# Patient Record
Sex: Female | Born: 1965
Health system: Southern US, Community
[De-identification: ages and names within clinical notes are randomized; demographics above are authoritative.]

## PROBLEM LIST (undated history)

## (undated) DIAGNOSIS — K219 Gastro-esophageal reflux disease without esophagitis: Secondary | ICD-10-CM

## (undated) DIAGNOSIS — I10 Essential (primary) hypertension: Secondary | ICD-10-CM

## (undated) DIAGNOSIS — M2550 Pain in unspecified joint: Secondary | ICD-10-CM

## (undated) DIAGNOSIS — D649 Anemia, unspecified: Secondary | ICD-10-CM

## (undated) DIAGNOSIS — K589 Irritable bowel syndrome without diarrhea: Secondary | ICD-10-CM

## (undated) DIAGNOSIS — M549 Dorsalgia, unspecified: Secondary | ICD-10-CM

## (undated) DIAGNOSIS — L600 Ingrowing nail: Secondary | ICD-10-CM

## (undated) DIAGNOSIS — R7303 Prediabetes: Secondary | ICD-10-CM

## (undated) DIAGNOSIS — F419 Anxiety disorder, unspecified: Secondary | ICD-10-CM

## (undated) DIAGNOSIS — R600 Localized edema: Secondary | ICD-10-CM

## (undated) DIAGNOSIS — F988 Other specified behavioral and emotional disorders with onset usually occurring in childhood and adolescence: Secondary | ICD-10-CM

## (undated) DIAGNOSIS — T7840XA Allergy, unspecified, initial encounter: Secondary | ICD-10-CM

## (undated) DIAGNOSIS — T783XXA Angioneurotic edema, initial encounter: Secondary | ICD-10-CM

## (undated) DIAGNOSIS — E739 Lactose intolerance, unspecified: Secondary | ICD-10-CM

## (undated) DIAGNOSIS — E669 Obesity, unspecified: Secondary | ICD-10-CM

## (undated) DIAGNOSIS — I493 Ventricular premature depolarization: Secondary | ICD-10-CM

## (undated) DIAGNOSIS — M1990 Unspecified osteoarthritis, unspecified site: Secondary | ICD-10-CM

## (undated) DIAGNOSIS — E785 Hyperlipidemia, unspecified: Secondary | ICD-10-CM

## (undated) DIAGNOSIS — M940 Chondrocostal junction syndrome [Tietze]: Secondary | ICD-10-CM

## (undated) DIAGNOSIS — J069 Acute upper respiratory infection, unspecified: Secondary | ICD-10-CM

## (undated) DIAGNOSIS — B49 Unspecified mycosis: Secondary | ICD-10-CM

## (undated) DIAGNOSIS — L509 Urticaria, unspecified: Secondary | ICD-10-CM

## (undated) DIAGNOSIS — Z0000 Encounter for general adult medical examination without abnormal findings: Secondary | ICD-10-CM

## (undated) DIAGNOSIS — G4700 Insomnia, unspecified: Secondary | ICD-10-CM

## (undated) HISTORY — DX: Lactose intolerance, unspecified: E73.9

## (undated) HISTORY — DX: Ventricular premature depolarization: I49.3

## (undated) HISTORY — DX: Hyperlipidemia, unspecified: E78.5

## (undated) HISTORY — DX: Prediabetes: R73.03

## (undated) HISTORY — DX: Angioneurotic edema, initial encounter: T78.3XXA

## (undated) HISTORY — DX: Insomnia, unspecified: G47.00

## (undated) HISTORY — DX: Allergy, unspecified, initial encounter: T78.40XA

## (undated) HISTORY — DX: Localized edema: R60.0

## (undated) HISTORY — DX: Anxiety disorder, unspecified: F41.9

## (undated) HISTORY — DX: Essential (primary) hypertension: I10

## (undated) HISTORY — DX: Obesity, unspecified: E66.9

## (undated) HISTORY — DX: Dorsalgia, unspecified: M54.9

## (undated) HISTORY — DX: Urticaria, unspecified: L50.9

## (undated) HISTORY — DX: Pain in unspecified joint: M25.50

## (undated) HISTORY — DX: Unspecified osteoarthritis, unspecified site: M19.90

## (undated) HISTORY — DX: Chondrocostal junction syndrome (tietze): M94.0

## (undated) HISTORY — DX: Anemia, unspecified: D64.9

## (undated) HISTORY — DX: Irritable bowel syndrome, unspecified: K58.9

## (undated) HISTORY — DX: Other specified behavioral and emotional disorders with onset usually occurring in childhood and adolescence: F98.8

## (undated) HISTORY — DX: Encounter for general adult medical examination without abnormal findings: Z00.00

## (undated) HISTORY — DX: Acute upper respiratory infection, unspecified: J06.9

## (undated) HISTORY — PX: WISDOM TOOTH EXTRACTION: SHX21

## (undated) HISTORY — DX: Unspecified mycosis: B49

## (undated) HISTORY — PX: OTHER SURGICAL HISTORY: SHX169

## (undated) HISTORY — DX: Ingrowing nail: L60.0

---

## 1999-08-15 ENCOUNTER — Other Ambulatory Visit: Admission: RE | Admit: 1999-08-15 | Discharge: 1999-08-15 | Payer: Self-pay | Admitting: Obstetrics and Gynecology

## 2000-07-17 ENCOUNTER — Emergency Department (HOSPITAL_COMMUNITY): Admission: EM | Admit: 2000-07-17 | Discharge: 2000-07-17 | Payer: Self-pay | Admitting: Emergency Medicine

## 2000-09-18 ENCOUNTER — Other Ambulatory Visit: Admission: RE | Admit: 2000-09-18 | Discharge: 2000-09-18 | Payer: Self-pay | Admitting: Obstetrics and Gynecology

## 2001-10-01 ENCOUNTER — Other Ambulatory Visit: Admission: RE | Admit: 2001-10-01 | Discharge: 2001-10-01 | Payer: Self-pay | Admitting: Obstetrics and Gynecology

## 2002-10-15 ENCOUNTER — Other Ambulatory Visit: Admission: RE | Admit: 2002-10-15 | Discharge: 2002-10-15 | Payer: Self-pay | Admitting: Obstetrics and Gynecology

## 2003-12-21 ENCOUNTER — Other Ambulatory Visit: Admission: RE | Admit: 2003-12-21 | Discharge: 2003-12-21 | Payer: Self-pay | Admitting: Obstetrics and Gynecology

## 2004-01-03 ENCOUNTER — Encounter: Payer: Self-pay | Admitting: Internal Medicine

## 2004-01-03 LAB — CONVERTED CEMR LAB

## 2004-09-15 ENCOUNTER — Ambulatory Visit: Payer: Self-pay | Admitting: Internal Medicine

## 2004-10-26 ENCOUNTER — Ambulatory Visit: Payer: Self-pay | Admitting: Internal Medicine

## 2004-11-10 ENCOUNTER — Ambulatory Visit: Payer: Self-pay | Admitting: Internal Medicine

## 2004-11-17 ENCOUNTER — Ambulatory Visit: Payer: Self-pay | Admitting: Internal Medicine

## 2005-01-03 ENCOUNTER — Other Ambulatory Visit: Admission: RE | Admit: 2005-01-03 | Discharge: 2005-01-03 | Payer: Self-pay | Admitting: Obstetrics and Gynecology

## 2005-01-30 ENCOUNTER — Ambulatory Visit: Payer: Self-pay | Admitting: Internal Medicine

## 2005-06-21 ENCOUNTER — Ambulatory Visit: Payer: Self-pay | Admitting: Internal Medicine

## 2005-08-10 ENCOUNTER — Ambulatory Visit: Payer: Self-pay | Admitting: Internal Medicine

## 2005-09-07 ENCOUNTER — Ambulatory Visit: Payer: Self-pay | Admitting: Internal Medicine

## 2005-12-14 ENCOUNTER — Ambulatory Visit: Payer: Self-pay | Admitting: Internal Medicine

## 2006-07-15 ENCOUNTER — Ambulatory Visit: Payer: Self-pay | Admitting: Internal Medicine

## 2006-08-13 ENCOUNTER — Ambulatory Visit: Payer: Self-pay | Admitting: Internal Medicine

## 2006-09-11 ENCOUNTER — Ambulatory Visit: Payer: Self-pay | Admitting: Internal Medicine

## 2006-11-01 ENCOUNTER — Ambulatory Visit: Payer: Self-pay | Admitting: Endocrinology

## 2007-02-27 ENCOUNTER — Ambulatory Visit: Payer: Self-pay | Admitting: Internal Medicine

## 2007-03-26 ENCOUNTER — Encounter: Payer: Self-pay | Admitting: Internal Medicine

## 2007-03-26 DIAGNOSIS — J309 Allergic rhinitis, unspecified: Secondary | ICD-10-CM | POA: Insufficient documentation

## 2007-03-26 DIAGNOSIS — I10 Essential (primary) hypertension: Secondary | ICD-10-CM | POA: Insufficient documentation

## 2007-03-26 DIAGNOSIS — I4949 Other premature depolarization: Secondary | ICD-10-CM | POA: Insufficient documentation

## 2007-06-06 ENCOUNTER — Ambulatory Visit: Payer: Self-pay | Admitting: Internal Medicine

## 2007-09-11 ENCOUNTER — Ambulatory Visit: Payer: Self-pay | Admitting: Internal Medicine

## 2007-09-11 DIAGNOSIS — J069 Acute upper respiratory infection, unspecified: Secondary | ICD-10-CM | POA: Insufficient documentation

## 2007-09-22 LAB — CONVERTED CEMR LAB
BUN: 7 mg/dL (ref 6–23)
Chloride: 102 meq/L (ref 96–112)
Creatinine, Ser: 0.8 mg/dL (ref 0.4–1.2)
GFR calc non Af Amer: 85 mL/min
Potassium: 3.8 meq/L (ref 3.5–5.1)

## 2007-10-17 ENCOUNTER — Ambulatory Visit: Payer: Self-pay | Admitting: Internal Medicine

## 2007-10-17 DIAGNOSIS — J018 Other acute sinusitis: Secondary | ICD-10-CM | POA: Insufficient documentation

## 2008-03-25 ENCOUNTER — Ambulatory Visit: Payer: Self-pay | Admitting: Internal Medicine

## 2008-03-25 DIAGNOSIS — K644 Residual hemorrhoidal skin tags: Secondary | ICD-10-CM | POA: Insufficient documentation

## 2008-04-02 ENCOUNTER — Telehealth: Payer: Self-pay | Admitting: Internal Medicine

## 2008-04-16 ENCOUNTER — Ambulatory Visit: Payer: Self-pay | Admitting: Internal Medicine

## 2008-04-16 LAB — CONVERTED CEMR LAB
Calcium: 9.2 mg/dL (ref 8.4–10.5)
Glucose, Bld: 88 mg/dL (ref 70–99)
Potassium: 3.8 meq/L (ref 3.5–5.3)
Sodium: 143 meq/L (ref 135–145)

## 2008-04-21 ENCOUNTER — Ambulatory Visit: Payer: Self-pay | Admitting: Internal Medicine

## 2008-06-04 ENCOUNTER — Ambulatory Visit: Payer: Self-pay | Admitting: Internal Medicine

## 2008-06-04 DIAGNOSIS — J060 Acute laryngopharyngitis: Secondary | ICD-10-CM | POA: Insufficient documentation

## 2008-10-14 ENCOUNTER — Ambulatory Visit: Payer: Self-pay | Admitting: Internal Medicine

## 2008-10-14 LAB — CONVERTED CEMR LAB
BUN: 7 mg/dL (ref 6–23)
Calcium: 9.1 mg/dL (ref 8.4–10.5)
Cholesterol: 150 mg/dL (ref 0–200)
Creatinine, Ser: 0.5 mg/dL (ref 0.4–1.2)
GFR calc Af Amer: 176 mL/min
GFR calc non Af Amer: 145 mL/min
Glucose, Bld: 93 mg/dL (ref 70–99)
HDL: 46.9 mg/dL (ref >39.0)
LDL Cholesterol: 74 mg/dL (ref 0–99)
Potassium: 3.7 meq/L (ref 3.5–5.1)
VLDL: 29 mg/dL (ref 0–40)

## 2008-10-29 ENCOUNTER — Ambulatory Visit: Payer: Self-pay | Admitting: Internal Medicine

## 2008-10-29 DIAGNOSIS — E669 Obesity, unspecified: Secondary | ICD-10-CM | POA: Insufficient documentation

## 2008-11-26 ENCOUNTER — Ambulatory Visit: Payer: Self-pay | Admitting: Internal Medicine

## 2008-11-26 DIAGNOSIS — E1169 Type 2 diabetes mellitus with other specified complication: Secondary | ICD-10-CM | POA: Insufficient documentation

## 2008-11-26 DIAGNOSIS — E669 Obesity, unspecified: Secondary | ICD-10-CM | POA: Insufficient documentation

## 2009-01-11 LAB — CONVERTED CEMR LAB: Pap Smear: NORMAL

## 2009-01-20 ENCOUNTER — Ambulatory Visit: Payer: Self-pay | Admitting: Internal Medicine

## 2009-02-07 ENCOUNTER — Telehealth (INDEPENDENT_AMBULATORY_CARE_PROVIDER_SITE_OTHER): Payer: Self-pay | Admitting: *Deleted

## 2009-05-25 ENCOUNTER — Encounter: Payer: Self-pay | Admitting: Internal Medicine

## 2009-07-14 ENCOUNTER — Ambulatory Visit: Payer: Self-pay | Admitting: Internal Medicine

## 2009-07-14 LAB — CONVERTED CEMR LAB
BUN: 8 mg/dL (ref 6–23)
Calcium: 8.9 mg/dL (ref 8.4–10.5)
Cholesterol: 138 mg/dL (ref 0–200)
Creatinine, Ser: 0.74 mg/dL (ref 0.40–1.20)
Glucose, Bld: 77 mg/dL (ref 70–99)
Potassium: 4.2 meq/L (ref 3.5–5.3)
Triglycerides: 115 mg/dL (ref <150)
VLDL: 23 mg/dL (ref 0–40)

## 2009-07-21 ENCOUNTER — Ambulatory Visit: Payer: Self-pay | Admitting: Internal Medicine

## 2009-07-22 ENCOUNTER — Telehealth: Payer: Self-pay | Admitting: Internal Medicine

## 2009-08-04 ENCOUNTER — Encounter: Payer: Self-pay | Admitting: Internal Medicine

## 2009-08-05 ENCOUNTER — Encounter: Payer: Self-pay | Admitting: Internal Medicine

## 2009-12-30 ENCOUNTER — Encounter: Payer: Self-pay | Admitting: Internal Medicine

## 2010-01-03 ENCOUNTER — Telehealth: Payer: Self-pay | Admitting: Internal Medicine

## 2010-01-05 ENCOUNTER — Encounter: Payer: Self-pay | Admitting: Internal Medicine

## 2010-01-05 LAB — CONVERTED CEMR LAB
CO2: 23 meq/L (ref 19–32)
Calcium: 9 mg/dL (ref 8.4–10.5)
Chloride: 104 meq/L (ref 96–112)
Glucose, Bld: 97 mg/dL (ref 70–99)
Potassium: 4 meq/L (ref 3.5–5.3)
Sodium: 139 meq/L (ref 135–145)

## 2010-01-12 ENCOUNTER — Ambulatory Visit: Payer: Self-pay | Admitting: Internal Medicine

## 2010-04-17 ENCOUNTER — Telehealth: Payer: Self-pay | Admitting: Internal Medicine

## 2010-04-21 ENCOUNTER — Encounter: Payer: Self-pay | Admitting: Internal Medicine

## 2010-07-26 LAB — CONVERTED CEMR LAB
BUN: 8 mg/dL (ref 6–23)
CO2: 23 meq/L (ref 19–32)
Chloride: 105 meq/L (ref 96–112)
Creatinine, Ser: 0.77 mg/dL (ref 0.40–1.20)
Potassium: 3.9 meq/L (ref 3.5–5.3)

## 2010-08-03 ENCOUNTER — Ambulatory Visit: Payer: Self-pay | Admitting: Internal Medicine

## 2010-10-05 NOTE — Letter (Signed)
Summary: Generic Letter  [place] at [facility]  [address]   [place], [place] [address]   Phone: [phone]  Fax: [phone]    01/12/2010  Brandi Lester [address] [place], [place]  [address]    To whom it may concern,   Please be advised Brandi Lester has been instructed to join Weight Watchers and participate in regular exercise program for medical reasons.  Feel free to contact our office if you have any further questions regarding this matter.          Sincerely,      Dr. Thomos Lemons [facility]

## 2010-10-05 NOTE — Assessment & Plan Note (Signed)
Summary: 6 MONTH FOLLOW UP/DK   Vital Signs:  Patient profile:   45 year old female Height:      70 inches Weight:      288.50 pounds BMI:     41.55 Temp:     98.3 degrees F oral Pulse rate:   88 / minute Resp:     18 per minute BP sitting:   104 / 74  (right arm) Cuff size:   large  Vitals Entered By: Glendell Docker CMA (August 03, 2010 3:35 PM) CC: 6 month follow up Is Patient Diabetic? Yes Pain Assessment Patient in pain? no        Primary Care Provider:  Dondra Spry DO  CC:  6 month follow up.  History of Present Illness:  45 y/o AA female with hx of htn and obesity for f/u pt also has hx of borderline DM and we have been monitoring A1c recent A1c worse at 6.7 she has tried metformin in the past but stopped due to GI side effects  she is trying to exercise regularly - started water aerobics mild left wrist pain - not sure if injury occurred during water aerobics  Preventive Screening-Counseling & Management  Alcohol-Tobacco     Smoking Status: quit  Allergies: 1)  ! 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   Eyes:  pupils equal, pupils round, and pupils reactive to light.   Lungs:  normal respiratory effort and normal breath sounds.   Heart:  normal rate, regular rhythm, and no gallop.   Extremities:  No lower extremity edema    Impression & Recommendations:  Problem # 1:  DIABETES MELLITUS, TYPE II (ICD-250.00) Assessment Deteriorated pt could not tolerate metformin  in the past due to GI side effects A1c worse 6.7 trial of victoza Pt counseled on diet and exercise. carb counting handout provided pt to limit to 25 grams of carb per meal  Her updated medication list for this problem includes:    Diovan Hct 160-12.5 Mg Tabs (Valsartan-hydrochlorothiazide) ..... One by mouth qd    Victoza 18 Mg/81ml Soln (Liraglutide) ..... Inject 1.2 mg once daily  Problem # 2:  HYPERTENSION (ICD-401.9) Assessment: Unchanged  Her updated medication list for this problem includes:    Diovan Hct 160-12.5 Mg Tabs (Valsartan-hydrochlorothiazide) ..... One by mouth qd  BP today: 104/74 Prior BP: 122/70 (01/12/2010)  Labs Reviewed: K+: 3.9 (07/26/2010) Creat: : 0.77 (07/26/2010)   Chol: 138 (07/14/2009)   HDL: 50 (07/14/2009)   LDL: 65 (07/14/2009)   TG: 115 (07/14/2009)  Complete Medication List: 1)  Diovan Hct 160-12.5 Mg Tabs (Valsartan-hydrochlorothiazide) .... One by mouth qd 2)  Yaz 3-0.02 Mg Tabs (Drospirenone-ethinyl estradiol) .... Take 1 tablet by mouth once a day 3)  Zyrtec Allergy 10 Mg Tabs (Cetirizine hcl) .... Take 1 tablet by mouth once a day 4)  Nasonex 50 Mcg/act Susp (Mometasone furoate) .... 2 sprays  each nostril once daily 5)  Azelastine Hcl 137 Mcg/spray Soln (Azelastine hcl) .... 2 sprays each nostril two times a day as needed 6)  Victoza 18 Mg/49ml Soln (Liraglutide) .... Inject 1.2 mg once daily 7)  Pen Needles 5/16" 31g X 8 Mm Misc (Insulin pen needle) .... Use once daily as directed  Other Orders: Flu Vaccine 106yrs + ([address]) Admin 1st Vaccine ([address]41)  Patient Instructions: 1)  Please schedule a follow-up appointment in 3 months. Prescriptions: PEN NEEDLES 5/16" 31G X 8 MM MISC (INSULIN PEN NEEDLE) use once daily as directed  #50 x 3   Entered and Authorized by:   D. Thomos Lemons DO   Signed by:   D. Thomos Lemons DO on 08/03/2010   Method used:   Print then Give to Patient   RxID:   [id number] VICTOZA 18 MG/3ML SOLN (LIRAGLUTIDE)  inject 1.2 mg once daily  #1 month x 2   Entered and Authorized by:   D. Thomos Lemons DO   Signed by:   D. Thomos Lemons DO on 08/03/2010   Method used:   Print then Give to Patient   RxID:   [id number]    Orders Added: 1)  Flu Vaccine 20yrs + [33295] 2)  Admin 1st Vaccine [90471] 3)  Est. Patient Level III [18841]   Immunizations Administered:  Influenza Vaccine # 1:    Vaccine Type: Fluvax 3+    Site: left deltoid    Mfr: GlaxoSmithKline    Dose: 0.5 ml    Route: IM    Given by: Glendell Docker CMA    Exp. Date: 03/03/2011    Lot #: [id number]    VIS given: 03/28/10 version given August 04, 2010.  Flu Vaccine Consent Questions:    Do you have a history of severe allergic reactions to this vaccine? no    Any prior history of allergic reactions to egg and/or gelatin? no    Do you have a sensitivity to the preservative Thimersol? no    Do you have a past history of Guillan-Barre Syndrome? no    Do you currently have an acute febrile illness? no    Have you ever had a severe reaction to latex? no    Vaccine information given and explained to patient? yes    Are you currently pregnant? no   Immunizations Administered:  Influenza Vaccine # 1:    Vaccine Type: Fluvax 3+    Site: left deltoid    Mfr: GlaxoSmithKline    Dose: 0.5 ml    Route: IM    Given by: Glendell Docker CMA    Exp. Date: 03/03/2011    Lot #: [id number]    VIS given: 03/28/10 version given August 04, 2010.   Preventive Care Screening  Mammogram:    Date:  02/20/2010    Results:  normal   Pap Smear:    Date:  02/20/2010    Results:  normal    Current Allergies (reviewed today): ! PENICILLIN

## 2010-10-05 NOTE — Assessment & Plan Note (Signed)
Summary: 6 MONTH FOLLOW UP/MHF   Vital Signs:  Patient profile:   45 year old female Height:      70 inches Weight:      288.50 pounds BMI:     41.55 O2 Sat:      100 % on Room air Temp:     98.4 degrees F oral Pulse rate:   100 / minute Pulse rhythm:   regular Resp:     18 per minute BP sitting:   122 / 70  (right arm) Cuff size:   large  Vitals Entered By: Glendell Docker CMA (Jan 12, 2010 3:31 PM)  O2 Flow:  Room air CC: Rm 2- 6 Month Follow up disease management Comments note for weight watchers for inusrance purposes  fro flex pay, nasal spray for allergy Astepro   Primary Care Provider:  Dondra Spry DO  CC:  Rm 2- 6 Month Follow up disease management.  History of Present Illness:  Hypertension Follow-Up      This is a 45 year old woman who presents for Hypertension follow-up.  The patient denies lightheadedness.  Associated symptoms include exercise intolerance.  The patient denies the following associated symptoms: chest pain.  Compliance with medications (by patient report) has been near 100%.  The patient reports that dietary compliance has been good.  The patient reports exercising 3-4X per week.    She has history of allergic rhinitis and requests refill of nasal steroid and nasal antihistamine. She has tolerated well without any side effects.  Allergies: 1)  ! 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   Head:  normocephalic and atraumatic.   Neck:  supple and no masses.  no carotid  bruits.   Lungs:  normal respiratory effort and normal breath sounds.   Heart:  normal rate, regular rhythm, and no gallop.   Extremities:  No lower extremity edema  Neurologic:  cranial nerves II-XII intact and gait normal.     Impression & Recommendations:  Problem # 1:  HYPERTENSION (ICD-401.9) Assessment Unchanged blood pressure is well controlled.  Monitor electrolytes and kidney function Her updated medication list for this problem includes:    Diovan Hct 160-12.5 Mg Tabs (Valsartan-hydrochlorothiazide) ..... One by mouth qd  Future Orders: T-Basic Metabolic Panel [id number]) ... 07/24/2010 T- Hemoglobin A1C ([id number]) ... 07/24/2010  BP today: 122/70 Prior BP: 110/74 (07/21/2009)  Labs Reviewed: K+: 4.0 (01/05/2010) Creat: : 0.78 (01/05/2010)   Chol: 138 (07/14/2009)   HDL: 50 (07/14/2009)   LDL: 65 (07/14/2009)   TG: 115 (07/14/2009)  Problem # 2:  OBESITY, UNSPECIFIED (ICD-278.00) patient able to  loose 4 pounds since previous office visit.  she has started exercise program and Weight Watchers.  Problem # 3:  DIABETES MELLITUS, TYPE II, BORDERLINE (ICD-790.29) Assessment: Improved A1c has normalized to 5.6. Discontinue metformin. Continue lifestyle changes The following medications were removed from the medication list:    Metformin Hcl 500 Mg Tabs (  Metformin hcl) ..... One by mouth bid  Future Orders: T- Hemoglobin A1C ([id number]) ... 07/24/2010  Complete Medication List: 1)  Diovan Hct 160-12.5 Mg Tabs (Valsartan-hydrochlorothiazide) .... One by mouth qd 2)  Yaz 3-0.02 Mg Tabs (Drospirenone-ethinyl estradiol) .... Take 1 tablet by mouth once a day 3)  Zyrtec Allergy 10 Mg Tabs (Cetirizine hcl) .... Take 1 tablet by mouth once a day 4)  Nasonex 50 Mcg/act Susp (Mometasone furoate) .... 2 sprays each nostril once daily 5)  Azelastine Hcl 137 Mcg/spray Soln (Azelastine hcl) .... 2 sprays each nostril two times a day as needed 6)  Omeprazole 20 Mg Cpdr  (Omeprazole) .... Take 1 tablet by mouth qd  Patient Instructions: 1)  Please schedule a follow-up appointment in 6 months. 2)  BMP prior to visit, ICD-9: 401.9 3)  HbgA1C prior to visit, ICD-9: 790.29 4)  Please return for lab work one (1) week before your next appointment.  Prescriptions: NASONEX 50 MCG/ACT SUSP (MOMETASONE FUROATE) 2 sprays each nostril once daily  #1 x 3   Entered and Authorized by:   D. Thomos Lemons DO   Signed by:   D. Thomos Lemons DO on 01/12/2010   Method used:   Electronically to        [facility] (retail)       [address]       [place], [place]  [address]       Ph: [phone]       Fax: [phone]   RxID:   [id number] AZELASTINE HCL 137 MCG/SPRAY SOLN (AZELASTINE HCL) 2 sprays each nostril two times a day as needed  #1 x 3   Entered and Authorized by:   D. Thomos Lemons DO   Signed by:   D. Thomos Lemons DO on 01/12/2010   Method used:   Electronically to        [facility] (retail)       [address]       [place], [place]  [address]       Ph: [phone]       Fax: [phone]   RxID:   [id number]   Current Allergies (reviewed today): ! PENICILLIN

## 2010-10-05 NOTE — Progress Notes (Signed)
Summary: Labwork question  Phone Note Call from Patient Call back at [phone]   Caller: Patient Summary of Call: Pt needs to know if she can eat before her 01/05/10 labwork Initial call taken by: [name],  Jan 03, 2010 10:31 AM  Follow-up for Phone Call        Left message on machine that pt. needs to be fasting for labwork appt.  [name] CMA  Jan 03, 2010 11:44 AM

## 2010-10-05 NOTE — Miscellaneous (Signed)
Summary: Lab Orders  Clinical Lists Changes  Orders: Added new Test order of T-Basic Metabolic Panel ([id number]) - Signed Added new Test order of T- Hemoglobin A1C ([id number]) - Signed 

## 2010-10-05 NOTE — Medication Information (Signed)
Summary: Interaction Between [facility]  Interaction Between [facility]   Imported By: Lanelle Bal 01/09/2010 13:07:07  _____________________________________________________________________  External Attachment:    Type:   Image     Comment:   External Document

## 2010-10-05 NOTE — Progress Notes (Signed)
Summary: Problems with [facility]  Phone Note Call from Patient Call back at [phone]   Caller: Patient Summary of Call: Pt does not want ANY Rx's sent to [facility], she is having problems with them, pls send all future Rx's to [facility], she said there will probably be a refill request coming in from [facility] today, do not send it back, pls send Rx to [facility] Initial call taken by: [name],  April 17, 2010 4:20 PM  Follow-up for Phone Call        noted Follow-up by: Glendell Docker CMA,  April 18, 2010 8:22 AM

## 2010-10-05 NOTE — Letter (Signed)
[facility]   Imported By: Lanelle Bal 05/05/2010 10:40:18  _____________________________________________________________________  External Attachment:    Type:   Image     Comment:   External Document

## 2010-11-03 ENCOUNTER — Ambulatory Visit (INDEPENDENT_AMBULATORY_CARE_PROVIDER_SITE_OTHER): Payer: BC Managed Care – PPO | Admitting: Internal Medicine

## 2010-11-03 ENCOUNTER — Encounter: Payer: Self-pay | Admitting: Internal Medicine

## 2010-11-03 DIAGNOSIS — I10 Essential (primary) hypertension: Secondary | ICD-10-CM

## 2010-11-03 DIAGNOSIS — E119 Type 2 diabetes mellitus without complications: Secondary | ICD-10-CM

## 2010-11-03 DIAGNOSIS — R110 Nausea: Secondary | ICD-10-CM

## 2010-11-03 LAB — CONVERTED CEMR LAB
BUN: 8 mg/dL (ref 6–23)
Chloride: 104 meq/L (ref 96–112)
Creatinine, Urine: 145.5 mg/dL
Glucose, Bld: 94 mg/dL (ref 70–99)
Hgb A1c MFr Bld: 5.8 % — ABNORMAL HIGH (ref <5.7)
Potassium: 3.5 meq/L (ref 3.5–5.3)
Sodium: 137 meq/L (ref 135–145)

## 2010-11-06 ENCOUNTER — Encounter: Payer: Self-pay | Admitting: Internal Medicine

## 2010-11-09 NOTE — Assessment & Plan Note (Signed)
Summary: 3 Month Follow up    Vital Signs:  Patient profile:   45 year old female Height:      70 inches Weight:      275.25 pounds BMI:     39.64 O2 Sat:      93 % on Room air Temp:     98.5 degrees F oral Pulse rate:   93 / minute Resp:     18 per minute BP sitting:   114 / 70  (left arm) Cuff size:   large  Vitals Entered By: Glendell Docker CMA (November 03, 2010 4:01 PM)  O2 Flow:  Room air CC: 3 Month Follow up  Is Patient Diabetic? Yes Pain Assessment Patient in pain? no        Primary Care Provider:  Dondra Spry DO  CC:  3 Month Follow up .  History of Present Illness: 45 year old female for followup Patient was recently seen at urgent care and diagnosed with strep throat She has mild residual symptoms despite completing antibiotics She denies neck pain or fever or chills. She has penicillin allergy.  Patient treated with azithromycin. Patient complains of mild left ear pain  Diabetes mellitus type 2-patient has lost approximately 15 pounds since previous visit The patient experienced some nausea with eating heavy meals while taking victoza She denies vomiting She denies severe abdominal pain  hypertension-stable  Preventive Screening-Counseling & Management  Alcohol-Tobacco     Smoking Status: quit  Allergies: 1)  ! Penicillin  Past History:  Past Medical History: Allergic rhinitis  Hypertension   Obesity       DM II   Past Surgical History: LASIK eye surgery           Family History: Sister with massive MI at age 68 Multiple siblings with hypertension Mother deceased secondary to MI age 45 Father deceased with history of prostate cancer          Physical Exam  General:  alert, well-developed, and well-nourished.   Ears:  left and right tympanic membranes normal Mouth:  pharyngeal erythema, no exudates.   Neck:  No deformities, masses, or tenderness noted. Lungs:  normal respiratory effort and normal breath sounds.   Heart:  normal  rate, regular rhythm, and no gallop.     Impression & Recommendations:  Problem # 1:  DIABETES MELLITUS, TYPE II (ICD-250.00) Assessment Improved patient having mild nausea with those. This is improved with dietary adjustment.   Rule out pancreatitis  Her updated medication list for this problem includes:    Diovan Hct 160-12.5 Mg Tabs (Valsartan-hydrochlorothiazide) ..... One by mouth qd    Victoza 18 Mg/91ml Soln (Liraglutide) ..... Inject 1.2 mg once daily  Orders: T-Basic Metabolic Panel [id number]) T- Hemoglobin A1C ([id number]) T-Urine Microalbumin w/creat. ratio [id number])  Problem # 2:  HYPERTENSION (ICD-401.9) Assessment: Unchanged  Her updated medication list for this problem includes:    Diovan Hct 160-12.5 Mg Tabs (Valsartan-hydrochlorothiazide) ..... One by mouth qd  BP today: 114/70 Prior BP: 104/74 (08/03/2010)  Labs Reviewed: K+: 3.9 (07/26/2010) Creat: : 0.77 (07/26/2010)   Chol: 138 (07/14/2009)   HDL: 50 (07/14/2009)   LDL: 65 (07/14/2009)   TG: 115 (07/14/2009)  Problem # 3:  NAUSEA (ICD-787.02)  Orders: T-Lipase ([id number])  Problem # 4:  ACUTE LARYNGOPHARYNGITIS (ICD-465.0) Patient recently seen in urgent care for strep throat Rapid strep is negative Patient not having any fever or neck pain Continue symptomatic care-gargle with warm salt water twice a day Patient advised  to call office if symptoms persist or worsen.  Complete Medication List: 1)  Diovan Hct 160-12.5 Mg Tabs (Valsartan-hydrochlorothiazide) .... One by mouth qd 2)  Yaz 3-0.02 Mg Tabs (Drospirenone-ethinyl estradiol) .... Take 1 tablet by mouth once a day 3)  Zyrtec Allergy 10 Mg Tabs (Cetirizine hcl) .... Take 1 tablet by mouth once a day 4)  Nasonex 50 Mcg/act Susp (Mometasone furoate) .... 2 sprays each nostril once daily 5)  Azelastine Hcl 137 Mcg/spray Soln (Azelastine hcl) .... 2 sprays each nostril two times a day as needed 6)  Victoza 18 Mg/54ml Soln  (Liraglutide) .... Inject 1.2 mg once daily 7)  Pen Needles 5/16" 31g X 8 Mm Misc (Insulin pen needle) .... Use once daily as directed  Patient Instructions: 1)  Please schedule a follow-up appointment in 4 months. 2)  BMP prior to visit, ICD-9: 401.9 3)  HbgA1C prior to visit, ICD-9: 250.00 4)  High sensitivity CRP :  250.00 5)  Please return for lab work one (1) week before your next appointment.  Prescriptions: PEN NEEDLES 5/16" 31G X 8 MM MISC (INSULIN PEN NEEDLE) use once daily as directed  #100 x 1   Entered and Authorized by:   D. Thomos Lemons DO   Signed by:   D. Thomos Lemons DO on 11/03/2010   Method used:   Electronically to        [facility] (retail)       [address]       [place], [place]  [address]       Ph: [phone]       Fax: [phone]   RxID:   [id number] VICTOZA 18 MG/3ML SOLN (LIRAGLUTIDE) inject 1.2 mg once daily  #1 month x 5   Entered and Authorized by:   D. Thomos Lemons DO   Signed by:   D. Thomos Lemons DO on 11/03/2010   Method used:   Electronically to        [facility] (retail)       [address]       [place], [place]  [address]       Ph: [phone]       Fax: [phone]   RxID:   [id number] DIOVAN HCT 160-12.5 MG TABS (VALSARTAN-HYDROCHLOROTHIAZIDE) one by mouth qd  #90 Tablet x 1   Entered and Authorized by:   D. Thomos Lemons DO   Signed by:   D. Thomos Lemons DO on 11/03/2010   Method used:   Electronically to        [facility] (retail)       [address]       [place], [place]  [address]       Ph: [phone]       Fax: [phone]   RxID:   [id number]    Orders Added: 1)  T-Basic Metabolic Panel [id number] 2)  T- Hemoglobin A1C [[id number]] 3)  T-Urine Microalbumin w/creat. ratio [[id number]] 4)  T-Lipase [[address]3690-23215] 5)  Est. Patient Level III [82993]    Current Allergies (reviewed today): !  PENICILLIN

## 2010-11-14 NOTE — Letter (Signed)
   [place] at [facility] [address] [place], [place]  [address]  [place] Phone: [phone]      November 06, 2010   [facility] [address] [place], [place] [address]  RE:  LAB RESULTS  Dear  Ms. Decarli,  The following is an interpretation of your most recent lab tests.  Please take note of any instructions provided or changes to medications that have resulted from your lab work.  ELECTROLYTES:  Good - no changes needed  KIDNEY FUNCTION TESTS:  Good - no changes needed   DIABETIC STUDIES:  Improved - continue management Blood Glucose: 94   HgbA1C: 5.8   Microalbumin/Creatinine Ratio: 3.4          Sincerely Yours,    Dr. Thomos Lemons  Appended Document:  mailed

## 2011-01-30 ENCOUNTER — Ambulatory Visit (INDEPENDENT_AMBULATORY_CARE_PROVIDER_SITE_OTHER): Payer: BC Managed Care – PPO | Admitting: Family

## 2011-01-30 ENCOUNTER — Encounter: Payer: Self-pay | Admitting: Family

## 2011-01-30 DIAGNOSIS — H6690 Otitis media, unspecified, unspecified ear: Secondary | ICD-10-CM

## 2011-01-30 DIAGNOSIS — H6692 Otitis media, unspecified, left ear: Secondary | ICD-10-CM

## 2011-01-30 DIAGNOSIS — J329 Chronic sinusitis, unspecified: Secondary | ICD-10-CM | POA: Insufficient documentation

## 2011-01-30 MED ORDER — LEVOFLOXACIN 500 MG PO TABS: 500.0000 mg | ORAL_TABLET | Freq: Every day | ORAL | Status: AC

## 2011-01-30 NOTE — Assessment & Plan Note (Signed)
Failed a z-pack.  Will treat with levaquin.

## 2011-01-30 NOTE — Assessment & Plan Note (Signed)
Will treat with levaquin.  I suspect her "vertigo" will resolve once OM is resolved.

## 2011-01-30 NOTE — Progress Notes (Signed)
  Subjective:    Patient ID: Brandi Brandi Lester, female    DOB: 1966/05/18, 45 y.o.   MRN: [id number]  HPI Ms.  Brandi Lester is a 45 yr old female who presents today with chief complaint of left ear fullness/difficulty hearing out of the left ear.  Symptoms started last Tuesday.  [facility] and treated with z-pak.  She completed abx on Saturday.  Still having pain/pressure- tooth pain and left ear fullness.  She has also been taking zyrtec D without improvement.  Now having "vertigo."     Review of Systems    see HPI  Past Medical History  Diagnosis Date  . Allergy     allergic rhinitis  . Hypertension   . Diabetes mellitus     Type II  . Obesity     History   Social History  . Marital Status: Single    Spouse Name: N/A    Number of Children: N/A  . Years of Education: N/A   Occupational History  . Not on file.   Social History Main Topics  . Smoking status: Former Games developer  . Smokeless tobacco: Not on file  . Alcohol Use: Yes     rare  . Drug Use: Not on file  . Sexually Active: Not on file   Other Topics Concern  . Not on file   Social History Narrative  . No narrative on file    Past Surgical History  Procedure Date  . Lasik     eye surgery    Family History  Problem Relation Age of Onset  . Heart attack Mother 49  . Cancer Father     prostate  . Heart attack Sister 41  . Hypertension Other     Allergies  Allergen Reactions  . Penicillins     REACTION: lip tingling    No current outpatient prescriptions on file prior to visit.    BP 100/60  Pulse 84  Temp(Src) 97.9 F (36.6 C) (Oral)  Resp 16  Wt 266 lb 1.3 oz (120.693 kg)  SpO2 99%    Objective:   Physical Exam Gen: awake, alert, NAD Head:  + frontal and maxillary tenderness to palpation- worst over the left frontal area.  Ears: R TM normal, left TM pink and bulging.   Neck: supple without cervical adenopathy or JVC Cv: s1/s2, rrr Resp: BS CTA  bilaterally, no wheezes/rales or rhonchi       Assessment & Plan:

## 2011-01-30 NOTE — Patient Instructions (Addendum)
Call if your symptoms worsen or if they are not improved in 48-72 hours.  

## 2011-02-05 ENCOUNTER — Telehealth: Payer: Self-pay | Admitting: Internal Medicine

## 2011-02-05 DIAGNOSIS — H6690 Otitis media, unspecified, unspecified ear: Secondary | ICD-10-CM

## 2011-02-05 NOTE — Telephone Encounter (Signed)
Pt states congestion has cleared but dizziness is the same and still has popping sound and fullness in her left ear. Thinks her ear may be only slightly better.  Still has 1 antibiotic left to take. Please advise.

## 2011-02-05 NOTE — Telephone Encounter (Signed)
She is still dizzy and still has a clicking in her ear and cant really hear out of the left ear.  She has one more antibiotic pill left what should she do

## 2011-02-05 NOTE — Telephone Encounter (Signed)
Please let patient know that I am referring her to ENT.

## 2011-02-05 NOTE — Telephone Encounter (Signed)
Left detailed message re: instructions below an to call if any questions.

## 2011-03-05 ENCOUNTER — Ambulatory Visit: Payer: BC Managed Care – PPO | Admitting: Internal Medicine

## 2011-03-08 ENCOUNTER — Other Ambulatory Visit: Payer: Self-pay | Admitting: *Deleted

## 2011-03-08 DIAGNOSIS — I10 Essential (primary) hypertension: Secondary | ICD-10-CM

## 2011-03-08 DIAGNOSIS — E119 Type 2 diabetes mellitus without complications: Secondary | ICD-10-CM

## 2011-03-08 LAB — BASIC METABOLIC PANEL
BUN: 11 mg/dL (ref 6–23)
CO2: 25 mEq/L (ref 19–32)
Calcium: 9.2 mg/dL (ref 8.4–10.5)
Creat: 0.77 mg/dL (ref 0.50–1.10)
Glucose, Bld: 86 mg/dL (ref 70–99)

## 2011-03-09 LAB — HEMOGLOBIN A1C: Hgb A1c MFr Bld: 5.9 % — ABNORMAL HIGH (ref <5.7)

## 2011-03-12 ENCOUNTER — Other Ambulatory Visit: Payer: Self-pay | Admitting: Internal Medicine

## 2011-03-15 ENCOUNTER — Ambulatory Visit (INDEPENDENT_AMBULATORY_CARE_PROVIDER_SITE_OTHER): Payer: BC Managed Care – PPO | Admitting: Internal Medicine

## 2011-03-15 ENCOUNTER — Encounter: Payer: Self-pay | Admitting: Internal Medicine

## 2011-03-15 ENCOUNTER — Ambulatory Visit: Payer: BC Managed Care – PPO | Admitting: Internal Medicine

## 2011-03-15 DIAGNOSIS — E119 Type 2 diabetes mellitus without complications: Secondary | ICD-10-CM

## 2011-03-15 DIAGNOSIS — E669 Obesity, unspecified: Secondary | ICD-10-CM

## 2011-03-15 DIAGNOSIS — R42 Dizziness and giddiness: Secondary | ICD-10-CM

## 2011-03-15 DIAGNOSIS — I10 Essential (primary) hypertension: Secondary | ICD-10-CM

## 2011-03-15 MED ORDER — MECLIZINE HCL 25 MG PO TABS: 25.0000 mg | ORAL_TABLET | Freq: Three times a day (TID) | ORAL | Status: AC | PRN

## 2011-03-15 NOTE — Patient Instructions (Signed)
Please schedule cbc, chem7, a1c, tsh (250.0) and lipid,lft, crp (272.0) prior to next visit

## 2011-03-18 DIAGNOSIS — R42 Dizziness and giddiness: Secondary | ICD-10-CM | POA: Insufficient documentation

## 2011-03-18 NOTE — Assessment & Plan Note (Addendum)
Excellent control. Encouraged weight loss.obtain fasting lipid profile with next labs

## 2011-03-18 NOTE — Assessment & Plan Note (Signed)
Long discussion held. Encouraged efforts for dietary modification, regular aerobic exercise and weight loss.

## 2011-03-18 NOTE — Assessment & Plan Note (Signed)
Normotensive and stable. Continue current regimen. 

## 2011-03-18 NOTE — Assessment & Plan Note (Signed)
Resolved. Antivert in future if needed. Prescription provided

## 2011-03-18 NOTE — Progress Notes (Signed)
  Subjective:    Patient ID: Brandi Lester, female    DOB: 02-Feb-1966, 45 y.o.   MRN: [id number]  HPI Patient presents to clinic for evaluation of multiple medical problems. Her pulse sinus infection and may involve the left ear. Took 2 courses of antibiotics and developed  vertigo. Ultimately  saw ENT who prescribed Nasonex. Vertigo resolved. Wonders what she can take if has future episodes. Blood pressure reviewed under good control. Uses Victoza for diabetes and A1c reviewed under excellent control. CRP elevated however no corresponding lipid profile. Not currently on statin therapy. No other complaints.  Reviewed past medical history, medications and allergies    Review of Systems  Constitutional: Negative for fever, chills and fatigue.  HENT: Negative for hearing loss, ear pain and tinnitus.   Neurological: Negative for dizziness and light-headedness.       Objective:   Physical Exam    Physical Exam  Vitals reviewed. Constitutional:  appears well-developed and well-nourished. No distress.  HENT:  Head: Normocephalic and atraumatic.  Right Ear: Tympanic membrane, external ear and ear canal normal.  Left Ear: Tympanic membrane, external ear and ear canal normal.  Nose: Nose normal.  Mouth/Throat: Oropharynx is clear and moist. No oropharyngeal exudate.  Eyes: Conjunctivae and EOM are normal. Pupils are equal, round, and reactive to light. Right eye exhibits no discharge. Left eye exhibits no discharge. No scleral icterus.  Neck: Neck supple. No thyromegaly present.  Cardiovascular: Normal rate, regular rhythm and normal heart sounds.  Exam reveals no gallop and no friction rub.   No murmur heard. Pulmonary/Chest: Effort normal and breath sounds normal. No respiratory distress.  has no wheezes.  has no rales.  Lymphadenopathy:   no cervical adenopathy.  Neurological:  is alert.  Skin: Skin is warm and dry.  not diaphoretic.  Psychiatric: normal mood and affect.       Assessment & Plan:

## 2011-06-07 ENCOUNTER — Other Ambulatory Visit: Payer: Self-pay | Admitting: Internal Medicine

## 2011-06-07 NOTE — Telephone Encounter (Signed)
Rx refill sent to pharmacy. 

## 2011-06-14 ENCOUNTER — Other Ambulatory Visit: Payer: Self-pay | Admitting: Internal Medicine

## 2011-06-14 DIAGNOSIS — E7800 Pure hypercholesterolemia, unspecified: Secondary | ICD-10-CM

## 2011-06-14 DIAGNOSIS — E119 Type 2 diabetes mellitus without complications: Secondary | ICD-10-CM

## 2011-06-14 LAB — BASIC METABOLIC PANEL
Calcium: 9.1 mg/dL (ref 8.4–10.5)
Glucose, Bld: 90 mg/dL (ref 70–99)
Potassium: 3.6 mEq/L (ref 3.5–5.3)
Sodium: 141 mEq/L (ref 135–145)

## 2011-06-14 LAB — CBC
Hemoglobin: 12.8 g/dL (ref 12.0–15.0)
MCH: 28.6 pg (ref 26.0–34.0)
MCHC: 34.3 g/dL (ref 30.0–36.0)
Platelets: 276 10*3/uL (ref 150–400)

## 2011-06-14 LAB — HEPATIC FUNCTION PANEL
ALT: 12 U/L (ref 0–35)
AST: 13 U/L (ref 0–37)
Alkaline Phosphatase: 69 U/L (ref 39–117)
Bilirubin, Direct: 0.1 mg/dL (ref 0.0–0.3)
Indirect Bilirubin: 0.3 mg/dL (ref 0.0–0.9)

## 2011-06-14 LAB — HEMOGLOBIN A1C
Hgb A1c MFr Bld: 5.9 % — ABNORMAL HIGH (ref <5.7)
Mean Plasma Glucose: 123 mg/dL — ABNORMAL HIGH (ref <117)

## 2011-06-14 LAB — LIPID PANEL
Cholesterol: 151 mg/dL (ref 0–200)
Total CHOL/HDL Ratio: 4.3 Ratio

## 2011-06-14 LAB — TSH: TSH: 0.679 u[IU]/mL (ref 0.350–4.500)

## 2011-06-19 ENCOUNTER — Ambulatory Visit (INDEPENDENT_AMBULATORY_CARE_PROVIDER_SITE_OTHER): Payer: BC Managed Care – PPO | Admitting: Internal Medicine

## 2011-06-19 ENCOUNTER — Encounter: Payer: Self-pay | Admitting: Internal Medicine

## 2011-06-19 DIAGNOSIS — H9209 Otalgia, unspecified ear: Secondary | ICD-10-CM

## 2011-06-19 DIAGNOSIS — E119 Type 2 diabetes mellitus without complications: Secondary | ICD-10-CM

## 2011-06-23 DIAGNOSIS — H9209 Otalgia, unspecified ear: Secondary | ICD-10-CM | POA: Insufficient documentation

## 2011-06-23 NOTE — Progress Notes (Signed)
  Subjective:    Patient ID: Brandi Lester, female    DOB: 1966-05-10, 45 y.o.   MRN: [id number]  HPI Pt presents to clinic for followup of multiple medical problems. Notes 3d h/o left ear pain without trauma or drainage. Had lg temperature 99.4. +clear nasal drainage. H/o mild dm using victoza. Has questions about possible long term side effects from medication. Has not lost wt with medication. Last two a1c's 5.9. Does not have glucometer to check fsbs. Chart review indicates dx of dm ~ 1 year ago with a1c 6.7 and nl fasting glucoses. Otherwise a1c's have ranged 5.6-6.0. No other complaints.  Past Medical History  Diagnosis Date  . Allergy     allergic rhinitis  . Hypertension   . Diabetes mellitus     Type II  . Obesity    Past Surgical History  Procedure Date  . Lasik     eye surgery    reports that she has quit smoking. She has never used smokeless tobacco. She reports that she drinks alcohol. Her drug history not on file. family history includes Cancer in her father; Heart attack (age of onset:52) in her sister; Heart attack (age of onset:65) in her mother; and Hypertension in her other. Allergies  Allergen Reactions  . Penicillins     REACTION: lip tingling     Review of Systems see hpi     Objective:   Physical Exam  Nursing note and vitals reviewed. Constitutional: She appears well-developed and well-nourished. No distress.  HENT:  Head: Normocephalic and atraumatic.  Right Ear: Tympanic membrane, external ear and ear canal normal.  Left Ear: Tympanic membrane, external ear and ear canal normal.  Nose: Nose normal.  Mouth/Throat: Oropharynx is clear and moist. No oropharyngeal exudate.  Eyes: Conjunctivae are normal. No scleral icterus.  Neck: Neck supple.  Cardiovascular: Normal rate, regular rhythm and normal heart sounds.  Exam reveals no gallop and no friction rub.   No murmur heard. Pulmonary/Chest: Effort normal and breath sounds normal. No respiratory  distress. She has no wheezes. She has no rales.  Neurological: She is alert.  Skin: Skin is warm and dry. She is not diaphoretic.       Diabetic foot exam: no diabetic wounds, ulceration or significant callousing. +2 dp pulse bilaterally. Monofilament nl bilaterally.  Psychiatric: She has a normal mood and affect.          Assessment & Plan:

## 2011-06-23 NOTE — Assessment & Plan Note (Signed)
Decrease victoza to 0.6 and if glucose remains under good control then dc. Teach fsbs. Prescription for glucometer/test strips/lancet for fsbs fasting qam. Strongly encourage diabetic diet, regular aerobic exercise and wt loss.

## 2011-06-23 NOTE — Assessment & Plan Note (Signed)
Most c/w eustacian tube dysfxn. Treat underlying drainage with AH. Followup if no improvement or worsening.

## 2011-07-17 ENCOUNTER — Ambulatory Visit: Payer: BC Managed Care – PPO | Admitting: Internal Medicine

## 2011-07-24 ENCOUNTER — Ambulatory Visit (INDEPENDENT_AMBULATORY_CARE_PROVIDER_SITE_OTHER): Payer: BC Managed Care – PPO | Admitting: Internal Medicine

## 2011-07-24 ENCOUNTER — Encounter: Payer: Self-pay | Admitting: Internal Medicine

## 2011-07-24 VITALS — BP 120/80 | HR 72 | Temp 97.9°F | Resp 16 | Wt 289.1 lb

## 2011-07-24 DIAGNOSIS — E119 Type 2 diabetes mellitus without complications: Secondary | ICD-10-CM

## 2011-07-24 NOTE — Patient Instructions (Signed)
Please schedule chem7, a1c 250.0 prior to next appointment 

## 2011-07-28 NOTE — Progress Notes (Signed)
  Subjective:    Patient ID: Brandi Lester, female    DOB: [birth year], 46 y.o.   MRN: [id number]  HPI Pt presents to clinic for followup of multiple medical problems. Has made diet changes and victoza dose reduced to 0.6. Last a1c prior to changes was 5.9. Previously intolerant of metformin. BP reviewed as normotensive. Previous ear pain last visit now resolved. No active complaint.  Past Medical History  Diagnosis Date  . Allergy     allergic rhinitis  . Hypertension   . Diabetes mellitus     Type II  . Obesity    Past Surgical History  Procedure Date  . Lasik     eye surgery    reports that she has quit smoking. She has never used smokeless tobacco. She reports that she drinks alcohol. Her drug history not on file. family history includes Cancer in her father; Heart attack (age of onset:52) in her sister; Heart attack (age of onset:65) in her mother; and Hypertension in her other. Allergies  Allergen Reactions  . Penicillins     REACTION: lip tingling     Review of Systems see hpi     Objective:   Physical Exam  Nursing note and vitals reviewed. Constitutional: She appears well-developed and well-nourished. No distress.  HENT:  Head: Normocephalic and atraumatic.  Neurological: She is alert.  Skin: She is not diaphoretic.  Psychiatric: She has a normal mood and affect.          Assessment & Plan:

## 2011-07-28 NOTE — Assessment & Plan Note (Signed)
DC victoza (no wt loss and pt concerned over potential side effects). Continue dietary modification and exercise. Monitor fsbs and if becomes hyperglycemic consider low dose [place] or onglyza.

## 2011-08-17 ENCOUNTER — Other Ambulatory Visit: Payer: Self-pay | Admitting: Internal Medicine

## 2011-08-17 NOTE — Telephone Encounter (Signed)
Rx refill sent to pharmacy. 

## 2011-09-17 ENCOUNTER — Encounter: Payer: Self-pay | Admitting: Internal Medicine

## 2011-09-17 ENCOUNTER — Ambulatory Visit (INDEPENDENT_AMBULATORY_CARE_PROVIDER_SITE_OTHER): Payer: BC Managed Care – PPO | Admitting: Internal Medicine

## 2011-09-17 ENCOUNTER — Encounter: Payer: Self-pay | Admitting: *Deleted

## 2011-09-17 VITALS — BP 126/84 | HR 99 | Temp 98.9°F | Resp 18 | Wt 287.0 lb

## 2011-09-17 DIAGNOSIS — R509 Fever, unspecified: Secondary | ICD-10-CM

## 2011-09-17 DIAGNOSIS — K5289 Other specified noninfective gastroenteritis and colitis: Secondary | ICD-10-CM

## 2011-09-17 DIAGNOSIS — J069 Acute upper respiratory infection, unspecified: Secondary | ICD-10-CM

## 2011-09-17 DIAGNOSIS — K529 Noninfective gastroenteritis and colitis, unspecified: Secondary | ICD-10-CM

## 2011-09-17 MED ORDER — PROMETHAZINE HCL 25 MG PO TABS: 25.0000 mg | ORAL_TABLET | Freq: Four times a day (QID) | ORAL | Status: AC | PRN

## 2011-09-21 DIAGNOSIS — K529 Noninfective gastroenteritis and colitis, unspecified: Secondary | ICD-10-CM | POA: Insufficient documentation

## 2011-09-21 NOTE — Assessment & Plan Note (Signed)
Flu swab neg. Suspect viral etiology. Maintain adequate po fluid intake.  Attempt phenergan prn-cautioned re possible sedation. Work note provided. Followup if no improvement or worsening.

## 2011-09-21 NOTE — Progress Notes (Signed)
  Subjective:    Patient ID: Brandi Lester, female    DOB: Feb 22, 1966, 46 y.o.   MRN: [id number]  HPI Pt presents to clinic for evaluation of vomitting. Notes 2 day h/o vomitting, abdominal cramping and loose watery diarrhea without blood. Has fever with tmax 101 and head congestion. Taking no medication for the problem. No alleviating or exacerbating factors. No other complaints.  Past Medical History  Diagnosis Date  . Allergy     allergic rhinitis  . Hypertension   . Diabetes mellitus     Type II  . Obesity    Past Surgical History  Procedure Date  . Lasik     eye surgery    reports that she has quit smoking. She has never used smokeless tobacco. She reports that she drinks alcohol. Her drug history not on file. family history includes Cancer in her father; Heart attack (age of onset:52) in her sister; Heart attack (age of onset:65) in her mother; and Hypertension in her other. Allergies  Allergen Reactions  . Penicillins     REACTION: lip tingling     Review of Systems see hpi     Objective:   Physical Exam  Nursing note and vitals reviewed. Constitutional: She appears well-developed and well-nourished. No distress.  HENT:  Head: Normocephalic.  Mouth/Throat: Oropharynx is clear and moist. No oropharyngeal exudate.  Eyes: Conjunctivae are normal. No scleral icterus.  Abdominal: Soft. Bowel sounds are normal. She exhibits no distension and no mass. There is no tenderness. There is no rebound and no guarding.  Neurological: She is alert.  Skin: Skin is warm and dry. She is not diaphoretic.  Psychiatric: She has a normal mood and affect.          Assessment & Plan:

## 2011-09-24 ENCOUNTER — Other Ambulatory Visit: Payer: Self-pay | Admitting: Internal Medicine

## 2011-09-24 NOTE — Telephone Encounter (Signed)
Rx refill sent to pharmacy. 

## 2011-09-25 ENCOUNTER — Ambulatory Visit (INDEPENDENT_AMBULATORY_CARE_PROVIDER_SITE_OTHER): Payer: BC Managed Care – PPO | Admitting: Internal Medicine

## 2011-09-25 ENCOUNTER — Encounter: Payer: Self-pay | Admitting: Internal Medicine

## 2011-09-25 VITALS — BP 124/70 | HR 103 | Temp 98.5°F | Resp 20

## 2011-09-25 DIAGNOSIS — J329 Chronic sinusitis, unspecified: Secondary | ICD-10-CM

## 2011-09-25 MED ORDER — DOXYCYCLINE HYCLATE 100 MG PO TABS: 100.0000 mg | ORAL_TABLET | Freq: Two times a day (BID) | ORAL | Status: AC

## 2011-09-29 DIAGNOSIS — J329 Chronic sinusitis, unspecified: Secondary | ICD-10-CM | POA: Insufficient documentation

## 2011-09-29 NOTE — Progress Notes (Signed)
  Subjective:    Patient ID: Brandi Lester, female    DOB: 1966-04-13, 46 y.o.   MRN: [id number]  HPI Pt presents to clinic for evaluation of nasal congestion. Note ~10d h/o yellow nasal drainage, congestion, headaches and sinus pain. Taking mucinex with some improvement. Uses nasacort and netti pot. No other alleviating or exacerbating factors. No other complaints.  Past Medical History  Diagnosis Date  . Allergy     allergic rhinitis  . Hypertension   . Diabetes mellitus     Type II  . Obesity    Past Surgical History  Procedure Date  . Lasik     eye surgery    reports that she has quit smoking. She has never used smokeless tobacco. She reports that she drinks alcohol. Her drug history not on file. family history includes Cancer in her father; Heart attack (age of onset:52) in her sister; Heart attack (age of onset:65) in her mother; and Hypertension in her other. Allergies  Allergen Reactions  . Penicillins     REACTION: lip tingling     Review of Systems see hpi      Objective:   Physical Exam  Nursing note and vitals reviewed. Constitutional: She appears well-developed and well-nourished. No distress.  HENT:  Head: Normocephalic and atraumatic.  Right Ear: Tympanic membrane, external ear and ear canal normal.  Left Ear: Tympanic membrane and external ear normal.  Nose: Right sinus exhibits maxillary sinus tenderness.  Mouth/Throat: Oropharynx is clear and moist. No oropharyngeal exudate.  Eyes: Conjunctivae are normal. No scleral icterus.  Neck: Neck supple.  Skin: She is not diaphoretic.          Assessment & Plan:

## 2011-09-29 NOTE — Assessment & Plan Note (Signed)
Attempt abx course. Followup if no improvement or worsening.  

## 2011-10-25 ENCOUNTER — Ambulatory Visit: Payer: BC Managed Care – PPO | Admitting: Internal Medicine

## 2011-11-09 ENCOUNTER — Ambulatory Visit: Payer: BC Managed Care – PPO | Admitting: Internal Medicine

## 2011-11-29 ENCOUNTER — Other Ambulatory Visit: Payer: Self-pay | Admitting: Internal Medicine

## 2011-11-29 DIAGNOSIS — E119 Type 2 diabetes mellitus without complications: Secondary | ICD-10-CM

## 2011-11-30 LAB — BASIC METABOLIC PANEL
BUN: 9 mg/dL (ref 6–23)
Calcium: 9.2 mg/dL (ref 8.4–10.5)
Creat: 0.7 mg/dL (ref 0.50–1.10)
Glucose, Bld: 82 mg/dL (ref 70–99)

## 2011-11-30 LAB — HEMOGLOBIN A1C: Hgb A1c MFr Bld: 6.2 % — ABNORMAL HIGH (ref <5.7)

## 2011-12-07 ENCOUNTER — Ambulatory Visit (INDEPENDENT_AMBULATORY_CARE_PROVIDER_SITE_OTHER): Payer: BC Managed Care – PPO | Admitting: Internal Medicine

## 2011-12-07 ENCOUNTER — Telehealth: Payer: Self-pay | Admitting: Internal Medicine

## 2011-12-07 ENCOUNTER — Encounter: Payer: Self-pay | Admitting: Internal Medicine

## 2011-12-07 VITALS — BP 130/82 | HR 87 | Temp 98.9°F | Resp 18 | Ht 70.0 in | Wt 292.0 lb

## 2011-12-07 DIAGNOSIS — Z13220 Encounter for screening for lipoid disorders: Secondary | ICD-10-CM

## 2011-12-07 DIAGNOSIS — E119 Type 2 diabetes mellitus without complications: Secondary | ICD-10-CM

## 2011-12-07 DIAGNOSIS — IMO0001 Reserved for inherently not codable concepts without codable children: Secondary | ICD-10-CM

## 2011-12-07 DIAGNOSIS — M7918 Myalgia, other site: Secondary | ICD-10-CM

## 2011-12-07 DIAGNOSIS — I10 Essential (primary) hypertension: Secondary | ICD-10-CM

## 2011-12-07 MED ORDER — VALSARTAN-HYDROCHLOROTHIAZIDE 160-12.5 MG PO TABS: 1.0000 | ORAL_TABLET | Freq: Every day | ORAL | Status: DC

## 2011-12-07 NOTE — Assessment & Plan Note (Signed)
Good control off medication. Focus on diabetic diet, exercise and wt loss.

## 2011-12-07 NOTE — Assessment & Plan Note (Signed)
Attempt aleve with food and no other nsaids. Followup if no improvement or worsening.

## 2011-12-07 NOTE — Progress Notes (Signed)
  Subjective:    Patient ID: Brandi Lester, female    DOB: 12-May-1966, 46 y.o.   MRN: [id number]  HPI Pt presents to clinic for followup of multiple medical problems. Recently stepped wrong on a treadmill while exercising and pulled right low back and right knee. Did not fall. Occurred ~1 month ago. No radicular pain or knee instability. Continues to exercise regularly. Diabetic control reviewed at target despite dc of victoza last visit.   Past Medical History  Diagnosis Date  . Allergy     allergic rhinitis  . Hypertension   . Diabetes mellitus     Type II  . Obesity    Past Surgical History  Procedure Date  . Lasik     eye surgery    reports that she has quit smoking. She has never used smokeless tobacco. She reports that she drinks alcohol. Her drug history not on file. family history includes Cancer in her father; Heart attack (age of onset:52) in her sister; Heart attack (age of onset:65) in her mother; and Hypertension in her other. Allergies  Allergen Reactions  . Penicillins     REACTION: lip tingling      Review of Systems see hpi     Objective:   Physical Exam  Physical Exam  Nursing note and vitals reviewed. Constitutional: Appears well-developed and well-nourished. No distress.  HENT:  Head: Normocephalic and atraumatic.  Right Ear: External ear normal.  Left Ear: External ear normal.  Eyes: Conjunctivae are normal. No scleral icterus.  Neck: Neck supple. Carotid bruit is not present.  Cardiovascular: Normal rate, regular rhythm and normal heart sounds.  Exam reveals no gallop and no friction rub.   No murmur heard. Pulmonary/Chest: Effort normal and breath sounds normal. No respiratory distress. He has no wheezes. no rales.  Lymphadenopathy:    He has no cervical adenopathy.  Neurological:Alert.  Skin: Skin is warm and dry. Not diaphoretic.  Psychiatric: Has a normal mood and affect.        Assessment & Plan:

## 2011-12-07 NOTE — Telephone Encounter (Signed)
Lab orders entered for August 2013. 

## 2011-12-07 NOTE — Patient Instructions (Signed)
Please schedule fasting labs prior to next visit Cbc, chem7, a1c, urine microalbumin, lipid 250.0

## 2011-12-07 NOTE — Assessment & Plan Note (Signed)
Normotensive and stable. Continue current regimen. Monitor bp as outpt and followup in clinic as scheduled.  

## 2012-04-07 LAB — CBC
MCH: 28.6 pg (ref 26.0–34.0)
MCHC: 35.3 g/dL (ref 30.0–36.0)
MCV: 81 fL (ref 78.0–100.0)
Platelets: 294 10*3/uL (ref 150–400)
RBC: 4.48 MIL/uL (ref 3.87–5.11)
RDW: 14.4 % (ref 11.5–15.5)

## 2012-04-07 LAB — BASIC METABOLIC PANEL
CO2: 27 mEq/L (ref 19–32)
Calcium: 9.1 mg/dL (ref 8.4–10.5)
Creat: 0.66 mg/dL (ref 0.50–1.10)
Sodium: 140 mEq/L (ref 135–145)

## 2012-04-07 NOTE — Addendum Note (Signed)
Addended by: [name] A on: 04/07/2012 11:03 AM   Modules accepted: Orders

## 2012-04-07 NOTE — Telephone Encounter (Signed)
Pt presented to the lab, orders released. 

## 2012-04-08 LAB — LIPID PANEL
LDL Cholesterol: 91 mg/dL (ref 0–99)
Triglycerides: 111 mg/dL (ref <150)
VLDL: 22 mg/dL (ref 0–40)

## 2012-04-08 LAB — HEMOGLOBIN A1C
Hgb A1c MFr Bld: 5.9 % — ABNORMAL HIGH (ref <5.7)
Mean Plasma Glucose: 123 mg/dL — ABNORMAL HIGH (ref <117)

## 2012-04-08 LAB — MICROALBUMIN / CREATININE URINE RATIO
Creatinine, Urine: 331.6 mg/dL
Microalb, Ur: 1.1 mg/dL (ref 0.00–1.89)

## 2012-04-11 ENCOUNTER — Encounter: Payer: Self-pay | Admitting: Internal Medicine

## 2012-04-11 ENCOUNTER — Ambulatory Visit (INDEPENDENT_AMBULATORY_CARE_PROVIDER_SITE_OTHER): Payer: BC Managed Care – PPO | Admitting: Internal Medicine

## 2012-04-11 VITALS — BP 124/88 | HR 104 | Temp 98.7°F | Resp 16 | Wt 300.5 lb

## 2012-04-11 DIAGNOSIS — E669 Obesity, unspecified: Secondary | ICD-10-CM

## 2012-04-11 DIAGNOSIS — I10 Essential (primary) hypertension: Secondary | ICD-10-CM

## 2012-04-11 DIAGNOSIS — E119 Type 2 diabetes mellitus without complications: Secondary | ICD-10-CM

## 2012-04-11 NOTE — Patient Instructions (Signed)
Please schedule labs prior to next visit Chem7, a1c-250.0 

## 2012-04-11 NOTE — Progress Notes (Signed)
  Subjective:    Patient ID: Brandi Lester, female    DOB: 02-15-1966, 46 y.o.   MRN: [id number]  HPI Pt presents to clinic for followup of multiple medical problems. Is attempting diet change and regular exercise. Weight up since last visit. Wishes to begin weight watchers program. Blood sugar control and hdl chol improved. No active complaint.  Past Medical History  Diagnosis Date  . Allergy     allergic rhinitis  . Hypertension   . Diabetes mellitus     Type II  . Obesity    Past Surgical History  Procedure Date  . Lasik     eye surgery    reports that she has quit smoking. She has never used smokeless tobacco. She reports that she drinks alcohol. Her drug history not on file. family history includes Cancer in her father; Heart attack (age of onset:52) in her sister; Heart attack (age of onset:65) in her mother; and Hypertension in her other. Allergies  Allergen Reactions  . Penicillins     REACTION: lip tingling      Review of Systems see hpi     Objective:   Physical Exam  Physical Exam  Nursing note and vitals reviewed. Constitutional: Appears well-developed and well-nourished. No distress.  HENT:  Head: Normocephalic and atraumatic.  Right Ear: External ear normal.  Left Ear: External ear normal.  Eyes: Conjunctivae are normal. No scleral icterus.  Neck: Neck supple. Carotid bruit is not present.  Cardiovascular: Normal rate, regular rhythm and normal heart sounds.  Exam reveals no gallop and no friction rub.   No murmur heard. Pulmonary/Chest: Effort normal and breath sounds normal. No respiratory distress. He has no wheezes. no rales.  Lymphadenopathy:    He has no cervical adenopathy.  Neurological:Alert.  Skin: Skin is warm and dry. Not diaphoretic.  Psychiatric: Has a normal mood and affect.        Assessment & Plan:

## 2012-04-12 NOTE — Assessment & Plan Note (Signed)
Normotensive and stable. Continue current regimen. Monitor bp as outpt and followup in clinic as scheduled.  

## 2012-04-12 NOTE — Assessment & Plan Note (Signed)
Improving and excellent control. Weight loss attempts encouraged.

## 2012-04-12 NOTE — Assessment & Plan Note (Signed)
Letter provided for weight watcher's program to fall under healthcare flex spending account

## 2012-06-03 DIAGNOSIS — B49 Unspecified mycosis: Secondary | ICD-10-CM

## 2012-06-03 DIAGNOSIS — L600 Ingrowing nail: Secondary | ICD-10-CM | POA: Insufficient documentation

## 2012-06-03 HISTORY — DX: Unspecified mycosis: B49

## 2012-06-03 HISTORY — DX: Ingrowing nail: L60.0

## 2012-08-04 ENCOUNTER — Telehealth: Payer: Self-pay | Admitting: *Deleted

## 2012-08-04 DIAGNOSIS — E119 Type 2 diabetes mellitus without complications: Secondary | ICD-10-CM

## 2012-08-04 LAB — BASIC METABOLIC PANEL
Calcium: 9 mg/dL (ref 8.4–10.5)
Glucose, Bld: 91 mg/dL (ref 70–99)
Potassium: 4 mEq/L (ref 3.5–5.3)
Sodium: 139 mEq/L (ref 135–145)

## 2012-08-04 LAB — HEMOGLOBIN A1C
Hgb A1c MFr Bld: 5.9 % — ABNORMAL HIGH (ref <5.7)
Mean Plasma Glucose: 123 mg/dL — ABNORMAL HIGH (ref <117)

## 2012-08-04 NOTE — Telephone Encounter (Signed)
Pt presented to the lab. Orders entered per 04/2012 office note as below.  Please schedule labs prior to next visit  Chem7, a1c-250.0

## 2012-08-11 ENCOUNTER — Encounter: Payer: Self-pay | Admitting: Internal Medicine

## 2012-08-11 ENCOUNTER — Ambulatory Visit (INDEPENDENT_AMBULATORY_CARE_PROVIDER_SITE_OTHER): Payer: BC Managed Care – PPO | Admitting: Internal Medicine

## 2012-08-11 VITALS — BP 132/88 | HR 108 | Temp 98.6°F | Resp 12 | Wt 299.1 lb

## 2012-08-11 DIAGNOSIS — I10 Essential (primary) hypertension: Secondary | ICD-10-CM

## 2012-08-11 DIAGNOSIS — Z23 Encounter for immunization: Secondary | ICD-10-CM

## 2012-08-11 DIAGNOSIS — E119 Type 2 diabetes mellitus without complications: Secondary | ICD-10-CM

## 2012-08-11 MED ORDER — INFLUENZA VIRUS VACC SPLIT PF IM SUSP: 0.5000 mL | INTRAMUSCULAR | Status: DC

## 2012-08-11 NOTE — Assessment & Plan Note (Signed)
Normotensive and stable. Continue current regimen. Monitor bp as outpt and followup in clinic as scheduled.  

## 2012-08-11 NOTE — Progress Notes (Signed)
  Subjective:    Patient ID: Brandi Lester, female    DOB: Sep 20, 1965, 46 y.o.   MRN: [id number]  HPI Pt presents to clinic for followup of multiple medical problems. Reviewed a1c stable at 5.9. Weight down one pound since last visit. Was exercising however had two separate foot problems that temporarily derailed her efforts. Plans on resuming exercise soon.   Past Medical History  Diagnosis Date  . Allergy     allergic rhinitis  . Hypertension   . Diabetes mellitus     Type II  . Obesity   . Fungus infection 10/13    Left great toe  . Ingrown nail 10/13    right foot next to the last toe   Past Surgical History  Procedure Date  . Lasik     eye surgery    reports that she has quit smoking. She has never used smokeless tobacco. She reports that she drinks alcohol. Her drug history not on file. family history includes Cancer in her father; Heart attack (age of onset:52) in her sister; Heart attack (age of onset:65) in her mother; and Hypertension in her other. Allergies  Allergen Reactions  . Penicillins     REACTION: lip tingling      Review of Systems see hpi     Objective:   Physical Exam  Physical Exam  Nursing note and vitals reviewed. Constitutional: Appears well-developed and well-nourished. No distress.  HENT:  Head: Normocephalic and atraumatic.  Right Ear: External ear normal.  Left Ear: External ear normal.  Eyes: Conjunctivae are normal. No scleral icterus.  Neck: Neck supple. Carotid bruit is not present.  Cardiovascular: Normal rate, regular rhythm and normal heart sounds.  Exam reveals no gallop and no friction rub.   No murmur heard. Pulmonary/Chest: Effort normal and breath sounds normal. No respiratory distress. He has no wheezes. no rales.  Lymphadenopathy:    He has no cervical adenopathy.  Neurological:Alert.  Skin: Skin is warm and dry. Not diaphoretic.  Psychiatric: Has a normal mood and affect.  Diabetic foot exam: +2 DP pulses, no  diabetic wounds, ulcerations or significant callousing. Monofilament exam nl.       Assessment & Plan:

## 2012-08-11 NOTE — Assessment & Plan Note (Signed)
Good control. Encouraged exercise and weight loss.

## 2012-08-20 ENCOUNTER — Ambulatory Visit (INDEPENDENT_AMBULATORY_CARE_PROVIDER_SITE_OTHER): Payer: BC Managed Care – PPO | Admitting: Family

## 2012-08-20 ENCOUNTER — Encounter: Payer: Self-pay | Admitting: Family

## 2012-08-20 VITALS — BP 118/78 | HR 107 | Temp 98.6°F | Resp 16 | Wt 299.0 lb

## 2012-08-20 DIAGNOSIS — J329 Chronic sinusitis, unspecified: Secondary | ICD-10-CM

## 2012-08-20 MED ORDER — AZITHROMYCIN 250 MG PO TABS: ORAL_TABLET | ORAL | Status: DC

## 2012-08-20 NOTE — Patient Instructions (Addendum)

## 2012-08-20 NOTE — Assessment & Plan Note (Signed)
Will rx with zithromax.   

## 2012-08-20 NOTE — Progress Notes (Signed)
Subjective:    Patient ID: Brandi Lester, female    DOB: 29-Jan-1966, 46 y.o.   MRN: [id number]  HPI  Sore throat started last Thursday.  She has associated nasal drainage- yellow.  Also having diarrhea. She has associated hoarseness.  Has been using sudafed but drainage remains thick. She denies known fever.  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    Review of Systems See HPI  Past Medical History  Diagnosis Date  . Allergy     allergic rhinitis  . Hypertension   . Diabetes mellitus     Type II  . Obesity   . Fungus infection 10/13    Left great toe  . Ingrown nail 10/13    right foot next to the last toe    History   Social History  . Marital Status: Single    Spouse Name: N/A    Number of Children: N/A  . Years of Education: N/A   Occupational History  . Not on file.   Social History Main Topics  . Smoking status: Former Games developer  . Smokeless tobacco: Never Used  . Alcohol Use: Yes     Comment: rare  . Drug Use: Not on file  . Sexually Active: Not on file   Other Topics Concern  . Not on file   Social History Narrative  . No narrative on file    Past Surgical History  Procedure Date  . Lasik     eye surgery    Family History  Problem Relation Age of Onset  . Heart attack Mother 60  . Cancer Father     prostate  . Heart attack Sister 8  . Hypertension Other     Allergies  Allergen Reactions  . Penicillins     REACTION: lip tingling    Current Outpatient Prescriptions on File Prior to Visit  Medication Sig Dispense Refill  . azelastine (ASTELIN) 137 MCG/SPRAY nasal spray Place 2 sprays into both nostrils 2 (two) times daily. Use in each nostril as directed       . cetirizine (ZYRTEC) 10 MG tablet Take 10 mg by mouth daily.        [name] Epinastine HCl 0.05 % ophthalmic solution Place 1 drop into both eyes as needed.      [name] HYDROcodone-acetaminophen (NORCO) 7.5-325 MG per tablet       . mometasone (NASONEX) 50 MCG/ACT nasal  spray Place 2 sprays into the nose daily as needed.      [name] omeprazole (PRILOSEC) 20 MG capsule Take 1 capsule (20 mg total) by mouth daily.  90 capsule  1  . valsartan-hydrochlorothiazide (DIOVAN-HCT) 160-12.5 MG per tablet Take 1 tablet by mouth daily.  90 tablet  2    BP 118/78  Pulse 107  Temp 98.6 F (37 C) (Oral)  Resp 16  Wt 299 lb 0.6 oz (135.644 kg)  SpO2 97%       Objective:   Physical Exam  Constitutional: She is oriented to person, place, and time. She appears well-developed and well-nourished. No distress.  HENT:  Head: Normocephalic and atraumatic.  Right Ear: Tympanic membrane and ear canal normal.  Left Ear: Tympanic membrane and ear canal normal.  Mouth/Throat: No oropharyngeal exudate, posterior oropharyngeal edema or posterior oropharyngeal erythema.       Hoarse voice  Cardiovascular: Normal rate and regular rhythm.   No murmur heard. Pulmonary/Chest: Effort normal and breath sounds normal. No respiratory distress. She has no  wheezes. She has no rales. She exhibits no tenderness.  Musculoskeletal: She exhibits no edema.  Lymphadenopathy:    She has no cervical adenopathy.  Neurological: She is alert and oriented to person, place, and time.  Skin: Skin is warm and dry.  Psychiatric: She has a normal mood and affect. Her behavior is normal. Judgment and thought content normal.          Assessment & Plan:

## 2012-09-13 ENCOUNTER — Other Ambulatory Visit: Payer: Self-pay | Admitting: Internal Medicine

## 2012-09-15 NOTE — Telephone Encounter (Signed)
Rx to pharmacy/[facility] 

## 2012-09-24 ENCOUNTER — Other Ambulatory Visit: Payer: Self-pay | Admitting: Internal Medicine

## 2012-12-11 ENCOUNTER — Ambulatory Visit: Payer: BC Managed Care – PPO | Admitting: Family Medicine

## 2012-12-15 ENCOUNTER — Ambulatory Visit (INDEPENDENT_AMBULATORY_CARE_PROVIDER_SITE_OTHER): Payer: BC Managed Care – PPO | Admitting: Family

## 2012-12-15 ENCOUNTER — Encounter: Payer: Self-pay | Admitting: Family

## 2012-12-15 VITALS — BP 150/84 | HR 99 | Temp 98.3°F | Resp 16 | Wt 302.0 lb

## 2012-12-15 DIAGNOSIS — I10 Essential (primary) hypertension: Secondary | ICD-10-CM

## 2012-12-15 DIAGNOSIS — E119 Type 2 diabetes mellitus without complications: Secondary | ICD-10-CM

## 2012-12-15 LAB — HEMOGLOBIN A1C
Hgb A1c MFr Bld: 5.8 % — ABNORMAL HIGH (ref <5.7)
Mean Plasma Glucose: 120 mg/dL — ABNORMAL HIGH (ref <117)

## 2012-12-15 LAB — BASIC METABOLIC PANEL
BUN: 7 mg/dL (ref 6–23)
Creat: 0.79 mg/dL (ref 0.50–1.10)
Glucose, Bld: 111 mg/dL — ABNORMAL HIGH (ref 70–99)

## 2012-12-15 MED ORDER — AMLODIPINE BESYLATE 5 MG PO TABS: 5.0000 mg | ORAL_TABLET | Freq: Every day | ORAL | Status: DC

## 2012-12-15 NOTE — Patient Instructions (Addendum)
Please complete your lab work prior to leaving. Follow up as scheduled on 4/28.

## 2012-12-15 NOTE — Progress Notes (Signed)
Subjective:    Patient ID: Brandi Lester, female    DOB: 1966-06-26, 47 y.o.   MRN: [id number]  HPI  Ms.  Shugars is a 47 yr old female who presents today with chief complaint of elevated blood pressure readings. She has hx of HTN and is maintained on diovan-hct 160-12.5. Took today.  She reports 160/102 at allergist and got similar readings at home.  She reports nasal congestion (using plain allegra and flonase). She follows with Dr. Sharyn Lull. Denies CP SOB or LE edma.   Review of Systems    see HPI  Past Medical History  Diagnosis Date  . Allergy     allergic rhinitis  . Hypertension   . Diabetes mellitus     Type II  . Obesity   . Fungus infection 10/13    Left great toe  . Ingrown nail 10/13    right foot next to the last toe    History   Social History  . Marital Status: Single    Spouse Name: N/A    Number of Children: N/A  . Years of Education: N/A   Occupational History  . Not on file.   Social History Main Topics  . Smoking status: Former Games developer  . Smokeless tobacco: Never Used  . Alcohol Use: Yes     Comment: rare  . Drug Use: Not on file  . Sexually Active: Not on file   Other Topics Concern  . Not on file   Social History Narrative  . No narrative on file    Past Surgical History  Procedure Laterality Date  . Lasik      eye surgery    Family History  Problem Relation Age of Onset  . Heart attack Mother 50  . Cancer Father     prostate  . Heart attack Sister 69  . Hypertension Other     Allergies  Allergen Reactions  . Penicillins     REACTION: lip tingling    Current Outpatient Prescriptions on File Prior to Visit  Medication Sig Dispense Refill  . mometasone (NASONEX) 50 MCG/ACT nasal spray Place 2 sprays into the nose daily as needed.      [name] omeprazole (PRILOSEC) 20 MG capsule TAKE 1 CAPSULE DAILY  90 capsule  1  . valsartan-hydrochlorothiazide (DIOVAN-HCT) 160-12.5 MG per tablet TAKE 1 TABLET DAILY  90 tablet  1  . azelastine  (ASTELIN) 137 MCG/SPRAY nasal spray Place 2 sprays into both nostrils 2 (two) times daily. Use in each nostril as directed       . Epinastine HCl 0.05 % ophthalmic solution Place 1 drop into both eyes as needed.       No current facility-administered medications on file prior to visit.    BP 150/84  Pulse 99  Temp(Src) 98.3 F (36.8 C) (Oral)  Resp 16  Wt 302 lb 0.6 oz (137.004 kg)  BMI 43.34 kg/m2  SpO2 99%    Objective:   Physical Exam  Constitutional: She is oriented to person, place, and time. She appears well-developed and well-nourished. No distress.  HENT:  Head: Normocephalic and atraumatic.  Cardiovascular: Normal rate and regular rhythm.   No murmur heard. Pulmonary/Chest: Effort normal and breath sounds normal. No respiratory distress. She has no wheezes.  Musculoskeletal: She exhibits no edema.  Neurological: She is alert and oriented to person, place, and time.  Psychiatric: She has a normal mood and affect. Her behavior is normal. Judgment and thought content normal.  Assessment & Plan:

## 2012-12-16 ENCOUNTER — Encounter: Payer: Self-pay | Admitting: Family

## 2012-12-16 LAB — MICROALBUMIN / CREATININE URINE RATIO: Microalb Creat Ratio: 14.2 mg/g (ref 0.0–30.0)

## 2012-12-17 NOTE — Assessment & Plan Note (Signed)
Deteriorated. Cont diovan hct, add amlodipine.

## 2012-12-29 ENCOUNTER — Encounter: Payer: Self-pay | Admitting: Family Medicine

## 2012-12-29 ENCOUNTER — Ambulatory Visit (INDEPENDENT_AMBULATORY_CARE_PROVIDER_SITE_OTHER): Payer: BC Managed Care – PPO | Admitting: Family Medicine

## 2012-12-29 VITALS — BP 130/68 | HR 100 | Temp 98.5°F | Ht 70.0 in | Wt 302.5 lb

## 2012-12-29 DIAGNOSIS — M6580 Other synovitis and tenosynovitis, unspecified site: Secondary | ICD-10-CM

## 2012-12-29 DIAGNOSIS — I10 Essential (primary) hypertension: Secondary | ICD-10-CM

## 2012-12-29 DIAGNOSIS — E119 Type 2 diabetes mellitus without complications: Secondary | ICD-10-CM

## 2012-12-29 DIAGNOSIS — T7840XD Allergy, unspecified, subsequent encounter: Secondary | ICD-10-CM

## 2012-12-29 DIAGNOSIS — E669 Obesity, unspecified: Secondary | ICD-10-CM

## 2012-12-29 DIAGNOSIS — J309 Allergic rhinitis, unspecified: Secondary | ICD-10-CM

## 2012-12-29 DIAGNOSIS — Z5189 Encounter for other specified aftercare: Secondary | ICD-10-CM

## 2012-12-29 DIAGNOSIS — M778 Other enthesopathies, not elsewhere classified: Secondary | ICD-10-CM

## 2012-12-29 MED ORDER — NAPROXEN 375 MG PO TABS: ORAL_TABLET | ORAL | Status: DC

## 2012-12-29 MED ORDER — FEXOFENADINE HCL 180 MG PO TABS: 180.0000 mg | ORAL_TABLET | Freq: Every day | ORAL | Status: DC | PRN

## 2012-12-29 MED ORDER — AMLODIPINE BESYLATE 5 MG PO TABS: 5.0000 mg | ORAL_TABLET | Freq: Every day | ORAL | Status: DC

## 2012-12-29 MED ORDER — FLUTICASONE PROPIONATE 50 MCG/ACT NA SUSP: 2.0000 | Freq: Every day | NASAL | Status: DC | PRN

## 2012-12-29 NOTE — Assessment & Plan Note (Signed)
Improved control with addition of Amlodipine, given a 90 days supply today

## 2012-12-29 NOTE — Progress Notes (Signed)
Patient ID: Brandi Lester, female   DOB: [birth year], 47 y.o.   MRN: [id number] LAMONA EIMER [id number] 1965/11/21 12/29/2012      Progress Note-Follow Up  Subjective  Chief Complaint  Chief Complaint  Patient presents with  . Follow-up    HPI  Patient is a 47 year old [place] American female who is in today for followup. Her first complaint is right elbow pain. She says it's been going on for a week and a half. She was at work and picked up a chair and felt a popping sensation just caudal to her elbow. She said some mild swelling but no redness or warmth. Pain is worse with rotation of the arm and lifting. She has been using Aleve when necessary with pain relief temporarily. No similar previous injury. No radicular symptoms down the arm. Otherwise she feels well. She has no allergies flared and is requesting a refill on Flonase. No chest pain or palpitations. No shortness of breath GI or GU concerns noted.  Past Medical History  Diagnosis Date  . Allergy     allergic rhinitis  . Hypertension   . Diabetes mellitus     Type II  . Obesity   . Fungus infection 10/13    Left great toe  . Ingrown nail 10/13    right foot next to the last toe    Past Surgical History  Procedure Laterality Date  . Lasik      eye surgery    Family History  Problem Relation Age of Onset  . Heart attack Mother 60  . Cancer Father     prostate  . Heart attack Sister 31  . Hypertension Other     History   Social History  . Marital Status: Single    Spouse Name: N/A    Number of Children: N/A  . Years of Education: N/A   Occupational History  . Not on file.   Social History Main Topics  . Smoking status: Former Games developer  . Smokeless tobacco: Never Used  . Alcohol Use: Yes     Comment: rare  . Drug Use: Not on file  . Sexually Active: Not on file   Other Topics Concern  . Not on file   Social History Narrative  . No narrative on file    Current Outpatient Prescriptions on  File Prior to Visit  Medication Sig Dispense Refill  . azelastine (ASTELIN) 137 MCG/SPRAY nasal spray Place 2 sprays into both nostrils 2 (two) times daily. Use in each nostril as directed       . Epinastine HCl 0.05 % ophthalmic solution Place 1 drop into both eyes as needed.      [name] omeprazole (PRILOSEC) 20 MG capsule TAKE 1 CAPSULE DAILY  90 capsule  1  . valsartan-hydrochlorothiazide (DIOVAN-HCT) 160-12.5 MG per tablet TAKE 1 TABLET DAILY  90 tablet  1   No current facility-administered medications on file prior to visit.    Allergies  Allergen Reactions  . Penicillins     REACTION: lip tingling    Review of Systems  Review of Systems  Constitutional: Negative for fever and malaise/fatigue.  HENT: Positive for congestion.   Eyes: Negative for discharge.  Respiratory: Negative for shortness of breath.   Cardiovascular: Negative for chest pain, palpitations and leg swelling.  Gastrointestinal: Negative for nausea, abdominal pain and diarrhea.  Genitourinary: Negative for dysuria.  Musculoskeletal: Negative for falls.  Skin: Negative for rash.  Neurological: Negative for loss of consciousness and headaches.  Endo/Heme/Allergies: Negative for polydipsia.  Psychiatric/Behavioral: Negative for depression and suicidal ideas. The patient is not nervous/anxious and does not have insomnia.     Objective  BP 130/68  Pulse 100  Temp(Src) 98.5 F (36.9 C) (Oral)  Ht 5\' 10"  (1.778 m)  Wt 302 lb 8 oz (137.213 kg)  BMI 43.4 kg/m2  SpO2 98%  Physical Exam  Physical Exam  Constitutional: She is oriented to person, place, and time and well-developed, well-nourished, and in no distress. No distress.  HENT:  Head: Normocephalic and atraumatic.  Eyes: Conjunctivae are normal.  Neck: Neck supple. No thyromegaly present.  Cardiovascular: Normal rate, regular rhythm and normal heart sounds.   No murmur heard. Pulmonary/Chest: Effort normal and breath sounds normal. She has no wheezes.   Abdominal: She exhibits no distension and no mass.  Musculoskeletal: She exhibits no edema.  Lymphadenopathy:    She has no cervical adenopathy.  Neurological: She is alert and oriented to person, place, and time.  Skin: Skin is warm and dry. No rash noted. She is not diaphoretic.  Psychiatric: Memory, affect and judgment normal.    Lab Results  Component Value Date   TSH 0.679 06/14/2011   Lab Results  Component Value Date   WBC 6.5 04/07/2012   HGB 12.8 04/07/2012   HCT 36.3 04/07/2012   MCV 81.0 04/07/2012   PLT 294 04/07/2012   Lab Results  Component Value Date   CREATININE 0.79 12/15/2012   BUN 7 12/15/2012   NA 139 12/15/2012   K 3.5 12/15/2012   CL 102 12/15/2012   CO2 29 12/15/2012   Lab Results  Component Value Date   ALT 12 06/14/2011   AST 13 06/14/2011   ALKPHOS 69 06/14/2011   BILITOT 0.4 06/14/2011   Lab Results  Component Value Date   CHOL 152 04/07/2012   Lab Results  Component Value Date   HDL 39* 04/07/2012   Lab Results  Component Value Date   LDLCALC 91 04/07/2012   Lab Results  Component Value Date   TRIG 111 04/07/2012   Lab Results  Component Value Date   CHOLHDL 3.9 04/07/2012     Assessment & Plan  HYPERTENSION Improved control with addition of Amlodipine, given a 90 days supply today  DIABETES MELLITUS, TYPE II hgba1c 5.8, minimize simple carbs  OBESITY, UNSPECIFIED Encouraged DASH diet and increased exercise  ALLERGIC RHINITIS Given refill on Flonase and Fexofenadine today

## 2012-12-29 NOTE — Assessment & Plan Note (Signed)
Encouraged DASH diet and increased exercise 

## 2012-12-29 NOTE — Assessment & Plan Note (Signed)
hgba1c 5.8, minimize simple carbs. 

## 2012-12-29 NOTE — Assessment & Plan Note (Signed)
Given refill on Flonase and Fexofenadine today

## 2012-12-29 NOTE — Patient Instructions (Addendum)
Next visit annual, lipid, renal, hepatic, cbc, tsh, hgba1c DASH Diet The DASH diet stands for "Dietary Approaches to Stop Hypertension." It is a healthy eating plan that has been shown to reduce high blood pressure (hypertension) in as little as 14 days, while also possibly providing other significant health benefits. These other health benefits include reducing the risk of breast cancer after menopause and reducing the risk of type 2 diabetes, heart disease, colon cancer, and stroke. Health benefits also include weight loss and slowing kidney failure in patients with chronic kidney disease.  DIET GUIDELINES  Limit salt (sodium). Your diet should contain less than 1500 mg of sodium daily.  Limit refined or processed carbohydrates. Your diet should include mostly whole grains. Desserts and added sugars should be used sparingly.  Include small amounts of heart-healthy fats. These types of fats include nuts, oils, and tub margarine. Limit saturated and trans fats. These fats have been shown to be harmful in the body. CHOOSING FOODS  The following food groups are based on a 2000 calorie diet. See your Registered Dietitian for individual calorie needs. Grains and Grain Products (6 to 8 servings daily)  Eat More Often: Whole-wheat bread, brown rice, whole-grain or wheat pasta, quinoa, popcorn without added fat or salt (air popped).  Eat Less Often: White bread, white pasta, white rice, cornbread. Vegetables (4 to 5 servings daily)  Eat More Often: Fresh, frozen, and canned vegetables. Vegetables may be raw, steamed, roasted, or grilled with a minimal amount of fat.  Eat Less Often/Avoid: Creamed or fried vegetables. Vegetables in a cheese sauce. Fruit (4 to 5 servings daily)  Eat More Often: All fresh, canned (in natural juice), or frozen fruits. Dried fruits without added sugar. One hundred percent fruit juice ( cup [237 mL] daily).  Eat Less Often: Dried fruits with added sugar. Canned fruit  in light or heavy syrup. [employer], Fish, and Poultry (2 servings or less daily. One serving is 3 to 4 oz [85-114 g]).  Eat More Often: Ninety percent or leaner ground beef, tenderloin, sirloin. Round cuts of beef, chicken breast, [place] breast. All fish. Grill, bake, or broil your meat. Nothing should be fried.  Eat Less Often/Avoid: Fatty cuts of meat, [place], or chicken leg, thigh, or wing. Fried cuts of meat or fish. Dairy (2 to 3 servings)  Eat More Often: Low-fat or fat-free milk, low-fat plain or light yogurt, reduced-fat or part-skim cheese.  Eat Less Often/Avoid: Milk (whole, 2%).Whole milk yogurt. Full-fat cheeses. Nuts, Seeds, and Legumes (4 to 5 servings per week)  Eat More Often: All without added salt.  Eat Less Often/Avoid: Salted nuts and seeds, canned beans with added salt. Fats and Sweets (limited)  Eat More Often: Vegetable oils, tub margarines without trans fats, sugar-free gelatin. Mayonnaise and salad dressings.  Eat Less Often/Avoid: Coconut oils, palm oils, butter, stick margarine, cream, half and half, cookies, candy, pie. FOR MORE INFORMATION The Dash Diet Eating Plan: www.dashdiet.org Document Released: 08/09/2011 Document Revised: 11/12/2011 Document Reviewed: 08/09/2011 [facility] Patient Information 2013 [place], [place].  Ice and Aspercreme bo arm twice a day and as needed No heavy lifting for 2 weeks, report if no improvement for further referral     Bicipital Tendonitis Bicipital tendonitis refers to redness, soreness, and swelling (inflammation) or irritation of the bicep tendon. The biceps muscle is located between the elbow and shoulder of the inner arm. The tendon heads, similar to pieces of rope, connect the bicep muscle to the shoulder socket. They are called  short head and long head tendons. When tendonitis occurs, the long head tendon is inflamed and swollen, and may be thickened or partially torn.  Bicipital tendonitis can occur with other  problems as well, such as arthritis in the shoulder or acromioclavicular joints, tears in the tendons, or other rotator cuff problems.  CAUSES  Overuse of of the arms for overhead activities is the major cause of tendonitis. Many athletes, such as swimmers, baseball players, and tennis players are prone to bicipital tendonitis. Jobs that require manual labor or routine chores, especially chores involving overhead activities can result in overuse and tendonitis. SYMPTOMS Symptoms may include:  Pain in and around the front of the shoulder. Pain may be worse with overhead motion.  Pain or aching that radiates down the arm.  Clicking or shifting sensations in the shoulder. DIAGNOSIS Your caregiver may perform the following:  Physical exam and tests of the biceps and shoulder to observe range of motion, strength, and stability.  X-rays or magnetic resonance imaging (MRI) to confirm the diagnosis. In most common cases, these tests are not necessary. Since other problems may exist in the shoulder or rotator cuff, additional tests may be recommended. TREATMENT Treatment may include the following:  Medications  Your caregiver may prescribe over-the-counter pain relievers.  Steroid injections, such as cortisone, may be recommended. These may help to reduce inflammation and pain.  Physical Therapy - Your caregiver may recommend gentle exercises with the arm. These can help restore strength and range of motion. They may be done at home or with a physical therapist's supervision and input.  Surgery - Arthroscopic or open surgery sometimes is necessary. Surgery may include:  Reattachment or repair of the tendon at the shoulder socket.  Removal of the damaged section of the tendon.  Anchoring the tendon to a different area of the shoulder (tenodesis). HOME CARE INSTRUCTIONS   Avoid overhead motion of the affected arm or any other motion that causes pain.  Take medication for pain as  directed. Do not take these for more than 3 weeks, unless directed to do so by your caregiver.  Ice the affected area for 20 minutes at a time, 3-4 times per day. Place a towel on the skin over the painful area and the ice or cold pack over the towel. Do not place ice directly on the skin.  Perform gentle exercises at home as directed. These will increase strength and flexibility. PREVENTION  Modify your activities as much as possible to protect your arm. A physical therapist or sports medicine physician can help you understand options for safe motion.  Avoid repetitive overhead pulling, lifting, reaching, and throwing until your caregiver tells you it is ok to resume these activities. SEEK MEDICAL CARE IF:  Your pain worsens.  You have difficulty moving the affected arm.  You have trouble performing any of the self-care instructions. MAKE SURE YOU:   Understand these instructions.  Will watch your condition.  Will get help right away if you are not doing well or get worse. Document Released: 09/22/2010 Document Revised: 11/12/2011 Document Reviewed: 09/22/2010 [facility] Patient Information 2013 [place], [place].

## 2013-04-14 ENCOUNTER — Telehealth: Payer: Self-pay | Admitting: *Deleted

## 2013-04-14 DIAGNOSIS — Z0000 Encounter for general adult medical examination without abnormal findings: Secondary | ICD-10-CM

## 2013-04-14 LAB — HEPATIC FUNCTION PANEL
ALT: 11 U/L (ref 0–35)
AST: 11 U/L (ref 0–37)
Bilirubin, Direct: 0.1 mg/dL (ref 0.0–0.3)
Indirect Bilirubin: 0.3 mg/dL (ref 0.0–0.9)

## 2013-04-14 LAB — CBC
HCT: 36 % (ref 36.0–46.0)
MCH: 27.8 pg (ref 26.0–34.0)
MCV: 82 fL (ref 78.0–100.0)
Platelets: 296 10*3/uL (ref 150–400)
RBC: 4.39 MIL/uL (ref 3.87–5.11)

## 2013-04-14 LAB — LIPID PANEL: Cholesterol: 149 mg/dL (ref 0–200)

## 2013-04-14 LAB — RENAL FUNCTION PANEL
Albumin: 4.1 g/dL (ref 3.5–5.2)
CO2: 30 mEq/L (ref 19–32)
Creat: 0.73 mg/dL (ref 0.50–1.10)
Glucose, Bld: 94 mg/dL (ref 70–99)
Sodium: 138 mEq/L (ref 135–145)

## 2013-04-14 NOTE — Telephone Encounter (Signed)
Pt presented to the lab. Orders entered per 12/29/12 office visit as below:  Next visit annual, lipid, renal, hepatic, cbc, tsh, hgba1c

## 2013-04-20 ENCOUNTER — Encounter: Payer: Self-pay | Admitting: Family Medicine

## 2013-04-20 ENCOUNTER — Ambulatory Visit (INDEPENDENT_AMBULATORY_CARE_PROVIDER_SITE_OTHER): Payer: BC Managed Care – PPO | Admitting: Family Medicine

## 2013-04-20 VITALS — BP 130/90 | HR 83 | Temp 98.5°F | Ht 70.0 in | Wt 296.1 lb

## 2013-04-20 DIAGNOSIS — I10 Essential (primary) hypertension: Secondary | ICD-10-CM

## 2013-04-20 DIAGNOSIS — G4700 Insomnia, unspecified: Secondary | ICD-10-CM

## 2013-04-20 DIAGNOSIS — E119 Type 2 diabetes mellitus without complications: Secondary | ICD-10-CM

## 2013-04-20 HISTORY — DX: Insomnia, unspecified: G47.00

## 2013-04-20 MED ORDER — VALSARTAN-HYDROCHLOROTHIAZIDE 160-25 MG PO TABS: 1.0000 | ORAL_TABLET | Freq: Every day | ORAL | Status: DC

## 2013-04-20 NOTE — Assessment & Plan Note (Addendum)
Discussed sleep hygiene and add Melatonin 10 mg at bedtime, benadryl can be used

## 2013-04-20 NOTE — Patient Instructions (Addendum)
Try krill oil, MegaRed caps daily Melatonin 5 or 210 mg at bed, can still use Benadryl  Hypertension As your heart beats, it forces blood through your arteries. This force is your blood pressure. If the pressure is too high, it is called hypertension (HTN) or high blood pressure. HTN is dangerous because you may have it and not know it. High blood pressure may mean that your heart has to work harder to pump blood. Your arteries may be narrow or stiff. The extra work puts you at risk for heart disease, stroke, and other problems.  Blood pressure consists of two numbers, a higher number over a lower, 110/72, for example. It is stated as "110 over 72." The ideal is below 120 for the top number (systolic) and under 80 for the bottom (diastolic). Write down your blood pressure today. You should pay close attention to your blood pressure if you have certain conditions such as:  Heart failure.  Prior heart attack.  Diabetes  Chronic kidney disease.  Prior stroke.  Multiple risk factors for heart disease. To see if you have HTN, your blood pressure should be measured while you are seated with your arm held at the level of the heart. It should be measured at least twice. A one-time elevated blood pressure reading (especially in the Emergency Department) does not mean that you need treatment. There may be conditions in which the blood pressure is different between your right and left arms. It is important to see your caregiver soon for a recheck. Most people have essential hypertension which means that there is not a specific cause. This type of high blood pressure may be lowered by changing lifestyle factors such as:  Stress.  Smoking.  Lack of exercise.  Excessive weight.  Drug/tobacco/alcohol use.  Eating less salt. Most people do not have symptoms from high blood pressure until it has caused damage to the body. Effective treatment can often prevent, delay or reduce that  damage. TREATMENT  When a cause has been identified, treatment for high blood pressure is directed at the cause. There are a large number of medications to treat HTN. These fall into several categories, and your caregiver will help you select the medicines that are best for you. Medications may have side effects. You should review side effects with your caregiver. If your blood pressure stays high after you have made lifestyle changes or started on medicines,   Your medication(s) may need to be changed.  Other problems may need to be addressed.  Be certain you understand your prescriptions, and know how and when to take your medicine.  Be sure to follow up with your caregiver within the time frame advised (usually within two weeks) to have your blood pressure rechecked and to review your medications.  If you are taking more than one medicine to lower your blood pressure, make sure you know how and at what times they should be taken. Taking two medicines at the same time can result in blood pressure that is too low. SEEK IMMEDIATE MEDICAL CARE IF:  You develop a severe headache, blurred or changing vision, or confusion.  You have unusual weakness or numbness, or a faint feeling.  You have severe chest or abdominal pain, vomiting, or breathing problems. MAKE SURE YOU:   Understand these instructions.  Will watch your condition.  Will get help right away if you are not doing well or get worse. Document Released: 08/20/2005 Document Revised: 11/12/2011 Document Reviewed: 04/09/2008 [employer] Patient Information 2014  [employer]

## 2013-04-26 ENCOUNTER — Encounter: Payer: Self-pay | Admitting: Family Medicine

## 2013-04-26 NOTE — Assessment & Plan Note (Signed)
Sugar well controlled, avoid simpel carbs. Increase exercise.

## 2013-04-26 NOTE — Assessment & Plan Note (Signed)
Patient reporting numbers around 150/90 at home encouraged dash diet and will increase diovan hct to 160/25 mg daily

## 2013-04-26 NOTE — Progress Notes (Signed)
Patient ID: Brandi Lester, female   DOB: 12-03-1965, 47 y.o.   MRN: [id number] Brandi Lester [id number] 1966/05/20 04/26/2013      Progress Note-Follow Up  Subjective  Chief Complaint  Chief Complaint  Patient presents with  . Follow-up    on labs    HPI  Patient is a 47 year old female who is in today for followup and discussion of blood pressure mass. Generally feels well but has been struggling with some intermittent headaches. She describes a pressure across her face. Him and he exhibits no contributes it frequently to allergies but does not have a lot of rhinorrhea or pruritus associated. Denies any significant snoring or gasping. No chest pain or palpitations. No shortness of breath GI or GU complaints. Does complain of some intermittent edema which is minimal there is also complains of some numbness the case and the heel of her left foot but denies any trauma or injury.  Past Medical History  Diagnosis Date  . Allergy     allergic rhinitis  . Hypertension   . Diabetes mellitus     Type II  . Obesity   . Fungus infection 10/13    Left great toe  . Ingrown nail 10/13    right foot next to the last toe  . Insomnia 04/20/2013    Past Surgical History  Procedure Laterality Date  . Lasik      eye surgery    Family History  Problem Relation Age of Onset  . Heart attack Mother 32  . Cancer Father     prostate  . Heart attack Sister 59  . Hypertension Other     History   Social History  . Marital Status: Single    Spouse Name: N/A    Number of Children: N/A  . Years of Education: N/A   Occupational History  . Not on file.   Social History Main Topics  . Smoking status: Former Games developer  . Smokeless tobacco: Never Used  . Alcohol Use: Yes     Comment: rare  . Drug Use: Not on file  . Sexual Activity: Not on file   Other Topics Concern  . Not on file   Social History Narrative  . No narrative on file    Current Outpatient Prescriptions on File Prior  to Visit  Medication Sig Dispense Refill  . amLODipine (NORVASC) 5 MG tablet Take 1 tablet (5 mg total) by mouth daily.  90 tablet  1  . azelastine (ASTELIN) 137 MCG/SPRAY nasal spray Place 2 sprays into both nostrils 2 (two) times daily. Use in each nostril as directed       . Epinastine HCl 0.05 % ophthalmic solution Place 1 drop into both eyes as needed.      . fexofenadine (ALLEGRA) 180 MG tablet Take 1 tablet (180 mg total) by mouth daily as needed.  30 tablet  11  . fluticasone (FLONASE) 50 MCG/ACT nasal spray Place 2 sprays into the nose daily as needed for rhinitis.  16 g  6  . naproxen (NAPROSYN) 375 MG tablet 1 tab po bid x 7 days then 1 tab po daily with food x 7 days then prn  60 tablet  2  . omeprazole (PRILOSEC) 20 MG capsule TAKE 1 CAPSULE DAILY  90 capsule  1   No current facility-administered medications on file prior to visit.    Allergies  Allergen Reactions  . Penicillins     REACTION: lip tingling    Review  of Systems  Review of Systems  Constitutional: Negative for fever and malaise/fatigue.  HENT: Negative for congestion.   Eyes: Negative for discharge.  Respiratory: Negative for shortness of breath.   Cardiovascular: Negative for chest pain, palpitations and leg swelling.  Gastrointestinal: Negative for nausea, abdominal pain and diarrhea.  Genitourinary: Negative for dysuria.  Musculoskeletal: Negative for falls.  Skin: Negative for rash.  Neurological: Negative for loss of consciousness and headaches.  Endo/Heme/Allergies: Negative for polydipsia.  Psychiatric/Behavioral: Negative for depression and suicidal ideas. The patient has insomnia. The patient is not nervous/anxious.     Objective  BP 130/90  Pulse 83  Temp(Src) 98.5 F (36.9 C) (Oral)  Ht 5\' 10"  (1.778 m)  Wt 296 lb 1.3 oz (134.301 kg)  BMI 42.48 kg/m2  SpO2 98%  Physical Exam  Physical Exam  Constitutional: She is oriented to person, place, and time and well-developed,  well-nourished, and in no distress. No distress.  HENT:  Head: Normocephalic and atraumatic.  Eyes: Conjunctivae are normal.  Neck: Neck supple. No thyromegaly present.  Cardiovascular: Normal rate, regular rhythm and normal heart sounds.   No murmur heard. Pulmonary/Chest: Effort normal and breath sounds normal. She has no wheezes.  Abdominal: She exhibits no distension and no mass.  Musculoskeletal: She exhibits no edema.  Lymphadenopathy:    She has no cervical adenopathy.  Neurological: She is alert and oriented to person, place, and time.  Skin: Skin is warm and dry. No rash noted. She is not diaphoretic.  Psychiatric: Memory, affect and judgment normal.    Lab Results  Component Value Date   TSH 1.979 04/14/2013   Lab Results  Component Value Date   WBC 7.4 04/14/2013   HGB 12.2 04/14/2013   HCT 36.0 04/14/2013   MCV 82.0 04/14/2013   PLT 296 04/14/2013   Lab Results  Component Value Date   CREATININE 0.73 04/14/2013   BUN 8 04/14/2013   NA 138 04/14/2013   K 3.5 04/14/2013   CL 101 04/14/2013   CO2 30 04/14/2013   Lab Results  Component Value Date   ALT 11 04/14/2013   AST 11 04/14/2013   ALKPHOS 71 04/14/2013   BILITOT 0.4 04/14/2013   Lab Results  Component Value Date   CHOL 149 04/14/2013   Lab Results  Component Value Date   HDL 32* 04/14/2013   Lab Results  Component Value Date   LDLCALC 93 04/14/2013   Lab Results  Component Value Date   TRIG 120 04/14/2013   Lab Results  Component Value Date   CHOLHDL 4.7 04/14/2013     Assessment & Plan  Insomnia Discussed sleep hygiene and add Melatonin 10 mg at bedtime, benadryl can be used  HYPERTENSION Patient reporting numbers around 150/90 at home encouraged dash diet and will increase diovan hct to 160/25 mg daily  DIABETES MELLITUS, TYPE II Sugar well controlled, avoid simpel carbs. Increase exercise.

## 2013-07-03 ENCOUNTER — Encounter: Payer: Self-pay | Admitting: Physician Assistant

## 2013-07-03 ENCOUNTER — Ambulatory Visit (INDEPENDENT_AMBULATORY_CARE_PROVIDER_SITE_OTHER): Payer: BC Managed Care – PPO | Admitting: Physician Assistant

## 2013-07-03 VITALS — BP 128/84 | HR 90 | Temp 98.0°F | Resp 16 | Ht 70.0 in | Wt 304.2 lb

## 2013-07-03 DIAGNOSIS — J019 Acute sinusitis, unspecified: Secondary | ICD-10-CM

## 2013-07-03 DIAGNOSIS — J0140 Acute pansinusitis, unspecified: Secondary | ICD-10-CM | POA: Insufficient documentation

## 2013-07-03 MED ORDER — AZITHROMYCIN 250 MG PO TABS: ORAL_TABLET | ORAL | Status: DC

## 2013-07-03 MED ORDER — HYDROCOD POLST-CHLORPHEN POLST 10-8 MG/5ML PO LQCR: 5.0000 mL | Freq: Two times a day (BID) | ORAL | Status: DC | PRN

## 2013-07-03 NOTE — Progress Notes (Signed)
Patient ID: Brandi Lester, female   DOB: 1966-03-22, 47 y.o.   MRN: [id number]  Patient is a 47 year old African American female who presents to clinic today complaining of nasal congestion, sinus pain, sinus pressure, headache, ear congestion, cough x2 weeks. Patient denies fever, chills, sweats, malaise and fatigue. Denies recent travel. Patient denies history of asthma but has history of allergies requiring injections.  Patient has also noted some tooth pain over the past few days.  Has taken her allergy medicines as prescribed as well as ibuprofen for her symptoms.  Patient also endorses hoarseness of her voice for the past few days.   Past Medical History  Diagnosis Date  . Allergy     allergic rhinitis  . Hypertension   . Diabetes mellitus     Type II  . Obesity   . Fungus infection 10/13    Left great toe  . Ingrown nail 10/13    right foot next to the last toe  . Insomnia 04/20/2013    Current Outpatient Prescriptions on File Prior to Visit  Medication Sig Dispense Refill  . amLODipine (NORVASC) 5 MG tablet Take 1 tablet (5 mg total) by mouth daily.  90 tablet  1  . azelastine (ASTELIN) 137 MCG/SPRAY nasal spray Place 2 sprays into both nostrils 2 (two) times daily. Use in each nostril as directed       . Epinastine HCl 0.05 % ophthalmic solution Place 1 drop into both eyes as needed.      . fexofenadine (ALLEGRA) 180 MG tablet Take 1 tablet (180 mg total) by mouth daily as needed.  30 tablet  11  . fluticasone (FLONASE) 50 MCG/ACT nasal spray Place 2 sprays into the nose daily as needed for rhinitis.  16 g  6  . naproxen (NAPROSYN) 375 MG tablet 1 tab po bid x 7 days then 1 tab po daily with food x 7 days then prn  60 tablet  2  . omeprazole (PRILOSEC) 20 MG capsule TAKE 1 CAPSULE DAILY  90 capsule  1  . valsartan-hydrochlorothiazide (DIOVAN-HCT) 160-25 MG per tablet Take 1 tablet by mouth daily.  90 tablet  1   No current facility-administered medications on file prior to  visit.    Allergies  Allergen Reactions  . Penicillins     REACTION: lip tingling    Family History  Problem Relation Age of Onset  . Heart attack Mother 91  . Cancer Father     prostate  . Heart attack Sister 69  . Hypertension Other     History   Social History  . Marital Status: Single    Spouse Name: N/A    Number of Children: N/A  . Years of Education: N/A   Social History Main Topics  . Smoking status: Former Games developer  . Smokeless tobacco: Never Used  . Alcohol Use: Yes     Comment: rare  . Drug Use: None  . Sexual Activity: None   Other Topics Concern  . None   Social History Narrative  . None   ROS See history of present illness. All other review of systems are negative.  Filed Vitals:   07/03/13 1557  BP: 128/84  Pulse: 90  Temp: 98 F (36.7 C)  Resp: 16    Physical Exam  Vitals reviewed. Constitutional: She is oriented to person, place, and time and well-developed, well-nourished, and in no distress.  HENT:  Head: Normocephalic and atraumatic.  Right Ear: External ear normal.  Left  Ear: External ear normal.  Nose: Nose normal.  Mouth/Throat: Oropharynx is clear and moist. No oropharyngeal exudate.  Tympanic membranes within normal limits bilaterally. There is no spinal tenderness to percussion over her right frontal sinus and bilateral maxillary sinuses.  Eyes: Conjunctivae are normal.  Neck: Neck supple.  Cardiovascular: Normal rate, regular rhythm, normal heart sounds and intact distal pulses.   Pulmonary/Chest: Effort normal and breath sounds normal. No respiratory distress. She has no wheezes. She has no rales. She exhibits no tenderness.  Lymphadenopathy:    She has no cervical adenopathy.  Neurological: She is alert and oriented to person, place, and time. No cranial nerve deficit.  Skin: Skin is warm and dry. No rash noted.  Psychiatric: Affect normal.     Recent Results (from the past 2160 hour(s))  LIPID PANEL     Status:  Abnormal   Collection Time    04/14/13  9:43 AM      Result Value Range   Cholesterol 149  0 - 200 mg/dL   Comment: ATP III Classification:           < 200        mg/dL        Desirable          200 - 239     mg/dL        Borderline High          >= 240        mg/dL        High         Triglycerides 120  <150 mg/dL   HDL 32 (*) >29 mg/dL   Total CHOL/HDL Ratio 4.7     VLDL 24  0 - 40 mg/dL   LDL Cholesterol 93  0 - 99 mg/dL   Comment:       Total Cholesterol/HDL Ratio:CHD Risk                            Coronary Heart Disease Risk Table                                            Men       Women              1/2 Average Risk              3.4        3.3                  Average Risk              5.0        4.4               2X Average Risk              9.6        7.1               3X Average Risk             23.4       11.0     Use the calculated Patient Ratio above and the CHD Risk table      to determine the patient's CHD Risk.     ATP III Classification (LDL):           <  100        mg/dL         Optimal          100 - 129     mg/dL         Near or Above Optimal          130 - 159     mg/dL         Borderline High          160 - 189     mg/dL         High           > 190        mg/dL         Very High        HEPATIC FUNCTION PANEL     Status: None   Collection Time    04/14/13  9:43 AM      Result Value Range   Total Bilirubin 0.4  0.3 - 1.2 mg/dL   Bilirubin, Direct 0.1  0.0 - 0.3 mg/dL   Indirect Bilirubin 0.3  0.0 - 0.9 mg/dL   Alkaline Phosphatase 71  39 - 117 U/L   AST 11  0 - 37 U/L   ALT 11  0 - 35 U/L   Total Protein 6.8  6.0 - 8.3 g/dL   Albumin 4.1  3.5 - 5.2 g/dL  TSH     Status: None   Collection Time    04/14/13  9:43 AM      Result Value Range   TSH 1.979  0.350 - 4.500 uIU/mL  RENAL FUNCTION PANEL     Status: None   Collection Time    04/14/13  9:43 AM      Result Value Range   Sodium 138  135 - 145 mEq/L   Potassium 3.5  3.5 - 5.3 mEq/L    Chloride 101  96 - 112 mEq/L   CO2 30  19 - 32 mEq/L   Glucose, Bld 94  70 - 99 mg/dL   BUN 8  6 - 23 mg/dL   Creat 1.61  0.96 - 0.45 mg/dL   Albumin 4.1  3.5 - 5.2 g/dL   Calcium 8.9  8.4 - 40.9 mg/dL   Phosphorus 3.4  2.3 - 4.6 mg/dL  CBC     Status: None   Collection Time    04/14/13  9:43 AM      Result Value Range   WBC 7.4  4.0 - 10.5 K/uL   RBC 4.39  3.87 - 5.11 MIL/uL   Hemoglobin 12.2  12.0 - 15.0 g/dL   HCT 81.1  91.4 - 78.2 %   MCV 82.0  78.0 - 100.0 fL   MCH 27.8  26.0 - 34.0 pg   MCHC 33.9  30.0 - 36.0 g/dL   RDW 95.6  21.3 - 08.6 %   Platelets 296  150 - 400 K/uL  HEMOGLOBIN A1C     Status: Abnormal   Collection Time    04/14/13  9:43 AM      Result Value Range   Hemoglobin A1C 6.0 (*) <5.7 %   Comment:  According to the ADA Clinical Practice Recommendations for 2011, when     HbA1c is used as a screening test:             >=6.5%   Diagnostic of Diabetes Mellitus                (if abnormal result is confirmed)           5.7-6.4%   Increased risk of developing Diabetes Mellitus           References:Diagnosis and Classification of Diabetes Mellitus,Diabetes     Care,2011,34(Suppl 1):S62-S69 and Standards of Medical Care in             Diabetes - 2011,Diabetes Care,2011,34 (Suppl 1):S11-S61.         Mean Plasma Glucose 126 (*) <117 mg/dL    Assessment/Plan: Acute sinusitis with symptoms > 10 days Rx azithromycin. Rx Tussionex Pennkinetic. Increase fluid intake. Neti Pot with distilled water, or saline nasal spray. Continue current allergy medications. Rest voice. Daily probiotic. Humidifier in bedroom. Call or return to clinic if symptoms persist or acutely worsen.

## 2013-07-03 NOTE — Patient Instructions (Signed)
Please take antibiotic as prescribed.  Increase fluid intake.  Rest your voice.  Continue allergy medications. Daily probiotic. Use neti pot (with distilled water only).  Humidifier in bedroom.  Tussionex cough syrup every 12 hours as needed for cough.  Please call or return to clinic if symptoms are not improving.

## 2013-07-03 NOTE — Assessment & Plan Note (Signed)
Rx azithromycin. Rx Tussionex Pennkinetic. Increase fluid intake. Neti Pot with distilled water, or saline nasal spray. Continue current allergy medications. Rest voice. Daily probiotic. Humidifier in bedroom. Call or return to clinic if symptoms persist or acutely worsen.

## 2013-07-16 ENCOUNTER — Telehealth: Payer: Self-pay | Admitting: *Deleted

## 2013-07-16 DIAGNOSIS — E119 Type 2 diabetes mellitus without complications: Secondary | ICD-10-CM

## 2013-07-16 DIAGNOSIS — Z0000 Encounter for general adult medical examination without abnormal findings: Secondary | ICD-10-CM

## 2013-07-16 DIAGNOSIS — Z79899 Other long term (current) drug therapy: Secondary | ICD-10-CM

## 2013-07-16 DIAGNOSIS — I10 Essential (primary) hypertension: Secondary | ICD-10-CM

## 2013-07-16 LAB — HEPATIC FUNCTION PANEL
AST: 11 U/L (ref 0–37)
Albumin: 4.1 g/dL (ref 3.5–5.2)
Alkaline Phosphatase: 67 U/L (ref 39–117)
Bilirubin, Direct: 0.1 mg/dL (ref 0.0–0.3)
Total Bilirubin: 0.3 mg/dL (ref 0.3–1.2)

## 2013-07-16 LAB — BASIC METABOLIC PANEL
CO2: 26 mEq/L (ref 19–32)
Calcium: 9.3 mg/dL (ref 8.4–10.5)
Creat: 0.69 mg/dL (ref 0.50–1.10)
Potassium: 3.4 mEq/L — ABNORMAL LOW (ref 3.5–5.3)
Sodium: 139 mEq/L (ref 135–145)

## 2013-07-16 LAB — CBC
HCT: 34.2 % — ABNORMAL LOW (ref 36.0–46.0)
MCH: 28.9 pg (ref 26.0–34.0)
MCV: 83 fL (ref 78.0–100.0)
Platelets: 266 10*3/uL (ref 150–400)
RBC: 4.12 MIL/uL (ref 3.87–5.11)

## 2013-07-16 LAB — LIPID PANEL
HDL: 34 mg/dL — ABNORMAL LOW (ref >39)
LDL Cholesterol: 95 mg/dL (ref 0–99)
Triglycerides: 99 mg/dL (ref <150)
VLDL: 20 mg/dL (ref 0–40)

## 2013-07-16 LAB — HEMOGLOBIN A1C
Hgb A1c MFr Bld: 6.1 % — ABNORMAL HIGH (ref <5.7)
Mean Plasma Glucose: 128 mg/dL — ABNORMAL HIGH (ref <117)

## 2013-07-16 NOTE — Telephone Encounter (Signed)
Pt presented to lab, orders placed/SLS

## 2013-07-20 ENCOUNTER — Encounter: Payer: Self-pay | Admitting: Family Medicine

## 2013-07-20 ENCOUNTER — Ambulatory Visit (INDEPENDENT_AMBULATORY_CARE_PROVIDER_SITE_OTHER): Payer: BC Managed Care – PPO | Admitting: Family Medicine

## 2013-07-20 VITALS — BP 138/94 | HR 91 | Temp 98.7°F | Ht 70.0 in | Wt 302.1 lb

## 2013-07-20 DIAGNOSIS — E119 Type 2 diabetes mellitus without complications: Secondary | ICD-10-CM

## 2013-07-20 DIAGNOSIS — D649 Anemia, unspecified: Secondary | ICD-10-CM

## 2013-07-20 DIAGNOSIS — E785 Hyperlipidemia, unspecified: Secondary | ICD-10-CM

## 2013-07-20 DIAGNOSIS — K219 Gastro-esophageal reflux disease without esophagitis: Secondary | ICD-10-CM

## 2013-07-20 DIAGNOSIS — I10 Essential (primary) hypertension: Secondary | ICD-10-CM

## 2013-07-20 DIAGNOSIS — Z23 Encounter for immunization: Secondary | ICD-10-CM

## 2013-07-20 MED ORDER — AMLODIPINE BESYLATE 5 MG PO TABS: 5.0000 mg | ORAL_TABLET | Freq: Every day | ORAL | Status: DC

## 2013-07-20 MED ORDER — OMEPRAZOLE 20 MG PO CPDR: 20.0000 mg | DELAYED_RELEASE_CAPSULE | Freq: Every day | ORAL | Status: DC

## 2013-07-20 NOTE — Progress Notes (Signed)
Pre visit review using our clinic review tool, if applicable. No additional management support is needed unless otherwise documented below in the visit note. 

## 2013-07-20 NOTE — Patient Instructions (Signed)

## 2013-07-25 ENCOUNTER — Encounter: Payer: Self-pay | Admitting: Family Medicine

## 2013-07-25 DIAGNOSIS — D649 Anemia, unspecified: Secondary | ICD-10-CM | POA: Insufficient documentation

## 2013-07-25 DIAGNOSIS — E1169 Type 2 diabetes mellitus with other specified complication: Secondary | ICD-10-CM | POA: Insufficient documentation

## 2013-07-25 DIAGNOSIS — E785 Hyperlipidemia, unspecified: Secondary | ICD-10-CM

## 2013-07-25 HISTORY — DX: Anemia, unspecified: D64.9

## 2013-07-25 HISTORY — DX: Hyperlipidemia, unspecified: E78.5

## 2013-07-25 NOTE — Assessment & Plan Note (Signed)
Mild elevation, encouraged dash diet, did not take all meds today

## 2013-07-25 NOTE — Assessment & Plan Note (Signed)
Mild, increase leafy greens and lean red meat.

## 2013-07-25 NOTE — Assessment & Plan Note (Signed)
hgba1c 6.1 avoid simple carbs

## 2013-07-25 NOTE — Progress Notes (Signed)
Patient ID: Brandi Lester, female   DOB: 1966/01/24, 47 y.o.   MRN: [id number] Brandi Lester [id number] May 20, 1966 07/25/2013      Progress Note-Follow Up  Subjective  Chief Complaint  Chief Complaint  Patient presents with  . Follow-up    3 month  . Injections    flu    HPI  This is a 47 year old female who is in today for followup early feeling well. Does acknowledge a recent sinus infection. Responded to a Z-Pak and Mucinex. No fevers or chills. Now just minimal congestion. No chest pain, palpitations, shortness of breath, GI or GU complaints. He is taking medications as prescribed.  Past Medical History  Diagnosis Date  . Allergy     allergic rhinitis  . Hypertension   . Diabetes mellitus     Type II  . Obesity   . Fungus infection 10/13    Left great toe  . Ingrown nail 10/13    right foot next to the last toe  . Insomnia 04/20/2013    Past Surgical History  Procedure Laterality Date  . Lasik      eye surgery    Family History  Problem Relation Age of Onset  . Heart attack Mother 84  . Cancer Father     prostate  . Heart attack Sister 67  . Hypertension Other     History   Social History  . Marital Status: Single    Spouse Name: N/A    Number of Children: N/A  . Years of Education: N/A   Occupational History  . Not on file.   Social History Main Topics  . Smoking status: Former Games developer  . Smokeless tobacco: Never Used  . Alcohol Use: Yes     Comment: rare  . Drug Use: Not on file  . Sexual Activity: Not on file   Other Topics Concern  . Not on file   Social History Narrative  . No narrative on file    Current Outpatient Prescriptions on File Prior to Visit  Medication Sig Dispense Refill  . azelastine (ASTELIN) 137 MCG/SPRAY nasal spray Place 2 sprays into both nostrils 2 (two) times daily. Use in each nostril as directed       . chlorpheniramine-HYDROcodone (TUSSIONEX PENNKINETIC ER) 10-8 MG/5ML LQCR Take 5 mLs by mouth every 12  (twelve) hours as needed.  115 mL  0  . Epinastine HCl 0.05 % ophthalmic solution Place 1 drop into both eyes as needed.      . fexofenadine (ALLEGRA) 180 MG tablet Take 1 tablet (180 mg total) by mouth daily as needed.  30 tablet  11  . fluticasone (FLONASE) 50 MCG/ACT nasal spray Place 2 sprays into the nose daily as needed for rhinitis.  16 g  6  . naproxen (NAPROSYN) 375 MG tablet 1 tab po bid x 7 days then 1 tab po daily with food x 7 days then prn  60 tablet  2  . valsartan-hydrochlorothiazide (DIOVAN-HCT) 160-25 MG per tablet Take 1 tablet by mouth daily.  90 tablet  1   No current facility-administered medications on file prior to visit.    Allergies  Allergen Reactions  . Penicillins     REACTION: lip tingling    Review of Systems  Review of Systems  Constitutional: Negative for fever and malaise/fatigue.  HENT: Positive for congestion.   Eyes: Negative for discharge.  Respiratory: Positive for cough. Negative for shortness of breath.   Cardiovascular: Negative for chest pain,  palpitations and leg swelling.  Gastrointestinal: Negative for nausea, abdominal pain and diarrhea.  Genitourinary: Negative for dysuria.  Musculoskeletal: Negative for falls.  Skin: Negative for rash.  Neurological: Positive for headaches. Negative for loss of consciousness.  Endo/Heme/Allergies: Negative for polydipsia.  Psychiatric/Behavioral: Negative for depression and suicidal ideas. The patient is not nervous/anxious and does not have insomnia.     Objective  BP 138/94  Pulse 91  Temp(Src) 98.7 F (37.1 C) (Oral)  Ht 5\' 10"  (1.778 m)  Wt 302 lb 1.3 oz (137.023 kg)  BMI 43.34 kg/m2  SpO2 99%  Physical Exam  Physical Exam  Constitutional: She is oriented to person, place, and time and well-developed, well-nourished, and in no distress. No distress.  HENT:  Head: Normocephalic and atraumatic.  Eyes: Conjunctivae are normal.  Neck: Neck supple. No thyromegaly present.   Cardiovascular: Normal rate, regular rhythm and normal heart sounds.   No murmur heard. Pulmonary/Chest: Effort normal and breath sounds normal. She has no wheezes.  Abdominal: She exhibits no distension and no mass.  Musculoskeletal: She exhibits no edema.  Lymphadenopathy:    She has no cervical adenopathy.  Neurological: She is alert and oriented to person, place, and time.  Skin: Skin is warm and dry. No rash noted. She is not diaphoretic.  Psychiatric: Memory, affect and judgment normal.    Lab Results  Component Value Date   TSH 1.979 04/14/2013   Lab Results  Component Value Date   WBC 8.2 07/16/2013   HGB 11.9* 07/16/2013   HCT 34.2* 07/16/2013   MCV 83.0 07/16/2013   PLT 266 07/16/2013   Lab Results  Component Value Date   CREATININE 0.69 07/16/2013   BUN 10 07/16/2013   NA 139 07/16/2013   K 3.4* 07/16/2013   CL 99 07/16/2013   CO2 26 07/16/2013   Lab Results  Component Value Date   ALT 11 07/16/2013   AST 11 07/16/2013   ALKPHOS 67 07/16/2013   BILITOT 0.3 07/16/2013   Lab Results  Component Value Date   CHOL 149 07/16/2013   Lab Results  Component Value Date   HDL 34* 07/16/2013   Lab Results  Component Value Date   LDLCALC 95 07/16/2013   Lab Results  Component Value Date   TRIG 99 07/16/2013   Lab Results  Component Value Date   CHOLHDL 4.4 07/16/2013     Assessment & Plan  HYPERTENSION Mild elevation, encouraged dash diet, did not take all meds today  DIABETES MELLITUS, TYPE II hgba1c 6.1 avoid simple carbs  Dyslipidemia Low hdl, encouraged increased exercise, add krill oil caps,   Anemia Mild, increase leafy greens and lean red meat.

## 2013-07-25 NOTE — Assessment & Plan Note (Signed)
Low hdl, encouraged increased exercise, add krill oil caps,

## 2013-08-17 ENCOUNTER — Encounter: Payer: Self-pay | Admitting: Physician Assistant

## 2013-08-17 ENCOUNTER — Ambulatory Visit (INDEPENDENT_AMBULATORY_CARE_PROVIDER_SITE_OTHER): Payer: BC Managed Care – PPO | Admitting: Physician Assistant

## 2013-08-17 VITALS — BP 142/88 | HR 72 | Temp 98.6°F | Resp 20 | Wt 308.0 lb

## 2013-08-17 DIAGNOSIS — J329 Chronic sinusitis, unspecified: Secondary | ICD-10-CM

## 2013-08-17 DIAGNOSIS — A088 Other specified intestinal infections: Secondary | ICD-10-CM

## 2013-08-17 DIAGNOSIS — A084 Viral intestinal infection, unspecified: Secondary | ICD-10-CM

## 2013-08-17 MED ORDER — AZITHROMYCIN 250 MG PO TABS: ORAL_TABLET | ORAL | Status: DC

## 2013-08-17 NOTE — Patient Instructions (Signed)
Take antibiotic as prescribed with food.  Take a daily probiotic.  Increase fluid intake.  Rest.  Saline nasal spray.  Continue allergy medications.  Read information below on BRAT diet.  Ginger ale for nausea.   Diet for Diarrhea, Adult Frequent, runny stools (diarrhea) may be caused or worsened by food or drink. Diarrhea may be relieved by changing your diet. Since diarrhea can last up to 7 days, it is easy for you to lose too much fluid from the body and become dehydrated. Fluids that are lost need to be replaced. Along with a modified diet, make sure you drink enough fluids to keep your urine clear or pale yellow. DIET INSTRUCTIONS  Ensure adequate fluid intake (hydration): have 1 cup (8 oz) of fluid for each diarrhea episode. Avoid fluids that contain simple sugars or sports drinks, fruit juices, whole milk products, and sodas. Your urine should be clear or pale yellow if you are drinking enough fluids. Hydrate with an oral rehydration solution that you can purchase at pharmacies, retail stores, and online. You can prepare an oral rehydration solution at home by mixing the following ingredients together:    tsp table salt.   tsp baking soda.   tsp salt substitute containing potassium chloride.  1  tablespoons sugar.  1 L (34 oz) of water.  Certain foods and beverages may increase the speed at which food moves through the gastrointestinal (GI) tract. These foods and beverages should be avoided and include:  Caffeinated and alcoholic beverages.  High-fiber foods, such as raw fruits and vegetables, nuts, seeds, and whole grain breads and cereals.  Foods and beverages sweetened with sugar alcohols, such as xylitol, sorbitol, and mannitol.  Some foods may be well tolerated and may help thicken stool including:  Starchy foods, such as rice, toast, pasta, low-sugar cereal, oatmeal, grits, baked potatoes, crackers, and bagels.   Bananas.   Applesauce.  Add probiotic-rich foods to  help increase healthy bacteria in the GI tract, such as yogurt and fermented milk products. RECOMMENDED FOODS AND BEVERAGES Starches Choose foods with less than 2 g of fiber per serving.  Recommended:  White, [place], and pita breads, plain rolls, buns, bagels. Plain muffins, matzo. Soda, saltine, or graham crackers. Pretzels, melba toast, zwieback. Cooked cereals made with water: cornmeal, farina, cream cereals. Dry cereals: refined corn, wheat, rice. Potatoes prepared any way without skins, refined macaroni, spaghetti, noodles, refined rice.  Avoid:  Bread, rolls, or crackers made with whole wheat, multi-grains, rye, bran seeds, nuts, or coconut. Corn tortillas or taco shells. Cereals containing whole grains, multi-grains, bran, coconut, nuts, raisins. Cooked or dry oatmeal. Coarse wheat cereals, granola. Cereals advertised as "high-fiber." Potato skins. Whole grain pasta, wild or brown rice. Popcorn. Sweet potatoes, yams. Sweet rolls, doughnuts, waffles, pancakes, sweet breads. Vegetables  Recommended: Strained tomato and vegetable juices. Most well-cooked and canned vegetables without seeds. Fresh: Tender lettuce, cucumber without the skin, cabbage, spinach, bean sprouts.  Avoid: Fresh, cooked, or canned: Artichokes, baked beans, beet greens, broccoli, Brussels sprouts, corn, kale, legumes, peas, sweet potatoes. Cooked: Green or red cabbage, spinach. Avoid large servings of any vegetables because vegetables shrink when cooked, and they contain more fiber per serving than fresh vegetables. Fruit  Recommended: Cooked or canned: Apricots, applesauce, cantaloupe, cherries, fruit cocktail, grapefruit, grapes, kiwi, mandarin oranges, peaches, pears, plums, watermelon. Fresh: Apples without skin, ripe banana, grapes, cantaloupe, cherries, grapefruit, peaches, oranges, plums. Keep servings limited to  cup or 1 piece.  Avoid: Fresh: Apples with skin, apricots,  mangoes, pears, raspberries, strawberries.  Prune juice, stewed or dried prunes. Dried fruits, raisins, dates. Large servings of all fresh fruits. Protein  Recommended: Ground or well-cooked tender beef, ham, veal, lamb, pork, or poultry. Eggs. Fish, oysters, shrimp, lobster, other seafoods. Liver, organ meats.  Avoid: Tough, fibrous meats with gristle. Peanut butter, smooth or chunky. Cheese, nuts, seeds, legumes, dried peas, beans, lentils. Dairy  Recommended: Yogurt, lactose-free milk, kefir, drinkable yogurt, buttermilk, soy milk, or plain hard cheese.  Avoid: Milk, chocolate milk, beverages made with milk, such as milkshakes. Soups  Recommended: Bouillon, broth, or soups made from allowed foods. Any strained soup.  Avoid: Soups made from vegetables that are not allowed, cream or milk-based soups. Desserts and Sweets  Recommended: Sugar-free gelatin, sugar-free frozen ice pops made without sugar alcohol.  Avoid: Plain cakes and cookies, pie made with fruit, pudding, custard, cream pie. Gelatin, fruit, ice, sherbet, frozen ice pops. Ice cream, ice milk without nuts. Plain hard candy, honey, jelly, molasses, syrup, sugar, chocolate syrup, gumdrops, marshmallows. Fats and Oils  Recommended: Limit fats to less than 8 tsp per day.  Avoid: Seeds, nuts, olives, avocados. Margarine, butter, cream, mayonnaise, salad oils, plain salad dressings. Plain gravy, crisp bacon without rind. Beverages  Recommended: Water, decaffeinated teas, oral rehydration solutions, sugar-free beverages not sweetened with sugar alcohols.  Avoid: Fruit juices, caffeinated beverages (coffee, tea, soda), alcohol, sports drinks, or lemon-lime soda. Condiments  Recommended: Ketchup, mustard, horseradish, vinegar, cocoa powder. Spices in moderation: allspice, basil, bay leaves, celery powder or leaves, cinnamon, cumin powder, curry powder, ginger, mace, marjoram, onion or garlic powder, oregano, paprika, parsley flakes, ground pepper, rosemary, sage, savory,  tarragon, thyme, turmeric.  Avoid: Coconut, honey. Document Released: 11/10/2003 Document Revised: 05/14/2012 Document Reviewed: 01/04/2012 [facility] Patient Information 2014 [place], [place].

## 2013-08-17 NOTE — Progress Notes (Signed)
Patient ID: Brandi Lester, female   DOB: May 09, 1966, 47 y.o.   MRN: [id number]  Patient presents to clinic today c/o 1 week of sinus pressure, sinus pain, nasal congestion and tooth pain.  Denies fever, chills, myalgias.  Endorses some malaise.  Denies ear pain.  Denies recent travel.  Does have hx of allergic rhinitis.  Patient also complains of 2 days of diarrhea and nausea.  Denies travel, abdominal pain, tenesmus, hematochezia or melena.  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   Past Medical History  Diagnosis Date  . Allergy     allergic rhinitis  . Hypertension   . Diabetes mellitus     Type II  . Obesity   . Fungus infection 10/13    Left great toe  . Ingrown nail 10/13    right foot next to the last toe  . Insomnia 04/20/2013  . Anemia 07/25/2013  . Dyslipidemia 07/25/2013    Current Outpatient Prescriptions on File Prior to Visit  Medication Sig Dispense Refill  . amLODipine (NORVASC) 5 MG tablet Take 1 tablet (5 mg total) by mouth daily.  90 tablet  1  . azelastine (ASTELIN) 137 MCG/SPRAY nasal spray Place 2 sprays into both nostrils 2 (two) times daily. Use in each nostril as directed       . chlorpheniramine-HYDROcodone (TUSSIONEX PENNKINETIC ER) 10-8 MG/5ML LQCR Take 5 mLs by mouth every 12 (twelve) hours as needed.  115 mL  0  . Epinastine HCl 0.05 % ophthalmic solution Place 1 drop into both eyes as needed.      [name] EPIPEN 2-PAK 0.3 MG/0.3ML SOAJ injection       . fexofenadine (ALLEGRA) 180 MG tablet Take 1 tablet (180 mg total) by mouth daily as needed.  30 tablet  11  . fluticasone (FLONASE) 50 MCG/ACT nasal spray Place 2 sprays into the nose daily as needed for rhinitis.  16 g  6  . naproxen (NAPROSYN) 375 MG tablet 1 tab po bid x 7 days then 1 tab po daily with food x 7 days then prn  60 tablet  2  . omeprazole (PRILOSEC) 20 MG capsule Take 1 capsule (20 mg total) by mouth daily.  90 capsule  1  . valsartan-hydrochlorothiazide (DIOVAN-HCT) 160-25 MG  per tablet Take 1 tablet by mouth daily.  90 tablet  1   No current facility-administered medications on file prior to visit.    Allergies  Allergen Reactions  . Penicillins     REACTION: lip tingling    Family History  Problem Relation Age of Onset  . Heart attack Mother 57  . Cancer Father     prostate  . Heart attack Sister 41  . Hypertension Other     History   Social History  . Marital Status: Single    Spouse Name: N/A    Number of Children: N/A  . Years of Education: N/A   Social History Main Topics  . Smoking status: Former Games developer  . Smokeless tobacco: Never Used  . Alcohol Use: Yes     Comment: rare  . Drug Use: None  . Sexual Activity: None   Other Topics Concern  . None   Social History Narrative  . None    Review of Systems - See HPI.  All other ROS are negative.   Filed Vitals:   08/17/13 1535  BP: 142/88  Pulse: 72  Temp: 98.6 F (37 C)  Resp: 20   Physical Exam  Vitals reviewed. Constitutional: She is oriented to person, place, and time.  Obese AA female in no acute distress  HENT:  Head: Normocephalic and atraumatic.  Right Ear: External ear normal.  Left Ear: External ear normal.  Nose: Nose normal.  Mouth/Throat: Oropharynx is clear and moist. No oropharyngeal exudate.  TM within normal limits bilaterally.  Tenderness to percussion over R frontal and maxillary sinuses.  Eyes: Conjunctivae are normal.  Neck: Neck supple.  Cardiovascular: Normal rate, regular rhythm, normal heart sounds and intact distal pulses.   Pulmonary/Chest: Effort normal and breath sounds normal. No respiratory distress. She has no wheezes. She has no rales. She exhibits no tenderness.  Abdominal: Soft. Bowel sounds are normal. She exhibits no distension and no mass. There is no tenderness. There is no rebound and no guarding.  Lymphadenopathy:    She has no cervical adenopathy.  Neurological: She is alert and oriented to person, place, and time.  Skin:  Skin is warm and dry. No rash noted.  Psychiatric: Affect normal.     Recent Results (from the past 2160 hour(s))  CBC     Status: Abnormal   Collection Time    07/16/13  4:48 PM      Result Value Range   WBC 8.2  4.0 - 10.5 K/uL   RBC 4.12  3.87 - 5.11 MIL/uL   Hemoglobin 11.9 (*) 12.0 - 15.0 g/dL   HCT 16.1 (*) 09.6 - 04.5 %   MCV 83.0  78.0 - 100.0 fL   MCH 28.9  26.0 - 34.0 pg   MCHC 34.8  30.0 - 36.0 g/dL   RDW 40.9  81.1 - 91.4 %   Platelets 266  150 - 400 K/uL  LIPID PANEL     Status: Abnormal   Collection Time    07/16/13  4:48 PM      Result Value Range   Cholesterol 149  0 - 200 mg/dL   Comment: ATP III Classification:           < 200        mg/dL        Desirable          200 - 239     mg/dL        Borderline High          >= 240        mg/dL        High         Triglycerides 99  <150 mg/dL   HDL 34 (*) >78 mg/dL   Total CHOL/HDL Ratio 4.4     VLDL 20  0 - 40 mg/dL   LDL Cholesterol 95  0 - 99 mg/dL   Comment:       Total Cholesterol/HDL Ratio:CHD Risk                            Coronary Heart Disease Risk Table                                            Men       Women              1/2 Average Risk              3.4        3.3  Average Risk              5.0        4.4               2X Average Risk              9.6        7.1               3X Average Risk             23.4       11.0     Use the calculated Patient Ratio above and the CHD Risk table      to determine the patient's CHD Risk.     ATP III Classification (LDL):           < 100        mg/dL         Optimal          100 - 129     mg/dL         Near or Above Optimal          130 - 159     mg/dL         Borderline High          160 - 189     mg/dL         High           > 190        mg/dL         Very High        BASIC METABOLIC PANEL     Status: Abnormal   Collection Time    07/16/13  4:48 PM      Result Value Range   Sodium 139  135 - 145 mEq/L   Potassium 3.4 (*) 3.5 - 5.3  mEq/L   Chloride 99  96 - 112 mEq/L   CO2 26  19 - 32 mEq/L   Glucose, Bld 83  70 - 99 mg/dL   BUN 10  6 - 23 mg/dL   Creat 9.60  4.54 - 0.98 mg/dL   Calcium 9.3  8.4 - 11.9 mg/dL  HEMOGLOBIN J4N     Status: Abnormal   Collection Time    07/16/13  4:48 PM      Result Value Range   Hemoglobin A1C 6.1 (*) <5.7 %   Comment:                                                                            According to the ADA Clinical Practice Recommendations for 2011, when     HbA1c is used as a screening test:             >=6.5%   Diagnostic of Diabetes Mellitus                (if abnormal result is confirmed)           5.7-6.4%   Increased risk of developing Diabetes Mellitus           References:Diagnosis and Classification of Diabetes Mellitus,Diabetes     Care,2011,34(Suppl 1):S62-S69 and  Standards of Medical Care in             Diabetes - 2011,Diabetes Care,2011,34 (Suppl 1):S11-S61.         Mean Plasma Glucose 128 (*) <117 mg/dL  HEPATIC FUNCTION PANEL     Status: None   Collection Time    07/16/13  4:48 PM      Result Value Range   Total Bilirubin 0.3  0.3 - 1.2 mg/dL   Bilirubin, Direct 0.1  0.0 - 0.3 mg/dL   Indirect Bilirubin 0.2  0.0 - 0.9 mg/dL   Alkaline Phosphatase 67  39 - 117 U/L   AST 11  0 - 37 U/L   ALT 11  0 - 35 U/L   Total Protein 7.0  6.0 - 8.3 g/dL   Albumin 4.1  3.5 - 5.2 g/dL    Assessment/Plan: Sinusitis Rx Azithromycin.  Rest. Increase fluid intake.  Continue probiotic.  Humidifier in bedroom.  Mucinex.  Continue flonase and saline nasal spray.  Claritin daily.  Viral gastroenteritis History and exam consistent with viral GE.  Nausea likely worsened from sinus drainage.  Gingerale.  BRAT diet.  Increase fluid intake.  Immodium if needed. Return to full diet as tolerated.

## 2013-08-18 DIAGNOSIS — A084 Viral intestinal infection, unspecified: Secondary | ICD-10-CM | POA: Insufficient documentation

## 2013-08-18 NOTE — Assessment & Plan Note (Signed)
History and exam consistent with viral GE.  Nausea likely worsened from sinus drainage.  Gingerale.  BRAT diet.  Increase fluid intake.  Immodium if needed. Return to full diet as tolerated.

## 2013-08-18 NOTE — Assessment & Plan Note (Signed)
Rx Azithromycin.  Rest. Increase fluid intake.  Continue probiotic.  Humidifier in bedroom.  Mucinex.  Continue flonase and saline nasal spray.  Claritin daily.

## 2013-08-29 ENCOUNTER — Other Ambulatory Visit: Payer: Self-pay | Admitting: Internal Medicine

## 2013-09-12 ENCOUNTER — Other Ambulatory Visit: Payer: Self-pay | Admitting: Internal Medicine

## 2013-09-14 NOTE — Telephone Encounter (Signed)
Rx request to pharmacy/[facility]  

## 2013-10-14 ENCOUNTER — Telehealth: Payer: Self-pay

## 2013-10-14 DIAGNOSIS — D649 Anemia, unspecified: Secondary | ICD-10-CM

## 2013-10-14 DIAGNOSIS — E785 Hyperlipidemia, unspecified: Secondary | ICD-10-CM

## 2013-10-14 DIAGNOSIS — I10 Essential (primary) hypertension: Secondary | ICD-10-CM

## 2013-10-14 DIAGNOSIS — E119 Type 2 diabetes mellitus without complications: Secondary | ICD-10-CM

## 2013-10-14 NOTE — Telephone Encounter (Signed)
Patient came in and had labs drawn. Please verify which labs need to be ran

## 2013-10-14 NOTE — Telephone Encounter (Signed)
Hgba1c, lipid, renal, hepatic, cbc for anemia, diabetes and hyperlipid

## 2013-10-15 LAB — HEPATIC FUNCTION PANEL
ALBUMIN: 4 g/dL (ref 3.5–5.2)
ALK PHOS: 68 U/L (ref 39–117)
ALT: 14 U/L (ref 0–35)
AST: 12 U/L (ref 0–37)
BILIRUBIN TOTAL: 0.3 mg/dL (ref 0.2–1.2)
Bilirubin, Direct: <0.1 mg/dL (ref 0.0–0.3)
Total Protein: 7.1 g/dL (ref 6.0–8.3)

## 2013-10-15 LAB — RENAL FUNCTION PANEL
Albumin: 4 g/dL (ref 3.5–5.2)
BUN: 9 mg/dL (ref 6–23)
CHLORIDE: 98 meq/L (ref 96–112)
CO2: 30 meq/L (ref 19–32)
Calcium: 9.7 mg/dL (ref 8.4–10.5)
Creat: 0.65 mg/dL (ref 0.50–1.10)
Glucose, Bld: 86 mg/dL (ref 70–99)
Phosphorus: 4.1 mg/dL (ref 2.3–4.6)
Potassium: 3.7 mEq/L (ref 3.5–5.3)
Sodium: 137 mEq/L (ref 135–145)

## 2013-10-15 LAB — LIPID PANEL
CHOLESTEROL: 143 mg/dL (ref 0–200)
HDL: 37 mg/dL — ABNORMAL LOW (ref >39)
LDL CALC: 76 mg/dL (ref 0–99)
Total CHOL/HDL Ratio: 3.9 Ratio
Triglycerides: 149 mg/dL (ref <150)
VLDL: 30 mg/dL (ref 0–40)

## 2013-10-15 LAB — CBC
HEMATOCRIT: 37.7 % (ref 36.0–46.0)
Hemoglobin: 12.8 g/dL (ref 12.0–15.0)
MCH: 28.3 pg (ref 26.0–34.0)
MCHC: 34 g/dL (ref 30.0–36.0)
MCV: 83.4 fL (ref 78.0–100.0)
Platelets: 313 10*3/uL (ref 150–400)
RBC: 4.52 MIL/uL (ref 3.87–5.11)
RDW: 14.5 % (ref 11.5–15.5)
WBC: 2.6 10*3/uL — ABNORMAL LOW (ref 4.0–10.5)

## 2013-10-15 LAB — HEMOGLOBIN A1C
HEMOGLOBIN A1C: 6 % — AB (ref <5.7)
Mean Plasma Glucose: 126 mg/dL — ABNORMAL HIGH (ref <117)

## 2013-10-15 NOTE — Telephone Encounter (Signed)
Lab order placed.

## 2013-10-20 ENCOUNTER — Ambulatory Visit: Payer: BC Managed Care – PPO | Admitting: Family Medicine

## 2013-11-12 ENCOUNTER — Encounter: Payer: Self-pay | Admitting: Family Medicine

## 2013-11-12 ENCOUNTER — Telehealth: Payer: Self-pay | Admitting: Family Medicine

## 2013-11-12 ENCOUNTER — Ambulatory Visit (INDEPENDENT_AMBULATORY_CARE_PROVIDER_SITE_OTHER): Payer: BC Managed Care – PPO | Admitting: Family Medicine

## 2013-11-12 VITALS — BP 134/82 | HR 94 | Temp 98.9°F | Ht 70.0 in | Wt 310.0 lb

## 2013-11-12 DIAGNOSIS — I10 Essential (primary) hypertension: Secondary | ICD-10-CM

## 2013-11-12 DIAGNOSIS — IMO0001 Reserved for inherently not codable concepts without codable children: Secondary | ICD-10-CM

## 2013-11-12 DIAGNOSIS — E669 Obesity, unspecified: Secondary | ICD-10-CM

## 2013-11-12 DIAGNOSIS — E785 Hyperlipidemia, unspecified: Secondary | ICD-10-CM

## 2013-11-12 DIAGNOSIS — E119 Type 2 diabetes mellitus without complications: Secondary | ICD-10-CM

## 2013-11-12 DIAGNOSIS — M7918 Myalgia, other site: Secondary | ICD-10-CM

## 2013-11-12 DIAGNOSIS — K219 Gastro-esophageal reflux disease without esophagitis: Secondary | ICD-10-CM

## 2013-11-12 DIAGNOSIS — T7840XA Allergy, unspecified, initial encounter: Secondary | ICD-10-CM

## 2013-11-12 DIAGNOSIS — D649 Anemia, unspecified: Secondary | ICD-10-CM

## 2013-11-12 LAB — CBC
HEMATOCRIT: 34.7 % — AB (ref 36.0–46.0)
Hemoglobin: 11.8 g/dL — ABNORMAL LOW (ref 12.0–15.0)
MCH: 27.9 pg (ref 26.0–34.0)
MCHC: 34 g/dL (ref 30.0–36.0)
MCV: 82 fL (ref 78.0–100.0)
PLATELETS: 281 10*3/uL (ref 150–400)
RBC: 4.23 MIL/uL (ref 3.87–5.11)
RDW: 14.8 % (ref 11.5–15.5)
WBC: 7.7 10*3/uL (ref 4.0–10.5)

## 2013-11-12 MED ORDER — PANTOPRAZOLE SODIUM 40 MG PO TBEC: 40.0000 mg | DELAYED_RELEASE_TABLET | Freq: Every day | ORAL | Status: DC

## 2013-11-12 MED ORDER — FEXOFENADINE HCL 180 MG PO TABS: 180.0000 mg | ORAL_TABLET | Freq: Every day | ORAL | Status: DC | PRN

## 2013-11-12 MED ORDER — FLUTICASONE PROPIONATE 50 MCG/ACT NA SUSP: 2.0000 | Freq: Every day | NASAL | Status: DC

## 2013-11-12 MED ORDER — OMEPRAZOLE 20 MG PO CPDR: 20.0000 mg | DELAYED_RELEASE_CAPSULE | Freq: Every day | ORAL | Status: DC

## 2013-11-12 NOTE — Progress Notes (Signed)
Pre visit review using our clinic review tool, if applicable. No additional management support is needed unless otherwise documented below in the visit note. 

## 2013-11-12 NOTE — Patient Instructions (Signed)
Gastroesophageal Reflux Disease, Adult  Gastroesophageal reflux disease (GERD) happens when acid from your stomach flows up into the esophagus. When acid comes in contact with the esophagus, the acid causes soreness (inflammation) in the esophagus. Over time, GERD may create small holes (ulcers) in the lining of the esophagus.  CAUSES   · Increased body weight. This puts pressure on the stomach, making acid rise from the stomach into the esophagus.  · Smoking. This increases acid production in the stomach.  · Drinking alcohol. This causes decreased pressure in the lower esophageal sphincter (valve or ring of muscle between the esophagus and stomach), allowing acid from the stomach into the esophagus.  · Late evening meals and a full stomach. This increases pressure and acid production in the stomach.  · A malformed lower esophageal sphincter.  Sometimes, no cause is found.  SYMPTOMS   · Burning pain in the lower part of the mid-chest behind the breastbone and in the mid-stomach area. This may occur twice a week or more often.  · Trouble swallowing.  · Sore throat.  · Dry cough.  · Asthma-like symptoms including chest tightness, shortness of breath, or wheezing.  DIAGNOSIS   Your caregiver may be able to diagnose GERD based on your symptoms. In some cases, X-rays and other tests may be done to check for complications or to check the condition of your stomach and esophagus.  TREATMENT   Your caregiver may recommend over-the-counter or prescription medicines to help decrease acid production. Ask your caregiver before starting or adding any new medicines.   HOME CARE INSTRUCTIONS   · Change the factors that you can control. Ask your caregiver for guidance concerning weight loss, quitting smoking, and alcohol consumption.  · Avoid foods and drinks that make your symptoms worse, such as:  · Caffeine or alcoholic drinks.  · Chocolate.  · Peppermint or mint flavorings.  · Garlic and onions.  · Spicy foods.  · Citrus fruits,  such as oranges, lemons, or limes.  · Tomato-based foods such as sauce, chili, salsa, and pizza.  · Fried and fatty foods.  · Avoid lying down for the 3 hours prior to your bedtime or prior to taking a nap.  · Eat small, frequent meals instead of large meals.  · Wear loose-fitting clothing. Do not wear anything tight around your waist that causes pressure on your stomach.  · Raise the head of your bed 6 to 8 inches with wood blocks to help you sleep. Extra pillows will not help.  · Only take over-the-counter or prescription medicines for pain, discomfort, or fever as directed by your caregiver.  · Do not take aspirin, ibuprofen, or other nonsteroidal anti-inflammatory drugs (NSAIDs).  SEEK IMMEDIATE MEDICAL CARE IF:   · You have pain in your arms, neck, jaw, teeth, or back.  · Your pain increases or changes in intensity or duration.  · You develop nausea, vomiting, or sweating (diaphoresis).  · You develop shortness of breath, or you faint.  · Your vomit is green, yellow, black, or looks like coffee grounds or blood.  · Your stool is red, bloody, or black.  These symptoms could be signs of other problems, such as heart disease, gastric bleeding, or esophageal bleeding.  MAKE SURE YOU:   · Understand these instructions.  · Will watch your condition.  · Will get help right away if you are not doing well or get worse.  Document Released: 05/30/2005 Document Revised: 11/12/2011 Document Reviewed: 03/09/2011  [employer]® Patient   Information ©2014 [employer].

## 2013-11-12 NOTE — Telephone Encounter (Signed)
Lab order week of 6 2 2015 Return in about 3 months (around 02/12/2014) for lipid, renal, cbc, tsh, hepatic, hgba1c prior.

## 2013-11-15 ENCOUNTER — Encounter: Payer: Self-pay | Admitting: Family Medicine

## 2013-11-15 DIAGNOSIS — K219 Gastro-esophageal reflux disease without esophagitis: Secondary | ICD-10-CM | POA: Insufficient documentation

## 2013-11-15 NOTE — Assessment & Plan Note (Signed)
Right arm pain, following with [facility] and has undergone some PT with some improvement. Will call if worsens

## 2013-11-15 NOTE — Progress Notes (Signed)
Patient ID: Brandi Lester, female   DOB: 10-06-1965, 48 y.o.   MRN: [id number] LAKRISHA ISEMAN [id number] 11-05-1965 11/15/2013      Progress Note-Follow Up  Subjective  Chief Complaint  Chief Complaint  Patient presents with  . Follow-up    3 month    HPI  Patient is a 48 year old female in today for routine medical care. Is having some trouble getting her PPI so heartburn has worsened recently. No other recent illness. Denies CP/palp/SOB/HA/congestion/fevers/GI or GU c/o. Taking meds as prescribed  Past Medical History  Diagnosis Date  . Allergy     allergic rhinitis  . Hypertension   . Diabetes mellitus     Type II  . Obesity   . Fungus infection 10/13    Left great toe  . Ingrown nail 10/13    right foot next to the last toe  . Insomnia 04/20/2013  . Anemia 07/25/2013  . Dyslipidemia 07/25/2013    Past Surgical History  Procedure Laterality Date  . Lasik      eye surgery    Family History  Problem Relation Age of Onset  . Heart attack Mother 79  . Cancer Father     prostate  . Heart attack Sister 19  . Hypertension Other     History   Social History  . Marital Status: Single    Spouse Name: N/A    Number of Children: N/A  . Years of Education: N/A   Occupational History  . Not on file.   Social History Main Topics  . Smoking status: Former Research scientist (life sciences)  . Smokeless tobacco: Never Used  . Alcohol Use: Yes     Comment: rare  . Drug Use: Not on file  . Sexual Activity: Not on file   Other Topics Concern  . Not on file   Social History Narrative  . No narrative on file    Current Outpatient Prescriptions on File Prior to Visit  Medication Sig Dispense Refill  . amLODipine (NORVASC) 5 MG tablet Take 1 tablet (5 mg total) by mouth daily.  90 tablet  1  . azelastine (ASTELIN) 137 MCG/SPRAY nasal spray Place 2 sprays into both nostrils 2 (two) times daily. Use in each nostril as directed       . Epinastine HCl 0.05 % ophthalmic solution Place 1  drop into both eyes as needed.      [name] EPIPEN 2-PAK 0.3 MG/0.3ML SOAJ injection        No current facility-administered medications on file prior to visit.    Allergies  Allergen Reactions  . Penicillins     REACTION: lip tingling    Review of Systems  Review of Systems  Constitutional: Negative for fever and malaise/fatigue.  HENT: Negative for congestion.   Eyes: Negative for discharge.  Respiratory: Negative for shortness of breath.   Cardiovascular: Negative for chest pain, palpitations and leg swelling.  Gastrointestinal: Positive for heartburn. Negative for nausea, abdominal pain and diarrhea.  Genitourinary: Negative for dysuria.  Musculoskeletal: Negative for falls.  Skin: Negative for rash.  Neurological: Negative for loss of consciousness and headaches.  Endo/Heme/Allergies: Negative for polydipsia.  Psychiatric/Behavioral: Negative for depression and suicidal ideas. The patient is not nervous/anxious and does not have insomnia.     Objective  BP 134/82  Pulse 94  Temp(Src) 98.9 F (37.2 C) (Oral)  Ht 5\' 10"  (1.778 m)  Wt 310 lb (140.615 kg)  BMI 44.48 kg/m2  SpO2 97%  Physical Exam  Physical Exam  Constitutional: She is oriented to person, place, and time and well-developed, well-nourished, and in no distress. No distress.  HENT:  Head: Normocephalic and atraumatic.  Eyes: Conjunctivae are normal.  Neck: Neck supple. No thyromegaly present.  Cardiovascular: Normal rate, regular rhythm and normal heart sounds.   No murmur heard. Pulmonary/Chest: Effort normal and breath sounds normal. She has no wheezes.  Abdominal: She exhibits no distension and no mass.  Musculoskeletal: She exhibits no edema.  Lymphadenopathy:    She has no cervical adenopathy.  Neurological: She is alert and oriented to person, place, and time.  Skin: Skin is warm and dry. No rash noted. She is not diaphoretic.  Psychiatric: Memory, affect and judgment normal.    Lab Results   Component Value Date   TSH 1.979 04/14/2013   Lab Results  Component Value Date   WBC 7.7 11/12/2013   HGB 11.8* 11/12/2013   HCT 34.7* 11/12/2013   MCV 82.0 11/12/2013   PLT 281 11/12/2013   Lab Results  Component Value Date   CREATININE 0.65 10/15/2013   BUN 9 10/15/2013   NA 137 10/15/2013   K 3.7 10/15/2013   CL 98 10/15/2013   CO2 30 10/15/2013   Lab Results  Component Value Date   ALT 14 10/15/2013   AST 12 10/15/2013   ALKPHOS 68 10/15/2013   BILITOT 0.3 10/15/2013   Lab Results  Component Value Date   CHOL 143 10/15/2013   Lab Results  Component Value Date   HDL 37* 10/15/2013   Lab Results  Component Value Date   LDLCALC 76 10/15/2013   Lab Results  Component Value Date   TRIG 149 10/15/2013   Lab Results  Component Value Date   CHOLHDL 3.9 10/15/2013     Assessment & Plan  Dyslipidemia Avoid offending foods, start probiotics. Do not eat large meals in late evening and consider raising head of bed. Given rx for Protonix and is going to check and see if this is cheaper than Omeprazole.   OBESITY, UNSPECIFIED Encouraged DASH diet, decrease po intake and increase exercise as tolerated. Needs 7-8 hours of sleep nightly. Avoid trans fats, eat small, frequent meals every 4-5 hours with lean proteins, complex carbs and healthy fats. Minimize simple carbs  DIABETES MELLITUS, TYPE II hgba1c acceptable, minimize simple carbs. Increase exercise as tolerated. Continue current meds  Musculoskeletal pain Right arm pain, following with [facility] and has undergone some PT with some improvement. Will call if worsens

## 2013-11-15 NOTE — Assessment & Plan Note (Signed)
Encouraged DASH diet, decrease po intake and increase exercise as tolerated. Needs 7-8 hours of sleep nightly. Avoid trans fats, eat small, frequent meals every 4-5 hours with lean proteins, complex carbs and healthy fats. Minimize simple carbs 

## 2013-11-15 NOTE — Assessment & Plan Note (Signed)
Avoid offending foods, start probiotics. Do not eat large meals in late evening and consider raising head of bed. Given rx for Protonix and is going to check and see if this is cheaper than Omeprazole.

## 2013-11-15 NOTE — Assessment & Plan Note (Signed)
hgba1c acceptable, minimize simple carbs. Increase exercise as tolerated. Continue current meds 

## 2013-11-21 ENCOUNTER — Other Ambulatory Visit: Payer: Self-pay | Admitting: Family Medicine

## 2013-11-23 NOTE — Telephone Encounter (Signed)
Pt left a message stating she doesn't need any refills at this time. Pt states she will discuss more at visit.

## 2013-11-23 NOTE — Telephone Encounter (Signed)
Left detailed message on pt's cell# to call and verify valsartan - hct refill request. Per review of record it looks like medication was discontinued? At 11/12/13 office visit but no notations were made in pt's office note re: the need to stop medication. Not sure if it was discontinued in error or if dose was changed?

## 2013-11-23 NOTE — Telephone Encounter (Signed)
Patient returned phone call. Best # [phone]

## 2014-01-07 LAB — HM DIABETES EYE EXAM

## 2014-01-19 ENCOUNTER — Encounter: Payer: Self-pay | Admitting: Family Medicine

## 2014-01-21 ENCOUNTER — Ambulatory Visit: Payer: BC Managed Care – PPO | Admitting: Family Medicine

## 2014-01-23 ENCOUNTER — Other Ambulatory Visit: Payer: Self-pay | Admitting: Family Medicine

## 2014-02-03 LAB — CBC
HCT: 35.7 % — ABNORMAL LOW (ref 36.0–46.0)
HEMOGLOBIN: 12.2 g/dL (ref 12.0–15.0)
MCH: 28.2 pg (ref 26.0–34.0)
MCHC: 34.2 g/dL (ref 30.0–36.0)
MCV: 82.6 fL (ref 78.0–100.0)
Platelets: 275 10*3/uL (ref 150–400)
RBC: 4.32 MIL/uL (ref 3.87–5.11)
RDW: 14.2 % (ref 11.5–15.5)
WBC: 8.3 10*3/uL (ref 4.0–10.5)

## 2014-02-04 LAB — HEPATIC FUNCTION PANEL
ALK PHOS: 72 U/L (ref 39–117)
ALT: 14 U/L (ref 0–35)
AST: 11 U/L (ref 0–37)
Albumin: 4.3 g/dL (ref 3.5–5.2)
BILIRUBIN INDIRECT: 0.1 mg/dL — AB (ref 0.2–1.2)
Bilirubin, Direct: 0.1 mg/dL (ref 0.0–0.3)
TOTAL PROTEIN: 7.3 g/dL (ref 6.0–8.3)
Total Bilirubin: 0.2 mg/dL (ref 0.2–1.2)

## 2014-02-04 LAB — RENAL FUNCTION PANEL
Albumin: 4.3 g/dL (ref 3.5–5.2)
BUN: 8 mg/dL (ref 6–23)
CO2: 29 meq/L (ref 19–32)
Calcium: 9.4 mg/dL (ref 8.4–10.5)
Chloride: 99 mEq/L (ref 96–112)
Creat: 0.72 mg/dL (ref 0.50–1.10)
GLUCOSE: 77 mg/dL (ref 70–99)
POTASSIUM: 3.6 meq/L (ref 3.5–5.3)
Phosphorus: 3.6 mg/dL (ref 2.3–4.6)
Sodium: 137 mEq/L (ref 135–145)

## 2014-02-04 LAB — LIPID PANEL
Cholesterol: 147 mg/dL (ref 0–200)
HDL: 37 mg/dL — ABNORMAL LOW (ref >39)
LDL CALC: 89 mg/dL (ref 0–99)
Total CHOL/HDL Ratio: 4 Ratio
Triglycerides: 106 mg/dL (ref <150)
VLDL: 21 mg/dL (ref 0–40)

## 2014-02-04 LAB — HEMOGLOBIN A1C
Hgb A1c MFr Bld: 6.2 % — ABNORMAL HIGH (ref <5.7)
MEAN PLASMA GLUCOSE: 131 mg/dL — AB (ref <117)

## 2014-02-04 LAB — TSH: TSH: 1.718 u[IU]/mL (ref 0.350–4.500)

## 2014-02-09 ENCOUNTER — Ambulatory Visit: Payer: BC Managed Care – PPO | Admitting: Family Medicine

## 2014-03-08 ENCOUNTER — Telehealth: Payer: Self-pay | Admitting: Family Medicine

## 2014-03-08 NOTE — Telephone Encounter (Signed)
Please advise? It looks like a lot of tests were done in June?

## 2014-03-08 NOTE — Telephone Encounter (Signed)
Informed patient of this.  °

## 2014-03-08 NOTE — Telephone Encounter (Signed)
I agree had all routine labs in June and has to wait at least 3 months to repeat due to insurance. So just have her wait for labs

## 2014-03-08 NOTE — Telephone Encounter (Signed)
Does she need to have lab work prior to appt in August? Please advise

## 2014-03-08 NOTE — Telephone Encounter (Signed)
Please inform pt of md's note

## 2014-03-11 ENCOUNTER — Ambulatory Visit: Payer: BC Managed Care – PPO | Admitting: Family Medicine

## 2014-04-03 ENCOUNTER — Other Ambulatory Visit: Payer: Self-pay | Admitting: Family Medicine

## 2014-04-20 ENCOUNTER — Other Ambulatory Visit: Payer: Self-pay | Admitting: Family Medicine

## 2014-04-21 ENCOUNTER — Telehealth: Payer: Self-pay

## 2014-04-22 ENCOUNTER — Other Ambulatory Visit: Payer: Self-pay | Admitting: Family Medicine

## 2014-04-22 ENCOUNTER — Telehealth: Payer: Self-pay

## 2014-04-22 DIAGNOSIS — I10 Essential (primary) hypertension: Secondary | ICD-10-CM

## 2014-04-22 MED ORDER — VALSARTAN-HYDROCHLOROTHIAZIDE 160-12.5 MG PO TABS: 1.0000 | ORAL_TABLET | Freq: Every day | ORAL | Status: DC

## 2014-04-22 NOTE — Telephone Encounter (Signed)
Left message on voice mail that Rx was sent to [facility]. LDM

## 2014-04-22 NOTE — Telephone Encounter (Signed)
Sent in Valsartan hct to [facility] please notify patient

## 2014-04-29 ENCOUNTER — Ambulatory Visit: Payer: BC Managed Care – PPO | Admitting: Family Medicine

## 2014-05-04 ENCOUNTER — Ambulatory Visit: Payer: BC Managed Care – PPO | Admitting: Family Medicine

## 2014-05-11 ENCOUNTER — Ambulatory Visit (INDEPENDENT_AMBULATORY_CARE_PROVIDER_SITE_OTHER): Payer: BC Managed Care – PPO | Admitting: Family Medicine

## 2014-05-11 ENCOUNTER — Encounter: Payer: Self-pay | Admitting: Family Medicine

## 2014-05-11 VITALS — BP 124/80 | HR 97 | Temp 98.6°F | Ht 70.0 in | Wt 320.0 lb

## 2014-05-11 DIAGNOSIS — Z23 Encounter for immunization: Secondary | ICD-10-CM

## 2014-05-11 DIAGNOSIS — J309 Allergic rhinitis, unspecified: Secondary | ICD-10-CM

## 2014-05-11 DIAGNOSIS — E119 Type 2 diabetes mellitus without complications: Secondary | ICD-10-CM

## 2014-05-11 DIAGNOSIS — K219 Gastro-esophageal reflux disease without esophagitis: Secondary | ICD-10-CM

## 2014-05-11 DIAGNOSIS — E669 Obesity, unspecified: Secondary | ICD-10-CM

## 2014-05-11 DIAGNOSIS — D649 Anemia, unspecified: Secondary | ICD-10-CM

## 2014-05-11 DIAGNOSIS — I10 Essential (primary) hypertension: Secondary | ICD-10-CM

## 2014-05-11 MED ORDER — PANTOPRAZOLE SODIUM 40 MG PO TBEC: 40.0000 mg | DELAYED_RELEASE_TABLET | Freq: Every day | ORAL | Status: DC

## 2014-05-11 NOTE — Patient Instructions (Signed)
DASH Eating Plan °DASH stands for "Dietary Approaches to Stop Hypertension." The DASH eating plan is a healthy eating plan that has been shown to reduce high blood pressure (hypertension). Additional health benefits may include reducing the risk of type 2 diabetes mellitus, heart disease, and stroke. The DASH eating plan may also help with weight loss. °WHAT DO I NEED TO KNOW ABOUT THE DASH EATING PLAN? °For the DASH eating plan, you will follow these general guidelines: °· Choose foods with a percent daily value for sodium of less than 5% (as listed on the food label). °· Use salt-free seasonings or herbs instead of table salt or sea salt. °· Check with your health care provider or pharmacist before using salt substitutes. °· Eat lower-sodium products, often labeled as "lower sodium" or "no salt added." °· Eat fresh foods. °· Eat more vegetables, fruits, and low-fat dairy products. °· Choose whole grains. Look for the word "whole" as the first word in the ingredient list. °· Choose fish and skinless chicken or turkey more often than red meat. Limit fish, poultry, and meat to 6 oz (170 g) each day. °· Limit sweets, desserts, sugars, and sugary drinks. °· Choose heart-healthy fats. °· Limit cheese to 1 oz (28 g) per day. °· Eat more home-cooked food and less restaurant, buffet, and fast food. °· Limit fried foods. °· Cook foods using methods other than frying. °· Limit canned vegetables. If you do use them, rinse them well to decrease the sodium. °· When eating at a restaurant, ask that your food be prepared with less salt, or no salt if possible. °WHAT FOODS CAN I EAT? °Seek help from a dietitian for individual calorie needs. °Grains °Whole grain or whole wheat bread. Brown rice. Whole grain or whole wheat pasta. Quinoa, bulgur, and whole grain cereals. Low-sodium cereals. Corn or whole wheat flour tortillas. Whole grain cornbread. Whole grain crackers. Low-sodium crackers. °Vegetables °Fresh or frozen vegetables  (raw, steamed, roasted, or grilled). Low-sodium or reduced-sodium tomato and vegetable juices. Low-sodium or reduced-sodium tomato sauce and paste. Low-sodium or reduced-sodium canned vegetables.  °Fruits °All fresh, canned (in natural juice), or frozen fruits. °Meat and Other Protein Products °Ground beef (85% or leaner), grass-fed beef, or beef trimmed of fat. Skinless chicken or turkey. Ground chicken or turkey. Pork trimmed of fat. All fish and seafood. Eggs. Dried beans, peas, or lentils. Unsalted nuts and seeds. Unsalted canned beans. °Dairy °Low-fat dairy products, such as skim or 1% milk, 2% or reduced-fat cheeses, low-fat ricotta or cottage cheese, or plain low-fat yogurt. Low-sodium or reduced-sodium cheeses. °Fats and Oils °Tub margarines without trans fats. Light or reduced-fat mayonnaise and salad dressings (reduced sodium). Avocado. Safflower, olive, or canola oils. Natural peanut or almond butter. °Other °Unsalted popcorn and pretzels. °The items listed above may not be a complete list of recommended foods or beverages. Contact your dietitian for more options. °WHAT FOODS ARE NOT RECOMMENDED? °Grains °White bread. White pasta. White rice. Refined cornbread. Bagels and croissants. Crackers that contain trans fat. °Vegetables °Creamed or fried vegetables. Vegetables in a cheese sauce. Regular canned vegetables. Regular canned tomato sauce and paste. Regular tomato and vegetable juices. °Fruits °Dried fruits. Canned fruit in light or heavy syrup. Fruit juice. °Meat and Other Protein Products °Fatty cuts of meat. Ribs, chicken wings, bacon, sausage, bologna, salami, chitterlings, fatback, hot dogs, bratwurst, and packaged luncheon meats. Salted nuts and seeds. Canned beans with salt. °Dairy °Whole or 2% milk, cream, half-and-half, and cream cheese. Whole-fat or sweetened yogurt. Full-fat   cheeses or blue cheese. Nondairy creamers and whipped toppings. Processed cheese, cheese spreads, or cheese  curds. °Condiments °Onion and garlic salt, seasoned salt, table salt, and sea salt. Canned and packaged gravies. Worcestershire sauce. Tartar sauce. Barbecue sauce. Teriyaki sauce. Soy sauce, including reduced sodium. Steak sauce. Fish sauce. Oyster sauce. Cocktail sauce. Horseradish. Ketchup and mustard. Meat flavorings and tenderizers. Bouillon cubes. Hot sauce. Tabasco sauce. Marinades. Taco seasonings. Relishes. °Fats and Oils °Butter, stick margarine, lard, shortening, ghee, and bacon fat. Coconut, palm kernel, or palm oils. Regular salad dressings. °Other °Pickles and olives. Salted popcorn and pretzels. °The items listed above may not be a complete list of foods and beverages to avoid. Contact your dietitian for more information. °WHERE CAN I FIND MORE INFORMATION? °National Heart, Lung, and [employer] Institute: www.nhlbi.nih.gov/health/health-topics/topics/dash/ °Document Released: 08/09/2011 Document Revised: 01/04/2014 Document Reviewed: 06/24/2013 °ExitCare® Patient Information ©2015 [employer]. This information is not intended to replace advice given to you by your health care provider. Make sure you discuss any questions you have with your health care provider. ° °

## 2014-05-11 NOTE — Progress Notes (Signed)
Pre visit review using our clinic review tool, if applicable. No additional management support is needed unless otherwise documented below in the visit note. 

## 2014-05-14 ENCOUNTER — Telehealth: Payer: Self-pay

## 2014-05-14 NOTE — Telephone Encounter (Signed)
PA for Pantoprazole faxed

## 2014-05-16 NOTE — Assessment & Plan Note (Signed)
Following with allergist carries epipen

## 2014-05-16 NOTE — Assessment & Plan Note (Signed)
Mild, Increase leafy greens, consider increased lean red meat and using cast iron cookware. Continue to monitor, report any concerns 

## 2014-05-16 NOTE — Assessment & Plan Note (Signed)
hgba1c acceptable, minimize simple carbs. Increase exercise as tolerated. Continue current meds 

## 2014-05-16 NOTE — Assessment & Plan Note (Signed)
Poorly controlled will alter medications, encouraged DASH diet, minimize caffeine and obtain adequate sleep. Report concerning symptoms and follow up as directed and as needed. Did not take Amlodipine today, will start

## 2014-05-16 NOTE — Assessment & Plan Note (Signed)
Encouraged DASH diet, decrease po intake and increase exercise as tolerated. Needs 7-8 hours of sleep nightly. Avoid trans fats, eat small, frequent meals every 4-5 hours with lean proteins, complex carbs and healthy fats. Minimize simple carbs, GMO foods. 

## 2014-05-16 NOTE — Progress Notes (Signed)
Patient ID: Brandi Lester, female   DOB: Aug 13, 1966, 48 y.o.   MRN: [id number] Brandi Lester [id number] [birth year] 05/16/2014      Progress Note-Follow Up  Subjective  Chief Complaint  Chief Complaint  Patient presents with  . Follow-up    3 month    HPI  Patient is a 48 year old female in today for routine medical care. Patient is in today for followup. Has been struggling with allergies and is following with allergist. She is also struggling with worsening heartburn. Has been having trouble with her insurance getting pantoprazole. Has failed numerous other agents in the past. Has been using ginger, turmeric and vinegar with some improvement. Has had some recent migraine headaches but Excedrin migraine has been helpful. Has been trying to exercise regularly eat right and stay hydrated. Denies CP/palp/SOB/HA/congestion/fevers/GU c/o. Taking meds as prescribed  Past Medical History  Diagnosis Date  . Allergy     allergic rhinitis  . Hypertension   . Diabetes mellitus     Type II  . Obesity   . Fungus infection 10/13    Left great toe  . Ingrown nail 10/13    right foot next to the last toe  . Insomnia 04/20/2013  . Anemia 07/25/2013  . Dyslipidemia 07/25/2013    Past Surgical History  Procedure Laterality Date  . Lasik      eye surgery    Family History  Problem Relation Age of Onset  . Heart attack Mother 63  . Cancer Father     prostate  . Heart attack Sister 7  . Hypertension Other     History   Social History  . Marital Status: Single    Spouse Name: N/A    Number of Children: N/A  . Years of Education: N/A   Occupational History  . Not on file.   Social History Main Topics  . Smoking status: Former Research scientist (life sciences)  . Smokeless tobacco: Never Used  . Alcohol Use: Yes     Comment: rare  . Drug Use: Not on file  . Sexual Activity: Not on file   Other Topics Concern  . Not on file   Social History Narrative  . No narrative on file    Current  Outpatient Prescriptions on File Prior to Visit  Medication Sig Dispense Refill  . amLODipine (NORVASC) 5 MG tablet TAKE 1 TABLET DAILY  90 tablet  0  . azelastine (ASTELIN) 137 MCG/SPRAY nasal spray Place 2 sprays into both nostrils 2 (two) times daily. Use in each nostril as directed       . Epinastine HCl 0.05 % ophthalmic solution Place 1 drop into both eyes as needed.      [name] EPIPEN 2-PAK 0.3 MG/0.3ML SOAJ injection       . fexofenadine (ALLEGRA) 180 MG tablet Take 1 tablet (180 mg total) by mouth daily as needed for allergies or rhinitis.  30 tablet  5  . fluticasone (FLONASE) 50 MCG/ACT nasal spray Place 2 sprays into both nostrils daily.  16 g  5  . valsartan-hydrochlorothiazide (DIOVAN HCT) 160-12.5 MG per tablet Take 1 tablet by mouth daily.  30 tablet  3   No current facility-administered medications on file prior to visit.    Allergies  Allergen Reactions  . Penicillins     REACTION: lip tingling    Review of Systems  Review of Systems  Constitutional: Negative for fever and malaise/fatigue.  HENT: Negative for congestion.   Eyes: Negative for discharge.  Respiratory: Negative for shortness of breath.   Cardiovascular: Negative for chest pain, palpitations and leg swelling.  Gastrointestinal: Positive for heartburn. Negative for nausea, abdominal pain and diarrhea.  Genitourinary: Negative for dysuria.  Musculoskeletal: Negative for falls.  Skin: Negative for rash.  Neurological: Negative for loss of consciousness and headaches.  Endo/Heme/Allergies: Negative for polydipsia.  Psychiatric/Behavioral: Negative for depression and suicidal ideas. The patient is not nervous/anxious and does not have insomnia.     Objective  BP 124/80  Pulse 97  Temp(Src) 98.6 F (37 C) (Oral)  Ht 5\' 10"  (1.778 m)  Wt 320 lb (145.151 kg)  BMI 45.92 kg/m2  SpO2 97%  Physical Exam  Physical Exam  Constitutional: She is oriented to person, place, and time and well-developed,  well-nourished, and in no distress. No distress.  HENT:  Head: Normocephalic and atraumatic.  Eyes: Conjunctivae are normal.  Neck: Neck supple. No thyromegaly present.  Cardiovascular: Normal rate, regular rhythm and normal heart sounds.   No murmur heard. Pulmonary/Chest: Effort normal and breath sounds normal. She has no wheezes.  Abdominal: She exhibits no distension and no mass.  Musculoskeletal: She exhibits no edema.  Lymphadenopathy:    She has no cervical adenopathy.  Neurological: She is alert and oriented to person, place, and time.  Skin: Skin is warm and dry. No rash noted. She is not diaphoretic.  Psychiatric: Memory, affect and judgment normal.    Lab Results  Component Value Date   TSH 1.718 02/03/2014   Lab Results  Component Value Date   WBC 8.3 02/03/2014   HGB 12.2 02/03/2014   HCT 35.7* 02/03/2014   MCV 82.6 02/03/2014   PLT 275 02/03/2014   Lab Results  Component Value Date   CREATININE 0.72 02/03/2014   BUN 8 02/03/2014   NA 137 02/03/2014   K 3.6 02/03/2014   CL 99 02/03/2014   CO2 29 02/03/2014   Lab Results  Component Value Date   ALT 14 02/03/2014   AST 11 02/03/2014   ALKPHOS 72 02/03/2014   BILITOT 0.2 02/03/2014   Lab Results  Component Value Date   CHOL 147 02/03/2014   Lab Results  Component Value Date   HDL 37* 02/03/2014   Lab Results  Component Value Date   LDLCALC 89 02/03/2014   Lab Results  Component Value Date   TRIG 106 02/03/2014   Lab Results  Component Value Date   CHOLHDL 4.0 02/03/2014     Assessment & Plan  HYPERTENSION Poorly controlled will alter medications, encouraged DASH diet, minimize caffeine and obtain adequate sleep. Report concerning symptoms and follow up as directed and as needed. Did not take Amlodipine today, will start  OBESITY, UNSPECIFIED Encouraged DASH diet, decrease po intake and increase exercise as tolerated. Needs 7-8 hours of sleep nightly. Avoid trans fats, eat small, frequent meals every 4-5 hours with lean  proteins, complex carbs and healthy fats. Minimize simple carbs, GMO foods.  ALLERGIC RHINITIS Following with allergist carries epipen  DIABETES MELLITUS, TYPE II hgba1c acceptable, minimize simple carbs. Increase exercise as tolerated. Continue current meds  Anemia Mild, Increase leafy greens, consider increased lean red meat and using cast iron cookware. Continue to monitor, report any concerns  Gastro-esophageal reflux Failed Pepcid, Zantac, Mylanta, Prevacid, Prilosec 20 and 40 mg daily. Given rx for Pantoprazole

## 2014-05-16 NOTE — Assessment & Plan Note (Signed)
Failed Pepcid, Zantac, Mylanta, Prevacid, Prilosec 20 and 40 mg daily. Given rx for Pantoprazole

## 2014-05-17 ENCOUNTER — Other Ambulatory Visit: Payer: Self-pay | Admitting: Obstetrics and Gynecology

## 2014-05-17 DIAGNOSIS — R928 Other abnormal and inconclusive findings on diagnostic imaging of breast: Secondary | ICD-10-CM

## 2014-05-26 ENCOUNTER — Ambulatory Visit
Admission: RE | Admit: 2014-05-26 | Discharge: 2014-05-26 | Disposition: A | Discharge status: Home / Self Care | Payer: BC Managed Care – PPO | Source: Ambulatory Visit | Attending: Obstetrics and Gynecology | Admitting: Obstetrics and Gynecology

## 2014-05-26 DIAGNOSIS — R928 Other abnormal and inconclusive findings on diagnostic imaging of breast: Secondary | ICD-10-CM

## 2014-05-28 NOTE — Telephone Encounter (Signed)
PA for Pantoprazole was approved from 04-25-14 through 05-25-15

## 2014-06-08 ENCOUNTER — Telehealth: Payer: Self-pay | Admitting: Lab

## 2014-06-08 ENCOUNTER — Other Ambulatory Visit (INDEPENDENT_AMBULATORY_CARE_PROVIDER_SITE_OTHER): Payer: BC Managed Care – PPO

## 2014-06-08 DIAGNOSIS — E119 Type 2 diabetes mellitus without complications: Secondary | ICD-10-CM

## 2014-06-08 DIAGNOSIS — E109 Type 1 diabetes mellitus without complications: Secondary | ICD-10-CM

## 2014-06-08 DIAGNOSIS — E785 Hyperlipidemia, unspecified: Secondary | ICD-10-CM

## 2014-06-08 DIAGNOSIS — I10 Essential (primary) hypertension: Secondary | ICD-10-CM

## 2014-06-08 DIAGNOSIS — E876 Hypokalemia: Secondary | ICD-10-CM

## 2014-06-08 NOTE — Telephone Encounter (Signed)
Dr Charlett Blake    This patient came into today for labs, could you please put orders in with a diagnosis code.   Brandi Lipkin T.

## 2014-06-09 ENCOUNTER — Encounter: Payer: Self-pay | Admitting: Family Medicine

## 2014-06-09 ENCOUNTER — Other Ambulatory Visit: Payer: Self-pay | Admitting: Family Medicine

## 2014-06-09 DIAGNOSIS — E876 Hypokalemia: Secondary | ICD-10-CM

## 2014-06-09 LAB — LIPID PANEL
Cholesterol: 153 mg/dL (ref 0–200)
HDL: 30.5 mg/dL — ABNORMAL LOW (ref >39.00)
LDL Cholesterol: 87 mg/dL (ref 0–99)
NONHDL: 122.5
Total CHOL/HDL Ratio: 5
Triglycerides: 178 mg/dL — ABNORMAL HIGH (ref 0.0–149.0)
VLDL: 35.6 mg/dL (ref 0.0–40.0)

## 2014-06-09 LAB — RENAL FUNCTION PANEL
Albumin: 3.4 g/dL — ABNORMAL LOW (ref 3.5–5.2)
BUN: 12 mg/dL (ref 6–23)
CO2: 29 mEq/L (ref 19–32)
Calcium: 9 mg/dL (ref 8.4–10.5)
Chloride: 100 mEq/L (ref 96–112)
Creatinine, Ser: 0.7 mg/dL (ref 0.4–1.2)
GFR: 117.76 mL/min (ref >60.00)
Glucose, Bld: 120 mg/dL — ABNORMAL HIGH (ref 70–99)
POTASSIUM: 2.9 meq/L — AB (ref 3.5–5.1)
Phosphorus: 2.8 mg/dL (ref 2.3–4.6)
Sodium: 136 mEq/L (ref 135–145)

## 2014-06-09 LAB — CBC WITH DIFFERENTIAL/PLATELET
BASOS ABS: 0 10*3/uL (ref 0.0–0.1)
Basophils Relative: 0.7 % (ref 0.0–3.0)
EOS ABS: 0.1 10*3/uL (ref 0.0–0.7)
Eosinophils Relative: 1.2 % (ref 0.0–5.0)
HCT: 36 % (ref 36.0–46.0)
Hemoglobin: 11.8 g/dL — ABNORMAL LOW (ref 12.0–15.0)
LYMPHS ABS: 2.1 10*3/uL (ref 0.7–4.0)
Lymphocytes Relative: 27.8 % (ref 12.0–46.0)
MCHC: 32.8 g/dL (ref 30.0–36.0)
MCV: 85.2 fl (ref 78.0–100.0)
MONO ABS: 0.4 10*3/uL (ref 0.1–1.0)
Monocytes Relative: 5 % (ref 3.0–12.0)
Neutro Abs: 4.9 10*3/uL (ref 1.4–7.7)
Neutrophils Relative %: 65.3 % (ref 43.0–77.0)
PLATELETS: 289 10*3/uL (ref 150.0–400.0)
RBC: 4.22 Mil/uL (ref 3.87–5.11)
RDW: 14 % (ref 11.5–15.5)
WBC: 7.5 10*3/uL (ref 4.0–10.5)

## 2014-06-09 LAB — HEPATIC FUNCTION PANEL
ALBUMIN: 3.4 g/dL — AB (ref 3.5–5.2)
ALK PHOS: 76 U/L (ref 39–117)
ALT: 15 U/L (ref 0–35)
AST: 14 U/L (ref 0–37)
BILIRUBIN DIRECT: 0 mg/dL (ref 0.0–0.3)
Total Bilirubin: 0.4 mg/dL (ref 0.2–1.2)
Total Protein: 7.4 g/dL (ref 6.0–8.3)

## 2014-06-09 LAB — HEMOGLOBIN A1C: HEMOGLOBIN A1C: 6.2 % (ref 4.6–6.5)

## 2014-06-09 LAB — TSH: TSH: 1.21 u[IU]/mL (ref 0.35–4.50)

## 2014-06-09 MED ORDER — POTASSIUM CHLORIDE CRYS ER 20 MEQ PO TBCR: EXTENDED_RELEASE_TABLET | ORAL | Status: DC

## 2014-06-10 NOTE — Addendum Note (Signed)
Addended by: Varney Daily on: 06/10/2014 05:07 PM   Modules accepted: Orders

## 2014-06-17 ENCOUNTER — Other Ambulatory Visit (INDEPENDENT_AMBULATORY_CARE_PROVIDER_SITE_OTHER): Payer: BC Managed Care – PPO

## 2014-06-17 DIAGNOSIS — E876 Hypokalemia: Secondary | ICD-10-CM

## 2014-06-18 ENCOUNTER — Telehealth: Payer: Self-pay | Admitting: Family Medicine

## 2014-06-18 ENCOUNTER — Encounter: Payer: Self-pay | Admitting: Family Medicine

## 2014-06-18 ENCOUNTER — Other Ambulatory Visit: Payer: Self-pay | Admitting: Family Medicine

## 2014-06-18 DIAGNOSIS — E876 Hypokalemia: Secondary | ICD-10-CM

## 2014-06-18 DIAGNOSIS — D649 Anemia, unspecified: Secondary | ICD-10-CM

## 2014-06-18 LAB — RENAL FUNCTION PANEL
Albumin: 3.4 g/dL — ABNORMAL LOW (ref 3.5–5.2)
BUN: 10 mg/dL (ref 6–23)
CO2: 26 mEq/L (ref 19–32)
Calcium: 9.2 mg/dL (ref 8.4–10.5)
Chloride: 102 mEq/L (ref 96–112)
Creatinine, Ser: 0.8 mg/dL (ref 0.4–1.2)
GFR: 102.21 mL/min (ref >60.00)
Glucose, Bld: 107 mg/dL — ABNORMAL HIGH (ref 70–99)
Phosphorus: 3.2 mg/dL (ref 2.3–4.6)
Potassium: 3.4 mEq/L — ABNORMAL LOW (ref 3.5–5.1)
Sodium: 136 mEq/L (ref 135–145)

## 2014-06-18 MED ORDER — POTASSIUM CHLORIDE CRYS ER 20 MEQ PO TBCR: 40.0000 meq | EXTENDED_RELEASE_TABLET | Freq: Every day | ORAL | Status: DC

## 2014-06-18 NOTE — Telephone Encounter (Signed)
Ordered cbc and renal prior to visit

## 2014-06-18 NOTE — Telephone Encounter (Signed)
Caller name: [name]  Relation to pt: self  Call back number: [phone]   Reason for call:   scheduled lab appointment 07/16/14 requesting orders

## 2014-07-02 ENCOUNTER — Other Ambulatory Visit: Payer: Self-pay | Admitting: Family Medicine

## 2014-07-16 ENCOUNTER — Other Ambulatory Visit (INDEPENDENT_AMBULATORY_CARE_PROVIDER_SITE_OTHER): Payer: BC Managed Care – PPO

## 2014-07-16 DIAGNOSIS — D649 Anemia, unspecified: Secondary | ICD-10-CM

## 2014-07-16 DIAGNOSIS — E876 Hypokalemia: Secondary | ICD-10-CM

## 2014-07-16 LAB — RENAL FUNCTION PANEL
Albumin: 3.4 g/dL — ABNORMAL LOW (ref 3.5–5.2)
BUN: 8 mg/dL (ref 6–23)
CALCIUM: 8.7 mg/dL (ref 8.4–10.5)
CO2: 21 meq/L (ref 19–32)
Chloride: 106 mEq/L (ref 96–112)
Creatinine, Ser: 0.7 mg/dL (ref 0.4–1.2)
GFR: 108.58 mL/min (ref >60.00)
Glucose, Bld: 130 mg/dL — ABNORMAL HIGH (ref 70–99)
PHOSPHORUS: 2.5 mg/dL (ref 2.3–4.6)
Potassium: 3.6 mEq/L (ref 3.5–5.1)
Sodium: 137 mEq/L (ref 135–145)

## 2014-07-16 LAB — CBC
HCT: 37.2 % (ref 36.0–46.0)
Hemoglobin: 12.2 g/dL (ref 12.0–15.0)
MCHC: 32.7 g/dL (ref 30.0–36.0)
MCV: 84.3 fl (ref 78.0–100.0)
Platelets: 315 10*3/uL (ref 150.0–400.0)
RBC: 4.41 Mil/uL (ref 3.87–5.11)
RDW: 14.5 % (ref 11.5–15.5)
WBC: 6.7 10*3/uL (ref 4.0–10.5)

## 2014-08-23 ENCOUNTER — Other Ambulatory Visit: Payer: Self-pay | Admitting: Family Medicine

## 2014-08-23 NOTE — Telephone Encounter (Signed)
Last filled: 04/22/14 Amt:  30, 3 refills Last seen 05/11/14  Med filled.

## 2014-08-30 ENCOUNTER — Other Ambulatory Visit: Payer: BC Managed Care – PPO

## 2014-09-09 ENCOUNTER — Encounter: Payer: Self-pay | Admitting: Family Medicine

## 2014-09-09 ENCOUNTER — Ambulatory Visit (INDEPENDENT_AMBULATORY_CARE_PROVIDER_SITE_OTHER): Payer: BC Managed Care – PPO | Admitting: Family Medicine

## 2014-09-09 VITALS — BP 115/77 | HR 90 | Temp 98.5°F | Ht 70.0 in | Wt 316.2 lb

## 2014-09-09 DIAGNOSIS — E1169 Type 2 diabetes mellitus with other specified complication: Secondary | ICD-10-CM

## 2014-09-09 DIAGNOSIS — R059 Cough, unspecified: Secondary | ICD-10-CM

## 2014-09-09 DIAGNOSIS — R05 Cough: Secondary | ICD-10-CM

## 2014-09-09 DIAGNOSIS — E782 Mixed hyperlipidemia: Secondary | ICD-10-CM

## 2014-09-09 DIAGNOSIS — K219 Gastro-esophageal reflux disease without esophagitis: Secondary | ICD-10-CM

## 2014-09-09 DIAGNOSIS — E119 Type 2 diabetes mellitus without complications: Secondary | ICD-10-CM

## 2014-09-09 DIAGNOSIS — I10 Essential (primary) hypertension: Secondary | ICD-10-CM

## 2014-09-09 DIAGNOSIS — E785 Hyperlipidemia, unspecified: Secondary | ICD-10-CM

## 2014-09-09 DIAGNOSIS — E669 Obesity, unspecified: Secondary | ICD-10-CM

## 2014-09-09 MED ORDER — BENZONATATE 200 MG PO CAPS: 200.0000 mg | ORAL_CAPSULE | Freq: Two times a day (BID) | ORAL | Status: DC | PRN

## 2014-09-09 MED ORDER — VALSARTAN-HYDROCHLOROTHIAZIDE 160-12.5 MG PO TABS: 1.0000 | ORAL_TABLET | Freq: Every day | ORAL | Status: DC

## 2014-09-09 MED ORDER — AMLODIPINE BESYLATE 5 MG PO TABS: 5.0000 mg | ORAL_TABLET | Freq: Every day | ORAL | Status: DC

## 2014-09-09 NOTE — Patient Instructions (Addendum)
Call insurance and confirm that they will pay for Prevnar/pneumonia shot since you are a diabetic  Labs at visit to include, lipid, cmp, cbc, tsh, hgba1c

## 2014-09-09 NOTE — Progress Notes (Signed)
Pre visit review using our clinic review tool, if applicable. No additional management support is needed unless otherwise documented below in the visit note. 

## 2014-09-10 LAB — COMPREHENSIVE METABOLIC PANEL
ALT: 13 U/L (ref 0–35)
AST: 15 U/L (ref 0–37)
Albumin: 4.2 g/dL (ref 3.5–5.2)
Alkaline Phosphatase: 69 U/L (ref 39–117)
BUN: 11 mg/dL (ref 6–23)
CHLORIDE: 107 meq/L (ref 96–112)
CO2: 23 meq/L (ref 19–32)
Calcium: 9.2 mg/dL (ref 8.4–10.5)
Creatinine, Ser: 0.8 mg/dL (ref 0.4–1.2)
GFR: 105.22 mL/min (ref >60.00)
GLUCOSE: 95 mg/dL (ref 70–99)
POTASSIUM: 3.5 meq/L (ref 3.5–5.1)
Sodium: 141 mEq/L (ref 135–145)
Total Bilirubin: 0.1 mg/dL — ABNORMAL LOW (ref 0.2–1.2)
Total Protein: 7.6 g/dL (ref 6.0–8.3)

## 2014-09-10 LAB — CBC
HCT: 36 % (ref 36.0–46.0)
Hemoglobin: 11.8 g/dL — ABNORMAL LOW (ref 12.0–15.0)
MCHC: 32.8 g/dL (ref 30.0–36.0)
MCV: 83.8 fl (ref 78.0–100.0)
PLATELETS: 289 10*3/uL (ref 150.0–400.0)
RBC: 4.29 Mil/uL (ref 3.87–5.11)
RDW: 14.2 % (ref 11.5–15.5)
WBC: 7.1 10*3/uL (ref 4.0–10.5)

## 2014-09-10 LAB — LIPID PANEL
CHOLESTEROL: 145 mg/dL (ref 0–200)
HDL: 26.7 mg/dL — AB (ref >39.00)
LDL Cholesterol: 87 mg/dL (ref 0–99)
NonHDL: 118.3
Total CHOL/HDL Ratio: 5
Triglycerides: 155 mg/dL — ABNORMAL HIGH (ref 0.0–149.0)
VLDL: 31 mg/dL (ref 0.0–40.0)

## 2014-09-10 LAB — TSH: TSH: 1 u[IU]/mL (ref 0.35–4.50)

## 2014-09-10 LAB — HEMOGLOBIN A1C: Hgb A1c MFr Bld: 6.5 % (ref 4.6–6.5)

## 2014-09-12 NOTE — Progress Notes (Signed)
Brandi Lester  [id number] July 25, 1966 09/12/2014      Progress Note-Follow Up  Subjective  Chief Complaint  Chief Complaint  Patient presents with  . Follow-up    HPI  Patient is a 49 y.o. female in today for routine medical care. She is in today in follow-up. Feels fairly well. She is remembering her potassium dose in the morning and often forgets the afternoon dose. Acknowledges less myalgias since starting the potassium. No recent illness. Denies CP/palp/SOB/HA/congestion/fevers/GI or GU c/o. Taking meds as prescribed  Past Medical History  Diagnosis Date  . Allergy     allergic rhinitis  . Hypertension   . Diabetes mellitus     Type II  . Obesity   . Fungus infection 10/13    Left great toe  . Ingrown nail 10/13    right foot next to the last toe  . Insomnia 04/20/2013  . Anemia 07/25/2013  . Dyslipidemia 07/25/2013    Past Surgical History  Procedure Laterality Date  . Lasik      eye surgery    Family History  Problem Relation Age of Onset  . Heart attack Mother 15  . Cancer Father     prostate  . Heart attack Sister 74  . Hypertension Other     History   Social History  . Marital Status: Single    Spouse Name: N/A    Number of Children: N/A  . Years of Education: N/A   Occupational History  . Not on file.   Social History Main Topics  . Smoking status: Former Research scientist (life sciences)  . Smokeless tobacco: Never Used  . Alcohol Use: Yes     Comment: rare  . Drug Use: Not on file  . Sexual Activity: Not on file   Other Topics Concern  . Not on file   Social History Narrative    Current Outpatient Prescriptions on File Prior to Visit  Medication Sig Dispense Refill  . azelastine (ASTELIN) 137 MCG/SPRAY nasal spray Place 2 sprays into both nostrils 2 (two) times daily. Use in each nostril as directed     . Epinastine HCl 0.05 % ophthalmic solution Place 1 drop into both eyes as needed.    [name] EPIPEN 2-PAK 0.3 MG/0.3ML SOAJ injection     . fexofenadine  (ALLEGRA) 180 MG tablet Take 1 tablet (180 mg total) by mouth daily as needed for allergies or rhinitis. 30 tablet 5  . fluticasone (FLONASE) 50 MCG/ACT nasal spray Place 2 sprays into both nostrils daily. 16 g 5  . pantoprazole (PROTONIX) 40 MG tablet Take 1 tablet (40 mg total) by mouth daily. Failed in past, Prevacid, Prilosec, Pepcid, Zantac and Mylanta 30 tablet 5  . potassium chloride SA (K-DUR,KLOR-CON) 20 MEQ tablet Take 2 tablets (40 mEq total) by mouth daily. 60 tablet 2   No current facility-administered medications on file prior to visit.    Allergies  Allergen Reactions  . Penicillins     REACTION: lip tingling    Review of Systems  Review of Systems  Constitutional: Negative for fever and malaise/fatigue.  HENT: Negative for congestion.   Eyes: Negative for discharge.  Respiratory: Negative for shortness of breath.   Cardiovascular: Negative for chest pain, palpitations and leg swelling.  Gastrointestinal: Negative for nausea, abdominal pain and diarrhea.  Genitourinary: Negative for dysuria.  Musculoskeletal: Negative for falls.  Skin: Negative for rash.  Neurological: Negative for loss of consciousness and headaches.  Endo/Heme/Allergies: Negative for polydipsia.  Psychiatric/Behavioral: Negative for  depression and suicidal ideas. The patient is not nervous/anxious and does not have insomnia.     Objective  BP 115/77 mmHg  Pulse 105  Temp(Src) 98.5 F (36.9 C) (Oral)  Ht 5\' 10"  (1.778 m)  Wt 316 lb 3.2 oz (143.427 kg)  BMI 45.37 kg/m2  SpO2 99%  LMP 08/19/2014  Physical Exam  Physical Exam  Constitutional: She is oriented to person, place, and time and well-developed, well-nourished, and in no distress. No distress.  HENT:  Head: Normocephalic and atraumatic.  Eyes: Conjunctivae are normal.  Neck: Neck supple. No thyromegaly present.  Cardiovascular: Normal rate, regular rhythm and normal heart sounds.   No murmur heard. Pulmonary/Chest: Effort  normal and breath sounds normal. She has no wheezes.  Abdominal: She exhibits no distension and no mass.  Musculoskeletal: She exhibits no edema.  Lymphadenopathy:    She has no cervical adenopathy.  Neurological: She is alert and oriented to person, place, and time.  Skin: Skin is warm and dry. No rash noted. She is not diaphoretic.  Psychiatric: Memory, affect and judgment normal.    Lab Results  Component Value Date   TSH 1.00 09/09/2014   Lab Results  Component Value Date   WBC 7.1 09/09/2014   HGB 11.8* 09/09/2014   HCT 36.0 09/09/2014   MCV 83.8 09/09/2014   PLT 289.0 09/09/2014   Lab Results  Component Value Date   CREATININE 0.8 09/09/2014   BUN 11 09/09/2014   NA 141 09/09/2014   K 3.5 09/09/2014   CL 107 09/09/2014   CO2 23 09/09/2014   Lab Results  Component Value Date   ALT 13 09/09/2014   AST 15 09/09/2014   ALKPHOS 69 09/09/2014   BILITOT 0.1* 09/09/2014   Lab Results  Component Value Date   CHOL 145 09/09/2014   Lab Results  Component Value Date   HDL 26.70* 09/09/2014   Lab Results  Component Value Date   LDLCALC 87 09/09/2014   Lab Results  Component Value Date   TRIG 155.0* 09/09/2014   Lab Results  Component Value Date   CHOLHDL 5 09/09/2014     Assessment & Plan  Essential hypertension Well controlled, no changes to meds. Encouraged heart healthy diet such as the DASH diet and exercise as tolerated.   Gastro-esophageal reflux Avoid offending foods, start probiotics. Do not eat large meals in late evening and consider raising head of bed.   Diabetes mellitus type 2 in obese hgba1c acceptable, minimize simple carbs. Increase exercise as tolerated. Continue current meds  Obesity Encouraged DASH diet, decrease po intake and increase exercise as tolerated. Needs 7-8 hours of sleep nightly. Avoid trans fats, eat small, frequent meals every 4-5 hours with lean proteins, complex carbs and healthy fats. Minimize simple carbs, GMO  foods.  Dyslipidemia Encouraged heart healthy diet, increase exercise, avoid trans fats, consider a krill oil cap daily

## 2014-09-12 NOTE — Assessment & Plan Note (Signed)
Encouraged DASH diet, decrease po intake and increase exercise as tolerated. Needs 7-8 hours of sleep nightly. Avoid trans fats, eat small, frequent meals every 4-5 hours with lean proteins, complex carbs and healthy fats. Minimize simple carbs, GMO foods. 

## 2014-09-12 NOTE — Assessment & Plan Note (Signed)
hgba1c acceptable, minimize simple carbs. Increase exercise as tolerated. Continue current meds 

## 2014-09-12 NOTE — Assessment & Plan Note (Signed)
Avoid offending foods, start probiotics. Do not eat large meals in late evening and consider raising head of bed.  

## 2014-09-12 NOTE — Assessment & Plan Note (Signed)
Encouraged heart healthy diet, increase exercise, avoid trans fats, consider a krill oil cap daily 

## 2014-09-12 NOTE — Assessment & Plan Note (Signed)
Well controlled, no changes to meds. Encouraged heart healthy diet such as the DASH diet and exercise as tolerated.  °

## 2014-09-23 ENCOUNTER — Other Ambulatory Visit: Payer: Self-pay | Admitting: Family Medicine

## 2014-09-23 NOTE — Telephone Encounter (Signed)
Last filled:  06/18/14 Amt: 60, 2 Last OV:  09/09/14  Med filled.

## 2014-11-01 ENCOUNTER — Other Ambulatory Visit: Payer: Self-pay | Admitting: Family Medicine

## 2014-11-01 ENCOUNTER — Encounter: Payer: Self-pay | Admitting: Family Medicine

## 2014-11-01 DIAGNOSIS — R059 Cough, unspecified: Secondary | ICD-10-CM

## 2014-11-01 DIAGNOSIS — R05 Cough: Secondary | ICD-10-CM

## 2014-11-01 MED ORDER — BENZONATATE 200 MG PO CAPS: 200.0000 mg | ORAL_CAPSULE | Freq: Two times a day (BID) | ORAL | Status: DC | PRN

## 2014-12-02 ENCOUNTER — Other Ambulatory Visit: Payer: Self-pay | Admitting: Obstetrics and Gynecology

## 2014-12-02 DIAGNOSIS — N63 Unspecified lump in unspecified breast: Secondary | ICD-10-CM

## 2014-12-08 ENCOUNTER — Ambulatory Visit
Admission: RE | Admit: 2014-12-08 | Discharge: 2014-12-08 | Disposition: A | Discharge status: Home / Self Care | Payer: BC Managed Care – PPO | Source: Ambulatory Visit | Attending: Obstetrics and Gynecology | Admitting: Obstetrics and Gynecology

## 2014-12-08 DIAGNOSIS — N63 Unspecified lump in unspecified breast: Secondary | ICD-10-CM

## 2014-12-09 ENCOUNTER — Ambulatory Visit (INDEPENDENT_AMBULATORY_CARE_PROVIDER_SITE_OTHER): Payer: BC Managed Care – PPO | Admitting: Family Medicine

## 2014-12-09 VITALS — BP 140/86 | HR 89 | Temp 98.4°F | Resp 16 | Ht 70.0 in | Wt 314.4 lb

## 2014-12-09 DIAGNOSIS — I10 Essential (primary) hypertension: Secondary | ICD-10-CM | POA: Diagnosis not present

## 2014-12-09 DIAGNOSIS — J309 Allergic rhinitis, unspecified: Secondary | ICD-10-CM

## 2014-12-09 DIAGNOSIS — K219 Gastro-esophageal reflux disease without esophagitis: Secondary | ICD-10-CM

## 2014-12-09 DIAGNOSIS — E1169 Type 2 diabetes mellitus with other specified complication: Secondary | ICD-10-CM

## 2014-12-09 DIAGNOSIS — E669 Obesity, unspecified: Secondary | ICD-10-CM | POA: Diagnosis not present

## 2014-12-09 DIAGNOSIS — E119 Type 2 diabetes mellitus without complications: Secondary | ICD-10-CM

## 2014-12-09 DIAGNOSIS — E785 Hyperlipidemia, unspecified: Secondary | ICD-10-CM

## 2014-12-09 NOTE — Patient Instructions (Addendum)
Curcumen/Turmeric caps 1 daily  Moist heat and gentle stretching  Salon Pas patches and gel  Sacroiliac Joint Dysfunction The sacroiliac joint connects the lower part of the spine (the sacrum) with the bones of the pelvis. CAUSES  Sometimes, there is no obvious reason for sacroiliac joint dysfunction. Other times, it may occur   During pregnancy.  After injury, such as:  Car accidents.  Sport-related injuries.  Work-related injuries.  Due to one leg being shorter than the other.  Due to other conditions that affect the joints, such as:  Rheumatoid arthritis.  Gout.  Psoriasis.  Joint infection (septic arthritis). SYMPTOMS  Symptoms may include:  Pain in the:  Lower back.  Buttocks.  Groin.  Thighs and legs.  Difficult sitting, standing, walking, lying, bending or lifting. DIAGNOSIS  A number of tests may be used to help diagnose the cause of sacroiliac joint dysfunction, including:  Imaging tests to look for other causes of pain, including:  MRI.  CT scan.  Bone scan.  Diagnostic injection: During a special x-ray (called fluoroscopy), a needle is put into the sacroiliac joint. A numbing medicine is injected into the joint. If the pain is improved or stopped, the diagnosis of sacroiliac joint dysfunction is more likely. TREATMENT  There are a number of types of treatment used for sacroiliac joint dysfunction, including:  Only take over-the-counter or prescription medicines for pain, discomfort, or fever as directed by your caregiver.  Medications to relax muscles.  Rest. Decreasing activity can help cut down on painful muscle spasms and allow the back to heal.  Application of heat or ice to the lower back may improve muscle spasms and soothe pain.  Brace. A special back brace, called a sacroiliac belt, can help support the joint while your back is healing.  Physical therapy can help teach comfortable positions and exercises to strengthen muscles  that support the sacroiliac joint.  Cortisone injections. Injections of steroid medicine into the joint can help decrease swelling and improve pain.  Hyaluronic acid injections. This chemical improves lubrication within the sacroiliac joint, thereby decreasing pain.  Radiofrequency ablation. A special needle is placed into the joint, where it burns away nerves that are carrying pain messages from the joint.  Surgery. Because pain occurs during movement of the joint, screws and plates may be installed in order to limit or prevent joint motion. HOME CARE INSTRUCTIONS   Take all medications exactly as directed.  Follow instructions regarding both rest and physical activity, to avoid worsening the pain.  Do physical therapy exercises exactly as prescribed. SEEK IMMEDIATE MEDICAL CARE IF:  You experience increasingly severe pain.  You develop new symptoms, such as numbness or tingling in your legs or feet.  You lose bladder or bowel control. Document Released: 11/16/2008 Document Revised: 11/12/2011 Document Reviewed: 11/16/2008 [facility] Patient Information 2015 [place], [place]. This information is not intended to replace advice given to you by your health care provider. Make sure you discuss any questions you have with your health care provider.

## 2014-12-09 NOTE — Progress Notes (Signed)
Pre visit review using our clinic review tool, if applicable. No additional management support is needed unless otherwise documented below in the visit note. 

## 2014-12-13 ENCOUNTER — Encounter: Payer: Self-pay | Admitting: Family Medicine

## 2014-12-13 NOTE — Assessment & Plan Note (Signed)
Well controlled, no changes to meds. Encouraged heart healthy diet such as the DASH diet and exercise as tolerated. Improved on recheck 

## 2014-12-13 NOTE — Assessment & Plan Note (Signed)
hgba1c acceptable, minimize simple carbs. Increase exercise as tolerated. Continue current meds 

## 2014-12-13 NOTE — Progress Notes (Signed)
Brandi Lester  [id number] 1966/01/19 12/13/2014      Progress Note-Follow Up  Subjective  Chief Complaint  Chief Complaint  Patient presents with  . Follow-up    DM, HTN    HPI  Patient is a 49 y.o. female in today for routine medical care. She is reporting some increased headache secondary to allergies recently. Notes some nasal congestion but denies any ear pain or sore throat. Does have some postnasal drip. Notes blood sugars have been ranging in the 120 to 1:30 range. She has been doing increased exercise with [employer] etc. Notes some increased stiffness and pain in her knees and low back as a result. Symptoms are manageable. No recent illness. Denies CP/palp/SOB/HA/congestion/fevers/GI or GU c/o. Taking meds as prescribed  Past Medical History  Diagnosis Date  . Allergy     allergic rhinitis  . Hypertension   . Diabetes mellitus     Type II  . Obesity   . Fungus infection 10/13    Left great toe  . Ingrown nail 10/13    right foot next to the last toe  . Insomnia 04/20/2013  . Anemia 07/25/2013  . Dyslipidemia 07/25/2013    Past Surgical History  Procedure Laterality Date  . Lasik      eye surgery    Family History  Problem Relation Age of Onset  . Heart attack Mother 39  . Cancer Father     prostate  . Heart attack Sister 68  . Hypertension Other     History   Social History  . Marital Status: Single    Spouse Name: N/A  . Number of Children: N/A  . Years of Education: N/A   Occupational History  . Not on file.   Social History Main Topics  . Smoking status: Former Research scientist (life sciences)  . Smokeless tobacco: Never Used  . Alcohol Use: Yes     Comment: rare  . Drug Use: Yes    Special: Methylphenidate  . Sexual Activity: Not on file   Other Topics Concern  . Not on file   Social History Narrative    Current Outpatient Prescriptions on File Prior to Visit  Medication Sig Dispense Refill  . amLODipine (NORVASC) 5 MG tablet Take 1 tablet (5 mg  total) by mouth daily. 90 tablet 1  . fexofenadine (ALLEGRA) 180 MG tablet Take 1 tablet (180 mg total) by mouth daily as needed for allergies or rhinitis. 30 tablet 5  . fluticasone (FLONASE) 50 MCG/ACT nasal spray Place 2 sprays into both nostrils daily. 16 g 5  . pantoprazole (PROTONIX) 40 MG tablet Take 1 tablet (40 mg total) by mouth daily. Failed in past, Prevacid, Prilosec, Pepcid, Zantac and Mylanta 30 tablet 5  . potassium chloride SA (K-DUR,KLOR-CON) 20 MEQ tablet TAKE 2 TABLETS (40 MEQ) BY MOUTH DAILY. 60 tablet 2  . valsartan-hydrochlorothiazide (DIOVAN-HCT) 160-12.5 MG per tablet Take 1 tablet by mouth daily. 90 tablet 1  . azelastine (ASTELIN) 137 MCG/SPRAY nasal spray Place 2 sprays into both nostrils 2 (two) times daily. Use in each nostril as directed     . benzonatate (TESSALON) 200 MG capsule Take 1 capsule (200 mg total) by mouth 2 (two) times daily as needed for cough. (Patient not taking: Reported on 12/09/2014) 40 capsule 1  . Epinastine HCl 0.05 % ophthalmic solution Place 1 drop into both eyes as needed.    [name] EPIPEN 2-PAK 0.3 MG/0.3ML SOAJ injection      No current facility-administered medications on  file prior to visit.    Allergies  Allergen Reactions  . Penicillins     REACTION: lip tingling    Review of Systems  Review of Systems  Constitutional: Negative for fever and malaise/fatigue.  HENT: Negative for congestion.   Eyes: Negative for discharge.  Respiratory: Negative for shortness of breath.   Cardiovascular: Negative for chest pain, palpitations and leg swelling.  Gastrointestinal: Negative for nausea, abdominal pain and diarrhea.  Genitourinary: Negative for dysuria.  Musculoskeletal: Positive for back pain and joint pain. Negative for falls.  Skin: Negative for rash.  Neurological: Negative for loss of consciousness and headaches.  Endo/Heme/Allergies: Negative for polydipsia.  Psychiatric/Behavioral: Negative for depression and suicidal ideas. The  patient is not nervous/anxious and does not have insomnia.     Objective  BP 140/86 mmHg  Pulse 89  Temp(Src) 98.4 F (36.9 C) (Oral)  Resp 16  Ht 5\' 10"  (1.778 m)  Wt 314 lb 6.4 oz (142.611 kg)  BMI 45.11 kg/m2  SpO2 100%  Physical Exam  Physical Exam  Constitutional: She is oriented to person, place, and time and well-developed, well-nourished, and in no distress. No distress.  HENT:  Head: Normocephalic and atraumatic.  Eyes: Conjunctivae are normal.  Neck: Neck supple. No thyromegaly present.  Cardiovascular: Normal rate, regular rhythm and normal heart sounds.   No murmur heard. Pulmonary/Chest: Effort normal and breath sounds normal. She has no wheezes.  Abdominal: She exhibits no distension and no mass.  Musculoskeletal: She exhibits no edema.  Lymphadenopathy:    She has no cervical adenopathy.  Neurological: She is alert and oriented to person, place, and time.  Skin: Skin is warm and dry. No rash noted. She is not diaphoretic.  Psychiatric: Memory, affect and judgment normal.    Lab Results  Component Value Date   TSH 1.00 09/09/2014   Lab Results  Component Value Date   WBC 7.1 09/09/2014   HGB 11.8* 09/09/2014   HCT 36.0 09/09/2014   MCV 83.8 09/09/2014   PLT 289.0 09/09/2014   Lab Results  Component Value Date   CREATININE 0.8 09/09/2014   BUN 11 09/09/2014   NA 141 09/09/2014   K 3.5 09/09/2014   CL 107 09/09/2014   CO2 23 09/09/2014   Lab Results  Component Value Date   ALT 13 09/09/2014   AST 15 09/09/2014   ALKPHOS 69 09/09/2014   BILITOT 0.1* 09/09/2014   Lab Results  Component Value Date   CHOL 145 09/09/2014   Lab Results  Component Value Date   HDL 26.70* 09/09/2014   Lab Results  Component Value Date   LDLCALC 87 09/09/2014   Lab Results  Component Value Date   TRIG 155.0* 09/09/2014   Lab Results  Component Value Date   CHOLHDL 5 09/09/2014     Assessment & Plan  Essential hypertension Well controlled, no  changes to meds. Encouraged heart healthy diet such as the DASH diet and exercise as tolerated. Improved on recheck   Allergic rhinitis Recent increased congestion and headache. Encouraged antihistamines bid, nasal saline and Flonase.   Gastro-esophageal reflux Avoid offending foods, start probiotics. Do not eat large meals in late evening and consider raising head of bed.    Obesity Encouraged DASH diet, decrease po intake and increase exercise as tolerated. Needs 7-8 hours of sleep nightly. Avoid trans fats, eat small, frequent meals every 4-5 hours with lean proteins, complex carbs and healthy fats. Minimize simple carbs, GMO foods.   Dyslipidemia Encouraged  heart healthy diet, increase exercise, avoid trans fats, consider a krill oil cap daily   Diabetes mellitus type 2 in obese [id number] acceptable, minimize simple carbs. Increase exercise as tolerated. Continue current meds

## 2014-12-13 NOTE — Assessment & Plan Note (Signed)
Encouraged DASH diet, decrease po intake and increase exercise as tolerated. Needs 7-8 hours of sleep nightly. Avoid trans fats, eat small, frequent meals every 4-5 hours with lean proteins, complex carbs and healthy fats. Minimize simple carbs, GMO foods. 

## 2014-12-13 NOTE — Assessment & Plan Note (Signed)
Encouraged heart healthy diet, increase exercise, avoid trans fats, consider a krill oil cap daily 

## 2014-12-13 NOTE — Assessment & Plan Note (Signed)
Recent increased congestion and headache. Encouraged antihistamines bid, nasal saline and Flonase.

## 2014-12-13 NOTE — Assessment & Plan Note (Signed)
Avoid offending foods, start probiotics. Do not eat large meals in late evening and consider raising head of bed.  

## 2014-12-31 ENCOUNTER — Encounter: Payer: Self-pay | Admitting: Family Medicine

## 2015-01-06 ENCOUNTER — Other Ambulatory Visit: Payer: Self-pay | Admitting: Family Medicine

## 2015-01-10 ENCOUNTER — Telehealth: Payer: Self-pay | Admitting: *Deleted

## 2015-01-10 NOTE — Telephone Encounter (Signed)
Prior authorization for pantoprazole completed and [facility] states PA is not required. JG//CMA

## 2015-01-13 ENCOUNTER — Encounter: Payer: Self-pay | Admitting: Family Medicine

## 2015-01-21 ENCOUNTER — Other Ambulatory Visit: Payer: Self-pay | Admitting: Family Medicine

## 2015-01-21 ENCOUNTER — Telehealth: Payer: Self-pay | Admitting: Family Medicine

## 2015-01-21 MED ORDER — AMLODIPINE BESYLATE 5 MG PO TABS: 5.0000 mg | ORAL_TABLET | Freq: Two times a day (BID) | ORAL | Status: DC

## 2015-01-21 NOTE — Telephone Encounter (Signed)
Patient has been informed of PCP instructions.  She did agree to all and sent in new bp prescripiton to [facility].  The patient has an appt. Already with Dr. Charlett Blake in July but will call back later to try and move it to June.

## 2015-01-21 NOTE — Telephone Encounter (Signed)
Sent in new prescription for amlodipine 5 mg bid per pcp instructions.

## 2015-01-21 NOTE — Telephone Encounter (Signed)
Please respond to this patients mychart message to you on 01/13/15.

## 2015-01-21 NOTE — Telephone Encounter (Signed)
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 Please schedule. THX Dr B    ----- Message -----     From: [name]     Sent: 01/21/2015 12:20 PM      To: Mosie Lukes, MD    Subject: Non-Urgent Medical Question                 ----- Message from Jamesetta Orleans, [place] sent at 01/21/2015 12:20 PM EDT -----            ----- Message from [name] to Mosie Lukes, MD sent at 01/13/2015 11:43 PM -----     Hello,  I have been monitoring my blood pressure for the past 3 weeks. My blood pressure is in the range of 140/90 to 160/98. Should this be cause for concern? Do I need to try to increase the dose on my pills? What do you suggest?  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 Thank you

## 2015-02-10 ENCOUNTER — Encounter: Payer: Self-pay | Admitting: Family Medicine

## 2015-02-10 ENCOUNTER — Ambulatory Visit (INDEPENDENT_AMBULATORY_CARE_PROVIDER_SITE_OTHER): Payer: BC Managed Care – PPO | Admitting: Family Medicine

## 2015-02-10 VITALS — BP 132/84 | HR 84 | Temp 99.3°F | Ht 70.0 in | Wt 317.4 lb

## 2015-02-10 DIAGNOSIS — E119 Type 2 diabetes mellitus without complications: Secondary | ICD-10-CM | POA: Diagnosis not present

## 2015-02-10 DIAGNOSIS — E1169 Type 2 diabetes mellitus with other specified complication: Secondary | ICD-10-CM

## 2015-02-10 DIAGNOSIS — I10 Essential (primary) hypertension: Secondary | ICD-10-CM

## 2015-02-10 DIAGNOSIS — E785 Hyperlipidemia, unspecified: Secondary | ICD-10-CM | POA: Diagnosis not present

## 2015-02-10 DIAGNOSIS — M940 Chondrocostal junction syndrome [Tietze]: Secondary | ICD-10-CM

## 2015-02-10 DIAGNOSIS — D649 Anemia, unspecified: Secondary | ICD-10-CM

## 2015-02-10 DIAGNOSIS — K219 Gastro-esophageal reflux disease without esophagitis: Secondary | ICD-10-CM

## 2015-02-10 DIAGNOSIS — E669 Obesity, unspecified: Secondary | ICD-10-CM

## 2015-02-10 DIAGNOSIS — R600 Localized edema: Secondary | ICD-10-CM

## 2015-02-10 MED ORDER — AMLODIPINE BESYLATE 5 MG PO TABS: 5.0000 mg | ORAL_TABLET | Freq: Every day | ORAL | Status: DC

## 2015-02-10 MED ORDER — METOPROLOL SUCCINATE ER 25 MG PO TB24: 25.0000 mg | ORAL_TABLET | Freq: Every day | ORAL | Status: DC

## 2015-02-10 MED ORDER — PANTOPRAZOLE SODIUM 40 MG PO TBEC
40.0000 mg | DELAYED_RELEASE_TABLET | Freq: Every day | ORAL | Status: DC
Start: 2015-02-10 — End: 2015-09-15

## 2015-02-10 NOTE — Progress Notes (Signed)
Brandi Lester  [id number] April 17, 1966 02/10/2015      Progress Note-Follow Up  Subjective  Chief Complaint  Chief Complaint  Patient presents with  . Follow-up    blood pressure and ankle swelling    HPI  Patient is a 49 y.o. female in today for routine medical care.  Patient is in today for follow-up. Has several complaints. One is intermittent pedal edema especially when she's been on her feet for long periods of time. Mild tenderness is noted. Results when she lies down. Is bilateral. Is also noting some chest wall pain but it occurs with movement post notably of her chest wall and her left arm. It is tender to palpation. She denies any injury or fall. There are no other associated symptoms. She denies any recent illness. Denies CP/palp/SOB/HA/congestion/fevers/GI or GU c/o. Taking meds as prescribed  Past Medical History  Diagnosis Date  . Allergy     allergic rhinitis  . Hypertension   . Diabetes mellitus     Type II  . Obesity   . Fungus infection 10/13    Left great toe  . Ingrown nail 10/13    right foot next to the last toe  . Insomnia 04/20/2013  . Anemia 07/25/2013  . Dyslipidemia 07/25/2013    Past Surgical History  Procedure Laterality Date  . Lasik      eye surgery    Family History  Problem Relation Age of Onset  . Heart attack Mother 45  . Cancer Father     prostate  . Heart attack Sister 1  . Hypertension Other     History   Social History  . Marital Status: Single    Spouse Name: N/A  . Number of Children: N/A  . Years of Education: N/A   Occupational History  . Not on file.   Social History Main Topics  . Smoking status: Former Research scientist (life sciences)  . Smokeless tobacco: Never Used  . Alcohol Use: Yes     Comment: rare  . Drug Use: Yes    Special: Methylphenidate  . Sexual Activity: Not on file   Other Topics Concern  . Not on file   Social History Narrative    Current Outpatient Prescriptions on File Prior to Visit  Medication Sig  Dispense Refill  . fexofenadine (ALLEGRA) 180 MG tablet Take 1 tablet (180 mg total) by mouth daily as needed for allergies or rhinitis. 30 tablet 5  . potassium chloride SA (K-DUR,KLOR-CON) 20 MEQ tablet TAKE 2 TABLETS (40 MEQ) BY MOUTH DAILY. 60 tablet 4  . valsartan-hydrochlorothiazide (DIOVAN-HCT) 160-12.5 MG per tablet Take 1 tablet by mouth daily. 90 tablet 1  . EPIPEN 2-PAK 0.3 MG/0.3ML SOAJ injection     . fluticasone (FLONASE) 50 MCG/ACT nasal spray Place 2 sprays into both nostrils daily. (Patient not taking: Reported on 02/10/2015) 16 g 5   No current facility-administered medications on file prior to visit.    Allergies  Allergen Reactions  . Penicillins     REACTION: lip tingling    Review of Systems  Review of Systems  Constitutional: Negative for fever and malaise/fatigue.  HENT: Negative for congestion.   Eyes: Negative for discharge.  Respiratory: Negative for shortness of breath.   Cardiovascular: Positive for chest pain and leg swelling. Negative for palpitations.  Gastrointestinal: Negative for nausea, abdominal pain and diarrhea.  Genitourinary: Negative for dysuria.  Musculoskeletal: Negative for falls.  Skin: Negative for rash.  Neurological: Negative for loss of consciousness and headaches.  Endo/Heme/Allergies: Negative for polydipsia.  Psychiatric/Behavioral: Negative for depression and suicidal ideas. The patient is not nervous/anxious and does not have insomnia.     Objective  BP 132/84 mmHg  Pulse 105  Temp(Src) 99.3 F (37.4 C) (Oral)  Ht 5\' 10"  (1.778 m)  Wt 317 lb 6 oz (143.96 kg)  BMI 45.54 kg/m2  SpO2 98%  LMP 01/28/2015  Physical Exam  Physical Exam  Constitutional: She is oriented to person, place, and time and well-developed, well-nourished, and in no distress. No distress.  HENT:  Head: Normocephalic and atraumatic.  Eyes: Conjunctivae are normal.  Neck: Neck supple. No thyromegaly present.  Cardiovascular: Normal rate, regular  rhythm and normal heart sounds.   No murmur heard. Pulmonary/Chest: Effort normal and breath sounds normal. She has no wheezes.  Abdominal: Soft. Bowel sounds are normal. She exhibits no distension and no mass.  Musculoskeletal: She exhibits edema and tenderness.  Left upper outer chest wall with muscle spasm tender with palp 1+ pedal edema b/l  Lymphadenopathy:    She has no cervical adenopathy.  Neurological: She is alert and oriented to person, place, and time.  Skin: Skin is warm and dry. No rash noted. She is not diaphoretic.  Psychiatric: Memory, affect and judgment normal.    Lab Results  Component Value Date   TSH 1.00 09/09/2014   Lab Results  Component Value Date   WBC 7.1 09/09/2014   HGB 11.8* 09/09/2014   HCT 36.0 09/09/2014   MCV 83.8 09/09/2014   PLT 289.0 09/09/2014   Lab Results  Component Value Date   CREATININE 0.8 09/09/2014   BUN 11 09/09/2014   NA 141 09/09/2014   K 3.5 09/09/2014   CL 107 09/09/2014   CO2 23 09/09/2014   Lab Results  Component Value Date   ALT 13 09/09/2014   AST 15 09/09/2014   ALKPHOS 69 09/09/2014   BILITOT 0.1* 09/09/2014   Lab Results  Component Value Date   CHOL 145 09/09/2014   Lab Results  Component Value Date   HDL 26.70* 09/09/2014   Lab Results  Component Value Date   LDLCALC 87 09/09/2014   Lab Results  Component Value Date   TRIG 155.0* 09/09/2014   Lab Results  Component Value Date   CHOLHDL 5 09/09/2014     Assessment & Plan  Essential hypertension Well controlled, no changes to meds. Encouraged heart healthy diet such as the DASH diet and exercise as tolerated.   Gastro-esophageal reflux Avoid offending foods, start probiotics. Do not eat large meals in late evening and consider raising head of bed.   Diabetes mellitus type 2 in obese minimize simple carbs. Increase exercise as tolerated. Continue current meds  Dyslipidemia Encouraged heart healthy diet, increase exercise, avoid trans  fats, consider a krill oil cap daily  Anemia Increase leafy greens, consider increased lean red meat and using cast iron cookware. Continue to monitor, report any concerns  Costochondritis Has some upper chest wall pain with movement of her chest and arm. Encouraged moist heat and topical treatments such as Salon Pas, seek care if worsens  Pedal edema Encouraged minimize sodium, elevate feet above heart. Use compression hose. Consider diuretics if worsens

## 2015-02-10 NOTE — Progress Notes (Signed)
Pre visit review using our clinic review tool, if applicable. No additional management support is needed unless otherwise documented below in the visit note. 

## 2015-02-10 NOTE — Patient Instructions (Signed)
Costochondritis Costochondritis, sometimes called Tietze syndrome, is a swelling and irritation (inflammation) of the tissue (cartilage) that connects your ribs with your breastbone (sternum). It causes pain in the chest and rib area. Costochondritis usually goes away on its own over time. It can take up to 6 weeks or longer to get better, especially if you are unable to limit your activities. CAUSES  Some cases of costochondritis have no known cause. Possible causes include:  Injury (trauma).  Exercise or activity such as lifting.  Severe coughing. SIGNS AND SYMPTOMS  Pain and tenderness in the chest and rib area.  Pain that gets worse when coughing or taking deep breaths.  Pain that gets worse with specific movements. DIAGNOSIS  Your health care provider will do a physical exam and ask about your symptoms. Chest X-rays or other tests may be done to rule out other problems. TREATMENT  Costochondritis usually goes away on its own over time. Your health care provider may prescribe medicine to help relieve pain. HOME CARE INSTRUCTIONS   Avoid exhausting physical activity. Try not to strain your ribs during normal activity. This would include any activities using chest, abdominal, and side muscles, especially if heavy weights are used.  Apply ice to the affected area for the first 2 days after the pain begins.  Put ice in a plastic bag.  Place a towel between your skin and the bag.  Leave the ice on for 20 minutes, 2-3 times a day.  Only take over-the-counter or prescription medicines as directed by your health care provider. SEEK MEDICAL CARE IF:  You have redness or swelling at the rib joints. These are signs of infection.  Your pain does not go away despite rest or medicine. SEEK IMMEDIATE MEDICAL CARE IF:   Your pain increases or you are very uncomfortable.  You have shortness of breath or difficulty breathing.  You cough up blood.  You have worse chest pains,  sweating, or vomiting.  You have a fever or persistent symptoms for more than 2-3 days.  You have a fever and your symptoms suddenly get worse. MAKE SURE YOU:   Understand these instructions.  Will watch your condition.  Will get help right away if you are not doing well or get worse. Document Released: 05/30/2005 Document Revised: 06/10/2013 Document Reviewed: 03/24/2013 [employer] Patient Information 2015 [employer]. This information is not intended to replace advice given to you by your health care provider. Make sure you discuss any questions you have with your health care provider.  

## 2015-02-20 ENCOUNTER — Encounter: Payer: Self-pay | Admitting: Family Medicine

## 2015-02-20 DIAGNOSIS — M940 Chondrocostal junction syndrome [Tietze]: Secondary | ICD-10-CM | POA: Insufficient documentation

## 2015-02-20 DIAGNOSIS — R600 Localized edema: Secondary | ICD-10-CM

## 2015-02-20 HISTORY — DX: Localized edema: R60.0

## 2015-02-20 HISTORY — DX: Chondrocostal junction syndrome (tietze): M94.0

## 2015-02-20 NOTE — Assessment & Plan Note (Signed)
Has some upper chest wall pain with movement of her chest and arm. Encouraged moist heat and topical treatments such as Jenel Lucks, seek care if worsens

## 2015-02-20 NOTE — Assessment & Plan Note (Signed)
minimize simple carbs. Increase exercise as tolerated. Continue current meds  

## 2015-02-20 NOTE — Assessment & Plan Note (Signed)
Well controlled, no changes to meds. Encouraged heart healthy diet such as the DASH diet and exercise as tolerated.  °

## 2015-02-20 NOTE — Assessment & Plan Note (Signed)
Encouraged minimize sodium, elevate feet above heart. Use compression hose. Consider diuretics if worsens

## 2015-02-20 NOTE — Assessment & Plan Note (Signed)
Increase leafy greens, consider increased lean red meat and using cast iron cookware. Continue to monitor, report any concerns 

## 2015-02-20 NOTE — Assessment & Plan Note (Signed)
Avoid offending foods, start probiotics. Do not eat large meals in late evening and consider raising head of bed.  

## 2015-02-20 NOTE — Assessment & Plan Note (Signed)
Encouraged heart healthy diet, increase exercise, avoid trans fats, consider a krill oil cap daily 

## 2015-02-28 ENCOUNTER — Other Ambulatory Visit: Payer: Self-pay

## 2015-03-03 ENCOUNTER — Other Ambulatory Visit (INDEPENDENT_AMBULATORY_CARE_PROVIDER_SITE_OTHER): Payer: BC Managed Care – PPO

## 2015-03-03 DIAGNOSIS — E669 Obesity, unspecified: Secondary | ICD-10-CM | POA: Diagnosis not present

## 2015-03-03 DIAGNOSIS — E119 Type 2 diabetes mellitus without complications: Secondary | ICD-10-CM

## 2015-03-03 DIAGNOSIS — E785 Hyperlipidemia, unspecified: Secondary | ICD-10-CM | POA: Diagnosis not present

## 2015-03-03 DIAGNOSIS — E1169 Type 2 diabetes mellitus with other specified complication: Secondary | ICD-10-CM

## 2015-03-03 DIAGNOSIS — I10 Essential (primary) hypertension: Secondary | ICD-10-CM | POA: Diagnosis not present

## 2015-03-03 LAB — COMPREHENSIVE METABOLIC PANEL
ALT: 10 U/L (ref 0–35)
AST: 11 U/L (ref 0–37)
Albumin: 3.8 g/dL (ref 3.5–5.2)
Alkaline Phosphatase: 68 U/L (ref 39–117)
BUN: 8 mg/dL (ref 6–23)
CO2: 30 mEq/L (ref 19–32)
CREATININE: 0.71 mg/dL (ref 0.40–1.20)
Calcium: 8.8 mg/dL (ref 8.4–10.5)
Chloride: 103 mEq/L (ref 96–112)
GFR: 113.58 mL/min (ref >60.00)
Glucose, Bld: 89 mg/dL (ref 70–99)
Potassium: 3.3 mEq/L — ABNORMAL LOW (ref 3.5–5.1)
Sodium: 139 mEq/L (ref 135–145)
Total Bilirubin: 0.4 mg/dL (ref 0.2–1.2)
Total Protein: 7.1 g/dL (ref 6.0–8.3)

## 2015-03-03 LAB — LIPID PANEL
Cholesterol: 145 mg/dL (ref 0–200)
HDL: 34.9 mg/dL — ABNORMAL LOW (ref >39.00)
LDL Cholesterol: 90 mg/dL (ref 0–99)
NONHDL: 110.1
Total CHOL/HDL Ratio: 4
Triglycerides: 101 mg/dL (ref 0.0–149.0)
VLDL: 20.2 mg/dL (ref 0.0–40.0)

## 2015-03-03 LAB — CBC
HCT: 37.2 % (ref 36.0–46.0)
Hemoglobin: 12.4 g/dL (ref 12.0–15.0)
MCHC: 33.4 g/dL (ref 30.0–36.0)
MCV: 83.7 fl (ref 78.0–100.0)
Platelets: 301 10*3/uL (ref 150.0–400.0)
RBC: 4.45 Mil/uL (ref 3.87–5.11)
RDW: 14.9 % (ref 11.5–15.5)
WBC: 7.1 10*3/uL (ref 4.0–10.5)

## 2015-03-03 LAB — TSH: TSH: 2.93 u[IU]/mL (ref 0.35–4.50)

## 2015-03-03 LAB — HEMOGLOBIN A1C: HEMOGLOBIN A1C: 5.9 % (ref 4.6–6.5)

## 2015-03-10 ENCOUNTER — Ambulatory Visit (INDEPENDENT_AMBULATORY_CARE_PROVIDER_SITE_OTHER): Payer: BC Managed Care – PPO | Admitting: Family Medicine

## 2015-03-10 ENCOUNTER — Encounter: Payer: Self-pay | Admitting: Family Medicine

## 2015-03-10 VITALS — BP 132/82 | HR 93 | Temp 99.0°F | Ht 70.0 in | Wt 313.5 lb

## 2015-03-10 DIAGNOSIS — E876 Hypokalemia: Secondary | ICD-10-CM | POA: Diagnosis not present

## 2015-03-10 DIAGNOSIS — I10 Essential (primary) hypertension: Secondary | ICD-10-CM

## 2015-03-10 DIAGNOSIS — E669 Obesity, unspecified: Secondary | ICD-10-CM

## 2015-03-10 DIAGNOSIS — T7840XA Allergy, unspecified, initial encounter: Secondary | ICD-10-CM | POA: Diagnosis not present

## 2015-03-10 MED ORDER — FLUTICASONE PROPIONATE 50 MCG/ACT NA SUSP: 2.0000 | Freq: Every day | NASAL | Status: DC

## 2015-03-10 MED ORDER — POTASSIUM CHLORIDE CRYS ER 20 MEQ PO TBCR: EXTENDED_RELEASE_TABLET | ORAL | Status: DC

## 2015-03-10 MED ORDER — VALSARTAN-HYDROCHLOROTHIAZIDE 160-12.5 MG PO TABS: 1.0000 | ORAL_TABLET | Freq: Every day | ORAL | Status: DC

## 2015-03-10 NOTE — Patient Instructions (Signed)
Probiotic daily such as [employer] or [facility] or online at [employer]. [place] makes a 10 strain, 1 cap daily   Hypertension Hypertension, commonly called high blood pressure, is when the force of blood pumping through your arteries is too strong. Your arteries are the blood vessels that carry blood from your heart throughout your body. A blood pressure reading consists of a higher number over a lower number, such as 110/72. The higher number (systolic) is the pressure inside your arteries when your heart pumps. The lower number (diastolic) is the pressure inside your arteries when your heart relaxes. Ideally you want your blood pressure below 120/80. Hypertension forces your heart to work harder to pump blood. Your arteries may become narrow or stiff. Having hypertension puts you at risk for heart disease, stroke, and other problems.  RISK FACTORS Some risk factors for high blood pressure are controllable. Others are not.  Risk factors you cannot control include:   Race. You may be at higher risk if you are African American.  Age. Risk increases with age.  Gender. Men are at higher risk than women before age 36 years. After age 49, women are at higher risk than men. Risk factors you can control include:  Not getting enough exercise or physical activity.  Being overweight.  Getting too much fat, sugar, calories, or salt in your diet.  Drinking too much alcohol. SIGNS AND SYMPTOMS Hypertension does not usually cause signs or symptoms. Extremely high blood pressure (hypertensive crisis) may cause headache, anxiety, shortness of breath, and nosebleed. DIAGNOSIS  To check if you have hypertension, your health care provider will measure your blood pressure while you are seated, with your arm held at the level of your heart. It should be measured at least twice using the same arm. Certain conditions can cause a difference in blood pressure between your right and  left arms. A blood pressure reading that is higher than normal on one occasion does not mean that you need treatment. If one blood pressure reading is high, ask your health care provider about having it checked again. TREATMENT  Treating high blood pressure includes making lifestyle changes and possibly taking medicine. Living a healthy lifestyle can help lower high blood pressure. You may need to change some of your habits. Lifestyle changes may include:  Following the DASH diet. This diet is high in fruits, vegetables, and whole grains. It is low in salt, red meat, and added sugars.  Getting at least 2 hours of brisk physical activity every week.  Losing weight if necessary.  Not smoking.  Limiting alcoholic beverages.  Learning ways to reduce stress. If lifestyle changes are not enough to get your blood pressure under control, your health care provider may prescribe medicine. You may need to take more than one. Work closely with your health care provider to understand the risks and benefits. HOME CARE INSTRUCTIONS  Have your blood pressure rechecked as directed by your health care provider.   Take medicines only as directed by your health care provider. Follow the directions carefully. Blood pressure medicines must be taken as prescribed. The medicine does not work as well when you skip doses. Skipping doses also puts you at risk for problems.   Do not smoke.   Monitor your blood pressure at home as directed by your health care provider. SEEK MEDICAL CARE IF:   You think you are having a reaction to medicines taken.  You have recurrent headaches or feel dizzy.  You have swelling in your ankles.  You have trouble with your vision. SEEK IMMEDIATE MEDICAL CARE IF:  You develop a severe headache or confusion.  You have unusual weakness, numbness, or feel faint.  You have severe chest or abdominal pain.  You vomit repeatedly.  You have trouble breathing. MAKE SURE  YOU:   Understand these instructions.  Will watch your condition.  Will get help right away if you are not doing well or get worse. Document Released: 08/20/2005 Document Revised: 01/04/2014 Document Reviewed: 06/12/2013 [facility] Patient Information 2015 [place], [place]. This information is not intended to replace advice given to you by your health care provider. Make sure you discuss any questions you have with your health care provider.

## 2015-03-10 NOTE — Progress Notes (Signed)
Pre visit review using our clinic review tool, if applicable. No additional management support is needed unless otherwise documented below in the visit note. 

## 2015-03-11 LAB — COMPREHENSIVE METABOLIC PANEL
ALBUMIN: 3.9 g/dL (ref 3.5–5.2)
ALT: 18 U/L (ref 0–35)
AST: 14 U/L (ref 0–37)
Alkaline Phosphatase: 73 U/L (ref 39–117)
BUN: 9 mg/dL (ref 6–23)
CALCIUM: 9.1 mg/dL (ref 8.4–10.5)
CHLORIDE: 103 meq/L (ref 96–112)
CO2: 29 mEq/L (ref 19–32)
CREATININE: 0.68 mg/dL (ref 0.40–1.20)
GFR: 119.37 mL/min (ref >60.00)
Glucose, Bld: 77 mg/dL (ref 70–99)
Potassium: 3.5 mEq/L (ref 3.5–5.1)
SODIUM: 139 meq/L (ref 135–145)
TOTAL PROTEIN: 7 g/dL (ref 6.0–8.3)
Total Bilirubin: 0.3 mg/dL (ref 0.2–1.2)

## 2015-03-20 NOTE — Assessment & Plan Note (Signed)
Encouraged DASH diet, decrease po intake and increase exercise as tolerated. Needs 7-8 hours of sleep nightly. Avoid trans fats, eat small, frequent meals every 4-5 hours with lean proteins, complex carbs and healthy fats. Minimize simple carbs. Mild weight loss noted.

## 2015-03-20 NOTE — Progress Notes (Signed)
Brandi Lester  [id number] [birth year] 03/20/2015      Progress Note-Follow Up  Subjective  Chief Complaint  Chief Complaint  Patient presents with  . Follow-up    HPI  Patient is a 49 y.o. female in today for routine medical care. Patient in for follow up doing somewhat better. She is not taking the Metoprolol but notes a significant decrease in palpitations since dropping her coffee intake down from 6 cups to 2 cups. Denies CP/SOB/HA/congestion/fevers/GI or GU c/o. Taking meds as prescribed  Past Medical History  Diagnosis Date  . Allergy     allergic rhinitis  . Hypertension   . Diabetes mellitus     Type II  . Obesity   . Fungus infection 10/13    Left great toe  . Ingrown nail 10/13    right foot next to the last toe  . Insomnia 04/20/2013  . Anemia 07/25/2013  . Dyslipidemia 07/25/2013  . Costochondritis 02/20/2015  . Pedal edema 02/20/2015    Past Surgical History  Procedure Laterality Date  . Lasik      eye surgery    Family History  Problem Relation Age of Onset  . Heart attack Mother 42  . Cancer Father     prostate  . Heart attack Sister 63  . Hypertension Other     History   Social History  . Marital Status: Single    Spouse Name: N/A  . Number of Children: N/A  . Years of Education: N/A   Occupational History  . Not on file.   Social History Main Topics  . Smoking status: Former Research scientist (life sciences)  . Smokeless tobacco: Never Used  . Alcohol Use: Yes     Comment: rare  . Drug Use: Yes    Special: Methylphenidate  . Sexual Activity: Not on file   Other Topics Concern  . Not on file   Social History Narrative    Current Outpatient Prescriptions on File Prior to Visit  Medication Sig Dispense Refill  . amLODipine (NORVASC) 5 MG tablet Take 1 tablet (5 mg total) by mouth daily. 30 tablet 6  . EPIPEN 2-PAK 0.3 MG/0.3ML SOAJ injection     . fexofenadine (ALLEGRA) 180 MG tablet Take 1 tablet (180 mg total) by mouth daily as needed for  allergies or rhinitis. 30 tablet 5  . pantoprazole (PROTONIX) 40 MG tablet Take 1 tablet (40 mg total) by mouth daily. Failed in past, Prevacid, Prilosec, Pepcid, Zantac and Mylanta 30 tablet 11  . metoprolol succinate (TOPROL-XL) 25 MG 24 hr tablet Take 1 tablet (25 mg total) by mouth daily. (Patient not taking: Reported on 03/10/2015) 30 tablet 3   No current facility-administered medications on file prior to visit.    Allergies  Allergen Reactions  . Penicillins     REACTION: lip tingling    Review of Systems  Review of Systems  Constitutional: Negative for fever and malaise/fatigue.  HENT: Negative for congestion.   Eyes: Negative for discharge.  Respiratory: Negative for shortness of breath.   Cardiovascular: Positive for palpitations. Negative for chest pain and leg swelling.  Gastrointestinal: Negative for nausea, abdominal pain and diarrhea.  Genitourinary: Negative for dysuria.  Musculoskeletal: Negative for falls.  Skin: Negative for rash.  Neurological: Negative for loss of consciousness and headaches.  Endo/Heme/Allergies: Negative for polydipsia.  Psychiatric/Behavioral: Negative for depression and suicidal ideas. The patient is not nervous/anxious and does not have insomnia.     Objective  BP 132/82 mmHg  Pulse  93  Temp(Src) 99 F (37.2 C) (Oral)  Ht 5\' 10"  (1.778 m)  Wt 313 lb 8 oz (142.203 kg)  BMI 44.98 kg/m2  SpO2 97%  LMP 02/24/2015  Physical Exam  Physical Exam  Constitutional: She is oriented to person, place, and time and well-developed, well-nourished, and in no distress. No distress.  HENT:  Head: Normocephalic and atraumatic.  Eyes: Conjunctivae are normal.  Neck: Neck supple. No thyromegaly present.  Cardiovascular: Normal rate, regular rhythm and normal heart sounds.   No murmur heard. Pulmonary/Chest: Effort normal and breath sounds normal. She has no wheezes.  Abdominal: She exhibits no distension and no mass.  Musculoskeletal: She  exhibits no edema.  Lymphadenopathy:    She has no cervical adenopathy.  Neurological: She is alert and oriented to person, place, and time.  Skin: Skin is warm and dry. No rash noted. She is not diaphoretic.  Psychiatric: Memory, affect and judgment normal.    Lab Results  Component Value Date   TSH 2.93 03/03/2015   Lab Results  Component Value Date   WBC 7.1 03/03/2015   HGB 12.4 03/03/2015   HCT 37.2 03/03/2015   MCV 83.7 03/03/2015   PLT 301.0 03/03/2015   Lab Results  Component Value Date   CREATININE 0.68 03/10/2015   BUN 9 03/10/2015   NA 139 03/10/2015   K 3.5 03/10/2015   CL 103 03/10/2015   CO2 29 03/10/2015   Lab Results  Component Value Date   ALT 18 03/10/2015   AST 14 03/10/2015   ALKPHOS 73 03/10/2015   BILITOT 0.3 03/10/2015   Lab Results  Component Value Date   CHOL 145 03/03/2015   Lab Results  Component Value Date   HDL 34.90* 03/03/2015   Lab Results  Component Value Date   LDLCALC 90 03/03/2015   Lab Results  Component Value Date   TRIG 101.0 03/03/2015   Lab Results  Component Value Date   CHOLHDL 4 03/03/2015     Assessment & Plan  Essential hypertension Well controlled, no changes to meds. Encouraged heart healthy diet such as the DASH diet and exercise as tolerated. Mild tachycardia noted. Is not taking Metoprolol encouraged to take  Obesity Encouraged DASH diet, decrease po intake and increase exercise as tolerated. Needs 7-8 hours of sleep nightly. Avoid trans fats, eat small, frequent meals every 4-5 hours with lean proteins, complex carbs and healthy fats. Minimize simple carbs. Mild weight loss noted.  PREMATURE VENTRICULAR CONTRACTIONS Improved some with decreased caffeine intake

## 2015-03-20 NOTE — Assessment & Plan Note (Addendum)
Well controlled, no changes to meds. Encouraged heart healthy diet such as the DASH diet and exercise as tolerated. Mild tachycardia noted. Is not taking Metoprolol encouraged to take

## 2015-03-20 NOTE — Assessment & Plan Note (Signed)
Improved some with decreased caffeine intake

## 2015-03-21 ENCOUNTER — Other Ambulatory Visit: Payer: Self-pay

## 2015-03-21 DIAGNOSIS — Z1231 Encounter for screening mammogram for malignant neoplasm of breast: Secondary | ICD-10-CM

## 2015-04-04 LAB — HM PAP SMEAR: HM Pap smear: NORMAL

## 2015-04-14 LAB — HM DIABETES EYE EXAM

## 2015-04-21 ENCOUNTER — Encounter: Payer: Self-pay | Admitting: Family Medicine

## 2015-06-01 ENCOUNTER — Ambulatory Visit
Admission: RE | Admit: 2015-06-01 | Discharge: 2015-06-01 | Disposition: A | Discharge status: Home / Self Care | Payer: BC Managed Care – PPO | Source: Ambulatory Visit

## 2015-06-01 ENCOUNTER — Ambulatory Visit: Payer: BC Managed Care – PPO

## 2015-06-01 DIAGNOSIS — Z1231 Encounter for screening mammogram for malignant neoplasm of breast: Secondary | ICD-10-CM

## 2015-06-21 ENCOUNTER — Encounter: Payer: Self-pay | Admitting: Allergy and Immunology

## 2015-06-21 ENCOUNTER — Ambulatory Visit (INDEPENDENT_AMBULATORY_CARE_PROVIDER_SITE_OTHER): Payer: BC Managed Care – PPO | Admitting: Allergy and Immunology

## 2015-06-21 VITALS — BP 124/90 | HR 84 | Resp 16

## 2015-06-21 DIAGNOSIS — K219 Gastro-esophageal reflux disease without esophagitis: Secondary | ICD-10-CM

## 2015-06-21 DIAGNOSIS — J309 Allergic rhinitis, unspecified: Secondary | ICD-10-CM

## 2015-06-21 NOTE — Patient Instructions (Signed)
  1. Continue immunotherapy and Epi-Pen  2, Continue Flonase 1-2 sprays each nostril one time per day  3. Continue Allerga if needed  4 Continue Pantoprazole 40 one time per day  5. Get flu vaccine  7. Return in 12 months

## 2015-06-22 NOTE — Progress Notes (Signed)
Brandi Lester  Follow-up Note    Subjective:   Brandi Lester is a 49 y.o. female who returns to the [facility] in re-evaluation of the following:  HPI Comments: Brandi Lester returns to this clinic after last being seen in this clinic in August 2015. She is done very well with her immunotherapy. She does continue to use Flonase daily as well as some Allegra and has had very good control with this plan regarding her upper airway and eye issues. She did have an episode of coughing in September with some nasal congestion that lasted less than 2 weeks. This appears to occur about twice a year. Reflexes been okay while using pantoprazole 40 mg daily. She has not had any adverse effect with her immunotherapy. She does have an EpiPen.    Current outpatient prescriptions:  .  amLODipine (NORVASC) 5 MG tablet, Take 1 tablet (5 mg total) by mouth daily., Disp: 30 tablet, Rfl: 6 .  EPIPEN 2-PAK 0.3 MG/0.3ML SOAJ injection, , Disp: , Rfl:  .  fexofenadine (ALLEGRA) 180 MG tablet, Take 1 tablet (180 mg total) by mouth daily as needed for allergies or rhinitis., Disp: 30 tablet, Rfl: 5 .  fluticasone (FLONASE) 50 MCG/ACT nasal spray, Place 2 sprays into both nostrils daily., Disp: 16 g, Rfl: 11 .  pantoprazole (PROTONIX) 40 MG tablet, Take 1 tablet (40 mg total) by mouth daily. Failed in past, Prevacid, Prilosec, Pepcid, Zantac and Mylanta, Disp: 30 tablet, Rfl: 11 .  potassium chloride SA (K-DUR,KLOR-CON) 20 MEQ tablet, TAKE 2 TABLETS (40 MEQ) BY MOUTH DAILY., Disp: 60 tablet, Rfl: 6 .  valsartan-hydrochlorothiazide (DIOVAN-HCT) 160-12.5 MG per tablet, Take 1 tablet by mouth daily., Disp: 90 tablet, Rfl: 1 .  metoprolol succinate (TOPROL-XL) 25 MG 24 hr tablet, Take 1 tablet (25 mg total) by mouth daily. (Patient not taking: Reported on 03/10/2015), Disp: 30 tablet, Rfl: 3  No orders of the defined types were placed in this encounter.     Past Medical History  Diagnosis Date  . Allergy     allergic rhinitis  . Hypertension   . Diabetes mellitus     Type II  . Obesity   . Fungus infection 10/13    Left great toe  . Ingrown nail 10/13    right foot next to the last toe  . Insomnia 04/20/2013  . Anemia 07/25/2013  . Dyslipidemia 07/25/2013  . Costochondritis 02/20/2015  . Pedal edema 02/20/2015    Past Surgical History  Procedure Laterality Date  . Lasik      eye surgery    Allergies  Allergen Reactions  . Penicillins     REACTION: lip tingling    Review of Systems  Constitutional: Negative for fever, chills and weight loss.  HENT: Negative for congestion, ear pain, nosebleeds and sore throat.   Eyes: Negative for pain, discharge and redness.  Respiratory: Negative for cough, shortness of breath and wheezing.   Cardiovascular: Negative for chest pain.  Gastrointestinal: Negative for heartburn, nausea and vomiting.  Skin: Negative for itching and rash.  Neurological: Negative for headaches.     Objective:   Filed Vitals:   06/21/15 1801  BP: 124/90  Pulse: 84  Resp: 16    Physical Exam  Constitutional: She is well-developed, well-nourished, and in no distress. No distress.  HENT:  Head: Normocephalic and atraumatic. Head is without right periorbital erythema and without left periorbital erythema.  Right Ear:  Tympanic membrane, external ear and ear canal normal. No drainage. No foreign bodies. Tympanic membrane is not injected, not scarred, not perforated, not erythematous, not retracted and not bulging. No middle ear effusion.  Left Ear: Tympanic membrane, external ear and ear canal normal. No drainage. No foreign bodies. Tympanic membrane is not injected, not scarred, not perforated, not erythematous, not retracted and not bulging.  No middle ear effusion.  Nose: Nose normal. No mucosal edema, rhinorrhea, nose lacerations, sinus tenderness, nasal deformity, septal deviation or nasal septal  hematoma. No epistaxis.  Mouth/Throat: Oropharynx is clear and moist and mucous membranes are normal. No oropharyngeal exudate, posterior oropharyngeal edema, posterior oropharyngeal erythema or tonsillar abscesses.  Eyes: Conjunctivae and lids are normal. Pupils are equal, round, and reactive to light. Right eye exhibits no discharge and no exudate. No foreign body present in the right eye. Left eye exhibits no discharge and no exudate. No foreign body present in the left eye. Right conjunctiva is not injected. Right conjunctiva has no hemorrhage. Left conjunctiva is not injected. Left conjunctiva has no hemorrhage. No scleral icterus.  Neck: No tracheal tenderness present. No tracheal deviation present. No thyromegaly present.  Cardiovascular: Normal rate, regular rhythm, S1 normal, S2 normal and normal heart sounds.  Exam reveals no gallop and no friction rub.   No murmur heard. Pulmonary/Chest: Effort normal. No respiratory distress. She has no wheezes. She has no rhonchi. She has no rales. She exhibits no tenderness.  Musculoskeletal: She exhibits no edema or tenderness.  Lymphadenopathy:    She has no cervical adenopathy.  Skin: No purpura and no rash noted. Rash is not macular, not maculopapular, not nodular, not pustular, not vesicular and not urticarial. She is not diaphoretic. No cyanosis or erythema. No pallor. Nails show no clubbing.  Psychiatric: Mood, affect and judgment normal.    Diagnostics: none  Assessment and Plan:   1. Allergic rhinitis, unspecified allergic rhinitis type   2. Gastroesophageal reflux disease, esophagitis presence not specified       1. Continue immunotherapy and Epi-Pen  2, Continue Flonase 1-2 sprays each nostril one time per day  3. Continue Allerga if needed  4 Continue Pantoprazole 40 one time per day  5. Get flu vaccine  7. Return in 12 months  Overall [name] has done very well regarding her allergic rhinoconjunctivitis while using a  course of immunotherapy and we will continue to have her use this form of therapy at this point in time. She will also continue on Flonase and Allegra. Her reflux is under good control. I will see her back in this clinic in 1 year or earlier if there is a problem.   Brandi Katz, MD Brandi Lester

## 2015-06-29 ENCOUNTER — Ambulatory Visit (INDEPENDENT_AMBULATORY_CARE_PROVIDER_SITE_OTHER): Payer: BC Managed Care – PPO | Admitting: Behavioral Health

## 2015-06-29 DIAGNOSIS — Z23 Encounter for immunization: Secondary | ICD-10-CM

## 2015-06-29 NOTE — Progress Notes (Signed)
Pre visit review using our clinic review tool, if applicable. No additional management support is needed unless otherwise documented below in the visit note. 

## 2015-07-27 ENCOUNTER — Ambulatory Visit (INDEPENDENT_AMBULATORY_CARE_PROVIDER_SITE_OTHER): Payer: BC Managed Care – PPO | Admitting: *Deleted

## 2015-07-27 DIAGNOSIS — J309 Allergic rhinitis, unspecified: Secondary | ICD-10-CM

## 2015-09-01 ENCOUNTER — Ambulatory Visit (INDEPENDENT_AMBULATORY_CARE_PROVIDER_SITE_OTHER): Payer: BC Managed Care – PPO

## 2015-09-01 DIAGNOSIS — J309 Allergic rhinitis, unspecified: Secondary | ICD-10-CM | POA: Diagnosis not present

## 2015-09-14 ENCOUNTER — Telehealth: Payer: Self-pay | Admitting: Behavioral Health

## 2015-09-14 ENCOUNTER — Encounter: Payer: Self-pay | Admitting: Behavioral Health

## 2015-09-14 NOTE — Telephone Encounter (Signed)
Pre-Visit Call completed with patient and chart updated.   Pre-Visit Info documented in Specialty Comments under [employer].    

## 2015-09-15 ENCOUNTER — Encounter: Payer: Self-pay | Admitting: Family Medicine

## 2015-09-15 ENCOUNTER — Ambulatory Visit (INDEPENDENT_AMBULATORY_CARE_PROVIDER_SITE_OTHER): Payer: BC Managed Care – PPO | Admitting: Family Medicine

## 2015-09-15 VITALS — BP 134/78 | HR 107 | Temp 98.6°F | Ht 70.0 in | Wt 317.0 lb

## 2015-09-15 DIAGNOSIS — G4700 Insomnia, unspecified: Secondary | ICD-10-CM

## 2015-09-15 DIAGNOSIS — E1169 Type 2 diabetes mellitus with other specified complication: Secondary | ICD-10-CM

## 2015-09-15 DIAGNOSIS — Z0000 Encounter for general adult medical examination without abnormal findings: Secondary | ICD-10-CM | POA: Diagnosis not present

## 2015-09-15 DIAGNOSIS — Z23 Encounter for immunization: Secondary | ICD-10-CM

## 2015-09-15 DIAGNOSIS — T7840XA Allergy, unspecified, initial encounter: Secondary | ICD-10-CM | POA: Diagnosis not present

## 2015-09-15 DIAGNOSIS — E119 Type 2 diabetes mellitus without complications: Secondary | ICD-10-CM | POA: Diagnosis not present

## 2015-09-15 DIAGNOSIS — K219 Gastro-esophageal reflux disease without esophagitis: Secondary | ICD-10-CM | POA: Diagnosis not present

## 2015-09-15 DIAGNOSIS — E876 Hypokalemia: Secondary | ICD-10-CM

## 2015-09-15 DIAGNOSIS — J019 Acute sinusitis, unspecified: Secondary | ICD-10-CM

## 2015-09-15 DIAGNOSIS — E669 Obesity, unspecified: Secondary | ICD-10-CM | POA: Diagnosis not present

## 2015-09-15 DIAGNOSIS — I10 Essential (primary) hypertension: Secondary | ICD-10-CM | POA: Diagnosis not present

## 2015-09-15 MED ORDER — AMLODIPINE BESYLATE 5 MG PO TABS: 5.0000 mg | ORAL_TABLET | Freq: Every day | ORAL | Status: DC

## 2015-09-15 MED ORDER — FLUTICASONE PROPIONATE 50 MCG/ACT NA SUSP: 2.0000 | Freq: Every day | NASAL | Status: DC

## 2015-09-15 MED ORDER — VALSARTAN-HYDROCHLOROTHIAZIDE 160-12.5 MG PO TABS: 1.0000 | ORAL_TABLET | Freq: Every day | ORAL | Status: DC

## 2015-09-15 MED ORDER — FEXOFENADINE HCL 180 MG PO TABS: 180.0000 mg | ORAL_TABLET | Freq: Every day | ORAL | Status: DC

## 2015-09-15 MED ORDER — DOXYCYCLINE HYCLATE 100 MG PO TABS: 100.0000 mg | ORAL_TABLET | Freq: Two times a day (BID) | ORAL | Status: AC

## 2015-09-15 MED ORDER — POTASSIUM CHLORIDE CRYS ER 20 MEQ PO TBCR: EXTENDED_RELEASE_TABLET | ORAL | Status: DC

## 2015-09-15 MED ORDER — MONTELUKAST SODIUM 10 MG PO TABS: 10.0000 mg | ORAL_TABLET | Freq: Every day | ORAL | Status: DC

## 2015-09-15 MED ORDER — PANTOPRAZOLE SODIUM 40 MG PO TBEC: 40.0000 mg | DELAYED_RELEASE_TABLET | Freq: Every day | ORAL | Status: DC

## 2015-09-15 MED FILL — FLUTICASONE PROP 50 MCG SPR: 50 | 30 days supply | Qty: 16 | Fill #0

## 2015-09-15 MED FILL — DOXYCYCLINE 100 MG TABLET: 100 | 10 days supply | Qty: 20 | Fill #0

## 2015-09-15 MED FILL — SM FEXOFENADINE HCL 180 MG: 180 MG | 30 days supply | Qty: 30 | Fill #0

## 2015-09-15 MED FILL — MONTELUKAST SOD 10 MG TAB: 10 | 30 days supply | Qty: 30 | Fill #0

## 2015-09-15 MED FILL — POTASSIUM CL ER 20 MEQ TABL: 20 | 30 days supply | Qty: 60 | Fill #6

## 2015-09-15 MED FILL — AMLODIPINE BESYLATE 5 MG TA: 5 | 30 days supply | Qty: 30 | Fill #6

## 2015-09-15 NOTE — Assessment & Plan Note (Signed)
Encouraged heart healthy diet, increase exercise, avoid trans fats, consider a krill oil cap daily 

## 2015-09-15 NOTE — Assessment & Plan Note (Signed)
Encouraged good sleep hygiene such as dark, quiet room. No blue/green glowing lights such as computer screens in bedroom. No alcohol or stimulants in evening. Cut down on caffeine as able. Regular exercise is helpful but not just prior to bed time. Can try Melatonin and L Typrtophan prn

## 2015-09-15 NOTE — Patient Instructions (Addendum)
Try Elderberry, Aged garlic, Probiotic, Melatonin, and Tryptophan [email or web address] Preventive Care for Adults, Female A healthy lifestyle and preventive care can promote health and wellness. Preventive health guidelines for women include the following key practices.  A routine yearly physical is a good way to check with your health care provider about your health and preventive screening. It is a chance to share any concerns and updates on your health and to receive a thorough exam.  Visit your dentist for a routine exam and preventive care every 6 months. Brush your teeth twice a day and floss once a day. Good oral hygiene prevents tooth decay and gum disease.  The frequency of eye exams is based on your age, health, family medical history, use of contact lenses, and other factors. Follow your health care provider's recommendations for frequency of eye exams.  Eat a healthy diet. Foods like vegetables, fruits, whole grains, low-fat dairy products, and lean protein foods contain the nutrients you need without too many calories. Decrease your intake of foods high in solid fats, added sugars, and salt. Eat the right amount of calories for you.Get information about a proper diet from your health care provider, if necessary.  Regular physical exercise is one of the most important things you can do for your health. Most adults should get at least 150 minutes of moderate-intensity exercise (any activity that increases your heart rate and causes you to sweat) each week. In addition, most adults need muscle-strengthening exercises on 2 or more days a week.  Maintain a healthy weight. The body mass index (BMI) is a screening tool to identify possible weight problems. It provides an estimate of body fat based on height and weight. Your health care provider can find your BMI and can help you achieve or maintain a healthy weight.For adults 20 years and older:  A BMI below 18.5 is considered underweight.  A  BMI of 18.5 to 24.9 is normal.  A BMI of 25 to 29.9 is considered overweight.  A BMI of 30 and above is considered obese.  Maintain normal blood lipids and cholesterol levels by exercising and minimizing your intake of saturated fat. Eat a balanced diet with plenty of fruit and vegetables. Blood tests for lipids and cholesterol should begin at age 8 and be repeated every 5 years. If your lipid or cholesterol levels are high, you are over 50, or you are at high risk for heart disease, you may need your cholesterol levels checked more frequently.Ongoing high lipid and cholesterol levels should be treated with medicines if diet and exercise are not working.  If you smoke, find out from your health care provider how to quit. If you do not use tobacco, do not start.  Lung cancer screening is recommended for adults aged 55-80 years who are at high risk for developing lung cancer because of a history of smoking. A yearly low-dose CT scan of the lungs is recommended for people who have at least a 30-pack-year history of smoking and are a current smoker or have quit within the past 15 years. A pack year of smoking is smoking an average of 1 pack of cigarettes a day for 1 year (for example: 1 pack a day for 30 years or 2 packs a day for 15 years). Yearly screening should continue until the smoker has stopped smoking for at least 15 years. Yearly screening should be stopped for people who develop a health problem that would prevent them from having lung cancer treatment.  If you  are pregnant, do not drink alcohol. If you are breastfeeding, be very cautious about drinking alcohol. If you are not pregnant and choose to drink alcohol, do not have more than 1 drink per day. One drink is considered to be 12 ounces (355 mL) of beer, 5 ounces (148 mL) of wine, or 1.5 ounces (44 mL) of liquor.  Avoid use of street drugs. Do not share needles with anyone. Ask for help if you need support or instructions about stopping  the use of drugs.  High blood pressure causes heart disease and increases the risk of stroke. Your blood pressure should be checked at least every 1 to 2 years. Ongoing high blood pressure should be treated with medicines if weight loss and exercise do not work.  If you are 53-29 years old, ask your health care provider if you should take aspirin to prevent strokes.  Diabetes screening is done by taking a blood sample to check your blood glucose level after you have not eaten for a certain period of time (fasting). If you are not overweight and you do not have risk factors for diabetes, you should be screened once every 3 years starting at age 43. If you are overweight or obese and you are 49-33 years of age, you should be screened for diabetes every year as part of your cardiovascular risk assessment.  Breast cancer screening is essential preventive care for women. You should practice "breast self-awareness." This means understanding the normal appearance and feel of your breasts and may include breast self-examination. Any changes detected, no matter how small, should be reported to a health care provider. Women in their 60s and 30s should have a clinical breast exam (CBE) by a health care provider as part of a regular health exam every 1 to 3 years. After age 53, women should have a CBE every year. Starting at age 55, women should consider having a mammogram (breast X-ray test) every year. Women who have a family history of breast cancer should talk to their health care provider about genetic screening. Women at a high risk of breast cancer should talk to their health care providers about having an MRI and a mammogram every year.  Breast cancer gene (BRCA)-related cancer risk assessment is recommended for women who have family members with BRCA-related cancers. BRCA-related cancers include breast, ovarian, tubal, and peritoneal cancers. Having family members with these cancers may be associated with an  increased risk for harmful changes (mutations) in the breast cancer genes BRCA1 and BRCA2. Results of the assessment will determine the need for genetic counseling and BRCA1 and BRCA2 testing.  Your health care provider may recommend that you be screened regularly for cancer of the pelvic organs (ovaries, uterus, and vagina). This screening involves a pelvic examination, including checking for microscopic changes to the surface of your cervix (Pap test). You may be encouraged to have this screening done every 3 years, beginning at age 73.  For women ages 49-65, health care providers may recommend pelvic exams and Pap testing every 3 years, or they may recommend the Pap and pelvic exam, combined with testing for human papilloma virus (HPV), every 5 years. Some types of HPV increase your risk of cervical cancer. Testing for HPV may also be done on women of any age with unclear Pap test results.  Other health care providers may not recommend any screening for nonpregnant women who are considered low risk for pelvic cancer and who do not have symptoms. Ask your health care  provider if a screening pelvic exam is right for you.  If you have had past treatment for cervical cancer or a condition that could lead to cancer, you need Pap tests and screening for cancer for at least 20 years after your treatment. If Pap tests have been discontinued, your risk factors (such as having a new sexual partner) need to be reassessed to determine if screening should resume. Some women have medical problems that increase the chance of getting cervical cancer. In these cases, your health care provider may recommend more frequent screening and Pap tests.  Colorectal cancer can be detected and often prevented. Most routine colorectal cancer screening begins at the age of 50 years and continues through age 48 years. However, your health care provider may recommend screening at an earlier age if you have risk factors for colon  cancer. On a yearly basis, your health care provider may provide home test kits to check for hidden blood in the stool. Use of a small camera at the end of a tube, to directly examine the colon (sigmoidoscopy or colonoscopy), can detect the earliest forms of colorectal cancer. Talk to your health care provider about this at age 3, when routine screening begins. Direct exam of the colon should be repeated every 5-10 years through age 59 years, unless early forms of precancerous polyps or small growths are found.  People who are at an increased risk for hepatitis B should be screened for this virus. You are considered at high risk for hepatitis B if:  You were born in a country where hepatitis B occurs often. Talk with your health care provider about which countries are considered high risk.  Your parents were born in a high-risk country and you have not received a shot to protect against hepatitis B (hepatitis B vaccine).  You have HIV or AIDS.  You use needles to inject street drugs.  You live with, or have sex with, someone who has hepatitis B.  You get hemodialysis treatment.  You take certain medicines for conditions like cancer, organ transplantation, and autoimmune conditions.  Hepatitis C blood testing is recommended for all people born from [birth year] through 1965 and any individual with known risks for hepatitis C.  Practice safe sex. Use condoms and avoid high-risk sexual practices to reduce the spread of sexually transmitted infections (STIs). STIs include gonorrhea, chlamydia, syphilis, trichomonas, herpes, HPV, and human immunodeficiency virus (HIV). Herpes, HIV, and HPV are viral illnesses that have no cure. They can result in disability, cancer, and death.  You should be screened for sexually transmitted illnesses (STIs) including gonorrhea and chlamydia if:  You are sexually active and are younger than 24 years.  You are older than 24 years and your health care provider tells you  that you are at risk for this type of infection.  Your sexual activity has changed since you were last screened and you are at an increased risk for chlamydia or gonorrhea. Ask your health care provider if you are at risk.  If you are at risk of being infected with HIV, it is recommended that you take a prescription medicine daily to prevent HIV infection. This is called preexposure prophylaxis (PrEP). You are considered at risk if:  You are sexually active and do not regularly use condoms or know the HIV status of your partner(s).  You take drugs by injection.  You are sexually active with a partner who has HIV.  Talk with your health care provider about whether you are  at high risk of being infected with HIV. If you choose to begin PrEP, you should first be tested for HIV. You should then be tested every 3 months for as long as you are taking PrEP.  Osteoporosis is a disease in which the bones lose minerals and strength with aging. This can result in serious bone fractures or breaks. The risk of osteoporosis can be identified using a bone density scan. Women ages 28 years and over and women at risk for fractures or osteoporosis should discuss screening with their health care providers. Ask your health care provider whether you should take a calcium supplement or vitamin D to reduce the rate of osteoporosis.  Menopause can be associated with physical symptoms and risks. Hormone replacement therapy is available to decrease symptoms and risks. You should talk to your health care provider about whether hormone replacement therapy is right for you.  Use sunscreen. Apply sunscreen liberally and repeatedly throughout the day. You should seek shade when your shadow is shorter than you. Protect yourself by wearing long sleeves, pants, a wide-brimmed hat, and sunglasses year round, whenever you are outdoors.  Once a month, do a whole body skin exam, using a mirror to look at the skin on your back. Tell  your health care provider of new moles, moles that have irregular borders, moles that are larger than a pencil eraser, or moles that have changed in shape or color.  Stay current with required vaccines (immunizations).  Influenza vaccine. All adults should be immunized every year.  Tetanus, diphtheria, and acellular pertussis (Td, Tdap) vaccine. Pregnant women should receive 1 dose of Tdap vaccine during each pregnancy. The dose should be obtained regardless of the length of time since the last dose. Immunization is preferred during the 27th-36th week of gestation. An adult who has not previously received Tdap or who does not know her vaccine status should receive 1 dose of Tdap. This initial dose should be followed by tetanus and diphtheria toxoids (Td) booster doses every 10 years. Adults with an unknown or incomplete history of completing a 3-dose immunization series with Td-containing vaccines should begin or complete a primary immunization series including a Tdap dose. Adults should receive a Td booster every 10 years.  Varicella vaccine. An adult without evidence of immunity to varicella should receive 2 doses or a second dose if she has previously received 1 dose. Pregnant females who do not have evidence of immunity should receive the first dose after pregnancy. This first dose should be obtained before leaving the health care facility. The second dose should be obtained 4-8 weeks after the first dose.  Human papillomavirus (HPV) vaccine. Females aged 13-26 years who have not received the vaccine previously should obtain the 3-dose series. The vaccine is not recommended for use in pregnant females. However, pregnancy testing is not needed before receiving a dose. If a female is found to be pregnant after receiving a dose, no treatment is needed. In that case, the remaining doses should be delayed until after the pregnancy. Immunization is recommended for any person with an immunocompromised  condition through the age of 22 years if she did not get any or all doses earlier. During the 3-dose series, the second dose should be obtained 4-8 weeks after the first dose. The third dose should be obtained 24 weeks after the first dose and 16 weeks after the second dose.  Zoster vaccine. One dose is recommended for adults aged 21 years or older unless certain conditions are present.  Measles, mumps, and rubella (MMR) vaccine. Adults born before [birth year] generally are considered immune to measles and mumps. Adults born in [birth year] or later should have 1 or more doses of MMR vaccine unless there is a contraindication to the vaccine or there is laboratory evidence of immunity to each of the three diseases. A routine second dose of MMR vaccine should be obtained at least 28 days after the first dose for students attending postsecondary schools, health care workers, or international travelers. People who received inactivated measles vaccine or an unknown type of measles vaccine during 1963-1967 should receive 2 doses of MMR vaccine. People who received inactivated mumps vaccine or an unknown type of mumps vaccine before 1979 and are at high risk for mumps infection should consider immunization with 2 doses of MMR vaccine. For females of childbearing age, rubella immunity should be determined. If there is no evidence of immunity, females who are not pregnant should be vaccinated. If there is no evidence of immunity, females who are pregnant should delay immunization until after pregnancy. Unvaccinated health care workers born before [birth year] who lack laboratory evidence of measles, mumps, or rubella immunity or laboratory confirmation of disease should consider measles and mumps immunization with 2 doses of MMR vaccine or rubella immunization with 1 dose of MMR vaccine.  Pneumococcal 13-valent conjugate (PCV13) vaccine. When indicated, a person who is uncertain of his immunization history and has no record of immunization  should receive the PCV13 vaccine. All adults 33 years of age and older should receive this vaccine. An adult aged 34 years or older who has certain medical conditions and has not been previously immunized should receive 1 dose of PCV13 vaccine. This PCV13 should be followed with a dose of pneumococcal polysaccharide (PPSV23) vaccine. Adults who are at high risk for pneumococcal disease should obtain the PPSV23 vaccine at least 8 weeks after the dose of PCV13 vaccine. Adults older than 48 years of age who have normal immune system function should obtain the PPSV23 vaccine dose at least 1 year after the dose of PCV13 vaccine.  Pneumococcal polysaccharide (PPSV23) vaccine. When PCV13 is also indicated, PCV13 should be obtained first. All adults aged 43 years and older should be immunized. An adult younger than age 43 years who has certain medical conditions should be immunized. Any person who resides in a nursing home or long-term care facility should be immunized. An adult smoker should be immunized. People with an immunocompromised condition and certain other conditions should receive both PCV13 and PPSV23 vaccines. People with human immunodeficiency virus (HIV) infection should be immunized as soon as possible after diagnosis. Immunization during chemotherapy or radiation therapy should be avoided. Routine use of PPSV23 vaccine is not recommended for American Indians, [address], or people younger than 65 years unless there are medical conditions that require PPSV23 vaccine. When indicated, people who have unknown immunization and have no record of immunization should receive PPSV23 vaccine. One-time revaccination 5 years after the first dose of PPSV23 is recommended for people aged 19-64 years who have chronic kidney failure, nephrotic syndrome, asplenia, or immunocompromised conditions. People who received 1-2 doses of PPSV23 before age 37 years should receive another dose of PPSV23 vaccine at age 68 years  or later if at least 5 years have passed since the previous dose. Doses of PPSV23 are not needed for people immunized with PPSV23 at or after age 64 years.  Meningococcal vaccine. Adults with asplenia or persistent complement component deficiencies should receive 2 doses of quadrivalent meningococcal  conjugate (MenACWY-D) vaccine. The doses should be obtained at least 2 months apart. Microbiologists working with certain meningococcal bacteria, [employer] recruits, people at risk during an outbreak, and people who travel to or live in countries with a high rate of meningitis should be immunized. A first-year college student up through age 10 years who is living in a residence hall should receive a dose if she did not receive a dose on or after her 16th birthday. Adults who have certain high-risk conditions should receive one or more doses of vaccine.  Hepatitis A vaccine. Adults who wish to be protected from this disease, have certain high-risk conditions, work with hepatitis A-infected animals, work in hepatitis A research labs, or travel to or work in countries with a high rate of hepatitis A should be immunized. Adults who were previously unvaccinated and who anticipate close contact with an international adoptee during the first 60 days after arrival in the [place] from a country with a high rate of hepatitis A should be immunized.  Hepatitis B vaccine. Adults who wish to be protected from this disease, have certain high-risk conditions, may be exposed to blood or other infectious body fluids, are household contacts or sex partners of hepatitis B positive people, are clients or workers in certain care facilities, or travel to or work in countries with a high rate of hepatitis B should be immunized.  Haemophilus influenzae type b (Hib) vaccine. A previously unvaccinated person with asplenia or sickle cell disease or having a scheduled splenectomy should receive 1 dose of Hib vaccine. Regardless of  previous immunization, a recipient of a hematopoietic stem cell transplant should receive a 3-dose series 6-12 months after her successful transplant. Hib vaccine is not recommended for adults with HIV infection. Preventive Services / Frequency Ages 63 to 60 years  Blood pressure check.** / Every 3-5 years.  Lipid and cholesterol check.** / Every 5 years beginning at age 27.  Clinical breast exam.** / Every 3 years for women in their 78s and 64s.  BRCA-related cancer risk assessment.** / For women who have family members with a BRCA-related cancer (breast, ovarian, tubal, or peritoneal cancers).  Pap test.** / Every 2 years from ages 43 through 67. Every 3 years starting at age 93 through age 15 or 27 with a history of 3 consecutive normal Pap tests.  HPV screening.** / Every 3 years from ages 96 through ages 94 to 4 with a history of 3 consecutive normal Pap tests.  Hepatitis C blood test.** / For any individual with known risks for hepatitis C.  Skin self-exam. / Monthly.  Influenza vaccine. / Every year.  Tetanus, diphtheria, and acellular pertussis (Tdap, Td) vaccine.** / Consult your health care provider. Pregnant women should receive 1 dose of Tdap vaccine during each pregnancy. 1 dose of Td every 10 years.  Varicella vaccine.** / Consult your health care provider. Pregnant females who do not have evidence of immunity should receive the first dose after pregnancy.  HPV vaccine. / 3 doses over 6 months, if 70 and younger. The vaccine is not recommended for use in pregnant females. However, pregnancy testing is not needed before receiving a dose.  Measles, mumps, rubella (MMR) vaccine.** / You need at least 1 dose of MMR if you were born in 1957 or later. You may also need a 2nd dose. For females of childbearing age, rubella immunity should be determined. If there is no evidence of immunity, females who are not pregnant should be vaccinated. If there  is no evidence of immunity,  females who are pregnant should delay immunization until after pregnancy.  Pneumococcal 13-valent conjugate (PCV13) vaccine.** / Consult your health care provider.  Pneumococcal polysaccharide (PPSV23) vaccine.** / 1 to 2 doses if you smoke cigarettes or if you have certain conditions.  Meningococcal vaccine.** / 1 dose if you are age 57 to 37 years and a Market researcher living in a residence hall, or have one of several medical conditions, you need to get vaccinated against meningococcal disease. You may also need additional booster doses.  Hepatitis A vaccine.** / Consult your health care provider.  Hepatitis B vaccine.** / Consult your health care provider.  Haemophilus influenzae type b (Hib) vaccine.** / Consult your health care provider. Ages 10 to 13 years  Blood pressure check.** / Every year.  Lipid and cholesterol check.** / Every 5 years beginning at age 60 years.  Lung cancer screening. / Every year if you are aged 31-80 years and have a 30-pack-year history of smoking and currently smoke or have quit within the past 15 years. Yearly screening is stopped once you have quit smoking for at least 15 years or develop a health problem that would prevent you from having lung cancer treatment.  Clinical breast exam.** / Every year after age 72 years.  BRCA-related cancer risk assessment.** / For women who have family members with a BRCA-related cancer (breast, ovarian, tubal, or peritoneal cancers).  Mammogram.** / Every year beginning at age 74 years and continuing for as long as you are in good health. Consult with your health care provider.  Pap test.** / Every 3 years starting at age 14 years through age 69 or 99 years with a history of 3 consecutive normal Pap tests.  HPV screening.** / Every 3 years from ages 35 years through ages 63 to 66 years with a history of 3 consecutive normal Pap tests.  Fecal occult blood test (FOBT) of stool. / Every year beginning at  age 75 years and continuing until age 50 years. You may not need to do this test if you get a colonoscopy every 10 years.  Flexible sigmoidoscopy or colonoscopy.** / Every 5 years for a flexible sigmoidoscopy or every 10 years for a colonoscopy beginning at age 38 years and continuing until age 90 years.  Hepatitis C blood test.** / For all people born from [birth year] through 1963 and any individual with known risks for hepatitis C.  Skin self-exam. / Monthly.  Influenza vaccine. / Every year.  Tetanus, diphtheria, and acellular pertussis (Tdap/Td) vaccine.** / Consult your health care provider. Pregnant women should receive 1 dose of Tdap vaccine during each pregnancy. 1 dose of Td every 10 years.  Varicella vaccine.** / Consult your health care provider. Pregnant females who do not have evidence of immunity should receive the first dose after pregnancy.  Zoster vaccine.** / 1 dose for adults aged 63 years or older.  Measles, mumps, rubella (MMR) vaccine.** / You need at least 1 dose of MMR if you were born in 1957 or later. You may also need a second dose. For females of childbearing age, rubella immunity should be determined. If there is no evidence of immunity, females who are not pregnant should be vaccinated. If there is no evidence of immunity, females who are pregnant should delay immunization until after pregnancy.  Pneumococcal 13-valent conjugate (PCV13) vaccine.** / Consult your health care provider.  Pneumococcal polysaccharide (PPSV23) vaccine.** / 1 to 2 doses if you smoke cigarettes or if  you have certain conditions.  Meningococcal vaccine.** / Consult your health care provider.  Hepatitis A vaccine.** / Consult your health care provider.  Hepatitis B vaccine.** / Consult your health care provider.  Haemophilus influenzae type b (Hib) vaccine.** / Consult your health care provider. Ages 74 years and over  Blood pressure check.** / Every year.  Lipid and cholesterol check.**  / Every 5 years beginning at age 68 years.  Lung cancer screening. / Every year if you are aged 16-80 years and have a 30-pack-year history of smoking and currently smoke or have quit within the past 15 years. Yearly screening is stopped once you have quit smoking for at least 15 years or develop a health problem that would prevent you from having lung cancer treatment.  Clinical breast exam.** / Every year after age 68 years.  BRCA-related cancer risk assessment.** / For women who have family members with a BRCA-related cancer (breast, ovarian, tubal, or peritoneal cancers).  Mammogram.** / Every year beginning at age 69 years and continuing for as long as you are in good health. Consult with your health care provider.  Pap test.** / Every 3 years starting at age 73 years through age 14 or 39 years with 3 consecutive normal Pap tests. Testing can be stopped between 65 and 70 years with 3 consecutive normal Pap tests and no abnormal Pap or HPV tests in the past 10 years.  HPV screening.** / Every 3 years from ages 8 years through ages 66 or 14 years with a history of 3 consecutive normal Pap tests. Testing can be stopped between 65 and 70 years with 3 consecutive normal Pap tests and no abnormal Pap or HPV tests in the past 10 years.  Fecal occult blood test (FOBT) of stool. / Every year beginning at age 15 years and continuing until age 45 years. You may not need to do this test if you get a colonoscopy every 10 years.  Flexible sigmoidoscopy or colonoscopy.** / Every 5 years for a flexible sigmoidoscopy or every 10 years for a colonoscopy beginning at age 74 years and continuing until age 74 years.  Hepatitis C blood test.** / For all people born from [birth year] through 1963 and any individual with known risks for hepatitis C.  Osteoporosis screening.** / A one-time screening for women ages 46 years and over and women at risk for fractures or osteoporosis.  Skin self-exam. / Monthly.  Influenza  vaccine. / Every year.  Tetanus, diphtheria, and acellular pertussis (Tdap/Td) vaccine.** / 1 dose of Td every 10 years.  Varicella vaccine.** / Consult your health care provider.  Zoster vaccine.** / 1 dose for adults aged 24 years or older.  Pneumococcal 13-valent conjugate (PCV13) vaccine.** / Consult your health care provider.  Pneumococcal polysaccharide (PPSV23) vaccine.** / 1 dose for all adults aged 75 years and older.  Meningococcal vaccine.** / Consult your health care provider.  Hepatitis A vaccine.** / Consult your health care provider.  Hepatitis B vaccine.** / Consult your health care provider.  Haemophilus influenzae type b (Hib) vaccine.** / Consult your health care provider. ** Family history and personal history of risk and conditions may change your health care provider's recommendations.   This information is not intended to replace advice given to you by your health care provider. Make sure you discuss any questions you have with your health care provider.   Document Released: 10/16/2001 Document Revised: 09/10/2014 Document Reviewed: 01/15/2011 Elsevier Interactive Patient Education [employer].

## 2015-09-15 NOTE — Progress Notes (Signed)
Subjective:    Patient ID: Brandi Lester, female    DOB: [birth year], 50 y.o.   MRN: [id number]  Chief Complaint  Patient presents with  . Annual Exam    HPI Patient is in today for annual exam and follow-up on numerous concerns. She has been struggling with allergies and upper respiratory symptoms for about the last month. She has been taking Allegra with only minimal improvement. She is struggling with intermittent headaches and facial pressure. She has some left ear pain and some persistent congestion some clear rhinorrhea. She also notes a cough significant enough to keep her up at times and some chills. She denies fevers or myalgias. She has had occasional loose stool but this is normal for her. No bloody or tarry stool. She has an appointment later this month with her gynecologist Dr. Harrington Challenger for further care secondary to her fibroids. No other recent acute complaint. Denies CP/palp/SOB or GU c/o. Taking meds as prescribed  Past Medical History  Diagnosis Date  . Allergy     allergic rhinitis  . Hypertension   . Obesity   . Fungus infection 10/13    Left great toe  . Ingrown nail 10/13    right foot next to the last toe  . Insomnia 04/20/2013  . Anemia 07/25/2013  . Dyslipidemia 07/25/2013  . Costochondritis 02/20/2015  . Pedal edema 02/20/2015  . Diabetes mellitus     Type II   previously 3 years ago - no longer on meds   . GERD (gastroesophageal reflux disease)   . Preventative health care 09/25/2015    Past Surgical History  Procedure Laterality Date  . Lasik      eye surgery  . Wisdom tooth extraction      Family History  Problem Relation Age of Onset  . Heart attack Mother 90  . Cancer Father     prostate  . Heart attack Sister 62  . Hypertension Other     Social History   Social History  . Marital Status: Single    Spouse Name: N/A  . Number of Children: N/A  . Years of Education: N/A   Occupational History  . Not on file.   Social History Main  Topics  . Smoking status: Former Research scientist (life sciences)  . Smokeless tobacco: Never Used  . Alcohol Use: Yes     Comment: rare  . Drug Use: No  . Sexual Activity: Not on file   Other Topics Concern  . Not on file   Social History Narrative    Outpatient Prescriptions Prior to Visit  Medication Sig Dispense Refill  . EPIPEN 2-PAK 0.3 MG/0.3ML SOAJ injection 0.3 mg once.     [name] amLODipine (NORVASC) 5 MG tablet Take 1 tablet (5 mg total) by mouth daily. 30 tablet 6  . fexofenadine (ALLEGRA) 180 MG tablet Take 1 tablet (180 mg total) by mouth daily as needed for allergies or rhinitis. 30 tablet 5  . fluticasone (FLONASE) 50 MCG/ACT nasal spray Place 2 sprays into both nostrils daily. 16 g 11  . pantoprazole (PROTONIX) 40 MG tablet Take 1 tablet (40 mg total) by mouth daily. Failed in past, Prevacid, Prilosec, Pepcid, Zantac and Mylanta 30 tablet 11  . potassium chloride SA (K-DUR,KLOR-CON) 20 MEQ tablet TAKE 2 TABLETS (40 MEQ) BY MOUTH DAILY. 60 tablet 6  . valsartan-hydrochlorothiazide (DIOVAN-HCT) 160-12.5 MG per tablet Take 1 tablet by mouth daily. 90 tablet 1   No facility-administered medications prior to visit.    Allergies  Allergen Reactions  . Penicillins Itching and Swelling    Has patient had a PCN reaction causing immediate rash, facial/tongue/throat swelling, SOB or lightheadedness with hypotension: yes Has patient had a PCN reaction causing severe rash involving mucus membranes or skin necrosis: no Has patient had a PCN reaction that required hospitalization no Has patient had a PCN reaction occurring within the last 10 years: yes If all of the above answers are "NO", then may proceed with Cephalosporin use.     Review of Systems  Constitutional: Positive for chills. Negative for fever and malaise/fatigue.  HENT: Positive for congestion. Negative for hearing loss.   Eyes: Negative for discharge.  Respiratory: Positive for cough and sputum production. Negative for shortness of  breath.   Cardiovascular: Positive for chest pain. Negative for palpitations and leg swelling.  Gastrointestinal: Negative for heartburn, nausea, vomiting, abdominal pain, diarrhea, constipation and blood in stool.  Genitourinary: Negative for dysuria, urgency, frequency and hematuria.  Musculoskeletal: Negative for myalgias, back pain and falls.  Skin: Negative for rash.  Neurological: Positive for focal weakness. Negative for dizziness, sensory change, loss of consciousness, weakness and headaches.  Endo/Heme/Allergies: Negative for environmental allergies. Does not bruise/bleed easily.  Psychiatric/Behavioral: Negative for depression and suicidal ideas. The patient is not nervous/anxious and does not have insomnia.        Objective:    Physical Exam  Constitutional: She is oriented to person, place, and time. She appears well-developed and well-nourished. No distress.  HENT:  Head: Normocephalic and atraumatic.  TMs dull, mild erythema, nasal mucosa boggy and erythematous  Eyes: Conjunctivae are normal.  Neck: Neck supple. No thyromegaly present.  Cardiovascular: Normal rate, regular rhythm and normal heart sounds.   No murmur heard. Pulmonary/Chest: Effort normal and breath sounds normal. No respiratory distress.  Abdominal: Soft. Bowel sounds are normal. She exhibits no distension and no mass. There is no tenderness.  Musculoskeletal: She exhibits no edema.  Lymphadenopathy:    She has no cervical adenopathy.  Neurological: She is alert and oriented to person, place, and time.  Skin: Skin is warm and dry.  Psychiatric: She has a normal mood and affect. Her behavior is normal.    BP 134/78 mmHg  Pulse 107  Temp(Src) 98.6 F (37 C) (Oral)  Ht 5\' 10"  (1.778 m)  Wt 317 lb (143.79 kg)  BMI 45.48 kg/m2  SpO2 98% Wt Readings from Last 3 Encounters:  09/21/15 316 lb (143.337 kg)  09/15/15 317 lb (143.79 kg)  03/10/15 313 lb 8 oz (142.203 kg)     Lab Results  Component  Value Date   WBC 7.3 09/15/2015   HGB 11.6* 09/15/2015   HCT 32.8* 09/15/2015   PLT 302.0 09/15/2015   GLUCOSE 88 09/15/2015   CHOL 152 09/15/2015   TRIG 87.0 09/15/2015   HDL 35.10* 09/15/2015   LDLCALC 99 09/15/2015   ALT 10 09/15/2015   AST 10 09/15/2015   NA 139 09/15/2015   K 3.5 09/15/2015   CL 103 09/15/2015   CREATININE 0.67 09/15/2015   BUN 10 09/15/2015   CO2 28 09/15/2015   TSH 1.67 09/15/2015   HGBA1C 6.0 09/15/2015   MICROALBUR 0.50 12/15/2012    Lab Results  Component Value Date   TSH 1.67 09/15/2015   Lab Results  Component Value Date   WBC 7.3 09/15/2015   HGB 11.6* 09/15/2015   HCT 32.8* 09/15/2015   MCV 90.9 09/15/2015   PLT 302.0 09/15/2015   Lab Results  Component Value Date  NA 139 09/15/2015   K 3.5 09/15/2015   CO2 28 09/15/2015   GLUCOSE 88 09/15/2015   BUN 10 09/15/2015   CREATININE 0.67 09/15/2015   BILITOT 0.2 09/15/2015   ALKPHOS 74 09/15/2015   AST 10 09/15/2015   ALT 10 09/15/2015   PROT 7.4 09/15/2015   ALBUMIN 4.0 09/15/2015   CALCIUM 9.3 09/15/2015   GFR 121.15 09/15/2015   Lab Results  Component Value Date   CHOL 152 09/15/2015   Lab Results  Component Value Date   HDL 35.10* 09/15/2015   Lab Results  Component Value Date   LDLCALC 99 09/15/2015   Lab Results  Component Value Date   TRIG 87.0 09/15/2015   Lab Results  Component Value Date   CHOLHDL 4 09/15/2015   Lab Results  Component Value Date   HGBA1C 6.0 09/15/2015       Assessment & Plan:   Problem List Items Addressed This Visit    Diabetes mellitus type 2 in obese ([place])    hgba1c acceptable, minimize simple carbs. Increase exercise as tolerated. Continue current meds      Relevant Medications   valsartan-hydrochlorothiazide (DIOVAN-HCT) 160-12.5 MG tablet   Other Relevant Orders   Hemoglobin A1c (Completed)   Essential hypertension    Well controlled, no changes to meds. Encouraged heart healthy diet such as the DASH diet and exercise  as tolerated.       Relevant Medications   valsartan-hydrochlorothiazide (DIOVAN-HCT) 160-12.5 MG tablet   amLODipine (NORVASC) 5 MG tablet   Other Relevant Orders   CBC (Completed)   TSH (Completed)   Comprehensive metabolic panel (Completed)   Lipid panel (Completed)   Gastro-esophageal reflux    Avoid offending foods, start probiotics. Do not eat large meals in late evening and consider raising head of bed.       Relevant Medications   pantoprazole (PROTONIX) 40 MG tablet   Hypokalemia   Relevant Medications   valsartan-hydrochlorothiazide (DIOVAN-HCT) 160-12.5 MG tablet   potassium chloride SA (K-DUR,KLOR-CON) 20 MEQ tablet   Other Relevant Orders   Lipid panel (Completed)   Insomnia    Encouraged good sleep hygiene such as dark, quiet room. No blue/green glowing lights such as computer screens in bedroom. No alcohol or stimulants in evening. Cut down on caffeine as able. Regular exercise is helpful but not just prior to bed time. Can try Melatonin and L Typrtophan prn      Obesity    Encouraged DASH diet, decrease po intake and increase exercise as tolerated. Needs 7-8 hours of sleep nightly. Avoid trans fats, eat small, frequent meals every 4-5 hours with lean proteins, complex carbs and healthy fats. Minimize simple carbs, improved on recheck      Preventative health care    Patient encouraged to maintain heart healthy diet, regular exercise, adequate sleep. Consider daily probiotics. Take medications as prescribed. Labs reviewed      Sinusitis    Started on Doxycycline and Mucinex bid, Encouraged increased rest and hydration, add probiotics, zinc such as Coldeze or Xicam. Treat fevers as needed      Relevant Medications   fluticasone (FLONASE) 50 MCG/ACT nasal spray   fexofenadine (ALLEGRA ALLERGY) 180 MG tablet   doxycycline (VIBRA-TABS) 100 MG tablet    Other Visit Diagnoses    Allergic state, initial encounter    -  Primary    Relevant Medications     valsartan-hydrochlorothiazide (DIOVAN-HCT) 160-12.5 MG tablet    potassium chloride SA (K-DUR,KLOR-CON) 20 MEQ tablet  fluticasone (FLONASE) 50 MCG/ACT nasal spray    Benign essential HTN        Relevant Medications    valsartan-hydrochlorothiazide (DIOVAN-HCT) 160-12.5 MG tablet    potassium chloride SA (K-DUR,KLOR-CON) 20 MEQ tablet    amLODipine (NORVASC) 5 MG tablet    Need for prophylactic vaccination against Streptococcus pneumoniae (pneumococcus)           I have changed Ms. Moosman's valsartan-hydrochlorothiazide. I am also having her start on fexofenadine, montelukast, and doxycycline. Additionally, I am having her maintain her EPIPEN 2-PAK, potassium chloride SA, fluticasone, amLODipine, and pantoprazole.  Meds ordered this encounter  Medications  . valsartan-hydrochlorothiazide (DIOVAN-HCT) 160-12.5 MG tablet    Sig: Take 1 tablet by mouth daily.    Dispense:  90 tablet    Refill:  2  . potassium chloride SA (K-DUR,KLOR-CON) 20 MEQ tablet    Sig: TAKE 2 TABLETS (40 MEQ) BY MOUTH DAILY.    Dispense:  180 tablet    Refill:  2  . fluticasone (FLONASE) 50 MCG/ACT nasal spray    Sig: Place 2 sprays into both nostrils daily.    Dispense:  16 g    Refill:  11  . amLODipine (NORVASC) 5 MG tablet    Sig: Take 1 tablet (5 mg total) by mouth daily.    Dispense:  90 tablet    Refill:  2    D/C PREVIOUS SCRIPTS FOR THIS MEDICATION  . pantoprazole (PROTONIX) 40 MG tablet    Sig: Take 1 tablet (40 mg total) by mouth daily. Failed in past, Prevacid, Prilosec, Pepcid, Zantac and Mylanta    Dispense:  90 tablet    Refill:  2  . fexofenadine (ALLEGRA ALLERGY) 180 MG tablet    Sig: Take 1 tablet (180 mg total) by mouth daily.    Dispense:  301 tablet    Refill:  1    Dispense as written brand name only.  . montelukast (SINGULAIR) 10 MG tablet    Sig: Take 1 tablet (10 mg total) by mouth at bedtime.    Dispense:  30 tablet    Refill:  1  . doxycycline (VIBRA-TABS) 100 MG tablet     Sig: Take 1 tablet (100 mg total) by mouth 2 (two) times daily.    Dispense:  20 tablet    Refill:  0     Willette Alma, MD

## 2015-09-15 NOTE — Assessment & Plan Note (Addendum)
Encouraged DASH diet, decrease po intake and increase exercise as tolerated. Needs 7-8 hours of sleep nightly. Avoid trans fats, eat small, frequent meals every 4-5 hours with lean proteins, complex carbs and healthy fats. Minimize simple carbs, improved on recheck

## 2015-09-15 NOTE — Assessment & Plan Note (Signed)
Well controlled, no changes to meds. Encouraged heart healthy diet such as the DASH diet and exercise as tolerated.  °

## 2015-09-15 NOTE — Progress Notes (Signed)
Pre visit review using our clinic review tool, if applicable. No additional management support is needed unless otherwise documented below in the visit note. 

## 2015-09-16 LAB — COMPREHENSIVE METABOLIC PANEL
ALT: 10 U/L (ref 0–35)
AST: 10 U/L (ref 0–37)
Albumin: 4 g/dL (ref 3.5–5.2)
Alkaline Phosphatase: 74 U/L (ref 39–117)
BUN: 10 mg/dL (ref 6–23)
CHLORIDE: 103 meq/L (ref 96–112)
CO2: 28 meq/L (ref 19–32)
CREATININE: 0.67 mg/dL (ref 0.40–1.20)
Calcium: 9.3 mg/dL (ref 8.4–10.5)
GFR: 121.15 mL/min (ref >60.00)
Glucose, Bld: 88 mg/dL (ref 70–99)
Potassium: 3.5 mEq/L (ref 3.5–5.1)
SODIUM: 139 meq/L (ref 135–145)
Total Bilirubin: 0.2 mg/dL (ref 0.2–1.2)
Total Protein: 7.4 g/dL (ref 6.0–8.3)

## 2015-09-16 LAB — LIPID PANEL
CHOL/HDL RATIO: 4
Cholesterol: 152 mg/dL (ref 0–200)
HDL: 35.1 mg/dL — ABNORMAL LOW (ref >39.00)
LDL CALC: 99 mg/dL (ref 0–99)
NonHDL: 116.69
TRIGLYCERIDES: 87 mg/dL (ref 0.0–149.0)
VLDL: 17.4 mg/dL (ref 0.0–40.0)

## 2015-09-16 LAB — CBC
HCT: 32.8 % — ABNORMAL LOW (ref 36.0–46.0)
Hemoglobin: 11.6 g/dL — ABNORMAL LOW (ref 12.0–15.0)
MCHC: 35.4 g/dL (ref 30.0–36.0)
MCV: 90.9 fl (ref 78.0–100.0)
Platelets: 302 10*3/uL (ref 150.0–400.0)
RBC: 3.61 Mil/uL — ABNORMAL LOW (ref 3.87–5.11)
RDW: 14.3 % (ref 11.5–15.5)
WBC: 7.3 10*3/uL (ref 4.0–10.5)

## 2015-09-16 LAB — HEMOGLOBIN A1C: HEMOGLOBIN A1C: 6 % (ref 4.6–6.5)

## 2015-09-16 LAB — TSH: TSH: 1.67 u[IU]/mL (ref 0.35–4.50)

## 2015-09-20 ENCOUNTER — Other Ambulatory Visit: Payer: Self-pay | Admitting: Obstetrics and Gynecology

## 2015-09-21 ENCOUNTER — Encounter (HOSPITAL_COMMUNITY)
Admission: RE | Admit: 2015-09-21 | Discharge: 2015-09-21 | Disposition: A | Discharge status: Home / Self Care | Payer: BC Managed Care – PPO | Source: Ambulatory Visit | Attending: Obstetrics and Gynecology | Admitting: Obstetrics and Gynecology

## 2015-09-21 ENCOUNTER — Encounter (HOSPITAL_COMMUNITY): Payer: Self-pay

## 2015-09-21 DIAGNOSIS — D259 Leiomyoma of uterus, unspecified: Secondary | ICD-10-CM | POA: Diagnosis not present

## 2015-09-21 DIAGNOSIS — Z01812 Encounter for preprocedural laboratory examination: Secondary | ICD-10-CM | POA: Insufficient documentation

## 2015-09-21 DIAGNOSIS — Z01810 Encounter for preprocedural cardiovascular examination: Secondary | ICD-10-CM | POA: Insufficient documentation

## 2015-09-21 DIAGNOSIS — N920 Excessive and frequent menstruation with regular cycle: Secondary | ICD-10-CM | POA: Insufficient documentation

## 2015-09-21 DIAGNOSIS — Z880 Allergy status to penicillin: Secondary | ICD-10-CM | POA: Insufficient documentation

## 2015-09-21 DIAGNOSIS — Y829 Unspecified medical devices associated with adverse incidents: Secondary | ICD-10-CM | POA: Insufficient documentation

## 2015-09-21 DIAGNOSIS — Z79899 Other long term (current) drug therapy: Secondary | ICD-10-CM | POA: Insufficient documentation

## 2015-09-21 DIAGNOSIS — T8389XA Other specified complication of genitourinary prosthetic devices, implants and grafts, initial encounter: Secondary | ICD-10-CM | POA: Diagnosis not present

## 2015-09-21 HISTORY — DX: Gastro-esophageal reflux disease without esophagitis: K21.9

## 2015-09-21 LAB — TYPE AND SCREEN
ABO/RH(D): B POS
ANTIBODY SCREEN: NEGATIVE

## 2015-09-21 LAB — ABO/RH: ABO/RH(D): B POS

## 2015-09-21 NOTE — Patient Instructions (Addendum)
Your procedure is scheduled on:  Tuesday, Jan. 31, 2017  Enter through the Main Entrance of [facility] at:  10:30 AM  Pick up the phone at the desk and dial [phone].  Call this number if you have problems the morning of surgery: [phone].  Remember: Do NOT eat food after:  Midnight Monday, Jan. 30, 2017 Do NOT drink clear Liquids after:  8:00 am day of surgery   Take these medicines the morning of surgery with a SIP OF WATER: Amlodipine, Valsartan, Protonix, Potassium *Stop taking fish oil at this time   Do NOT wear jewelry (body piercing), metal hair clips/bobby pins, make-up, or nail polish. Do NOT wear lotions, powders, or perfumes.  You may wear deoderant. Do NOT shave for 48 hours prior to surgery. Do NOT bring valuables to the hospital. Contacts, dentures, or bridgework may not be worn into surgery.  Have a responsible adult drive you home and stay with you for 24 hours after your procedure.

## 2015-09-25 ENCOUNTER — Encounter: Payer: Self-pay | Admitting: Family Medicine

## 2015-09-25 DIAGNOSIS — Z0000 Encounter for general adult medical examination without abnormal findings: Secondary | ICD-10-CM

## 2015-09-25 DIAGNOSIS — J329 Chronic sinusitis, unspecified: Secondary | ICD-10-CM | POA: Insufficient documentation

## 2015-09-25 DIAGNOSIS — E876 Hypokalemia: Secondary | ICD-10-CM | POA: Insufficient documentation

## 2015-09-25 HISTORY — DX: Encounter for general adult medical examination without abnormal findings: Z00.00

## 2015-09-25 NOTE — Assessment & Plan Note (Signed)
Started on Doxycycline and Mucinex bid, Encouraged increased rest and hydration, add probiotics, zinc such as Coldeze or Xicam. Treat fevers as needed

## 2015-09-25 NOTE — Assessment & Plan Note (Signed)
Avoid offending foods, start probiotics. Do not eat large meals in late evening and consider raising head of bed.  

## 2015-09-25 NOTE — Assessment & Plan Note (Signed)
Patient encouraged to maintain heart healthy diet, regular exercise, adequate sleep. Consider daily probiotics. Take medications as prescribed. Labs reviewed 

## 2015-09-25 NOTE — Assessment & Plan Note (Signed)
hgba1c acceptable, minimize simple carbs. Increase exercise as tolerated. Continue current meds 

## 2015-09-28 MED FILL — VALSARTAN-HCTZ 160-12.5 MG: 160-12.5 | 90 days supply | Qty: 90 | Fill #0

## 2015-09-29 ENCOUNTER — Ambulatory Visit (INDEPENDENT_AMBULATORY_CARE_PROVIDER_SITE_OTHER): Payer: BC Managed Care – PPO

## 2015-09-29 DIAGNOSIS — J309 Allergic rhinitis, unspecified: Secondary | ICD-10-CM

## 2015-10-04 ENCOUNTER — Ambulatory Visit (HOSPITAL_COMMUNITY): Payer: BC Managed Care – PPO | Admitting: Anesthesiology

## 2015-10-04 ENCOUNTER — Encounter (HOSPITAL_COMMUNITY)
Admission: RE | Disposition: A | Discharge status: Home / Self Care | Payer: Self-pay | Source: Ambulatory Visit | Attending: Obstetrics and Gynecology

## 2015-10-04 ENCOUNTER — Ambulatory Visit (HOSPITAL_COMMUNITY)
Admission: RE | Admit: 2015-10-04 | Discharge: 2015-10-04 | Disposition: A | Discharge status: Home / Self Care | Payer: BC Managed Care – PPO | Source: Ambulatory Visit | Attending: Obstetrics and Gynecology | Admitting: Obstetrics and Gynecology

## 2015-10-04 DIAGNOSIS — T8332XA Displacement of intrauterine contraceptive device, initial encounter: Secondary | ICD-10-CM | POA: Insufficient documentation

## 2015-10-04 HISTORY — PX: HYSTEROSCOPY: SHX211

## 2015-10-04 LAB — GLUCOSE, CAPILLARY
GLUCOSE-CAPILLARY: 117 mg/dL — AB (ref 65–99)
Glucose-Capillary: 103 mg/dL — ABNORMAL HIGH (ref 65–99)

## 2015-10-04 LAB — PREGNANCY, URINE: Preg Test, Ur: NEGATIVE

## 2015-10-04 SURGERY — HYSTEROSCOPY
Anesthesia: General | Site: Vagina

## 2015-10-04 MED ORDER — PROPOFOL 10 MG/ML IV BOLUS
INTRAVENOUS | Status: AC
  Filled 2015-10-04: qty 40

## 2015-10-04 MED ORDER — OXYCODONE HCL 5 MG PO TABS: 5.0000 mg | ORAL_TABLET | Freq: Once | ORAL | Status: DC | PRN

## 2015-10-04 MED ORDER — FENTANYL CITRATE (PF) 100 MCG/2ML IJ SOLN
INTRAMUSCULAR | Status: DC | PRN
  Administered 2015-10-04 (×2): 100 ug via INTRAVENOUS

## 2015-10-04 MED ORDER — MIDAZOLAM HCL 2 MG/2ML IJ SOLN
INTRAMUSCULAR | Status: DC | PRN
Start: 2015-10-04 — End: 2015-10-04
  Administered 2015-10-04: 2 mg via INTRAVENOUS

## 2015-10-04 MED ORDER — ONDANSETRON HCL 4 MG/2ML IJ SOLN
INTRAMUSCULAR | Status: DC | PRN
  Administered 2015-10-04: 4 mg via INTRAVENOUS

## 2015-10-04 MED ORDER — MEPERIDINE HCL 25 MG/ML IJ SOLN: 6.2500 mg | INTRAMUSCULAR | Status: DC | PRN

## 2015-10-04 MED ORDER — HYDROCODONE-ACETAMINOPHEN 5-325 MG PO TABS: ORAL_TABLET | ORAL | Status: DC

## 2015-10-04 MED ORDER — DEXAMETHASONE SODIUM PHOSPHATE 4 MG/ML IJ SOLN
INTRAMUSCULAR | Status: AC
  Filled 2015-10-04: qty 1

## 2015-10-04 MED ORDER — SUCCINYLCHOLINE CHLORIDE 20 MG/ML IJ SOLN
INTRAMUSCULAR | Status: AC
Start: 2015-10-04 — End: 2015-10-04
  Filled 2015-10-04: qty 1

## 2015-10-04 MED ORDER — PHENYLEPHRINE 40 MCG/ML (10ML) SYRINGE FOR IV PUSH (FOR BLOOD PRESSURE SUPPORT)
PREFILLED_SYRINGE | INTRAVENOUS | Status: AC
  Filled 2015-10-04: qty 10

## 2015-10-04 MED ORDER — MIDAZOLAM HCL 2 MG/2ML IJ SOLN
INTRAMUSCULAR | Status: AC
  Filled 2015-10-04: qty 2

## 2015-10-04 MED ORDER — LIDOCAINE HCL 1 % IJ SOLN
INTRAMUSCULAR | Status: AC
  Filled 2015-10-04: qty 20

## 2015-10-04 MED ORDER — LACTATED RINGERS IV SOLN
INTRAVENOUS | Status: DC
  Administered 2015-10-04: 11:00:00 via INTRAVENOUS

## 2015-10-04 MED ORDER — ONDANSETRON HCL 4 MG/2ML IJ SOLN
INTRAMUSCULAR | Status: AC
  Filled 2015-10-04: qty 2

## 2015-10-04 MED ORDER — GLYCINE 1.5 % IR SOLN
Status: DC | PRN
  Administered 2015-10-04: 3000 mL

## 2015-10-04 MED ORDER — PHENYLEPHRINE HCL 10 MG/ML IJ SOLN
INTRAMUSCULAR | Status: DC | PRN
  Administered 2015-10-04: 40 ug via INTRAVENOUS
  Administered 2015-10-04 (×2): 80 ug via INTRAVENOUS

## 2015-10-04 MED ORDER — IBUPROFEN 600 MG PO TABS: 600.0000 mg | ORAL_TABLET | Freq: Four times a day (QID) | ORAL | Status: AC | PRN

## 2015-10-04 MED ORDER — LIDOCAINE HCL 1 % IJ SOLN
INTRAMUSCULAR | Status: DC | PRN
  Administered 2015-10-04: 10 mL

## 2015-10-04 MED ORDER — DEXAMETHASONE SODIUM PHOSPHATE 10 MG/ML IJ SOLN
INTRAMUSCULAR | Status: DC | PRN
  Administered 2015-10-04: 4 mg via INTRAVENOUS

## 2015-10-04 MED ORDER — BUPIVACAINE HCL (PF) 0.25 % IJ SOLN
INTRAMUSCULAR | Status: AC
  Filled 2015-10-04: qty 30

## 2015-10-04 MED ORDER — SCOPOLAMINE 1 MG/3DAYS TD PT72
1.0000 | MEDICATED_PATCH | Freq: Once | TRANSDERMAL | Status: DC
  Administered 2015-10-04: 1.5 mg via TRANSDERMAL

## 2015-10-04 MED ORDER — PROPOFOL 10 MG/ML IV BOLUS
INTRAVENOUS | Status: DC | PRN
  Administered 2015-10-04: 350 mg via INTRAVENOUS
  Administered 2015-10-04: 50 mg via INTRAVENOUS

## 2015-10-04 MED ORDER — OXYCODONE HCL 5 MG/5ML PO SOLN: 5.0000 mg | Freq: Once | ORAL | Status: DC | PRN

## 2015-10-04 MED ORDER — SUCCINYLCHOLINE CHLORIDE 20 MG/ML IJ SOLN
INTRAMUSCULAR | Status: DC | PRN
  Administered 2015-10-04: 180 mg via INTRAVENOUS

## 2015-10-04 MED ORDER — SCOPOLAMINE 1 MG/3DAYS TD PT72
MEDICATED_PATCH | TRANSDERMAL | Status: AC
  Filled 2015-10-04: qty 1

## 2015-10-04 MED ORDER — LACTATED RINGERS IV SOLN
INTRAVENOUS | Status: DC
  Administered 2015-10-04: 12:00:00 via INTRAVENOUS

## 2015-10-04 MED ORDER — HYDROMORPHONE HCL 1 MG/ML IJ SOLN: 0.2500 mg | INTRAMUSCULAR | Status: DC | PRN

## 2015-10-04 MED ORDER — LIDOCAINE HCL (CARDIAC) 20 MG/ML IV SOLN
INTRAVENOUS | Status: AC
  Filled 2015-10-04: qty 5

## 2015-10-04 MED ORDER — LIDOCAINE HCL (CARDIAC) 20 MG/ML IV SOLN
INTRAVENOUS | Status: DC | PRN
  Administered 2015-10-04: 60 mg via INTRAVENOUS

## 2015-10-04 MED ORDER — FENTANYL CITRATE (PF) 250 MCG/5ML IJ SOLN
INTRAMUSCULAR | Status: AC
  Filled 2015-10-04: qty 5

## 2015-10-04 MED ORDER — ROCURONIUM BROMIDE 100 MG/10ML IV SOLN
INTRAVENOUS | Status: AC
  Filled 2015-10-04: qty 1

## 2015-10-04 SURGICAL SUPPLY — 39 items
ABLATOR ENDOMETRIAL BIPOLAR (ABLATOR) IMPLANT
BAG SPEC RTRVL LRG 6X4 10 (ENDOMECHANICALS)
CANISTER SUCT 3000ML (MISCELLANEOUS) ×1 IMPLANT
CATH ROBINSON RED A/P 16FR (CATHETERS) ×3 IMPLANT
CLOTH BEACON ORANGE TIMEOUT ST (SAFETY) ×3 IMPLANT
CONT PATH 16OZ SNAP LID 3702 (MISCELLANEOUS) IMPLANT
CONTAINER PREFILL 10% NBF 60ML (FORM) ×6 IMPLANT
DECANTER SPIKE VIAL GLASS SM (MISCELLANEOUS) IMPLANT
DILATOR CANAL MILEX (MISCELLANEOUS) IMPLANT
DRSG COVADERM PLUS 2X2 (GAUZE/BANDAGES/DRESSINGS) ×2 IMPLANT
DRSG OPSITE POSTOP 3X4 (GAUZE/BANDAGES/DRESSINGS) IMPLANT
ELECT REM PT RETURN 9FT ADLT (ELECTROSURGICAL)
ELECTRODE REM PT RTRN 9FT ADLT (ELECTROSURGICAL) IMPLANT
GLOVE BIO SURGEON STRL SZ7 (GLOVE) ×3 IMPLANT
GLOVE BIOGEL PI IND STRL 7.0 (GLOVE) ×2 IMPLANT
GLOVE BIOGEL PI INDICATOR 7.0 (GLOVE) ×1
GOWN STRL REUS W/TWL LRG LVL3 (GOWN DISPOSABLE) ×4 IMPLANT
LIGASURE 5MM LAPAROSCOPIC (INSTRUMENTS) IMPLANT
LIQUID BAND (GAUZE/BANDAGES/DRESSINGS) IMPLANT
NDL SPNL 22GX3.5 QUINCKE BK (NEEDLE) IMPLANT
NEEDLE HYPO 22GX1.5 SAFETY (NEEDLE) IMPLANT
NEEDLE SPNL 22GX3.5 QUINCKE BK (NEEDLE) IMPLANT
NS IRRIG 1000ML POUR BTL (IV SOLUTION) ×3 IMPLANT
PACK LAPAROSCOPY BASIN (CUSTOM PROCEDURE TRAY) ×3 IMPLANT
PACK VAGINAL MINOR WOMEN LF (CUSTOM PROCEDURE TRAY) ×3 IMPLANT
PAD OB MATERNITY 4.3X12.25 (PERSONAL CARE ITEMS) ×3 IMPLANT
PAD TRENDELENBURG POSITION (MISCELLANEOUS) ×3 IMPLANT
POUCH SPECIMEN RETRIEVAL 10MM (ENDOMECHANICALS) IMPLANT
SLEEVE XCEL OPT CAN 5 100 (ENDOMECHANICALS) ×1 IMPLANT
SUT VIC AB 3-0 PS2 18 (SUTURE)
SUT VIC AB 3-0 PS2 18XBRD (SUTURE) ×1 IMPLANT
SUT VICRYL 0 UR6 27IN ABS (SUTURE) ×2 IMPLANT
TOWEL OR 17X24 6PK STRL BLUE (TOWEL DISPOSABLE) ×6 IMPLANT
TROCAR BALLN 12MMX100 BLUNT (TROCAR) ×1 IMPLANT
TROCAR OPTI TIP 5M 100M (ENDOMECHANICALS) ×2 IMPLANT
TUBING AQUILEX INFLOW (TUBING) ×3 IMPLANT
TUBING AQUILEX OUTFLOW (TUBING) ×3 IMPLANT
WARMER LAPAROSCOPE (MISCELLANEOUS) ×1 IMPLANT
WATER STERILE IRR 1000ML POUR (IV SOLUTION) ×3 IMPLANT

## 2015-10-04 NOTE — Anesthesia Preprocedure Evaluation (Addendum)
Anesthesia Evaluation  Patient identified by MRN, date of birth, ID band Patient awake    Reviewed: Allergy & Precautions, NPO status , Patient's Chart, lab work & pertinent test results  Airway Mallampati: II  TM Distance: >3 FB Neck ROM: Full    Dental  (+) Teeth Intact, Dental Advisory Given   Pulmonary former smoker,    breath sounds clear to auscultation       Cardiovascular hypertension, Pt. on medications  Rhythm:Regular Rate:Normal     Neuro/Psych    GI/Hepatic GERD  Medicated and Controlled,  Endo/Other  diabetes, Well ControlledMorbid obesity  Renal/GU      Musculoskeletal   Abdominal   Peds  Hematology   Anesthesia Other Findings   Reproductive/Obstetrics                             Anesthesia Physical Anesthesia Plan  ASA: II  Anesthesia Plan: General   Post-op Pain Management:    Induction: Intravenous  Airway Management Planned: Oral ETT  Additional Equipment:   Intra-op Plan:   Post-operative Plan: Extubation in OR  Informed Consent: I have reviewed the patients History and Physical, chart, labs and discussed the procedure including the risks, benefits and alternatives for the proposed anesthesia with the patient or authorized representative who has indicated his/her understanding and acceptance.   Dental advisory given  Plan Discussed with: CRNA, Anesthesiologist and Surgeon  Anesthesia Plan Comments:         Anesthesia Quick Evaluation

## 2015-10-04 NOTE — Discharge Instructions (Signed)
Hysteroscopy, Care After  Refer to this sheet in the next few weeks. These instructions provide you with information on caring for yourself after your procedure. Your health care provider may also give you more specific instructions. Your treatment has been planned according to current medical practices, but problems sometimes occur. Call your health care provider if you have any problems or questions after your procedure.   WHAT TO EXPECT AFTER THE PROCEDURE  After your procedure, it is typical to have the following:  · You may have some cramping. This normally lasts for a couple days.  · You may have bleeding. This can vary from light spotting for a few days to menstrual-like bleeding for 3-7 days.  HOME CARE INSTRUCTIONS  · Rest for the first 1-2 days after the procedure.  · Only take over-the-counter or prescription medicines as directed by your health care provider. Do not take aspirin. It can increase the chances of bleeding.  · Take showers instead of baths for 2 weeks or as directed by your health care provider.  · Do not drive for 24 hours or as directed.  · Do not drink alcohol while taking pain medicine.  · Do not use tampons, douche, or have sexual intercourse for 2 weeks or until your health care provider says it is okay.  · Take your temperature twice a day for 4-5 days. Write it down each time.  · Follow your health care provider's advice about diet, exercise, and lifting.  · If you develop constipation, you may:    Take a mild laxative if your health care provider approves.    Add bran foods to your diet.    Drink enough fluids to keep your urine clear or pale yellow.  · Try to have someone with you or available to you for the first 24-48 hours, especially if you were given a general anesthetic.  · Follow up with your health care provider as directed.  SEEK MEDICAL CARE IF:  · You feel dizzy or lightheaded.  · You feel sick to your stomach (nauseous).  · You have abnormal vaginal discharge.  · You  have a rash.  · You have pain that is not controlled with medicine.  SEEK IMMEDIATE MEDICAL CARE IF:  · You have bleeding that is heavier than a normal menstrual period.  · You have a fever.  · You have increasing cramps or pain, not controlled with medicine.  · You have new belly (abdominal) pain.  · You pass out.  · You have pain in the tops of your shoulders (shoulder strap areas).  · You have shortness of breath.     This information is not intended to replace advice given to you by your health care provider. Make sure you discuss any questions you have with your health care provider.     Document Released: 06/10/2013 Document Reviewed: 06/10/2013  Elsevier Interactive Patient Education ©2016 [employer]

## 2015-10-04 NOTE — Transfer of Care (Signed)
Immediate Anesthesia Transfer of Care Note  Patient: Brandi Lester  Procedure(s) Performed: Procedure(s): LAPAROSCOPY OPERATIVE (N/A) HYSTEROSCOPY (N/A)  Patient Location: PACU  Anesthesia Type:General  Level of Consciousness: awake, alert  and oriented  Airway & Oxygen Therapy: Patient Spontanous Breathing and Patient connected to face mask oxygen  Post-op Assessment: Report given to RN and Post -op Vital signs reviewed and stable  Post vital signs: Reviewed and stable  Last Vitals:  Filed Vitals:   10/04/15 1019 10/04/15 1042  BP:  135/60  Pulse: 104   Temp: 37.2 C   Resp: 20     Complications: No apparent anesthesia complications

## 2015-10-04 NOTE — Anesthesia Postprocedure Evaluation (Signed)
Anesthesia Post Note  Patient: Brandi Lester  Procedure(s) Performed: Procedure(s) (LRB): HYSTEROSCOPY with Removal IUD (N/A)  Patient location during evaluation: PACU Anesthesia Type: General Level of consciousness: awake and alert Pain management: pain level controlled Vital Signs Assessment: post-procedure vital signs reviewed and stable Respiratory status: spontaneous breathing, nonlabored ventilation, respiratory function stable and patient connected to nasal cannula oxygen Cardiovascular status: blood pressure returned to baseline and stable Postop Assessment: no signs of nausea or vomiting Anesthetic complications: no    Last Vitals:  Filed Vitals:   10/04/15 1302 10/04/15 1315  BP: 158/87 157/79  Pulse: 118 113  Temp: 37 C   Resp: 14 20    Last Pain:  Filed Vitals:   10/04/15 1320  PainSc: 2                  [name] A

## 2015-10-04 NOTE — Op Note (Signed)
Pre-Operative Diagnosis: 1) Malpositioned IUD Postoperative Diagnosis: Same Procedure: Hysteroscopic removal of malpositioned IUD Surgeon: Dr. Vanessa Kick Assistant: None Operative Findings: 16 week sized uterus. IUD strings noted within the cervix at the level of the internal cervical os. After the IUD was removed an the hysteroscope was replaced a small defect was noted in the posterior lower uterine segment just inside the internal os. Presumably the body of the iud was embedded in this area. The defect was hemostatic. The left tubal ostia was visualized. The right tubal ostia could not be visualized. The Endometrial cavity was normal appearing. The endometrial cavity was not formally sounded but the cavity was deep. Specimen: None EBL: Minimal  Brandi Lester Is a 50 year old female who presents for definitive surgical management for malpositioned uterus. Please see the patient's history and physical for complete details of the history. Management options were discussed with the patient. R/B/A reviewed. Following appropriate informed consent was taken to the operating room. The patient was appropriately identified during a time out procedure. General anesthesia was administered and the patient was placed in the dorsal lithotomy position. The patient was prepped and draped in the normal sterile fashion. A speculum was placed into the vagina, a single-tooth tenaculum was placed on the anterior lip of the cervix, and 10 cc of 1% lidocaine was administered in a paracervical fashion. The cervix was serially dilated with Hank dilators. The hysteroscope was then introduced for the above findings. The hysteroscopic graspers were used to grasp the IUD strings and the hysteroscope and IUD were removed without difficulty. The hysteroscope was reintroduced and the endometrial cavity was surveyed. This completed the procedure. The equipment was removed form the vagina. The patient was extubated in the operating room and  brought to PACU in stable condition

## 2015-10-04 NOTE — H&P (Addendum)
Brandi Lester is an 50 y.o. female.   50 yo presents for surgical management of a malpositioned IUD. She has a known fibroid uterus and had increased menstrual bleeding. She was managing her menorrhagia with a mirena IUD. On one ultrasound it was thought that the IUD was seen. On sonohystogram the IUD was not seen in the endometrial cavity. A flat plate xray showed the IUD was present in the abdominal cavity. Management options were reviewed with the patient and the decision was made to proceed with hysteroscopy and possible laparoscopy for malpositioned IUD. R/B/A reviewed and she wishes to proceed.   No LMP recorded.    Past Medical History  Diagnosis Date  . Allergy     allergic rhinitis  . Hypertension   . Obesity   . Fungus infection 10/13    Left great toe  . Ingrown nail 10/13    right foot next to the last toe  . Insomnia 04/20/2013  . Anemia 07/25/2013  . Dyslipidemia 07/25/2013  . Costochondritis 02/20/2015  . Pedal edema 02/20/2015  . Diabetes mellitus     Type II   previously 3 years ago - no longer on meds   . GERD (gastroesophageal reflux disease)   . Preventative health care 09/25/2015    Past Surgical History  Procedure Laterality Date  . Lasik      eye surgery  . Wisdom tooth extraction      Family History  Problem Relation Age of Onset  . Heart attack Mother 80  . Cancer Father     prostate  . Heart attack Sister 41  . Hypertension Other     Social History:  reports that she has quit smoking. She has never used smokeless tobacco. She reports that she drinks alcohol. She reports that she does not use illicit drugs.  Allergies:  Allergies  Allergen Reactions  . Penicillins Itching and Swelling    Has patient had a PCN reaction causing immediate rash, facial/tongue/throat swelling, SOB or lightheadedness with hypotension: yes Has patient had a PCN reaction causing severe rash involving mucus membranes or skin necrosis: no Has patient had a PCN  reaction that required hospitalization no Has patient had a PCN reaction occurring within the last 10 years: yes If all of the above answers are "NO", then may proceed with Cephalosporin use.     Prescriptions prior to admission  Medication Sig Dispense Refill Last Dose  . amLODipine (NORVASC) 5 MG tablet Take 1 tablet (5 mg total) by mouth daily. 90 tablet 2 10/04/2015 at 0700  . EPIPEN 2-PAK 0.3 MG/0.3ML SOAJ injection 0.3 mg once.    Taking  . fexofenadine (ALLEGRA ALLERGY) 180 MG tablet Take 1 tablet (180 mg total) by mouth daily. 301 tablet 1   . fluticasone (FLONASE) 50 MCG/ACT nasal spray Place 2 sprays into both nostrils daily. (Patient taking differently: Place 2 sprays into both nostrils daily as needed for allergies. ) 16 g 11   . montelukast (SINGULAIR) 10 MG tablet Take 1 tablet (10 mg total) by mouth at bedtime. 30 tablet 1   . Omega-3 Fatty Acids (FISH OIL PO) Take 1 capsule by mouth daily.     . pantoprazole (PROTONIX) 40 MG tablet Take 1 tablet (40 mg total) by mouth daily. Failed in past, Prevacid, Prilosec, Pepcid, Zantac and Mylanta (Patient taking differently: Take 40 mg by mouth daily as needed (heartburn). ) 90 tablet 2   . potassium chloride SA (K-DUR,KLOR-CON) 20 MEQ tablet TAKE 2 TABLETS (  40 MEQ) BY MOUTH DAILY. (Patient taking differently: Take 20 mEq by mouth 2 (two) times daily. ) 180 tablet 2 10/04/2015 at 0700  . TURMERIC PO Take 1 capsule by mouth daily.     . valsartan-hydrochlorothiazide (DIOVAN-HCT) 160-12.5 MG tablet Take 1 tablet by mouth daily. 90 tablet 2 10/04/2015 at 0700    ROS  Blood pressure 135/60, pulse 104, temperature 98.9 F (37.2 C), temperature source Oral, resp. rate 20, SpO2 100 %. Physical Exam   AOX3, NAD Abd soft   Results for orders placed or performed during the hospital encounter of 10/04/15 (from the past 24 hour(s))  Pregnancy, urine     Status: None   Collection Time: 10/04/15 10:30 AM  Result Value Ref Range   Preg Test, Ur  NEGATIVE NEGATIVE  Glucose, capillary     Status: Abnormal   Collection Time: 10/04/15 10:32 AM  Result Value Ref Range   Glucose-Capillary 103 (H) 65 - 99 mg/dL    No results found.  Assessment/Plan: 1) hysteroscopy, dilation and curettage with possible laparoscopy 2) SCDs  Lucky Trotta H. 10/04/2015, 11:48 AM

## 2015-10-04 NOTE — Anesthesia Procedure Notes (Signed)
Procedure Name: Intubation Date/Time: 10/04/2015 12:08 PM Performed by: Jonna Munro Pre-anesthesia Checklist: Patient identified, Emergency Drugs available, Suction available, Timeout performed and Patient being monitored Patient Re-evaluated:Patient Re-evaluated prior to inductionOxygen Delivery Method: Circle system utilized Preoxygenation: Pre-oxygenation with 100% oxygen Intubation Type: Combination inhalational/ intravenous induction and Rapid sequence Ventilation: Mask ventilation without difficulty Laryngoscope Size: Glidescope Grade View: Grade III Tube type: Oral Tube size: 7.0 mm Number of attempts: 2 Airway Equipment and Method: Patient positioned with wedge pillow and Stylet Placement Confirmation: ETT inserted through vocal cords under direct vision,  positive ETCO2 and breath sounds checked- equal and bilateral Secured at: 24 cm Tube secured with: Tape Dental Injury: Teeth and Oropharynx as per pre-operative assessment  Difficulty Due To: Difficulty was anticipated Comments: DL with MAC 3 x 1, unable to visualize cords, able to mask ventilate adequately VSS. DL x 1 with LoPro 4, cords visualized and intubated successfully, +ETCO2, +=BBS. O2Sat stable throughout intubation procedure.

## 2015-10-05 ENCOUNTER — Encounter (HOSPITAL_COMMUNITY): Payer: Self-pay | Admitting: Obstetrics and Gynecology

## 2015-10-20 ENCOUNTER — Ambulatory Visit (INDEPENDENT_AMBULATORY_CARE_PROVIDER_SITE_OTHER): Payer: BC Managed Care – PPO

## 2015-10-20 DIAGNOSIS — J309 Allergic rhinitis, unspecified: Secondary | ICD-10-CM

## 2015-10-20 MED FILL — AMLODIPINE BESYLATE 5 MG TA: 5 | 90 days supply | Qty: 90 | Fill #0

## 2015-10-20 MED FILL — PANTOPRAZOLE SOD DR 40 MG T: 40 | 90 days supply | Qty: 90 | Fill #0

## 2015-10-20 MED FILL — POTASSIUM CL ER 20 MEQ TABL: 20 | 90 days supply | Qty: 180 | Fill #0

## 2015-10-27 ENCOUNTER — Ambulatory Visit (INDEPENDENT_AMBULATORY_CARE_PROVIDER_SITE_OTHER): Payer: BC Managed Care – PPO

## 2015-10-27 DIAGNOSIS — J309 Allergic rhinitis, unspecified: Secondary | ICD-10-CM | POA: Diagnosis not present

## 2015-11-03 ENCOUNTER — Ambulatory Visit (INDEPENDENT_AMBULATORY_CARE_PROVIDER_SITE_OTHER): Payer: BC Managed Care – PPO

## 2015-11-03 DIAGNOSIS — J309 Allergic rhinitis, unspecified: Secondary | ICD-10-CM

## 2015-11-10 ENCOUNTER — Ambulatory Visit (INDEPENDENT_AMBULATORY_CARE_PROVIDER_SITE_OTHER): Payer: BC Managed Care – PPO

## 2015-11-10 DIAGNOSIS — J309 Allergic rhinitis, unspecified: Secondary | ICD-10-CM | POA: Diagnosis not present

## 2015-11-17 ENCOUNTER — Ambulatory Visit (INDEPENDENT_AMBULATORY_CARE_PROVIDER_SITE_OTHER): Payer: BC Managed Care – PPO

## 2015-11-17 DIAGNOSIS — J309 Allergic rhinitis, unspecified: Secondary | ICD-10-CM | POA: Diagnosis not present

## 2015-12-15 ENCOUNTER — Ambulatory Visit (INDEPENDENT_AMBULATORY_CARE_PROVIDER_SITE_OTHER): Payer: BC Managed Care – PPO

## 2015-12-15 DIAGNOSIS — J309 Allergic rhinitis, unspecified: Secondary | ICD-10-CM | POA: Diagnosis not present

## 2015-12-19 ENCOUNTER — Other Ambulatory Visit: Payer: Self-pay | Admitting: *Deleted

## 2015-12-19 MED ORDER — EPINEPHRINE 0.3 MG/0.3ML IJ SOAJ: INTRAMUSCULAR | Status: DC

## 2015-12-26 MED FILL — VALSARTAN-HCTZ 160-12.5 MG: 160-12.5 | 90 days supply | Qty: 90 | Fill #1

## 2016-01-05 DIAGNOSIS — J301 Allergic rhinitis due to pollen: Secondary | ICD-10-CM | POA: Diagnosis not present

## 2016-01-06 DIAGNOSIS — J3089 Other allergic rhinitis: Secondary | ICD-10-CM | POA: Diagnosis not present

## 2016-01-12 ENCOUNTER — Ambulatory Visit (INDEPENDENT_AMBULATORY_CARE_PROVIDER_SITE_OTHER): Payer: BC Managed Care – PPO

## 2016-01-12 DIAGNOSIS — J309 Allergic rhinitis, unspecified: Secondary | ICD-10-CM | POA: Diagnosis not present

## 2016-01-18 MED FILL — POTASSIUM CL ER 20 MEQ TABL: 20 | 90 days supply | Qty: 180 | Fill #1

## 2016-01-18 MED FILL — MONTELUKAST SOD 10 MG TAB: 10 | 30 days supply | Qty: 30 | Fill #1

## 2016-01-18 MED FILL — AMLODIPINE BESYLATE 5 MG TA: 5 | 90 days supply | Qty: 90 | Fill #1

## 2016-02-14 ENCOUNTER — Ambulatory Visit (INDEPENDENT_AMBULATORY_CARE_PROVIDER_SITE_OTHER): Payer: BC Managed Care – PPO

## 2016-02-14 DIAGNOSIS — J309 Allergic rhinitis, unspecified: Secondary | ICD-10-CM | POA: Diagnosis not present

## 2016-03-15 ENCOUNTER — Encounter: Payer: Self-pay | Admitting: Family Medicine

## 2016-03-15 ENCOUNTER — Ambulatory Visit (INDEPENDENT_AMBULATORY_CARE_PROVIDER_SITE_OTHER): Payer: BC Managed Care – PPO | Admitting: Family Medicine

## 2016-03-15 VITALS — BP 140/98 | HR 93 | Temp 99.3°F | Ht 70.0 in | Wt 316.4 lb

## 2016-03-15 DIAGNOSIS — D649 Anemia, unspecified: Secondary | ICD-10-CM | POA: Diagnosis not present

## 2016-03-15 DIAGNOSIS — E785 Hyperlipidemia, unspecified: Secondary | ICD-10-CM

## 2016-03-15 DIAGNOSIS — E119 Type 2 diabetes mellitus without complications: Secondary | ICD-10-CM | POA: Diagnosis not present

## 2016-03-15 DIAGNOSIS — E669 Obesity, unspecified: Secondary | ICD-10-CM

## 2016-03-15 DIAGNOSIS — E1169 Type 2 diabetes mellitus with other specified complication: Secondary | ICD-10-CM

## 2016-03-15 DIAGNOSIS — I10 Essential (primary) hypertension: Secondary | ICD-10-CM | POA: Diagnosis not present

## 2016-03-15 DIAGNOSIS — K219 Gastro-esophageal reflux disease without esophagitis: Secondary | ICD-10-CM

## 2016-03-15 LAB — LIPID PANEL
CHOL/HDL RATIO: 4
Cholesterol: 130 mg/dL (ref 0–200)
HDL: 31.2 mg/dL — AB (ref >39.00)
LDL CALC: 82 mg/dL (ref 0–99)
NONHDL: 98.95
Triglycerides: 83 mg/dL (ref 0.0–149.0)
VLDL: 16.6 mg/dL (ref 0.0–40.0)

## 2016-03-15 LAB — CBC
HEMATOCRIT: 36.8 % (ref 36.0–46.0)
HEMOGLOBIN: 12.2 g/dL (ref 12.0–15.0)
MCHC: 33.1 g/dL (ref 30.0–36.0)
MCV: 82.5 fl (ref 78.0–100.0)
Platelets: 304 10*3/uL (ref 150.0–400.0)
RBC: 4.47 Mil/uL (ref 3.87–5.11)
RDW: 14.5 % (ref 11.5–15.5)
WBC: 7.2 10*3/uL (ref 4.0–10.5)

## 2016-03-15 LAB — TSH: TSH: 2.38 u[IU]/mL (ref 0.35–4.50)

## 2016-03-15 LAB — COMPREHENSIVE METABOLIC PANEL
ALT: 11 U/L (ref 0–35)
AST: 12 U/L (ref 0–37)
Albumin: 4 g/dL (ref 3.5–5.2)
Alkaline Phosphatase: 71 U/L (ref 39–117)
BUN: 8 mg/dL (ref 6–23)
CHLORIDE: 103 meq/L (ref 96–112)
CO2: 30 meq/L (ref 19–32)
Calcium: 9.2 mg/dL (ref 8.4–10.5)
Creatinine, Ser: 0.7 mg/dL (ref 0.40–1.20)
GFR: 114.94 mL/min (ref >60.00)
GLUCOSE: 103 mg/dL — AB (ref 70–99)
POTASSIUM: 3.1 meq/L — AB (ref 3.5–5.1)
SODIUM: 140 meq/L (ref 135–145)
TOTAL PROTEIN: 7.4 g/dL (ref 6.0–8.3)
Total Bilirubin: 0.3 mg/dL (ref 0.2–1.2)

## 2016-03-15 LAB — HEMOGLOBIN A1C: HEMOGLOBIN A1C: 6 % (ref 4.6–6.5)

## 2016-03-15 MED ORDER — VALSARTAN-HYDROCHLOROTHIAZIDE 320-25 MG PO TABS: 1.0000 | ORAL_TABLET | Freq: Every day | ORAL | Status: DC

## 2016-03-15 MED FILL — VALSARTAN-HCTZ 320-25 MG TA: 320-25 | 90 days supply | Qty: 90 | Fill #0

## 2016-03-15 MED FILL — ONE TOUCH ULTRA TEST STRIPS: 50 days supply | Qty: 100 | Fill #0

## 2016-03-15 MED FILL — ONE TOUCH DELICA 33G LANCET: 50 days supply | Qty: 100 | Fill #0

## 2016-03-15 MED FILL — PANTOPRAZOLE SOD DR 40 MG T: 40 | 90 days supply | Qty: 90 | Fill #1

## 2016-03-15 MED FILL — ONE TOUCH ULTRA 2 GLUCOSE S: W/DEVICE | 30 days supply | Qty: 1 | Fill #0

## 2016-03-15 NOTE — Patient Instructions (Signed)

## 2016-03-15 NOTE — Progress Notes (Signed)
Pre visit review using our clinic review tool, if applicable. No additional management support is needed unless otherwise documented below in the visit note. 

## 2016-03-15 NOTE — Assessment & Plan Note (Signed)
hgba1c acceptable, minimize simple carbs. Increase exercise as tolerated. Continue current meds 

## 2016-03-16 ENCOUNTER — Other Ambulatory Visit: Payer: Self-pay | Admitting: Family Medicine

## 2016-03-16 ENCOUNTER — Encounter: Payer: Self-pay | Admitting: Family Medicine

## 2016-03-16 ENCOUNTER — Ambulatory Visit (INDEPENDENT_AMBULATORY_CARE_PROVIDER_SITE_OTHER): Payer: BC Managed Care – PPO | Admitting: *Deleted

## 2016-03-16 DIAGNOSIS — J309 Allergic rhinitis, unspecified: Secondary | ICD-10-CM | POA: Diagnosis not present

## 2016-03-16 DIAGNOSIS — E876 Hypokalemia: Secondary | ICD-10-CM

## 2016-03-23 ENCOUNTER — Encounter: Payer: Self-pay | Admitting: Family Medicine

## 2016-03-23 ENCOUNTER — Other Ambulatory Visit: Payer: Self-pay | Admitting: Family Medicine

## 2016-03-23 ENCOUNTER — Other Ambulatory Visit (INDEPENDENT_AMBULATORY_CARE_PROVIDER_SITE_OTHER): Payer: BC Managed Care – PPO

## 2016-03-23 DIAGNOSIS — E876 Hypokalemia: Secondary | ICD-10-CM

## 2016-03-23 DIAGNOSIS — I10 Essential (primary) hypertension: Secondary | ICD-10-CM

## 2016-03-23 LAB — COMPREHENSIVE METABOLIC PANEL
ALT: 11 U/L (ref 0–35)
AST: 10 U/L (ref 0–37)
Albumin: 4.1 g/dL (ref 3.5–5.2)
Alkaline Phosphatase: 75 U/L (ref 39–117)
BILIRUBIN TOTAL: 0.3 mg/dL (ref 0.2–1.2)
BUN: 12 mg/dL (ref 6–23)
CALCIUM: 9.4 mg/dL (ref 8.4–10.5)
CHLORIDE: 103 meq/L (ref 96–112)
CO2: 29 meq/L (ref 19–32)
CREATININE: 0.85 mg/dL (ref 0.40–1.20)
GFR: 91.86 mL/min (ref >60.00)
GLUCOSE: 113 mg/dL — AB (ref 70–99)
Potassium: 3.3 mEq/L — ABNORMAL LOW (ref 3.5–5.1)
SODIUM: 138 meq/L (ref 135–145)
Total Protein: 7.6 g/dL (ref 6.0–8.3)

## 2016-04-01 NOTE — Progress Notes (Signed)
Patient ID: Brandi Lester, female   DOB: 09-08-1965, 50 y.o.   MRN: [id number]   Subjective:    Patient ID: Brandi Lester, female    DOB: 04-22-1966, 50 y.o.   MRN: [id number]  Chief Complaint  Patient presents with  . Follow-up    HPI Patient is in today for follow up. Is noting some recent sinus congestion, facial pressure and notes it is improving. No fevers or other sign of acute illness. No recent hospitalizations. Denies CP/palp/SOB/HA/fevers/GI or GU c/o. Taking meds as prescribed  Past Medical History:  Diagnosis Date  . Allergy    allergic rhinitis  . Anemia 07/25/2013  . Costochondritis 02/20/2015  . Diabetes mellitus    Type II   previously 3 years ago - no longer on meds   . Dyslipidemia 07/25/2013  . Fungus infection 10/13   Left great toe  . GERD (gastroesophageal reflux disease)   . Hypertension   . Ingrown nail 10/13   right foot next to the last toe  . Insomnia 04/20/2013  . Obesity   . Pedal edema 02/20/2015  . Preventative health care 09/25/2015    Past Surgical History:  Procedure Laterality Date  . HYSTEROSCOPY N/A 10/04/2015   Procedure: HYSTEROSCOPY with Removal IUD;  Surgeon: Vanessa Kick, MD;  Location: [facility];  Service: Gynecology;  Laterality: N/A;  . lasik     eye surgery  . WISDOM TOOTH EXTRACTION      Family History  Problem Relation Age of Onset  . Heart attack Mother 39  . Cancer Father     prostate  . Heart attack Sister 45  . Hypertension Other     Social History   Social History  . Marital status: Single    Spouse name: N/A  . Number of children: N/A  . Years of education: N/A   Occupational History  . Not on file.   Social History Main Topics  . Smoking status: Former Research scientist (life sciences)  . Smokeless tobacco: Never Used  . Alcohol use Yes     Comment: rare  . Drug use: No  . Sexual activity: Not on file   Other Topics Concern  . Not on file   Social History Narrative  . No narrative on file    Outpatient Medications  Prior to Visit  Medication Sig Dispense Refill  . amLODipine (NORVASC) 5 MG tablet Take 1 tablet (5 mg total) by mouth daily. 90 tablet 2  . EPINEPHrine (EPIPEN 2-PAK) 0.3 mg/0.3 mL IJ SOAJ injection Use as directed for severe allergic reaction 2 Device 1  . fexofenadine (ALLEGRA ALLERGY) 180 MG tablet Take 1 tablet (180 mg total) by mouth daily. 301 tablet 1  . fluticasone (FLONASE) 50 MCG/ACT nasal spray Place 2 sprays into both nostrils daily. (Patient taking differently: Place 2 sprays into both nostrils daily as needed for allergies. ) 16 g 11  . ibuprofen (ADVIL,MOTRIN) 600 MG tablet Take 1 tablet (600 mg total) by mouth every 6 (six) hours as needed. 90 tablet 0  . montelukast (SINGULAIR) 10 MG tablet Take 1 tablet (10 mg total) by mouth at bedtime. 30 tablet 1  . Omega-3 Fatty Acids (FISH OIL PO) Take 1 capsule by mouth daily.    . pantoprazole (PROTONIX) 40 MG tablet Take 1 tablet (40 mg total) by mouth daily. Failed in past, Prevacid, Prilosec, Pepcid, Zantac and Mylanta (Patient taking differently: Take 40 mg by mouth daily as needed (heartburn). ) 90 tablet 2  . potassium chloride  SA (K-DUR,KLOR-CON) 20 MEQ tablet TAKE 2 TABLETS (40 MEQ) BY MOUTH DAILY. (Patient taking differently: Take 20 mEq by mouth 2 (two) times daily. ) 180 tablet 2  . TURMERIC PO Take 1 capsule by mouth daily.    [name] HYDROcodone-acetaminophen (NORCO/VICODIN) 5-325 MG tablet 1-2 tablets every 4-6 hours as needed for pain 30 tablet 0  . valsartan-hydrochlorothiazide (DIOVAN-HCT) 160-12.5 MG tablet Take 1 tablet by mouth daily. 90 tablet 2   No facility-administered medications prior to visit.     Allergies  Allergen Reactions  . Penicillins Itching and Swelling    Has patient had a PCN reaction causing immediate rash, facial/tongue/throat swelling, SOB or lightheadedness with hypotension: yes Has patient had a PCN reaction causing severe rash involving mucus membranes or skin necrosis: no Has patient had a PCN  reaction that required hospitalization no Has patient had a PCN reaction occurring within the last 10 years: yes If all of the above answers are "NO", then may proceed with Cephalosporin use.     Review of Systems  Constitutional: Negative for fever and malaise/fatigue.  HENT: Positive for congestion.   Eyes: Negative for blurred vision.  Respiratory: Negative for shortness of breath.   Cardiovascular: Negative for chest pain, palpitations and leg swelling.  Gastrointestinal: Negative for abdominal pain, blood in stool and nausea.  Genitourinary: Negative for dysuria and frequency.  Musculoskeletal: Negative for falls.  Skin: Negative for rash.  Neurological: Negative for dizziness, loss of consciousness and headaches.  Endo/Heme/Allergies: Negative for environmental allergies.  Psychiatric/Behavioral: Negative for depression. The patient is not nervous/anxious.        Objective:    Physical Exam  Constitutional: She is oriented to person, place, and time. She appears well-developed and well-nourished. No distress.  HENT:  Head: Normocephalic and atraumatic.  Nose: Nose normal.  Eyes: Right eye exhibits no discharge. Left eye exhibits no discharge.  Neck: Normal range of motion. Neck supple.  Cardiovascular: Normal rate and regular rhythm.   No murmur heard. Pulmonary/Chest: Effort normal and breath sounds normal.  Abdominal: Soft. Bowel sounds are normal. There is no tenderness.  Musculoskeletal: She exhibits no edema.  Neurological: She is alert and oriented to person, place, and time.  Skin: Skin is warm and dry.  Psychiatric: She has a normal mood and affect.  Nursing note and vitals reviewed.   BP (!) 140/98   Pulse 93   Temp 99.3 F (37.4 C) (Oral)   Ht 5\' 10"  (1.778 m)   Wt (!) 316 lb 6 oz (143.5 kg)   SpO2 92%   BMI 45.40 kg/m  Wt Readings from Last 3 Encounters:  03/15/16 (!) 316 lb 6 oz (143.5 kg)  09/21/15 (!) 316 lb (143.3 kg)  09/15/15 (!) 317 lb  (143.8 kg)     Lab Results  Component Value Date   WBC 7.2 03/15/2016   HGB 12.2 03/15/2016   HCT 36.8 03/15/2016   PLT 304.0 03/15/2016   GLUCOSE 113 (H) 03/23/2016   CHOL 130 03/15/2016   TRIG 83.0 03/15/2016   HDL 31.20 (L) 03/15/2016   LDLCALC 82 03/15/2016   ALT 11 03/23/2016   AST 10 03/23/2016   NA 138 03/23/2016   K 3.3 (L) 03/23/2016   CL 103 03/23/2016   CREATININE 0.85 03/23/2016   BUN 12 03/23/2016   CO2 29 03/23/2016   TSH 2.38 03/15/2016   HGBA1C 6.0 03/15/2016   MICROALBUR 0.50 12/15/2012    Lab Results  Component Value Date   TSH  2.38 03/15/2016   Lab Results  Component Value Date   WBC 7.2 03/15/2016   HGB 12.2 03/15/2016   HCT 36.8 03/15/2016   MCV 82.5 03/15/2016   PLT 304.0 03/15/2016   Lab Results  Component Value Date   NA 138 03/23/2016   K 3.3 (L) 03/23/2016   CO2 29 03/23/2016   GLUCOSE 113 (H) 03/23/2016   BUN 12 03/23/2016   CREATININE 0.85 03/23/2016   BILITOT 0.3 03/23/2016   ALKPHOS 75 03/23/2016   AST 10 03/23/2016   ALT 11 03/23/2016   PROT 7.6 03/23/2016   ALBUMIN 4.1 03/23/2016   CALCIUM 9.4 03/23/2016   GFR 91.86 03/23/2016   Lab Results  Component Value Date   CHOL 130 03/15/2016   Lab Results  Component Value Date   HDL 31.20 (L) 03/15/2016   Lab Results  Component Value Date   LDLCALC 82 03/15/2016   Lab Results  Component Value Date   TRIG 83.0 03/15/2016   Lab Results  Component Value Date   CHOLHDL 4 03/15/2016   Lab Results  Component Value Date   HGBA1C 6.0 03/15/2016       Assessment & Plan:   Problem List Items Addressed This Visit    Obesity    Encouraged DASH diet, decrease po intake and increase exercise as tolerated. Needs 7-8 hours of sleep nightly. Avoid trans fats, eat small, frequent meals every 4-5 hours with lean proteins, complex carbs and healthy fats. Minimize simple carbs. Referred to nutritionist      Relevant Orders   CBC (Completed)   TSH (Completed)    Comprehensive metabolic panel (Completed)   Lipid panel (Completed)   Hemoglobin A1c (Completed)   Amb ref to Medical Nutrition Therapy-MNT   Essential hypertension    Increase Diovanhct to 320/25. Poorly controlled will alter medications, encouraged DASH diet, minimize caffeine and obtain adequate sleep. Report concerning symptoms and follow up as directed and as needed      Relevant Medications   valsartan-hydrochlorothiazide (DIOVAN-HCT) 320-25 MG tablet   Other Relevant Orders   CBC (Completed)   TSH (Completed)   Comprehensive metabolic panel (Completed)   Lipid panel (Completed)   Hemoglobin A1c (Completed)   Diabetes mellitus type 2 in obese ([place]) - Primary    hgba1c acceptable, minimize simple carbs. Increase exercise as tolerated. Continue current meds      Relevant Medications   valsartan-hydrochlorothiazide (DIOVAN-HCT) 320-25 MG tablet   Other Relevant Orders   CBC (Completed)   TSH (Completed)   Comprehensive metabolic panel (Completed)   Lipid panel (Completed)   Hemoglobin A1c (Completed)   Amb ref to Medical Nutrition Therapy-MNT   Anemia   Relevant Orders   CBC (Completed)   TSH (Completed)   Comprehensive metabolic panel (Completed)   Lipid panel (Completed)   Hemoglobin A1c (Completed)   Dyslipidemia   Relevant Orders   CBC (Completed)   TSH (Completed)   Comprehensive metabolic panel (Completed)   Lipid panel (Completed)   Hemoglobin A1c (Completed)   Gastro-esophageal reflux   Relevant Orders   CBC (Completed)   TSH (Completed)   Comprehensive metabolic panel (Completed)   Lipid panel (Completed)   Hemoglobin A1c (Completed)    Other Visit Diagnoses   None.     I have discontinued Ms. Lupton valsartan-hydrochlorothiazide and HYDROcodone-acetaminophen. I am also having her start on valsartan-hydrochlorothiazide. Additionally, I am having her maintain her potassium chloride SA, fluticasone, amLODipine, pantoprazole, fexofenadine,  montelukast, TURMERIC PO, Omega-3 Fatty Acids (FISH  OIL PO), ibuprofen, and EPINEPHrine.  Meds ordered this encounter  Medications  . valsartan-hydrochlorothiazide (DIOVAN-HCT) 320-25 MG tablet    Sig: Take 1 tablet by mouth daily.    Dispense:  90 tablet    Refill:  1     Penni Homans, MD

## 2016-04-01 NOTE — Assessment & Plan Note (Signed)
Encouraged DASH diet, decrease po intake and increase exercise as tolerated. Needs 7-8 hours of sleep nightly. Avoid trans fats, eat small, frequent meals every 4-5 hours with lean proteins, complex carbs and healthy fats. Minimize simple carbs. Referred to nutritionist

## 2016-04-01 NOTE — Assessment & Plan Note (Signed)
Encouraged heart healthy diet, increase exercise, avoid trans fats, consider a krill oil cap daily 

## 2016-04-01 NOTE — Assessment & Plan Note (Signed)
Increase Diovanhct to 320/25. Poorly controlled will alter medications, encouraged DASH diet, minimize caffeine and obtain adequate sleep. Report concerning symptoms and follow up as directed and as needed

## 2016-04-13 ENCOUNTER — Encounter: Payer: BC Managed Care – PPO | Attending: Family Medicine | Admitting: *Deleted

## 2016-04-13 DIAGNOSIS — E1169 Type 2 diabetes mellitus with other specified complication: Secondary | ICD-10-CM

## 2016-04-13 DIAGNOSIS — E669 Obesity, unspecified: Secondary | ICD-10-CM | POA: Diagnosis not present

## 2016-04-13 DIAGNOSIS — E119 Type 2 diabetes mellitus without complications: Secondary | ICD-10-CM | POA: Insufficient documentation

## 2016-04-13 DIAGNOSIS — Z713 Dietary counseling and surveillance: Secondary | ICD-10-CM | POA: Diagnosis present

## 2016-04-13 NOTE — Patient Instructions (Signed)
Plan:  Aim for 4 Carb Choices per meal (60 grams) +/- 1 either way  Aim for 0-2 Carbs per snack if hungry  Include protein in moderation with your meals and snacks Consider reading food labels for Total Carbohydrate and Fat Grams of foods Continue with your activity level daily as tolerated Continue checking BG at alternate times per day as directed by MD  Consider checking BG after exercise to see the benefit

## 2016-04-16 MED FILL — AMLODIPINE BESYLATE 5 MG TA: 5 | 90 days supply | Qty: 90 | Fill #2

## 2016-04-16 MED FILL — POTASSIUM CL ER 20 MEQ TABL: 20 | 90 days supply | Qty: 180 | Fill #2

## 2016-04-17 ENCOUNTER — Other Ambulatory Visit (INDEPENDENT_AMBULATORY_CARE_PROVIDER_SITE_OTHER): Payer: BC Managed Care – PPO

## 2016-04-17 ENCOUNTER — Ambulatory Visit: Payer: BC Managed Care – PPO | Admitting: Family Medicine

## 2016-04-17 VITALS — BP 136/92 | HR 86

## 2016-04-17 DIAGNOSIS — I10 Essential (primary) hypertension: Secondary | ICD-10-CM | POA: Diagnosis not present

## 2016-04-17 LAB — COMPREHENSIVE METABOLIC PANEL
ALK PHOS: 78 U/L (ref 39–117)
ALT: 9 U/L (ref 0–35)
AST: 9 U/L (ref 0–37)
Albumin: 3.9 g/dL (ref 3.5–5.2)
BILIRUBIN TOTAL: 0.3 mg/dL (ref 0.2–1.2)
BUN: 9 mg/dL (ref 6–23)
CALCIUM: 9.2 mg/dL (ref 8.4–10.5)
CO2: 28 meq/L (ref 19–32)
CREATININE: 0.74 mg/dL (ref 0.40–1.20)
Chloride: 102 mEq/L (ref 96–112)
GFR: 107.76 mL/min (ref >60.00)
GLUCOSE: 114 mg/dL — AB (ref 70–99)
Potassium: 3.4 mEq/L — ABNORMAL LOW (ref 3.5–5.1)
Sodium: 138 mEq/L (ref 135–145)
TOTAL PROTEIN: 7.1 g/dL (ref 6.0–8.3)

## 2016-04-17 MED ORDER — AMLODIPINE BESYLATE 5 MG PO TABS: 5.0000 mg | ORAL_TABLET | Freq: Two times a day (BID) | ORAL | 2 refills | Status: DC

## 2016-04-17 NOTE — Progress Notes (Signed)
Pre visit review using our clinic review tool, if applicable. No additional management support is needed unless otherwise documented below in the visit note.  Per 03/15/16 AVS: 1 mn RN visit for BP check and cmp   Patient reports compliance with BP medication and has already taken medications today.  Per Dr. Charlett Blake: Increase amlodipine to 5 mg BID. Follow-up with PCP in 3 months.   Patient notified of provider recommendation via phone as she preferred not to wait in office and verbalized understanding. Follow-up appt scheduled. Medication filled to pharmacy as requested.   Dorrene German, RN

## 2016-04-19 ENCOUNTER — Ambulatory Visit (INDEPENDENT_AMBULATORY_CARE_PROVIDER_SITE_OTHER): Payer: BC Managed Care – PPO

## 2016-04-19 DIAGNOSIS — J309 Allergic rhinitis, unspecified: Secondary | ICD-10-CM

## 2016-04-24 ENCOUNTER — Ambulatory Visit (INDEPENDENT_AMBULATORY_CARE_PROVIDER_SITE_OTHER): Payer: BC Managed Care – PPO | Admitting: *Deleted

## 2016-04-24 DIAGNOSIS — J309 Allergic rhinitis, unspecified: Secondary | ICD-10-CM

## 2016-04-27 ENCOUNTER — Encounter: Payer: Self-pay | Admitting: *Deleted

## 2016-04-27 NOTE — Progress Notes (Signed)
Diabetes Self-Management Education  Visit Type: First/Initial  Appt. Start Time: 1000 Appt. End Time: 1130  04/27/2016  [name], identified by name and date of birth, is a 49 y.o. female with a diagnosis of Diabetes: Type 2. 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 She states she has enjoyed water aerobics in the past and may get back into that.   ASSESSMENT  Height 5\' 10"  (1.778 m), weight (!) 319 lb (144.7 kg). Body mass index is 45.77 kg/m.    Individualized Plan for Diabetes Self-Management Training:   Learning Objective:  Patient will have a greater understanding of diabetes self-management. Patient education plan is to attend individual and/or group sessions per assessed needs and concerns.   Plan:   Patient Instructions  Plan:  Aim for 4 Carb Choices per meal (60 grams) +/- 1 either way  Aim for 0-2 Carbs per snack if hungry  Include protein in moderation with your meals and snacks Consider reading food labels for Total Carbohydrate and Fat Grams of foods Continue with your activity level daily as tolerated Continue checking BG at alternate times per day as directed by MD  Consider checking BG after exercise to see the benefit      Expected Outcomes:  Demonstrated interest in learning. Expect positive outcomes  Education material provided: Food label handouts, Meal plan card, Carbohydrate counting sheet and No sodium seasonings  If problems or questions, patient to contact team via:  Phone and Email  Future DSME appointment: 4-6 wks

## 2016-05-08 ENCOUNTER — Ambulatory Visit (INDEPENDENT_AMBULATORY_CARE_PROVIDER_SITE_OTHER): Payer: BC Managed Care – PPO

## 2016-05-08 DIAGNOSIS — J309 Allergic rhinitis, unspecified: Secondary | ICD-10-CM

## 2016-05-17 ENCOUNTER — Encounter: Payer: BC Managed Care – PPO | Attending: Family Medicine | Admitting: *Deleted

## 2016-05-17 ENCOUNTER — Ambulatory Visit (INDEPENDENT_AMBULATORY_CARE_PROVIDER_SITE_OTHER): Payer: BC Managed Care – PPO | Admitting: *Deleted

## 2016-05-17 DIAGNOSIS — E1169 Type 2 diabetes mellitus with other specified complication: Secondary | ICD-10-CM

## 2016-05-17 DIAGNOSIS — Z713 Dietary counseling and surveillance: Secondary | ICD-10-CM | POA: Diagnosis present

## 2016-05-17 DIAGNOSIS — J309 Allergic rhinitis, unspecified: Secondary | ICD-10-CM | POA: Diagnosis not present

## 2016-05-17 DIAGNOSIS — E669 Obesity, unspecified: Secondary | ICD-10-CM | POA: Insufficient documentation

## 2016-05-17 DIAGNOSIS — E119 Type 2 diabetes mellitus without complications: Secondary | ICD-10-CM | POA: Insufficient documentation

## 2016-05-17 NOTE — Patient Instructions (Signed)
Plan:  Aim for 4 Carb Choices per meal (60 grams) +/- 1 either way  Aim for 0-2 Carbs per snack if hungry  Include protein in moderation with your meals and snacks Consider reading food labels for Total Carbohydrate and Fat Grams of foods Continue with your activity level daily and increase intensity as tolerated every 2 weeks Water your flowers in AM instead of getting on the scale Continue checking BG at alternate times per day [employer] job! Consider checking BG after exercise to see the benefit

## 2016-05-17 NOTE — Progress Notes (Signed)
Diabetes Self-Management Education  Visit Type:  Follow-up  Appt. Start Time: 1615 Appt. End Time: [id number]  05/17/2016  [name], identified by name and date of birth, is a 50 y.o. female with a diagnosis of Diabetes: Type 2.  She at first declined weighing due to anticipated weight gain. She provided current diet history which shows some improvement in her eating habits with less fat at breakfast and eating out less often. She also reported better food choices when eating out. She states she is walking around the track during recess 4 days a week for 25 minutes and her BG's have all been within the Target Ranges.  ASSESSMENT  Height 5\' 10"  (1.778 m), weight (!) 324 lb (147 kg). Body mass index is 46.49 kg/m.   Weighed on Tanita Scale today: 05/17/16 TANITA  BODY COMP RESULTS   Weight  (lbs) 325.2   BMI (kg/m^2) 46.7   Fat Mass (lbs) 151.8   Fat Free Mass (lbs) 173.4   Total Body Water (lbs) 128.8              Diabetes Self-Management Education - 05/17/16 1709      Psychosocial Assessment   Patient Belief/Attitude about Diabetes Motivated to manage diabetes   Self-care barriers None   Patient Concerns Nutrition/Meal planning;Weight Control     Complications   How often do you check your blood sugar? 1-2 times/day     Individualized Goals (developed by patient)   Nutrition Follow meal plan discussed;General guidelines for healthy choices and portions discussed   Physical Activity Exercise 3-5 times per week  increase intensity by walking faster and getting more laps in during same time period   Medications Not Applicable   Monitoring  test blood glucose pre and post meals as discussed     Patient Self-Evaluation of Goals - Patient rates self as meeting previously set goals (% of time)   Nutrition >75%   Physical Activity 50 - 75 %   Medications Not Applicable   Monitoring >75%   Reducing Risk >75%   Health Coping >75%     Outcomes   Program Status Not  Completed     Subsequent Visit   Since your last visit, are you checking your blood glucose at least once a day? Yes      Learning Objective:  Patient will have a greater understanding of diabetes self-management. Patient education plan is to attend individual and/or group sessions per assessed needs and concerns.    Plan:   Patient Instructions  Plan:  Aim for 4 Carb Choices per meal (60 grams) +/- 1 either way  Aim for 0-2 Carbs per snack if hungry  Include protein in moderation with your meals and snacks Consider reading food labels for Total Carbohydrate and Fat Grams of foods Continue with your activity level daily and increase intensity as tolerated every 2 weeks Water your flowers in AM instead of getting on the scale Continue checking BG at alternate times per day [place] job! Consider checking BG after exercise to see the benefit       Expected Outcomes:  Demonstrated interest in learning. Expect positive outcomes  Education material provided: Tanita Scale info  If problems or questions, patient to contact team via:  Phone and Email  Future DSME appointment: - 4-6 wks

## 2016-05-18 ENCOUNTER — Other Ambulatory Visit: Payer: Self-pay | Admitting: Obstetrics and Gynecology

## 2016-05-18 DIAGNOSIS — Z1231 Encounter for screening mammogram for malignant neoplasm of breast: Secondary | ICD-10-CM

## 2016-05-22 ENCOUNTER — Ambulatory Visit (INDEPENDENT_AMBULATORY_CARE_PROVIDER_SITE_OTHER): Payer: BC Managed Care – PPO

## 2016-05-22 DIAGNOSIS — J309 Allergic rhinitis, unspecified: Secondary | ICD-10-CM

## 2016-05-25 ENCOUNTER — Encounter: Payer: Self-pay | Admitting: Family Medicine

## 2016-05-30 MED FILL — VALSARTAN-HCTZ 320-25 MG TA: 320-25 | 90 days supply | Qty: 90 | Fill #1

## 2016-05-31 MED FILL — AMLODIPINE BESYLATE 10 MG T: 10 | 30 days supply | Qty: 30 | Fill #0

## 2016-06-01 ENCOUNTER — Ambulatory Visit (INDEPENDENT_AMBULATORY_CARE_PROVIDER_SITE_OTHER): Payer: BC Managed Care – PPO

## 2016-06-01 DIAGNOSIS — J309 Allergic rhinitis, unspecified: Secondary | ICD-10-CM

## 2016-06-19 ENCOUNTER — Ambulatory Visit: Payer: BC Managed Care – PPO | Admitting: Allergy and Immunology

## 2016-06-20 ENCOUNTER — Ambulatory Visit: Payer: BC Managed Care – PPO | Admitting: *Deleted

## 2016-06-21 ENCOUNTER — Ambulatory Visit: Payer: BC Managed Care – PPO | Admitting: Family Medicine

## 2016-06-26 ENCOUNTER — Encounter: Payer: Self-pay | Admitting: Family Medicine

## 2016-06-26 ENCOUNTER — Ambulatory Visit (INDEPENDENT_AMBULATORY_CARE_PROVIDER_SITE_OTHER): Payer: BC Managed Care – PPO | Admitting: Family Medicine

## 2016-06-26 VITALS — BP 132/72 | HR 98 | Temp 98.6°F | Ht 70.0 in | Wt 325.0 lb

## 2016-06-26 DIAGNOSIS — E876 Hypokalemia: Secondary | ICD-10-CM | POA: Diagnosis not present

## 2016-06-26 DIAGNOSIS — K219 Gastro-esophageal reflux disease without esophagitis: Secondary | ICD-10-CM

## 2016-06-26 DIAGNOSIS — Z23 Encounter for immunization: Secondary | ICD-10-CM | POA: Diagnosis not present

## 2016-06-26 DIAGNOSIS — M791 Myalgia: Secondary | ICD-10-CM

## 2016-06-26 DIAGNOSIS — T7840XA Allergy, unspecified, initial encounter: Secondary | ICD-10-CM

## 2016-06-26 DIAGNOSIS — I10 Essential (primary) hypertension: Secondary | ICD-10-CM

## 2016-06-26 DIAGNOSIS — E669 Obesity, unspecified: Secondary | ICD-10-CM

## 2016-06-26 DIAGNOSIS — M7918 Myalgia, other site: Secondary | ICD-10-CM

## 2016-06-26 DIAGNOSIS — E1169 Type 2 diabetes mellitus with other specified complication: Secondary | ICD-10-CM | POA: Diagnosis not present

## 2016-06-26 DIAGNOSIS — E6609 Other obesity due to excess calories: Secondary | ICD-10-CM

## 2016-06-26 DIAGNOSIS — E785 Hyperlipidemia, unspecified: Secondary | ICD-10-CM

## 2016-06-26 MED ORDER — VALSARTAN 320 MG PO TABS: 320.0000 mg | ORAL_TABLET | Freq: Every day | ORAL | 1 refills | Status: DC

## 2016-06-26 MED ORDER — AMLODIPINE BESYLATE 5 MG PO TABS: 5.0000 mg | ORAL_TABLET | Freq: Two times a day (BID) | ORAL | 2 refills | Status: DC

## 2016-06-26 MED ORDER — POTASSIUM CHLORIDE CRYS ER 20 MEQ PO TBCR: EXTENDED_RELEASE_TABLET | ORAL | 1 refills | Status: DC

## 2016-06-26 MED ORDER — MONTELUKAST SODIUM 10 MG PO TABS: 10.0000 mg | ORAL_TABLET | Freq: Every day | ORAL | 1 refills | Status: DC

## 2016-06-26 MED ORDER — TRIAMTERENE-HCTZ 37.5-25 MG PO TABS
1.0000 | ORAL_TABLET | Freq: Every day | ORAL | 1 refills | Status: DC
Start: 2016-06-26 — End: 2016-11-06

## 2016-06-26 MED FILL — POTASSIUM CL ER 20 MEQ TABL: 20 | 90 days supply | Qty: 270 | Fill #0

## 2016-06-26 MED FILL — MONTELUKAST SOD 10 MG TAB: 10 | 90 days supply | Qty: 90 | Fill #0

## 2016-06-26 MED FILL — AMLODIPINE BESYLATE 10 MG T: 10 | 90 days supply | Qty: 90 | Fill #0

## 2016-06-26 NOTE — Progress Notes (Signed)
Pre visit review using our clinic review tool, if applicable. No additional management support is needed unless otherwise documented below in the visit note. 

## 2016-06-26 NOTE — Patient Instructions (Signed)
Hypertension Hypertension, commonly called high blood pressure, is when the force of blood pumping through your arteries is too strong. Your arteries are the blood vessels that carry blood from your heart throughout your body. A blood pressure reading consists of a higher number over a lower number, such as 110/72. The higher number (systolic) is the pressure inside your arteries when your heart pumps. The lower number (diastolic) is the pressure inside your arteries when your heart relaxes. Ideally you want your blood pressure below 120/80. Hypertension forces your heart to work harder to pump blood. Your arteries may become narrow or stiff. Having untreated or uncontrolled hypertension can cause heart attack, stroke, kidney disease, and other problems. RISK FACTORS Some risk factors for high blood pressure are controllable. Others are not.  Risk factors you cannot control include:   Race. You may be at higher risk if you are African American.  Age. Risk increases with age.  Gender. Men are at higher risk than women before age 45 years. After age 65, women are at higher risk than men. Risk factors you can control include:  Not getting enough exercise or physical activity.  Being overweight.  Getting too much fat, sugar, calories, or salt in your diet.  Drinking too much alcohol. SIGNS AND SYMPTOMS Hypertension does not usually cause signs or symptoms. Extremely high blood pressure (hypertensive crisis) may cause headache, anxiety, shortness of breath, and nosebleed. DIAGNOSIS To check if you have hypertension, your health care provider will measure your blood pressure while you are seated, with your arm held at the level of your heart. It should be measured at least twice using the same arm. Certain conditions can cause a difference in blood pressure between your right and left arms. A blood pressure reading that is higher than normal on one occasion does not mean that you need treatment. If  it is not clear whether you have high blood pressure, you may be asked to return on a different day to have your blood pressure checked again. Or, you may be asked to monitor your blood pressure at home for 1 or more weeks. TREATMENT Treating high blood pressure includes making lifestyle changes and possibly taking medicine. Living a healthy lifestyle can help lower high blood pressure. You may need to change some of your habits. Lifestyle changes may include:  Following the DASH diet. This diet is high in fruits, vegetables, and whole grains. It is low in salt, red meat, and added sugars.  Keep your sodium intake below 2,300 mg per day.  Getting at least 30-45 minutes of aerobic exercise at least 4 times per week.  Losing weight if necessary.  Not smoking.  Limiting alcoholic beverages.  Learning ways to reduce stress. Your health care provider may prescribe medicine if lifestyle changes are not enough to get your blood pressure under control, and if one of the following is true:  You are 18-59 years of age and your systolic blood pressure is above 140.  You are 60 years of age or older, and your systolic blood pressure is above 150.  Your diastolic blood pressure is above 90.  You have diabetes, and your systolic blood pressure is over 140 or your diastolic blood pressure is over 90.  You have kidney disease and your blood pressure is above 140/90.  You have heart disease and your blood pressure is above 140/90. Your personal target blood pressure may vary depending on your medical conditions, your age, and other factors. HOME CARE INSTRUCTIONS    Have your blood pressure rechecked as directed by your health care provider.   Take medicines only as directed by your health care provider. Follow the directions carefully. Blood pressure medicines must be taken as prescribed. The medicine does not work as well when you skip doses. Skipping doses also puts you at risk for  problems.  Do not smoke.   Monitor your blood pressure at home as directed by your health care provider. SEEK MEDICAL CARE IF:   You think you are having a reaction to medicines taken.  You have recurrent headaches or feel dizzy.  You have swelling in your ankles.  You have trouble with your vision. SEEK IMMEDIATE MEDICAL CARE IF:  You develop a severe headache or confusion.  You have unusual weakness, numbness, or feel faint.  You have severe chest or abdominal pain.  You vomit repeatedly.  You have trouble breathing. MAKE SURE YOU:   Understand these instructions.  Will watch your condition.  Will get help right away if you are not doing well or get worse.   This information is not intended to replace advice given to you by your health care provider. Make sure you discuss any questions you have with your health care provider.   Document Released: 08/20/2005 Document Revised: 01/04/2015 Document Reviewed: 06/12/2013 Elsevier Interactive Patient Education 2016 Elsevier Inc.  

## 2016-06-27 MED FILL — VALSARTAN 320 MG TABLET: 320 | 30 days supply | Qty: 30 | Fill #0

## 2016-06-27 MED FILL — TRIAMTERENE-HCTZ 37.5-25 MG: 37.5-25 | 30 days supply | Qty: 30 | Fill #0

## 2016-07-01 NOTE — Assessment & Plan Note (Signed)
Encouraged DASH diet, decrease po intake and increase exercise as tolerated. Needs 7-8 hours of sleep nightly. Avoid trans fats, eat small, frequent meals every 4-5 hours with lean proteins, complex carbs and healthy fats. Minimize simple carbs 

## 2016-07-01 NOTE — Assessment & Plan Note (Signed)
Encouraged heart healthy diet, increase exercise, avoid trans fats, consider a krill oil cap daily 

## 2016-07-01 NOTE — Assessment & Plan Note (Signed)
minimize simple carbs. Increase exercise as tolerated.  

## 2016-07-01 NOTE — Progress Notes (Signed)
Patient ID: Brandi Lester, female   DOB: Feb 07, 1966, 50 y.o.   MRN: [id number]   Subjective:    Patient ID: Brandi Lester, female    DOB: [birth year], 50 y.o.   MRN: [id number]  Chief Complaint  Patient presents with  . Follow-up    HPI Patient is in today for follow up. She is feeling well today but she does note she has been noting some palpitations with incresaed stress. Not lasting long and no associated symptoms. Denies CP/palp/SOB/HA/congestion/fevers/GI or GU c/o. Taking meds as prescribed  Past Medical History:  Diagnosis Date  . Allergy    allergic rhinitis  . Anemia 07/25/2013  . Costochondritis 02/20/2015  . Diabetes mellitus    Type II   previously 3 years ago - no longer on meds   . Dyslipidemia 07/25/2013  . Fungus infection 10/13   Left great toe  . GERD (gastroesophageal reflux disease)   . Hypertension   . Ingrown nail 10/13   right foot next to the last toe  . Insomnia 04/20/2013  . Obesity   . Pedal edema 02/20/2015  . Preventative health care 09/25/2015    Past Surgical History:  Procedure Laterality Date  . HYSTEROSCOPY N/A 10/04/2015   Procedure: HYSTEROSCOPY with Removal IUD;  Surgeon: Vanessa Kick, MD;  Location: [facility];  Service: Gynecology;  Laterality: N/A;  . lasik     eye surgery  . WISDOM TOOTH EXTRACTION      Family History  Problem Relation Age of Onset  . Heart attack Mother 61  . Cancer Father     prostate  . Heart attack Sister 34  . Hypertension Other     Social History   Social History  . Marital status: Single    Spouse name: N/A  . Number of children: N/A  . Years of education: N/A   Occupational History  . Not on file.   Social History Main Topics  . Smoking status: Former Research scientist (life sciences)  . Smokeless tobacco: Never Used  . Alcohol use Yes     Comment: rare  . Drug use: No  . Sexual activity: Not on file   Other Topics Concern  . Not on file   Social History Narrative  . No narrative on file    Outpatient  Medications Prior to Visit  Medication Sig Dispense Refill  . EPINEPHrine (EPIPEN 2-PAK) 0.3 mg/0.3 mL IJ SOAJ injection Use as directed for severe allergic reaction 2 Device 1  . fexofenadine (ALLEGRA ALLERGY) 180 MG tablet Take 1 tablet (180 mg total) by mouth daily. 301 tablet 1  . fluticasone (FLONASE) 50 MCG/ACT nasal spray Place 2 sprays into both nostrils daily. (Patient taking differently: Place 2 sprays into both nostrils daily as needed for allergies. ) 16 g 11  . ibuprofen (ADVIL,MOTRIN) 600 MG tablet Take 1 tablet (600 mg total) by mouth every 6 (six) hours as needed. 90 tablet 0  . Omega-3 Fatty Acids (FISH OIL PO) Take 1 capsule by mouth daily.    . pantoprazole (PROTONIX) 40 MG tablet Take 1 tablet (40 mg total) by mouth daily. Failed in past, Prevacid, Prilosec, Pepcid, Zantac and Mylanta (Patient taking differently: Take 40 mg by mouth daily as needed (heartburn). ) 90 tablet 2  . TURMERIC PO Take 1 capsule by mouth daily.    [name] amLODipine (NORVASC) 5 MG tablet Take 1 tablet (5 mg total) by mouth 2 (two) times daily. 60 tablet 2  . montelukast (SINGULAIR) 10 MG tablet Take  1 tablet (10 mg total) by mouth at bedtime. 30 tablet 1  . potassium chloride SA (K-DUR,KLOR-CON) 20 MEQ tablet TAKE 2 TABLETS (40 MEQ) BY MOUTH DAILY. (Patient taking differently: Take 20 mEq by mouth 2 (two) times daily. ) 180 tablet 2  . valsartan-hydrochlorothiazide (DIOVAN-HCT) 320-25 MG tablet Take 1 tablet by mouth daily. 90 tablet 1   No facility-administered medications prior to visit.     Allergies  Allergen Reactions  . Penicillins Itching and Swelling    Has patient had a PCN reaction causing immediate rash, facial/tongue/throat swelling, SOB or lightheadedness with hypotension: yes Has patient had a PCN reaction causing severe rash involving mucus membranes or skin necrosis: no Has patient had a PCN reaction that required hospitalization no Has patient had a PCN reaction occurring within the last  10 years: yes If all of the above answers are "NO", then may proceed with Cephalosporin use.     Review of Systems  Constitutional: Positive for malaise/fatigue. Negative for fever.  HENT: Negative for congestion.   Eyes: Negative for blurred vision.  Respiratory: Negative for shortness of breath.   Cardiovascular: Positive for palpitations. Negative for chest pain and leg swelling.  Gastrointestinal: Negative for abdominal pain, blood in stool and nausea.  Genitourinary: Negative for dysuria and frequency.  Musculoskeletal: Positive for back pain, joint pain, myalgias and neck pain. Negative for falls.  Skin: Negative for rash.  Neurological: Negative for dizziness, loss of consciousness and headaches.  Endo/Heme/Allergies: Negative for environmental allergies.  Psychiatric/Behavioral: Negative for depression. The patient is not nervous/anxious.        Objective:    Physical Exam  Constitutional: She is oriented to person, place, and time. She appears well-developed and well-nourished. No distress.  HENT:  Head: Normocephalic and atraumatic.  Nose: Nose normal.  Eyes: Right eye exhibits no discharge. Left eye exhibits no discharge.  Neck: Normal range of motion. Neck supple.  Cardiovascular: Normal rate and regular rhythm.   No murmur heard. Pulmonary/Chest: Effort normal and breath sounds normal.  Abdominal: Soft. Bowel sounds are normal. There is no tenderness.  Musculoskeletal: She exhibits no edema.  Neurological: She is alert and oriented to person, place, and time.  Skin: Skin is warm and dry.  Psychiatric: She has a normal mood and affect.  Nursing note and vitals reviewed.   BP 132/72 (BP Location: Left Arm, Patient Position: Sitting, Cuff Size: Large)   Pulse 98   Temp 98.6 F (37 C) (Oral)   Ht 5\' 10"  (1.778 m)   Wt (!) 325 lb (147.4 kg)   BMI 46.63 kg/m  Wt Readings from Last 3 Encounters:  06/26/16 (!) 325 lb (147.4 kg)  05/17/16 (!) 324 lb (147 kg)    04/13/16 (!) 319 lb (144.7 kg)     Lab Results  Component Value Date   WBC 7.2 03/15/2016   HGB 12.2 03/15/2016   HCT 36.8 03/15/2016   PLT 304.0 03/15/2016   GLUCOSE 114 (H) 04/17/2016   CHOL 130 03/15/2016   TRIG 83.0 03/15/2016   HDL 31.20 (L) 03/15/2016   LDLCALC 82 03/15/2016   ALT 9 04/17/2016   AST 9 04/17/2016   NA 138 04/17/2016   K 3.4 (L) 04/17/2016   CL 102 04/17/2016   CREATININE 0.74 04/17/2016   BUN 9 04/17/2016   CO2 28 04/17/2016   TSH 2.38 03/15/2016   HGBA1C 6.0 03/15/2016   MICROALBUR 0.50 12/15/2012    Lab Results  Component Value Date   TSH  2.38 03/15/2016   Lab Results  Component Value Date   WBC 7.2 03/15/2016   HGB 12.2 03/15/2016   HCT 36.8 03/15/2016   MCV 82.5 03/15/2016   PLT 304.0 03/15/2016   Lab Results  Component Value Date   NA 138 04/17/2016   K 3.4 (L) 04/17/2016   CO2 28 04/17/2016   GLUCOSE 114 (H) 04/17/2016   BUN 9 04/17/2016   CREATININE 0.74 04/17/2016   BILITOT 0.3 04/17/2016   ALKPHOS 78 04/17/2016   AST 9 04/17/2016   ALT 9 04/17/2016   PROT 7.1 04/17/2016   ALBUMIN 3.9 04/17/2016   CALCIUM 9.2 04/17/2016   GFR 107.76 04/17/2016   Lab Results  Component Value Date   CHOL 130 03/15/2016   Lab Results  Component Value Date   HDL 31.20 (L) 03/15/2016   Lab Results  Component Value Date   LDLCALC 82 03/15/2016   Lab Results  Component Value Date   TRIG 83.0 03/15/2016   Lab Results  Component Value Date   CHOLHDL 4 03/15/2016   Lab Results  Component Value Date   HGBA1C 6.0 03/15/2016       Assessment & Plan:   Problem List Items Addressed This Visit    Obesity    Encouraged DASH diet, decrease po intake and increase exercise as tolerated. Needs 7-8 hours of sleep nightly. Avoid trans fats, eat small, frequent meals every 4-5 hours with lean proteins, complex carbs and healthy fats. Minimize simple carbs      Essential hypertension    Well controlled, no changes to meds. Encouraged  heart healthy diet such as the DASH diet and exercise as tolerated.       Relevant Medications   amLODipine (NORVASC) 5 MG tablet   valsartan (DIOVAN) 320 MG tablet   triamterene-hydrochlorothiazide (MAXZIDE-25) 37.5-25 MG tablet   Diabetes mellitus type 2 in obese (HCC) - Primary     minimize simple carbs. Increase exercise as tolerated.       Relevant Medications   valsartan (DIOVAN) 320 MG tablet   Other Relevant Orders   Hemoglobin A1c   Musculoskeletal pain   Dyslipidemia    Encouraged heart healthy diet, increase exercise, avoid trans fats, consider a krill oil cap daily      Relevant Orders   Lipid panel   Gastro-esophageal reflux   Hypokalemia   Relevant Medications   potassium chloride SA (K-DUR,KLOR-CON) 20 MEQ tablet    Other Visit Diagnoses    Allergic state, initial encounter       Relevant Medications   potassium chloride SA (K-DUR,KLOR-CON) 20 MEQ tablet   Benign essential HTN       Relevant Medications   potassium chloride SA (K-DUR,KLOR-CON) 20 MEQ tablet   amLODipine (NORVASC) 5 MG tablet   valsartan (DIOVAN) 320 MG tablet   triamterene-hydrochlorothiazide (MAXZIDE-25) 37.5-25 MG tablet   Other Relevant Orders   TSH   CBC   Comprehensive metabolic panel   Encounter for immunization       Relevant Medications   potassium chloride SA (K-DUR,KLOR-CON) 20 MEQ tablet   montelukast (SINGULAIR) 10 MG tablet   amLODipine (NORVASC) 5 MG tablet   valsartan (DIOVAN) 320 MG tablet   triamterene-hydrochlorothiazide (MAXZIDE-25) 37.5-25 MG tablet   Other Relevant Orders   Flu Vaccine QUAD 36+ mos IM (Completed)      I have discontinued Ms. Heckaman valsartan-hydrochlorothiazide. I have also changed her potassium chloride SA. Additionally, I am having her start on valsartan and triamterene-hydrochlorothiazide. Lastly,  I am having her maintain her fluticasone, pantoprazole, fexofenadine, TURMERIC PO, Omega-3 Fatty Acids (FISH OIL PO), ibuprofen, EPINEPHrine,  montelukast, and amLODipine.  Meds ordered this encounter  Medications  . potassium chloride SA (K-DUR,KLOR-CON) 20 MEQ tablet    Sig: Take  1 tablet three times per day.    Dispense:  270 tablet    Refill:  1  . montelukast (SINGULAIR) 10 MG tablet    Sig: Take 1 tablet (10 mg total) by mouth at bedtime.    Dispense:  90 tablet    Refill:  1  . amLODipine (NORVASC) 5 MG tablet    Sig: Take 1 tablet (5 mg total) by mouth 2 (two) times daily.    Dispense:  180 tablet    Refill:  2    D/C PREVIOUS SCRIPTS FOR THIS MEDICATION  . valsartan (DIOVAN) 320 MG tablet    Sig: Take 1 tablet (320 mg total) by mouth daily.    Dispense:  30 tablet    Refill:  1  . triamterene-hydrochlorothiazide (MAXZIDE-25) 37.5-25 MG tablet    Sig: Take 1 tablet by mouth daily.    Dispense:  30 tablet    Refill:  1     Penni Homans, MD

## 2016-07-01 NOTE — Assessment & Plan Note (Signed)
Well controlled, no changes to meds. Encouraged heart healthy diet such as the DASH diet and exercise as tolerated.  °

## 2016-07-02 ENCOUNTER — Ambulatory Visit (INDEPENDENT_AMBULATORY_CARE_PROVIDER_SITE_OTHER): Payer: BC Managed Care – PPO

## 2016-07-02 DIAGNOSIS — J309 Allergic rhinitis, unspecified: Secondary | ICD-10-CM

## 2016-07-03 ENCOUNTER — Encounter: Payer: BC Managed Care – PPO | Attending: Family Medicine | Admitting: *Deleted

## 2016-07-03 DIAGNOSIS — E669 Obesity, unspecified: Secondary | ICD-10-CM | POA: Insufficient documentation

## 2016-07-03 DIAGNOSIS — E1169 Type 2 diabetes mellitus with other specified complication: Secondary | ICD-10-CM

## 2016-07-03 DIAGNOSIS — E119 Type 2 diabetes mellitus without complications: Secondary | ICD-10-CM | POA: Diagnosis not present

## 2016-07-03 DIAGNOSIS — Z713 Dietary counseling and surveillance: Secondary | ICD-10-CM | POA: Diagnosis present

## 2016-07-03 NOTE — Patient Instructions (Addendum)
Plan:  Aim for 3 Carb Choices per meal (60 grams) +/- 1 either way  Aim for 0-2 Carbs per snack if hungry  Include protein in moderation with your meals and snacks Continue reading food labels for Total Carbohydrate and Fat Grams of foods Continue with your activity level daily and increase intensity as tolerated every 2 weeks Water your flowers in AM instead of getting on the scale Continue checking BG at alternate times per day [place] job! Consider checking BG after exercise to see the benefit

## 2016-07-03 NOTE — Progress Notes (Signed)
Diabetes Self-Management Education  Visit Type:  (P) Follow-up  Appt. Start Time: 1600 Appt. End Time: 1630  07/03/2016  [name], identified by name and date of birth, is a 50 y.o. female with a diagnosis of Diabetes: (P) Type 2.  She is pleased with weight loss of 4 pounds since last visit. States she is proud of her 7-8 thousand steps each day, eating a healthier breakfast each AM, and is no longer eating chips out of the bag, but in specific portions.  ASSESSMENT  Height 5\' 10"  (1.778 m), weight (!) 321 lb (145.6 kg). Body mass index is 46.06 kg/m.       Diabetes Self-Management Education - 07/03/16 2000      Psychosocial Assessment   Patient Belief/Attitude about Diabetes (P)  Motivated to manage diabetes     Complications   How often do you check your blood sugar? (P)  3-4 times / week   Fasting Blood glucose range (mg/dL) (P)  70-129   Postprandial Blood glucose range (mg/dL) (P)  70-129   Number of hypoglycemic episodes per month (P)  --  0     Exercise   Exercise Type (P)  Light (walking / raking leaves)   How many days per week to you exercise? (P)  --  3   How many minutes per day do you exercise? (P)  --  30     Patient Self-Evaluation of Goals - Patient rates self as meeting previously set goals (% of time)   Nutrition (P)  >75%   Physical Activity (P)  50 - 75 %   Monitoring (P)  >75%     Outcomes   Program Status (P)  Completed     Subsequent Visit   Since your last visit, are you checking your blood glucose at least once a day? (P)  Yes     TANITA  BODY COMP RESULTS 07/03/2016  Weight (lbs) 320.8   BMI (kg/m^2) 46.0   Fat Mass (lbs) 160.4   Fat Free Mass (lbs) 160.4   Total Body Water (lbs) 119.4    Learning Objective:  Patient will have a greater understanding of diabetes self-management. Patient education plan is to attend individual and/or group sessions per assessed needs and concerns.   Plan:   Patient Instructions  Plan:   Aim for 3 Carb Choices per meal (60 grams) +/- 1 either way  Aim for 0-2 Carbs per snack if hungry  Include protein in moderation with your meals and snacks Continue reading food labels for Total Carbohydrate and Fat Grams of foods Continue with your activity level daily and increase intensity as tolerated every 2 weeks Water your flowers in AM instead of getting on the scale Continue checking BG at alternate times per day [place] job! Consider checking BG after exercise to see the benefit    Expected Outcomes:  (P) Demonstrated interest in learning. Expect positive outcomes  Education material provided: Tanita Scale report  If problems or questions, patient to contact team via:  Phone and Email  Future DSME appointment: - (P) PRN

## 2016-07-13 ENCOUNTER — Ambulatory Visit
Admission: RE | Admit: 2016-07-13 | Discharge: 2016-07-13 | Disposition: A | Discharge status: Home / Self Care | Payer: BC Managed Care – PPO | Source: Ambulatory Visit | Attending: Obstetrics and Gynecology | Admitting: Obstetrics and Gynecology

## 2016-07-13 DIAGNOSIS — Z1231 Encounter for screening mammogram for malignant neoplasm of breast: Secondary | ICD-10-CM

## 2016-07-18 ENCOUNTER — Ambulatory Visit (INDEPENDENT_AMBULATORY_CARE_PROVIDER_SITE_OTHER): Payer: BC Managed Care – PPO | Admitting: Internal Medicine

## 2016-07-18 ENCOUNTER — Other Ambulatory Visit: Payer: BC Managed Care – PPO

## 2016-07-18 DIAGNOSIS — E1169 Type 2 diabetes mellitus with other specified complication: Secondary | ICD-10-CM

## 2016-07-18 DIAGNOSIS — I10 Essential (primary) hypertension: Secondary | ICD-10-CM

## 2016-07-18 DIAGNOSIS — E785 Hyperlipidemia, unspecified: Secondary | ICD-10-CM

## 2016-07-18 DIAGNOSIS — E669 Obesity, unspecified: Secondary | ICD-10-CM | POA: Diagnosis not present

## 2016-07-18 NOTE — Progress Notes (Addendum)
Pre visit review using our clinic tool,if applicable. No additional management support is needed unless otherwise documented below in the visit note.   Patient in for BP check. BP =128/78 P=95. Per order from Dr. Larose Kells patient to continue current medications and return on scheduled appointment date and sooner if needed.  Kathlene November, MD

## 2016-07-19 LAB — COMPREHENSIVE METABOLIC PANEL
ALBUMIN: 4.2 g/dL (ref 3.5–5.2)
ALT: 12 U/L (ref 0–35)
AST: 11 U/L (ref 0–37)
Alkaline Phosphatase: 72 U/L (ref 39–117)
BUN: 10 mg/dL (ref 6–23)
CALCIUM: 9.2 mg/dL (ref 8.4–10.5)
CHLORIDE: 101 meq/L (ref 96–112)
CO2: 27 meq/L (ref 19–32)
CREATININE: 0.87 mg/dL (ref 0.40–1.20)
GFR: 89.3 mL/min (ref >60.00)
Glucose, Bld: 98 mg/dL (ref 70–99)
POTASSIUM: 3.5 meq/L (ref 3.5–5.1)
Sodium: 137 mEq/L (ref 135–145)
Total Bilirubin: 0.2 mg/dL (ref 0.2–1.2)
Total Protein: 7.5 g/dL (ref 6.0–8.3)

## 2016-07-19 LAB — CBC
HEMATOCRIT: 36.2 % (ref 36.0–46.0)
HEMOGLOBIN: 12.1 g/dL (ref 12.0–15.0)
MCHC: 33.4 g/dL (ref 30.0–36.0)
MCV: 82.7 fl (ref 78.0–100.0)
PLATELETS: 322 10*3/uL (ref 150.0–400.0)
RBC: 4.38 Mil/uL (ref 3.87–5.11)
RDW: 14.6 % (ref 11.5–15.5)
WBC: 7.9 10*3/uL (ref 4.0–10.5)

## 2016-07-19 LAB — HEMOGLOBIN A1C: Hgb A1c MFr Bld: 6.2 % (ref 4.6–6.5)

## 2016-07-19 LAB — LIPID PANEL
CHOL/HDL RATIO: 4
CHOLESTEROL: 141 mg/dL (ref 0–200)
HDL: 36.6 mg/dL — AB (ref >39.00)
LDL CALC: 85 mg/dL (ref 0–99)
NonHDL: 104.03
TRIGLYCERIDES: 97 mg/dL (ref 0.0–149.0)
VLDL: 19.4 mg/dL (ref 0.0–40.0)

## 2016-07-19 LAB — TSH: TSH: 1.48 u[IU]/mL (ref 0.35–4.50)

## 2016-07-20 MED FILL — TRIAMTERENE-HCTZ 37.5-25 MG: 37.5-25 | 30 days supply | Qty: 30 | Fill #1

## 2016-07-20 MED FILL — VALSARTAN 320 MG TABLET: 320 | 30 days supply | Qty: 30 | Fill #1

## 2016-07-23 MED FILL — PANTOPRAZOLE SOD DR 40 MG T: 40 | 90 days supply | Qty: 90 | Fill #2

## 2016-07-31 ENCOUNTER — Ambulatory Visit (INDEPENDENT_AMBULATORY_CARE_PROVIDER_SITE_OTHER): Payer: BC Managed Care – PPO | Admitting: *Deleted

## 2016-07-31 DIAGNOSIS — J309 Allergic rhinitis, unspecified: Secondary | ICD-10-CM | POA: Diagnosis not present

## 2016-08-13 DIAGNOSIS — J301 Allergic rhinitis due to pollen: Secondary | ICD-10-CM | POA: Diagnosis not present

## 2016-08-14 DIAGNOSIS — J302 Other seasonal allergic rhinitis: Secondary | ICD-10-CM | POA: Diagnosis not present

## 2016-08-15 DIAGNOSIS — J3089 Other allergic rhinitis: Secondary | ICD-10-CM | POA: Diagnosis not present

## 2016-08-31 ENCOUNTER — Ambulatory Visit (INDEPENDENT_AMBULATORY_CARE_PROVIDER_SITE_OTHER): Payer: BC Managed Care – PPO

## 2016-08-31 DIAGNOSIS — J309 Allergic rhinitis, unspecified: Secondary | ICD-10-CM | POA: Diagnosis not present

## 2016-09-17 ENCOUNTER — Encounter: Payer: BC Managed Care – PPO | Admitting: Family Medicine

## 2016-09-21 ENCOUNTER — Encounter: Payer: BC Managed Care – PPO | Admitting: Family Medicine

## 2016-10-01 MED FILL — AMLODIPINE BESYLATE 10 MG T: 10 | 90 days supply | Qty: 90 | Fill #1

## 2016-10-01 MED FILL — POTASSIUM CL ER 20 MEQ TABL: 20 | 90 days supply | Qty: 270 | Fill #1

## 2016-10-03 ENCOUNTER — Ambulatory Visit (INDEPENDENT_AMBULATORY_CARE_PROVIDER_SITE_OTHER): Payer: BC Managed Care – PPO

## 2016-10-03 DIAGNOSIS — J309 Allergic rhinitis, unspecified: Secondary | ICD-10-CM | POA: Diagnosis not present

## 2016-10-10 NOTE — Addendum Note (Signed)
Addended by: Orpah Greek D on: 10/10/2016 02:24 PM   Modules accepted: Orders

## 2016-11-01 ENCOUNTER — Ambulatory Visit (INDEPENDENT_AMBULATORY_CARE_PROVIDER_SITE_OTHER): Payer: BC Managed Care – PPO | Admitting: *Deleted

## 2016-11-01 DIAGNOSIS — J309 Allergic rhinitis, unspecified: Secondary | ICD-10-CM

## 2016-11-06 ENCOUNTER — Encounter: Payer: Self-pay | Admitting: Family Medicine

## 2016-11-06 ENCOUNTER — Other Ambulatory Visit: Payer: Self-pay | Admitting: Family Medicine

## 2016-11-06 ENCOUNTER — Ambulatory Visit (INDEPENDENT_AMBULATORY_CARE_PROVIDER_SITE_OTHER): Payer: BC Managed Care – PPO | Admitting: Family Medicine

## 2016-11-06 VITALS — BP 126/70 | HR 94 | Temp 98.4°F | Ht 70.0 in | Wt 328.0 lb

## 2016-11-06 DIAGNOSIS — I10 Essential (primary) hypertension: Secondary | ICD-10-CM

## 2016-11-06 DIAGNOSIS — J302 Other seasonal allergic rhinitis: Secondary | ICD-10-CM

## 2016-11-06 DIAGNOSIS — Z0000 Encounter for general adult medical examination without abnormal findings: Secondary | ICD-10-CM | POA: Diagnosis not present

## 2016-11-06 DIAGNOSIS — E6609 Other obesity due to excess calories: Secondary | ICD-10-CM | POA: Diagnosis not present

## 2016-11-06 DIAGNOSIS — E1169 Type 2 diabetes mellitus with other specified complication: Secondary | ICD-10-CM

## 2016-11-06 DIAGNOSIS — E669 Obesity, unspecified: Secondary | ICD-10-CM | POA: Diagnosis not present

## 2016-11-06 MED ORDER — AZELASTINE HCL 0.1 % NA SOLN: 2.0000 | Freq: Every day | NASAL | Status: DC

## 2016-11-06 MED ORDER — VALSARTAN-HYDROCHLOROTHIAZIDE 320-25 MG PO TABS: 1.0000 | ORAL_TABLET | Freq: Every day | ORAL | 1 refills | Status: DC

## 2016-11-06 MED ORDER — AZELASTINE HCL 0.1 % NA SOLN: 2.0000 | Freq: Two times a day (BID) | NASAL | 1 refills | Status: DC

## 2016-11-06 MED FILL — VALSARTAN-HCTZ 320-25 MG TA: 320-25 | 90 days supply | Qty: 90 | Fill #0

## 2016-11-06 MED FILL — AZELASTINE 0.1% (137 MCG) S: 0.1 | 30 days supply | Qty: 30 | Fill #0

## 2016-11-06 NOTE — Progress Notes (Signed)
Pre visit review using our clinic review tool, if applicable. No additional management support is needed unless otherwise documented below in the visit note. 

## 2016-11-06 NOTE — Assessment & Plan Note (Signed)
Encouraged DASH diet, decrease po intake and increase exercise as tolerated. Needs 7-8 hours of sleep nightly. Avoid trans fats, eat small, frequent meals every 4-5 hours with lean proteins, complex carbs and healthy fats. Minimize simple carbs. Given paperwork for bariatric program

## 2016-11-06 NOTE — Progress Notes (Signed)
Patient ID: Brandi Lester, female   DOB: [birth year], 51 y.o.   MRN: [id number]   Subjective:  I acted as a Education administrator for Penni Homans, [address], [place]   Patient ID: Brandi Lester, female    DOB: [birth year], 51 y.o.   MRN: [id number]  Chief Complaint  Patient presents with  . Annual Exam  . Diabetes  . Hypertension    Diabetes  She presents for her follow-up diabetic visit. She has type 2 diabetes mellitus. Pertinent negatives for hypoglycemia include no headaches. Pertinent negatives for diabetes include no blurred vision and no chest pain. Risk factors for coronary artery disease include hypertension, obesity and diabetes mellitus.  Hypertension  This is a chronic problem. The problem is controlled. Associated symptoms include malaise/fatigue. Pertinent negatives include no blurred vision, chest pain, headaches, palpitations or shortness of breath. Risk factors for coronary artery disease include obesity and diabetes mellitus. The current treatment provides mild improvement.    Patient is in today for an annual examination. Patient has a Hx of HTN, Type II Dm, insomnia. Patient has no acute concerns at noted at this time. She continues to work as a Pharmacist, hospital. Does endorse fatigue. She also endorses some allergic symptoms this week with watery eyes, nasal congestion, sinus pressure. She does some infrequent abdominal discomfort as well. Denies CP/palp/SOB/HA/congestion/fever or GU c/o. Taking meds as prescribed  Patient Care Team: Mosie Lukes, MD as PCP - General (Family Medicine) Jiles Prows, MD as Consulting Physician (Allergy and Immunology) Gus Height, MD as Consulting Physician (Obstetrics and Gynecology) Rutherford Guys, MD as Consulting Physician (Ophthalmology) [facility] as Consulting Physician (Hand Surgery) Leanora Cover, MD as Consulting Physician (Orthopedic Surgery)   Past Medical History:  Diagnosis Date  . Allergy    allergic rhinitis  . Anemia  07/25/2013  . Costochondritis 02/20/2015  . Diabetes mellitus    Type II   previously 3 years ago - no longer on meds   . Dyslipidemia 07/25/2013  . Fungus infection 10/13   Left great toe  . GERD (gastroesophageal reflux disease)   . Hypertension   . Ingrown nail 10/13   right foot next to the last toe  . Insomnia 04/20/2013  . Obesity   . Pedal edema 02/20/2015  . Preventative health care 09/25/2015    Past Surgical History:  Procedure Laterality Date  . HYSTEROSCOPY N/A 10/04/2015   Procedure: HYSTEROSCOPY with Removal IUD;  Surgeon: Vanessa Kick, MD;  Location: [facility];  Service: Gynecology;  Laterality: N/A;  . lasik     eye surgery  . WISDOM TOOTH EXTRACTION      Family History  Problem Relation Age of Onset  . Heart attack Mother 57  . Cancer Father     prostate  . Heart attack Sister 51  . Hypertension Other     Social History   Social History  . Marital status: Single    Spouse name: N/A  . Number of children: N/A  . Years of education: N/A   Occupational History  . Not on file.   Social History Main Topics  . Smoking status: Former Research scientist (life sciences)  . Smokeless tobacco: Never Used  . Alcohol use Yes     Comment: rare  . Drug use: No  . Sexual activity: Not on file   Other Topics Concern  . Not on file   Social History Narrative  . No narrative on file    Outpatient Medications Prior to Visit  Medication  Sig Dispense Refill  . amLODipine (NORVASC) 5 MG tablet Take 1 tablet (5 mg total) by mouth 2 (two) times daily. 180 tablet 2  . EPINEPHrine (EPIPEN 2-PAK) 0.3 mg/0.3 mL IJ SOAJ injection Use as directed for severe allergic reaction 2 Device 1  . fexofenadine (ALLEGRA ALLERGY) 180 MG tablet Take 1 tablet (180 mg total) by mouth daily. 301 tablet 1  . fluticasone (FLONASE) 50 MCG/ACT nasal spray Place 2 sprays into both nostrils daily. (Patient taking differently: Place 2 sprays into both nostrils daily as needed for allergies. ) 16 g 11  . ibuprofen  (ADVIL,MOTRIN) 600 MG tablet Take 1 tablet (600 mg total) by mouth every 6 (six) hours as needed. 90 tablet 0  . montelukast (SINGULAIR) 10 MG tablet Take 1 tablet (10 mg total) by mouth at bedtime. 90 tablet 1  . Omega-3 Fatty Acids (FISH OIL PO) Take 1 capsule by mouth daily.    . pantoprazole (PROTONIX) 40 MG tablet Take 1 tablet (40 mg total) by mouth daily. Failed in past, Prevacid, Prilosec, Pepcid, Zantac and Mylanta (Patient taking differently: Take 40 mg by mouth daily as needed (heartburn). ) 90 tablet 2  . potassium chloride SA (K-DUR,KLOR-CON) 20 MEQ tablet Take  1 tablet three times per day. 270 tablet 1  . TURMERIC PO Take 1 capsule by mouth daily.    . valsartan (DIOVAN) 320 MG tablet Take 1 tablet (320 mg total) by mouth daily. 30 tablet 1  . triamterene-hydrochlorothiazide (MAXZIDE-25) 37.5-25 MG tablet Take 1 tablet by mouth daily. (Patient not taking: Reported on 11/06/2016) 30 tablet 1   No facility-administered medications prior to visit.     Allergies  Allergen Reactions  . Penicillins Itching and Swelling    Has patient had a PCN reaction causing immediate rash, facial/tongue/throat swelling, SOB or lightheadedness with hypotension: yes Has patient had a PCN reaction causing severe rash involving mucus membranes or skin necrosis: no Has patient had a PCN reaction that required hospitalization no Has patient had a PCN reaction occurring within the last 10 years: yes If all of the above answers are "NO", then may proceed with Cephalosporin use.     Review of Systems  Constitutional: Positive for malaise/fatigue. Negative for fever.  HENT: Positive for congestion and sinus pain.   Eyes: Positive for discharge. Negative for blurred vision.  Respiratory: Negative for cough and shortness of breath.   Cardiovascular: Negative for chest pain, palpitations and leg swelling.  Gastrointestinal: Positive for abdominal pain. Negative for vomiting.  Musculoskeletal: Negative for  back pain.  Skin: Negative for rash.  Neurological: Negative for loss of consciousness and headaches.       Objective:    Physical Exam  Constitutional: She is oriented to person, place, and time. She appears well-developed and well-nourished. No distress.  HENT:  Head: Normocephalic and atraumatic.  Eyes: Conjunctivae are normal.  Neck: Normal range of motion. No thyromegaly present.  Cardiovascular: Normal rate and regular rhythm.   Pulmonary/Chest: Effort normal and breath sounds normal. She has no wheezes.  Abdominal: Soft. Bowel sounds are normal. There is no tenderness.  Musculoskeletal: She exhibits no edema or deformity.  Lymphadenopathy:    She has no cervical adenopathy.  Neurological: She is alert and oriented to person, place, and time. She displays normal reflexes.  Skin: Skin is warm and dry. She is not diaphoretic.  Psychiatric: She has a normal mood and affect.    BP 126/70 (BP Location: Right Wrist, Patient Position: Sitting, Cuff  Size: Normal)   Pulse 94   Temp 98.4 F (36.9 C) (Oral)   Ht 5\' 10"  (1.778 m)   Wt (!) 328 lb (148.8 kg)   SpO2 97% Comment: RA  BMI 47.06 kg/m  Wt Readings from Last 3 Encounters:  11/06/16 (!) 328 lb (148.8 kg)  07/03/16 (!) 321 lb (145.6 kg)  06/26/16 (!) 325 lb (147.4 kg)     Lab Results  Component Value Date   WBC 7.6 11/06/2016   HGB 12.2 11/06/2016   HCT 36.5 11/06/2016   PLT 318.0 11/06/2016   GLUCOSE 82 11/06/2016   CHOL 141 11/06/2016   TRIG 99.0 11/06/2016   HDL 31.80 (L) 11/06/2016   LDLCALC 90 11/06/2016   ALT 9 11/06/2016   AST 11 11/06/2016   NA 138 11/06/2016   K 3.6 11/06/2016   K 3.6 11/06/2016   CL 101 11/06/2016   CREATININE 0.77 11/06/2016   BUN 9 11/06/2016   CO2 30 11/06/2016   TSH 1.68 11/06/2016   HGBA1C 6.3 11/06/2016   MICROALBUR 0.50 12/15/2012    Lab Results  Component Value Date   TSH 1.68 11/06/2016   Lab Results  Component Value Date   WBC 7.6 11/06/2016   HGB 12.2  11/06/2016   HCT 36.5 11/06/2016   MCV 83.4 11/06/2016   PLT 318.0 11/06/2016   Lab Results  Component Value Date   NA 138 11/06/2016   K 3.6 11/06/2016   K 3.6 11/06/2016   CO2 30 11/06/2016   GLUCOSE 82 11/06/2016   BUN 9 11/06/2016   CREATININE 0.77 11/06/2016   BILITOT 0.3 11/06/2016   ALKPHOS 69 11/06/2016   AST 11 11/06/2016   ALT 9 11/06/2016   PROT 7.3 11/06/2016   ALBUMIN 3.9 11/06/2016   CALCIUM 9.1 11/06/2016   GFR 102.69 11/06/2016   Lab Results  Component Value Date   CHOL 141 11/06/2016   Lab Results  Component Value Date   HDL 31.80 (L) 11/06/2016   Lab Results  Component Value Date   LDLCALC 90 11/06/2016   Lab Results  Component Value Date   TRIG 99.0 11/06/2016   Lab Results  Component Value Date   CHOLHDL 4 11/06/2016   Lab Results  Component Value Date   HGBA1C 6.3 11/06/2016       Assessment & Plan:   Problem List Items Addressed This Visit    Obesity    Encouraged DASH diet, decrease po intake and increase exercise as tolerated. Needs 7-8 hours of sleep nightly. Avoid trans fats, eat small, frequent meals every 4-5 hours with lean proteins, complex carbs and healthy fats. Minimize simple carbs. Given paperwork for bariatric program       Allergic rhinitis    Continue Flonase, Singulair, nasal saline prn, add Astelin and can increase Zyrtec to bid      Relevant Medications   azelastine (ASTELIN) 0.1 % nasal spray 2 spray   Diabetes mellitus type 2 in obese (HCC)    hgba1c acceptable, minimize simple carbs. Increase exercise as tolerated. Check hgba1c today      Relevant Medications   valsartan-hydrochlorothiazide (DIOVAN-HCT) 320-25 MG tablet   Other Relevant Orders   Hemoglobin A1c (Completed)   Preventative health care    Patient encouraged to maintain heart healthy diet, regular exercise, adequate sleep. Consider daily probiotics. Take medications as prescribed      Relevant Orders   Hemoglobin A1c (Completed)   CBC  (Completed)   Lipid panel (Completed)  TSH (Completed)   Magnesium (Completed)   Potassium (Completed)    Other Visit Diagnoses    Hypertension, unspecified type    -  Primary   Relevant Medications   valsartan-hydrochlorothiazide (DIOVAN-HCT) 320-25 MG tablet   Other Relevant Orders   CBC (Completed)   TSH (Completed)   Comprehensive metabolic panel (Completed)      I have discontinued Ms. Steinmeyer triamterene-hydrochlorothiazide. I am also having her maintain her fluticasone, pantoprazole, fexofenadine, TURMERIC PO, Omega-3 Fatty Acids (FISH OIL PO), ibuprofen, EPINEPHrine, potassium chloride SA, montelukast, amLODipine, valsartan, and valsartan-hydrochlorothiazide. We will continue to administer azelastine.  Meds ordered this encounter  Medications  . DISCONTD: valsartan-hydrochlorothiazide (DIOVAN-HCT) 320-25 MG tablet    Sig: Take 1 tablet by mouth daily.  . valsartan-hydrochlorothiazide (DIOVAN-HCT) 320-25 MG tablet    Sig: Take 1 tablet by mouth daily.    Dispense:  90 tablet    Refill:  1  . azelastine (ASTELIN) 0.1 % nasal spray 2 spray    CMA served as scribe during this visit. History, Physical and Plan performed by medical provider. Documentation and orders reviewed and attested to.  Penni Homans, MD

## 2016-11-06 NOTE — Assessment & Plan Note (Signed)
hgba1c acceptable, minimize simple carbs. Increase exercise as tolerated. Check hgba1c today 

## 2016-11-06 NOTE — Assessment & Plan Note (Signed)
Patient encouraged to maintain heart healthy diet, regular exercise, adequate sleep. Consider daily probiotics. Take medications as prescribed 

## 2016-11-06 NOTE — Assessment & Plan Note (Signed)
Continue Flonase, Singulair, nasal saline prn, add Astelin and can increase Zyrtec to bid

## 2016-11-06 NOTE — Patient Instructions (Addendum)
Spoke with today in regards to [place]. Encouraged you to bring in a notarized copy of your Healthcare Power of Attorney and Living Will into Dr. Frederik Pear Office to be scanned into your Chart. Encouraged to incorporate the NOW Probiotic into your daily regimen. Encouraged to soak feet nightly in hot water and distilled vinegar for about 15 minutes. And can apply Lamisil or vick's vapor rubto feet after showers every night. Preventive Care 40-64 Years, Female Preventive care refers to lifestyle choices and visits with your health care provider that can promote health and wellness. What does preventive care include?  A yearly physical exam. This is also called an annual well check.  Dental exams once or twice a year.  Routine eye exams. Ask your health care provider how often you should have your eyes checked.  Personal lifestyle choices, including:  Daily care of your teeth and gums.  Regular physical activity.  Eating a healthy diet.  Avoiding tobacco and drug use.  Limiting alcohol use.  Practicing safe sex.  Taking low-dose aspirin daily starting at age 31.  Taking vitamin and mineral supplements as recommended by your health care provider. What happens during an annual well check? The services and screenings done by your health care provider during your annual well check will depend on your age, overall health, lifestyle risk factors, and family history of disease. Counseling  Your health care provider may ask you questions about your:  Alcohol use.  Tobacco use.  Drug use.  Emotional well-being.  Home and relationship well-being.  Sexual activity.  Eating habits.  Work and work Statistician.  Method of birth control.  Menstrual cycle.  Pregnancy history. Screening  You may have the following tests or measurements:  Height, weight, and BMI.  Blood pressure.  Lipid and cholesterol levels. These may be checked every 5 years, or more frequently  if you are over 51 years old.  Skin check.  Lung cancer screening. You may have this screening every year starting at age 66 if you have a 30-pack-year history of smoking and currently smoke or have quit within the past 15 years.  Fecal occult blood test (FOBT) of the stool. You may have this test every year starting at age 97.  Flexible sigmoidoscopy or colonoscopy. You may have a sigmoidoscopy every 5 years or a colonoscopy every 10 years starting at age 55.  Hepatitis C blood test.  Hepatitis B blood test.  Sexually transmitted disease (STD) testing.  Diabetes screening. This is done by checking your blood sugar (glucose) after you have not eaten for a while (fasting). You may have this done every 1-3 years.  Mammogram. This may be done every 1-2 years. Talk to your health care provider about when you should start having regular mammograms. This may depend on whether you have a family history of breast cancer.  BRCA-related cancer screening. This may be done if you have a family history of breast, ovarian, tubal, or peritoneal cancers.  Pelvic exam and Pap test. This may be done every 3 years starting at age 67. Starting at age 66, this may be done every 5 years if you have a Pap test in combination with an HPV test.  Bone density scan. This is done to screen for osteoporosis. You may have this scan if you are at high risk for osteoporosis. Discuss your test results, treatment options, and if necessary, the need for more tests with your health care provider. Vaccines  Your health care provider may recommend  certain vaccines, such as:  Influenza vaccine. This is recommended every year.  Tetanus, diphtheria, and acellular pertussis (Tdap, Td) vaccine. You may need a Td booster every 10 years.  Varicella vaccine. You may need this if you have not been vaccinated.  Zoster vaccine. You may need this after age 70.  Measles, mumps, and rubella (MMR) vaccine. You may need at least one  dose of MMR if you were born in 1957 or later. You may also need a second dose.  Pneumococcal 13-valent conjugate (PCV13) vaccine. You may need this if you have certain conditions and were not previously vaccinated.  Pneumococcal polysaccharide (PPSV23) vaccine. You may need one or two doses if you smoke cigarettes or if you have certain conditions.  Meningococcal vaccine. You may need this if you have certain conditions.  Hepatitis A vaccine. You may need this if you have certain conditions or if you travel or work in places where you may be exposed to hepatitis A.  Hepatitis B vaccine. You may need this if you have certain conditions or if you travel or work in places where you may be exposed to hepatitis B.  Haemophilus influenzae type b (Hib) vaccine. You may need this if you have certain conditions. Talk to your health care provider about which screenings and vaccines you need and how often you need them. This information is not intended to replace advice given to you by your health care provider. Make sure you discuss any questions you have with your health care provider. Document Released: 09/16/2015 Document Revised: 05/09/2016 Document Reviewed: 06/21/2015 Elsevier Interactive Patient Education  2017 [employer].

## 2016-11-07 ENCOUNTER — Encounter: Payer: Self-pay | Admitting: Family Medicine

## 2016-11-07 LAB — LIPID PANEL
CHOLESTEROL: 141 mg/dL (ref 0–200)
HDL: 31.8 mg/dL — ABNORMAL LOW (ref >39.00)
LDL Cholesterol: 90 mg/dL (ref 0–99)
NonHDL: 109.69
Total CHOL/HDL Ratio: 4
Triglycerides: 99 mg/dL (ref 0.0–149.0)
VLDL: 19.8 mg/dL (ref 0.0–40.0)

## 2016-11-07 LAB — COMPREHENSIVE METABOLIC PANEL
ALK PHOS: 69 U/L (ref 39–117)
ALT: 9 U/L (ref 0–35)
AST: 11 U/L (ref 0–37)
Albumin: 3.9 g/dL (ref 3.5–5.2)
BILIRUBIN TOTAL: 0.3 mg/dL (ref 0.2–1.2)
BUN: 9 mg/dL (ref 6–23)
CO2: 30 mEq/L (ref 19–32)
CREATININE: 0.77 mg/dL (ref 0.40–1.20)
Calcium: 9.1 mg/dL (ref 8.4–10.5)
Chloride: 101 mEq/L (ref 96–112)
GFR: 102.69 mL/min (ref >60.00)
GLUCOSE: 82 mg/dL (ref 70–99)
POTASSIUM: 3.6 meq/L (ref 3.5–5.1)
SODIUM: 138 meq/L (ref 135–145)
TOTAL PROTEIN: 7.3 g/dL (ref 6.0–8.3)

## 2016-11-07 LAB — CBC
HEMATOCRIT: 36.5 % (ref 36.0–46.0)
Hemoglobin: 12.2 g/dL (ref 12.0–15.0)
MCHC: 33.4 g/dL (ref 30.0–36.0)
MCV: 83.4 fl (ref 78.0–100.0)
Platelets: 318 10*3/uL (ref 150.0–400.0)
RBC: 4.38 Mil/uL (ref 3.87–5.11)
RDW: 14.2 % (ref 11.5–15.5)
WBC: 7.6 10*3/uL (ref 4.0–10.5)

## 2016-11-07 LAB — HEMOGLOBIN A1C: Hgb A1c MFr Bld: 6.3 % (ref 4.6–6.5)

## 2016-11-07 LAB — POTASSIUM: Potassium: 3.6 mEq/L (ref 3.5–5.1)

## 2016-11-07 LAB — TSH: TSH: 1.68 u[IU]/mL (ref 0.35–4.50)

## 2016-11-07 LAB — MAGNESIUM: Magnesium: 2 mg/dL (ref 1.5–2.5)

## 2016-12-04 ENCOUNTER — Ambulatory Visit (INDEPENDENT_AMBULATORY_CARE_PROVIDER_SITE_OTHER): Payer: BC Managed Care – PPO | Admitting: *Deleted

## 2016-12-04 DIAGNOSIS — J309 Allergic rhinitis, unspecified: Secondary | ICD-10-CM | POA: Diagnosis not present

## 2016-12-11 ENCOUNTER — Ambulatory Visit (INDEPENDENT_AMBULATORY_CARE_PROVIDER_SITE_OTHER): Payer: BC Managed Care – PPO | Admitting: *Deleted

## 2016-12-11 DIAGNOSIS — J309 Allergic rhinitis, unspecified: Secondary | ICD-10-CM

## 2016-12-19 ENCOUNTER — Ambulatory Visit (INDEPENDENT_AMBULATORY_CARE_PROVIDER_SITE_OTHER): Payer: BC Managed Care – PPO

## 2016-12-19 ENCOUNTER — Encounter: Payer: Self-pay | Admitting: Family Medicine

## 2016-12-19 DIAGNOSIS — J309 Allergic rhinitis, unspecified: Secondary | ICD-10-CM | POA: Diagnosis not present

## 2017-01-01 ENCOUNTER — Ambulatory Visit (INDEPENDENT_AMBULATORY_CARE_PROVIDER_SITE_OTHER): Payer: BC Managed Care – PPO | Admitting: *Deleted

## 2017-01-01 ENCOUNTER — Other Ambulatory Visit: Payer: Self-pay | Admitting: Family Medicine

## 2017-01-01 DIAGNOSIS — T7840XA Allergy, unspecified, initial encounter: Secondary | ICD-10-CM

## 2017-01-01 DIAGNOSIS — J309 Allergic rhinitis, unspecified: Secondary | ICD-10-CM

## 2017-01-01 DIAGNOSIS — K219 Gastro-esophageal reflux disease without esophagitis: Secondary | ICD-10-CM

## 2017-01-01 DIAGNOSIS — E876 Hypokalemia: Secondary | ICD-10-CM

## 2017-01-01 DIAGNOSIS — I10 Essential (primary) hypertension: Secondary | ICD-10-CM

## 2017-01-01 MED FILL — POTASSIUM CL ER 20 MEQ TABL: 20 | 90 days supply | Qty: 270 | Fill #0

## 2017-01-01 MED FILL — PANTOPRAZOLE SOD DR 40 MG T: 40 | 90 days supply | Qty: 90 | Fill #0

## 2017-01-01 MED FILL — AMLODIPINE BESYLATE 10 MG T: 10 | 90 days supply | Qty: 90 | Fill #2

## 2017-01-10 ENCOUNTER — Ambulatory Visit (INDEPENDENT_AMBULATORY_CARE_PROVIDER_SITE_OTHER): Payer: BC Managed Care – PPO | Admitting: *Deleted

## 2017-01-10 DIAGNOSIS — J309 Allergic rhinitis, unspecified: Secondary | ICD-10-CM

## 2017-01-30 MED FILL — VALSARTAN-HCTZ 320-25 MG TA: 320-25 | 90 days supply | Qty: 90 | Fill #1

## 2017-02-12 ENCOUNTER — Ambulatory Visit (INDEPENDENT_AMBULATORY_CARE_PROVIDER_SITE_OTHER): Payer: BC Managed Care – PPO | Admitting: *Deleted

## 2017-02-12 DIAGNOSIS — J309 Allergic rhinitis, unspecified: Secondary | ICD-10-CM

## 2017-03-14 ENCOUNTER — Ambulatory Visit (INDEPENDENT_AMBULATORY_CARE_PROVIDER_SITE_OTHER): Payer: BC Managed Care – PPO | Admitting: *Deleted

## 2017-03-14 DIAGNOSIS — J309 Allergic rhinitis, unspecified: Secondary | ICD-10-CM | POA: Diagnosis not present

## 2017-03-15 ENCOUNTER — Ambulatory Visit (INDEPENDENT_AMBULATORY_CARE_PROVIDER_SITE_OTHER): Payer: BC Managed Care – PPO | Admitting: Family Medicine

## 2017-03-15 ENCOUNTER — Encounter: Payer: Self-pay | Admitting: Family Medicine

## 2017-03-15 ENCOUNTER — Other Ambulatory Visit: Payer: Self-pay | Admitting: Family Medicine

## 2017-03-15 DIAGNOSIS — E785 Hyperlipidemia, unspecified: Secondary | ICD-10-CM

## 2017-03-15 DIAGNOSIS — T7840XA Allergy, unspecified, initial encounter: Secondary | ICD-10-CM

## 2017-03-15 DIAGNOSIS — R600 Localized edema: Secondary | ICD-10-CM

## 2017-03-15 DIAGNOSIS — E876 Hypokalemia: Secondary | ICD-10-CM

## 2017-03-15 DIAGNOSIS — D649 Anemia, unspecified: Secondary | ICD-10-CM | POA: Diagnosis not present

## 2017-03-15 DIAGNOSIS — E1169 Type 2 diabetes mellitus with other specified complication: Secondary | ICD-10-CM | POA: Diagnosis not present

## 2017-03-15 DIAGNOSIS — I10 Essential (primary) hypertension: Secondary | ICD-10-CM

## 2017-03-15 DIAGNOSIS — E6609 Other obesity due to excess calories: Secondary | ICD-10-CM

## 2017-03-15 DIAGNOSIS — E669 Obesity, unspecified: Secondary | ICD-10-CM | POA: Diagnosis not present

## 2017-03-15 LAB — COMPREHENSIVE METABOLIC PANEL
ALT: 9 U/L (ref 0–35)
AST: 11 U/L (ref 0–37)
Albumin: 4 g/dL (ref 3.5–5.2)
Alkaline Phosphatase: 71 U/L (ref 39–117)
BUN: 8 mg/dL (ref 6–23)
CALCIUM: 9.4 mg/dL (ref 8.4–10.5)
CO2: 27 mEq/L (ref 19–32)
CREATININE: 0.76 mg/dL (ref 0.40–1.20)
Chloride: 101 mEq/L (ref 96–112)
GFR: 104.09 mL/min (ref >60.00)
Glucose, Bld: 107 mg/dL — ABNORMAL HIGH (ref 70–99)
Potassium: 3.1 mEq/L — ABNORMAL LOW (ref 3.5–5.1)
Sodium: 137 mEq/L (ref 135–145)
Total Bilirubin: 0.3 mg/dL (ref 0.2–1.2)
Total Protein: 7.4 g/dL (ref 6.0–8.3)

## 2017-03-15 LAB — CBC
HCT: 36.8 % (ref 36.0–46.0)
Hemoglobin: 12.5 g/dL (ref 12.0–15.0)
MCHC: 34 g/dL (ref 30.0–36.0)
MCV: 82 fl (ref 78.0–100.0)
PLATELETS: 273 10*3/uL (ref 150.0–400.0)
RBC: 4.49 Mil/uL (ref 3.87–5.11)
RDW: 14.4 % (ref 11.5–15.5)
WBC: 6.7 10*3/uL (ref 4.0–10.5)

## 2017-03-15 LAB — LIPID PANEL
CHOL/HDL RATIO: 4
Cholesterol: 149 mg/dL (ref 0–200)
HDL: 33.7 mg/dL — AB (ref >39.00)
LDL CALC: 89 mg/dL (ref 0–99)
NonHDL: 115.78
TRIGLYCERIDES: 133 mg/dL (ref 0.0–149.0)
VLDL: 26.6 mg/dL (ref 0.0–40.0)

## 2017-03-15 LAB — TSH: TSH: 1.26 u[IU]/mL (ref 0.35–4.50)

## 2017-03-15 LAB — HEMOGLOBIN A1C: Hgb A1c MFr Bld: 6.6 % — ABNORMAL HIGH (ref 4.6–6.5)

## 2017-03-15 MED ORDER — PANTOPRAZOLE SODIUM 40 MG PO TBEC: 40.0000 mg | DELAYED_RELEASE_TABLET | Freq: Every day | ORAL | 1 refills | Status: DC

## 2017-03-15 MED ORDER — ASPIRIN EC 81 MG PO TBEC: 81.0000 mg | DELAYED_RELEASE_TABLET | Freq: Every day | ORAL | Status: DC

## 2017-03-15 MED ORDER — AMLODIPINE BESYLATE 5 MG PO TABS: 5.0000 mg | ORAL_TABLET | Freq: Two times a day (BID) | ORAL | 1 refills | Status: DC

## 2017-03-15 MED ORDER — AZELASTINE HCL 0.1 % NA SOLN: 2.0000 | Freq: Two times a day (BID) | NASAL | 1 refills | Status: DC

## 2017-03-15 MED ORDER — FLUTICASONE PROPIONATE 50 MCG/ACT NA SUSP: 2.0000 | Freq: Every day | NASAL | 1 refills | Status: DC

## 2017-03-15 MED ORDER — POTASSIUM CHLORIDE CRYS ER 20 MEQ PO TBCR: 20.0000 meq | EXTENDED_RELEASE_TABLET | Freq: Four times a day (QID) | ORAL | 3 refills | Status: DC

## 2017-03-15 MED ORDER — POTASSIUM CHLORIDE CRYS ER 20 MEQ PO TBCR: 20.0000 meq | EXTENDED_RELEASE_TABLET | Freq: Three times a day (TID) | ORAL | 1 refills | Status: DC

## 2017-03-15 MED ORDER — MONTELUKAST SODIUM 10 MG PO TABS: 10.0000 mg | ORAL_TABLET | Freq: Every day | ORAL | 1 refills | Status: DC

## 2017-03-15 MED ORDER — LOSARTAN POTASSIUM-HCTZ 100-25 MG PO TABS: 1.0000 | ORAL_TABLET | Freq: Every day | ORAL | 1 refills | Status: DC

## 2017-03-15 MED FILL — AZELASTINE 0.1% (137 MCG) S: 0.1 | 90 days supply | Qty: 90 | Fill #0

## 2017-03-15 MED FILL — AMLODIPINE BESYLATE 10 MG T: 10 | 90 days supply | Qty: 90 | Fill #0

## 2017-03-15 MED FILL — LOSARTAN-HCTZ 100-25 MG TAB: 100-25 | 90 days supply | Qty: 90 | Fill #0

## 2017-03-15 MED FILL — POTASSIUM CL ER 20 MEQ TABL: 20 | 90 days supply | Qty: 270 | Fill #0

## 2017-03-15 MED FILL — PANTOPRAZOLE SOD DR 40 MG T: 40 | 90 days supply | Qty: 90 | Fill #0

## 2017-03-15 MED FILL — MONTELUKAST SOD 10 MG TAB: 10 | 90 days supply | Qty: 90 | Fill #0

## 2017-03-15 MED FILL — FLUTICASONE PROP 50 MCG SPR: 50 | 90 days supply | Qty: 48 | Fill #0

## 2017-03-15 NOTE — Patient Instructions (Signed)

## 2017-03-15 NOTE — Assessment & Plan Note (Signed)
Check cbc 

## 2017-03-15 NOTE — Assessment & Plan Note (Signed)
Encouraged heart healthy diet, increase exercise, avoid trans fats, consider a krill oil cap daily 

## 2017-03-15 NOTE — Assessment & Plan Note (Addendum)
Well controlled, no changes to meds. Encouraged heart healthy diet such as the DASH diet and exercise as tolerated. She is worried about the recent literature regarding Valsartan so we will switch her to Losartanhct and check bp and pulse in one month

## 2017-03-15 NOTE — Assessment & Plan Note (Addendum)
hgba1c acceptable, minimize simple carbs. Increase exercise as tolerated. Continue current meds but with hgba1c increasing to 6.6 will start Metformin 500 mg daily.

## 2017-03-15 NOTE — Assessment & Plan Note (Signed)
Encouraged DASH diet, decrease po intake and increase exercise as tolerated. Needs 7-8 hours of sleep nightly. Avoid trans fats, eat small, frequent meals every 4-5 hours with lean proteins, complex carbs and healthy fats. Minimize simple carbs, bariatric referral 

## 2017-03-15 NOTE — Progress Notes (Signed)
.  Subjective:  I acted as a Education administrator for Dr. Charlett Blake. [place], [place]   Patient ID: Brandi Lester, female    DOB: June 11, 1966, 51 y.o.   MRN: [id number]  No chief complaint on file.   HPI  Patient is in today for a follow up. She feels well today but is worried about the recent bad press that valsartan has gotten. She would like to change meds. Her only other concern is swelling in her ankles L>R. No fall or trauma. No recent febrile illness or hospitlaizaitons. She is enjoying her summer holiday.  Denies CP/palp/SOB/HA/congestion/fevers/GI or GU c/o. Taking meds as prescribed  Patient Care Team: Mosie Lukes, MD as PCP - General (Family Medicine) Neldon Mc, Donnamarie Poag, MD as Consulting Physician (Allergy and Immunology) Gus Height, MD as Consulting Physician (Obstetrics and Gynecology) Rutherford Guys, MD as Consulting Physician (Ophthalmology) [place], [place] as Consulting Physician (Hand Surgery) Leanora Cover, MD as Consulting Physician (Orthopedic Surgery)   Past Medical History:  Diagnosis Date  . Allergy    allergic rhinitis  . Anemia 07/25/2013  . Costochondritis 02/20/2015  . Diabetes mellitus    Type II   previously 3 years ago - no longer on meds   . Dyslipidemia 07/25/2013  . Fungus infection 10/13   Left great toe  . GERD (gastroesophageal reflux disease)   . Hypertension   . Ingrown nail 10/13   right foot next to the last toe  . Insomnia 04/20/2013  . Obesity   . Pedal edema 02/20/2015  . Preventative health care 09/25/2015    Past Surgical History:  Procedure Laterality Date  . HYSTEROSCOPY N/A 10/04/2015   Procedure: HYSTEROSCOPY with Removal IUD;  Surgeon: Vanessa Kick, MD;  Location: [facility];  Service: Gynecology;  Laterality: N/A;  . lasik     eye surgery  . WISDOM TOOTH EXTRACTION      Family History  Problem Relation Age of Onset  . Heart attack Mother 49  . Cancer Father        prostate  . Heart attack Sister 4  . Hypertension Other      Social History   Social History  . Marital status: Single    Spouse name: N/A  . Number of children: N/A  . Years of education: N/A   Occupational History  . Not on file.   Social History Main Topics  . Smoking status: Former Research scientist (life sciences)  . Smokeless tobacco: Never Used  . Alcohol use Yes     Comment: rare  . Drug use: No  . Sexual activity: Not on file   Other Topics Concern  . Not on file   Social History Narrative  . No narrative on file    Outpatient Medications Prior to Visit  Medication Sig Dispense Refill  . EPINEPHrine (EPIPEN 2-PAK) 0.3 mg/0.3 mL IJ SOAJ injection Use as directed for severe allergic reaction 2 Device 1  . fexofenadine (ALLEGRA ALLERGY) 180 MG tablet Take 1 tablet (180 mg total) by mouth daily. 301 tablet 1  . fluticasone (FLONASE) 50 MCG/ACT nasal spray Place 2 sprays into both nostrils daily. (Patient taking differently: Place 2 sprays into both nostrils daily as needed for allergies. ) 16 g 11  . ibuprofen (ADVIL,MOTRIN) 600 MG tablet Take 1 tablet (600 mg total) by mouth every 6 (six) hours as needed. 90 tablet 0  . Omega-3 Fatty Acids (FISH OIL PO) Take 1 capsule by mouth daily.    . TURMERIC PO Take 1 capsule  by mouth daily.    [name] amLODipine (NORVASC) 5 MG tablet Take 1 tablet (5 mg total) by mouth 2 (two) times daily. 180 tablet 2  . azelastine (ASTELIN) 0.1 % nasal spray Place 2 sprays into both nostrils 2 (two) times daily. Use in each nostril as directed 30 mL 1  . montelukast (SINGULAIR) 10 MG tablet Take 1 tablet (10 mg total) by mouth at bedtime. 90 tablet 1  . pantoprazole (PROTONIX) 40 MG tablet TAKE 1 TABLET (40 MG TOTAL) BY MOUTH DAILY 90 tablet 2  . potassium chloride SA (K-DUR,KLOR-CON) 20 MEQ tablet TAKE 1 TABLET BY MOUTH THREE TIMES A DAY 270 tablet 1  . valsartan (DIOVAN) 320 MG tablet Take 1 tablet (320 mg total) by mouth daily. 30 tablet 1  . valsartan-hydrochlorothiazide (DIOVAN-HCT) 320-25 MG tablet Take 1 tablet by mouth  daily. 90 tablet 1  . azelastine (ASTELIN) 0.1 % nasal spray 2 spray      No facility-administered medications prior to visit.     Allergies  Allergen Reactions  . Penicillins Itching and Swelling    Has patient had a PCN reaction causing immediate rash, facial/tongue/throat swelling, SOB or lightheadedness with hypotension: yes Has patient had a PCN reaction causing severe rash involving mucus membranes or skin necrosis: no Has patient had a PCN reaction that required hospitalization no Has patient had a PCN reaction occurring within the last 10 years: yes If all of the above answers are "NO", then may proceed with Cephalosporin use.   [name] [Liraglutide]     Vomiting    Review of Systems  Constitutional: Negative for fever and malaise/fatigue.  HENT: Negative for congestion.   Eyes: Negative for blurred vision.  Respiratory: Negative for cough and shortness of breath.   Cardiovascular: Positive for leg swelling. Negative for chest pain and palpitations.  Gastrointestinal: Negative for vomiting.  Musculoskeletal: Negative for back pain.  Skin: Negative for rash.  Neurological: Negative for loss of consciousness and headaches.       Objective:    Physical Exam  Constitutional: She is oriented to person, place, and time. She appears well-developed and well-nourished. No distress.  HENT:  Head: Normocephalic and atraumatic.  Eyes: Conjunctivae are normal.  Neck: Normal range of motion. No thyromegaly present.  Cardiovascular: Normal rate and regular rhythm.   Pulmonary/Chest: Effort normal and breath sounds normal. She has no wheezes.  Abdominal: Soft. Bowel sounds are normal. There is no tenderness.  Musculoskeletal: Normal range of motion. She exhibits edema. She exhibits no deformity.  Trace pedal edema right ankle and left ankle with 1 + pedal edema  Neurological: She is alert and oriented to person, place, and time.  Skin: Skin is warm and dry. She is not  diaphoretic.  Psychiatric: She has a normal mood and affect.    BP 140/78 (BP Location: Left Arm, Patient Position: Sitting, Cuff Size: Normal)   Pulse 96   Temp 98.5 F (36.9 C) (Oral)   Resp 18   Wt (!) 331 lb (150.1 kg)   SpO2 98%   BMI 47.49 kg/m  Wt Readings from Last 3 Encounters:  03/15/17 (!) 331 lb (150.1 kg)  11/06/16 (!) 328 lb (148.8 kg)  07/03/16 (!) 321 lb (145.6 kg)   BP Readings from Last 3 Encounters:  03/15/17 140/78  11/06/16 126/70  07/18/16 128/78     Immunization History  Administered Date(s) Administered  . Influenza Whole 06/22/2009, 08/03/2010  . Influenza,inj,Quad PF,36+ Mos 07/20/2013, 05/11/2014, 06/29/2015, 06/26/2016  .  Pneumococcal Conjugate-13 09/15/2015  . Td 07/21/2009    Health Maintenance  Topic Date Due  . OPHTHALMOLOGY EXAM  04/13/2016  . INFLUENZA VACCINE  04/03/2017  . HEMOGLOBIN A1C  09/15/2017  . FOOT EXAM  11/06/2017  . PAP SMEAR  04/03/2018  . TETANUS/TDAP  07/22/2019  . PNEUMOCOCCAL POLYSACCHARIDE VACCINE (2) 09/14/2020  . HIV Screening  Addressed    Lab Results  Component Value Date   WBC 6.7 03/15/2017   HGB 12.5 03/15/2017   HCT 36.8 03/15/2017   PLT 273.0 03/15/2017   GLUCOSE 107 (H) 03/15/2017   CHOL 149 03/15/2017   TRIG 133.0 03/15/2017   HDL 33.70 (L) 03/15/2017   LDLCALC 89 03/15/2017   ALT 9 03/15/2017   AST 11 03/15/2017   NA 137 03/15/2017   K 3.1 (L) 03/15/2017   CL 101 03/15/2017   CREATININE 0.76 03/15/2017   BUN 8 03/15/2017   CO2 27 03/15/2017   TSH 1.26 03/15/2017   HGBA1C 6.6 (H) 03/15/2017   MICROALBUR 0.50 12/15/2012    Lab Results  Component Value Date   TSH 1.26 03/15/2017   Lab Results  Component Value Date   WBC 6.7 03/15/2017   HGB 12.5 03/15/2017   HCT 36.8 03/15/2017   MCV 82.0 03/15/2017   PLT 273.0 03/15/2017   Lab Results  Component Value Date   NA 137 03/15/2017   K 3.1 (L) 03/15/2017   CO2 27 03/15/2017   GLUCOSE 107 (H) 03/15/2017   BUN 8 03/15/2017    CREATININE 0.76 03/15/2017   BILITOT 0.3 03/15/2017   ALKPHOS 71 03/15/2017   AST 11 03/15/2017   ALT 9 03/15/2017   PROT 7.4 03/15/2017   ALBUMIN 4.0 03/15/2017   CALCIUM 9.4 03/15/2017   GFR 104.09 03/15/2017   Lab Results  Component Value Date   CHOL 149 03/15/2017   Lab Results  Component Value Date   HDL 33.70 (L) 03/15/2017   Lab Results  Component Value Date   LDLCALC 89 03/15/2017   Lab Results  Component Value Date   TRIG 133.0 03/15/2017   Lab Results  Component Value Date   CHOLHDL 4 03/15/2017   Lab Results  Component Value Date   HGBA1C 6.6 (H) 03/15/2017         Assessment & Plan:   Problem List Items Addressed This Visit    Obesity    Encouraged DASH diet, decrease po intake and increase exercise as tolerated. Needs 7-8 hours of sleep nightly. Avoid trans fats, eat small, frequent meals every 4-5 hours with lean proteins, complex carbs and healthy fats. Minimize simple carbs, bariatric referral      Relevant Medications   metFORMIN (GLUCOPHAGE) 500 MG tablet   Essential hypertension    Well controlled, no changes to meds. Encouraged heart healthy diet such as the DASH diet and exercise as tolerated. She is worried about the recent literature regarding Valsartan so we will switch her to Losartanhct and check bp and pulse in one month      Relevant Medications   losartan-hydrochlorothiazide (HYZAAR) 100-25 MG tablet   amLODipine (NORVASC) 5 MG tablet   aspirin EC 81 MG tablet   Diabetes mellitus type 2 in obese (HCC)    hgba1c acceptable, minimize simple carbs. Increase exercise as tolerated. Continue current meds but with hgba1c increasing to 6.6 will start Metformin 500 mg daily.       Relevant Medications   losartan-hydrochlorothiazide (HYZAAR) 100-25 MG tablet   aspirin EC 81 MG  tablet   metFORMIN (GLUCOPHAGE) 500 MG tablet   Other Relevant Orders   Hemoglobin A1c (Completed)   Anemia    Check cbc      Dyslipidemia    Encouraged  heart healthy diet, increase exercise, avoid trans fats, consider a krill oil cap daily      Relevant Orders   Lipid panel (Completed)   Pedal edema    L>R, encouraged to elevate ankles above heart for 15 minutes twice daily. Minimize sodium, compression hose daily. Consider meds if worsens.       Hypokalemia    Other Visit Diagnoses    Allergic state, initial encounter       Benign essential HTN       Relevant Medications   losartan-hydrochlorothiazide (HYZAAR) 100-25 MG tablet   amLODipine (NORVASC) 5 MG tablet   aspirin EC 81 MG tablet   Other Relevant Orders   CBC (Completed)   Comprehensive metabolic panel (Completed)   TSH (Completed)      I have discontinued Ms. Donathan montelukast, amLODipine, valsartan, valsartan-hydrochlorothiazide, potassium chloride SA, and pantoprazole. I am also having her start on losartan-hydrochlorothiazide, amLODipine, fluticasone, montelukast, pantoprazole, aspirin EC, and metFORMIN. Additionally, I am having her maintain her fluticasone, fexofenadine, TURMERIC PO, Omega-3 Fatty Acids (FISH OIL PO), ibuprofen, EPINEPHrine, and azelastine. We will stop administering azelastine.  Meds ordered this encounter  Medications  . DISCONTD: potassium chloride SA (K-DUR,KLOR-CON) 20 MEQ tablet    Sig: Take 1 tablet (20 mEq total) by mouth 3 (three) times daily.    Dispense:  270 tablet    Refill:  1  . losartan-hydrochlorothiazide (HYZAAR) 100-25 MG tablet    Sig: Take 1 tablet by mouth daily.    Dispense:  90 tablet    Refill:  1  . amLODipine (NORVASC) 5 MG tablet    Sig: Take 1 tablet (5 mg total) by mouth 2 (two) times daily.    Dispense:  180 tablet    Refill:  1  . azelastine (ASTELIN) 0.1 % nasal spray    Sig: Place 2 sprays into both nostrils 2 (two) times daily. Use in each nostril as directed    Dispense:  90 mL    Refill:  1  . fluticasone (FLONASE) 50 MCG/ACT nasal spray    Sig: Place 2 sprays into both nostrils daily.    Dispense:   48 g    Refill:  1  . montelukast (SINGULAIR) 10 MG tablet    Sig: Take 1 tablet (10 mg total) by mouth at bedtime.    Dispense:  90 tablet    Refill:  1  . pantoprazole (PROTONIX) 40 MG tablet    Sig: Take 1 tablet (40 mg total) by mouth daily.    Dispense:  90 tablet    Refill:  1  . aspirin EC 81 MG tablet    Sig: Take 1 tablet (81 mg total) by mouth daily.  . metFORMIN (GLUCOPHAGE) 500 MG tablet    Sig: Take 1 tablet (500 mg total) by mouth daily with breakfast.    Dispense:  90 tablet    Refill:  1    CMA served as scribe during this visit. History, Physical and Plan performed by medical provider. Documentation and orders reviewed and attested to.  Penni Homans, MD

## 2017-03-17 MED ORDER — METFORMIN HCL 500 MG PO TABS: 500.0000 mg | ORAL_TABLET | Freq: Every day | ORAL | 1 refills | Status: DC

## 2017-03-17 NOTE — Assessment & Plan Note (Signed)
L>R, encouraged to elevate ankles above heart for 15 minutes twice daily. Minimize sodium, compression hose daily. Consider meds if worsens.

## 2017-03-18 MED FILL — metFORMIN HCL 500 MG TABS: 500 | 90 days supply | Qty: 90 | Fill #0

## 2017-03-29 LAB — HM DIABETES EYE EXAM

## 2017-04-04 DIAGNOSIS — J301 Allergic rhinitis due to pollen: Secondary | ICD-10-CM | POA: Diagnosis not present

## 2017-04-08 ENCOUNTER — Encounter: Payer: Self-pay | Admitting: Family Medicine

## 2017-04-12 ENCOUNTER — Ambulatory Visit (INDEPENDENT_AMBULATORY_CARE_PROVIDER_SITE_OTHER): Payer: BC Managed Care – PPO

## 2017-04-12 DIAGNOSIS — J309 Allergic rhinitis, unspecified: Secondary | ICD-10-CM

## 2017-04-16 ENCOUNTER — Ambulatory Visit (INDEPENDENT_AMBULATORY_CARE_PROVIDER_SITE_OTHER): Payer: BC Managed Care – PPO | Admitting: Family Medicine

## 2017-04-16 VITALS — BP 127/83 | HR 95

## 2017-04-16 DIAGNOSIS — I10 Essential (primary) hypertension: Secondary | ICD-10-CM | POA: Diagnosis not present

## 2017-04-22 NOTE — Progress Notes (Signed)
DOCUMENTATION FOR *-/20/18-    Patient I for BP check per order from Dr. Charlett Blake dated 03/15/17.  BP =127/83 P =95   No complaints voiced this visit. Per Dr. Carollee Herter covering for Dr. Charlett Blake Patient to continue medications prescribed and return 07/2017 for OV with Dr. Charlett Blake.    Ann Held, DO

## 2017-04-23 ENCOUNTER — Encounter: Payer: Self-pay | Admitting: Family Medicine

## 2017-04-23 ENCOUNTER — Ambulatory Visit: Payer: BC Managed Care – PPO | Admitting: Allergy and Immunology

## 2017-05-14 ENCOUNTER — Ambulatory Visit: Payer: BC Managed Care – PPO | Admitting: Allergy and Immunology

## 2017-05-14 ENCOUNTER — Encounter (INDEPENDENT_AMBULATORY_CARE_PROVIDER_SITE_OTHER): Payer: BC Managed Care – PPO

## 2017-05-16 ENCOUNTER — Other Ambulatory Visit: Payer: Self-pay | Admitting: Obstetrics and Gynecology

## 2017-05-16 ENCOUNTER — Ambulatory Visit (INDEPENDENT_AMBULATORY_CARE_PROVIDER_SITE_OTHER): Payer: BC Managed Care – PPO | Admitting: *Deleted

## 2017-05-16 DIAGNOSIS — Z1231 Encounter for screening mammogram for malignant neoplasm of breast: Secondary | ICD-10-CM

## 2017-05-16 DIAGNOSIS — J309 Allergic rhinitis, unspecified: Secondary | ICD-10-CM

## 2017-05-21 ENCOUNTER — Ambulatory Visit (INDEPENDENT_AMBULATORY_CARE_PROVIDER_SITE_OTHER): Payer: BC Managed Care – PPO | Admitting: Allergy and Immunology

## 2017-05-21 VITALS — BP 132/80 | HR 103 | Resp 19

## 2017-05-21 DIAGNOSIS — J3089 Other allergic rhinitis: Secondary | ICD-10-CM

## 2017-05-21 DIAGNOSIS — K219 Gastro-esophageal reflux disease without esophagitis: Secondary | ICD-10-CM

## 2017-05-21 MED ORDER — EPINEPHRINE 0.3 MG/0.3ML IJ SOAJ: INTRAMUSCULAR | 1 refills | Status: DC

## 2017-05-21 NOTE — Progress Notes (Signed)
Follow-up Note  Referring Provider: Mosie Lukes, MD Primary Provider: Mosie Lukes, MD Date of Office Visit: 05/21/2017  Subjective:   Brandi Lester (DOB: Oct 29, 1965) is a 51 y.o. female who returns to the [facility] on 05/21/2017 in re-evaluation of the following:  HPI: Brandi Lester returns to this clinic in reevaluation of her allergic rhinoconjunctivitis treated with immunotherapy and reflux. Her last visit to this clinic was October 2016.  Currently she is using her immunotherapy every 3 weeks. This has resulted in excellent control of her atopic upper airway and eye disease. She no longer needs to use any nasal steroids and occasionally will use a oral or a nasal antihistamine. She has not had any adverse effects secondary to her immunotherapy.  Her reflux remains under excellent control while using pantoprazole.  Allergies as of 05/21/2017      Reactions   Penicillins Itching, Swelling   Has patient had a PCN reaction causing immediate rash, facial/tongue/throat swelling, SOB or lightheadedness with hypotension: yes Has patient had a PCN reaction causing severe rash involving mucus membranes or skin necrosis: no Has patient had a PCN reaction that required hospitalization no Has patient had a PCN reaction occurring within the last 10 years: yes If all of the above answers are "NO", then may proceed with Cephalosporin use.   Victoza [liraglutide]    Vomiting      Medication List      amLODipine 10 MG tablet Commonly known as:  NORVASC amlodipine 10 mg tablet   aspirin EC 81 MG tablet Take 1 tablet (81 mg total) by mouth daily.   azelastine 0.1 % nasal spray Commonly known as:  ASTELIN Place 2 sprays into both nostrils 2 (two) times daily. Use in each nostril as directed   EPINEPHrine 0.3 mg/0.3 mL Soaj injection Commonly known as:  EPIPEN 2-PAK Use as directed for severe allergic reaction   fexofenadine 180 MG tablet Commonly known as:   ALLEGRA ALLERGY Take 1 tablet (180 mg total) by mouth daily.   FISH OIL PO Take 1 capsule by mouth daily.   fluticasone 50 MCG/ACT nasal spray Commonly known as:  FLONASE Place 2 sprays into both nostrils daily.   glucose blood test strip OneTouch Ultra Test strips   ibuprofen 600 MG tablet Commonly known as:  ADVIL,MOTRIN Take 1 tablet (600 mg total) by mouth every 6 (six) hours as needed.   losartan-hydrochlorothiazide 100-25 MG tablet Commonly known as:  HYZAAR Take 1 tablet by mouth daily.   metFORMIN 500 MG tablet Commonly known as:  GLUCOPHAGE Take 1 tablet (500 mg total) by mouth daily with breakfast.   montelukast 10 MG tablet Commonly known as:  SINGULAIR Take 1 tablet (10 mg total) by mouth at bedtime.   ONETOUCH DELICA LANCETS 66Y Misc OneTouch Delica Lancets 33 gauge   pantoprazole 40 MG tablet Commonly known as:  PROTONIX Take 1 tablet (40 mg total) by mouth daily.   potassium chloride SA 20 MEQ tablet Commonly known as:  K-DUR,KLOR-CON Take 1 tablet (20 mEq total) by mouth 4 (four) times daily.   TURMERIC PO Take 1 capsule by mouth daily.       Past Medical History:  Diagnosis Date  . Allergy    allergic rhinitis  . Anemia 07/25/2013  . Costochondritis 02/20/2015  . Diabetes mellitus    Type II   previously 3 years ago - no longer on meds   . Dyslipidemia 07/25/2013  . Fungus infection  10/13   Left great toe  . GERD (gastroesophageal reflux disease)   . Hypertension   . Ingrown nail 10/13   right foot next to the last toe  . Insomnia 04/20/2013  . Obesity   . Pedal edema 02/20/2015  . Preventative health care 09/25/2015    Past Surgical History:  Procedure Laterality Date  . HYSTEROSCOPY N/A 10/04/2015   Procedure: HYSTEROSCOPY with Removal IUD;  Surgeon: Vanessa Kick, MD;  Location: [facility];  Service: Gynecology;  Laterality: N/A;  . lasik     eye surgery  . WISDOM TOOTH EXTRACTION      Review of systems negative except as noted in  HPI / PMHx or noted below:  Review of Systems  Constitutional: Negative.   HENT: Negative.   Eyes: Negative.   Respiratory: Negative.   Cardiovascular: Negative.   Gastrointestinal: Negative.   Genitourinary: Negative.   Musculoskeletal: Negative.   Skin: Negative.   Neurological: Negative.   Endo/Heme/Allergies: Negative.   Psychiatric/Behavioral: Negative.      Objective:   Vitals:   05/21/17 1709  BP: 132/80  Pulse: (!) 103  Resp: 19  SpO2: 99%          Physical Exam  Constitutional: She is well-developed, well-nourished, and in no distress.  HENT:  Head: Normocephalic.  Right Ear: Tympanic membrane, external ear and ear canal normal.  Left Ear: Tympanic membrane, external ear and ear canal normal.  Nose: Nose normal. No mucosal edema or rhinorrhea.  Mouth/Throat: Uvula is midline, oropharynx is clear and moist and mucous membranes are normal. No oropharyngeal exudate.  Eyes: Conjunctivae are normal.  Neck: Trachea normal. No tracheal tenderness present. No tracheal deviation present. No thyromegaly present.  Cardiovascular: Normal rate, regular rhythm, S1 normal, S2 normal and normal heart sounds.   No murmur heard. Pulmonary/Chest: Breath sounds normal. No stridor. No respiratory distress. She has no wheezes. She has no rales.  Musculoskeletal: She exhibits no edema.  Lymphadenopathy:       Head (right side): No tonsillar adenopathy present.       Head (left side): No tonsillar adenopathy present.    She has no cervical adenopathy.  Neurological: She is alert. Gait normal.  Skin: No rash noted. She is not diaphoretic. No erythema. Nails show no clubbing.  Psychiatric: Mood and affect normal.    Diagnostics: none  Assessment and Plan:   1. Other allergic rhinitis   2. Gastroesophageal reflux disease, esophagitis presence not specified     1. Continue immunotherapy for one additional year and Epi-Pen  2. Can restart Flonase one-2 sprays each nostril  one time per day during periods of upper airway symptoms.  3. Continue Allerga and Astelin if needed  4. Continue Pantoprazole 40 one time per day  5. Get flu vaccine  6.  Return in 12 months  Brandi Lester appears to have had a good response with a course of immunotherapy. We will complete one additional year of immunotherapy which will give her a total of 5 years and then assume she will be a long-term responder and discontinue this form of treatment. She will continue on other medications as noted above to address her atopic disease and reflux. I will regroup with her in 12 months or earlier if there is a problem.  Allena Katz, MD Allergy / Immunology [place]

## 2017-05-21 NOTE — Patient Instructions (Addendum)
  1. Continue immunotherapy for one additional year and Epi-Pen  2. Can restart Flonase one-2 sprays each nostril one time per day during periods of upper airway symptoms.  3. Continue Allerga and Astelin if needed  4. Continue Pantoprazole 40 one time per day  5. Get flu vaccine  6.  Return in 12 months

## 2017-05-22 ENCOUNTER — Encounter: Payer: Self-pay | Admitting: Allergy and Immunology

## 2017-05-22 MED FILL — EPINEPHRINE 0.3 MG AUTO-INJ: 0.3 | 2 days supply | Qty: 2 | Fill #0

## 2017-05-23 ENCOUNTER — Encounter (INDEPENDENT_AMBULATORY_CARE_PROVIDER_SITE_OTHER): Payer: Self-pay | Admitting: Family Medicine

## 2017-05-23 ENCOUNTER — Ambulatory Visit (INDEPENDENT_AMBULATORY_CARE_PROVIDER_SITE_OTHER): Payer: BC Managed Care – PPO | Admitting: Family Medicine

## 2017-05-23 VITALS — BP 124/81 | HR 85 | Temp 98.5°F | Ht 69.0 in | Wt 325.0 lb

## 2017-05-23 DIAGNOSIS — R0602 Shortness of breath: Secondary | ICD-10-CM | POA: Diagnosis not present

## 2017-05-23 DIAGNOSIS — E669 Obesity, unspecified: Secondary | ICD-10-CM | POA: Diagnosis not present

## 2017-05-23 DIAGNOSIS — IMO0001 Reserved for inherently not codable concepts without codable children: Secondary | ICD-10-CM

## 2017-05-23 DIAGNOSIS — E119 Type 2 diabetes mellitus without complications: Secondary | ICD-10-CM

## 2017-05-23 DIAGNOSIS — Z1389 Encounter for screening for other disorder: Secondary | ICD-10-CM

## 2017-05-23 DIAGNOSIS — R5383 Other fatigue: Secondary | ICD-10-CM

## 2017-05-23 DIAGNOSIS — Z6841 Body Mass Index (BMI) 40.0 and over, adult: Secondary | ICD-10-CM

## 2017-05-23 DIAGNOSIS — Z1331 Encounter for screening for depression: Secondary | ICD-10-CM

## 2017-05-23 DIAGNOSIS — Z0289 Encounter for other administrative examinations: Secondary | ICD-10-CM

## 2017-05-23 NOTE — Progress Notes (Signed)
Office: [phone]  /  Fax: [phone]   Dear Dr. Charlett Blake,   Thank you for referring Brandi Lester to our clinic. The following note includes my evaluation and treatment recommendations.  HPI:   Chief Complaint: OBESITY    Brandi Lester has been referred by Erline Levine A. Charlett Blake, MD for consultation regarding *HIS* obesity and obesity related comorbidities.    Brandi Lester (MR# [id number]) is a 51 y.o. female who presents on 05/23/2017 for obesity evaluation and treatment. Current BMI is Body mass index is 47.99 kg/m.[name] Brandi Lester has been struggling with her weight for many years and has been unsuccessful in either losing weight, maintaining weight loss, or reaching her healthy weight goal.     Brandi Lester attended our information session and states she is currently in the action stage of change and ready to dedicate time achieving and maintaining a healthier weight. Brandi Lester is interested in becoming our patient and working on intensive lifestyle modifications including (but not limited to) diet, exercise and weight loss.    Brandi Lester states she struggles with family and or coworkers weight loss sabotage her desired weight loss is 130 lbs she started gaining weight in the last 10 yrs her heaviest weight ever was 330 lbs. she has significant food cravings issues  she snacks frequently in the evenings she is frequently drinking liquids with calories she frequently makes poor food choices she frequently eats larger portions than normal  she has binge eating behaviors she struggles with emotional eating    Fatigue Brandi Lester feels her energy is lower than it should be. This has worsened with weight gain and has not worsened recently. Brandi Lester admits to daytime somnolence and  admits to waking up still tired. Patient is at risk for obstructive sleep apnea. Patent has a history of symptoms of daytime fatigue. Patient generally gets 5 hours of sleep per night, and states they generally have restful  sleep. Snoring is not present. Apneic episodes are not present. Epworth Sleepiness Score is 6  Dyspnea on exertion Brandi Lester notes increasing shortness of breath with exercising and seems to be worsening over time with weight gain. She notes getting out of breath sooner with activity than she used to. This has not gotten worse recently. Brandi Lester denies orthopnea.  Diabetes II [name] has a diagnosis of diabetes type II. She thinks she has pre-diabetes and was educated on A1c and fasting goals. Dalicia denies any hypoglycemic episodes. She is on metformin once daily now (last A1c was at 6.6). She is attempting to work on intensive lifestyle modifications including diet, exercise, and weight loss to help control her blood glucose levels.   Depression Screen Brandi Lester Food and Mood (modified PHQ-9) score was  Depression screen PHQ 2/9 05/23/2017  Decreased Interest 2  Down, Depressed, Hopeless 1  PHQ - 2 Score 3  Altered sleeping 1  Tired, decreased energy 1  Change in appetite 1  Feeling bad or failure about yourself  0  Trouble concentrating 1  Moving slowly or fidgety/restless 0  Suicidal thoughts 0  PHQ-9 Score 7  Difficult doing work/chores Not difficult at all    ALLERGIES: Allergies  Allergen Reactions   Penicillins Itching and Swelling    Has patient had a PCN reaction causing immediate rash, facial/tongue/throat swelling, SOB or lightheadedness with hypotension: yes Has patient had a PCN reaction causing severe rash involving mucus membranes or skin necrosis: no Has patient had a PCN reaction that required hospitalization no Has patient had a PCN reaction occurring within  the last 10 years: yes If all of the above answers are "NO", then may proceed with Cephalosporin use.    Victoza [Liraglutide]     Vomiting    MEDICATIONS: Current Outpatient Prescriptions on File Prior to Visit  Medication Sig Dispense Refill   amLODipine (NORVASC) 10 MG tablet amlodipine 10 mg  tablet     aspirin EC 81 MG tablet Take 1 tablet (81 mg total) by mouth daily.     azelastine (ASTELIN) 0.1 % nasal spray Place 2 sprays into both nostrils 2 (two) times daily. Use in each nostril as directed 90 mL 1   EPINEPHrine (EPIPEN 2-PAK) 0.3 mg/0.3 mL IJ SOAJ injection Use as directed for severe allergic reaction 2 Device 1   fexofenadine (ALLEGRA ALLERGY) 180 MG tablet Take 1 tablet (180 mg total) by mouth daily. 301 tablet 1   fluticasone (FLONASE) 50 MCG/ACT nasal spray Place 2 sprays into both nostrils daily. (Patient taking differently: Place 2 sprays into both nostrils daily as needed for allergies. ) 16 g 11   glucose blood test strip OneTouch Ultra Test strips     losartan-hydrochlorothiazide (HYZAAR) 100-25 MG tablet Take 1 tablet by mouth daily. 90 tablet 1   metFORMIN (GLUCOPHAGE) 500 MG tablet Take 1 tablet (500 mg total) by mouth daily with breakfast. 90 tablet 1   montelukast (SINGULAIR) 10 MG tablet Take 1 tablet (10 mg total) by mouth at bedtime. 90 tablet 1   Omega-3 Fatty Acids (FISH OIL PO) Take 1 capsule by mouth daily.     ONETOUCH DELICA LANCETS 05L MISC OneTouch Delica Lancets 33 gauge     pantoprazole (PROTONIX) 40 MG tablet Take 1 tablet (40 mg total) by mouth daily. 90 tablet 1   potassium chloride SA (K-DUR,KLOR-CON) 20 MEQ tablet Take 1 tablet (20 mEq total) by mouth 4 (four) times daily. 120 tablet 3   TURMERIC PO Take 1 capsule by mouth daily.     ibuprofen (ADVIL,MOTRIN) 600 MG tablet Take 1 tablet (600 mg total) by mouth every 6 (six) hours as needed. (Patient not taking: Reported on 05/23/2017) 90 tablet 0   No current facility-administered medications on file prior to visit.     PAST MEDICAL HISTORY: Past Medical History:  Diagnosis Date   ADD (attention deficit disorder)    Allergy    allergic rhinitis   Anemia 07/25/2013   Back pain    Costochondritis 02/20/2015   Diabetes mellitus    Type II   previously 3 years ago - no  longer on meds    Dyslipidemia 07/25/2013   Fungus infection 10/13   Left great toe   GERD (gastroesophageal reflux disease)    Hypertension    IBS (irritable bowel syndrome)    Ingrown nail 10/13   right foot next to the last toe   Insomnia 04/20/2013   Joint pain    Lactose intolerance    Leg edema    Obesity    Pedal edema 02/20/2015   Prediabetes    Preventative health care 09/25/2015   PVC (premature ventricular contraction)     PAST SURGICAL HISTORY: Past Surgical History:  Procedure Laterality Date   HYSTEROSCOPY N/A 10/04/2015   Procedure: HYSTEROSCOPY with Removal IUD;  Surgeon: Vanessa Kick, MD;  Location: [facility];  Service: Gynecology;  Laterality: N/A;   lasik     eye surgery   WISDOM TOOTH EXTRACTION      SOCIAL HISTORY: Social History  Substance Use Topics   Smoking status: Former  Smoker    Packs/day: 1.00    Years: 15.00    Quit date: 09/03/2001   Smokeless tobacco: Never Used   Alcohol use Yes     Comment: rare    FAMILY HISTORY: Family History  Problem Relation Age of Onset   Heart attack Mother 65   Hypertension Mother    Heart disease Mother    Obesity Mother    Cancer Father        prostate   Heart attack Sister 22   Hypertension Other     ROS: Review of Systems  Constitutional: Positive for malaise/fatigue.  HENT: Positive for congestion (nasal stuffiness) and sinus pain.   Eyes:       Wear Glasses or Contacts  Respiratory: Positive for shortness of breath (on exertion).   Cardiovascular: Negative for orthopnea.  Gastrointestinal: Positive for constipation and diarrhea.  Musculoskeletal: Positive for back pain.       Muscle or Joint Pain  Skin:       Dryness   Endo/Heme/Allergies:       Hay Fever Negative hypoglycemia    PHYSICAL EXAM: Blood pressure 124/81, pulse 85, temperature 98.5 F (36.9 C), temperature source Oral, height 5\' 9"  (1.753 m), weight (!) 325 lb (147.4 kg), last menstrual period  03/03/2017, SpO2 97 %. Body mass index is 47.99 kg/m. Physical Exam  Constitutional: She is oriented to person, place, and time. She appears well-developed and well-nourished.  Cardiovascular: Normal rate.   Pulmonary/Chest: Effort normal.  Musculoskeletal: Normal range of motion.  Neurological: She is oriented to person, place, and time.  Skin: Skin is warm and dry.  Psychiatric: She has a normal mood and affect. Her behavior is normal.  Vitals reviewed.   RECENT LABS AND TESTS: BMET    Component Value Date/Time   NA 137 03/15/2017 1140   K 3.1 (L) 03/15/2017 1140   CL 101 03/15/2017 1140   CO2 27 03/15/2017 1140   GLUCOSE 107 (H) 03/15/2017 1140   BUN 8 03/15/2017 1140   CREATININE 0.76 03/15/2017 1140   CREATININE 0.72 02/03/2014 1648   CALCIUM 9.4 03/15/2017 1140   GFRNONAA 145 10/14/2008 0858   GFRAA 176 10/14/2008 0858   Lab Results  Component Value Date   HGBA1C 6.6 (H) 03/15/2017   No results found for: INSULIN CBC    Component Value Date/Time   WBC 6.7 03/15/2017 1140   RBC 4.49 03/15/2017 1140   HGB 12.5 03/15/2017 1140   HCT 36.8 03/15/2017 1140   PLT 273.0 03/15/2017 1140   MCV 82.0 03/15/2017 1140   MCH 28.2 02/03/2014 1648   MCHC 34.0 03/15/2017 1140   RDW 14.4 03/15/2017 1140   LYMPHSABS 2.1 06/09/2014 0843   MONOABS 0.4 06/09/2014 0843   EOSABS 0.1 06/09/2014 0843   BASOSABS 0.0 06/09/2014 0843   Iron/TIBC/Ferritin/ %Sat No results found for: IRON, TIBC, FERRITIN, IRONPCTSAT Lipid Panel     Component Value Date/Time   CHOL 149 03/15/2017 1140   TRIG 133.0 03/15/2017 1140   HDL 33.70 (L) 03/15/2017 1140   CHOLHDL 4 03/15/2017 1140   VLDL 26.6 03/15/2017 1140   LDLCALC 89 03/15/2017 1140   Hepatic Function Panel     Component Value Date/Time   PROT 7.4 03/15/2017 1140   ALBUMIN 4.0 03/15/2017 1140   AST 11 03/15/2017 1140   ALT 9 03/15/2017 1140   ALKPHOS 71 03/15/2017 1140   BILITOT 0.3 03/15/2017 1140   BILIDIR 0.0 06/09/2014  0843   IBILI 0.1 (L)  02/03/2014 1648      Component Value Date/Time   TSH 1.26 03/15/2017 1140   TSH 1.68 11/06/2016 1438   TSH 1.48 07/18/2016 1613    ECG  shows NSR with a rate of 90 BPM INDIRECT CALORIMETER done today shows a VO2 of 286 and a Brandi of 1993.  Her calculated basal metabolic rate is 1308 thus her basal metabolic rate is worse than expected.    ASSESSMENT AND PLAN: Other fatigue - Plan: EKG 12-Lead, Vitamin B12, CBC With Differential, Folate, VITAMIN D 25 Hydroxy (Vit-D Deficiency, Fractures), TSH, T4, free, T3, Lipid Panel With LDL/HDL Ratio  Shortness of breath on exertion  Type 2 diabetes mellitus without complication, without long-term current use of insulin (HCC) - Plan: Comprehensive metabolic panel, Hemoglobin A1c, Insulin, random, Microalbumin / creatinine urine ratio  Depression screening  Class 3 obesity with serious comorbidity and body mass index (BMI) of 45.0 to 49.9 in adult, unspecified obesity type (HCC)  PLAN: Fatigue Brandi Lester was informed that her fatigue may be related to obesity, depression or many other causes. Labs will be ordered, and in the meanwhile Brandi Lester has agreed to work on diet, exercise and weight loss to help with fatigue. Proper sleep hygiene was discussed including the need for 7-8 hours of quality sleep each night. A sleep study was not ordered based on symptoms and Epworth score.  Dyspnea on exertion Brandi Lester's shortness of breath appears to be obesity related and exercise induced. She has agreed to work on weight loss and gradually increase exercise to treat her exercise induced shortness of breath. If Makayela follows our instructions and loses weight without improvement of her shortness of breath, we will plan to refer to pulmonology. We will monitor this condition regularly. Brandi Lester agrees to this plan.  Diabetes II Brandi Lester has been given extensive diabetes education by myself today including ideal fasting and post-prandial blood  glucose readings, individual ideal Hgb A1c goals  and hypoglycemia prevention. We discussed the importance of good blood sugar control to decrease the likelihood of diabetic complications such as nephropathy, neuropathy, limb loss, blindness, coronary artery disease, and death. We discussed the importance of intensive lifestyle modification including diet, exercise and weight loss as the first line treatment for diabetes. We will check labs and Brandi Lester agrees to continue with diet, exercise and weight loss to help control diabetes. Brandi Lester agrees to continue her diabetes medications and will follow up at the agreed upon time.  Depression Screen Brandi Lester had a mildly positive depression screening. Depression is commonly associated with obesity and often results in emotional eating behaviors. We will monitor this closely and work on CBT to help improve the non-hunger eating patterns. Referral to Psychology may be required if no improvement is seen as she continues in our clinic.  Obesity Brandi Lester is currently in the action stage of change and her goal is to continue with weight loss efforts. I recommend Brandi Lester begin the structured treatment plan as follows:  She has agreed to follow the Category 3 plan Atia has been instructed to eventually work up to a goal of 150 minutes of combined cardio and strengthening exercise per week for weight loss and overall health benefits. We discussed the following Behavioral Modification Strategies today: increasing lean protein intake, decreasing simple carbohydrates  and work on meal planning and easy cooking plans   She was informed of the importance of frequent follow up visits to maximize her success with intensive lifestyle modifications for her multiple health conditions. She was informed we  would discuss her lab results at her next visit unless there is a critical issue that needs to be addressed sooner. Torianne agreed to keep her next visit at the agreed upon  time to discuss these results.  I, Doreene Nest, am acting as transcriptionist for  Dennard Nip, MD  I have reviewed the above documentation for accuracy and completeness, and I agree with the above. -Dennard Nip, MD    OBESITY BEHAVIORAL INTERVENTION VISIT  Today's visit was # 1 out of 75.  Starting weight: 325 lbs Starting date: 05/23/17 Today's weight : 325 lbs Today's date: 05/23/2017 Total lbs lost to date: 0 (Patients must lose 7 lbs in the first 6 months to continue with counseling)   ASK: We discussed the diagnosis of obesity with Brandi Lester today and Tammatha agreed to give [name] permission to discuss obesity behavioral modification therapy today.  ASSESS: Stori has the diagnosis of obesity and her BMI today is 47.97 Briselda is in the action stage of change   ADVISE: Marilouise was educated on the multiple health risks of obesity as well as the benefit of weight loss to improve her health. She was advised of the need for long term treatment and the importance of lifestyle modifications.  AGREE: Multiple dietary modification options and treatment options were discussed and  Verita agreed to follow the Category 3 plan We discussed the following Behavioral Modification Strategies today: increasing lean protein intake, decreasing simple carbohydrates  and work on meal planning and easy cooking plans

## 2017-05-24 LAB — CBC WITH DIFFERENTIAL
BASOS ABS: 0 10*3/uL (ref 0.0–0.2)
Basos: 0 %
EOS (ABSOLUTE): 0.1 10*3/uL (ref 0.0–0.4)
EOS: 1 %
HEMATOCRIT: 36.6 % (ref 34.0–46.6)
Hemoglobin: 12.1 g/dL (ref 11.1–15.9)
IMMATURE GRANULOCYTES: 0 %
Immature Grans (Abs): 0 10*3/uL (ref 0.0–0.1)
LYMPHS ABS: 1.7 10*3/uL (ref 0.7–3.1)
LYMPHS: 24 %
MCH: 27.1 pg (ref 26.6–33.0)
MCHC: 33.1 g/dL (ref 31.5–35.7)
MCV: 82 fL (ref 79–97)
MONOCYTES: 5 %
Monocytes Absolute: 0.4 10*3/uL (ref 0.1–0.9)
NEUTROS PCT: 70 %
Neutrophils Absolute: 4.8 10*3/uL (ref 1.4–7.0)
RBC: 4.46 x10E6/uL (ref 3.77–5.28)
RDW: 14.9 % (ref 12.3–15.4)
WBC: 7 10*3/uL (ref 3.4–10.8)

## 2017-05-24 LAB — LIPID PANEL WITH LDL/HDL RATIO
Cholesterol, Total: 139 mg/dL (ref 100–199)
HDL: 34 mg/dL — AB (ref >39)
LDL CALC: 82 mg/dL (ref 0–99)
LDl/HDL Ratio: 2.4 ratio (ref 0.0–3.2)
Triglycerides: 113 mg/dL (ref 0–149)
VLDL CHOLESTEROL CAL: 23 mg/dL (ref 5–40)

## 2017-05-24 LAB — VITAMIN B12

## 2017-05-24 LAB — COMPREHENSIVE METABOLIC PANEL
A/G RATIO: 1.4 (ref 1.2–2.2)
ALT: 12 IU/L (ref 0–32)
AST: 8 IU/L (ref 0–40)
Albumin: 4.3 g/dL (ref 3.5–5.5)
Alkaline Phosphatase: 85 IU/L (ref 39–117)
BILIRUBIN TOTAL: 0.2 mg/dL (ref 0.0–1.2)
BUN/Creatinine Ratio: 13 (ref 9–23)
BUN: 9 mg/dL (ref 6–24)
CHLORIDE: 100 mmol/L (ref 96–106)
CO2: 25 mmol/L (ref 20–29)
Calcium: 9.2 mg/dL (ref 8.7–10.2)
Creatinine, Ser: 0.71 mg/dL (ref 0.57–1.00)
GFR calc non Af Amer: 100 mL/min/{1.73_m2} (ref >59)
GFR, EST AFRICAN AMERICAN: 116 mL/min/{1.73_m2} (ref >59)
GLOBULIN, TOTAL: 3 g/dL (ref 1.5–4.5)
Glucose: 100 mg/dL — ABNORMAL HIGH (ref 65–99)
POTASSIUM: 3.6 mmol/L (ref 3.5–5.2)
SODIUM: 139 mmol/L (ref 134–144)
TOTAL PROTEIN: 7.3 g/dL (ref 6.0–8.5)

## 2017-05-24 LAB — T3: T3 TOTAL: 123 ng/dL (ref 71–180)

## 2017-05-24 LAB — HEMOGLOBIN A1C
ESTIMATED AVERAGE GLUCOSE: 131 mg/dL
Hgb A1c MFr Bld: 6.2 % — ABNORMAL HIGH (ref 4.8–5.6)

## 2017-05-24 LAB — FOLATE: Folate: 7.1 ng/mL (ref >3.0)

## 2017-05-24 LAB — T4, FREE: Free T4: 1.14 ng/dL (ref 0.82–1.77)

## 2017-05-24 LAB — VITAMIN D 25 HYDROXY (VIT D DEFICIENCY, FRACTURES): VIT D 25 HYDROXY: 14.9 ng/mL — AB (ref 30.0–100.0)

## 2017-05-24 LAB — TSH: TSH: 1.71 u[IU]/mL (ref 0.450–4.500)

## 2017-05-24 LAB — INSULIN, RANDOM: INSULIN: 31.1 u[IU]/mL — ABNORMAL HIGH (ref 2.6–24.9)

## 2017-05-25 ENCOUNTER — Encounter (INDEPENDENT_AMBULATORY_CARE_PROVIDER_SITE_OTHER): Payer: Self-pay | Admitting: Family Medicine

## 2017-05-25 LAB — MICROALBUMIN / CREATININE URINE RATIO
CREATININE, UR: 354 mg/dL
MICROALB/CREAT RATIO: 5.9 mg/g{creat} (ref 0.0–30.0)
MICROALBUM., U, RANDOM: 21 ug/mL

## 2017-06-06 ENCOUNTER — Ambulatory Visit (INDEPENDENT_AMBULATORY_CARE_PROVIDER_SITE_OTHER): Payer: BC Managed Care – PPO | Admitting: Family Medicine

## 2017-06-06 VITALS — BP 126/79 | HR 95 | Temp 98.7°F | Ht 69.0 in | Wt 316.0 lb

## 2017-06-06 DIAGNOSIS — E119 Type 2 diabetes mellitus without complications: Secondary | ICD-10-CM | POA: Diagnosis not present

## 2017-06-06 DIAGNOSIS — Z6841 Body Mass Index (BMI) 40.0 and over, adult: Secondary | ICD-10-CM

## 2017-06-06 DIAGNOSIS — E559 Vitamin D deficiency, unspecified: Secondary | ICD-10-CM

## 2017-06-06 MED ORDER — VITAMIN D (ERGOCALCIFEROL) 1.25 MG (50000 UNIT) PO CAPS: 50000.0000 [IU] | ORAL_CAPSULE | ORAL | 0 refills | Status: DC

## 2017-06-06 MED FILL — VIT D2 1.25 MG (50,000 UNIT: 1.25 MG | 28 days supply | Qty: 4 | Fill #0

## 2017-06-07 MED FILL — POTASSIUM CL ER 20 MEQ TABL: 20 | 30 days supply | Qty: 120 | Fill #0

## 2017-06-10 NOTE — Progress Notes (Signed)
Office: [phone]  /  Fax: [phone]   HPI:   Chief Complaint: OBESITY [name] is here to discuss her progress with her obesity treatment plan. She is on the  follow the Category 3 plan and is following her eating plan approximately 95 % of the time. She states she is exercising 0 minutes 0 times per week. Brandi Lester did well with weight loss on the Cat 3 plan, her hunger was controlled, she had some challenges with planning some of her meals but tried to portion control and make smart choices.   Her weight is (!) 316 lb (143.3 kg) today and has had a weight loss of 9 pounds over a period of 2 weeks since her last visit. She has lost 9 lbs since starting treatment with [name].  Vitamin D deficiency Brandi Lester has a diagnosis of vitamin D deficiency. She is not currently taking vit D and denies nausea, vomiting or muscle weakness.  Diabetes II Brandi Lester has a diagnosis of diabetes type II. Patient is on Metformin, had a GI upset with Victoza in the past. Her A1C has improved from 6.6 to 6.2.   Brandi Lester states patient does not check sugars and denies any hypoglycemic episodes. Last A1c was Hemoglobin A1C Latest Ref Rng & Units 05/23/2017 03/15/2017 11/06/2016 07/18/2016  HGBA1C 4.8 - 5.6 % 6.2(H) 6.6(H) 6.3 6.2  Some recent data might be hidden    She has been working on intensive lifestyle modifications including diet, exercise, and weight loss to help control her blood glucose levels.  ALLERGIES: Allergies  Allergen Reactions  . Penicillins Itching and Swelling    Has patient had a PCN reaction causing immediate rash, facial/tongue/throat swelling, SOB or lightheadedness with hypotension: yes Has patient had a PCN reaction causing severe rash involving mucus membranes or skin necrosis: no Has patient had a PCN reaction that required hospitalization no Has patient had a PCN reaction occurring within the last 10 years: yes If all of the above answers are "NO", then may proceed with Cephalosporin  use.   Brandi Lester [Liraglutide]     Vomiting    MEDICATIONS: Current Outpatient Prescriptions on File Prior to Visit  Medication Sig Dispense Refill  . amLODipine (NORVASC) 10 MG tablet amlodipine 10 mg tablet    . aspirin EC 81 MG tablet Take 1 tablet (81 mg total) by mouth daily.    [name] azelastine (ASTELIN) 0.1 % nasal spray Place 2 sprays into both nostrils 2 (two) times daily. Use in each nostril as directed 90 mL 1  . Cyanocobalamin (B-12) 1000 MCG CAPS Take by mouth daily.    [name] EPINEPHrine (EPIPEN 2-PAK) 0.3 mg/0.3 mL IJ SOAJ injection Use as directed for severe allergic reaction 2 Device 1  . fexofenadine (ALLEGRA ALLERGY) 180 MG tablet Take 1 tablet (180 mg total) by mouth daily. 301 tablet 1  . fluticasone (FLONASE) 50 MCG/ACT nasal spray Place 2 sprays into both nostrils daily. (Patient taking differently: Place 2 sprays into both nostrils daily as needed for allergies. ) 16 g 11  . glucose blood test strip OneTouch Ultra Test strips    . ibuprofen (ADVIL,MOTRIN) 600 MG tablet Take 1 tablet (600 mg total) by mouth every 6 (six) hours as needed. 90 tablet 0  . losartan-hydrochlorothiazide (HYZAAR) 100-25 MG tablet Take 1 tablet by mouth daily. 90 tablet 1  . metFORMIN (GLUCOPHAGE) 500 MG tablet Take 1 tablet (500 mg total) by mouth daily with breakfast. 90 tablet 1  . montelukast (SINGULAIR) 10 MG tablet  Take 1 tablet (10 mg total) by mouth at bedtime. 90 tablet 1  . Omega-3 Fatty Acids (FISH OIL PO) Take 1 capsule by mouth daily.    Brandi Lester 62V MISC OneTouch Delica Lester 33 gauge    . pantoprazole (PROTONIX) 40 MG tablet Take 1 tablet (40 mg total) by mouth daily. 90 tablet 1  . potassium chloride SA (K-DUR,KLOR-CON) 20 MEQ tablet Take 1 tablet (20 mEq total) by mouth 4 (four) times daily. 120 tablet 3  . Probiotic Product (PROBIOTIC-10 PO) Take by mouth daily.    . TURMERIC PO Take 1 capsule by mouth daily.     No current facility-administered medications on  file prior to visit.     PAST MEDICAL HISTORY: Past Medical History:  Diagnosis Date  . ADD (attention deficit disorder)   . Allergy    allergic rhinitis  . Anemia 07/25/2013  . Back pain   . Costochondritis 02/20/2015  . Diabetes mellitus    Type II   previously 3 years ago - no longer on meds   . Dyslipidemia 07/25/2013  . Fungus infection 10/13   Left great toe  . GERD (gastroesophageal reflux disease)   . Hypertension   . IBS (irritable bowel syndrome)   . Ingrown nail 10/13   right foot next to the last toe  . Insomnia 04/20/2013  . Joint pain   . Lactose intolerance   . Leg edema   . Obesity   . Pedal edema 02/20/2015  . Prediabetes   . Preventative health care 09/25/2015  . PVC (premature ventricular contraction)     PAST SURGICAL HISTORY: Past Surgical History:  Procedure Laterality Date  . HYSTEROSCOPY N/A 10/04/2015   Procedure: HYSTEROSCOPY with Removal IUD;  Surgeon: Vanessa Kick, MD;  Location: [facility];  Service: Gynecology;  Laterality: N/A;  . lasik     eye surgery  . WISDOM TOOTH EXTRACTION      SOCIAL HISTORY: Social History  Substance Use Topics  . Smoking status: Former Smoker    Packs/day: 1.00    Years: 15.00    Quit date: 09/03/2001  . Smokeless tobacco: Never Used  . Alcohol use Yes     Comment: rare    FAMILY HISTORY: Family History  Problem Relation Age of Onset  . Heart attack Mother 30  . Hypertension Mother   . Heart disease Mother   . Obesity Mother   . Cancer Father        prostate  . Heart attack Sister 41  . Hypertension Other     ROS: Review of Systems  Constitutional: Positive for weight loss.  All other systems reviewed and are negative.   PHYSICAL EXAM: Blood pressure 126/79, pulse 95, temperature 98.7 F (37.1 C), temperature source Oral, height 5\' 9"  (1.753 m), weight (!) 316 lb (143.3 kg), SpO2 98 %. Body mass index is 46.67 kg/m. Physical Exam  Constitutional: She is oriented to person, place, and time. She  appears well-developed and well-nourished.  HENT:  Head: Normocephalic.  Eyes: EOM are normal.  Neck: Normal range of motion.  Pulmonary/Chest: Effort normal.  Musculoskeletal: Normal range of motion.  Neurological: She is alert and oriented to person, place, and time.  Skin: Skin is warm and dry.  Psychiatric: She has a normal mood and affect. Her behavior is normal.  Vitals reviewed.   RECENT LABS AND TESTS: BMET    Component Value Date/Time   NA 139 05/23/2017 1025   K 3.6 05/23/2017  1025   CL 100 05/23/2017 1025   CO2 25 05/23/2017 1025   GLUCOSE 100 (H) 05/23/2017 1025   GLUCOSE 107 (H) 03/15/2017 1140   BUN 9 05/23/2017 1025   CREATININE 0.71 05/23/2017 1025   CREATININE 0.72 02/03/2014 1648   CALCIUM 9.2 05/23/2017 1025   GFRNONAA 100 05/23/2017 1025   GFRAA 116 05/23/2017 1025   Lab Results  Component Value Date   HGBA1C 6.2 (H) 05/23/2017   HGBA1C 6.6 (H) 03/15/2017   HGBA1C 6.3 11/06/2016   HGBA1C 6.2 07/18/2016   HGBA1C 6.0 03/15/2016   Lab Results  Component Value Date   INSULIN 31.1 (H) 05/23/2017   CBC    Component Value Date/Time   WBC 7.0 05/23/2017 1025   WBC 6.7 03/15/2017 1140   RBC 4.46 05/23/2017 1025   RBC 4.49 03/15/2017 1140   HGB 12.1 05/23/2017 1025   HCT 36.6 05/23/2017 1025   PLT 273.0 03/15/2017 1140   MCV 82 05/23/2017 1025   MCH 27.1 05/23/2017 1025   MCH 28.2 02/03/2014 1648   MCHC 33.1 05/23/2017 1025   MCHC 34.0 03/15/2017 1140   RDW 14.9 05/23/2017 1025   LYMPHSABS 1.7 05/23/2017 1025   MONOABS 0.4 06/09/2014 0843   EOSABS 0.1 05/23/2017 1025   BASOSABS 0.0 05/23/2017 1025   Iron/TIBC/Ferritin/ %Sat No results found for: IRON, TIBC, FERRITIN, IRONPCTSAT Lipid Panel     Component Value Date/Time   CHOL 139 05/23/2017 1025   TRIG 113 05/23/2017 1025   HDL 34 (L) 05/23/2017 1025   CHOLHDL 4 03/15/2017 1140   VLDL 26.6 03/15/2017 1140   LDLCALC 82 05/23/2017 1025   Hepatic Function Panel     Component Value  Date/Time   PROT 7.3 05/23/2017 1025   ALBUMIN 4.3 05/23/2017 1025   AST 8 05/23/2017 1025   ALT 12 05/23/2017 1025   ALKPHOS 85 05/23/2017 1025   BILITOT 0.2 05/23/2017 1025   BILIDIR 0.0 06/09/2014 0843   IBILI 0.1 (L) 02/03/2014 1648      Component Value Date/Time   TSH 1.710 05/23/2017 1025   TSH 1.26 03/15/2017 1140   TSH 1.68 11/06/2016 1438    ASSESSMENT AND PLAN: Diabetes mellitus type II, non insulin dependent (HCC)  Vitamin D deficiency - Plan: Vitamin D, Ergocalciferol, (DRISDOL) 50000 units CAPS capsule  Class 3 severe obesity with serious comorbidity and body mass index (BMI) of 45.0 to 49.9 in adult, unspecified obesity type (HCC)  PLAN: Vitamin D Deficiency Brandi Lester was informed that low vitamin D levels contributes to fatigue and are associated with obesity, breast, and colon cancer. She agrees to continue to take prescription Vit D @50 ,000 IU every week and will follow up for routine testing of vitamin D, at least 2-3 times per year. She was informed of the risk of over-replacement of vitamin D and agrees to not increase her dose unless he discusses this with [name] first.  Diabetes II Brandi Lester has been given extensive diabetes education by myself today including ideal fasting and post-prandial blood glucose readings, individual ideal HgA1c goals  and hypoglycemia prevention. We discussed the importance of good blood sugar control to decrease the likelihood of diabetic complications such as nephropathy, neuropathy, limb loss, blindness, coronary artery disease, and death. We discussed the importance of intensive lifestyle modification including diet, exercise and weight loss as the first line treatment for diabetes. Brandi Lester agrees to continue her diabetes medications and will follow up at the agreed upon time. Will recheck labs in 3 months.  Obesity Brandi Lester is currently in the action stage of change. As such, her goal is to continue with weight loss efforts She has  agreed to follow the Category 3 plan Brandi Lester has been instructed to work up to a goal of 150 minutes of combined cardio and strengthening exercise per week for weight loss and overall health benefits. We discussed the following Behavioral Modification Stratagies today: increasing lean protein intake and decreasing simple carbohydrates   Brandi Lester has agreed to follow up with our clinic in 2 weeks. She was informed of the importance of frequent follow up visits to maximize her success with intensive lifestyle modifications for her multiple health conditions.  I, April Moore, am acting as Location manager for Dennard Nip, MD  I have reviewed the above documentation for accuracy and completeness, and I agree with the above. -Dennard Nip, MD   Office: [phone]  /  Fax: [phone]  OBESITY BEHAVIORAL INTERVENTION VISIT  Today's visit was # 2 out of 47.  Starting weight: 325 Starting date: 05/23/17 Today's weight : Weight: (!) 316 lb (143.3 kg)  Today's date: 06/11/2017 Total lbs lost to date: 9 (Patients must lose 7 lbs in the first 6 months to continue with counseling)   ASK: We discussed the diagnosis of obesity with Brandi Lester today and Brandi Lester agreed to give [name] permission to discuss obesity behavioral modification therapy today.  ASSESS: Brandi Lester has the diagnosis of obesity and her BMI today is @TBMI @ Brandi Lester is in the action stage of change   ADVISE: Brandi Lester was educated on the multiple health risks of obesity as well as the benefit of weight loss to improve her health. She was advised of the need for long term treatment and the importance of lifestyle modifications.  AGREE: Multiple dietary modification options and treatment options were discussed and  Brandi Lester agreed to follow the Category 3 plan + breakfast options We discussed the following Behavioral Modification Stratagies today: increasing lean protein intake and decreasing simple carbohydrates

## 2017-06-14 MED FILL — LOSARTAN-HCTZ 100-25 MG TAB: 100-25 | 90 days supply | Qty: 90 | Fill #1

## 2017-06-14 MED FILL — metFORMIN HCL 500 MG TABS: 500 | 90 days supply | Qty: 90 | Fill #1

## 2017-06-14 MED FILL — PANTOPRAZOLE SOD DR 40 MG T: 40 | 90 days supply | Qty: 90 | Fill #1

## 2017-06-18 ENCOUNTER — Ambulatory Visit (INDEPENDENT_AMBULATORY_CARE_PROVIDER_SITE_OTHER): Payer: BC Managed Care – PPO | Admitting: Physician Assistant

## 2017-06-18 ENCOUNTER — Ambulatory Visit (INDEPENDENT_AMBULATORY_CARE_PROVIDER_SITE_OTHER): Payer: BC Managed Care – PPO | Admitting: *Deleted

## 2017-06-18 VITALS — BP 132/75 | HR 89 | Temp 98.6°F | Ht 69.0 in | Wt 314.0 lb

## 2017-06-18 DIAGNOSIS — J309 Allergic rhinitis, unspecified: Secondary | ICD-10-CM

## 2017-06-18 DIAGNOSIS — E119 Type 2 diabetes mellitus without complications: Secondary | ICD-10-CM | POA: Diagnosis not present

## 2017-06-18 DIAGNOSIS — Z6841 Body Mass Index (BMI) 40.0 and over, adult: Secondary | ICD-10-CM
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
## 2017-07-02 ENCOUNTER — Ambulatory Visit (INDEPENDENT_AMBULATORY_CARE_PROVIDER_SITE_OTHER): Payer: BC Managed Care – PPO | Admitting: Physician Assistant

## 2017-07-02 VITALS — BP 118/79 | HR 86 | Temp 98.8°F | Ht 69.0 in | Wt 307.0 lb

## 2017-07-02 DIAGNOSIS — E559 Vitamin D deficiency, unspecified: Secondary | ICD-10-CM | POA: Diagnosis not present

## 2017-07-02 DIAGNOSIS — Z6841 Body Mass Index (BMI) 40.0 and over, adult: Secondary | ICD-10-CM

## 2017-07-02 DIAGNOSIS — E119 Type 2 diabetes mellitus without complications: Secondary | ICD-10-CM | POA: Insufficient documentation

## 2017-07-02 MED ORDER — VITAMIN D (ERGOCALCIFEROL) 1.25 MG (50000 UNIT) PO CAPS: 50000.0000 [IU] | ORAL_CAPSULE | ORAL | 0 refills | Status: DC

## 2017-07-02 MED FILL — VIT D2 1.25 MG (50,000 UNIT: 1.25 MG | 28 days supply | Qty: 4 | Fill #0

## 2017-07-02 NOTE — Progress Notes (Signed)
Office: [phone]  /  Fax: [phone]   HPI:   Chief Complaint: OBESITY Brandi Lester is here to discuss her progress with her obesity treatment plan. She is on the Category 3 plan and is following her eating plan approximately 95 % of the time. She states she is walking for 30 minutes 3 times per week. Brandi Lester continues to do well with weight loss. She has been eating the required daily protein and her hunger is well controlled. She states she would like more variety at dinner. Her weight is (!) 307 lb (139.3 kg) today and has had a weight loss of 7 pounds over a period of 2 weeks since her last visit. She has lost 18 lbs since starting treatment with [name].  Vitamin D deficiency Brandi Lester has a diagnosis of vitamin D deficiency. She is currently taking vit D and denies nausea, vomiting or muscle weakness.  Diabetes II Brandi Lester has a diagnosis of diabetes type II. Brandi Lester states she is not checking daily BGs at home and denies any hypoglycemic episodes. She has been working on intensive lifestyle modifications including diet, exercise, and weight loss to help control her blood glucose levels.   ALLERGIES: Allergies  Allergen Reactions  . Penicillins Itching and Swelling    Has patient had a PCN reaction causing immediate rash, facial/tongue/throat swelling, SOB or lightheadedness with hypotension: yes Has patient had a PCN reaction causing severe rash involving mucus membranes or skin necrosis: no Has patient had a PCN reaction that required hospitalization no Has patient had a PCN reaction occurring within the last 10 years: yes If all of the above answers are "NO", then may proceed with Cephalosporin use.   Brandi Lester [Liraglutide]     Vomiting    MEDICATIONS: Current Outpatient Prescriptions on File Prior to Visit  Medication Sig Dispense Refill  . amLODipine (NORVASC) 10 MG tablet amlodipine 10 mg tablet    . aspirin EC 81 MG tablet Take 1 tablet (81 mg total) by mouth daily.      [name] azelastine (ASTELIN) 0.1 % nasal spray Place 2 sprays into both nostrils 2 (two) times daily. Use in each nostril as directed 90 mL 1  . Cyanocobalamin (B-12) 1000 MCG CAPS Take by mouth daily.    [name] EPINEPHrine (EPIPEN 2-PAK) 0.3 mg/0.3 mL IJ SOAJ injection Use as directed for severe allergic reaction 2 Device 1  . fexofenadine (ALLEGRA ALLERGY) 180 MG tablet Take 1 tablet (180 mg total) by mouth daily. 301 tablet 1  . fluticasone (FLONASE) 50 MCG/ACT nasal spray Place 2 sprays into both nostrils daily. (Patient taking differently: Place 2 sprays into both nostrils daily as needed for allergies. ) 16 g 11  . glucose blood test strip OneTouch Ultra Test strips    . ibuprofen (ADVIL,MOTRIN) 600 MG tablet Take 1 tablet (600 mg total) by mouth every 6 (six) hours as needed. 90 tablet 0  . losartan-hydrochlorothiazide (HYZAAR) 100-25 MG tablet Take 1 tablet by mouth daily. 90 tablet 1  . metFORMIN (GLUCOPHAGE) 500 MG tablet Take 1 tablet (500 mg total) by mouth daily with breakfast. 90 tablet 1  . montelukast (SINGULAIR) 10 MG tablet Take 1 tablet (10 mg total) by mouth at bedtime. 90 tablet 1  . Omega-3 Fatty Acids (FISH OIL PO) Take 1 capsule by mouth daily.    [name] DELICA LANCETS 52D MISC OneTouch Delica Lancets 33 gauge    . pantoprazole (PROTONIX) 40 MG tablet Take 1 tablet (40 mg total) by mouth daily. Rote  tablet 1  . potassium chloride SA (K-DUR,KLOR-CON) 20 MEQ tablet Take 1 tablet (20 mEq total) by mouth 4 (four) times daily. 120 tablet 3  . Probiotic Product (PROBIOTIC-10 PO) Take by mouth daily.    . TURMERIC PO Take 1 capsule by mouth daily.    . Vitamin D, Ergocalciferol, (DRISDOL) 50000 units CAPS capsule Take 1 capsule (50,000 Units total) by mouth every 7 (seven) days. 4 capsule 0   No current facility-administered medications on file prior to visit.     PAST MEDICAL HISTORY: Past Medical History:  Diagnosis Date  . ADD (attention deficit disorder)   . Allergy     allergic rhinitis  . Anemia 07/25/2013  . Back pain   . Costochondritis 02/20/2015  . Diabetes mellitus    Type II   previously 3 years ago - no longer on meds   . Dyslipidemia 07/25/2013  . Fungus infection 10/13   Left great toe  . GERD (gastroesophageal reflux disease)   . Hypertension   . IBS (irritable bowel syndrome)   . Ingrown nail 10/13   right foot next to the last toe  . Insomnia 04/20/2013  . Joint pain   . Lactose intolerance   . Leg edema   . Obesity   . Pedal edema 02/20/2015  . Prediabetes   . Preventative health care 09/25/2015  . PVC (premature ventricular contraction)     PAST SURGICAL HISTORY: Past Surgical History:  Procedure Laterality Date  . HYSTEROSCOPY N/A 10/04/2015   Procedure: HYSTEROSCOPY with Removal IUD;  Surgeon: Vanessa Kick, MD;  Location: [facility];  Service: Gynecology;  Laterality: N/A;  . lasik     eye surgery  . WISDOM TOOTH EXTRACTION      SOCIAL HISTORY: Social History  Substance Use Topics  . Smoking status: Former Smoker    Packs/day: 1.00    Years: 15.00    Quit date: 09/03/2001  . Smokeless tobacco: Never Used  . Alcohol use Yes     Comment: rare    FAMILY HISTORY: Family History  Problem Relation Age of Onset  . Heart attack Mother 21  . Hypertension Mother   . Heart disease Mother   . Obesity Mother   . Cancer Father        prostate  . Heart attack Sister 69  . Hypertension Other     ROS: Review of Systems  Constitutional: Positive for weight loss.  Gastrointestinal: Negative for nausea and vomiting.  Musculoskeletal:       Negative muscle weakness  Endo/Heme/Allergies:       Negative hypoglycemia    PHYSICAL EXAM: Blood pressure 118/79, pulse 86, temperature 98.8 F (37.1 C), temperature source Oral, height 5\' 9"  (1.753 m), weight (!) 307 lb (139.3 kg), SpO2 99 %. Body mass index is 45.34 kg/m. Physical Exam  Constitutional: She appears well-developed and well-nourished.  Cardiovascular: Normal rate.    Pulmonary/Chest: Effort normal.  Musculoskeletal: Normal range of motion.  Neurological: She is alert.  Skin: Skin is warm and dry.  Psychiatric: She has a normal mood and affect.    RECENT LABS AND TESTS: BMET    Component Value Date/Time   NA 139 05/23/2017 1025   K 3.6 05/23/2017 1025   CL 100 05/23/2017 1025   CO2 25 05/23/2017 1025   GLUCOSE 100 (H) 05/23/2017 1025   GLUCOSE 107 (H) 03/15/2017 1140   BUN 9 05/23/2017 1025   CREATININE 0.71 05/23/2017 1025   CREATININE 0.72 02/03/2014 1648  CALCIUM 9.2 05/23/2017 1025   GFRNONAA 100 05/23/2017 1025   GFRAA 116 05/23/2017 1025   Lab Results  Component Value Date   HGBA1C 6.2 (H) 05/23/2017   HGBA1C 6.6 (H) 03/15/2017   HGBA1C 6.3 11/06/2016   HGBA1C 6.2 07/18/2016   HGBA1C 6.0 03/15/2016   Lab Results  Component Value Date   INSULIN 31.1 (H) 05/23/2017   CBC    Component Value Date/Time   WBC 7.0 05/23/2017 1025   WBC 6.7 03/15/2017 1140   RBC 4.46 05/23/2017 1025   RBC 4.49 03/15/2017 1140   HGB 12.1 05/23/2017 1025   HCT 36.6 05/23/2017 1025   PLT 273.0 03/15/2017 1140   MCV 82 05/23/2017 1025   MCH 27.1 05/23/2017 1025   MCH 28.2 02/03/2014 1648   MCHC 33.1 05/23/2017 1025   MCHC 34.0 03/15/2017 1140   RDW 14.9 05/23/2017 1025   LYMPHSABS 1.7 05/23/2017 1025   MONOABS 0.4 06/09/2014 0843   EOSABS 0.1 05/23/2017 1025   BASOSABS 0.0 05/23/2017 1025   Iron/TIBC/Ferritin/ %Sat No results found for: IRON, TIBC, FERRITIN, IRONPCTSAT Lipid Panel     Component Value Date/Time   CHOL 139 05/23/2017 1025   TRIG 113 05/23/2017 1025   HDL 34 (L) 05/23/2017 1025   CHOLHDL 4 03/15/2017 1140   VLDL 26.6 03/15/2017 1140   LDLCALC 82 05/23/2017 1025   Hepatic Function Panel     Component Value Date/Time   PROT 7.3 05/23/2017 1025   ALBUMIN 4.3 05/23/2017 1025   AST 8 05/23/2017 1025   ALT 12 05/23/2017 1025   ALKPHOS 85 05/23/2017 1025   BILITOT 0.2 05/23/2017 1025   BILIDIR 0.0 06/09/2014 0843    IBILI 0.1 (L) 02/03/2014 1648      Component Value Date/Time   TSH 1.710 05/23/2017 1025   TSH 1.26 03/15/2017 1140   TSH 1.68 11/06/2016 1438    ASSESSMENT AND PLAN: Vitamin D deficiency - Plan: Vitamin D, Ergocalciferol, (DRISDOL) 50000 units CAPS capsule  Type 2 diabetes mellitus without complication, without long-term current use of insulin (HCC)  Class 3 severe obesity with serious comorbidity and body mass index (BMI) of 45.0 to 49.9 in adult, unspecified obesity type ([place])  PLAN:  Vitamin D Deficiency Buelah was informed that low vitamin D levels contributes to fatigue and are associated with obesity, breast, and colon cancer. She agrees to continue to take prescription Vit D @50 ,000 IU every week #4 with no refills and will follow up for routine testing of vitamin D, at least 2-3 times per year. She was informed of the risk of over-replacement of vitamin D and agrees to not increase her dose unless he discusses this with [name] first. Rosann agrees to follow up with our clinic in 2 weeks.  Diabetes II Myrta has been given extensive diabetes education by myself today including ideal fasting and post-prandial blood glucose readings, individual ideal Hgb A1c goals  and hypoglycemia prevention. We discussed the importance of good blood sugar control to decrease the likelihood of diabetic complications such as nephropathy, neuropathy, limb loss, blindness, coronary artery disease, and death. We discussed the importance of intensive lifestyle modification including diet, exercise and weight loss as the first line treatment for diabetes. Lashunda agrees to continue her diabetes medications and will follow up at the agreed upon time.  Obesity Jennesis is currently in the action stage of change. As such, her goal is to continue with weight loss efforts She has agreed to keep a food journal with 500  to 600 calories and 40 grams of protein at supper daily and follow the Category 3 plan Tatumn  has been instructed to work up to a goal of 150 minutes of combined cardio and strengthening exercise per week for weight loss and overall health benefits. We discussed the following Behavioral Modification Strategies today: keep a strict food journal and increasing lean protein intake  Persia has agreed to follow up with our clinic in 2 weeks. She was informed of the importance of frequent follow up visits to maximize her success with intensive lifestyle modifications for her multiple health conditions.  I, Doreene Nest, am acting as transcriptionist for Lacy Duverney, PA-C  I have reviewed the above documentation for accuracy and completeness, and I agree with the above. -Lacy Duverney, PA-C  I have reviewed the above note and agree with the plan. -Dennard Nip, MD   OBESITY BEHAVIORAL INTERVENTION VISIT  Today's visit was # 4 out of 22.  Starting weight: 325 lbs Starting date: 05/23/17 Today's weight : 307 lbs Today's date: 07/02/2017 Total lbs lost to date: 27 (Patients must lose 7 lbs in the first 6 months to continue with counseling)   ASK: We discussed the diagnosis of obesity with [name] today and Chamari agreed to give [name] permission to discuss obesity behavioral modification therapy today.  ASSESS: Davisha has the diagnosis of obesity and her BMI today is 45.32 Donatella is in the action stage of change   ADVISE: Allyson was educated on the multiple health risks of obesity as well as the benefit of weight loss to improve her health. She was advised of the need for long term treatment and the importance of lifestyle modifications.  AGREE: Multiple dietary modification options and treatment options were discussed and  Milda agreed to keep a food journal with 500 to 600 calories and 40 grams of protein at supper daily and follow the Category 3 plan We discussed the following Behavioral Modification Strategies today: increasing lean protein intake and keep a strict  food journal

## 2017-07-03 MED FILL — AMLODIPINE BESYLATE 10 MG T: 10 | 90 days supply | Qty: 90 | Fill #1

## 2017-07-15 ENCOUNTER — Ambulatory Visit: Payer: BC Managed Care – PPO | Admitting: Family Medicine

## 2017-07-15 ENCOUNTER — Telehealth: Payer: Self-pay | Admitting: Family Medicine

## 2017-07-15 ENCOUNTER — Ambulatory Visit
Admission: RE | Admit: 2017-07-15 | Discharge: 2017-07-15 | Disposition: A | Discharge status: Home / Self Care | Payer: BC Managed Care – PPO | Source: Ambulatory Visit | Attending: Obstetrics and Gynecology | Admitting: Obstetrics and Gynecology

## 2017-07-15 DIAGNOSIS — E876 Hypokalemia: Secondary | ICD-10-CM

## 2017-07-15 DIAGNOSIS — T7840XA Allergy, unspecified, initial encounter: Secondary | ICD-10-CM

## 2017-07-15 DIAGNOSIS — I10 Essential (primary) hypertension: Secondary | ICD-10-CM

## 2017-07-15 DIAGNOSIS — Z1231 Encounter for screening mammogram for malignant neoplasm of breast: Secondary | ICD-10-CM

## 2017-07-15 MED FILL — POTASSIUM CL ER 20 MEQ TABL: 20 | 30 days supply | Qty: 120 | Fill #1

## 2017-07-15 NOTE — Telephone Encounter (Signed)
Caller name: Relation to XB:OERQ Call back number:[phone] [facility]  Reason for call: pt is needing refill on rx potassium chloride SA (K-DUR,KLOR-CON) 20 mg, pt states she only has 1 pill for today, please send to pharmacy asap.

## 2017-07-16 ENCOUNTER — Encounter: Payer: Self-pay | Admitting: Family Medicine

## 2017-07-16 MED ORDER — POTASSIUM CHLORIDE CRYS ER 20 MEQ PO TBCR: 20.0000 meq | EXTENDED_RELEASE_TABLET | Freq: Four times a day (QID) | ORAL | 3 refills | Status: DC

## 2017-07-16 NOTE — Telephone Encounter (Signed)
rx sent

## 2017-07-18 ENCOUNTER — Ambulatory Visit (INDEPENDENT_AMBULATORY_CARE_PROVIDER_SITE_OTHER): Payer: BC Managed Care – PPO | Admitting: Physician Assistant

## 2017-07-18 ENCOUNTER — Ambulatory Visit (INDEPENDENT_AMBULATORY_CARE_PROVIDER_SITE_OTHER): Payer: BC Managed Care – PPO | Admitting: *Deleted

## 2017-07-18 VITALS — BP 132/81 | HR 89 | Temp 98.3°F | Ht 69.0 in | Wt 305.0 lb

## 2017-07-18 DIAGNOSIS — J309 Allergic rhinitis, unspecified: Secondary | ICD-10-CM

## 2017-07-18 DIAGNOSIS — Z6841 Body Mass Index (BMI) 40.0 and over, adult: Secondary | ICD-10-CM | POA: Diagnosis not present

## 2017-07-18 DIAGNOSIS — M5441 Lumbago with sciatica, right side: Secondary | ICD-10-CM

## 2017-07-18 DIAGNOSIS — E559 Vitamin D deficiency, unspecified: Secondary | ICD-10-CM

## 2017-07-18 DIAGNOSIS — G8929 Other chronic pain: Secondary | ICD-10-CM | POA: Diagnosis not present

## 2017-07-18 MED ORDER — ACETAMINOPHEN ER 650 MG PO TBCR: 650.0000 mg | EXTENDED_RELEASE_TABLET | Freq: Three times a day (TID) | ORAL | Status: AC | PRN

## 2017-07-18 MED ORDER — VITAMIN D (ERGOCALCIFEROL) 1.25 MG (50000 UNIT) PO CAPS: 50000.0000 [IU] | ORAL_CAPSULE | ORAL | 0 refills | Status: DC

## 2017-07-18 NOTE — Progress Notes (Signed)
Office: [phone]  /  Fax: [phone]   HPI:   Chief Complaint: OBESITY Brandi Lester is here to discuss her progress with her obesity treatment plan. She is on the Category 3 plan and is following her eating plan approximately 85 % of the time. She states she is exercising 0 minutes 0 times per week. Brandi Lester continues to do well with weight loss. She would like more variety with her meals. Her weight is (!) 305 lb (138.3 kg) today and has had a weight loss of 2 pounds over a period of 2 weeks since her last visit. She has lost 20 lbs since starting treatment with [name].  Vitamin D deficiency Brandi Lester has a diagnosis of vitamin D deficiency. She is currently taking vit D and denies nausea, vomiting or muscle weakness.  Chronic right-sided low back pain with right-sided Sciatica Brandi Lester complains of chronic low back pain on right side that radiates down her right leg. She has associated occasional numbness of her R toe.  She denies any injury or fall. She is scheduled to see her PCP next week for further evaluation and possible imaging.  ALLERGIES: Allergies  Allergen Reactions   Penicillins Itching and Swelling    Has patient had a PCN reaction causing immediate rash, facial/tongue/throat swelling, SOB or lightheadedness with hypotension: yes Has patient had a PCN reaction causing severe rash involving mucus membranes or skin necrosis: no Has patient had a PCN reaction that required hospitalization no Has patient had a PCN reaction occurring within the last 10 years: yes If all of the above answers are "NO", then may proceed with Cephalosporin use.    Victoza [Liraglutide]     Vomiting    MEDICATIONS: Current Outpatient Medications on File Prior to Visit  Medication Sig Dispense Refill   amLODipine (NORVASC) 10 MG tablet amlodipine 10 mg tablet     aspirin EC 81 MG tablet Take 1 tablet (81 mg total) by mouth daily.     azelastine (ASTELIN) 0.1 % nasal spray Place 2 sprays into  both nostrils 2 (two) times daily. Use in each nostril as directed 90 mL 1   Cyanocobalamin (B-12) 1000 MCG CAPS Take by mouth daily.     EPINEPHrine (EPIPEN 2-PAK) 0.3 mg/0.3 mL IJ SOAJ injection Use as directed for severe allergic reaction 2 Device 1   fexofenadine (ALLEGRA ALLERGY) 180 MG tablet Take 1 tablet (180 mg total) by mouth daily. 301 tablet 1   fluticasone (FLONASE) 50 MCG/ACT nasal spray Place 2 sprays into both nostrils daily. (Patient taking differently: Place 2 sprays into both nostrils daily as needed for allergies. ) 16 g 11   glucose blood test strip OneTouch Ultra Test strips     ibuprofen (ADVIL,MOTRIN) 600 MG tablet Take 1 tablet (600 mg total) by mouth every 6 (six) hours as needed. 90 tablet 0   losartan-hydrochlorothiazide (HYZAAR) 100-25 MG tablet Take 1 tablet by mouth daily. 90 tablet 1   metFORMIN (GLUCOPHAGE) 500 MG tablet Take 1 tablet (500 mg total) by mouth daily with breakfast. 90 tablet 1   montelukast (SINGULAIR) 10 MG tablet Take 1 tablet (10 mg total) by mouth at bedtime. 90 tablet 1   Omega-3 Fatty Acids (FISH OIL PO) Take 1 capsule by mouth daily.     ONETOUCH DELICA LANCETS 10C MISC OneTouch Delica Lancets 33 gauge     pantoprazole (PROTONIX) 40 MG tablet Take 1 tablet (40 mg total) by mouth daily. 90 tablet 1   potassium chloride SA (K-DUR,KLOR-CON) 20  MEQ tablet Take 1 tablet (20 mEq total) 4 (four) times daily by mouth. 120 tablet 3   Probiotic Product (PROBIOTIC-10 PO) Take by mouth daily.     TURMERIC PO Take 1 capsule by mouth daily.     Vitamin D, Ergocalciferol, (DRISDOL) 50000 units CAPS capsule Take 1 capsule (50,000 Units total) by mouth every 7 (seven) days. 4 capsule 0   No current facility-administered medications on file prior to visit.     PAST MEDICAL HISTORY: Past Medical History:  Diagnosis Date   ADD (attention deficit disorder)    Allergy    allergic rhinitis   Anemia 07/25/2013   Back pain     Costochondritis 02/20/2015   Diabetes mellitus    Type II   previously 3 years ago - no longer on meds    Dyslipidemia 07/25/2013   Fungus infection 10/13   Left great toe   GERD (gastroesophageal reflux disease)    Hypertension    IBS (irritable bowel syndrome)    Ingrown nail 10/13   right foot next to the last toe   Insomnia 04/20/2013   Joint pain    Lactose intolerance    Leg edema    Obesity    Pedal edema 02/20/2015   Prediabetes    Preventative health care 09/25/2015   PVC (premature ventricular contraction)     PAST SURGICAL HISTORY: Past Surgical History:  Procedure Laterality Date   HYSTEROSCOPY N/A 10/04/2015   Procedure: HYSTEROSCOPY with Removal IUD;  Surgeon: Vanessa Kick, MD;  Location: [facility];  Service: Gynecology;  Laterality: N/A;   lasik     eye surgery   WISDOM TOOTH EXTRACTION      SOCIAL HISTORY: Social History   Tobacco Use   Smoking status: Former Smoker    Packs/day: 1.00    Years: 15.00    Pack years: 15.00    Last attempt to quit: 09/03/2001    Years since quitting: 15.8   Smokeless tobacco: Never Used  Substance Use Topics   Alcohol use: Yes    Comment: rare   Drug use: No    FAMILY HISTORY: Family History  Problem Relation Age of Onset   Heart attack Mother 15   Hypertension Mother    Heart disease Mother    Obesity Mother    Cancer Father        prostate   Heart attack Sister 3   Hypertension Other     ROS: Review of Systems  Constitutional: Positive for weight loss.  Gastrointestinal: Negative for nausea and vomiting.  Musculoskeletal: Positive for back pain.       Negative muscle weakness  Neurological:       Numbness of the R toe  Psychiatric/Behavioral: Depression: Chronic.    PHYSICAL EXAM: Blood pressure 132/81, pulse 89, temperature 98.3 F (36.8 C), height 5\' 9"  (1.753 m), weight (!) 305 lb (138.3 kg), SpO2 100 %. Body mass index is 45.04 kg/m. Physical Exam  Constitutional:  She is oriented to person, place, and time. She appears well-developed and well-nourished.  Cardiovascular: Normal rate.  Pulmonary/Chest: Effort normal.  Musculoskeletal: Normal range of motion.  Neurological: She is oriented to person, place, and time.  Skin: Skin is warm and dry.  Psychiatric: She has a normal mood and affect. Her behavior is normal.  Vitals reviewed.   RECENT LABS AND TESTS: BMET    Component Value Date/Time   NA 139 05/23/2017 1025   K 3.6 05/23/2017 1025   CL 100 05/23/2017  1025   CO2 25 05/23/2017 1025   GLUCOSE 100 (H) 05/23/2017 1025   GLUCOSE 107 (H) 03/15/2017 1140   BUN 9 05/23/2017 1025   CREATININE 0.71 05/23/2017 1025   CREATININE 0.72 02/03/2014 1648   CALCIUM 9.2 05/23/2017 1025   GFRNONAA 100 05/23/2017 1025   GFRAA 116 05/23/2017 1025   Lab Results  Component Value Date   HGBA1C 6.2 (H) 05/23/2017   HGBA1C 6.6 (H) 03/15/2017   HGBA1C 6.3 11/06/2016   HGBA1C 6.2 07/18/2016   HGBA1C 6.0 03/15/2016   Lab Results  Component Value Date   INSULIN 31.1 (H) 05/23/2017   CBC    Component Value Date/Time   WBC 7.0 05/23/2017 1025   WBC 6.7 03/15/2017 1140   RBC 4.46 05/23/2017 1025   RBC 4.49 03/15/2017 1140   HGB 12.1 05/23/2017 1025   HCT 36.6 05/23/2017 1025   PLT 273.0 03/15/2017 1140   MCV 82 05/23/2017 1025   MCH 27.1 05/23/2017 1025   MCH 28.2 02/03/2014 1648   MCHC 33.1 05/23/2017 1025   MCHC 34.0 03/15/2017 1140   RDW 14.9 05/23/2017 1025   LYMPHSABS 1.7 05/23/2017 1025   MONOABS 0.4 06/09/2014 0843   EOSABS 0.1 05/23/2017 1025   BASOSABS 0.0 05/23/2017 1025   Iron/TIBC/Ferritin/ %Sat No results found for: IRON, TIBC, FERRITIN, IRONPCTSAT Lipid Panel     Component Value Date/Time   CHOL 139 05/23/2017 1025   TRIG 113 05/23/2017 1025   HDL 34 (L) 05/23/2017 1025   CHOLHDL 4 03/15/2017 1140   VLDL 26.6 03/15/2017 1140   LDLCALC 82 05/23/2017 1025   Hepatic Function Panel     Component Value Date/Time   PROT  7.3 05/23/2017 1025   ALBUMIN 4.3 05/23/2017 1025   AST 8 05/23/2017 1025   ALT 12 05/23/2017 1025   ALKPHOS 85 05/23/2017 1025   BILITOT 0.2 05/23/2017 1025   BILIDIR 0.0 06/09/2014 0843   IBILI 0.1 (L) 02/03/2014 1648      Component Value Date/Time   TSH 1.710 05/23/2017 1025   TSH 1.26 03/15/2017 1140   TSH 1.68 11/06/2016 1438    ASSESSMENT AND PLAN: Vitamin D deficiency - Plan: Vitamin D, Ergocalciferol, (DRISDOL) 50000 units CAPS capsule  Acute right-sided low back pain with right-sided sciatica - Plan: acetaminophen (TYLENOL 8 HOUR) 650 MG CR tablet  Class 3 severe obesity with serious comorbidity and body mass index (BMI) of 45.0 to 49.9 in adult, unspecified obesity type ([place])  PLAN:  Vitamin D Deficiency Brandi Lester was informed that low vitamin D levels contributes to fatigue and are associated with obesity, breast, and colon cancer. She agrees to continue to take prescription Vit D @50 ,000 IU every week #4 with no refills and will follow up for routine testing of vitamin D, at least 2-3 times per year. She was informed of the risk of over-replacement of vitamin D and agrees to not increase her dose unless he discusses this with [name] first. Brandi Lester agrees to follow up with our clinic in 3 weeks.  Chronic right-sided low back pain with right-sided Sciatica Brandi Lester agrees to take Tylenol 650 mg tid an states she takes Aleve BID,  And agrees to follow up with our clinic in 3 weeks.  Obesity Brandi Lester is currently in the action stage of change. As such, her goal is to continue with weight loss efforts She has agreed to keep a food journal with 1500 calories and 95 grams of protein daily Brandi Lester has been instructed to work up  to a goal of 150 minutes of combined cardio and strengthening exercise per week for weight loss and overall health benefits. We discussed the following Behavioral Modification Strategies today: increasing lean protein intake and keep a strict food  journal  Brandi Lester has agreed to follow up with our clinic in 3 weeks. She was informed of the importance of frequent follow up visits to maximize her success with intensive lifestyle modifications for her multiple health conditions.  I, Doreene Nest, am acting as transcriptionist for Lacy Duverney, PA-C  I have reviewed the above documentation for accuracy and completeness, and I agree with the above. -Lacy Duverney, PA-C  I have reviewed the above note and agree with the plan. -Brandi Nip, MD   OBESITY BEHAVIORAL INTERVENTION VISIT  Today's visit was # 5 out of 22.  Starting weight: 325 lbs Starting date: 05/23/17 Today's weight : 305 lbs  Today's date: 07/18/2017 Total lbs lost to date: 20 (Patients must lose 7 lbs in the first 6 months to continue with counseling)   ASK: We discussed the diagnosis of obesity with [name] today and Traniyah agreed to give [name] permission to discuss obesity behavioral modification therapy today.  ASSESS: Soffia has the diagnosis of obesity and her BMI today is 45.02 Monisha is in the action stage of change   ADVISE: Chiquitta was educated on the multiple health risks of obesity as well as the benefit of weight loss to improve her health. She was advised of the need for long term treatment and the importance of lifestyle modifications.  AGREE: Multiple dietary modification options and treatment options were discussed and  Kymora agreed to keep a food journal with 1500 calories and 95 grams of protein daily We discussed the following Behavioral Modification Strategies today: increasing lean protein intake and keep a strict food journal

## 2017-07-23 ENCOUNTER — Ambulatory Visit (INDEPENDENT_AMBULATORY_CARE_PROVIDER_SITE_OTHER): Payer: BC Managed Care – PPO

## 2017-07-23 DIAGNOSIS — Z23 Encounter for immunization: Secondary | ICD-10-CM

## 2017-07-23 MED FILL — VIT D2 1.25 MG (50,000 UNIT: 1.25 MG | 28 days supply | Qty: 4 | Fill #0

## 2017-08-08 ENCOUNTER — Ambulatory Visit (INDEPENDENT_AMBULATORY_CARE_PROVIDER_SITE_OTHER): Payer: BC Managed Care – PPO | Admitting: Physician Assistant

## 2017-08-08 VITALS — BP 117/73 | HR 87 | Temp 98.9°F | Ht 69.0 in | Wt 302.0 lb

## 2017-08-08 DIAGNOSIS — M5441 Lumbago with sciatica, right side: Secondary | ICD-10-CM

## 2017-08-08 DIAGNOSIS — Z6841 Body Mass Index (BMI) 40.0 and over, adult: Secondary | ICD-10-CM

## 2017-08-08 DIAGNOSIS — G8929 Other chronic pain: Secondary | ICD-10-CM

## 2017-08-08 NOTE — Progress Notes (Signed)
Office: [phone]  /  Fax: [phone]   HPI:   Chief Complaint: OBESITY Brandi Lester is here to discuss her progress with her obesity treatment plan. She is on the keep a food journal with 1500 calories and 95 grams of protein daily and is following her eating plan approximately 85 % of the time. She states she is exercising at the gym for 45 minutes 2 times per week. Brandi Lester continues to do well with weight loss. She plans her meals well and incorporates variety into her meals. Her weight is (!) 302 lb (137 kg) today and has had a weight loss of 3 pounds over a period of 3 weeks since her last visit. She has lost 23 lbs since starting treatment with [name].  Chronic right-sided low back pain with right-sided sciatica Brandi Lester notes improvement in back pain with Tylenol. She states she will schedule a follow up with her PCP as advised for further workup and evaluation of her low back pain.  ALLERGIES: Allergies  Allergen Reactions  . Penicillins Itching and Swelling    Has patient had a PCN reaction causing immediate rash, facial/tongue/throat swelling, SOB or lightheadedness with hypotension: yes Has patient had a PCN reaction causing severe rash involving mucus membranes or skin necrosis: no Has patient had a PCN reaction that required hospitalization no Has patient had a PCN reaction occurring within the last 10 years: yes If all of the above answers are "NO", then may proceed with Cephalosporin use.   Brandi Lester [Liraglutide]     Vomiting    MEDICATIONS: Current Outpatient Medications on File Prior to Visit  Medication Sig Dispense Refill  . acetaminophen (TYLENOL 8 HOUR) 650 MG CR tablet Take 1 tablet (650 mg total) every 8 (eight) hours as needed by mouth for pain.    [name] amLODipine (NORVASC) 10 MG tablet amlodipine 10 mg tablet    . aspirin EC 81 MG tablet Take 1 tablet (81 mg total) by mouth daily.    [name] azelastine (ASTELIN) 0.1 % nasal spray Place 2 sprays into both nostrils 2  (two) times daily. Use in each nostril as directed 90 mL 1  . Cyanocobalamin (B-12) 1000 MCG CAPS Take by mouth daily.    [name] EPINEPHrine (EPIPEN 2-PAK) 0.3 mg/0.3 mL IJ SOAJ injection Use as directed for severe allergic reaction 2 Device 1  . fexofenadine (ALLEGRA ALLERGY) 180 MG tablet Take 1 tablet (180 mg total) by mouth daily. 301 tablet 1  . fluticasone (FLONASE) 50 MCG/ACT nasal spray Place 2 sprays into both nostrils daily. (Patient taking differently: Place 2 sprays into both nostrils daily as needed for allergies. ) 16 g 11  . glucose blood test strip OneTouch Ultra Test strips    . ibuprofen (ADVIL,MOTRIN) 600 MG tablet Take 1 tablet (600 mg total) by mouth every 6 (six) hours as needed. 90 tablet 0  . losartan-hydrochlorothiazide (HYZAAR) 100-25 MG tablet Take 1 tablet by mouth daily. 90 tablet 1  . metFORMIN (GLUCOPHAGE) 500 MG tablet Take 1 tablet (500 mg total) by mouth daily with breakfast. 90 tablet 1  . montelukast (SINGULAIR) 10 MG tablet Take 1 tablet (10 mg total) by mouth at bedtime. 90 tablet 1  . Omega-3 Fatty Acids (FISH OIL PO) Take 1 capsule by mouth daily.    Brandi Lester DELICA LANCETS 29F MISC OneTouch Delica Lancets 33 gauge    . pantoprazole (PROTONIX) 40 MG tablet Take 1 tablet (40 mg total) by mouth daily. 90 tablet 1  . potassium  chloride SA (K-DUR,KLOR-CON) 20 MEQ tablet Take 1 tablet (20 mEq total) 4 (four) times daily by mouth. 120 tablet 3  . Probiotic Product (PROBIOTIC-10 PO) Take by mouth daily.    . TURMERIC PO Take 1 capsule by mouth daily.    . Vitamin D, Ergocalciferol, (DRISDOL) 50000 units CAPS capsule Take 1 capsule (50,000 Units total) every 7 (seven) days by mouth. 4 capsule 0   No current facility-administered medications on file prior to visit.     PAST MEDICAL HISTORY: Past Medical History:  Diagnosis Date  . ADD (attention deficit disorder)   . Allergy    allergic rhinitis  . Anemia 07/25/2013  . Back pain   . Costochondritis 02/20/2015    . Diabetes mellitus    Type II   previously 3 years ago - no longer on meds   . Dyslipidemia 07/25/2013  . Fungus infection 10/13   Left great toe  . GERD (gastroesophageal reflux disease)   . Hypertension   . IBS (irritable bowel syndrome)   . Ingrown nail 10/13   right foot next to the last toe  . Insomnia 04/20/2013  . Joint pain   . Lactose intolerance   . Leg edema   . Obesity   . Pedal edema 02/20/2015  . Prediabetes   . Preventative health care 09/25/2015  . PVC (premature ventricular contraction)     PAST SURGICAL HISTORY: Past Surgical History:  Procedure Laterality Date  . HYSTEROSCOPY N/A 10/04/2015   Procedure: HYSTEROSCOPY with Removal IUD;  Surgeon: Vanessa Kick, MD;  Location: [facility];  Service: Gynecology;  Laterality: N/A;  . lasik     eye surgery  . WISDOM TOOTH EXTRACTION      SOCIAL HISTORY: Social History   Tobacco Use  . Smoking status: Former Smoker    Packs/day: 1.00    Years: 15.00    Pack years: 15.00    Last attempt to quit: 09/03/2001    Years since quitting: 15.9  . Smokeless tobacco: Never Used  Substance Use Topics  . Alcohol use: Yes    Comment: rare  . Drug use: No    FAMILY HISTORY: Family History  Problem Relation Age of Onset  . Heart attack Mother 32  . Hypertension Mother   . Heart disease Mother   . Obesity Mother   . Cancer Father        prostate  . Heart attack Sister 41  . Hypertension Other     ROS: Review of Systems  Constitutional: Positive for weight loss.  Musculoskeletal: Positive for back pain.    PHYSICAL EXAM: Blood pressure 117/73, pulse 87, temperature 98.9 F (37.2 C), temperature source Oral, height 5\' 9"  (1.753 m), weight (!) 302 lb (137 kg), SpO2 99 %. Body mass index is 44.6 kg/m. Physical Exam  Constitutional: She is oriented to person, place, and time. She appears well-developed and well-nourished.  Cardiovascular: Normal rate.  Pulmonary/Chest: Effort normal.  Musculoskeletal: Normal  range of motion.  Neurological: She is oriented to person, place, and time.  Skin: Skin is warm and dry.  Psychiatric: She has a normal mood and affect. Her behavior is normal.  Vitals reviewed.   RECENT LABS AND TESTS: BMET    Component Value Date/Time   NA 139 05/23/2017 1025   K 3.6 05/23/2017 1025   CL 100 05/23/2017 1025   CO2 25 05/23/2017 1025   GLUCOSE 100 (H) 05/23/2017 1025   GLUCOSE 107 (H) 03/15/2017 1140   BUN 9  05/23/2017 1025   CREATININE 0.71 05/23/2017 1025   CREATININE 0.72 02/03/2014 1648   CALCIUM 9.2 05/23/2017 1025   GFRNONAA 100 05/23/2017 1025   GFRAA 116 05/23/2017 1025   Lab Results  Component Value Date   HGBA1C 6.2 (H) 05/23/2017   HGBA1C 6.6 (H) 03/15/2017   HGBA1C 6.3 11/06/2016   HGBA1C 6.2 07/18/2016   HGBA1C 6.0 03/15/2016   Lab Results  Component Value Date   INSULIN 31.1 (H) 05/23/2017   CBC    Component Value Date/Time   WBC 7.0 05/23/2017 1025   WBC 6.7 03/15/2017 1140   RBC 4.46 05/23/2017 1025   RBC 4.49 03/15/2017 1140   HGB 12.1 05/23/2017 1025   HCT 36.6 05/23/2017 1025   PLT 273.0 03/15/2017 1140   MCV 82 05/23/2017 1025   MCH 27.1 05/23/2017 1025   MCH 28.2 02/03/2014 1648   MCHC 33.1 05/23/2017 1025   MCHC 34.0 03/15/2017 1140   RDW 14.9 05/23/2017 1025   LYMPHSABS 1.7 05/23/2017 1025   MONOABS 0.4 06/09/2014 0843   EOSABS 0.1 05/23/2017 1025   BASOSABS 0.0 05/23/2017 1025   Iron/TIBC/Ferritin/ %Sat No results found for: IRON, TIBC, FERRITIN, IRONPCTSAT Lipid Panel     Component Value Date/Time   CHOL 139 05/23/2017 1025   TRIG 113 05/23/2017 1025   HDL 34 (L) 05/23/2017 1025   CHOLHDL 4 03/15/2017 1140   VLDL 26.6 03/15/2017 1140   LDLCALC 82 05/23/2017 1025   Hepatic Function Panel     Component Value Date/Time   PROT 7.3 05/23/2017 1025   ALBUMIN 4.3 05/23/2017 1025   AST 8 05/23/2017 1025   ALT 12 05/23/2017 1025   ALKPHOS 85 05/23/2017 1025   BILITOT 0.2 05/23/2017 1025   BILIDIR 0.0  06/09/2014 0843   IBILI 0.1 (L) 02/03/2014 1648      Component Value Date/Time   TSH 1.710 05/23/2017 1025   TSH 1.26 03/15/2017 1140   TSH 1.68 11/06/2016 1438    ASSESSMENT AND PLAN: Chronic right-sided low back pain with right-sided sciatica  Class 3 severe obesity with serious comorbidity and body mass index (BMI) of 40.0 to 44.9 in adult, unspecified obesity type ([place])  PLAN:  Chronic right-sided low back pain with right-sided sciatica Brandi Lester will continue medications (Tylenol) as prescribed and will follow up with her PCP for further management and evaluation. She agrees to follow up with our clinic in 3 weeks.  We spent > than 50% of the 15 minute visit on the counseling as documented in the note.  Obesity Brandi Lester is currently in the action stage of change. As such, her goal is to continue with weight loss efforts She has agreed to keep a food journal with 1500 calories and 95 grams of protein daily Brandi Lester has been instructed to work up to a goal of 150 minutes of combined cardio and strengthening exercise per week for weight loss and overall health benefits. We discussed the following Behavioral Modification Strategies today: increasing lean protein intake and work on meal planning and easy cooking plans  Brandi Lester has agreed to follow up with our clinic in 3 weeks. She was informed of the importance of frequent follow up visits to maximize her success with intensive lifestyle modifications for her multiple health conditions.  I, Doreene Nest, am acting as transcriptionist for Brandi Duverney, PA-C  I have reviewed the above documentation for accuracy and completeness, and I agree with the above. -Brandi Duverney, PA-C  I have reviewed the above note and  agree with the plan. -Brandi Nip, MD   OBESITY BEHAVIORAL INTERVENTION VISIT  Today's visit was # 6 out of 22.  Starting weight: 325 lbs Starting date: 05/23/17 Today's weight : 302 lbs  Today's date: 08/08/2017 Total  lbs lost to date: 23 (Patients must lose 7 lbs in the first 6 months to continue with counseling)   ASK: We discussed the diagnosis of obesity with Brandi Lester today and Brandi Lester agreed to give [name] permission to discuss obesity behavioral modification therapy today.  ASSESS: Brandi Lester has the diagnosis of obesity and her BMI today is 44.58 Brandi Lester is in the action stage of change   ADVISE: Brandi Lester was educated on the multiple health risks of obesity as well as the benefit of weight loss to improve her health. She was advised of the need for long term treatment and the importance of lifestyle modifications.  AGREE: Multiple dietary modification options and treatment options were discussed and  Brandi Lester agreed to keep a food journal with 1500 calories and 95 grams of protein daily We discussed the following Behavioral Modification Strategies today: increasing lean protein intake and work on meal planning and easy cooking plans

## 2017-08-14 MED FILL — POTASSIUM CL ER 20 MEQ TABL: 20 | 30 days supply | Qty: 120 | Fill #2

## 2017-08-22 ENCOUNTER — Ambulatory Visit (INDEPENDENT_AMBULATORY_CARE_PROVIDER_SITE_OTHER): Payer: BC Managed Care – PPO | Admitting: Family Medicine

## 2017-08-22 ENCOUNTER — Ambulatory Visit (INDEPENDENT_AMBULATORY_CARE_PROVIDER_SITE_OTHER): Payer: BC Managed Care – PPO | Admitting: *Deleted

## 2017-08-22 VITALS — BP 120/75 | HR 87 | Temp 100.4°F | Ht 69.0 in | Wt 302.0 lb

## 2017-08-22 DIAGNOSIS — E559 Vitamin D deficiency, unspecified: Secondary | ICD-10-CM

## 2017-08-22 DIAGNOSIS — Z6841 Body Mass Index (BMI) 40.0 and over, adult: Secondary | ICD-10-CM

## 2017-08-22 DIAGNOSIS — J309 Allergic rhinitis, unspecified: Secondary | ICD-10-CM

## 2017-08-22 MED ORDER — VITAMIN D (ERGOCALCIFEROL) 1.25 MG (50000 UNIT) PO CAPS: 50000.0000 [IU] | ORAL_CAPSULE | ORAL | 0 refills | Status: DC

## 2017-08-22 MED FILL — VIT D2 1.25 MG (50,000 UNIT: 1.25 MG | 28 days supply | Qty: 4 | Fill #0

## 2017-08-29 ENCOUNTER — Ambulatory Visit (INDEPENDENT_AMBULATORY_CARE_PROVIDER_SITE_OTHER): Payer: BC Managed Care – PPO | Admitting: *Deleted

## 2017-08-29 DIAGNOSIS — J309 Allergic rhinitis, unspecified: Secondary | ICD-10-CM | POA: Diagnosis not present

## 2017-09-04 NOTE — Progress Notes (Signed)
Office: [phone]  /  Fax: [phone]   HPI:   Chief Complaint: OBESITY Brandi Lester is here to discuss her progress with her obesity treatment plan. She is on the keep a food journal with 1500 calories and 95 grams of protein daily and is following her eating plan approximately 80 % of the time. She states she is walking for 20-30 minutes 3-4 times per week. Brandi Lester has maintained her weight over the last few weeks journaling on and off and has increase walking. She notes increase celebrating eating and eating out but has done well with portion control and making smarter food choices.  Her weight is (!) 302 lb (137 kg) today and has not lost weight since her last visit. She has lost 23 lbs since starting treatment with [name].  Vitamin D deficiency Brandi Lester has a diagnosis of vitamin D deficiency. She is stable on prescription Vit D and she denies nausea, vomiting or muscle weakness.  ALLERGIES: Allergies  Allergen Reactions  . Penicillins Itching and Swelling    Has patient had a PCN reaction causing immediate rash, facial/tongue/throat swelling, SOB or lightheadedness with hypotension: yes Has patient had a PCN reaction causing severe rash involving mucus membranes or skin necrosis: no Has patient had a PCN reaction that required hospitalization no Has patient had a PCN reaction occurring within the last 10 years: yes If all of the above answers are "NO", then may proceed with Cephalosporin use.   Brandi Lester [Liraglutide]     Vomiting    MEDICATIONS: Current Outpatient Medications on File Prior to Visit  Medication Sig Dispense Refill  . acetaminophen (TYLENOL 8 HOUR) 650 MG CR tablet Take 1 tablet (650 mg total) every 8 (eight) hours as needed by mouth for pain.    [name] amLODipine (NORVASC) 10 MG tablet amlodipine 10 mg tablet    . aspirin EC 81 MG tablet Take 1 tablet (81 mg total) by mouth daily.    [name] azelastine (ASTELIN) 0.1 % nasal spray Place 2 sprays into both nostrils 2 (two)  times daily. Use in each nostril as directed 90 mL 1  . Cyanocobalamin (B-12) 1000 MCG CAPS Take by mouth daily.    [name] EPINEPHrine (EPIPEN 2-PAK) 0.3 mg/0.3 mL IJ SOAJ injection Use as directed for severe allergic reaction 2 Device 1  . fexofenadine (ALLEGRA ALLERGY) 180 MG tablet Take 1 tablet (180 mg total) by mouth daily. 301 tablet 1  . fluticasone (FLONASE) 50 MCG/ACT nasal spray Place 2 sprays into both nostrils daily. (Patient taking differently: Place 2 sprays into both nostrils daily as needed for allergies. ) 16 g 11  . glucose blood test strip OneTouch Ultra Test strips    . ibuprofen (ADVIL,MOTRIN) 600 MG tablet Take 1 tablet (600 mg total) by mouth every 6 (six) hours as needed. 90 tablet 0  . losartan-hydrochlorothiazide (HYZAAR) 100-25 MG tablet Take 1 tablet by mouth daily. 90 tablet 1  . metFORMIN (GLUCOPHAGE) 500 MG tablet Take 1 tablet (500 mg total) by mouth daily with breakfast. 90 tablet 1  . montelukast (SINGULAIR) 10 MG tablet Take 1 tablet (10 mg total) by mouth at bedtime. 90 tablet 1  . Omega-3 Fatty Acids (FISH OIL PO) Take 1 capsule by mouth daily.    Brandi Lester DELICA LANCETS 38G MISC OneTouch Delica Lancets 33 gauge    . pantoprazole (PROTONIX) 40 MG tablet Take 1 tablet (40 mg total) by mouth daily. 90 tablet 1  . potassium chloride SA (K-DUR,KLOR-CON) 20 MEQ tablet  Take 1 tablet (20 mEq total) 4 (four) times daily by mouth. 120 tablet 3  . Probiotic Product (PROBIOTIC-10 PO) Take by mouth daily.    . TURMERIC PO Take 1 capsule by mouth daily.     No current facility-administered medications on file prior to visit.     PAST MEDICAL HISTORY: Past Medical History:  Diagnosis Date  . ADD (attention deficit disorder)   . Allergy    allergic rhinitis  . Anemia 07/25/2013  . Back pain   . Costochondritis 02/20/2015  . Diabetes mellitus    Type II   previously 3 years ago - no longer on meds   . Dyslipidemia 07/25/2013  . Fungus infection 10/13   Left great toe   . GERD (gastroesophageal reflux disease)   . Hypertension   . IBS (irritable bowel syndrome)   . Ingrown nail 10/13   right foot next to the last toe  . Insomnia 04/20/2013  . Joint pain   . Lactose intolerance   . Leg edema   . Obesity   . Pedal edema 02/20/2015  . Prediabetes   . Preventative health care 09/25/2015  . PVC (premature ventricular contraction)     PAST SURGICAL HISTORY: Past Surgical History:  Procedure Laterality Date  . HYSTEROSCOPY N/A 10/04/2015   Procedure: HYSTEROSCOPY with Removal IUD;  Surgeon: Vanessa Kick, MD;  Location: [facility];  Service: Gynecology;  Laterality: N/A;  . lasik     eye surgery  . WISDOM TOOTH EXTRACTION      SOCIAL HISTORY: Social History   Tobacco Use  . Smoking status: Former Smoker    Packs/day: 1.00    Years: 15.00    Pack years: 15.00    Last attempt to quit: 09/03/2001    Years since quitting: 16.0  . Smokeless tobacco: Never Used  Substance Use Topics  . Alcohol use: Yes    Comment: rare  . Drug use: No    FAMILY HISTORY: Family History  Problem Relation Age of Onset  . Heart attack Mother 84  . Hypertension Mother   . Heart disease Mother   . Obesity Mother   . Cancer Father        prostate  . Heart attack Sister 36  . Hypertension Other     ROS: Review of Systems  Constitutional: Negative for weight loss.  Gastrointestinal: Negative for nausea and vomiting.  Musculoskeletal:       Negative muscle weakness    PHYSICAL EXAM: Blood pressure 120/75, pulse 87, temperature (!) 100.4 F (38 C), temperature source Oral, height 5\' 9"  (1.753 m), weight (!) 302 lb (137 kg), SpO2 99 %. Body mass index is 44.6 kg/m. Physical Exam  Constitutional: She is oriented to person, place, and time. She appears well-developed and well-nourished.  Cardiovascular: Normal rate.  Pulmonary/Chest: Effort normal.  Musculoskeletal: Normal range of motion.  Neurological: She is oriented to person, place, and time.  Skin: Skin  is warm and dry.  Psychiatric: She has a normal mood and affect. Her behavior is normal.  Vitals reviewed.   RECENT LABS AND TESTS: BMET    Component Value Date/Time   NA 139 05/23/2017 1025   K 3.6 05/23/2017 1025   CL 100 05/23/2017 1025   CO2 25 05/23/2017 1025   GLUCOSE 100 (H) 05/23/2017 1025   GLUCOSE 107 (H) 03/15/2017 1140   BUN 9 05/23/2017 1025   CREATININE 0.71 05/23/2017 1025   CREATININE 0.72 02/03/2014 1648   CALCIUM 9.2 05/23/2017  [place] 05/23/2017 1025   GFRAA 116 05/23/2017 1025   Lab Results  Component Value Date   HGBA1C 6.2 (H) 05/23/2017   HGBA1C 6.6 (H) 03/15/2017   HGBA1C 6.3 11/06/2016   HGBA1C 6.2 07/18/2016   HGBA1C 6.0 03/15/2016   Lab Results  Component Value Date   INSULIN 31.1 (H) 05/23/2017   CBC    Component Value Date/Time   WBC 7.0 05/23/2017 1025   WBC 6.7 03/15/2017 1140   RBC 4.46 05/23/2017 1025   RBC 4.49 03/15/2017 1140   HGB 12.1 05/23/2017 1025   HCT 36.6 05/23/2017 1025   PLT 273.0 03/15/2017 1140   MCV 82 05/23/2017 1025   MCH 27.1 05/23/2017 1025   MCH 28.2 02/03/2014 1648   MCHC 33.1 05/23/2017 1025   MCHC 34.0 03/15/2017 1140   RDW 14.9 05/23/2017 1025   LYMPHSABS 1.7 05/23/2017 1025   MONOABS 0.4 06/09/2014 0843   EOSABS 0.1 05/23/2017 1025   BASOSABS 0.0 05/23/2017 1025   Iron/TIBC/Ferritin/ %Sat No results found for: IRON, TIBC, FERRITIN, IRONPCTSAT Lipid Panel     Component Value Date/Time   CHOL 139 05/23/2017 1025   TRIG 113 05/23/2017 1025   HDL 34 (L) 05/23/2017 1025   CHOLHDL 4 03/15/2017 1140   VLDL 26.6 03/15/2017 1140   LDLCALC 82 05/23/2017 1025   Hepatic Function Panel     Component Value Date/Time   PROT 7.3 05/23/2017 1025   ALBUMIN 4.3 05/23/2017 1025   AST 8 05/23/2017 1025   ALT 12 05/23/2017 1025   ALKPHOS 85 05/23/2017 1025   BILITOT 0.2 05/23/2017 1025   BILIDIR 0.0 06/09/2014 0843   IBILI 0.1 (L) 02/03/2014 1648      Component Value Date/Time   TSH 1.710  05/23/2017 1025   TSH 1.26 03/15/2017 1140   TSH 1.68 11/06/2016 1438    ASSESSMENT AND PLAN: Vitamin D deficiency - Plan: Vitamin D, Ergocalciferol, (DRISDOL) 50000 units CAPS capsule  Class 3 severe obesity with serious comorbidity and body mass index (BMI) of 40.0 to 44.9 in adult, unspecified obesity type ([facility])  PLAN:  Vitamin D Deficiency Brandi Lester was informed that low vitamin D levels contributes to fatigue and are associated with obesity, breast, and colon cancer. Brandi Lester agrees to continue taking prescription Vit D @50 ,000 IU every week #4 and we will refill for 1 month. She will follow up for routine testing of vitamin D, at least 2-3 times per year. She was informed of the risk of over-replacement of vitamin D and agrees to not increase her dose unless he discusses this with [name] first. Brandi Lester agrees to follow up with our clinic in 3 weeks and we will check labs at that time.  Obesity Brandi Lester is currently in the action stage of change. As such, her goal is to maintain weight for now She has agreed to portion control better and make smarter food choices, such as increase vegetables and decrease simple carbohydrates  Brandi Lester has been instructed to work up to a goal of 150 minutes of combined cardio and strengthening exercise per week for weight loss and overall health benefits. We discussed the following Behavioral Modification Strategies today: increasing lean protein intake, work on meal planning and easy cooking plans, holiday eating strategies, and celebration eating strategies  The goal is to maintain over Christmas and New Years and work on additional weight loss after the 1st of the year.  Brandi Lester has agreed to follow up with our clinic in 3 weeks.  She was informed of the importance of frequent follow up visits to maximize her success with intensive lifestyle modifications for her multiple health conditions.  I, Trixie Dredge, am acting as transcriptionist for Dennard Nip,  MD  I have reviewed the above documentation for accuracy and completeness, and I agree with the above. -Dennard Nip, MD     Today's visit was # 7 out of 22.  Starting weight: 325 lbs Starting date: 05/23/17 Today's weight : 302 lbs  Today's date: 08/22/17 Total lbs lost to date: 23 (Patients must lose 7 lbs in the first 6 months to continue with counseling)   ASK: We discussed the diagnosis of obesity with Brandi Lester today and Brandi Lester agreed to give [name] permission to discuss obesity behavioral modification therapy today.  ASSESS: Brandi Lester has the diagnosis of obesity and her BMI today is 44.58 [place] is in the action stage of change   ADVISE: Lachanda was educated on the multiple health risks of obesity as well as the benefit of weight loss to improve her health. She was advised of the need for long term treatment and the importance of lifestyle modifications.  AGREE: Multiple dietary modification options and treatment options were discussed and  Lilyrose agreed to portion control better and make smarter food choices, such as increase vegetables and decrease simple carbohydrates  We discussed the following Behavioral Modification Strategies today: increasing lean protein intake, work on meal planning and easy cooking plans, holiday eating strategies, and celebration eating strategies

## 2017-09-05 ENCOUNTER — Ambulatory Visit: Payer: BC Managed Care – PPO | Admitting: Family Medicine

## 2017-09-05 ENCOUNTER — Encounter: Payer: Self-pay | Admitting: Family Medicine

## 2017-09-05 VITALS — BP 126/59 | HR 88 | Temp 98.6°F | Resp 18 | Ht 68.9 in

## 2017-09-05 DIAGNOSIS — E669 Obesity, unspecified: Secondary | ICD-10-CM | POA: Diagnosis not present

## 2017-09-05 DIAGNOSIS — N921 Excessive and frequent menstruation with irregular cycle: Secondary | ICD-10-CM

## 2017-09-05 DIAGNOSIS — I10 Essential (primary) hypertension: Secondary | ICD-10-CM | POA: Diagnosis not present

## 2017-09-05 DIAGNOSIS — R600 Localized edema: Secondary | ICD-10-CM | POA: Diagnosis not present

## 2017-09-05 DIAGNOSIS — E6609 Other obesity due to excess calories: Secondary | ICD-10-CM | POA: Diagnosis not present

## 2017-09-05 DIAGNOSIS — T7840XA Allergy, unspecified, initial encounter: Secondary | ICD-10-CM | POA: Diagnosis not present

## 2017-09-05 DIAGNOSIS — E876 Hypokalemia: Secondary | ICD-10-CM

## 2017-09-05 DIAGNOSIS — D219 Benign neoplasm of connective and other soft tissue, unspecified: Secondary | ICD-10-CM

## 2017-09-05 DIAGNOSIS — E1169 Type 2 diabetes mellitus with other specified complication: Secondary | ICD-10-CM | POA: Diagnosis not present

## 2017-09-05 DIAGNOSIS — M7918 Myalgia, other site: Secondary | ICD-10-CM | POA: Diagnosis not present

## 2017-09-05 DIAGNOSIS — E785 Hyperlipidemia, unspecified: Secondary | ICD-10-CM | POA: Diagnosis not present

## 2017-09-05 MED ORDER — POTASSIUM CHLORIDE CRYS ER 20 MEQ PO TBCR: 20.0000 meq | EXTENDED_RELEASE_TABLET | Freq: Four times a day (QID) | ORAL | 3 refills | Status: DC

## 2017-09-05 MED ORDER — AMLODIPINE BESYLATE 5 MG PO TABS: 5.0000 mg | ORAL_TABLET | Freq: Every day | ORAL | 1 refills | Status: DC

## 2017-09-05 MED FILL — AMLODIPINE BESYLATE 5 MG TA: 5 | 90 days supply | Qty: 90 | Fill #0

## 2017-09-05 NOTE — Progress Notes (Signed)
Subjective:  I acted as a Education administrator for [employer]. Yancey Flemings, [place]   Patient ID: Brandi Lester, female    DOB: March 12, 1966, 52 y.o.   MRN: [id number]  Chief Complaint  Patient presents with  . Follow-up    HPI   Patient in today for follow-up and overall is feeling well.  No recent febrile illness or hospitalizations.  No polyuria or polydipsia.  She has been trying to maintain a heart healthy diet and has increased her activity.  She is happy with her recent weight loss.  Notes she continues to have pedal edema but it is improving with weight loss.  Compression hose help as well but she does note these seem to make her great toes painful.  She had work done on her toenails years ago and they have stayed sensitive since then. Denies CP/palp/SOB/HA/congestion/fevers/GI or GU c/o. Taking meds as prescribed Patient Care Team: Mosie Lukes, MD as PCP - General (Family Medicine) Neldon Mc, Donnamarie Poag, MD as Consulting Physician (Allergy and Immunology) Gus Height, MD as Consulting Physician (Obstetrics and Gynecology) Rutherford Guys, MD as Consulting Physician (Ophthalmology) Como, Rozel as Consulting Physician (Hand Surgery) Leanora Cover, MD as Consulting Physician (Orthopedic Surgery)   Past Medical History:  Diagnosis Date  . ADD (attention deficit disorder)   . Allergy    allergic rhinitis  . Anemia 07/25/2013  . Back pain   . Costochondritis 02/20/2015  . Diabetes mellitus    Type II   previously 3 years ago - no longer on meds   . Dyslipidemia 07/25/2013  . Fungus infection 10/13   Left great toe  . GERD (gastroesophageal reflux disease)   . Hypertension   . IBS (irritable bowel syndrome)   . Ingrown nail 10/13   right foot next to the last toe  . Insomnia 04/20/2013  . Joint pain   . Lactose intolerance   . Leg edema   . Obesity   . Pedal edema 02/20/2015  . Prediabetes   . Preventative health care 09/25/2015  . PVC (premature ventricular contraction)     Past  Surgical History:  Procedure Laterality Date  . HYSTEROSCOPY N/A 10/04/2015   Procedure: HYSTEROSCOPY with Removal IUD;  Surgeon: Vanessa Kick, MD;  Location: [facility];  Service: Gynecology;  Laterality: N/A;  . lasik     eye surgery  . WISDOM TOOTH EXTRACTION      Family History  Problem Relation Age of Onset  . Heart attack Mother 34  . Hypertension Mother   . Heart disease Mother   . Obesity Mother   . Cancer Father        prostate  . Heart attack Sister 72  . Hypertension Other     Social History   Socioeconomic History  . Marital status: Single    Spouse name: Not on file  . Number of children: Not on file  . Years of education: Not on file  . Highest education level: Not on file  Social Needs  . Financial resource strain: Not on file  . Food insecurity - worry: Not on file  . Food insecurity - inability: Not on file  . Transportation needs - medical: Not on file  . Transportation needs - non-medical: Not on file  Occupational History  . Occupation: Pharmacist, hospital, [employer]  Tobacco Use  . Smoking status: Former Smoker    Packs/day: 1.00    Years: 15.00    Pack years: 15.00    Last attempt to  quit: 09/03/2001    Years since quitting: 16.0  . Smokeless tobacco: Never Used  Substance and Sexual Activity  . Alcohol use: Yes    Comment: rare  . Drug use: No  . Sexual activity: Not on file  Other Topics Concern  . Not on file  Social History Narrative  . Not on file    Outpatient Medications Prior to Visit  Medication Sig Dispense Refill  . acetaminophen (TYLENOL 8 HOUR) 650 MG CR tablet Take 1 tablet (650 mg total) every 8 (eight) hours as needed by mouth for pain.    [name] aspirin EC 81 MG tablet Take 1 tablet (81 mg total) by mouth daily.    [name] azelastine (ASTELIN) 0.1 % nasal spray Place 2 sprays into both nostrils 2 (two) times daily. Use in each nostril as directed 90 mL 1  . Cyanocobalamin (B-12) 1000 MCG CAPS Take by mouth daily.    [name] EPINEPHrine (EPIPEN 2-PAK)  0.3 mg/0.3 mL IJ SOAJ injection Use as directed for severe allergic reaction 2 Device 1  . fexofenadine (ALLEGRA ALLERGY) 180 MG tablet Take 1 tablet (180 mg total) by mouth daily. 301 tablet 1  . fluticasone (FLONASE) 50 MCG/ACT nasal spray Place 2 sprays into both nostrils daily. (Patient taking differently: Place 2 sprays into both nostrils daily as needed for allergies. ) 16 g 11  . glucose blood test strip OneTouch Ultra Test strips    . ibuprofen (ADVIL,MOTRIN) 600 MG tablet Take 1 tablet (600 mg total) by mouth every 6 (six) hours as needed. 90 tablet 0  . losartan-hydrochlorothiazide (HYZAAR) 100-25 MG tablet Take 1 tablet by mouth daily. 90 tablet 1  . metFORMIN (GLUCOPHAGE) 500 MG tablet Take 1 tablet (500 mg total) by mouth daily with breakfast. 90 tablet 1  . montelukast (SINGULAIR) 10 MG tablet Take 1 tablet (10 mg total) by mouth at bedtime. 90 tablet 1  . Omega-3 Fatty Acids (FISH OIL PO) Take 1 capsule by mouth daily.    [name] DELICA LANCETS 76E MISC OneTouch Delica Lancets 33 gauge    . pantoprazole (PROTONIX) 40 MG tablet Take 1 tablet (40 mg total) by mouth daily. 90 tablet 1  . potassium chloride SA (K-DUR,KLOR-CON) 20 MEQ tablet Take 1 tablet (20 mEq total) by mouth 3 (three) times daily. 120 tablet 3  . Probiotic Product (PROBIOTIC-10 PO) Take by mouth daily.    . TURMERIC PO Take 1 capsule by mouth daily.    . Vitamin D, Ergocalciferol, (DRISDOL) 50000 units CAPS capsule Take 1 capsule (50,000 Units total) by mouth every 7 (seven) days. 4 capsule 0  . amLODipine (NORVASC) 10 MG tablet amlodipine 10 mg tablet    . potassium chloride SA (K-DUR,KLOR-CON) 20 MEQ tablet Take 1 tablet (20 mEq total) 4 (four) times daily by mouth. 120 tablet 3   No facility-administered medications prior to visit.     Allergies  Allergen Reactions  . Penicillins Itching and Swelling    Has patient had a PCN reaction causing immediate rash, facial/tongue/throat swelling, SOB or  lightheadedness with hypotension: yes Has patient had a PCN reaction causing severe rash involving mucus membranes or skin necrosis: no Has patient had a PCN reaction that required hospitalization no Has patient had a PCN reaction occurring within the last 10 years: yes If all of the above answers are "NO", then may proceed with Cephalosporin use.   [name] [Liraglutide]     Vomiting    Review of Systems  Constitutional:  Positive for malaise/fatigue. Negative for fever.  HENT: Negative for congestion.   Eyes: Negative for blurred vision.  Respiratory: Negative for shortness of breath.   Cardiovascular: Positive for leg swelling. Negative for chest pain and palpitations.  Gastrointestinal: Negative for abdominal pain, blood in stool and nausea.  Genitourinary: Negative for dysuria and frequency.  Musculoskeletal: Positive for back pain and joint pain. Negative for falls.  Skin: Negative for rash.  Neurological: Negative for dizziness, loss of consciousness and headaches.  Endo/Heme/Allergies: Negative for environmental allergies.  Psychiatric/Behavioral: Negative for depression. The patient is not nervous/anxious.        Objective:    Physical Exam  Constitutional: She is oriented to person, place, and time. She appears well-developed and well-nourished. No distress.  HENT:  Head: Normocephalic and atraumatic.  Nose: Nose normal.  Eyes: Right eye exhibits no discharge. Left eye exhibits no discharge.  Neck: Normal range of motion. Neck supple.  Cardiovascular: Normal rate and regular rhythm.  No murmur heard. Pulmonary/Chest: Effort normal and breath sounds normal.  Abdominal: Soft. Bowel sounds are normal. There is no tenderness.  Musculoskeletal: She exhibits no edema.  Neurological: She is alert and oriented to person, place, and time.  Skin: Skin is warm and dry.  Psychiatric: She has a normal mood and affect.  Nursing note and vitals reviewed.   BP (!) 126/59 (BP  Location: Left Arm, Patient Position: Sitting, Cuff Size: Large)   Pulse 88   Temp 98.6 F (37 C) (Oral)   Resp 18   Ht 5' 8.9" (1.75 m)   SpO2 100%   BMI 44.73 kg/m  Wt Readings from Last 3 Encounters:  08/22/17 (!) 302 lb (137 kg)  08/08/17 (!) 302 lb (137 kg)  07/18/17 (!) 305 lb (138.3 kg)   BP Readings from Last 3 Encounters:  09/05/17 (!) 126/59  08/22/17 120/75  08/08/17 117/73     Immunization History  Administered Date(s) Administered  . Influenza Whole 06/22/2009, 08/03/2010  . Influenza,inj,Quad PF,6+ Mos 07/20/2013, 05/11/2014, 06/29/2015, 06/26/2016, 07/23/2017  . Pneumococcal Conjugate-13 09/15/2015  . Td 07/21/2009    Health Maintenance  Topic Date Due  . FOOT EXAM  11/06/2017  . HEMOGLOBIN A1C  11/20/2017  . OPHTHALMOLOGY EXAM  03/29/2018  . PAP SMEAR  04/03/2018  . TETANUS/TDAP  07/22/2019  . PNEUMOCOCCAL POLYSACCHARIDE VACCINE (2) 09/14/2020  . INFLUENZA VACCINE  Completed  . HIV Screening  Addressed    Lab Results  Component Value Date   WBC 7.0 05/23/2017   HGB 12.1 05/23/2017   HCT 36.6 05/23/2017   PLT 273.0 03/15/2017   GLUCOSE 100 (H) 05/23/2017   CHOL 139 05/23/2017   TRIG 113 05/23/2017   HDL 34 (L) 05/23/2017   LDLCALC 82 05/23/2017   ALT 12 05/23/2017   AST 8 05/23/2017   NA 139 05/23/2017   K 3.6 05/23/2017   CL 100 05/23/2017   CREATININE 0.71 05/23/2017   BUN 9 05/23/2017   CO2 25 05/23/2017   TSH 1.710 05/23/2017   HGBA1C 6.2 (H) 05/23/2017   MICROALBUR 0.50 12/15/2012    Lab Results  Component Value Date   TSH 1.710 05/23/2017   Lab Results  Component Value Date   WBC 7.0 05/23/2017   HGB 12.1 05/23/2017   HCT 36.6 05/23/2017   MCV 82 05/23/2017   PLT 273.0 03/15/2017   Lab Results  Component Value Date   NA 139 05/23/2017   K 3.6 05/23/2017   CO2 25 05/23/2017   GLUCOSE  100 (H) 05/23/2017   BUN 9 05/23/2017   CREATININE 0.71 05/23/2017   BILITOT 0.2 05/23/2017   ALKPHOS 85 05/23/2017   AST 8  05/23/2017   ALT 12 05/23/2017   PROT 7.3 05/23/2017   ALBUMIN 4.3 05/23/2017   CALCIUM 9.2 05/23/2017   GFR 104.09 03/15/2017   Lab Results  Component Value Date   CHOL 139 05/23/2017   Lab Results  Component Value Date   HDL 34 (L) 05/23/2017   Lab Results  Component Value Date   LDLCALC 82 05/23/2017   Lab Results  Component Value Date   TRIG 113 05/23/2017   Lab Results  Component Value Date   CHOLHDL 4 03/15/2017   Lab Results  Component Value Date   HGBA1C 6.2 (H) 05/23/2017         Assessment & Plan:   Problem List Items Addressed This Visit    Obesity    Encouraged DASH diet, decrease po intake and increase exercise as tolerated. Needs 7-8 hours of sleep nightly. Avoid trans fats, eat small, frequent meals every 4-5 hours with lean proteins, complex carbs and healthy fats. Minimize simple carbs,  Consider bariatric referral      Essential hypertension    Well controlled, no changes to meds. Encouraged heart healthy diet such as the DASH diet and exercise as tolerated.       Relevant Medications   amLODipine (NORVASC) 5 MG tablet   Diabetes mellitus type 2 in obese (HCC)    hgba1c acceptable, minimize simple carbs. Increase exercise as tolerated. Continue current meds      Musculoskeletal pain    Encouraged moist heat and gentle stretching as tolerated. May try NSAIDs and prescription meds as directed and report if symptoms worsen or seek immediate care. Topical treatments encouraged.       Dyslipidemia    Encouraged heart healthy diet, increase exercise, avoid trans fats, consider a krill oil cap daily      Pedal edema    Improving as she has lost weight and gotten more active. She notes the compression hose are helpful but they do help her large toes which are sensitive secondary to some procedures to her great toenails years ago. Encouraged to use some that have the toes cut out which has helped some. Consider referral to podiatry if worsens.        Hypokalemia   Relevant Medications   potassium chloride SA (K-DUR,KLOR-CON) 20 MEQ tablet    Other Visit Diagnoses    Menorrhagia with irregular cycle    -  Primary   Relevant Orders   Ambulatory referral to Obstetrics / Gynecology   Allergic state, initial encounter       Relevant Medications   potassium chloride SA (K-DUR,KLOR-CON) 20 MEQ tablet   Benign essential HTN       Relevant Medications   amLODipine (NORVASC) 5 MG tablet   potassium chloride SA (K-DUR,KLOR-CON) 20 MEQ tablet   Fibroids       Relevant Orders   Ambulatory referral to Obstetrics / Gynecology      I have discontinued Clydine T. Copen's amLODipine and potassium chloride SA. I have also changed her potassium chloride SA. Additionally, I am having her start on amLODipine. Lastly, I am having her maintain her fluticasone, fexofenadine, TURMERIC PO, Omega-3 Fatty Acids (FISH OIL PO), ibuprofen, losartan-hydrochlorothiazide, azelastine, montelukast, pantoprazole, aspirin EC, metFORMIN, ONETOUCH DELICA LANCETS 30Z, glucose blood, EPINEPHrine, B-12, Probiotic Product (PROBIOTIC-10 PO), acetaminophen, and Vitamin D (Ergocalciferol).  Meds ordered  this encounter  Medications  . amLODipine (NORVASC) 5 MG tablet    Sig: Take 1 tablet (5 mg total) by mouth daily.    Dispense:  90 tablet    Refill:  1  . DISCONTD: potassium chloride SA (K-DUR,KLOR-CON) 20 MEQ tablet    Sig: Take 1 tablet (20 mEq total) by mouth 4 (four) times daily.    Dispense:  120 tablet    Refill:  3    CMA served as scribe during this visit. History, Physical and Plan performed by medical provider. Documentation and orders reviewed and attested to.  Penni Homans, MD

## 2017-09-05 NOTE — Assessment & Plan Note (Signed)
Encouraged moist heat and gentle stretching as tolerated. May try NSAIDs and prescription meds as directed and report if symptoms worsen or seek immediate care. Topical treatments encouraged.

## 2017-09-05 NOTE — Patient Instructions (Addendum)
Chiropractic either [facility] or Dr Salomon Fick [facility] Gyn: Dr Ihor Dow or Dr Edwinna Areola  Back Pain, Adult Many adults have back pain from time to time. Common causes of back pain include:  A strained muscle or ligament.  Wear and tear (degeneration) of the spinal disks.  Arthritis.  A hit to the back.  Back pain can be short-lived (acute) or last a long time (chronic). A physical exam, lab tests, and imaging studies may be done to find the cause of your pain. Follow these instructions at home: Managing pain and stiffness  Take over-the-counter and prescription medicines only as told by your health care provider.  If directed, apply heat to the affected area as often as told by your health care provider. Use the heat source that your health care provider recommends, such as a moist heat pack or a heating pad. ? Place a towel between your skin and the heat source. ? Leave the heat on for 20-30 minutes. ? Remove the heat if your skin turns bright red. This is especially important if you are unable to feel pain, heat, or cold. You have a greater risk of getting burned.  If directed, apply ice to the injured area: ? Put ice in a plastic bag. ? Place a towel between your skin and the bag. ? Leave the ice on for 20 minutes, 2-3 times a day for the first 2-3 days. Activity  Do not stay in bed. Resting more than 1-2 days can delay your recovery.  Take short walks on even surfaces as soon as you are able. Try to increase the length of time you walk each day.  Do not sit, drive, or stand in one place for more than 30 minutes at a time. Sitting or standing for long periods of time can put stress on your back.  Use proper lifting techniques. When you bend and lift, use positions that put less stress on your back: ? [place] your knees. ? Keep the load close to your body. ? Avoid twisting.  Exercise regularly as told by your health care provider. Exercising will help your back  heal faster. This also helps prevent back injuries by keeping muscles strong and flexible.  Your health care provider may recommend that you see a physical therapist. This person can help you come up with a safe exercise program. Do any exercises as told by your physical therapist. Lifestyle  Maintain a healthy weight. Extra weight puts stress on your back and makes it difficult to have good posture.  Avoid activities or situations that make you feel anxious or stressed. Learn ways to manage anxiety and stress. One way to manage stress is through exercise. Stress and anxiety increase muscle tension and can make back pain worse. General instructions  Sleep on a firm mattress in a comfortable position. Try lying on your side with your knees slightly bent. If you lie on your back, put a pillow under your knees.  Follow your treatment plan as told by your health care provider. This may include: ? Cognitive or behavioral therapy. ? Acupuncture or massage therapy. ? Meditation or yoga. Contact a health care provider if:  You have pain that is not relieved with rest or medicine.  You have increasing pain going down into your legs or buttocks.  Your pain does not improve in 2 weeks.  You have pain at night.  You lose weight.  You have a fever or chills. Get help right away if:  You  develop new bowel or bladder control problems.  You have unusual weakness or numbness in your arms or legs.  You develop nausea or vomiting.  You develop abdominal pain.  You feel faint. Summary  Many adults have back pain from time to time. A physical exam, lab tests, and imaging studies may be done to find the cause of your pain.  Use proper lifting techniques. When you bend and lift, use positions that put less stress on your back.  Take over-the-counter and prescription medicines and apply heat or ice as directed by your health care provider. This information is not intended to replace advice  given to you by your health care provider. Make sure you discuss any questions you have with your health care provider. Document Released: 08/20/2005 Document Revised: 09/24/2016 Document Reviewed: 09/24/2016 Elsevier Interactive Patient Education  [employer].

## 2017-09-05 NOTE — Assessment & Plan Note (Signed)
Improving as she has lost weight and gotten more active. She notes the compression hose are helpful but they do help her large toes which are sensitive secondary to some procedures to her great toenails years ago. Encouraged to use some that have the toes cut out which has helped some. Consider referral to podiatry if worsens.

## 2017-09-05 NOTE — Assessment & Plan Note (Signed)
Encouraged DASH diet, decrease po intake and increase exercise as tolerated. Needs 7-8 hours of sleep nightly. Avoid trans fats, eat small, frequent meals every 4-5 hours with lean proteins, complex carbs and healthy fats. Minimize simple carbs,  Consider bariatric referral

## 2017-09-05 NOTE — Assessment & Plan Note (Signed)
hgba1c acceptable, minimize simple carbs. Increase exercise as tolerated. Continue current meds 

## 2017-09-05 NOTE — Assessment & Plan Note (Signed)
Well controlled, no changes to meds. Encouraged heart healthy diet such as the DASH diet and exercise as tolerated.  °

## 2017-09-05 NOTE — Assessment & Plan Note (Signed)
Encouraged heart healthy diet, increase exercise, avoid trans fats, consider a krill oil cap daily 

## 2017-09-06 MED FILL — POTASSIUM CL ER 20 MEQ TABL: 20 | 30 days supply | Qty: 120 | Fill #0

## 2017-09-12 ENCOUNTER — Ambulatory Visit (INDEPENDENT_AMBULATORY_CARE_PROVIDER_SITE_OTHER): Payer: BC Managed Care – PPO | Admitting: Physician Assistant

## 2017-09-12 ENCOUNTER — Other Ambulatory Visit: Payer: Self-pay | Admitting: Family Medicine

## 2017-09-12 ENCOUNTER — Ambulatory Visit (INDEPENDENT_AMBULATORY_CARE_PROVIDER_SITE_OTHER): Payer: BC Managed Care – PPO

## 2017-09-12 VITALS — BP 144/82 | HR 74 | Temp 98.4°F | Ht 69.0 in | Wt 302.0 lb

## 2017-09-12 DIAGNOSIS — I10 Essential (primary) hypertension: Secondary | ICD-10-CM | POA: Diagnosis not present

## 2017-09-12 DIAGNOSIS — E781 Pure hyperglyceridemia: Secondary | ICD-10-CM | POA: Diagnosis not present

## 2017-09-12 DIAGNOSIS — Z6841 Body Mass Index (BMI) 40.0 and over, adult: Secondary | ICD-10-CM

## 2017-09-12 DIAGNOSIS — Z8639 Personal history of other endocrine, nutritional and metabolic disease: Secondary | ICD-10-CM | POA: Diagnosis not present

## 2017-09-12 DIAGNOSIS — E559 Vitamin D deficiency, unspecified: Secondary | ICD-10-CM

## 2017-09-12 DIAGNOSIS — E119 Type 2 diabetes mellitus without complications: Secondary | ICD-10-CM

## 2017-09-12 DIAGNOSIS — J309 Allergic rhinitis, unspecified: Secondary | ICD-10-CM

## 2017-09-12 MED ORDER — VITAMIN D (ERGOCALCIFEROL) 1.25 MG (50000 UNIT) PO CAPS: 50000.0000 [IU] | ORAL_CAPSULE | ORAL | 0 refills | Status: DC

## 2017-09-12 MED FILL — LOSARTAN-HCTZ 100-25 MG TAB: 100-25 | 90 days supply | Qty: 90 | Fill #0

## 2017-09-12 MED FILL — metFORMIN HCL 500 MG TABS: 500 | 90 days supply | Qty: 90 | Fill #0

## 2017-09-12 MED FILL — VIT D2 1.25 MG (50,000 UNIT: 1.25 MG | 28 days supply | Qty: 4 | Fill #0

## 2017-09-12 NOTE — Progress Notes (Addendum)
Office: [phone]  /  Fax: [phone]   HPI:   Chief Complaint: OBESITY Brandi Lester is here to discuss her progress with her obesity treatment plan. She is on the portion control better and make smarter food choices plan  and is following her eating plan approximately 80 % of the time. She states she is walking for 30 minutes 3 times per week. [name] maintained her weight. She was mindful of her eating over the holidays. She would like more meal planning ideas for dinner. Her weight is (!) 302 lb (137 kg) today and has maintained weight over a period of 3 weeks since her last visit. She has lost 23 lbs since starting treatment with [name].  Hypertension [name] is a 52 y.o. female with hypertension. Her blood pressure is elevated today at 144/82 and she states she has not taken her blood pressure medications this morning. [name] states her PCP decreased her amlodipine to once daily due to low diastolic reading. [name] denies chest pain or shortness of breath on exertion. She is working weight loss to help control her blood pressure with the goal of decreasing her risk of heart attack and stroke. [name] blood pressure is not currently controlled.  Vitamin D deficiency [name] has a diagnosis of vitamin D deficiency. She is currently taking vit D and denies nausea, vomiting or muscle weakness.   Ref. Range 05/23/2017 10:25  Vitamin D, 25-Hydroxy Latest Ref Range: 30.0 - 100.0 ng/mL 14.9 (L)   Diabetes II non insulin without complications [name] has a diagnosis of diabetes type II. [name] states fasting BGs range is about 100 and she denies any hypoglycemic episodes. She has been working on intensive lifestyle modifications including diet, exercise, and weight loss to help control her blood glucose levels.  Hypertriglyceridemia (history of) [name] has a history of hypertriglyceridemia. Her triglycerides are within normal limits and her LDL is < 100. She has been trying to  improve her cholesterol levels with intensive lifestyle modification including a low saturated fat diet, exercise and weight loss.   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  ALLERGIES: Allergies  Allergen Reactions  . Penicillins Itching and Swelling    Has patient had a PCN reaction causing immediate rash, facial/tongue/throat swelling, SOB or lightheadedness with hypotension: yes Has patient had a PCN reaction causing severe rash involving mucus membranes or skin necrosis: no Has patient had a PCN reaction that required hospitalization no Has patient had a PCN reaction occurring within the last 10 years: yes If all of the above answers are "NO", then may proceed with Cephalosporin use.   [name] [Liraglutide]     Vomiting    MEDICATIONS: Current Outpatient Medications on File Prior to Visit  Medication Sig Dispense Refill  . acetaminophen (TYLENOL 8 HOUR) 650 MG CR tablet Take 1 tablet (650 mg total) every 8 (eight) hours as needed by mouth for pain.    [name] amLODipine (NORVASC) 5 MG tablet Take 1 tablet (5 mg total) by mouth daily. 90 tablet 1  . aspirin EC 81 MG tablet Take 1 tablet (81 mg total) by mouth daily.    [name] azelastine (ASTELIN) 0.1 % nasal spray Place 2 sprays into both nostrils 2 (two) times daily. Use in each nostril as directed 90 mL 1  . Cyanocobalamin (B-12) 1000 MCG CAPS Take by mouth daily.    [name] EPINEPHrine (EPIPEN 2-PAK) 0.3 mg/0.3 mL IJ SOAJ injection Use as directed for severe allergic reaction  2 Device 1  . fexofenadine (ALLEGRA ALLERGY) 180 MG tablet Take 1 tablet (180 mg total) by mouth daily. 301 tablet 1  . fluticasone (FLONASE) 50 MCG/ACT nasal spray Place 2 sprays into both nostrils daily. (Patient taking differently: Place 2 sprays into both nostrils daily as needed for allergies. ) 16 g 11  . glucose blood test strip OneTouch Ultra Test strips    . ibuprofen (ADVIL,MOTRIN) 600 MG tablet Take 1 tablet (600 mg  total) by mouth every 6 (six) hours as needed. 90 tablet 0  . losartan-hydrochlorothiazide (HYZAAR) 100-25 MG tablet Take 1 tablet by mouth daily. 90 tablet 1  . metFORMIN (GLUCOPHAGE) 500 MG tablet Take 1 tablet (500 mg total) by mouth daily with breakfast. 90 tablet 1  . montelukast (SINGULAIR) 10 MG tablet Take 1 tablet (10 mg total) by mouth at bedtime. 90 tablet 1  . Omega-3 Fatty Acids (FISH OIL PO) Take 1 capsule by mouth daily.    [name] DELICA LANCETS 25Z MISC OneTouch Delica Lancets 33 gauge    . pantoprazole (PROTONIX) 40 MG tablet Take 1 tablet (40 mg total) by mouth daily. 90 tablet 1  . potassium chloride SA (K-DUR,KLOR-CON) 20 MEQ tablet Take 1 tablet (20 mEq total) by mouth 3 (three) times daily. 120 tablet 3  . Probiotic Product (PROBIOTIC-10 PO) Take by mouth daily.    . TURMERIC PO Take 1 capsule by mouth daily.     No current facility-administered medications on file prior to visit.     PAST MEDICAL HISTORY: Past Medical History:  Diagnosis Date  . ADD (attention deficit disorder)   . Allergy    allergic rhinitis  . Anemia 07/25/2013  . Back pain   . Costochondritis 02/20/2015  . Diabetes mellitus    Type II   previously 3 years ago - no longer on meds   . Dyslipidemia 07/25/2013  . Fungus infection 10/13   Left great toe  . GERD (gastroesophageal reflux disease)   . Hypertension   . IBS (irritable bowel syndrome)   . Ingrown nail 10/13   right foot next to the last toe  . Insomnia 04/20/2013  . Joint pain   . Lactose intolerance   . Leg edema   . Obesity   . Pedal edema 02/20/2015  . Prediabetes   . Preventative health care 09/25/2015  . PVC (premature ventricular contraction)     PAST SURGICAL HISTORY: Past Surgical History:  Procedure Laterality Date  . HYSTEROSCOPY N/A 10/04/2015   Procedure: HYSTEROSCOPY with Removal IUD;  Surgeon: Vanessa Kick, MD;  Location: [facility];  Service: Gynecology;  Laterality: N/A;  . lasik     eye surgery  . WISDOM  TOOTH EXTRACTION      SOCIAL HISTORY: Social History   Tobacco Use  . Smoking status: Former Smoker    Packs/day: 1.00    Years: 15.00    Pack years: 15.00    Last attempt to quit: 09/03/2001    Years since quitting: 16.0  . Smokeless tobacco: Never Used  Substance Use Topics  . Alcohol use: Yes    Comment: rare  . Drug use: No    FAMILY HISTORY: Family History  Problem Relation Age of Onset  . Heart attack Mother 61  . Hypertension Mother   . Heart disease Mother   . Obesity Mother   . Cancer Father        prostate  . Heart attack Sister 65  . Hypertension Other  ROS: Review of Systems  Constitutional: Negative for weight loss.  Respiratory: Negative for shortness of breath (on exertion).   Cardiovascular: Negative for chest pain.  Gastrointestinal: Negative for nausea and vomiting.  Musculoskeletal:       Negative muscle weakness  Endo/Heme/Allergies:       Negative hypoglycemia    PHYSICAL EXAM: Blood pressure (!) 144/82, pulse 74, temperature 98.4 F (36.9 C), temperature source Oral, height 5\' 9"  (1.753 m), weight (!) 302 lb (137 kg), SpO2 96 %. Body mass index is 44.6 kg/m. Physical Exam  Constitutional: She is oriented to person, place, and time. She appears well-developed and well-nourished.  Cardiovascular: Normal rate.  Pulmonary/Chest: Effort normal.  Musculoskeletal: Normal range of motion.  Neurological: She is oriented to person, place, and time.  Skin: Skin is warm and dry.  Psychiatric: She has a normal mood and affect. Her behavior is normal.  Vitals reviewed.   RECENT LABS AND TESTS: BMET    Component Value Date/Time   NA 139 05/23/2017 1025   K 3.6 05/23/2017 1025   CL 100 05/23/2017 1025   CO2 25 05/23/2017 1025   GLUCOSE 100 (H) 05/23/2017 1025   GLUCOSE 107 (H) 03/15/2017 1140   BUN 9 05/23/2017 1025   CREATININE 0.71 05/23/2017 1025   CREATININE 0.72 02/03/2014 1648   CALCIUM 9.2 05/23/2017 1025   GFRNONAA 100  05/23/2017 1025   GFRAA 116 05/23/2017 1025   Lab Results  Component Value Date   HGBA1C 6.2 (H) 05/23/2017   HGBA1C 6.6 (H) 03/15/2017   HGBA1C 6.3 11/06/2016   HGBA1C 6.2 07/18/2016   HGBA1C 6.0 03/15/2016   Lab Results  Component Value Date   INSULIN 31.1 (H) 05/23/2017   CBC    Component Value Date/Time   WBC 7.0 05/23/2017 1025   WBC 6.7 03/15/2017 1140   RBC 4.46 05/23/2017 1025   RBC 4.49 03/15/2017 1140   HGB 12.1 05/23/2017 1025   HCT 36.6 05/23/2017 1025   PLT 273.0 03/15/2017 1140   MCV 82 05/23/2017 1025   MCH 27.1 05/23/2017 1025   MCH 28.2 02/03/2014 1648   MCHC 33.1 05/23/2017 1025   MCHC 34.0 03/15/2017 1140   RDW 14.9 05/23/2017 1025   LYMPHSABS 1.7 05/23/2017 1025   MONOABS 0.4 06/09/2014 0843   EOSABS 0.1 05/23/2017 1025   BASOSABS 0.0 05/23/2017 1025   Iron/TIBC/Ferritin/ %Sat No results found for: IRON, TIBC, FERRITIN, IRONPCTSAT Lipid Panel     Component Value Date/Time   CHOL 139 05/23/2017 1025   TRIG 113 05/23/2017 1025   HDL 34 (L) 05/23/2017 1025   CHOLHDL 4 03/15/2017 1140   VLDL 26.6 03/15/2017 1140   LDLCALC 82 05/23/2017 1025   Hepatic Function Panel     Component Value Date/Time   PROT 7.3 05/23/2017 1025   ALBUMIN 4.3 05/23/2017 1025   AST 8 05/23/2017 1025   ALT 12 05/23/2017 1025   ALKPHOS 85 05/23/2017 1025   BILITOT 0.2 05/23/2017 1025   BILIDIR 0.0 06/09/2014 0843   IBILI 0.1 (L) 02/03/2014 1648      Component Value Date/Time   TSH 1.710 05/23/2017 1025   TSH 1.26 03/15/2017 1140   TSH 1.68 11/06/2016 1438     Ref. Range 05/23/2017 10:25  Vitamin D, 25-Hydroxy Latest Ref Range: 30.0 - 100.0 ng/mL 14.9 (L)    ASSESSMENT AND PLAN: Essential hypertension  Vitamin D deficiency - Plan: VITAMIN D 25 Hydroxy (Vit-D Deficiency, Fractures), Vitamin D, Ergocalciferol, (DRISDOL) 50000 units CAPS capsule  Type 2 diabetes mellitus without complication, without long-term current use of insulin (HCC) - Plan:  Comprehensive metabolic panel, Hemoglobin A1c, Insulin, random  Hypertriglyceridemia - Plan: Lipid Panel With LDL/HDL Ratio  History of hypokalemia  Class 3 severe obesity with serious comorbidity and body mass index (BMI) of 40.0 to 44.9 in adult, unspecified obesity type ([place])  PLAN:  Hypertension We discussed sodium restriction, working on healthy weight loss, and a regular exercise program as the means to achieve improved blood pressure control. Carlisia agreed with this plan and agreed to follow up as directed. We will continue to monitor her blood pressure as well as her progress with the above lifestyle modifications. She will continue her medications as prescribed and will watch for signs of hypotension as she continues her lifestyle modifications.  Vitamin D Deficiency Saige was informed that low vitamin D levels contributes to fatigue and are associated with obesity, breast, and colon cancer. She agrees to continue to take prescription Vit D @50 ,000 IU every week #4 with no refills and will follow up for routine testing of vitamin D, at least 2-3 times per year. She was informed of the risk of over-replacement of vitamin D and agrees to not increase her dose unless he discusses this with [name] first. Versie agrees to follow up with our clinic in 2 weeks.  Diabetes II non insulin without complications Dakia has been given extensive diabetes education by myself today including ideal fasting and post-prandial blood glucose readings, individual ideal Hgb A1c goals  and hypoglycemia prevention. We discussed the importance of good blood sugar control to decrease the likelihood of diabetic complications such as nephropathy, neuropathy, limb loss, blindness, coronary artery disease, and death. We discussed the importance of intensive lifestyle modification including diet, exercise and weight loss as the first line treatment for diabetes. Rupinder agrees to continue her diabetes medications and  will follow up at the agreed upon time.  Hypertriglyceridemia (history of) Iverna was informed of the American Heart Association Guidelines emphasizing intensive lifestyle modifications as the first line treatment for hypertriglyceridemia. We discussed many lifestyle modifications today in depth, and Kamie will continue to work on decreasing saturated fats such as fatty red meat, butter and many fried foods. She will also increase vegetables and lean protein in her diet and continue to work on exercise and weight loss efforts. We will check lipid panel and Pete will follow up at the agreed upon time.  Hypokalemia We will check labs and Tacey agrees to follow up with our clinic in 2 weeks.  Obesity Chayce is currently in the action stage of change. As such, her goal is to continue with weight loss efforts She has agreed to keep a food journal with 1500 calories and 95 grams of protein daily Jazlynn has been instructed to work up to a goal of 150 minutes of combined cardio and strengthening exercise per week for weight loss and overall health benefits. We discussed the following Behavioral Modification Strategies today: increasing lean protein intake and planning for success  Halena has agreed to follow up with our clinic in 2 weeks. She was informed of the importance of frequent follow up visits to maximize her success with intensive lifestyle modifications for her multiple health conditions.  Corey Skains, am acting as transcriptionist for Lacy Duverney, [facility] have reviewed this note and agree with its contents  OBESITY BEHAVIORAL INTERVENTION VISIT  Today's visit was # 8 out of 22.  Starting weight: 325 lbs Starting  date: 05/23/17 Today's weight : 302 lbs  Today's date: 09/12/2017 Total lbs lost to date: 23 (Patients must lose 7 lbs in the first 6 months to continue with counseling)   ASK: We discussed the diagnosis of obesity with [name] today  and Calista agreed to give [name] permission to discuss obesity behavioral modification therapy today.  ASSESS: Neyda has the diagnosis of obesity and her BMI today is 44.58 Talyah is in the action stage of change   ADVISE: Teryl was educated on the multiple health risks of obesity as well as the benefit of weight loss to improve her health. She was advised of the need for long term treatment and the importance of lifestyle modifications.  AGREE: Multiple dietary modification options and treatment options were discussed and  Brittinee agreed to the above obesity treatment plan.  I have reviewed the above documentation for accuracy and completeness, and I agree with the above. -Dennard Nip, MD

## 2017-09-13 LAB — LIPID PANEL WITH LDL/HDL RATIO
Cholesterol, Total: 132 mg/dL (ref 100–199)
HDL: 32 mg/dL — AB (ref >39)
LDL Calculated: 81 mg/dL (ref 0–99)
LDl/HDL Ratio: 2.5 ratio (ref 0.0–3.2)
TRIGLYCERIDES: 93 mg/dL (ref 0–149)
VLDL Cholesterol Cal: 19 mg/dL (ref 5–40)

## 2017-09-13 LAB — COMPREHENSIVE METABOLIC PANEL
ALK PHOS: 78 IU/L (ref 39–117)
ALT: 9 IU/L (ref 0–32)
AST: 8 IU/L (ref 0–40)
Albumin/Globulin Ratio: 1.5 (ref 1.2–2.2)
Albumin: 4.1 g/dL (ref 3.5–5.5)
BUN/Creatinine Ratio: 15 (ref 9–23)
BUN: 10 mg/dL (ref 6–24)
Bilirubin Total: 0.2 mg/dL (ref 0.0–1.2)
CO2: 25 mmol/L (ref 20–29)
CREATININE: 0.65 mg/dL (ref 0.57–1.00)
Calcium: 9 mg/dL (ref 8.7–10.2)
Chloride: 101 mmol/L (ref 96–106)
GFR calc Af Amer: 121 mL/min/{1.73_m2} (ref >59)
GFR calc non Af Amer: 105 mL/min/{1.73_m2} (ref >59)
GLOBULIN, TOTAL: 2.8 g/dL (ref 1.5–4.5)
GLUCOSE: 85 mg/dL (ref 65–99)
Potassium: 3.6 mmol/L (ref 3.5–5.2)
SODIUM: 137 mmol/L (ref 134–144)
Total Protein: 6.9 g/dL (ref 6.0–8.5)

## 2017-09-13 LAB — HEMOGLOBIN A1C
Est. average glucose Bld gHb Est-mCnc: 120 mg/dL
Hgb A1c MFr Bld: 5.8 % — ABNORMAL HIGH (ref 4.8–5.6)

## 2017-09-13 LAB — VITAMIN D 25 HYDROXY (VIT D DEFICIENCY, FRACTURES): VIT D 25 HYDROXY: 27.9 ng/mL — AB (ref 30.0–100.0)

## 2017-09-13 LAB — INSULIN, RANDOM: INSULIN: 12.9 u[IU]/mL (ref 2.6–24.9)

## 2017-09-17 ENCOUNTER — Ambulatory Visit (INDEPENDENT_AMBULATORY_CARE_PROVIDER_SITE_OTHER): Payer: BC Managed Care – PPO | Admitting: *Deleted

## 2017-09-17 DIAGNOSIS — J309 Allergic rhinitis, unspecified: Secondary | ICD-10-CM

## 2017-09-19 ENCOUNTER — Encounter: Payer: Self-pay | Admitting: Family Medicine

## 2017-09-26 ENCOUNTER — Ambulatory Visit (INDEPENDENT_AMBULATORY_CARE_PROVIDER_SITE_OTHER): Payer: BC Managed Care – PPO | Admitting: Physician Assistant

## 2017-09-26 ENCOUNTER — Ambulatory Visit (INDEPENDENT_AMBULATORY_CARE_PROVIDER_SITE_OTHER): Payer: BC Managed Care – PPO | Admitting: *Deleted

## 2017-09-26 VITALS — BP 129/79 | HR 95 | Temp 98.8°F | Ht 69.0 in | Wt 297.0 lb

## 2017-09-26 DIAGNOSIS — Z6841 Body Mass Index (BMI) 40.0 and over, adult: Secondary | ICD-10-CM

## 2017-09-26 DIAGNOSIS — E559 Vitamin D deficiency, unspecified: Secondary | ICD-10-CM | POA: Diagnosis not present

## 2017-09-26 DIAGNOSIS — J309 Allergic rhinitis, unspecified: Secondary | ICD-10-CM | POA: Diagnosis not present

## 2017-09-26 DIAGNOSIS — I10 Essential (primary) hypertension: Secondary | ICD-10-CM | POA: Diagnosis not present

## 2017-09-26 MED ORDER — VITAMIN D (ERGOCALCIFEROL) 1.25 MG (50000 UNIT) PO CAPS
50000.0000 [IU] | ORAL_CAPSULE | ORAL | 0 refills | Status: DC
Start: 2017-09-26 — End: 2017-11-11

## 2017-09-26 NOTE — Progress Notes (Signed)
Office: [phone]  /  Fax: [phone]   HPI:   Chief Complaint: OBESITY Brandi Lester is here to discuss her progress with her obesity treatment plan. She is on the keep a food journal with 1500 calories and 95 grams of protein daily and is following her eating plan approximately 89 % of the time. She states she is walking for 30 minutes 3 to 4 times per week. Brandi Lester continues to do well with weight loss. She is mindful of her eating and continues to stay within her calorie and protein goals. Her weight is 297 lb (134.7 kg) today and has had a weight loss of 5 pounds over a period of 2 weeks since her last visit. She has lost 28 lbs since starting treatment with [name].  Vitamin D deficiency Brandi Lester has a diagnosis of vitamin D deficiency. She is currently taking vit D and denies nausea, vomiting or muscle weakness.   Ref. Range 09/12/2017 09:27  Vitamin D, 25-Hydroxy Latest Ref Range: 30.0 - 100.0 ng/mL 27.9 (L)   Hypertension Brandi Lester is a 52 y.o. female with hypertension. [name] denies chest pain or shortness of breath on exertion. She is working weight loss to help control her blood pressure with the goal of decreasing her risk of heart attack and stroke. [name] blood pressure is currently stable.    ALLERGIES: Allergies  Allergen Reactions  . Penicillins Itching and Swelling    Has patient had a PCN reaction causing immediate rash, facial/tongue/throat swelling, SOB or lightheadedness with hypotension: yes Has patient had a PCN reaction causing severe rash involving mucus membranes or skin necrosis: no Has patient had a PCN reaction that required hospitalization no Has patient had a PCN reaction occurring within the last 10 years: yes If all of the above answers are "NO", then may proceed with Cephalosporin use.   Brandi Lester [Liraglutide]     Vomiting    MEDICATIONS: Current Outpatient Medications on File Prior to Visit  Medication Sig Dispense Refill  .  acetaminophen (TYLENOL 8 HOUR) 650 MG CR tablet Take 1 tablet (650 mg total) every 8 (eight) hours as needed by mouth for pain.    [name] amLODipine (NORVASC) 5 MG tablet Take 1 tablet (5 mg total) by mouth daily. 90 tablet 1  . aspirin EC 81 MG tablet Take 1 tablet (81 mg total) by mouth daily.    [name] azelastine (ASTELIN) 0.1 % nasal spray Place 2 sprays into both nostrils 2 (two) times daily. Use in each nostril as directed 90 mL 1  . Cyanocobalamin (B-12) 1000 MCG CAPS Take by mouth daily.    [name] EPINEPHrine (EPIPEN 2-PAK) 0.3 mg/0.3 mL IJ SOAJ injection Use as directed for severe allergic reaction 2 Device 1  . fexofenadine (ALLEGRA ALLERGY) 180 MG tablet Take 1 tablet (180 mg total) by mouth daily. 301 tablet 1  . fluticasone (FLONASE) 50 MCG/ACT nasal spray Place 2 sprays into both nostrils daily. (Patient taking differently: Place 2 sprays into both nostrils daily as needed for allergies. ) 16 g 11  . glucose blood test strip OneTouch Ultra Test strips    . ibuprofen (ADVIL,MOTRIN) 600 MG tablet Take 1 tablet (600 mg total) by mouth every 6 (six) hours as needed. 90 tablet 0  . losartan-hydrochlorothiazide (HYZAAR) 100-25 MG tablet TAKE 1 TABLET BY MOUTH DAILY. 90 tablet 1  . metFORMIN (GLUCOPHAGE) 500 MG tablet TAKE 1 TABLET (500 MG TOTAL) BY MOUTH DAILY WITH BREAKFAST. 90 tablet 1  . montelukast (  SINGULAIR) 10 MG tablet Take 1 tablet (10 mg total) by mouth at bedtime. 90 tablet 1  . Omega-3 Fatty Acids (FISH OIL PO) Take 1 capsule by mouth daily.    [name] DELICA LANCETS 18E MISC OneTouch Delica Lancets 33 gauge    . pantoprazole (PROTONIX) 40 MG tablet Take 1 tablet (40 mg total) by mouth daily. 90 tablet 1  . potassium chloride SA (K-DUR,KLOR-CON) 20 MEQ tablet Take 1 tablet (20 mEq total) by mouth 3 (three) times daily. 120 tablet 3  . Probiotic Product (PROBIOTIC-10 PO) Take by mouth daily.    . TURMERIC PO Take 1 capsule by mouth daily.    . Vitamin D, Ergocalciferol, (DRISDOL) 50000  units CAPS capsule Take 1 capsule (50,000 Units total) by mouth every 7 (seven) days. 4 capsule 0   No current facility-administered medications on file prior to visit.     PAST MEDICAL HISTORY: Past Medical History:  Diagnosis Date  . ADD (attention deficit disorder)   . Allergy    allergic rhinitis  . Anemia 07/25/2013  . Back pain   . Costochondritis 02/20/2015  . Diabetes mellitus    Type II   previously 3 years ago - no longer on meds   . Dyslipidemia 07/25/2013  . Fungus infection 10/13   Left great toe  . GERD (gastroesophageal reflux disease)   . Hypertension   . IBS (irritable bowel syndrome)   . Ingrown nail 10/13   right foot next to the last toe  . Insomnia 04/20/2013  . Joint pain   . Lactose intolerance   . Leg edema   . Obesity   . Pedal edema 02/20/2015  . Prediabetes   . Preventative health care 09/25/2015  . PVC (premature ventricular contraction)     PAST SURGICAL HISTORY: Past Surgical History:  Procedure Laterality Date  . HYSTEROSCOPY N/A 10/04/2015   Procedure: HYSTEROSCOPY with Removal IUD;  Surgeon: Vanessa Kick, MD;  Location: [facility];  Service: Gynecology;  Laterality: N/A;  . lasik     eye surgery  . WISDOM TOOTH EXTRACTION      SOCIAL HISTORY: Social History   Tobacco Use  . Smoking status: Former Smoker    Packs/day: 1.00    Years: 15.00    Pack years: 15.00    Last attempt to quit: 09/03/2001    Years since quitting: 16.0  . Smokeless tobacco: Never Used  Substance Use Topics  . Alcohol use: Yes    Comment: rare  . Drug use: No    FAMILY HISTORY: Family History  Problem Relation Age of Onset  . Heart attack Mother 59  . Hypertension Mother   . Heart disease Mother   . Obesity Mother   . Cancer Father        prostate  . Heart attack Sister 56  . Hypertension Other     ROS: Review of Systems  Constitutional: Positive for weight loss.  Respiratory: Negative for shortness of breath (on exertion).   Cardiovascular:  Negative for chest pain.  Gastrointestinal: Negative for nausea and vomiting.  Musculoskeletal:       Negative muscle weakness     PHYSICAL EXAM: Blood pressure 129/79, pulse 95, temperature 98.8 F (37.1 C), temperature source Oral, height 5\' 9"  (1.753 m), weight 297 lb (134.7 kg), SpO2 96 %. Body mass index is 43.86 kg/m. Physical Exam  Constitutional: She is oriented to person, place, and time. She appears well-developed and well-nourished.  Cardiovascular: Normal rate.  Pulmonary/Chest:  Effort normal.  Musculoskeletal: Normal range of motion.  Neurological: She is oriented to person, place, and time.  Skin: Skin is warm and dry.  Psychiatric: She has a normal mood and affect. Her behavior is normal.  Vitals reviewed.   RECENT LABS AND TESTS: BMET    Component Value Date/Time   NA 137 09/12/2017 0927   K 3.6 09/12/2017 0927   CL 101 09/12/2017 0927   CO2 25 09/12/2017 0927   GLUCOSE 85 09/12/2017 0927   GLUCOSE 107 (H) 03/15/2017 1140   BUN 10 09/12/2017 0927   CREATININE 0.65 09/12/2017 0927   CREATININE 0.72 02/03/2014 1648   CALCIUM 9.0 09/12/2017 0927   GFRNONAA 105 09/12/2017 0927   GFRAA 121 09/12/2017 0927   Lab Results  Component Value Date   HGBA1C 5.8 (H) 09/12/2017   HGBA1C 6.2 (H) 05/23/2017   HGBA1C 6.6 (H) 03/15/2017   HGBA1C 6.3 11/06/2016   HGBA1C 6.2 07/18/2016   Lab Results  Component Value Date   INSULIN 12.9 09/12/2017   INSULIN 31.1 (H) 05/23/2017   CBC    Component Value Date/Time   WBC 7.0 05/23/2017 1025   WBC 6.7 03/15/2017 1140   RBC 4.46 05/23/2017 1025   RBC 4.49 03/15/2017 1140   HGB 12.1 05/23/2017 1025   HCT 36.6 05/23/2017 1025   PLT 273.0 03/15/2017 1140   MCV 82 05/23/2017 1025   MCH 27.1 05/23/2017 1025   MCH 28.2 02/03/2014 1648   MCHC 33.1 05/23/2017 1025   MCHC 34.0 03/15/2017 1140   RDW 14.9 05/23/2017 1025   LYMPHSABS 1.7 05/23/2017 1025   MONOABS 0.4 06/09/2014 0843   EOSABS 0.1 05/23/2017 1025    BASOSABS 0.0 05/23/2017 1025   Iron/TIBC/Ferritin/ %Sat No results found for: IRON, TIBC, FERRITIN, IRONPCTSAT Lipid Panel     Component Value Date/Time   CHOL 132 09/12/2017 0927   TRIG 93 09/12/2017 0927   HDL 32 (L) 09/12/2017 0927   CHOLHDL 4 03/15/2017 1140   VLDL 26.6 03/15/2017 1140   LDLCALC 81 09/12/2017 0927   Hepatic Function Panel     Component Value Date/Time   PROT 6.9 09/12/2017 0927   ALBUMIN 4.1 09/12/2017 0927   AST 8 09/12/2017 0927   ALT 9 09/12/2017 0927   ALKPHOS 78 09/12/2017 0927   BILITOT 0.2 09/12/2017 0927   BILIDIR 0.0 06/09/2014 0843   IBILI 0.1 (L) 02/03/2014 1648      Component Value Date/Time   TSH 1.710 05/23/2017 1025   TSH 1.26 03/15/2017 1140   TSH 1.68 11/06/2016 1438     Ref. Range 09/12/2017 09:27  Vitamin D, 25-Hydroxy Latest Ref Range: 30.0 - 100.0 ng/mL 27.9 (L)   ASSESSMENT AND PLAN: Vitamin D deficiency - Plan: Vitamin D, Ergocalciferol, (DRISDOL) 50000 units CAPS capsule  Essential hypertension  Class 3 severe obesity with serious comorbidity and body mass index (BMI) of 40.0 to 44.9 in adult, unspecified obesity type (HCC)  PLAN:  Vitamin D Deficiency Brandi Lester was informed that low vitamin D levels contributes to fatigue and are associated with obesity, breast, and colon cancer. She agrees to continue to take prescription Vit D @50 ,000 IU every week #4 with no refills and will follow up for routine testing of vitamin D, at least 2-3 times per year. She was informed of the risk of over-replacement of vitamin D and agrees to not increase her dose unless she discusses this with [name] first. Brandi Lester agrees to follow up with our clinic in 2 weeks.  Hypertension We discussed sodium restriction, working on healthy weight loss, and a regular exercise program as the means to achieve improved blood pressure control. Brandi Lester agreed with this plan and agreed to follow up as directed. We will continue to monitor her blood pressure as well  as her progress with the above lifestyle modifications. She will continue her medications as prescribed and will watch for signs of hypotension as she continues her lifestyle modifications.  Obesity Brandi Lester is currently in the action stage of change. As such, her goal is to continue with weight loss efforts She has agreed to keep a food journal with 1500 calories and 95 grams of protein daily Brandi Lester has been instructed to work up to a goal of 150 minutes of combined cardio and strengthening exercise per week for weight loss and overall health benefits. We discussed the following Behavioral Modification Strategies today: increasing lean protein intake and work on meal planning and easy cooking plans  Brandi Lester has agreed to follow up with our clinic in 2 weeks. She was informed of the importance of frequent follow up visits to maximize her success with intensive lifestyle modifications for her multiple health conditions.    OBESITY BEHAVIORAL INTERVENTION VISIT  Today's visit was # 9 out of 22.  Starting weight: 325 lbs Starting date: 05/23/17 Today's weight : 297 lbs Today's date: 09/26/2017 Total lbs lost to date: 6 (Patients must lose 7 lbs in the first 6 months to continue with counseling)   ASK: We discussed the diagnosis of obesity with [name] today and Gracianna agreed to give [name] permission to discuss obesity behavioral modification therapy today.  ASSESS: Brandi Lester has the diagnosis of obesity and her BMI today is 43.84 Brandi Lester is in the action stage of change   ADVISE: Brandi Lester was educated on the multiple health risks of obesity as well as the benefit of weight loss to improve her health. She was advised of the need for long term treatment and the importance of lifestyle modifications.  AGREE: Multiple dietary modification options and treatment options were discussed and  Brandi Lester agreed to the above obesity treatment plan.   Corey Skains, am acting as  transcriptionist for [employer], PA-C I, Lacy Duverney [facility], have reviewed this note and agree with its contents.

## 2017-10-01 ENCOUNTER — Encounter: Payer: Self-pay | Admitting: Internal Medicine

## 2017-10-01 ENCOUNTER — Ambulatory Visit: Payer: BC Managed Care – PPO | Admitting: Internal Medicine

## 2017-10-01 VITALS — BP 126/70 | HR 76 | Temp 98.5°F | Resp 14 | Ht 69.0 in | Wt 308.4 lb

## 2017-10-01 DIAGNOSIS — J019 Acute sinusitis, unspecified: Secondary | ICD-10-CM

## 2017-10-01 MED ORDER — AZITHROMYCIN 250 MG PO TABS: ORAL_TABLET | ORAL | 0 refills | Status: DC

## 2017-10-01 MED FILL — AZITHROMYCIN 250 MG TABLET: 250 | 5 days supply | Qty: 6 | Fill #0

## 2017-10-01 NOTE — Patient Instructions (Signed)
Rest, fluids , tylenol  If  cough:  Take Mucinex DM twice a day as needed until better  For nasal congestion: Use   Flonase : 2 nasal sprays on each side of the nose in the morning until you feel better Use ASTELIN : 2 nasal sprays on each side of the nose twice a day until you feel better  Avoid decongestants such as  Pseudoephedrine or phenylephrine   Take the antibiotic as prescribed  (zithromax)  Call if not gradually better over the next  10 days  Call anytime if the symptoms are severe

## 2017-10-01 NOTE — Progress Notes (Signed)
Pre visit review using our clinic review tool, if applicable. No additional management support is needed unless otherwise documented below in the visit note. 

## 2017-10-01 NOTE — Progress Notes (Signed)
Subjective:    Patient ID: Brandi Lester, female    DOB: April 14, 1966, 52 y.o.   MRN: [id number]  DOS:  10/01/2017 Type of visit - description : Acute visit Interval history: Having upper respiratory symptoms for the last 2-3 weeks, symptoms are on and off today she came in because now has , bilateral sinus and frontal pain/congestion. Took ibuprofen today and  did help a little. Also complaining of lack of energy. 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  Review of Systems Denies fever chills Minimal sore throat Minimal cough + Postnasal dripping, no chest congestion.  Past Medical History:  Diagnosis Date  . ADD (attention deficit disorder)   . Allergy    allergic rhinitis  . Anemia 07/25/2013  . Back pain   . Costochondritis 02/20/2015  . Diabetes mellitus    Type II   previously 3 years ago - no longer on meds   . Dyslipidemia 07/25/2013  . Fungus infection 10/13   Left great toe  . GERD (gastroesophageal reflux disease)   . Hypertension   . IBS (irritable bowel syndrome)   . Ingrown nail 10/13   right foot next to the last toe  . Insomnia 04/20/2013  . Joint pain   . Lactose intolerance   . Leg edema   . Obesity   . Pedal edema 02/20/2015  . Prediabetes   . Preventative health care 09/25/2015  . PVC (premature ventricular contraction)     Past Surgical History:  Procedure Laterality Date  . HYSTEROSCOPY N/A 10/04/2015   Procedure: HYSTEROSCOPY with Removal IUD;  Surgeon: Vanessa Kick, MD;  Location: [facility];  Service: Gynecology;  Laterality: N/A;  . lasik     eye surgery  . WISDOM TOOTH EXTRACTION      Social History   Socioeconomic History  . Marital status: Single    Spouse name: Not on file  . Number of children: Not on file  . Years of education: Not on file  . Highest education level: Not on file  Social Needs  . Financial resource strain: Not on file  . Food insecurity - worry: Not on file  . Food insecurity - inability: Not on file   . Transportation needs - medical: Not on file  . Transportation needs - non-medical: Not on file  Occupational History  . Occupation: Pharmacist, hospital, [employer]  Tobacco Use  . Smoking status: Former Smoker    Packs/day: 1.00    Years: 15.00    Pack years: 15.00    Last attempt to quit: 09/03/2001    Years since quitting: 16.0  . Smokeless tobacco: Never Used  Substance and Sexual Activity  . Alcohol use: Yes    Comment: rare  . Drug use: No  . Sexual activity: Not on file  Other Topics Concern  . Not on file  Social History Narrative  . Not on file      Allergies as of 10/01/2017      Reactions   Penicillins Itching, Swelling   Has patient had a PCN reaction causing immediate rash, facial/tongue/throat swelling, SOB or lightheadedness with hypotension: yes Has patient had a PCN reaction causing severe rash involving mucus membranes or skin necrosis: no Has patient had a PCN reaction that required hospitalization no Has patient had a PCN reaction occurring within the last 10 years: yes If all of the above answers are "NO", then may proceed with Cephalosporin use.   Victoza [liraglutide]    Vomiting  Medication List        Accurate as of 10/01/17 11:59 PM. Always use your most recent med list.          acetaminophen 650 MG CR tablet Commonly known as:  TYLENOL 8 HOUR Take 1 tablet (650 mg total) every 8 (eight) hours as needed by mouth for pain.   amLODipine 5 MG tablet Commonly known as:  NORVASC Take 1 tablet (5 mg total) by mouth daily.   aspirin EC 81 MG tablet Take 1 tablet (81 mg total) by mouth daily.   azelastine 0.1 % nasal spray Commonly known as:  ASTELIN Place 2 sprays into both nostrils 2 (two) times daily. Use in each nostril as directed   azithromycin 250 MG tablet Commonly known as:  ZITHROMAX Z-PAK 2 tabs a day the first day, then 1 tab a day x 4 days   B-12 1000 MCG Caps Take by mouth daily.   EPINEPHrine 0.3 mg/0.3 mL Soaj  injection Commonly known as:  EPIPEN 2-PAK Use as directed for severe allergic reaction   fexofenadine 180 MG tablet Commonly known as:  ALLEGRA ALLERGY Take 1 tablet (180 mg total) by mouth daily.   FISH OIL PO Take 1 capsule by mouth daily.   fluticasone 50 MCG/ACT nasal spray Commonly known as:  FLONASE Place 2 sprays into both nostrils daily.   glucose blood test strip OneTouch Ultra Test strips   ibuprofen 600 MG tablet Commonly known as:  ADVIL,MOTRIN Take 1 tablet (600 mg total) by mouth every 6 (six) hours as needed.   losartan-hydrochlorothiazide 100-25 MG tablet Commonly known as:  HYZAAR TAKE 1 TABLET BY MOUTH DAILY.   metFORMIN 500 MG tablet Commonly known as:  GLUCOPHAGE TAKE 1 TABLET (500 MG TOTAL) BY MOUTH DAILY WITH BREAKFAST.   montelukast 10 MG tablet Commonly known as:  SINGULAIR Take 1 tablet (10 mg total) by mouth at bedtime.   ONETOUCH DELICA LANCETS 62Z Misc OneTouch Delica Lancets 33 gauge   pantoprazole 40 MG tablet Commonly known as:  PROTONIX Take 1 tablet (40 mg total) by mouth daily.   potassium chloride SA 20 MEQ tablet Commonly known as:  K-DUR,KLOR-CON Take 1 tablet (20 mEq total) by mouth 3 (three) times daily.   PROBIOTIC-10 PO Take by mouth daily.   TURMERIC PO Take 1 capsule by mouth daily.   Vitamin D (Ergocalciferol) 50000 units Caps capsule Commonly known as:  DRISDOL Take 1 capsule (50,000 Units total) by mouth every 7 (seven) days.          Objective:   Physical Exam BP 126/70 (BP Location: Left Arm, Patient Position: Sitting, Cuff Size: Normal)   Pulse 76   Temp 98.5 F (36.9 C) (Oral)   Resp 14   Ht 5\' 9"  (1.753 m)   Wt (!) 308 lb 6 oz (139.9 kg)   BMI 45.54 kg/m   General:   Well developed,   NAD.  HEENT:  Normocephalic . Face symmetric, atraumatic. Sinuses TTP bilaterally.   Lungs:  CTA B Normal respiratory effort, no intercostal retractions, no accessory muscle use. Heart: RRR,  no murmur.   Trace  pretibial edema bilaterally  Skin: Not pale. Not jaundice Neurologic:  alert & oriented X3.  Speech normal, gait appropriate for age and unassisted Psych--  Cognition and judgment appear intact.  Cooperative with normal attention span and concentration.  Behavior appropriate. No anxious or depressed appearing.      Assessment & Plan:   52 year old female with multiple  medical problems including hypertension, morbid obesity, DM, high cholesterol, presents with: Sinusitis:  Sx c/w sinusitis, allergic to penicillin, will try Z-Pak, continue Flonase, Astelin.  Avoid decongestants.  See AVS.

## 2017-10-03 ENCOUNTER — Ambulatory Visit (INDEPENDENT_AMBULATORY_CARE_PROVIDER_SITE_OTHER): Payer: BC Managed Care – PPO | Admitting: *Deleted

## 2017-10-03 DIAGNOSIS — J309 Allergic rhinitis, unspecified: Secondary | ICD-10-CM | POA: Diagnosis not present

## 2017-10-10 ENCOUNTER — Ambulatory Visit (INDEPENDENT_AMBULATORY_CARE_PROVIDER_SITE_OTHER): Payer: BC Managed Care – PPO | Admitting: Physician Assistant

## 2017-10-10 VITALS — BP 129/79 | HR 91 | Temp 98.8°F | Ht 69.0 in | Wt 301.0 lb

## 2017-10-10 DIAGNOSIS — E559 Vitamin D deficiency, unspecified: Secondary | ICD-10-CM

## 2017-10-10 DIAGNOSIS — Z6841 Body Mass Index (BMI) 40.0 and over, adult: Secondary | ICD-10-CM

## 2017-10-10 NOTE — Progress Notes (Signed)
Office: [phone]  /  Fax: [phone]   HPI:   Chief Complaint: OBESITY Brandi Lester is here to discuss her progress with her obesity treatment plan. She is on the keep a food journal with 1500 calories and 95 grams of protein daily and is following her eating plan approximately 85 % of the time. She states she is walking 30 minutes 3 to 4 times per week. Brandi Lester states she has been staying within her calories and protein goal, and has been keeping a journal with all her eating. Brandi Lester states she has not been drinking as much water. Her weight is (!) 301 lb (136.5 kg) today and has had a weight gain of 4 pounds over a period of 2 weeks since her last visit. She has lost 24 lbs since starting treatment with [name].  Vitamin D deficiency [name] has a diagnosis of vitamin D deficiency. She is currently taking vit D and denies nausea, vomiting or muscle weakness.   Ref. Range 09/12/2017 09:27  Vitamin D, 25-Hydroxy Latest Ref Range: 30.0 - 100.0 ng/mL 27.9 (L)   ALLERGIES: Allergies  Allergen Reactions  . Penicillins Itching and Swelling    Has patient had a PCN reaction causing immediate rash, facial/tongue/throat swelling, SOB or lightheadedness with hypotension: yes Has patient had a PCN reaction causing severe rash involving mucus membranes or skin necrosis: no Has patient had a PCN reaction that required hospitalization no Has patient had a PCN reaction occurring within the last 10 years: yes If all of the above answers are "NO", then may proceed with Cephalosporin use.   Brandi Lester [Liraglutide]     Vomiting    MEDICATIONS: Current Outpatient Medications on File Prior to Visit  Medication Sig Dispense Refill  . acetaminophen (TYLENOL 8 HOUR) 650 MG CR tablet Take 1 tablet (650 mg total) every 8 (eight) hours as needed by mouth for pain.    [name] amLODipine (NORVASC) 5 MG tablet Take 1 tablet (5 mg total) by mouth daily. 90 tablet 1  . aspirin EC 81 MG tablet Take 1 tablet (81 mg  total) by mouth daily.    [name] azelastine (ASTELIN) 0.1 % nasal spray Place 2 sprays into both nostrils 2 (two) times daily. Use in each nostril as directed 90 mL 1  . azithromycin (ZITHROMAX Z-PAK) 250 MG tablet 2 tabs a day the first day, then 1 tab a day x 4 days 6 tablet 0  . Cyanocobalamin (B-12) 1000 MCG CAPS Take by mouth daily.    [name] EPINEPHrine (EPIPEN 2-PAK) 0.3 mg/0.3 mL IJ SOAJ injection Use as directed for severe allergic reaction 2 Device 1  . fexofenadine (ALLEGRA ALLERGY) 180 MG tablet Take 1 tablet (180 mg total) by mouth daily. 301 tablet 1  . fluticasone (FLONASE) 50 MCG/ACT nasal spray Place 2 sprays into both nostrils daily. (Patient taking differently: Place 2 sprays into both nostrils daily as needed for allergies. ) 16 g 11  . glucose blood test strip OneTouch Ultra Test strips    . ibuprofen (ADVIL,MOTRIN) 600 MG tablet Take 1 tablet (600 mg total) by mouth every 6 (six) hours as needed. 90 tablet 0  . losartan-hydrochlorothiazide (HYZAAR) 100-25 MG tablet TAKE 1 TABLET BY MOUTH DAILY. 90 tablet 1  . metFORMIN (GLUCOPHAGE) 500 MG tablet TAKE 1 TABLET (500 MG TOTAL) BY MOUTH DAILY WITH BREAKFAST. 90 tablet 1  . montelukast (SINGULAIR) 10 MG tablet Take 1 tablet (10 mg total) by mouth at bedtime. 90 tablet 1  . Omega-3 Fatty  Acids (FISH OIL PO) Take 1 capsule by mouth daily.    Brandi Lester DELICA LANCETS 40J MISC OneTouch Delica Lancets 33 gauge    . pantoprazole (PROTONIX) 40 MG tablet Take 1 tablet (40 mg total) by mouth daily. 90 tablet 1  . potassium chloride SA (K-DUR,KLOR-CON) 20 MEQ tablet Take 1 tablet (20 mEq total) by mouth 3 (three) times daily. 120 tablet 3  . Probiotic Product (PROBIOTIC-10 PO) Take by mouth daily.    . TURMERIC PO Take 1 capsule by mouth daily.    . Vitamin D, Ergocalciferol, (DRISDOL) 50000 units CAPS capsule Take 1 capsule (50,000 Units total) by mouth every 7 (seven) days. 4 capsule 0   No current facility-administered medications on file prior  to visit.     PAST MEDICAL HISTORY: Past Medical History:  Diagnosis Date  . ADD (attention deficit disorder)   . Allergy    allergic rhinitis  . Anemia 07/25/2013  . Back pain   . Costochondritis 02/20/2015  . Diabetes mellitus    Type II   previously 3 years ago - no longer on meds   . Dyslipidemia 07/25/2013  . Fungus infection 10/13   Left great toe  . GERD (gastroesophageal reflux disease)   . Hypertension   . IBS (irritable bowel syndrome)   . Ingrown nail 10/13   right foot next to the last toe  . Insomnia 04/20/2013  . Joint pain   . Lactose intolerance   . Leg edema   . Obesity   . Pedal edema 02/20/2015  . Prediabetes   . Preventative health care 09/25/2015  . PVC (premature ventricular contraction)     PAST SURGICAL HISTORY: Past Surgical History:  Procedure Laterality Date  . HYSTEROSCOPY N/A 10/04/2015   Procedure: HYSTEROSCOPY with Removal IUD;  Surgeon: Vanessa Kick, MD;  Location: [facility];  Service: Gynecology;  Laterality: N/A;  . lasik     eye surgery  . WISDOM TOOTH EXTRACTION      SOCIAL HISTORY: Social History   Tobacco Use  . Smoking status: Former Smoker    Packs/day: 1.00    Years: 15.00    Pack years: 15.00    Last attempt to quit: 09/03/2001    Years since quitting: 16.1  . Smokeless tobacco: Never Used  Substance Use Topics  . Alcohol use: Yes    Comment: rare  . Drug use: No    FAMILY HISTORY: Family History  Problem Relation Age of Onset  . Heart attack Mother 54  . Hypertension Mother   . Heart disease Mother   . Obesity Mother   . Cancer Father        prostate  . Heart attack Sister 69  . Hypertension Other     ROS: Review of Systems  Constitutional: Negative for weight loss.  Gastrointestinal: Negative for nausea and vomiting.  Musculoskeletal:       Negative for muscle weakness    PHYSICAL EXAM: Blood pressure 129/79, pulse 91, temperature 98.8 F (37.1 C), temperature source Oral, height 5\' 9"  (1.753 m),  weight (!) 301 lb (136.5 kg), SpO2 100 %. Body mass index is 44.45 kg/m. Physical Exam  Constitutional: She is oriented to person, place, and time. She appears well-developed and well-nourished.  Cardiovascular: Normal rate.  Pulmonary/Chest: Effort normal.  Musculoskeletal: Normal range of motion.  Neurological: She is oriented to person, place, and time.  Skin: Skin is warm and dry.  Psychiatric: She has a normal mood and affect. Her behavior  is normal.  Vitals reviewed.   RECENT LABS AND TESTS: BMET    Component Value Date/Time   NA 137 09/12/2017 0927   K 3.6 09/12/2017 0927   CL 101 09/12/2017 0927   CO2 25 09/12/2017 0927   GLUCOSE 85 09/12/2017 0927   GLUCOSE 107 (H) 03/15/2017 1140   BUN 10 09/12/2017 0927   CREATININE 0.65 09/12/2017 0927   CREATININE 0.72 02/03/2014 1648   CALCIUM 9.0 09/12/2017 0927   GFRNONAA 105 09/12/2017 0927   GFRAA 121 09/12/2017 0927   Lab Results  Component Value Date   HGBA1C 5.8 (H) 09/12/2017   HGBA1C 6.2 (H) 05/23/2017   HGBA1C 6.6 (H) 03/15/2017   HGBA1C 6.3 11/06/2016   HGBA1C 6.2 07/18/2016   Lab Results  Component Value Date   INSULIN 12.9 09/12/2017   INSULIN 31.1 (H) 05/23/2017   CBC    Component Value Date/Time   WBC 7.0 05/23/2017 1025   WBC 6.7 03/15/2017 1140   RBC 4.46 05/23/2017 1025   RBC 4.49 03/15/2017 1140   HGB 12.1 05/23/2017 1025   HCT 36.6 05/23/2017 1025   PLT 273.0 03/15/2017 1140   MCV 82 05/23/2017 1025   MCH 27.1 05/23/2017 1025   MCH 28.2 02/03/2014 1648   MCHC 33.1 05/23/2017 1025   MCHC 34.0 03/15/2017 1140   RDW 14.9 05/23/2017 1025   LYMPHSABS 1.7 05/23/2017 1025   MONOABS 0.4 06/09/2014 0843   EOSABS 0.1 05/23/2017 1025   BASOSABS 0.0 05/23/2017 1025   Iron/TIBC/Ferritin/ %Sat No results found for: IRON, TIBC, FERRITIN, IRONPCTSAT Lipid Panel     Component Value Date/Time   CHOL 132 09/12/2017 0927   TRIG 93 09/12/2017 0927   HDL 32 (L) 09/12/2017 0927   CHOLHDL 4 03/15/2017  1140   VLDL 26.6 03/15/2017 1140   LDLCALC 81 09/12/2017 0927   Hepatic Function Panel     Component Value Date/Time   PROT 6.9 09/12/2017 0927   ALBUMIN 4.1 09/12/2017 0927   AST 8 09/12/2017 0927   ALT 9 09/12/2017 0927   ALKPHOS 78 09/12/2017 0927   BILITOT 0.2 09/12/2017 0927   BILIDIR 0.0 06/09/2014 0843   IBILI 0.1 (L) 02/03/2014 1648      Component Value Date/Time   TSH 1.710 05/23/2017 1025   TSH 1.26 03/15/2017 1140   TSH 1.68 11/06/2016 1438    ASSESSMENT AND PLAN: Vitamin D deficiency  Class 3 severe obesity with serious comorbidity and body mass index (BMI) of 40.0 to 44.9 in adult, unspecified obesity type (HCC)  PLAN:  Vitamin D Deficiency Aliviah was informed that low vitamin D levels contributes to fatigue and are associated with obesity, breast, and colon cancer. She agrees to continue to take prescription Vit D @50 ,000 IU every week and will follow up for routine testing of vitamin D, at least 2-3 times per year. She was informed of the risk of over-replacement of vitamin D and agrees to not increase her dose unless she discusses this with [name] first.  We spent > than 50% of the 15 minute visit on the counseling as documented in the note.  Obesity Netty is currently in the action stage of change. As such, her goal is to continue with weight loss efforts She has agreed to keep a food journal with 1500 calories and 90 grams of protein  daily Esthela has been instructed to work up to a goal of 150 minutes of combined cardio and strengthening exercise per week for weight loss and overall  health benefits. We discussed the following Behavioral Modification Strategies today: increasing lean protein intake, increase H2O intake and keep a strict food journal.  Gracelynn has agreed to follow up with our clinic in 2 weeks. She was informed of the importance of frequent follow up visits to maximize her success with intensive lifestyle modifications for her multiple  health conditions.    OBESITY BEHAVIORAL INTERVENTION VISIT  Today's visit was # 10 out of 22.  Starting weight: 325 lbs Starting date: 05/23/17 Today's weight : 301 lbs Today's date: 10/10/2017 Total lbs lost to date: 24 (Patients must lose 7 lbs in the first 6 months to continue with counseling)   ASK: We discussed the diagnosis of obesity with [name] today and Mario agreed to give [name] permission to discuss obesity behavioral modification therapy today.  ASSESS: Zahava has the diagnosis of obesity and her BMI today is 44.43 Chealsea is in the action stage of change   ADVISE: Kevin was educated on the multiple health risks of obesity as well as the benefit of weight loss to improve her health. She was advised of the need for long term treatment and the importance of lifestyle modifications.  AGREE: Multiple dietary modification options and treatment options were discussed and  Baya agreed to the above obesity treatment plan.   Corey Skains, am acting as transcriptionist for [employer], PA-C I, [facility], have reviewed this note and agree with its content

## 2017-10-24 ENCOUNTER — Ambulatory Visit (INDEPENDENT_AMBULATORY_CARE_PROVIDER_SITE_OTHER): Payer: BC Managed Care – PPO | Admitting: Physician Assistant

## 2017-10-24 VITALS — BP 127/71 | HR 82 | Temp 99.0°F | Ht 69.0 in | Wt 300.0 lb

## 2017-10-24 DIAGNOSIS — E559 Vitamin D deficiency, unspecified: Secondary | ICD-10-CM | POA: Diagnosis not present

## 2017-10-24 DIAGNOSIS — Z6841 Body Mass Index (BMI) 40.0 and over, adult: Secondary | ICD-10-CM

## 2017-10-24 MED FILL — POTASSIUM CL ER 20 MEQ TABL: 20 | 30 days supply | Qty: 120 | Fill #1

## 2017-10-24 MED FILL — PANTOPRAZOLE SOD DR 40 MG T: 40 | 90 days supply | Qty: 90 | Fill #2

## 2017-10-24 MED FILL — VIT D2 1.25 MG (50,000 UNIT: 1.25 MG | 28 days supply | Qty: 4 | Fill #0

## 2017-10-24 NOTE — Progress Notes (Signed)
Office: [phone]  /  Fax: [phone]   HPI:   Chief Complaint: OBESITY Brandi Lester is here to discuss her progress with her obesity treatment plan. She is on the  keep a food journal with 1500 calories and 90 grams of protein  and is following her eating plan approximately 85 % of the time. She states she is walking for 30 minutes 5 times per week. Brandi Lester continues to do well with weight loss. She has had an increase in emotional eating, but manages to make smarter food choices.  Her weight is 300 lb (136.1 kg) today and has had a weight loss of 1 pound over a period of 2 weeks since her last visit. She has lost 25 lbs since starting treatment with [name].  Vitamin D deficiency Brandi Lester has a diagnosis of vitamin D deficiency. She is currently taking vit D and denies nausea, vomiting or muscle weakness.   Ref. Range 09/12/2017 09:27  Vitamin D, 25-Hydroxy Latest Ref Range: 30.0 - 100.0 ng/mL 27.9 (L)   ALLERGIES: Allergies  Allergen Reactions  . Penicillins Itching and Swelling    Has patient had a PCN reaction causing immediate rash, facial/tongue/throat swelling, SOB or lightheadedness with hypotension: yes Has patient had a PCN reaction causing severe rash involving mucus membranes or skin necrosis: no Has patient had a PCN reaction that required hospitalization no Has patient had a PCN reaction occurring within the last 10 years: yes If all of the above answers are "NO", then may proceed with Cephalosporin use.   Brandi Lester [Liraglutide]     Vomiting    MEDICATIONS: Current Outpatient Medications on File Prior to Visit  Medication Sig Dispense Refill  . acetaminophen (TYLENOL 8 HOUR) 650 MG CR tablet Take 1 tablet (650 mg total) every 8 (eight) hours as needed by mouth for pain.    [name] amLODipine (NORVASC) 5 MG tablet Take 1 tablet (5 mg total) by mouth daily. 90 tablet 1  . aspirin EC 81 MG tablet Take 1 tablet (81 mg total) by mouth daily.    [name] azelastine (ASTELIN) 0.1 % nasal  spray Place 2 sprays into both nostrils 2 (two) times daily. Use in each nostril as directed 90 mL 1  . azithromycin (ZITHROMAX Z-PAK) 250 MG tablet 2 tabs a day the first day, then 1 tab a day x 4 days 6 tablet 0  . Cyanocobalamin (B-12) 1000 MCG CAPS Take by mouth daily.    [name] EPINEPHrine (EPIPEN 2-PAK) 0.3 mg/0.3 mL IJ SOAJ injection Use as directed for severe allergic reaction 2 Device 1  . fexofenadine (ALLEGRA ALLERGY) 180 MG tablet Take 1 tablet (180 mg total) by mouth daily. 301 tablet 1  . fluticasone (FLONASE) 50 MCG/ACT nasal spray Place 2 sprays into both nostrils daily. (Patient taking differently: Place 2 sprays into both nostrils daily as needed for allergies. ) 16 g 11  . glucose blood test strip OneTouch Ultra Test strips    . ibuprofen (ADVIL,MOTRIN) 600 MG tablet Take 1 tablet (600 mg total) by mouth every 6 (six) hours as needed. 90 tablet 0  . losartan-hydrochlorothiazide (HYZAAR) 100-25 MG tablet TAKE 1 TABLET BY MOUTH DAILY. 90 tablet 1  . metFORMIN (GLUCOPHAGE) 500 MG tablet TAKE 1 TABLET (500 MG TOTAL) BY MOUTH DAILY WITH BREAKFAST. 90 tablet 1  . montelukast (SINGULAIR) 10 MG tablet Take 1 tablet (10 mg total) by mouth at bedtime. 90 tablet 1  . Omega-3 Fatty Acids (FISH OIL PO) Take 1 capsule by mouth  daily.    Brandi Lester DELICA LANCETS 15V MISC OneTouch Delica Lancets 33 gauge    . pantoprazole (PROTONIX) 40 MG tablet Take 1 tablet (40 mg total) by mouth daily. 90 tablet 1  . potassium chloride SA (K-DUR,KLOR-CON) 20 MEQ tablet Take 1 tablet (20 mEq total) by mouth 3 (three) times daily. 120 tablet 3  . Probiotic Product (PROBIOTIC-10 PO) Take by mouth daily.    . TURMERIC PO Take 1 capsule by mouth daily.    . Vitamin D, Ergocalciferol, (DRISDOL) 50000 units CAPS capsule Take 1 capsule (50,000 Units total) by mouth every 7 (seven) days. 4 capsule 0   No current facility-administered medications on file prior to visit.     PAST MEDICAL HISTORY: Past Medical History:    Diagnosis Date  . ADD (attention deficit disorder)   . Allergy    allergic rhinitis  . Anemia 07/25/2013  . Back pain   . Costochondritis 02/20/2015  . Diabetes mellitus    Type II   previously 3 years ago - no longer on meds   . Dyslipidemia 07/25/2013  . Fungus infection 10/13   Left great toe  . GERD (gastroesophageal reflux disease)   . Hypertension   . IBS (irritable bowel syndrome)   . Ingrown nail 10/13   right foot next to the last toe  . Insomnia 04/20/2013  . Joint pain   . Lactose intolerance   . Leg edema   . Obesity   . Pedal edema 02/20/2015  . Prediabetes   . Preventative health care 09/25/2015  . PVC (premature ventricular contraction)     PAST SURGICAL HISTORY: Past Surgical History:  Procedure Laterality Date  . HYSTEROSCOPY N/A 10/04/2015   Procedure: HYSTEROSCOPY with Removal IUD;  Surgeon: Vanessa Kick, MD;  Location: [facility];  Service: Gynecology;  Laterality: N/A;  . lasik     eye surgery  . WISDOM TOOTH EXTRACTION      SOCIAL HISTORY: Social History   Tobacco Use  . Smoking status: Former Smoker    Packs/day: 1.00    Years: 15.00    Pack years: 15.00    Last attempt to quit: 09/03/2001    Years since quitting: 16.1  . Smokeless tobacco: Never Used  Substance Use Topics  . Alcohol use: Yes    Comment: rare  . Drug use: No    FAMILY HISTORY: Family History  Problem Relation Age of Onset  . Heart attack Mother 61  . Hypertension Mother   . Heart disease Mother   . Obesity Mother   . Cancer Father        prostate  . Heart attack Sister 32  . Hypertension Other     ROS: Review of Systems  Constitutional: Positive for weight loss.  Gastrointestinal: Negative for nausea and vomiting.  Musculoskeletal:       Negative for muscle weakness    PHYSICAL EXAM: Blood pressure 127/71, pulse 82, temperature 99 F (37.2 C), temperature source Oral, height 5\' 9"  (1.753 m), weight 300 lb (136.1 kg), SpO2 90 %. Body mass index is 44.3  kg/m. Physical Exam  Constitutional: She is oriented to person, place, and time. She appears well-developed and well-nourished.  HENT:  Head: Normocephalic.  Eyes: EOM are normal.  Neck: Normal range of motion.  Cardiovascular: Normal rate.  Pulmonary/Chest: Effort normal.  Musculoskeletal: Normal range of motion.  Neurological: She is alert and oriented to person, place, and time.  Skin: Skin is warm and dry.  Psychiatric:  She has a normal mood and affect. Her behavior is normal.  Vitals reviewed.   RECENT LABS AND TESTS: BMET    Component Value Date/Time   NA 137 09/12/2017 0927   K 3.6 09/12/2017 0927   CL 101 09/12/2017 0927   CO2 25 09/12/2017 0927   GLUCOSE 85 09/12/2017 0927   GLUCOSE 107 (H) 03/15/2017 1140   BUN 10 09/12/2017 0927   CREATININE 0.65 09/12/2017 0927   CREATININE 0.72 02/03/2014 1648   CALCIUM 9.0 09/12/2017 0927   GFRNONAA 105 09/12/2017 0927   GFRAA 121 09/12/2017 0927   Lab Results  Component Value Date   HGBA1C 5.8 (H) 09/12/2017   HGBA1C 6.2 (H) 05/23/2017   HGBA1C 6.6 (H) 03/15/2017   HGBA1C 6.3 11/06/2016   HGBA1C 6.2 07/18/2016   Lab Results  Component Value Date   INSULIN 12.9 09/12/2017   INSULIN 31.1 (H) 05/23/2017   CBC    Component Value Date/Time   WBC 7.0 05/23/2017 1025   WBC 6.7 03/15/2017 1140   RBC 4.46 05/23/2017 1025   RBC 4.49 03/15/2017 1140   HGB 12.1 05/23/2017 1025   HCT 36.6 05/23/2017 1025   PLT 273.0 03/15/2017 1140   MCV 82 05/23/2017 1025   MCH 27.1 05/23/2017 1025   MCH 28.2 02/03/2014 1648   MCHC 33.1 05/23/2017 1025   MCHC 34.0 03/15/2017 1140   RDW 14.9 05/23/2017 1025   LYMPHSABS 1.7 05/23/2017 1025   MONOABS 0.4 06/09/2014 0843   EOSABS 0.1 05/23/2017 1025   BASOSABS 0.0 05/23/2017 1025   Iron/TIBC/Ferritin/ %Sat No results found for: IRON, TIBC, FERRITIN, IRONPCTSAT Lipid Panel     Component Value Date/Time   CHOL 132 09/12/2017 0927   TRIG 93 09/12/2017 0927   HDL 32 (L) 09/12/2017  0927   CHOLHDL 4 03/15/2017 1140   VLDL 26.6 03/15/2017 1140   LDLCALC 81 09/12/2017 0927   Hepatic Function Panel     Component Value Date/Time   PROT 6.9 09/12/2017 0927   ALBUMIN 4.1 09/12/2017 0927   AST 8 09/12/2017 0927   ALT 9 09/12/2017 0927   ALKPHOS 78 09/12/2017 0927   BILITOT 0.2 09/12/2017 0927   BILIDIR 0.0 06/09/2014 0843   IBILI 0.1 (L) 02/03/2014 1648      Component Value Date/Time   TSH 1.710 05/23/2017 1025   TSH 1.26 03/15/2017 1140   TSH 1.68 11/06/2016 1438     Ref. Range 09/12/2017 09:27  Vitamin D, 25-Hydroxy Latest Ref Range: 30.0 - 100.0 ng/mL 27.9 (L)   ASSESSMENT AND PLAN: Vitamin D deficiency  Class 3 severe obesity with serious comorbidity and body mass index (BMI) of 40.0 to 44.9 in adult, unspecified obesity type (HCC)  PLAN: Vitamin D Deficiency Brandi Lester was informed that low vitamin D levels contributes to fatigue and are associated with obesity, breast, and colon cancer. She agrees to continue to take prescription Vit D @50 ,000 IU every week and will follow up for routine testing of vitamin D, at least 2-3 times per year. She was informed of the risk of over-replacement of vitamin D and agrees to not increase her dose unless she discusses this with [name] first.  We spent > than 50% of the 15 minute visit on the counseling as documented in the note.  Obesity Brandi Lester is currently in the action stage of change. As such, her goal is to continue with weight loss efforts She has agreed to keep a food journal with 1500 calories and 90 grams of protein  Brandi Lester has been instructed to work up to a goal of 150 minutes of combined cardio and strengthening exercise per week for weight loss and overall health benefits. We discussed the following Behavioral Modification Strategies today: increasing lean protein intake and emotional eating strategies   Brandi Lester has agreed to follow up with our clinic in 2 weeks. She was informed of the importance of  frequent follow up visits to maximize her success with intensive lifestyle modifications for her multiple health conditions.   Today's visit was # 11 out of 22.  Starting weight: 325 lbs Starting date: 05/23/17 Today's weight : 300 lbs Today's date: 10/24/2017 Total lbs lost to date: 25 (Patients must lose 7 lbs in the first 6 months to continue with counseling)   ASK: We discussed the diagnosis of obesity with Brandi Lester today and Brandi Lester agreed to give [name] permission to discuss obesity behavioral modification therapy today.  ASSESS: Brandi Lester has the diagnosis of obesity and her BMI today is 44.3 Brandi Lester is in the action stage of change   ADVISE: Brandi Lester was educated on the multiple health risks of obesity as well as the benefit of weight loss to improve her health. She was advised of the need for long term treatment and the importance of lifestyle modifications.  AGREE: Multiple dietary modification options and treatment options were discussed and  Brandi Lester agreed to the above obesity treatment plan.   Leary Roca, am acting as transcriptionist for [employer], PA-C I, [facility], have reviewed this note and agree with its content.

## 2017-11-04 ENCOUNTER — Encounter: Payer: Self-pay | Admitting: Family Medicine

## 2017-11-04 ENCOUNTER — Ambulatory Visit: Payer: BC Managed Care – PPO | Admitting: Family Medicine

## 2017-11-04 VITALS — BP 118/80 | HR 113 | Temp 99.0°F | Ht 70.0 in | Wt 300.4 lb

## 2017-11-04 DIAGNOSIS — J0100 Acute maxillary sinusitis, unspecified: Secondary | ICD-10-CM

## 2017-11-04 MED ORDER — DOXYCYCLINE HYCLATE 100 MG PO TABS
100.0000 mg | ORAL_TABLET | Freq: Two times a day (BID) | ORAL | 0 refills | Status: AC
Start: 2017-11-04 — End: 2017-11-11

## 2017-11-04 MED ORDER — METHYLPREDNISOLONE ACETATE 80 MG/ML IJ SUSP
80.0000 mg | Freq: Once | INTRAMUSCULAR | Status: AC
  Administered 2017-11-04: 80 mg via INTRAMUSCULAR

## 2017-11-04 MED FILL — DOXYCYCLINE HYCLATE 100 MG: 100 | 7 days supply | Qty: 14 | Fill #0

## 2017-11-04 NOTE — Patient Instructions (Addendum)
Since your sinusitis symptoms have been worsening for 4 days, we are calling in an antibiotic. Wait until tonight or tomorrow morning before taking. Take it with food.  For symptoms, consider using Vick's VapoRub on chest or under nose, air humidifier, Benadryl at night, and elevating the head of the bed. Tylenol and ibuprofen for aches and pains you may be experiencing.   Continue to push fluids, practice good hand hygiene, and cover your mouth if you cough.  If you start having fevers, shaking or shortness of breath, seek immediate care.  Let us know if you need anything.

## 2017-11-04 NOTE — Addendum Note (Signed)
Addended by: Jiles Prows on: 11/04/2017 11:22 AM   Modules accepted: Orders

## 2017-11-04 NOTE — Progress Notes (Signed)
Chief Complaint  Patient presents with  . Sinusitis  . Cough  . Ear Fullness    [name] here for URI complaints.  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 ADD (attention deficit disorder)   . Allergy    allergic rhinitis  . Anemia 07/25/2013  . Back pain   . Costochondritis 02/20/2015  . Diabetes mellitus    Type II   previously 3 years ago - no longer on meds   . Dyslipidemia 07/25/2013  . Fungus infection 10/13   Left great toe  . GERD (gastroesophageal reflux disease)   . Hypertension   . IBS (irritable bowel syndrome)   . Ingrown nail 10/13   right foot next to the last toe  . Insomnia 04/20/2013  . Joint pain   . Lactose intolerance   . Leg edema   . Obesity   . Pedal edema 02/20/2015  . Prediabetes   . Preventative health care 09/25/2015  . PVC (premature ventricular contraction)      BP 118/80 (BP Location: Left Arm, Patient Position: Sitting, Cuff Size: Large)   Pulse (!) 113   Temp 99 F (37.2 C) (Oral)   Ht 5\' 10"  (1.778 m)   Wt (!) 300 lb 6 oz (136.2 kg)   SpO2 96%   BMI 43.10 kg/m  General: Awake, alert, appears stated age HEENT: AT, [place], ears patent b/l and TM's neg, +TTP over max sinuses b/l, nares patent w/o discharge, pharynx pink and without exudates, MMM Neck: No masses or asymmetry Heart: RRR Lungs: CTAB, no accessory muscle use Psych: Age appropriate judgment and insight, normal mood and affect  Acute maxillary sinusitis, recurrence not specified - Plan: doxycycline (VIBRA-TABS) 100 MG tablet  Orders as above. Tx since she has been worsening  for past 4 days. I think this could be related to allergies so will give steroid shot today, will help with inflammation at very least.  Letter for work given. Continue to push fluids, practice good hand hygiene, cover mouth when coughing. F/u prn. If starting to experience fevers, shaking, or shortness of breath, seek immediate care. Pt voiced understanding and agreement to the plan.  [place], DO 11/04/17 11:06 AM

## 2017-11-04 NOTE — Progress Notes (Signed)
Pre visit review using our clinic review tool, if applicable. No additional management support is needed unless otherwise documented below in the visit note. 

## 2017-11-11 ENCOUNTER — Ambulatory Visit (INDEPENDENT_AMBULATORY_CARE_PROVIDER_SITE_OTHER): Payer: BC Managed Care – PPO | Admitting: Physician Assistant

## 2017-11-11 ENCOUNTER — Ambulatory Visit (INDEPENDENT_AMBULATORY_CARE_PROVIDER_SITE_OTHER): Payer: BC Managed Care – PPO | Admitting: *Deleted

## 2017-11-11 VITALS — BP 131/78 | HR 90 | Temp 98.5°F | Ht 70.0 in | Wt 293.0 lb

## 2017-11-11 DIAGNOSIS — E119 Type 2 diabetes mellitus without complications: Secondary | ICD-10-CM

## 2017-11-11 DIAGNOSIS — Z9189 Other specified personal risk factors, not elsewhere classified: Secondary | ICD-10-CM

## 2017-11-11 DIAGNOSIS — E559 Vitamin D deficiency, unspecified: Secondary | ICD-10-CM | POA: Diagnosis not present

## 2017-11-11 DIAGNOSIS — Z6841 Body Mass Index (BMI) 40.0 and over, adult: Secondary | ICD-10-CM | POA: Diagnosis not present

## 2017-11-11 DIAGNOSIS — J309 Allergic rhinitis, unspecified: Secondary | ICD-10-CM

## 2017-11-11 MED ORDER — VITAMIN D (ERGOCALCIFEROL) 1.25 MG (50000 UNIT) PO CAPS: 50000.0000 [IU] | ORAL_CAPSULE | ORAL | 0 refills | Status: DC

## 2017-11-11 NOTE — Progress Notes (Signed)
Office: [phone]  /  Fax: [phone]   HPI:   Chief Complaint: OBESITY Brandi Lester is here to discuss her progress with her obesity treatment plan. She is on the keep a food journal with 1500 calories and 90 grams of protein daily and is following her eating plan approximately 50 % of the time. She states she is walking 30 to 45 minutes 3 times per week. Brandi Lester has been sick with an upper respiratory infection and has been skipping some of her meals. She is now better and is motivated to get back on track. Her weight is 293 lb (132.9 kg) today and has had a weight loss of 7 pounds over a period of 2 weeks since her last visit. She has lost 32 lbs since starting treatment with [name].  Vitamin D deficiency Brandi Lester has a diagnosis of vitamin D deficiency. She is currently taking vit D and denies nausea, vomiting or muscle weakness.   Ref. Range 09/12/2017 09:27  Vitamin D, 25-Hydroxy Latest Ref Range: 30.0 - 100.0 ng/mL 27.9 (L)   At risk for osteopenia and osteoporosis Brandi Lester is at higher risk of osteopenia and osteoporosis due to vitamin D deficiency.   Diabetes II non insulin without complications Brandi Lester has a diagnosis of diabetes type II. Brandi Lester states she is not checking blood sugar at home and denies any hypoglycemic episodes. She is currently on metformin. She has been working on intensive lifestyle modifications including diet, exercise, and weight loss to help control her blood glucose levels.  ALLERGIES: Allergies  Allergen Reactions  . Penicillins Itching and Swelling    Has patient had a PCN reaction causing immediate rash, facial/tongue/throat swelling, SOB or lightheadedness with hypotension: yes Has patient had a PCN reaction causing severe rash involving mucus membranes or skin necrosis: no Has patient had a PCN reaction that required hospitalization no Has patient had a PCN reaction occurring within the last 10 years: yes If all of the above answers are "NO", then  may proceed with Cephalosporin use.   Brandi Lester [Liraglutide]     Vomiting    MEDICATIONS: Current Outpatient Medications on File Prior to Visit  Medication Sig Dispense Refill  . acetaminophen (TYLENOL 8 HOUR) 650 MG CR tablet Take 1 tablet (650 mg total) every 8 (eight) hours as needed by mouth for pain.    [name] amLODipine (NORVASC) 5 MG tablet Take 1 tablet (5 mg total) by mouth daily. 90 tablet 1  . aspirin EC 81 MG tablet Take 1 tablet (81 mg total) by mouth daily.    [name] azelastine (ASTELIN) 0.1 % nasal spray Place 2 sprays into both nostrils 2 (two) times daily. Use in each nostril as directed 90 mL 1  . Cyanocobalamin (B-12) 1000 MCG CAPS Take by mouth daily.    [name] doxycycline (VIBRA-TABS) 100 MG tablet Take 1 tablet (100 mg total) by mouth 2 (two) times daily for 7 days. 14 tablet 0  . EPINEPHrine (EPIPEN 2-PAK) 0.3 mg/0.3 mL IJ SOAJ injection Use as directed for severe allergic reaction 2 Device 1  . fexofenadine (ALLEGRA ALLERGY) 180 MG tablet Take 1 tablet (180 mg total) by mouth daily. 301 tablet 1  . fluticasone (FLONASE) 50 MCG/ACT nasal spray Place 2 sprays into both nostrils daily. (Patient taking differently: Place 2 sprays into both nostrils daily as needed for allergies. ) 16 g 11  . glucose blood test strip OneTouch Ultra Test strips    . ibuprofen (ADVIL,MOTRIN) 600 MG tablet Take 1 tablet (600 mg  total) by mouth every 6 (six) hours as needed. 90 tablet 0  . losartan-hydrochlorothiazide (HYZAAR) 100-25 MG tablet TAKE 1 TABLET BY MOUTH DAILY. 90 tablet 1  . metFORMIN (GLUCOPHAGE) 500 MG tablet TAKE 1 TABLET (500 MG TOTAL) BY MOUTH DAILY WITH BREAKFAST. 90 tablet 1  . montelukast (SINGULAIR) 10 MG tablet Take 1 tablet (10 mg total) by mouth at bedtime. 90 tablet 1  . Omega-3 Fatty Acids (FISH OIL PO) Take 1 capsule by mouth daily.    Brandi Lester DELICA LANCETS 41D MISC OneTouch Delica Lancets 33 gauge    . pantoprazole (PROTONIX) 40 MG tablet Take 1 tablet (40 mg total) by mouth  daily. 90 tablet 1  . potassium chloride SA (K-DUR,KLOR-CON) 20 MEQ tablet Take 1 tablet (20 mEq total) by mouth 3 (three) times daily. 120 tablet 3  . Probiotic Product (PROBIOTIC-10 PO) Take by mouth daily.    . TURMERIC PO Take 1 capsule by mouth daily.    . Vitamin D, Ergocalciferol, (DRISDOL) 50000 units CAPS capsule Take 1 capsule (50,000 Units total) by mouth every 7 (seven) days. 4 capsule 0   No current facility-administered medications on file prior to visit.     PAST MEDICAL HISTORY: Past Medical History:  Diagnosis Date  . ADD (attention deficit disorder)   . Allergy    allergic rhinitis  . Anemia 07/25/2013  . Back pain   . Costochondritis 02/20/2015  . Diabetes mellitus    Type II   previously 3 years ago - no longer on meds   . Dyslipidemia 07/25/2013  . Fungus infection 10/13   Left great toe  . GERD (gastroesophageal reflux disease)   . Hypertension   . IBS (irritable bowel syndrome)   . Ingrown nail 10/13   right foot next to the last toe  . Insomnia 04/20/2013  . Joint pain   . Lactose intolerance   . Leg edema   . Obesity   . Pedal edema 02/20/2015  . Prediabetes   . Preventative health care 09/25/2015  . PVC (premature ventricular contraction)     PAST SURGICAL HISTORY: Past Surgical History:  Procedure Laterality Date  . HYSTEROSCOPY N/A 10/04/2015   Procedure: HYSTEROSCOPY with Removal IUD;  Surgeon: Vanessa Kick, MD;  Location: [facility];  Service: Gynecology;  Laterality: N/A;  . lasik     eye surgery  . WISDOM TOOTH EXTRACTION      SOCIAL HISTORY: Social History   Tobacco Use  . Smoking status: Former Smoker    Packs/day: 1.00    Years: 15.00    Pack years: 15.00    Last attempt to quit: 09/03/2001    Years since quitting: 16.2  . Smokeless tobacco: Never Used  Substance Use Topics  . Alcohol use: Yes    Comment: rare  . Drug use: No    FAMILY HISTORY: Family History  Problem Relation Age of Onset  . Heart attack Mother 58  .  Hypertension Mother   . Heart disease Mother   . Obesity Mother   . Cancer Father        prostate  . Heart attack Sister 93  . Hypertension Other     ROS: Review of Systems  Constitutional: Positive for weight loss.  Gastrointestinal: Negative for nausea and vomiting.  Musculoskeletal:       Negative for muscle weakness  Endo/Heme/Allergies:       Negative for hypoglycemia    PHYSICAL EXAM: Blood pressure 131/78, pulse 90, temperature 98.5 F (  36.9 C), temperature source Oral, height 5\' 10"  (1.778 m), weight 293 lb (132.9 kg), SpO2 100 %. Body mass index is 42.04 kg/m. Physical Exam  Constitutional: She is oriented to person, place, and time. She appears well-developed and well-nourished.  Cardiovascular: Normal rate.  Pulmonary/Chest: Effort normal.  Musculoskeletal: Normal range of motion.  Neurological: She is oriented to person, place, and time.  Skin: Skin is warm and dry.  Psychiatric: She has a normal mood and affect. Her behavior is normal.  Vitals reviewed.   RECENT LABS AND TESTS: BMET    Component Value Date/Time   NA 137 09/12/2017 0927   K 3.6 09/12/2017 0927   CL 101 09/12/2017 0927   CO2 25 09/12/2017 0927   GLUCOSE 85 09/12/2017 0927   GLUCOSE 107 (H) 03/15/2017 1140   BUN 10 09/12/2017 0927   CREATININE 0.65 09/12/2017 0927   CREATININE 0.72 02/03/2014 1648   CALCIUM 9.0 09/12/2017 0927   GFRNONAA 105 09/12/2017 0927   GFRAA 121 09/12/2017 0927   Lab Results  Component Value Date   HGBA1C 5.8 (H) 09/12/2017   HGBA1C 6.2 (H) 05/23/2017   HGBA1C 6.6 (H) 03/15/2017   HGBA1C 6.3 11/06/2016   HGBA1C 6.2 07/18/2016   Lab Results  Component Value Date   INSULIN 12.9 09/12/2017   INSULIN 31.1 (H) 05/23/2017   CBC    Component Value Date/Time   WBC 7.0 05/23/2017 1025   WBC 6.7 03/15/2017 1140   RBC 4.46 05/23/2017 1025   RBC 4.49 03/15/2017 1140   HGB 12.1 05/23/2017 1025   HCT 36.6 05/23/2017 1025   PLT 273.0 03/15/2017 1140   MCV  82 05/23/2017 1025   MCH 27.1 05/23/2017 1025   MCH 28.2 02/03/2014 1648   MCHC 33.1 05/23/2017 1025   MCHC 34.0 03/15/2017 1140   RDW 14.9 05/23/2017 1025   LYMPHSABS 1.7 05/23/2017 1025   MONOABS 0.4 06/09/2014 0843   EOSABS 0.1 05/23/2017 1025   BASOSABS 0.0 05/23/2017 1025   Iron/TIBC/Ferritin/ %Sat No results found for: IRON, TIBC, FERRITIN, IRONPCTSAT Lipid Panel     Component Value Date/Time   CHOL 132 09/12/2017 0927   TRIG 93 09/12/2017 0927   HDL 32 (L) 09/12/2017 0927   CHOLHDL 4 03/15/2017 1140   VLDL 26.6 03/15/2017 1140   LDLCALC 81 09/12/2017 0927   Hepatic Function Panel     Component Value Date/Time   PROT 6.9 09/12/2017 0927   ALBUMIN 4.1 09/12/2017 0927   AST 8 09/12/2017 0927   ALT 9 09/12/2017 0927   ALKPHOS 78 09/12/2017 0927   BILITOT 0.2 09/12/2017 0927   BILIDIR 0.0 06/09/2014 0843   IBILI 0.1 (L) 02/03/2014 1648      Component Value Date/Time   TSH 1.710 05/23/2017 1025   TSH 1.26 03/15/2017 1140   TSH 1.68 11/06/2016 1438    Ref. Range 09/12/2017 09:27  Vitamin D, 25-Hydroxy Latest Ref Range: 30.0 - 100.0 ng/mL 27.9 (L)   ASSESSMENT AND PLAN: Vitamin D deficiency - Plan: Vitamin D, Ergocalciferol, (DRISDOL) 50000 units CAPS capsule  Type 2 diabetes mellitus without complication, without long-term current use of insulin (HCC)  At risk for osteoporosis  Class 3 severe obesity with serious comorbidity and body mass index (BMI) of 40.0 to 44.9 in adult, unspecified obesity type ([place])  PLAN:  Vitamin D Deficiency Tanisha was informed that low vitamin D levels contributes to fatigue and are associated with obesity, breast, and colon cancer. She agrees to continue to take prescription Vit  D @50 ,000 IU every week #4 with no refills and will follow up for routine testing of vitamin D, at least 2-3 times per year. She was informed of the risk of over-replacement of vitamin D and agrees to not increase her dose unless she discusses this with [name]  first. Korayma agrees to follow up with our clinic in 2 weeks.  At risk for osteopenia and osteoporosis Jora is at risk for osteopenia and osteoporosis due to her vitamin D deficiency. She was encouraged to take her vitamin D and follow her higher calcium diet and increase strengthening exercise to help strengthen her bones and decrease her risk of osteopenia and osteoporosis.  Diabetes II non insulin without complications Ladana has been given extensive diabetes education by myself today including ideal fasting and post-prandial blood glucose readings, individual ideal Hgb A1c goals and hypoglycemia prevention. We discussed the importance of good blood sugar control to decrease the likelihood of diabetic complications such as nephropathy, neuropathy, limb loss, blindness, coronary artery disease, and death. We discussed the importance of intensive lifestyle modification including diet, exercise and weight loss as the first line treatment for diabetes. Corinda agrees to continue her diabetes medications and will follow up at the agreed upon time.  Obesity Shekela is currently in the action stage of change. As such, her goal is to continue with weight loss efforts She has agreed to keep a food journal with 1500 calories and 90 grams of protein daily Camarie has been instructed to work up to a goal of 150 minutes of combined cardio and strengthening exercise per week for weight loss and overall health benefits. We discussed the following Behavioral Modification Strategies today: increasing lean protein intake and keep a strict food journal  Bless has agreed to follow up with our clinic in 2 weeks. She was informed of the importance of frequent follow up visits to maximize her success with intensive lifestyle modifications for her multiple health conditions.    OBESITY BEHAVIORAL INTERVENTION VISIT  Today's visit was # 12 out of 22.  Starting weight: 325 lbs Starting date: 05/23/17 Today's  weight : 293 lbs  Today's date: 11/11/2017 Total lbs lost to date: 87 (Patients must lose 7 lbs in the first 6 months to continue with counseling)   ASK: We discussed the diagnosis of obesity with [name] today and Kiele agreed to give [name] permission to discuss obesity behavioral modification therapy today.  ASSESS: Nyari has the diagnosis of obesity and her BMI today is 42.04 Shaena is in the action stage of change   ADVISE: Anthonella was educated on the multiple health risks of obesity as well as the benefit of weight loss to improve her health. She was advised of the need for long term treatment and the importance of lifestyle modifications.  AGREE: Multiple dietary modification options and treatment options were discussed and  Tove agreed to the above obesity treatment plan.   Corey Skains, am acting as transcriptionist for [employer], PA-C I, Lacy Duverney [facility], have reviewed this note and agree with its content

## 2017-11-20 MED FILL — VIT D2 1.25 MG (50,000 UNIT: 1.25 MG | 28 days supply | Qty: 4 | Fill #0

## 2017-11-25 ENCOUNTER — Ambulatory Visit (INDEPENDENT_AMBULATORY_CARE_PROVIDER_SITE_OTHER): Payer: BC Managed Care – PPO | Admitting: Physician Assistant

## 2017-11-25 VITALS — BP 130/79 | HR 92 | Temp 98.5°F | Ht 70.0 in | Wt 297.0 lb

## 2017-11-25 DIAGNOSIS — E559 Vitamin D deficiency, unspecified: Secondary | ICD-10-CM | POA: Diagnosis not present

## 2017-11-25 DIAGNOSIS — Z6841 Body Mass Index (BMI) 40.0 and over, adult: Secondary | ICD-10-CM

## 2017-11-25 NOTE — Progress Notes (Signed)
Office: [phone]  /  Fax: [phone]   HPI:   Chief Complaint: OBESITY Xylina is here to discuss her progress with her obesity treatment plan. She is on the keep a food journal with 1500 calories and 90 grams of protein daily and is following her eating plan approximately 80 % of the time. She states she is walking for 30 minutes 4 times per week. Rubye has not been keeping up with her protein especially at dinner. She would like more snack oprions.  Her weight is 297 lb (134.7 kg) today and has gained 4 pounds since her last visit. She has lost 28 lbs since starting treatment with [name].  Vitamin D Deficiency Kynzee has a diagnosis of vitamin D deficiency. She is currently taking prescription Vit D and denies nausea, vomiting or muscle weakness.  ALLERGIES: Allergies  Allergen Reactions  . Penicillins Itching and Swelling    Has patient had a PCN reaction causing immediate rash, facial/tongue/throat swelling, SOB or lightheadedness with hypotension: yes Has patient had a PCN reaction causing severe rash involving mucus membranes or skin necrosis: no Has patient had a PCN reaction that required hospitalization no Has patient had a PCN reaction occurring within the last 10 years: yes If all of the above answers are "NO", then may proceed with Cephalosporin use.   [name] [Liraglutide]     Vomiting    MEDICATIONS: Current Outpatient Medications on File Prior to Visit  Medication Sig Dispense Refill  . acetaminophen (TYLENOL 8 HOUR) 650 MG CR tablet Take 1 tablet (650 mg total) every 8 (eight) hours as needed by mouth for pain.    [name] amLODipine (NORVASC) 5 MG tablet Take 1 tablet (5 mg total) by mouth daily. 90 tablet 1  . aspirin EC 81 MG tablet Take 1 tablet (81 mg total) by mouth daily.    [name] azelastine (ASTELIN) 0.1 % nasal spray Place 2 sprays into both nostrils 2 (two) times daily. Use in each nostril as directed 90 mL 1  . Cyanocobalamin (B-12) 1000 MCG CAPS Take by  mouth daily.    [name] EPINEPHrine (EPIPEN 2-PAK) 0.3 mg/0.3 mL IJ SOAJ injection Use as directed for severe allergic reaction 2 Device 1  . fexofenadine (ALLEGRA ALLERGY) 180 MG tablet Take 1 tablet (180 mg total) by mouth daily. 301 tablet 1  . fluticasone (FLONASE) 50 MCG/ACT nasal spray Place 2 sprays into both nostrils daily. (Patient taking differently: Place 2 sprays into both nostrils daily as needed for allergies. ) 16 g 11  . glucose blood test strip OneTouch Ultra Test strips    . ibuprofen (ADVIL,MOTRIN) 600 MG tablet Take 1 tablet (600 mg total) by mouth every 6 (six) hours as needed. 90 tablet 0  . losartan-hydrochlorothiazide (HYZAAR) 100-25 MG tablet TAKE 1 TABLET BY MOUTH DAILY. 90 tablet 1  . metFORMIN (GLUCOPHAGE) 500 MG tablet TAKE 1 TABLET (500 MG TOTAL) BY MOUTH DAILY WITH BREAKFAST. 90 tablet 1  . montelukast (SINGULAIR) 10 MG tablet Take 1 tablet (10 mg total) by mouth at bedtime. 90 tablet 1  . Omega-3 Fatty Acids (FISH OIL PO) Take 1 capsule by mouth daily.    [name] DELICA LANCETS 29F MISC OneTouch Delica Lancets 33 gauge    . pantoprazole (PROTONIX) 40 MG tablet Take 1 tablet (40 mg total) by mouth daily. 90 tablet 1  . potassium chloride SA (K-DUR,KLOR-CON) 20 MEQ tablet Take 1 tablet (20 mEq total) by mouth 3 (three) times daily. 120 tablet 3  .  Probiotic Product (PROBIOTIC-10 PO) Take by mouth daily.    . TURMERIC PO Take 1 capsule by mouth daily.    . Vitamin D, Ergocalciferol, (DRISDOL) 50000 units CAPS capsule Take 1 capsule (50,000 Units total) by mouth every 7 (seven) days. 4 capsule 0   No current facility-administered medications on file prior to visit.     PAST MEDICAL HISTORY: Past Medical History:  Diagnosis Date  . ADD (attention deficit disorder)   . Allergy    allergic rhinitis  . Anemia 07/25/2013  . Back pain   . Costochondritis 02/20/2015  . Diabetes mellitus    Type II   previously 3 years ago - no longer on meds   . Dyslipidemia 07/25/2013   . Fungus infection 10/13   Left great toe  . GERD (gastroesophageal reflux disease)   . Hypertension   . IBS (irritable bowel syndrome)   . Ingrown nail 10/13   right foot next to the last toe  . Insomnia 04/20/2013  . Joint pain   . Lactose intolerance   . Leg edema   . Obesity   . Pedal edema 02/20/2015  . Prediabetes   . Preventative health care 09/25/2015  . PVC (premature ventricular contraction)     PAST SURGICAL HISTORY: Past Surgical History:  Procedure Laterality Date  . HYSTEROSCOPY N/A 10/04/2015   Procedure: HYSTEROSCOPY with Removal IUD;  Surgeon: Vanessa Kick, MD;  Location: [facility];  Service: Gynecology;  Laterality: N/A;  . lasik     eye surgery  . WISDOM TOOTH EXTRACTION      SOCIAL HISTORY: Social History   Tobacco Use  . Smoking status: Former Smoker    Packs/day: 1.00    Years: 15.00    Pack years: 15.00    Last attempt to quit: 09/03/2001    Years since quitting: 16.2  . Smokeless tobacco: Never Used  Substance Use Topics  . Alcohol use: Yes    Comment: rare  . Drug use: No    FAMILY HISTORY: Family History  Problem Relation Age of Onset  . Heart attack Mother 64  . Hypertension Mother   . Heart disease Mother   . Obesity Mother   . Cancer Father        prostate  . Heart attack Sister 37  . Hypertension Other     ROS: Review of Systems  Constitutional: Negative for weight loss.  Gastrointestinal: Negative for nausea and vomiting.  Musculoskeletal:       Negative muscle weakness    PHYSICAL EXAM: Blood pressure 130/79, pulse 92, temperature 98.5 F (36.9 C), temperature source Oral, height 5\' 10"  (1.778 m), weight 297 lb (134.7 kg), SpO2 97 %. Body mass index is 42.62 kg/m. Physical Exam  Constitutional: She is oriented to person, place, and time. She appears well-developed and well-nourished.  Cardiovascular: Normal rate.  Pulmonary/Chest: Effort normal.  Musculoskeletal: Normal range of motion.  Neurological: She is oriented  to person, place, and time.  Skin: Skin is warm and dry.  Psychiatric: She has a normal mood and affect. Her behavior is normal.  Vitals reviewed.   RECENT LABS AND TESTS: BMET    Component Value Date/Time   NA 137 09/12/2017 0927   K 3.6 09/12/2017 0927   CL 101 09/12/2017 0927   CO2 25 09/12/2017 0927   GLUCOSE 85 09/12/2017 0927   GLUCOSE 107 (H) 03/15/2017 1140   BUN 10 09/12/2017 0927   CREATININE 0.65 09/12/2017 0927   CREATININE 0.72 02/03/2014 1648  CALCIUM 9.0 09/12/2017 0927   GFRNONAA 105 09/12/2017 0927   GFRAA 121 09/12/2017 0927   Lab Results  Component Value Date   HGBA1C 5.8 (H) 09/12/2017   HGBA1C 6.2 (H) 05/23/2017   HGBA1C 6.6 (H) 03/15/2017   HGBA1C 6.3 11/06/2016   HGBA1C 6.2 07/18/2016   Lab Results  Component Value Date   INSULIN 12.9 09/12/2017   INSULIN 31.1 (H) 05/23/2017   CBC    Component Value Date/Time   WBC 7.0 05/23/2017 1025   WBC 6.7 03/15/2017 1140   RBC 4.46 05/23/2017 1025   RBC 4.49 03/15/2017 1140   HGB 12.1 05/23/2017 1025   HCT 36.6 05/23/2017 1025   PLT 273.0 03/15/2017 1140   MCV 82 05/23/2017 1025   MCH 27.1 05/23/2017 1025   MCH 28.2 02/03/2014 1648   MCHC 33.1 05/23/2017 1025   MCHC 34.0 03/15/2017 1140   RDW 14.9 05/23/2017 1025   LYMPHSABS 1.7 05/23/2017 1025   MONOABS 0.4 06/09/2014 0843   EOSABS 0.1 05/23/2017 1025   BASOSABS 0.0 05/23/2017 1025   Iron/TIBC/Ferritin/ %Sat No results found for: IRON, TIBC, FERRITIN, IRONPCTSAT Lipid Panel     Component Value Date/Time   CHOL 132 09/12/2017 0927   TRIG 93 09/12/2017 0927   HDL 32 (L) 09/12/2017 0927   CHOLHDL 4 03/15/2017 1140   VLDL 26.6 03/15/2017 1140   LDLCALC 81 09/12/2017 0927   Hepatic Function Panel     Component Value Date/Time   PROT 6.9 09/12/2017 0927   ALBUMIN 4.1 09/12/2017 0927   AST 8 09/12/2017 0927   ALT 9 09/12/2017 0927   ALKPHOS 78 09/12/2017 0927   BILITOT 0.2 09/12/2017 0927   BILIDIR 0.0 06/09/2014 0843   IBILI 0.1  (L) 02/03/2014 1648      Component Value Date/Time   TSH 1.710 05/23/2017 1025   TSH 1.26 03/15/2017 1140   TSH 1.68 11/06/2016 1438  Results for STPEHANIE, MONTROY (MRN [id number]) as of 11/25/2017 16:50  Ref. Range 09/12/2017 09:27  Vitamin D, 25-Hydroxy Latest Ref Range: 30.0 - 100.0 ng/mL 27.9 (L)    ASSESSMENT AND PLAN: Vitamin D deficiency  Class 3 severe obesity with serious comorbidity and body mass index (BMI) of 40.0 to 44.9 in adult, unspecified obesity type (HCC)  PLAN:  Vitamin D Deficiency Tajai was informed that low vitamin D levels contributes to fatigue and are associated with obesity, breast, and colon cancer. Kai agrees to continue taking prescription Vit D @50 ,000 IU every week #4 and will follow up for routine testing of vitamin D, at least 2-3 times per year. She was informed of the risk of over-replacement of vitamin D and agrees to not increase her dose unless she discusses this with [name] first. Rhoda agrees to follow up with our clinic in 2 weeks.  We spent > than 50% of the 15 minute visit on the counseling as documented in the note.  Obesity Janele is currently in the action stage of change. As such, her goal is to continue with weight loss efforts She has agreed to follow the Category 3 plan Margerite has been instructed to work up to a goal of 150 minutes of combined cardio and strengthening exercise per week for weight loss and overall health benefits. We discussed the following Behavioral Modification Strategies today: increasing lean protein intake and work on meal planning and easy cooking plans   Odetta has agreed to follow up with our clinic in 2 weeks. She was informed of the importance  of frequent follow up visits to maximize her success with intensive lifestyle modifications for her multiple health conditions.   OBESITY BEHAVIORAL INTERVENTION VISIT  Today's visit was # 13 out of 22.  Starting weight: 325 lbs Starting date:  05/23/17 Today's weight : 297 lbs Today's date: 11/25/2017 Total lbs lost to date: 63 (Patients must lose 7 lbs in the first 6 months to continue with counseling)   ASK: We discussed the diagnosis of obesity with [name] today and Kauri agreed to give [name] permission to discuss obesity behavioral modification therapy today.  ASSESS: Sera has the diagnosis of obesity and her BMI today is 42.61 Hillary is in the action stage of change   ADVISE: Chan was educated on the multiple health risks of obesity as well as the benefit of weight loss to improve her health. She was advised of the need for long term treatment and the importance of lifestyle modifications.  AGREE: Multiple dietary modification options and treatment options were discussed and  Jobeth agreed to the above obesity treatment plan.   Wilhemena Durie, am acting as transcriptionist for Lacy Duverney, PA-C I, Lacy Duverney [facility], have reviewed this note and agree with its content

## 2017-12-05 ENCOUNTER — Other Ambulatory Visit (INDEPENDENT_AMBULATORY_CARE_PROVIDER_SITE_OTHER): Payer: Self-pay | Admitting: Physician Assistant

## 2017-12-05 DIAGNOSIS — E559 Vitamin D deficiency, unspecified: Secondary | ICD-10-CM

## 2017-12-05 MED FILL — LOSARTAN-HCTZ 100-25 MG TAB: 100-25 | 90 days supply | Qty: 90 | Fill #1

## 2017-12-05 MED FILL — metFORMIN HCL 500 MG TABS: 500 | 90 days supply | Qty: 90 | Fill #1

## 2017-12-05 MED FILL — POTASSIUM CL ER 20 MEQ TAB: 20 | 30 days supply | Qty: 120 | Fill #2

## 2017-12-11 ENCOUNTER — Ambulatory Visit (INDEPENDENT_AMBULATORY_CARE_PROVIDER_SITE_OTHER): Payer: BC Managed Care – PPO | Admitting: Physician Assistant

## 2017-12-11 VITALS — BP 135/70 | HR 97 | Temp 98.6°F | Ht 70.0 in | Wt 300.0 lb

## 2017-12-11 DIAGNOSIS — Z6841 Body Mass Index (BMI) 40.0 and over, adult: Secondary | ICD-10-CM

## 2017-12-11 DIAGNOSIS — Z9189 Other specified personal risk factors, not elsewhere classified: Secondary | ICD-10-CM | POA: Diagnosis not present

## 2017-12-11 DIAGNOSIS — I10 Essential (primary) hypertension: Secondary | ICD-10-CM | POA: Diagnosis not present

## 2017-12-11 DIAGNOSIS — E559 Vitamin D deficiency, unspecified: Secondary | ICD-10-CM | POA: Diagnosis not present

## 2017-12-11 MED ORDER — VITAMIN D (ERGOCALCIFEROL) 1.25 MG (50000 UNIT) PO CAPS: 50000.0000 [IU] | ORAL_CAPSULE | ORAL | 0 refills | Status: DC

## 2017-12-11 MED FILL — VIT D2 1.25 MG (50,000 UNIT: 1.25 MG | 28 days supply | Qty: 4 | Fill #0

## 2017-12-11 NOTE — Progress Notes (Signed)
Office: [phone]  /  Fax: [phone]   HPI:   Chief Complaint: OBESITY Brandi Lester is here to discuss her progress with her obesity treatment plan. She is on the Category 3 plan and is following her eating plan approximately 55 % of the time. She states she is walking for 30 minutes 4 times per week. Brandi Lester had an increase in emotional eating and has not been eating the recommended protein on the plan. Her weight is 300 lb (136.1 kg) today and has had a weight gain of 3 pounds over a period of 2 weeks since her last visit. She has lost 25 lbs since starting treatment with [name].  Vitamin D deficiency Brandi Lester has a diagnosis of vitamin D deficiency. She is currently taking vit D and denies nausea, vomiting or muscle weakness.  At risk for osteopenia and osteoporosis Brandi Lester is at higher risk of osteopenia and osteoporosis due to vitamin D deficiency.   Hypertension Brandi Lester is a 52 y.o. female with hypertension and is currently on amlodipine and losartan-HCTZ. Brandi Lester denies chest pain or shortness of breath on exertion. She is working weight loss to help control her blood pressure with the goal of decreasing her risk of heart attack and stroke. Brandi Lester blood pressure is stable.  ALLERGIES: Allergies  Allergen Reactions  . Penicillins Itching and Swelling    Has patient had a PCN reaction causing immediate rash, facial/tongue/throat swelling, SOB or lightheadedness with hypotension: yes Has patient had a PCN reaction causing severe rash involving mucus membranes or skin necrosis: no Has patient had a PCN reaction that required hospitalization no Has patient had a PCN reaction occurring within the last 10 years: yes If all of the above answers are "NO", then may proceed with Cephalosporin use.   Brandi Lester [Liraglutide]     Vomiting    MEDICATIONS: Current Outpatient Medications on File Prior to Visit  Medication Sig Dispense Refill  . acetaminophen (TYLENOL 8  HOUR) 650 MG CR tablet Take 1 tablet (650 mg total) every 8 (eight) hours as needed by mouth for pain.    [name] amLODipine (NORVASC) 5 MG tablet Take 1 tablet (5 mg total) by mouth daily. 90 tablet 1  . aspirin EC 81 MG tablet Take 1 tablet (81 mg total) by mouth daily.    [name] azelastine (ASTELIN) 0.1 % nasal spray Place 2 sprays into both nostrils 2 (two) times daily. Use in each nostril as directed 90 mL 1  . Cyanocobalamin (B-12) 1000 MCG CAPS Take by mouth daily.    [name] EPINEPHrine (EPIPEN 2-PAK) 0.3 mg/0.3 mL IJ SOAJ injection Use as directed for severe allergic reaction 2 Device 1  . fexofenadine (ALLEGRA ALLERGY) 180 MG tablet Take 1 tablet (180 mg total) by mouth daily. 301 tablet 1  . fluticasone (FLONASE) 50 MCG/ACT nasal spray Place 2 sprays into both nostrils daily. (Patient taking differently: Place 2 sprays into both nostrils daily as needed for allergies. ) 16 g 11  . glucose blood test strip OneTouch Ultra Test strips    . ibuprofen (ADVIL,MOTRIN) 600 MG tablet Take 1 tablet (600 mg total) by mouth every 6 (six) hours as needed. 90 tablet 0  . losartan-hydrochlorothiazide (HYZAAR) 100-25 MG tablet TAKE 1 TABLET BY MOUTH DAILY. 90 tablet 1  . metFORMIN (GLUCOPHAGE) 500 MG tablet TAKE 1 TABLET (500 MG TOTAL) BY MOUTH DAILY WITH BREAKFAST. 90 tablet 1  . montelukast (SINGULAIR) 10 MG tablet Take 1 tablet (10 mg total) by mouth  at bedtime. 90 tablet 1  . Omega-3 Fatty Acids (FISH OIL PO) Take 1 capsule by mouth daily.    Brandi Lester 77O MISC OneTouch Delica Lester 33 gauge    . pantoprazole (PROTONIX) 40 MG tablet Take 1 tablet (40 mg total) by mouth daily. 90 tablet 1  . potassium chloride SA (K-DUR,KLOR-CON) 20 MEQ tablet Take 1 tablet (20 mEq total) by mouth 3 (three) times daily. 120 tablet 3  . Probiotic Product (PROBIOTIC-10 PO) Take by mouth daily.    . TURMERIC PO Take 1 capsule by mouth daily.     No current facility-administered medications on file prior to visit.      PAST MEDICAL HISTORY: Past Medical History:  Diagnosis Date  . ADD (attention deficit disorder)   . Allergy    allergic rhinitis  . Anemia 07/25/2013  . Back pain   . Costochondritis 02/20/2015  . Diabetes mellitus    Type II   previously 3 years ago - no longer on meds   . Dyslipidemia 07/25/2013  . Fungus infection 10/13   Left great toe  . GERD (gastroesophageal reflux disease)   . Hypertension   . IBS (irritable bowel syndrome)   . Ingrown nail 10/13   right foot next to the last toe  . Insomnia 04/20/2013  . Joint pain   . Lactose intolerance   . Leg edema   . Obesity   . Pedal edema 02/20/2015  . Prediabetes   . Preventative health care 09/25/2015  . PVC (premature ventricular contraction)     PAST SURGICAL HISTORY: Past Surgical History:  Procedure Laterality Date  . HYSTEROSCOPY N/A 10/04/2015   Procedure: HYSTEROSCOPY with Removal IUD;  Surgeon: Vanessa Kick, MD;  Location: [facility];  Service: Gynecology;  Laterality: N/A;  . lasik     eye surgery  . WISDOM TOOTH EXTRACTION      SOCIAL HISTORY: Social History   Tobacco Use  . Smoking status: Former Smoker    Packs/day: 1.00    Years: 15.00    Pack years: 15.00    Last attempt to quit: 09/03/2001    Years since quitting: 16.2  . Smokeless tobacco: Never Used  Substance Use Topics  . Alcohol use: Yes    Comment: rare  . Drug use: No    FAMILY HISTORY: Family History  Problem Relation Age of Onset  . Heart attack Mother 89  . Hypertension Mother   . Heart disease Mother   . Obesity Mother   . Cancer Father        prostate  . Heart attack Sister 55  . Hypertension Other     ROS: Review of Systems  Constitutional: Negative for weight loss.  Respiratory: Negative for shortness of breath (on exertion).   Cardiovascular: Negative for chest pain.  Gastrointestinal: Negative for nausea and vomiting.  Musculoskeletal:       Negative for muscle weakness    PHYSICAL EXAM: Blood pressure 135/70,  pulse 97, temperature 98.6 F (37 C), temperature source Oral, height 5\' 10"  (1.778 m), weight 300 lb (136.1 kg), SpO2 93 %. Body mass index is 43.05 kg/m. Physical Exam  Constitutional: She is oriented to person, place, and time. She appears well-developed and well-nourished.  Cardiovascular: Normal rate.  Pulmonary/Chest: Effort normal.  Musculoskeletal: Normal range of motion.  Neurological: She is oriented to person, place, and time.  Skin: Skin is warm and dry.  Psychiatric: She has a normal mood and affect. Her behavior is  normal.  Vitals reviewed.   RECENT LABS AND TESTS: BMET    Component Value Date/Time   NA 137 09/12/2017 0927   K 3.6 09/12/2017 0927   CL 101 09/12/2017 0927   CO2 25 09/12/2017 0927   GLUCOSE 85 09/12/2017 0927   GLUCOSE 107 (H) 03/15/2017 1140   BUN 10 09/12/2017 0927   CREATININE 0.65 09/12/2017 0927   CREATININE 0.72 02/03/2014 1648   CALCIUM 9.0 09/12/2017 0927   GFRNONAA 105 09/12/2017 0927   GFRAA 121 09/12/2017 0927   Lab Results  Component Value Date   HGBA1C 5.8 (H) 09/12/2017   HGBA1C 6.2 (H) 05/23/2017   HGBA1C 6.6 (H) 03/15/2017   HGBA1C 6.3 11/06/2016   HGBA1C 6.2 07/18/2016   Lab Results  Component Value Date   INSULIN 12.9 09/12/2017   INSULIN 31.1 (H) 05/23/2017   CBC    Component Value Date/Time   WBC 7.0 05/23/2017 1025   WBC 6.7 03/15/2017 1140   RBC 4.46 05/23/2017 1025   RBC 4.49 03/15/2017 1140   HGB 12.1 05/23/2017 1025   HCT 36.6 05/23/2017 1025   PLT 273.0 03/15/2017 1140   MCV 82 05/23/2017 1025   MCH 27.1 05/23/2017 1025   MCH 28.2 02/03/2014 1648   MCHC 33.1 05/23/2017 1025   MCHC 34.0 03/15/2017 1140   RDW 14.9 05/23/2017 1025   LYMPHSABS 1.7 05/23/2017 1025   MONOABS 0.4 06/09/2014 0843   EOSABS 0.1 05/23/2017 1025   BASOSABS 0.0 05/23/2017 1025   Iron/TIBC/Ferritin/ %Sat No results found for: IRON, TIBC, FERRITIN, IRONPCTSAT Lipid Panel     Component Value Date/Time   CHOL 132 09/12/2017  0927   TRIG 93 09/12/2017 0927   HDL 32 (L) 09/12/2017 0927   CHOLHDL 4 03/15/2017 1140   VLDL 26.6 03/15/2017 1140   LDLCALC 81 09/12/2017 0927   Hepatic Function Panel     Component Value Date/Time   PROT 6.9 09/12/2017 0927   ALBUMIN 4.1 09/12/2017 0927   AST 8 09/12/2017 0927   ALT 9 09/12/2017 0927   ALKPHOS 78 09/12/2017 0927   BILITOT 0.2 09/12/2017 0927   BILIDIR 0.0 06/09/2014 0843   IBILI 0.1 (L) 02/03/2014 1648      Component Value Date/Time   TSH 1.710 05/23/2017 1025   TSH 1.26 03/15/2017 1140   TSH 1.68 11/06/2016 1438   Results for Brandi, Lester (MRN [id number]) as of 12/11/2017 17:07  Ref. Range 09/12/2017 09:27  Vitamin D, 25-Hydroxy Latest Ref Range: 30.0 - 100.0 ng/mL 27.9 (L)   ASSESSMENT AND PLAN: Vitamin D deficiency - Plan: Vitamin D, Ergocalciferol, (DRISDOL) 50000 units CAPS capsule  Essential hypertension  At risk for osteoporosis  Class 3 severe obesity with serious comorbidity and body mass index (BMI) of 40.0 to 44.9 in adult, unspecified obesity type (HCC)  PLAN:  Vitamin D Deficiency Brandi Lester was informed that low vitamin D levels contributes to fatigue and are associated with obesity, breast, and colon cancer. She agrees to continue to take prescription Vit D @50 ,000 IU every week #4 with no refills and will follow up for routine testing of vitamin D, at least 2-3 times per year. She was informed of the risk of over-replacement of vitamin D and agrees to not increase her dose unless she discusses this with [name] first. Brandi Lester agrees to follow up with our clinic in 2 weeks.  At risk for osteopenia and osteoporosis Brandi Lester is at risk for osteopenia and osteoporosis due to her vitamin D deficiency. She was  encouraged to take her vitamin D and follow her higher calcium diet and increase strengthening exercise to help strengthen her bones and decrease her risk of osteopenia and osteoporosis.  Hypertension We discussed sodium restriction, working  on healthy weight loss, and a regular exercise program as the means to achieve improved blood pressure control. Brandi Lester agreed with this plan and agreed to follow up as directed. We will continue to monitor her blood pressure as well as her progress with the above lifestyle modifications. She will continue her medications as prescribed and will watch for signs of hypotension as she continues her lifestyle modifications.  Obesity Brandi Lester is currently in the action stage of change. As such, her goal is to continue with weight loss efforts She has agreed to keep a food journal with 500 to 600 calories and 40 grams of protein at supper daily and follow the Category 2 plan Brandi Lester has been instructed to work up to a goal of 150 minutes of combined cardio and strengthening exercise per week for weight loss and overall health benefits. We discussed the following Behavioral Modification Strategies today: increasing lean protein intake and work on meal planning and easy cooking plans  Brandi Lester has agreed to follow up with our clinic in 2 weeks. She was informed of the importance of frequent follow up visits to maximize her success with intensive lifestyle modifications for her multiple health conditions.    OBESITY BEHAVIORAL INTERVENTION VISIT  Today's visit was # 14 out of 22.  Starting weight: 325 lbs Starting date: 05/23/17 Today's weight : 300 lbs Today's date: 12/11/2017 Total lbs lost to date: 25 (Patients must lose 7 lbs in the first 6 months to continue with counseling)   ASK: We discussed the diagnosis of obesity with Brandi Lester today and Berklee agreed to give [name] permission to discuss obesity behavioral modification therapy today.  ASSESS: Brandi Lester has the diagnosis of obesity and her BMI today is 43.05 Wen is in the action stage of change   ADVISE: Anica was educated on the multiple health risks of obesity as well as the benefit of weight loss to improve her health. She was  advised of the need for long term treatment and the importance of lifestyle modifications.  AGREE: Multiple dietary modification options and treatment options were discussed and  Jakeia agreed to the above obesity treatment plan.   Corey Skains, am acting as transcriptionist for [employer], PA-C I, Lacy Duverney [facility], have reviewed this note and agree with its content

## 2017-12-12 ENCOUNTER — Ambulatory Visit (INDEPENDENT_AMBULATORY_CARE_PROVIDER_SITE_OTHER): Payer: BC Managed Care – PPO | Admitting: *Deleted

## 2017-12-12 DIAGNOSIS — J309 Allergic rhinitis, unspecified: Secondary | ICD-10-CM | POA: Diagnosis not present

## 2017-12-25 ENCOUNTER — Ambulatory Visit (INDEPENDENT_AMBULATORY_CARE_PROVIDER_SITE_OTHER): Payer: BC Managed Care – PPO | Admitting: Physician Assistant

## 2017-12-25 VITALS — BP 131/80 | HR 100 | Temp 99.0°F | Ht 70.0 in | Wt 297.0 lb

## 2017-12-25 DIAGNOSIS — E559 Vitamin D deficiency, unspecified: Secondary | ICD-10-CM

## 2017-12-25 DIAGNOSIS — Z6841 Body Mass Index (BMI) 40.0 and over, adult: Secondary | ICD-10-CM | POA: Diagnosis not present

## 2017-12-26 MED FILL — MEDROXYPROGESTERONE 10 MG T: 10 | 30 days supply | Qty: 30 | Fill #0

## 2017-12-26 NOTE — Progress Notes (Signed)
Office: [phone]  /  Fax: [phone]   HPI:   Chief Complaint: OBESITY Brandi Lester is here to discuss her progress with her obesity treatment plan. She is on the Category 3 plan and is following her eating plan approximately 75 % of the time. She states she is walking for 30 minutes 4 times per week. Brandi Lester continues to do well with weight loss. She is more mindful of her eating and is now incorporating "freshley" prepackaged delivered meal services.  Her weight is 297 lb (134.7 kg) today and has had a weight loss of 3 pounds over a period of 2 weeks since her last visit. She has lost 28 lbs since starting treatment with [name].  Vitamin D Deficiency Brandi Lester has a diagnosis of vitamin D deficiency. She is currently taking prescription Vit D and denies nausea, vomiting or muscle weakness.  ALLERGIES: Allergies  Allergen Reactions  . Penicillins Itching and Swelling    Has patient had a PCN reaction causing immediate rash, facial/tongue/throat swelling, SOB or lightheadedness with hypotension: yes Has patient had a PCN reaction causing severe rash involving mucus membranes or skin necrosis: no Has patient had a PCN reaction that required hospitalization no Has patient had a PCN reaction occurring within the last 10 years: yes If all of the above answers are "NO", then may proceed with Cephalosporin use.   Brandi Lester [Liraglutide]     Vomiting    MEDICATIONS: Current Outpatient Medications on File Prior to Visit  Medication Sig Dispense Refill  . acetaminophen (TYLENOL 8 HOUR) 650 MG CR tablet Take 1 tablet (650 mg total) every 8 (eight) hours as needed by mouth for pain.    [name] amLODipine (NORVASC) 5 MG tablet Take 1 tablet (5 mg total) by mouth daily. 90 tablet 1  . aspirin EC 81 MG tablet Take 1 tablet (81 mg total) by mouth daily.    [name] azelastine (ASTELIN) 0.1 % nasal spray Place 2 sprays into both nostrils 2 (two) times daily. Use in each nostril as directed 90 mL 1  .  Cyanocobalamin (B-12) 1000 MCG CAPS Take by mouth daily.    [name] EPINEPHrine (EPIPEN 2-PAK) 0.3 mg/0.3 mL IJ SOAJ injection Use as directed for severe allergic reaction 2 Device 1  . fexofenadine (ALLEGRA ALLERGY) 180 MG tablet Take 1 tablet (180 mg total) by mouth daily. 301 tablet 1  . fluticasone (FLONASE) 50 MCG/ACT nasal spray Place 2 sprays into both nostrils daily. (Patient taking differently: Place 2 sprays into both nostrils daily as needed for allergies. ) 16 g 11  . glucose blood test strip OneTouch Ultra Test strips    . ibuprofen (ADVIL,MOTRIN) 600 MG tablet Take 1 tablet (600 mg total) by mouth every 6 (six) hours as needed. 90 tablet 0  . losartan-hydrochlorothiazide (HYZAAR) 100-25 MG tablet TAKE 1 TABLET BY MOUTH DAILY. 90 tablet 1  . metFORMIN (GLUCOPHAGE) 500 MG tablet TAKE 1 TABLET (500 MG TOTAL) BY MOUTH DAILY WITH BREAKFAST. 90 tablet 1  . montelukast (SINGULAIR) 10 MG tablet Take 1 tablet (10 mg total) by mouth at bedtime. 90 tablet 1  . Omega-3 Fatty Acids (FISH OIL PO) Take 1 capsule by mouth daily.    Brandi Lester DELICA LANCETS 08X MISC OneTouch Delica Lancets 33 gauge    . pantoprazole (PROTONIX) 40 MG tablet Take 1 tablet (40 mg total) by mouth daily. 90 tablet 1  . potassium chloride SA (K-DUR,KLOR-CON) 20 MEQ tablet Take 1 tablet (20 mEq total) by mouth 3 (three)  times daily. 120 tablet 3  . Probiotic Product (PROBIOTIC-10 PO) Take by mouth daily.    . TURMERIC PO Take 1 capsule by mouth daily.    . Vitamin D, Ergocalciferol, (DRISDOL) 50000 units CAPS capsule Take 1 capsule (50,000 Units total) by mouth every 7 (seven) days. 4 capsule 0   No current facility-administered medications on file prior to visit.     PAST MEDICAL HISTORY: Past Medical History:  Diagnosis Date  . ADD (attention deficit disorder)   . Allergy    allergic rhinitis  . Anemia 07/25/2013  . Back pain   . Costochondritis 02/20/2015  . Diabetes mellitus    Type II   previously 3 years ago - no  longer on meds   . Dyslipidemia 07/25/2013  . Fungus infection 10/13   Left great toe  . GERD (gastroesophageal reflux disease)   . Hypertension   . IBS (irritable bowel syndrome)   . Ingrown nail 10/13   right foot next to the last toe  . Insomnia 04/20/2013  . Joint pain   . Lactose intolerance   . Leg edema   . Obesity   . Pedal edema 02/20/2015  . Prediabetes   . Preventative health care 09/25/2015  . PVC (premature ventricular contraction)     PAST SURGICAL HISTORY: Past Surgical History:  Procedure Laterality Date  . HYSTEROSCOPY N/A 10/04/2015   Procedure: HYSTEROSCOPY with Removal IUD;  Surgeon: Vanessa Kick, MD;  Location: [facility];  Service: Gynecology;  Laterality: N/A;  . lasik     eye surgery  . WISDOM TOOTH EXTRACTION      SOCIAL HISTORY: Social History   Tobacco Use  . Smoking status: Former Smoker    Packs/day: 1.00    Years: 15.00    Pack years: 15.00    Last attempt to quit: 09/03/2001    Years since quitting: 16.3  . Smokeless tobacco: Never Used  Substance Use Topics  . Alcohol use: Yes    Comment: rare  . Drug use: No    FAMILY HISTORY: Family History  Problem Relation Age of Onset  . Heart attack Mother 67  . Hypertension Mother   . Heart disease Mother   . Obesity Mother   . Cancer Father        prostate  . Heart attack Sister 47  . Hypertension Other     ROS: Review of Systems  Constitutional: Positive for weight loss.  Gastrointestinal: Negative for nausea and vomiting.  Musculoskeletal:       Negative muscle weakness    PHYSICAL EXAM: Blood pressure 131/80, pulse 100, temperature 99 F (37.2 C), temperature source Oral, height 5\' 10"  (1.778 m), weight 297 lb (134.7 kg), SpO2 100 %. Body mass index is 42.62 kg/m. Physical Exam  Constitutional: She is oriented to person, place, and time. She appears well-developed and well-nourished.  Cardiovascular: Normal rate.  Pulmonary/Chest: Effort normal.  Musculoskeletal: Normal range  of motion.  Neurological: She is oriented to person, place, and time.  Skin: Skin is warm and dry.  Psychiatric: She has a normal mood and affect. Her behavior is normal.  Vitals reviewed.   RECENT LABS AND TESTS: BMET    Component Value Date/Time   NA 137 09/12/2017 0927   K 3.6 09/12/2017 0927   CL 101 09/12/2017 0927   CO2 25 09/12/2017 0927   GLUCOSE 85 09/12/2017 0927   GLUCOSE 107 (H) 03/15/2017 1140   BUN 10 09/12/2017 0927   CREATININE 0.65 09/12/2017  2202   CREATININE 0.72 02/03/2014 1648   CALCIUM 9.0 09/12/2017 0927   GFRNONAA 105 09/12/2017 0927   GFRAA 121 09/12/2017 0927   Lab Results  Component Value Date   HGBA1C 5.8 (H) 09/12/2017   HGBA1C 6.2 (H) 05/23/2017   HGBA1C 6.6 (H) 03/15/2017   HGBA1C 6.3 11/06/2016   HGBA1C 6.2 07/18/2016   Lab Results  Component Value Date   INSULIN 12.9 09/12/2017   INSULIN 31.1 (H) 05/23/2017   CBC    Component Value Date/Time   WBC 7.0 05/23/2017 1025   WBC 6.7 03/15/2017 1140   RBC 4.46 05/23/2017 1025   RBC 4.49 03/15/2017 1140   HGB 12.1 05/23/2017 1025   HCT 36.6 05/23/2017 1025   PLT 273.0 03/15/2017 1140   MCV 82 05/23/2017 1025   MCH 27.1 05/23/2017 1025   MCH 28.2 02/03/2014 1648   MCHC 33.1 05/23/2017 1025   MCHC 34.0 03/15/2017 1140   RDW 14.9 05/23/2017 1025   LYMPHSABS 1.7 05/23/2017 1025   MONOABS 0.4 06/09/2014 0843   EOSABS 0.1 05/23/2017 1025   BASOSABS 0.0 05/23/2017 1025   Iron/TIBC/Ferritin/ %Sat No results found for: IRON, TIBC, FERRITIN, IRONPCTSAT Lipid Panel     Component Value Date/Time   CHOL 132 09/12/2017 0927   TRIG 93 09/12/2017 0927   HDL 32 (L) 09/12/2017 0927   CHOLHDL 4 03/15/2017 1140   VLDL 26.6 03/15/2017 1140   LDLCALC 81 09/12/2017 0927   Hepatic Function Panel     Component Value Date/Time   PROT 6.9 09/12/2017 0927   ALBUMIN 4.1 09/12/2017 0927   AST 8 09/12/2017 0927   ALT 9 09/12/2017 0927   ALKPHOS 78 09/12/2017 0927   BILITOT 0.2 09/12/2017 0927    BILIDIR 0.0 06/09/2014 0843   IBILI 0.1 (L) 02/03/2014 1648      Component Value Date/Time   TSH 1.710 05/23/2017 1025   TSH 1.26 03/15/2017 1140   TSH 1.68 11/06/2016 1438  Results for DEMYAH, SMYRE (MRN [id number]) as of 12/26/2017 07:51  Ref. Range 09/12/2017 09:27  Vitamin D, 25-Hydroxy Latest Ref Range: 30.0 - 100.0 ng/mL 27.9 (L)    ASSESSMENT AND PLAN: Vitamin D deficiency  Class 3 severe obesity with serious comorbidity and body mass index (BMI) of 40.0 to 44.9 in adult, unspecified obesity type (HCC)  PLAN:  Vitamin D Deficiency Brandi Lester was informed that low vitamin D levels contributes to fatigue and are associated with obesity, breast, and colon cancer. Brandi Lester agrees to continue taking prescription Vit D @50 ,000 IU every week and will follow up for routine testing of vitamin D, at least 2-3 times per year. She was informed of the risk of over-replacement of vitamin D and agrees to not increase her dose unless she discusses this with [name] first. Brandi Lester agrees to follow up with our clinic in 2 weeks.  We spent > than 50% of the 15 minute visit on the counseling as documented in the note.  Obesity Brandi Lester is currently in the action stage of change. As such, her goal is to continue with weight loss efforts She has agreed to keep a food journal with 600 calories and 40 grams of protein at supper daily and follow the Category 3 plan Brandi Lester has been instructed to work up to a goal of 150 minutes of combined cardio and strengthening exercise per week for weight loss and overall health benefits. We discussed the following Behavioral Modification Strategies today: increasing lean protein intake and keep a strict food  journal   Brandi Lester has agreed to follow up with our clinic in 2 weeks. She was informed of the importance of frequent follow up visits to maximize her success with intensive lifestyle modifications for her multiple health conditions.   OBESITY BEHAVIORAL INTERVENTION  VISIT  Today's visit was # 15 out of 22.  Starting weight: 325 lbs Starting date: 05/23/17 Today's weight : 297 lbs Today's date: 12/25/2017 Total lbs lost to date: 39 (Patients must lose 7 lbs in the first 6 months to continue with counseling)   ASK: We discussed the diagnosis of obesity with Brandi Lester today and Brandi Lester agreed to give [name] permission to discuss obesity behavioral modification therapy today.  ASSESS: Brandi Lester has the diagnosis of obesity and her BMI today is 42.61 Brandi Lester is in the action stage of change   ADVISE: Brandi Lester was educated on the multiple health risks of obesity as well as the benefit of weight loss to improve her health. She was advised of the need for long term treatment and the importance of lifestyle modifications.  AGREE: Multiple dietary modification options and treatment options were discussed and  Brandi Lester agreed to the above obesity treatment plan.   Brandi Lester, am acting as transcriptionist for Lacy Duverney, PA-C I, Lacy Duverney [facility], have reviewed this note and agree with its content

## 2018-01-13 ENCOUNTER — Ambulatory Visit (INDEPENDENT_AMBULATORY_CARE_PROVIDER_SITE_OTHER): Payer: BC Managed Care – PPO | Admitting: Physician Assistant

## 2018-01-13 ENCOUNTER — Encounter (INDEPENDENT_AMBULATORY_CARE_PROVIDER_SITE_OTHER): Payer: Self-pay

## 2018-01-15 ENCOUNTER — Other Ambulatory Visit (INDEPENDENT_AMBULATORY_CARE_PROVIDER_SITE_OTHER): Payer: Self-pay | Admitting: Physician Assistant

## 2018-01-15 DIAGNOSIS — E559 Vitamin D deficiency, unspecified: Secondary | ICD-10-CM

## 2018-01-15 MED FILL — POTASSIUM CL ER 20 MEQ TAB: 20 | 30 days supply | Qty: 120 | Fill #3

## 2018-01-15 MED FILL — AMLODIPINE BESYLATE 5 MG TA: 5 | 90 days supply | Qty: 90 | Fill #1

## 2018-01-16 ENCOUNTER — Ambulatory Visit (INDEPENDENT_AMBULATORY_CARE_PROVIDER_SITE_OTHER): Payer: BC Managed Care – PPO

## 2018-01-16 DIAGNOSIS — J309 Allergic rhinitis, unspecified: Secondary | ICD-10-CM | POA: Diagnosis not present

## 2018-01-17 ENCOUNTER — Other Ambulatory Visit (INDEPENDENT_AMBULATORY_CARE_PROVIDER_SITE_OTHER): Payer: Self-pay | Admitting: Physician Assistant

## 2018-01-17 DIAGNOSIS — E559 Vitamin D deficiency, unspecified: Secondary | ICD-10-CM

## 2018-01-20 ENCOUNTER — Encounter (INDEPENDENT_AMBULATORY_CARE_PROVIDER_SITE_OTHER): Payer: Self-pay

## 2018-01-20 ENCOUNTER — Telehealth (INDEPENDENT_AMBULATORY_CARE_PROVIDER_SITE_OTHER): Payer: Self-pay | Admitting: Physician Assistant

## 2018-01-20 NOTE — Telephone Encounter (Signed)
Need vitamin D , pharmacy told patient we declined it , she needs the vitamin d and request it be refilled

## 2018-01-20 NOTE — Telephone Encounter (Signed)
Sent the patient a my chart message. Brandi Lester, [place]

## 2018-01-21 MED FILL — MEDROXYPROGESTERONE 10 MG T: 10 | 30 days supply | Qty: 30 | Fill #1

## 2018-01-30 ENCOUNTER — Ambulatory Visit (INDEPENDENT_AMBULATORY_CARE_PROVIDER_SITE_OTHER): Payer: BC Managed Care – PPO | Admitting: Physician Assistant

## 2018-01-30 VITALS — BP 132/84 | HR 95 | Temp 99.1°F | Ht 70.0 in | Wt 303.0 lb

## 2018-01-30 DIAGNOSIS — E559 Vitamin D deficiency, unspecified: Secondary | ICD-10-CM | POA: Diagnosis not present

## 2018-01-30 DIAGNOSIS — I10 Essential (primary) hypertension: Secondary | ICD-10-CM

## 2018-01-30 DIAGNOSIS — Z6841 Body Mass Index (BMI) 40.0 and over, adult: Secondary | ICD-10-CM | POA: Diagnosis not present

## 2018-01-30 DIAGNOSIS — Z9189 Other specified personal risk factors, not elsewhere classified: Secondary | ICD-10-CM

## 2018-01-30 DIAGNOSIS — E66813 Obesity, class 3: Secondary | ICD-10-CM

## 2018-01-30 MED ORDER — VITAMIN D (ERGOCALCIFEROL) 1.25 MG (50000 UNIT) PO CAPS: 50000.0000 [IU] | ORAL_CAPSULE | ORAL | 0 refills | Status: DC

## 2018-01-30 MED FILL — VIT D2 1.25 MG (50,000 UNIT: 1.25 MG | 28 days supply | Qty: 4 | Fill #0

## 2018-01-30 NOTE — Progress Notes (Signed)
Office: [phone]  /  Fax: [phone]   HPI:   Chief Complaint: OBESITY Brandi Lester is here to discuss her progress with her obesity treatment plan. She is on the keep a food journal with 600 calories and 40 grams of protein at supper daily and follow the Category 3 plan and is following her eating plan approximately 70 % of the time. She states she is walking for 20 minutes 5 times per week. Brandi Lester has missed last appointment. States she has been busy and had increase in emotional eating.  Her weight is (!) 303 lb (137.4 kg) today and has gained 6 pounds since her last visit. She has lost 22 lbs since starting treatment with [name].  Vitamin D Deficiency Brandi Lester has a diagnosis of vitamin D deficiency. She is currently taking prescription Vit D and denies nausea, vomiting or muscle weakness.  At risk for osteopenia and osteoporosis Brandi Lester is at higher risk of osteopenia and osteoporosis due to vitamin D deficiency.   Hypertension Brandi Lester is a 52 y.o. female with hypertension. Brandi Lester's blood pressure is stable and she denies chest pain or shortness of breath. She is working weight loss to help control her blood pressure with the goal of decreasing her risk of heart attack and stroke. Brandi Lester's blood pressure is currently controlled.  ALLERGIES: Allergies  Allergen Reactions  . Penicillins Itching and Swelling    Has patient had a PCN reaction causing immediate rash, facial/tongue/throat swelling, SOB or lightheadedness with hypotension: yes Has patient had a PCN reaction causing severe rash involving mucus membranes or skin necrosis: no Has patient had a PCN reaction that required hospitalization no Has patient had a PCN reaction occurring within the last 10 years: yes If all of the above answers are "NO", then may proceed with Cephalosporin use.   Brandi Lester [Liraglutide]     Vomiting    MEDICATIONS: Current Outpatient Medications on File Prior to Visit  Medication Sig  Dispense Refill  . acetaminophen (TYLENOL 8 HOUR) 650 MG CR tablet Take 1 tablet (650 mg total) every 8 (eight) hours as needed by mouth for pain.    [name] amLODipine (NORVASC) 5 MG tablet Take 1 tablet (5 mg total) by mouth daily. 90 tablet 1  . aspirin EC 81 MG tablet Take 1 tablet (81 mg total) by mouth daily.    [name] azelastine (ASTELIN) 0.1 % nasal spray Place 2 sprays into both nostrils 2 (two) times daily. Use in each nostril as directed 90 mL 1  . Cyanocobalamin (B-12) 1000 MCG CAPS Take by mouth daily.    [name] EPINEPHrine (EPIPEN 2-PAK) 0.3 mg/0.3 mL IJ SOAJ injection Use as directed for severe allergic reaction 2 Device 1  . fexofenadine (ALLEGRA ALLERGY) 180 MG tablet Take 1 tablet (180 mg total) by mouth daily. 301 tablet 1  . fluticasone (FLONASE) 50 MCG/ACT nasal spray Place 2 sprays into both nostrils daily. (Patient taking differently: Place 2 sprays into both nostrils daily as needed for allergies. ) 16 g 11  . glucose blood test strip OneTouch Ultra Test strips    . ibuprofen (ADVIL,MOTRIN) 600 MG tablet Take 1 tablet (600 mg total) by mouth every 6 (six) hours as needed. 90 tablet 0  . losartan-hydrochlorothiazide (HYZAAR) 100-25 MG tablet TAKE 1 TABLET BY MOUTH DAILY. 90 tablet 1  . metFORMIN (GLUCOPHAGE) 500 MG tablet TAKE 1 TABLET (500 MG TOTAL) BY MOUTH DAILY WITH BREAKFAST. 90 tablet 1  . montelukast (SINGULAIR) 10 MG tablet Take 1 tablet (  10 mg total) by mouth at bedtime. 90 tablet 1  . Omega-3 Fatty Acids (FISH OIL PO) Take 1 capsule by mouth daily.    Brandi Lester LANCETS 62G MISC OneTouch Lester Lancets 33 gauge    . pantoprazole (PROTONIX) 40 MG tablet Take 1 tablet (40 mg total) by mouth daily. 90 tablet 1  . potassium chloride SA (K-DUR,KLOR-CON) 20 MEQ tablet Take 1 tablet (20 mEq total) by mouth 3 (three) times daily. 120 tablet 3  . Probiotic Product (PROBIOTIC-10 PO) Take by mouth daily.    . TURMERIC PO Take 1 capsule by mouth daily.     No current  facility-administered medications on file prior to visit.     PAST MEDICAL HISTORY: Past Medical History:  Diagnosis Date  . ADD (attention deficit disorder)   . Allergy    allergic rhinitis  . Anemia 07/25/2013  . Back pain   . Costochondritis 02/20/2015  . Diabetes mellitus    Type II   previously 3 years ago - no longer on meds   . Dyslipidemia 07/25/2013  . Fungus infection 10/13   Left great toe  . GERD (gastroesophageal reflux disease)   . Hypertension   . IBS (irritable bowel syndrome)   . Ingrown nail 10/13   right foot next to the last toe  . Insomnia 04/20/2013  . Joint pain   . Lactose intolerance   . Leg edema   . Obesity   . Pedal edema 02/20/2015  . Prediabetes   . Preventative health care 09/25/2015  . PVC (premature ventricular contraction)     PAST SURGICAL HISTORY: Past Surgical History:  Procedure Laterality Date  . HYSTEROSCOPY N/A 10/04/2015   Procedure: HYSTEROSCOPY with Removal IUD;  Surgeon: Vanessa Kick, MD;  Location: [facility];  Service: Gynecology;  Laterality: N/A;  . lasik     eye surgery  . WISDOM TOOTH EXTRACTION      SOCIAL HISTORY: Social History   Tobacco Use  . Smoking status: Former Smoker    Packs/day: 1.00    Years: 15.00    Pack years: 15.00    Last attempt to quit: 09/03/2001    Years since quitting: 16.4  . Smokeless tobacco: Never Used  Substance Use Topics  . Alcohol use: Yes    Comment: rare  . Drug use: No    FAMILY HISTORY: Family History  Problem Relation Age of Onset  . Heart attack Mother 19  . Hypertension Mother   . Heart disease Mother   . Obesity Mother   . Cancer Father        prostate  . Heart attack Sister 73  . Hypertension Other     ROS: Review of Systems  Constitutional: Negative for weight loss.  Respiratory: Negative for shortness of breath.   Cardiovascular: Negative for chest pain.  Gastrointestinal: Negative for nausea and vomiting.  Musculoskeletal:       Negative muscle weakness     PHYSICAL EXAM: Blood pressure 132/84, pulse 95, temperature 99.1 F (37.3 C), temperature source Oral, height 5\' 10"  (1.778 m), weight (!) 303 lb (137.4 kg), SpO2 96 %. Body mass index is 43.48 kg/m. Physical Exam  Constitutional: She is oriented to person, place, and time. She appears well-developed and well-nourished.  Cardiovascular: Normal rate.  Pulmonary/Chest: Effort normal.  Musculoskeletal: Normal range of motion.  Neurological: She is oriented to person, place, and time.  Skin: Skin is warm and dry.  Psychiatric: She has a normal mood and affect.  Her behavior is normal.  Vitals reviewed.   RECENT LABS AND TESTS: BMET    Component Value Date/Time   NA 137 09/12/2017 0927   K 3.6 09/12/2017 0927   CL 101 09/12/2017 0927   CO2 25 09/12/2017 0927   GLUCOSE 85 09/12/2017 0927   GLUCOSE 107 (H) 03/15/2017 1140   BUN 10 09/12/2017 0927   CREATININE 0.65 09/12/2017 0927   CREATININE 0.72 02/03/2014 1648   CALCIUM 9.0 09/12/2017 0927   GFRNONAA 105 09/12/2017 0927   GFRAA 121 09/12/2017 0927   Lab Results  Component Value Date   HGBA1C 5.8 (H) 09/12/2017   HGBA1C 6.2 (H) 05/23/2017   HGBA1C 6.6 (H) 03/15/2017   HGBA1C 6.3 11/06/2016   HGBA1C 6.2 07/18/2016   Lab Results  Component Value Date   INSULIN 12.9 09/12/2017   INSULIN 31.1 (H) 05/23/2017   CBC    Component Value Date/Time   WBC 7.0 05/23/2017 1025   WBC 6.7 03/15/2017 1140   RBC 4.46 05/23/2017 1025   RBC 4.49 03/15/2017 1140   HGB 12.1 05/23/2017 1025   HCT 36.6 05/23/2017 1025   PLT 273.0 03/15/2017 1140   MCV 82 05/23/2017 1025   MCH 27.1 05/23/2017 1025   MCH 28.2 02/03/2014 1648   MCHC 33.1 05/23/2017 1025   MCHC 34.0 03/15/2017 1140   RDW 14.9 05/23/2017 1025   LYMPHSABS 1.7 05/23/2017 1025   MONOABS 0.4 06/09/2014 0843   EOSABS 0.1 05/23/2017 1025   BASOSABS 0.0 05/23/2017 1025   Iron/TIBC/Ferritin/ %Sat No results found for: IRON, TIBC, FERRITIN, IRONPCTSAT Lipid Panel      Component Value Date/Time   CHOL 132 09/12/2017 0927   TRIG 93 09/12/2017 0927   HDL 32 (L) 09/12/2017 0927   CHOLHDL 4 03/15/2017 1140   VLDL 26.6 03/15/2017 1140   LDLCALC 81 09/12/2017 0927   Hepatic Function Panel     Component Value Date/Time   PROT 6.9 09/12/2017 0927   ALBUMIN 4.1 09/12/2017 0927   AST 8 09/12/2017 0927   ALT 9 09/12/2017 0927   ALKPHOS 78 09/12/2017 0927   BILITOT 0.2 09/12/2017 0927   BILIDIR 0.0 06/09/2014 0843   IBILI 0.1 (L) 02/03/2014 1648      Component Value Date/Time   TSH 1.710 05/23/2017 1025   TSH 1.26 03/15/2017 1140   TSH 1.68 11/06/2016 1438  Results for Brandi, Lester (MRN [id number]) as of 01/30/2018 17:02  Ref. Range 09/12/2017 09:27  Vitamin D, 25-Hydroxy Latest Ref Range: 30.0 - 100.0 ng/mL 27.9 (L)    ASSESSMENT AND PLAN: Vitamin D deficiency - Plan: Vitamin D, Ergocalciferol, (DRISDOL) 50000 units CAPS capsule  Essential hypertension  At risk for osteoporosis  Class 3 severe obesity with serious comorbidity and body mass index (BMI) of 40.0 to 44.9 in adult, unspecified obesity type (HCC)  PLAN:  Vitamin D Deficiency Brandi Lester was informed that low vitamin D levels contributes to fatigue and are associated with obesity, breast, and colon cancer. Brandi Lester agrees to continue taking prescription Vit D @50 ,000 IU every week #4 and we will refill for 1 month. She will follow up for routine testing of vitamin D, at least 2-3 times per year. She was informed of the risk of over-replacement of vitamin D and agrees to not increase her dose unless she discusses this with [name] first. Brandi Lester agrees to follow up with our clinic in 2 weeks.  At risk for osteopenia and osteoporosis Brandi Lester is at risk for osteopenia and osteoporsis due to  her vitamin D deficiency. She was encouraged to take her vitamin D and follow her higher calcium diet and increase strengthening exercise to help strengthen her bones and decrease her risk of osteopenia and  osteoporosis.  Hypertension We discussed sodium restriction, working on healthy weight loss, and a regular exercise program as the means to achieve improved blood pressure control. Brandi Lester agreed with this plan and agreed to follow up as directed. We will continue to monitor her blood pressure as well as her progress with the above lifestyle modifications. She will continue her medications as prescribed and will watch for signs of hypotension as she continues her lifestyle modifications. Brandi Lester agrees to follow up with our clinic in 2 weeks.  Obesity Brandi Lester is currently in the action stage of change. As such, her goal is to continue with weight loss efforts She has agreed to portion control better and make smarter food choices, such as increase vegetables and decrease simple carbohydrates  Brandi Lester has been instructed to work up to a goal of 150 minutes of combined cardio and strengthening exercise per week for weight loss and overall health benefits. We discussed the following Behavioral Modification Strategies today: increasing lean protein intake and work on meal planning and easy cooking plans   Brandi Lester has agreed to follow up with our clinic in 2 weeks. She was informed of the importance of frequent follow up visits to maximize her success with intensive lifestyle modifications for her multiple health conditions.   OBESITY BEHAVIORAL INTERVENTION VISIT  Today's visit was # 16 out of 22.  Starting weight: 325 lbs Starting date: 05/23/17 Today's weight : 303 lbs Today's date: 01/30/2018 Total lbs lost to date: 18 (Patients must lose 7 lbs in the first 6 months to continue with counseling)   ASK: We discussed the diagnosis of obesity with Brandi Lester today and Brandi Lester agreed to give [name] permission to discuss obesity behavioral modification therapy today.  ASSESS: Brandi Lester has the diagnosis of obesity and her BMI today is 43.48 Brandi Lester is in the action stage of change    ADVISE: Brandi Lester was educated on the multiple health risks of obesity as well as the benefit of weight loss to improve her health. She was advised of the need for long term treatment and the importance of lifestyle modifications.  AGREE: Multiple dietary modification options and treatment options were discussed and  Brandi Lester agreed to the above obesity treatment plan.   Brandi Lester, am acting as transcriptionist for Lacy Duverney, PA-C I, Lacy Duverney [facility], have reviewed this note and agree with its content

## 2018-02-11 ENCOUNTER — Encounter: Payer: Self-pay | Admitting: Allergy and Immunology

## 2018-02-11 ENCOUNTER — Ambulatory Visit: Payer: BC Managed Care – PPO | Admitting: Allergy and Immunology

## 2018-02-11 VITALS — BP 156/76 | HR 88 | Resp 18 | Ht 66.5 in | Wt 306.8 lb

## 2018-02-11 DIAGNOSIS — H1010 Acute atopic conjunctivitis, unspecified eye: Secondary | ICD-10-CM

## 2018-02-11 DIAGNOSIS — K219 Gastro-esophageal reflux disease without esophagitis: Secondary | ICD-10-CM | POA: Diagnosis not present

## 2018-02-11 DIAGNOSIS — J3089 Other allergic rhinitis: Secondary | ICD-10-CM | POA: Diagnosis not present

## 2018-02-11 NOTE — Patient Instructions (Addendum)
  1. This summer return for skin testing without antihistamines   2. Prednisone 10mg  one tablet one time per day for 10 days only  3. Continue fluticasone 1-2 sprays each nostril 1 time per day  4. Continue Pantoprazole 40 one time per day  5. Use azelastine nasal spray, nasal saline, anti-histamine if needed  6. Continue immunotherapy?

## 2018-02-11 NOTE — Progress Notes (Signed)
Follow-up Note  Referring Provider: Mosie Lukes, MD Primary Provider: Mosie Lukes, MD Date of Office Visit: 02/11/2018  Subjective:   Brandi Lester (DOB: 11-18-1965) is a 52 y.o. female who returns to the [facility] on 02/11/2018 in re-evaluation of the following:  HPI: Brandi Lester returns to this clinic in reevaluation of allergic rhinoconjunctivitis.  Her last visit to this clinic was 21 May 2017.  She has been doing relatively well with her upper airway disease but this spring she has developed some problems with grass exposure.  Upon contact with grass she has developed a red swollen dermatitis affecting her hands and at one point she touched grass and then touched her eye and her eyes swelled up.  In addition, she has had issues with eye swelling when going outdoors during very high levels of grass pollen.  In addition, over the course of the past week or so she has had a little bit more problems with nasal congestion and some facial fullness without any anosmia or ugly nasal discharge or fever but some cheek discomfort.  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  The only other issue that she had occurred early this February when she had a "sinusitis" that required an antibiotic and steroid shot administration.  She also appears to have very good control of her reflux on her current pantoprazole.  She has been receiving immunotherapy and she is completing her 5th year of immunotherapy.  Allergies as of 02/11/2018      Reactions   Penicillins Itching, Swelling   Has patient had a PCN reaction causing immediate rash, facial/tongue/throat swelling, SOB or lightheadedness with hypotension: yes Has patient had a PCN reaction causing severe rash involving mucus membranes or skin necrosis: no Has patient had a PCN reaction that required hospitalization no Has patient had a PCN reaction occurring within the last 10 years: yes If all  of the above answers are "NO", then may proceed with Cephalosporin use.   Victoza [liraglutide]    Vomiting      Medication List      acetaminophen 650 MG CR tablet Commonly known as:  TYLENOL 8 HOUR Take 1 tablet (650 mg total) every 8 (eight) hours as needed by mouth for pain.   amLODipine 5 MG tablet Commonly known as:  NORVASC Take 1 tablet (5 mg total) by mouth daily.   aspirin EC 81 MG tablet Take 1 tablet (81 mg total) by mouth daily.   azelastine 0.1 % nasal spray Commonly known as:  ASTELIN Place 2 sprays into both nostrils 2 (two) times daily. Use in each nostril as directed   B-12 1000 MCG Caps Take by mouth daily.   EPINEPHrine 0.3 mg/0.3 mL Soaj injection Commonly known as:  EPIPEN 2-PAK Use as directed for severe allergic reaction   fexofenadine 180 MG tablet Commonly known as:  ALLEGRA ALLERGY Take 1 tablet (180 mg total) by mouth daily.   FISH OIL PO Take 1 capsule by mouth daily.   fluticasone 50 MCG/ACT nasal spray Commonly known as:  FLONASE Place 2 sprays into both nostrils daily.   glucose blood test strip OneTouch Ultra Test strips   ibuprofen 600 MG tablet Commonly known as:  ADVIL,MOTRIN Take 1 tablet (600 mg total) by mouth every 6 (six) hours as needed.   losartan-hydrochlorothiazide 100-25 MG tablet Commonly known as:  HYZAAR TAKE 1 TABLET BY MOUTH DAILY.   metFORMIN 500 MG tablet Commonly  known as:  GLUCOPHAGE TAKE 1 TABLET (500 MG TOTAL) BY MOUTH DAILY WITH BREAKFAST.   montelukast 10 MG tablet Commonly known as:  SINGULAIR Take 1 tablet (10 mg total) by mouth at bedtime.   ONETOUCH DELICA LANCETS 24M Misc OneTouch Delica Lancets 33 gauge   pantoprazole 40 MG tablet Commonly known as:  PROTONIX Take 1 tablet (40 mg total) by mouth daily.   potassium chloride SA 20 MEQ tablet Commonly known as:  K-DUR,KLOR-CON Take 1 tablet (20 mEq total) by mouth 3 (three) times daily.   PROBIOTIC-10 PO Take by mouth daily.     TURMERIC PO Take 1 capsule by mouth daily.   Vitamin D (Ergocalciferol) 50000 units Caps capsule Commonly known as:  DRISDOL Take 1 capsule (50,000 Units total) by mouth every 7 (seven) days.       Past Medical History:  Diagnosis Date  . ADD (attention deficit disorder)   . Allergy    allergic rhinitis  . Anemia 07/25/2013  . Angio-edema   . Back pain   . Costochondritis 02/20/2015  . Diabetes mellitus    Type II   previously 3 years ago - no longer on meds   . Dyslipidemia 07/25/2013  . Fungus infection 10/13   Left great toe  . GERD (gastroesophageal reflux disease)   . Hypertension   . IBS (irritable bowel syndrome)   . Ingrown nail 10/13   right foot next to the last toe  . Insomnia 04/20/2013  . Joint pain   . Lactose intolerance   . Leg edema   . Obesity   . Pedal edema 02/20/2015  . Prediabetes   . Preventative health care 09/25/2015  . PVC (premature ventricular contraction)     Past Surgical History:  Procedure Laterality Date  . HYSTEROSCOPY N/A 10/04/2015   Procedure: HYSTEROSCOPY with Removal IUD;  Surgeon: Vanessa Kick, MD;  Location: [facility];  Service: Gynecology;  Laterality: N/A;  . lasik     eye surgery  . WISDOM TOOTH EXTRACTION      Review of systems negative except as noted in HPI / PMHx or noted below:  Review of Systems  Constitutional: Negative.   HENT: Negative.   Eyes: Negative.   Respiratory: Negative.   Cardiovascular: Negative.   Gastrointestinal: Negative.   Genitourinary: Negative.   Musculoskeletal: Negative.   Skin: Negative.   Neurological: Negative.   Endo/Heme/Allergies: Negative.   Psychiatric/Behavioral: Negative.      Objective:   Vitals:   02/11/18 1710  BP: (!) 156/76  Pulse: 88  Resp: 18  SpO2: 97%   Height: 5' 6.5" (168.9 cm)  Weight: (!) 306 lb 12.8 oz (139.2 kg)   Physical Exam  HENT:  Head: Normocephalic.  Right Ear: Tympanic membrane, external ear and ear canal normal.  Left Ear: Tympanic  membrane, external ear and ear canal normal.  Nose: Nose normal. No mucosal edema or rhinorrhea.  Mouth/Throat: Uvula is midline, oropharynx is clear and moist and mucous membranes are normal. No oropharyngeal exudate.  Eyes: Conjunctivae are normal.  Neck: Trachea normal. No tracheal tenderness present. No tracheal deviation present. No thyromegaly present.  Cardiovascular: Normal rate, regular rhythm, S1 normal, S2 normal and normal heart sounds.  No murmur heard. Pulmonary/Chest: Breath sounds normal. No stridor. No respiratory distress. She has no wheezes. She has no rales.  Musculoskeletal: She exhibits no edema.  Lymphadenopathy:       Head (right side): No tonsillar adenopathy present.       Head (  left side): No tonsillar adenopathy present.    She has no cervical adenopathy.  Neurological: She is alert.  Skin: Rash (Slight erythema dorsal surface hands bilaterally) noted. She is not diaphoretic. No erythema. Nails show no clubbing.    Diagnostics: none  Assessment and Plan:   1. Other allergic rhinitis   2. Seasonal allergic conjunctivitis   3. Gastroesophageal reflux disease, esophagitis presence not specified     1. This summer return for skin testing without antihistamines   2. Prednisone 10mg  one tablet one time per day for 10 days only  3. Continue fluticasone 1-2 sprays each nostril 1 time per day  4. Continue Pantoprazole 40 one time per day  5. Use azelastine nasal spray, nasal saline, anti-histamine if needed  6. Continue immunotherapy?  I will see Brandi Lester back in this clinic for skin testing to see if she still has immunological hyperreactivity directed against various aeroallergens.  Based upon her history of grass exposure I suspect that she will still be allergic to grass and we may need to continue her on immunotherapy directed against this allergen.  She appears to be somewhat flared up regarding her upper airway and her hands and we will treat her with a  very low dose of systemic steroids as noted above.  I will see her back in this clinic for skin testing.  Allena Katz, MD Allergy / Immunology [facility]

## 2018-02-12 ENCOUNTER — Encounter: Payer: Self-pay | Admitting: Allergy and Immunology

## 2018-02-18 ENCOUNTER — Ambulatory Visit (INDEPENDENT_AMBULATORY_CARE_PROVIDER_SITE_OTHER): Payer: BC Managed Care – PPO | Admitting: Physician Assistant

## 2018-02-18 VITALS — BP 133/83 | HR 83 | Temp 98.9°F | Ht 70.0 in | Wt 300.0 lb

## 2018-02-18 DIAGNOSIS — E559 Vitamin D deficiency, unspecified: Secondary | ICD-10-CM | POA: Diagnosis not present

## 2018-02-18 DIAGNOSIS — R7303 Prediabetes: Secondary | ICD-10-CM | POA: Diagnosis not present

## 2018-02-18 DIAGNOSIS — E66813 Obesity, class 3: Secondary | ICD-10-CM

## 2018-02-18 DIAGNOSIS — Z9189 Other specified personal risk factors, not elsewhere classified: Secondary | ICD-10-CM | POA: Diagnosis not present

## 2018-02-18 DIAGNOSIS — Z6841 Body Mass Index (BMI) 40.0 and over, adult: Secondary | ICD-10-CM | POA: Diagnosis not present

## 2018-02-18 MED ORDER — VITAMIN D (ERGOCALCIFEROL) 1.25 MG (50000 UNIT) PO CAPS: 50000.0000 [IU] | ORAL_CAPSULE | ORAL | 0 refills | Status: DC

## 2018-02-18 NOTE — Progress Notes (Signed)
Office: [phone]  /  Fax: [phone]   HPI:   Chief Complaint: OBESITY Brandi Lester is here to discuss her progress with her obesity treatment plan. She is on the Category 3 plan and is following her eating plan approximately 80 % of the time. She states she is walking 25 to 30 minutes 3 to 4 times per week. Brandi Lester continues to do well with weight loss. She is following the structured meal plan and states hunger is well controlled. Her weight is 300 lb (136.1 kg) today and has had a weight loss of 3 pounds over a period of 2 weeks since her last visit. She has lost 25 lbs since starting treatment with [name].  Vitamin D deficiency Brandi Lester has a diagnosis of vitamin D deficiency. She is currently taking vit D and denies nausea, vomiting or muscle weakness.  At risk for osteopenia and osteoporosis Brandi Lester is at higher risk of osteopenia and osteoporosis due to vitamin D deficiency.   Pre-Diabetes Brandi Lester has a diagnosis of prediabetes based on her elevated Hgb A1c and was informed this puts her at greater risk of developing diabetes. She is on metformin and continues to work on diet and exercise to decrease risk of diabetes. She denies nausea, polyphagia or hypoglycemia.  ALLERGIES: Allergies  Allergen Reactions  . Penicillins Itching and Swelling    Has patient had a PCN reaction causing immediate rash, facial/tongue/throat swelling, SOB or lightheadedness with hypotension: yes Has patient had a PCN reaction causing severe rash involving mucus membranes or skin necrosis: no Has patient had a PCN reaction that required hospitalization no Has patient had a PCN reaction occurring within the last 10 years: yes If all of the above answers are "NO", then may proceed with Cephalosporin use.   Brandi Lester [Liraglutide]     Vomiting    MEDICATIONS: Current Outpatient Medications on File Prior to Visit  Medication Sig Dispense Refill  . acetaminophen (TYLENOL 8 HOUR) 650 MG CR tablet Take 1  tablet (650 mg total) every 8 (eight) hours as needed by mouth for pain.    [name] amLODipine (NORVASC) 5 MG tablet Take 1 tablet (5 mg total) by mouth daily. 90 tablet 1  . aspirin EC 81 MG tablet Take 1 tablet (81 mg total) by mouth daily.    [name] azelastine (ASTELIN) 0.1 % nasal spray Place 2 sprays into both nostrils 2 (two) times daily. Use in each nostril as directed 90 mL 1  . Cyanocobalamin (B-12) 1000 MCG CAPS Take by mouth daily.    [name] EPINEPHrine (EPIPEN 2-PAK) 0.3 mg/0.3 mL IJ SOAJ injection Use as directed for severe allergic reaction 2 Device 1  . fexofenadine (ALLEGRA ALLERGY) 180 MG tablet Take 1 tablet (180 mg total) by mouth daily. 301 tablet 1  . fluticasone (FLONASE) 50 MCG/ACT nasal spray Place 2 sprays into both nostrils daily. (Patient taking differently: Place 2 sprays into both nostrils daily as needed for allergies. ) 16 g 11  . glucose blood test strip OneTouch Ultra Test strips    . ibuprofen (ADVIL,MOTRIN) 600 MG tablet Take 1 tablet (600 mg total) by mouth every 6 (six) hours as needed. 90 tablet 0  . losartan-hydrochlorothiazide (HYZAAR) 100-25 MG tablet TAKE 1 TABLET BY MOUTH DAILY. 90 tablet 1  . metFORMIN (GLUCOPHAGE) 500 MG tablet TAKE 1 TABLET (500 MG TOTAL) BY MOUTH DAILY WITH BREAKFAST. 90 tablet 1  . montelukast (SINGULAIR) 10 MG tablet Take 1 tablet (10 mg total) by mouth at bedtime. 90 tablet  1  . Omega-3 Fatty Acids (FISH OIL PO) Take 1 capsule by mouth daily.    [name] DELICA LANCETS 09W MISC OneTouch Delica Lancets 33 gauge    . pantoprazole (PROTONIX) 40 MG tablet Take 1 tablet (40 mg total) by mouth daily. 90 tablet 1  . potassium chloride SA (K-DUR,KLOR-CON) 20 MEQ tablet Take 1 tablet (20 mEq total) by mouth 3 (three) times daily. 120 tablet 3  . Probiotic Product (PROBIOTIC-10 PO) Take by mouth daily.    . TURMERIC PO Take 1 capsule by mouth daily.    . Vitamin D, Ergocalciferol, (DRISDOL) 50000 units CAPS capsule Take 1 capsule (50,000 Units total) by  mouth every 7 (seven) days. 4 capsule 0   No current facility-administered medications on file prior to visit.     PAST MEDICAL HISTORY: Past Medical History:  Diagnosis Date  . ADD (attention deficit disorder)   . Allergy    allergic rhinitis  . Anemia 07/25/2013  . Angio-edema   . Back pain   . Costochondritis 02/20/2015  . Diabetes mellitus    Type II   previously 3 years ago - no longer on meds   . Dyslipidemia 07/25/2013  . Fungus infection 10/13   Left great toe  . GERD (gastroesophageal reflux disease)   . Hypertension   . IBS (irritable bowel syndrome)   . Ingrown nail 10/13   right foot next to the last toe  . Insomnia 04/20/2013  . Joint pain   . Lactose intolerance   . Leg edema   . Obesity   . Pedal edema 02/20/2015  . Prediabetes   . Preventative health care 09/25/2015  . PVC (premature ventricular contraction)     PAST SURGICAL HISTORY: Past Surgical History:  Procedure Laterality Date  . HYSTEROSCOPY N/A 10/04/2015   Procedure: HYSTEROSCOPY with Removal IUD;  Surgeon: Vanessa Kick, MD;  Location: [facility];  Service: Gynecology;  Laterality: N/A;  . lasik     eye surgery  . WISDOM TOOTH EXTRACTION      SOCIAL HISTORY: Social History   Tobacco Use  . Smoking status: Former Smoker    Packs/day: 1.00    Years: 15.00    Pack years: 15.00    Last attempt to quit: 09/03/2001    Years since quitting: 16.4  . Smokeless tobacco: Never Used  Substance Use Topics  . Alcohol use: Yes    Comment: rare  . Drug use: No    FAMILY HISTORY: Family History  Problem Relation Age of Onset  . Heart attack Mother 34  . Hypertension Mother   . Heart disease Mother   . Obesity Mother   . Cancer Father        prostate  . Heart attack Sister 34  . Hypertension Other     ROS: Review of Systems  Constitutional: Positive for weight loss.  Gastrointestinal: Negative for nausea and vomiting.  Musculoskeletal:       Negative for muscle weakness    Endo/Heme/Allergies:       Negative for polyphagia Negative for hypoglycemia    PHYSICAL EXAM: Blood pressure 133/83, pulse 83, temperature 98.9 F (37.2 C), temperature source Oral, height 5\' 10"  (1.778 m), weight 300 lb (136.1 kg), SpO2 98 %. Body mass index is 43.05 kg/m. Physical Exam  Constitutional: She is oriented to person, place, and time. She appears well-developed and well-nourished.  Cardiovascular: Normal rate.  Pulmonary/Chest: Effort normal.  Musculoskeletal: Normal range of motion.  Neurological: She is oriented  to person, place, and time.  Skin: Skin is warm and dry.  Psychiatric: She has a normal mood and affect. Her behavior is normal.  Vitals reviewed.   RECENT LABS AND TESTS: BMET    Component Value Date/Time   NA 137 09/12/2017 0927   K 3.6 09/12/2017 0927   CL 101 09/12/2017 0927   CO2 25 09/12/2017 0927   GLUCOSE 85 09/12/2017 0927   GLUCOSE 107 (H) 03/15/2017 1140   BUN 10 09/12/2017 0927   CREATININE 0.65 09/12/2017 0927   CREATININE 0.72 02/03/2014 1648   CALCIUM 9.0 09/12/2017 0927   GFRNONAA 105 09/12/2017 0927   GFRAA 121 09/12/2017 0927   Lab Results  Component Value Date   HGBA1C 5.8 (H) 09/12/2017   HGBA1C 6.2 (H) 05/23/2017   HGBA1C 6.6 (H) 03/15/2017   HGBA1C 6.3 11/06/2016   HGBA1C 6.2 07/18/2016   Lab Results  Component Value Date   INSULIN 12.9 09/12/2017   INSULIN 31.1 (H) 05/23/2017   CBC    Component Value Date/Time   WBC 7.0 05/23/2017 1025   WBC 6.7 03/15/2017 1140   RBC 4.46 05/23/2017 1025   RBC 4.49 03/15/2017 1140   HGB 12.1 05/23/2017 1025   HCT 36.6 05/23/2017 1025   PLT 273.0 03/15/2017 1140   MCV 82 05/23/2017 1025   MCH 27.1 05/23/2017 1025   MCH 28.2 02/03/2014 1648   MCHC 33.1 05/23/2017 1025   MCHC 34.0 03/15/2017 1140   RDW 14.9 05/23/2017 1025   LYMPHSABS 1.7 05/23/2017 1025   MONOABS 0.4 06/09/2014 0843   EOSABS 0.1 05/23/2017 1025   BASOSABS 0.0 05/23/2017 1025   Iron/TIBC/Ferritin/  %Sat No results found for: IRON, TIBC, FERRITIN, IRONPCTSAT Lipid Panel     Component Value Date/Time   CHOL 132 09/12/2017 0927   TRIG 93 09/12/2017 0927   HDL 32 (L) 09/12/2017 0927   CHOLHDL 4 03/15/2017 1140   VLDL 26.6 03/15/2017 1140   LDLCALC 81 09/12/2017 0927   Hepatic Function Panel     Component Value Date/Time   PROT 6.9 09/12/2017 0927   ALBUMIN 4.1 09/12/2017 0927   AST 8 09/12/2017 0927   ALT 9 09/12/2017 0927   ALKPHOS 78 09/12/2017 0927   BILITOT 0.2 09/12/2017 0927   BILIDIR 0.0 06/09/2014 0843   IBILI 0.1 (L) 02/03/2014 1648      Component Value Date/Time   TSH 1.710 05/23/2017 1025   TSH 1.26 03/15/2017 1140   TSH 1.68 11/06/2016 1438   Results for DILANA, MCPHIE (MRN [id number]) as of 02/18/2018 16:10  Ref. Range 09/12/2017 09:27  Vitamin D, 25-Hydroxy Latest Ref Range: 30.0 - 100.0 ng/mL 27.9 (L)   ASSESSMENT AND PLAN: Vitamin D deficiency - Plan: Vitamin D, Ergocalciferol, (DRISDOL) 50000 units CAPS capsule  Prediabetes  At risk for osteoporosis  Class 3 severe obesity with serious comorbidity and body mass index (BMI) of 40.0 to 44.9 in adult, unspecified obesity type ([place])  PLAN:  Vitamin D Deficiency Brandi Lester was informed that low vitamin D levels contributes to fatigue and are associated with obesity, breast, and colon cancer. She agrees to continue to take prescription Vit D @50 ,000 IU every week #4 with no refills and will follow up for routine testing of vitamin D, at least 2-3 times per year. She was informed of the risk of over-replacement of vitamin D and agrees to not increase her dose unless she discusses this with [name] first. Brandi Lester agrees to follow up as directed.  At risk for  osteopenia and osteoporosis Brandi Lester is at risk for osteopenia and osteoporosis due to her vitamin D deficiency. She was encouraged to take her vitamin D and follow her higher calcium diet and increase strengthening exercise to help strengthen her bones and  decrease her risk of osteopenia and osteoporosis.  Pre-Diabetes Brandi Lester will continue to work on weight loss, exercise, and decreasing simple carbohydrates in her diet to help decrease the risk of diabetes. We dicussed metformin including benefits and risks. She was informed that eating too many simple carbohydrates or too many calories at one sitting increases the likelihood of GI side effects. Berta will continue metformin and follow up with [name] as directed to monitor her progress.  Obesity Brandi Lester is currently in the action stage of change. As such, her goal is to continue with weight loss efforts She has agreed to follow the Category 3 plan Brandi Lester has been instructed to work up to a goal of 150 minutes of combined cardio and strengthening exercise per week for weight loss and overall health benefits. We discussed the following Behavioral Modification Strategies today: increasing lean protein intake and work on meal planning and easy cooking plans  Brandi Lester has agreed to follow up with our clinic in 2 weeks. She was informed of the importance of frequent follow up visits to maximize her success with intensive lifestyle modifications for her multiple health conditions.   OBESITY BEHAVIORAL INTERVENTION VISIT  Today's visit was # 17 out of 22.  Starting weight: 325 lbs Starting date: 05/23/17 Today's weight : 300 lbs Today's date: 02/18/2018 Total lbs lost to date: 25 (Patients must lose 7 lbs in the first 6 months to continue with counseling)   ASK: We discussed the diagnosis of obesity with Brandi Lester today and Brandi Lester agreed to give [name] permission to discuss obesity behavioral modification therapy today.  ASSESS: Brandi Lester has the diagnosis of obesity and her BMI today is 43.05 Brandi Lester is in the action stage of change   ADVISE: Brandi Lester was educated on the multiple health risks of obesity as well as the benefit of weight loss to improve her health. She was advised of the need  for long term treatment and the importance of lifestyle modifications.  AGREE: Multiple dietary modification options and treatment options were discussed and  Brandi Lester agreed to the above obesity treatment plan.   Corey Skains, am acting as transcriptionist for [employer], PA-C I, [facility], have reviewed this note and agree with its content

## 2018-02-24 ENCOUNTER — Other Ambulatory Visit: Payer: Self-pay | Admitting: Family Medicine

## 2018-02-24 MED FILL — LOSARTAN-HCTZ 100-25 MG TAB: 100-25 | 90 days supply | Qty: 90 | Fill #0

## 2018-02-24 MED FILL — metFORMIN HCL 500 MG TABS: 500 | 90 days supply | Qty: 90 | Fill #0

## 2018-02-24 MED FILL — POTASSIUM CL ER 20 MEQ TAB: 20 | 30 days supply | Qty: 120 | Fill #3

## 2018-02-24 MED FILL — MEDROXYPROGESTERONE 10 MG T: 10 | 30 days supply | Qty: 30 | Fill #2

## 2018-02-24 MED FILL — PANTOPRAZOLE SOD DR 40 MG T: 40 | 90 days supply | Qty: 90 | Fill #1

## 2018-02-25 MED FILL — VIT D2 1.25 MG (50,000 UNIT: 1.25 MG | 28 days supply | Qty: 4 | Fill #0

## 2018-03-04 ENCOUNTER — Ambulatory Visit (INDEPENDENT_AMBULATORY_CARE_PROVIDER_SITE_OTHER): Payer: BC Managed Care – PPO | Admitting: Physician Assistant

## 2018-03-04 VITALS — BP 122/78 | HR 91 | Temp 98.8°F | Ht 70.0 in | Wt 305.0 lb

## 2018-03-04 DIAGNOSIS — Z6841 Body Mass Index (BMI) 40.0 and over, adult: Secondary | ICD-10-CM

## 2018-03-04 DIAGNOSIS — E559 Vitamin D deficiency, unspecified: Secondary | ICD-10-CM

## 2018-03-04 NOTE — Progress Notes (Signed)
Office: [phone]  /  Fax: [phone]   HPI:   Chief Complaint: OBESITY Brandi Lester is here to discuss her progress with her obesity treatment plan. She is on the Category 3 plan and is following her eating plan approximately 80-85 % of the time. She states she is walking for 35 minutes 5 times per week. Brandi Lester makes mindful food choices and states she has been decreasing her simple carbohydrates. She has not kept up with her water intake.  Her weight is (!) 305 lb (138.3 kg) today and has gained 5 pounds since her last visit. She has lost 20 lbs since starting treatment with [name].  Vitamin D Deficiency Brandi Lester has a diagnosis of vitamin D deficiency. She is currently taking prescription Vit D and denies nausea, vomiting or muscle weakness.  ALLERGIES: Allergies  Allergen Reactions  . Penicillins Itching and Swelling    Has patient had a PCN reaction causing immediate rash, facial/tongue/throat swelling, SOB or lightheadedness with hypotension: yes Has patient had a PCN reaction causing severe rash involving mucus membranes or skin necrosis: no Has patient had a PCN reaction that required hospitalization no Has patient had a PCN reaction occurring within the last 10 years: yes If all of the above answers are "NO", then may proceed with Cephalosporin use.   Brandi Lester [Liraglutide]     Vomiting    MEDICATIONS: Current Outpatient Medications on File Prior to Visit  Medication Sig Dispense Refill  . acetaminophen (TYLENOL 8 HOUR) 650 MG CR tablet Take 1 tablet (650 mg total) every 8 (eight) hours as needed by mouth for pain.    [name] amLODipine (NORVASC) 5 MG tablet Take 1 tablet (5 mg total) by mouth daily. 90 tablet 1  . aspirin EC 81 MG tablet Take 1 tablet (81 mg total) by mouth daily.    [name] azelastine (ASTELIN) 0.1 % nasal spray Place 2 sprays into both nostrils 2 (two) times daily. Use in each nostril as directed 90 mL 1  . Cyanocobalamin (B-12) 1000 MCG CAPS Take by mouth daily.     [name] EPINEPHrine (EPIPEN 2-PAK) 0.3 mg/0.3 mL IJ SOAJ injection Use as directed for severe allergic reaction 2 Device 1  . fexofenadine (ALLEGRA ALLERGY) 180 MG tablet Take 1 tablet (180 mg total) by mouth daily. 301 tablet 1  . fluticasone (FLONASE) 50 MCG/ACT nasal spray Place 2 sprays into both nostrils daily. (Patient taking differently: Place 2 sprays into both nostrils daily as needed for allergies. ) 16 g 11  . glucose blood test strip OneTouch Ultra Test strips    . ibuprofen (ADVIL,MOTRIN) 600 MG tablet Take 1 tablet (600 mg total) by mouth every 6 (six) hours as needed. 90 tablet 0  . losartan-hydrochlorothiazide (HYZAAR) 100-25 MG tablet TAKE 1 TABLET BY MOUTH DAILY. 90 tablet 1  . metFORMIN (GLUCOPHAGE) 500 MG tablet TAKE 1 TABLET (500 MG TOTAL) BY MOUTH DAILY WITH BREAKFAST. 90 tablet 1  . montelukast (SINGULAIR) 10 MG tablet Take 1 tablet (10 mg total) by mouth at bedtime. 90 tablet 1  . Omega-3 Fatty Acids (FISH OIL PO) Take 1 capsule by mouth daily.    Brandi Lester OneTouch Delica Lancets 33 gauge    . pantoprazole (PROTONIX) 40 MG tablet Take 1 tablet (40 mg total) by mouth daily. 90 tablet 1  . potassium chloride SA (K-DUR,KLOR-CON) 20 MEQ tablet Take 1 tablet (20 mEq total) by mouth 3 (three) times daily. 120 tablet 3  . Probiotic Product (PROBIOTIC-10  PO) Take by mouth daily.    . TURMERIC PO Take 1 capsule by mouth daily.    . Vitamin D, Ergocalciferol, (DRISDOL) 50000 units CAPS capsule Take 1 capsule (50,000 Units total) by mouth every 7 (seven) days. 4 capsule 0   No current facility-administered medications on file prior to visit.     PAST MEDICAL HISTORY: Past Medical History:  Diagnosis Date  . ADD (attention deficit disorder)   . Allergy    allergic rhinitis  . Anemia 07/25/2013  . Angio-edema   . Back pain   . Costochondritis 02/20/2015  . Diabetes mellitus    Type II   previously 3 years ago - no longer on meds   . Dyslipidemia  07/25/2013  . Fungus infection 10/13   Left great toe  . GERD (gastroesophageal reflux disease)   . Hypertension   . IBS (irritable bowel syndrome)   . Ingrown nail 10/13   right foot next to the last toe  . Insomnia 04/20/2013  . Joint pain   . Lactose intolerance   . Leg edema   . Obesity   . Pedal edema 02/20/2015  . Prediabetes   . Preventative health care 09/25/2015  . PVC (premature ventricular contraction)     PAST SURGICAL HISTORY: Past Surgical History:  Procedure Laterality Date  . HYSTEROSCOPY N/A 10/04/2015   Procedure: HYSTEROSCOPY with Removal IUD;  Surgeon: Vanessa Kick, MD;  Location: [facility];  Service: Gynecology;  Laterality: N/A;  . lasik     eye surgery  . WISDOM TOOTH EXTRACTION      SOCIAL HISTORY: Social History   Tobacco Use  . Smoking status: Former Smoker    Packs/day: 1.00    Years: 15.00    Pack years: 15.00    Last attempt to quit: 09/03/2001    Years since quitting: 16.5  . Smokeless tobacco: Never Used  Substance Use Topics  . Alcohol use: Yes    Comment: rare  . Drug use: No    FAMILY HISTORY: Family History  Problem Relation Age of Onset  . Heart attack Mother 51  . Hypertension Mother   . Heart disease Mother   . Obesity Mother   . Cancer Father        prostate  . Heart attack Sister 48  . Hypertension Other     ROS: Review of Systems  Constitutional: Positive for weight loss.  Gastrointestinal: Negative for nausea and vomiting.  Musculoskeletal:       Negative muscle weakness    PHYSICAL EXAM: Blood pressure 122/78, pulse 91, temperature 98.8 F (37.1 Brandi Lester), temperature source Oral, height 5\' 10"  (1.778 m), weight (!) 305 lb (138.3 kg), SpO2 99 %. Body mass index is 43.76 kg/m. Physical Exam  Constitutional: She is oriented to person, place, and time. She appears well-developed and well-nourished.  Cardiovascular: Normal rate.  Pulmonary/Chest: Effort normal.  Musculoskeletal: Normal range of motion.  Neurological:  She is oriented to person, place, and time.  Skin: Skin is warm and dry.  Psychiatric: She has a normal mood and affect. Her behavior is normal.  Vitals reviewed.   RECENT LABS AND TESTS: BMET    Component Value Date/Time   NA 137 09/12/2017 0927   K 3.6 09/12/2017 0927   CL 101 09/12/2017 0927   CO2 25 09/12/2017 0927   GLUCOSE 85 09/12/2017 0927   GLUCOSE 107 (H) 03/15/2017 1140   BUN 10 09/12/2017 0927   CREATININE 0.65 09/12/2017 0927   CREATININE 0.72  02/03/2014 1648   CALCIUM 9.0 09/12/2017 0927   GFRNONAA 105 09/12/2017 0927   GFRAA 121 09/12/2017 0927   Lab Results  Component Value Date   HGBA1C 5.8 (H) 09/12/2017   HGBA1C 6.2 (H) 05/23/2017   HGBA1C 6.6 (H) 03/15/2017   HGBA1C 6.3 11/06/2016   HGBA1C 6.2 07/18/2016   Lab Results  Component Value Date   INSULIN 12.9 09/12/2017   INSULIN 31.1 (H) 05/23/2017   CBC    Component Value Date/Time   WBC 7.0 05/23/2017 1025   WBC 6.7 03/15/2017 1140   RBC 4.46 05/23/2017 1025   RBC 4.49 03/15/2017 1140   HGB 12.1 05/23/2017 1025   HCT 36.6 05/23/2017 1025   PLT 273.0 03/15/2017 1140   MCV 82 05/23/2017 1025   MCH 27.1 05/23/2017 1025   MCH 28.2 02/03/2014 1648   MCHC 33.1 05/23/2017 1025   MCHC 34.0 03/15/2017 1140   RDW 14.9 05/23/2017 1025   LYMPHSABS 1.7 05/23/2017 1025   MONOABS 0.4 06/09/2014 0843   EOSABS 0.1 05/23/2017 1025   BASOSABS 0.0 05/23/2017 1025   Iron/TIBC/Ferritin/ %Sat No results found for: IRON, TIBC, FERRITIN, IRONPCTSAT Lipid Panel     Component Value Date/Time   CHOL 132 09/12/2017 0927   TRIG 93 09/12/2017 0927   HDL 32 (L) 09/12/2017 0927   CHOLHDL 4 03/15/2017 1140   VLDL 26.6 03/15/2017 1140   LDLCALC 81 09/12/2017 0927   Hepatic Function Panel     Component Value Date/Time   PROT 6.9 09/12/2017 0927   ALBUMIN 4.1 09/12/2017 0927   AST 8 09/12/2017 0927   ALT 9 09/12/2017 0927   ALKPHOS 78 09/12/2017 0927   BILITOT 0.2 09/12/2017 0927   BILIDIR 0.0 06/09/2014  0843   IBILI 0.1 (L) 02/03/2014 1648      Component Value Date/Time   TSH 1.710 05/23/2017 1025   TSH 1.26 03/15/2017 1140   TSH 1.68 11/06/2016 1438  Results for BITHA, FAUTEUX (MRN [id number]) as of 03/04/2018 16:39  Ref. Range 09/12/2017 09:27  Vitamin D, 25-Hydroxy Latest Ref Range: 30.0 - 100.0 ng/mL 27.9 (L)    ASSESSMENT AND PLAN: Vitamin D deficiency  Class 3 severe obesity with serious comorbidity and body mass index (BMI) of 40.0 to 44.9 in adult, unspecified obesity type (HCC)  PLAN:  Vitamin D Deficiency Brandi Lester was informed that low vitamin D levels contributes to fatigue and are associated with obesity, breast, and colon cancer. Brandi Lester to continue taking prescription Vit D @50 ,000 IU every week and will follow up for routine testing of vitamin D, at least 2-3 times per year. She was informed of the risk of over-replacement of vitamin D and Lester to not increase her dose unless she discusses this with [name] first. Brandi Lester Lester to follow up with our clinic in 2 weeks.  We spent > than 50% of the 15 minute visit on the counseling as documented in the note.  Obesity Brandi Lester is currently in the action stage of change. As such, her goal is to continue with weight loss efforts She has agreed to follow the Category 3 plan Brandi Lester has been instructed to work up to a goal of 150 minutes of combined cardio and strengthening exercise per week for weight loss and overall health benefits. We discussed the following Behavioral Modification Strategies today: increase H20 intake and planning for success   Brandi Lester has agreed to follow up with our clinic in 2 weeks. She was informed of the importance of frequent follow  up visits to maximize her success with intensive lifestyle modifications for her multiple health conditions.   OBESITY BEHAVIORAL INTERVENTION VISIT  Today's visit was # 18 out of 22.  Starting weight: 325 lbs Starting date: 05/23/17 Today's weight : 305  lbs Today's date: 03/04/2018 Total lbs lost to date: 20 (Patients must lose 7 lbs in the first 6 months to continue with counseling)   ASK: We discussed the diagnosis of obesity with Brandi Lester today and Brandi Lester agreed to give [name] permission to discuss obesity behavioral modification therapy today.  ASSESS: Breiona has the diagnosis of obesity and her BMI today is 43.76 Chera is in the action stage of change   ADVISE: Maliea was educated on the multiple health risks of obesity as well as the benefit of weight loss to improve her health. She was advised of the need for long term treatment and the importance of lifestyle modifications.  AGREE: Multiple dietary modification options and treatment options were discussed and  Ailani agreed to the above obesity treatment plan.   Wilhemena Durie, am acting as transcriptionist for Lacy Duverney, PA-Brandi Lester I, Lacy Duverney [facility], have reviewed this note and agree with its content

## 2018-03-13 ENCOUNTER — Encounter: Payer: Self-pay | Admitting: Family Medicine

## 2018-03-13 ENCOUNTER — Ambulatory Visit: Payer: BC Managed Care – PPO | Admitting: Family Medicine

## 2018-03-13 DIAGNOSIS — E559 Vitamin D deficiency, unspecified: Secondary | ICD-10-CM | POA: Diagnosis not present

## 2018-03-13 DIAGNOSIS — Z0000 Encounter for general adult medical examination without abnormal findings: Secondary | ICD-10-CM | POA: Diagnosis not present

## 2018-03-13 DIAGNOSIS — E119 Type 2 diabetes mellitus without complications: Secondary | ICD-10-CM | POA: Diagnosis not present

## 2018-03-13 DIAGNOSIS — E785 Hyperlipidemia, unspecified: Secondary | ICD-10-CM

## 2018-03-13 DIAGNOSIS — E6609 Other obesity due to excess calories: Secondary | ICD-10-CM

## 2018-03-13 DIAGNOSIS — D649 Anemia, unspecified: Secondary | ICD-10-CM | POA: Diagnosis not present

## 2018-03-13 DIAGNOSIS — E781 Pure hyperglyceridemia: Secondary | ICD-10-CM | POA: Diagnosis not present

## 2018-03-13 DIAGNOSIS — I10 Essential (primary) hypertension: Secondary | ICD-10-CM | POA: Diagnosis not present

## 2018-03-13 LAB — CBC
HEMATOCRIT: 35.9 % — AB (ref 36.0–46.0)
Hemoglobin: 11.9 g/dL — ABNORMAL LOW (ref 12.0–15.0)
MCHC: 33.2 g/dL (ref 30.0–36.0)
MCV: 82.4 fl (ref 78.0–100.0)
PLATELETS: 283 10*3/uL (ref 150.0–400.0)
RBC: 4.35 Mil/uL (ref 3.87–5.11)
RDW: 15 % (ref 11.5–15.5)
WBC: 5.9 10*3/uL (ref 4.0–10.5)

## 2018-03-13 LAB — LIPID PANEL
Cholesterol: 140 mg/dL (ref 0–200)
HDL: 40 mg/dL (ref >39.00)
LDL Cholesterol: 86 mg/dL (ref 0–99)
NONHDL: 99.69
Total CHOL/HDL Ratio: 3
Triglycerides: 66 mg/dL (ref 0.0–149.0)
VLDL: 13.2 mg/dL (ref 0.0–40.0)

## 2018-03-13 LAB — COMPREHENSIVE METABOLIC PANEL
ALBUMIN: 4 g/dL (ref 3.5–5.2)
ALT: 9 U/L (ref 0–35)
AST: 9 U/L (ref 0–37)
Alkaline Phosphatase: 67 U/L (ref 39–117)
BUN: 9 mg/dL (ref 6–23)
CALCIUM: 8.9 mg/dL (ref 8.4–10.5)
CHLORIDE: 103 meq/L (ref 96–112)
CO2: 29 mEq/L (ref 19–32)
CREATININE: 0.6 mg/dL (ref 0.40–1.20)
GFR: 136.18 mL/min (ref >60.00)
Glucose, Bld: 86 mg/dL (ref 70–99)
Potassium: 3.7 mEq/L (ref 3.5–5.1)
Sodium: 139 mEq/L (ref 135–145)
Total Bilirubin: 0.3 mg/dL (ref 0.2–1.2)
Total Protein: 7 g/dL (ref 6.0–8.3)

## 2018-03-13 LAB — VITAMIN D 25 HYDROXY (VIT D DEFICIENCY, FRACTURES): VITD: 25.85 ng/mL — ABNORMAL LOW (ref 30.00–100.00)

## 2018-03-13 LAB — TSH: TSH: 2.1 u[IU]/mL (ref 0.35–4.50)

## 2018-03-13 LAB — HEMOGLOBIN A1C: Hgb A1c MFr Bld: 5.9 % (ref 4.6–6.5)

## 2018-03-13 MED ORDER — MONTELUKAST SODIUM 10 MG PO TABS: 10.0000 mg | ORAL_TABLET | Freq: Every evening | ORAL | 1 refills | Status: DC | PRN

## 2018-03-13 MED FILL — MONTELUKAST SOD 10 MG TAB: 10 | 90 days supply | Qty: 90 | Fill #0

## 2018-03-13 NOTE — Assessment & Plan Note (Signed)
Patient encouraged to maintain heart healthy diet, regular exercise, adequate sleep. Consider daily probiotics. Take medications as prescribed. Given and reviewed copy of ACP documents from [place] Secretary of State and encouraged to complete and return 

## 2018-03-13 NOTE — Assessment & Plan Note (Signed)
Supplement and monitor 

## 2018-03-13 NOTE — Patient Instructions (Addendum)
For a bad allergic rash in the future. Increase Cetirizine/Zyrtec 10 mg tabs to twice daily (Benadryl is also an antihistamine and you cannot take more than 3 doses of this class in 24 hours) Ao Zantac/Ranitidine 150 mg tabs twice daily during an eruption Lidocaine gel as needed Hyland's leg cramp medicine as needed Hydrate 64 of clear fluids   shingrix is the new shingles shot, 2 shots over 2-6 months after you turn 50 and check with insurance regarding payment  Preventive Care 40-64 Years, Female Preventive care refers to lifestyle choices and visits with your health care provider that can promote health and wellness. What does preventive care include?  A yearly physical exam. This is also called an annual well check.  Dental exams once or twice a year.  Routine eye exams. Ask your health care provider how often you should have your eyes checked.  Personal lifestyle choices, including: ? Daily care of your teeth and gums. ? Regular physical activity. ? Eating a healthy diet. ? Avoiding tobacco and drug use. ? Limiting alcohol use. ? Practicing safe sex. ? Taking low-dose aspirin daily starting at age 1. ? Taking vitamin and mineral supplements as recommended by your health care provider. What happens during an annual well check? The services and screenings done by your health care provider during your annual well check will depend on your age, overall health, lifestyle risk factors, and family history of disease. Counseling Your health care provider may ask you questions about your:  Alcohol use.  Tobacco use.  Drug use.  Emotional well-being.  Home and relationship well-being.  Sexual activity.  Eating habits.  Work and work Statistician.  Method of birth control.  Menstrual cycle.  Pregnancy history.  Screening You may have the following tests or measurements:  Height, weight, and BMI.  Blood pressure.  Lipid and cholesterol levels. These may be  checked every 5 years, or more frequently if you are over 23 years old.  Skin check.  Lung cancer screening. You may have this screening every year starting at age 46 if you have a 30-pack-year history of smoking and currently smoke or have quit within the past 15 years.  Fecal occult blood test (FOBT) of the stool. You may have this test every year starting at age 71.  Flexible sigmoidoscopy or colonoscopy. You may have a sigmoidoscopy every 5 years or a colonoscopy every 10 years starting at age 49.  Hepatitis C blood test.  Hepatitis B blood test.  Sexually transmitted disease (STD) testing.  Diabetes screening. This is done by checking your blood sugar (glucose) after you have not eaten for a while (fasting). You may have this done every 1-3 years.  Mammogram. This may be done every 1-2 years. Talk to your health care provider about when you should start having regular mammograms. This may depend on whether you have a family history of breast cancer.  BRCA-related cancer screening. This may be done if you have a family history of breast, ovarian, tubal, or peritoneal cancers.  Pelvic exam and Pap test. This may be done every 3 years starting at age 45. Starting at age 37, this may be done every 5 years if you have a Pap test in combination with an HPV test.  Bone density scan. This is done to screen for osteoporosis. You may have this scan if you are at high risk for osteoporosis.  Discuss your test results, treatment options, and if necessary, the need for more tests with your  health care provider. Vaccines Your health care provider may recommend certain vaccines, such as:  Influenza vaccine. This is recommended every year.  Tetanus, diphtheria, and acellular pertussis (Tdap, Td) vaccine. You may need a Td booster every 10 years.  Varicella vaccine. You may need this if you have not been vaccinated.  Zoster vaccine. You may need this after age 42.  Measles, mumps, and  rubella (MMR) vaccine. You may need at least one dose of MMR if you were born in 1957 or later. You may also need a second dose.  Pneumococcal 13-valent conjugate (PCV13) vaccine. You may need this if you have certain conditions and were not previously vaccinated.  Pneumococcal polysaccharide (PPSV23) vaccine. You may need one or two doses if you smoke cigarettes or if you have certain conditions.  Meningococcal vaccine. You may need this if you have certain conditions.  Hepatitis A vaccine. You may need this if you have certain conditions or if you travel or work in places where you may be exposed to hepatitis A.  Hepatitis B vaccine. You may need this if you have certain conditions or if you travel or work in places where you may be exposed to hepatitis B.  Haemophilus influenzae type b (Hib) vaccine. You may need this if you have certain conditions.  Talk to your health care provider about which screenings and vaccines you need and how often you need them. This information is not intended to replace advice given to you by your health care provider. Make sure you discuss any questions you have with your health care provider. Document Released: 09/16/2015 Document Revised: 05/09/2016 Document Reviewed: 06/21/2015 Elsevier Interactive Patient Education  [employer].

## 2018-03-13 NOTE — Assessment & Plan Note (Signed)
hgba1c acceptable, minimize simple carbs. Increase exercise as tolerated.  

## 2018-03-13 NOTE — Assessment & Plan Note (Signed)
Increase leafy greens, consider increased lean red meat and using cast iron cookware. Continue to monitor, report any concerns 

## 2018-03-13 NOTE — Assessment & Plan Note (Signed)
Encouraged heart healthy diet, increase exercise, avoid trans fats, consider a krill oil cap daily 

## 2018-03-13 NOTE — Assessment & Plan Note (Signed)
Encouraged DASH diet, decrease po intake and increase exercise as tolerated. Needs 7-8 hours of sleep nightly. Avoid trans fats, eat small, frequent meals every 4-5 hours with lean proteins, complex carbs and healthy fats. Minimize simple carbs, following with bariatric program

## 2018-03-13 NOTE — Progress Notes (Signed)
Subjective:  I acted as a Education administrator for Dr. Charlett Blake. [place], [place]  Patient ID: Brandi Lester, female    DOB: Aug 02, 1966, 52 y.o.   MRN: [id number]  Chief Complaint  Patient presents with  . Annual Exam    HPI  Patient is in today for an annual exam. She is following up on her HTN, DM, and other medical concerns. She feels well today but she has been noting a rash recently and swelling in her eyes as well as nasal congestion. A round of Prednisone was helpful. Notes some left ear pressure as well. No fevers or chills. Denies CP/palp/SOB/HA/congestion/fevers/GI or GU c/o. Taking meds as prescribed. She is doing well with staying active and activities of daily living.  Patient Care Team: Mosie Lukes, MD as PCP - General (Family Medicine) Neldon Mc, Donnamarie Poag, MD as Consulting Physician (Allergy and Immunology) Gus Height, MD as Consulting Physician (Obstetrics and Gynecology) Rutherford Guys, MD as Consulting Physician (Ophthalmology) Walton, Mosier as Consulting Physician (Hand Surgery) Leanora Cover, MD as Consulting Physician (Orthopedic Surgery)   Past Medical History:  Diagnosis Date  . ADD (attention deficit disorder)   . Allergy    allergic rhinitis  . Anemia 07/25/2013  . Angio-edema   . Back pain   . Costochondritis 02/20/2015  . Diabetes mellitus    Type II   previously 3 years ago - no longer on meds   . Dyslipidemia 07/25/2013  . Fungus infection 10/13   Left great toe  . GERD (gastroesophageal reflux disease)   . Hypertension   . IBS (irritable bowel syndrome)   . Ingrown nail 10/13   right foot next to the last toe  . Insomnia 04/20/2013  . Joint pain   . Lactose intolerance   . Leg edema   . Obesity   . Pedal edema 02/20/2015  . Prediabetes   . Preventative health care 09/25/2015  . PVC (premature ventricular contraction)     Past Surgical History:  Procedure Laterality Date  . HYSTEROSCOPY N/A 10/04/2015   Procedure: HYSTEROSCOPY with Removal IUD;   Surgeon: Vanessa Kick, MD;  Location: [facility];  Service: Gynecology;  Laterality: N/A;  . lasik     eye surgery  . WISDOM TOOTH EXTRACTION      Family History  Problem Relation Age of Onset  . Heart attack Mother 28  . Hypertension Mother   . Heart disease Mother   . Obesity Mother   . Cancer Father        prostate  . Heart attack Sister 54  . Hypertension Other     Social History   Socioeconomic History  . Marital status: Single    Spouse name: Not on file  . Number of children: Not on file  . Years of education: Not on file  . Highest education level: Not on file  Occupational History  . Occupation: Pharmacist, hospital, [employer]  Social Needs  . Financial resource strain: Not on file  . Food insecurity:    Worry: Not on file    Inability: Not on file  . Transportation needs:    Medical: Not on file    Non-medical: Not on file  Tobacco Use  . Smoking status: Former Smoker    Packs/day: 1.00    Years: 15.00    Pack years: 15.00    Last attempt to quit: 09/03/2001    Years since quitting: 16.5  . Smokeless tobacco: Never Used  Substance and Sexual Activity  .  Alcohol use: Yes    Comment: rare  . Drug use: No  . Sexual activity: Not on file  Lifestyle  . Physical activity:    Days per week: Not on file    Minutes per session: Not on file  . Stress: Not on file  Relationships  . Social connections:    Talks on phone: Not on file    Gets together: Not on file    Attends religious service: Not on file    Active member of club or organization: Not on file    Attends meetings of clubs or organizations: Not on file    Relationship status: Not on file  . Intimate partner violence:    Fear of current or ex partner: Not on file    Emotionally abused: Not on file    Physically abused: Not on file    Forced sexual activity: Not on file  Other Topics Concern  . Not on file  Social History Narrative  . Not on file    Outpatient Medications Prior to Visit  Medication  Sig Dispense Refill  . acetaminophen (TYLENOL 8 HOUR) 650 MG CR tablet Take 1 tablet (650 mg total) every 8 (eight) hours as needed by mouth for pain.    [name] amLODipine (NORVASC) 5 MG tablet Take 1 tablet (5 mg total) by mouth daily. 90 tablet 1  . aspirin EC 81 MG tablet Take 1 tablet (81 mg total) by mouth daily.    [name] azelastine (ASTELIN) 0.1 % nasal spray Place 2 sprays into both nostrils 2 (two) times daily. Use in each nostril as directed 90 mL 1  . Cyanocobalamin (B-12) 1000 MCG CAPS Take by mouth daily.    [name] EPINEPHrine (EPIPEN 2-PAK) 0.3 mg/0.3 mL IJ SOAJ injection Use as directed for severe allergic reaction 2 Device 1  . fexofenadine (ALLEGRA ALLERGY) 180 MG tablet Take 1 tablet (180 mg total) by mouth daily. 301 tablet 1  . fluticasone (FLONASE) 50 MCG/ACT nasal spray Place 2 sprays into both nostrils daily. (Patient taking differently: Place 2 sprays into both nostrils daily as needed for allergies. ) 16 g 11  . glucose blood test strip OneTouch Ultra Test strips    . ibuprofen (ADVIL,MOTRIN) 600 MG tablet Take 1 tablet (600 mg total) by mouth every 6 (six) hours as needed. 90 tablet 0  . losartan-hydrochlorothiazide (HYZAAR) 100-25 MG tablet TAKE 1 TABLET BY MOUTH DAILY. 90 tablet 1  . metFORMIN (GLUCOPHAGE) 500 MG tablet TAKE 1 TABLET (500 MG TOTAL) BY MOUTH DAILY WITH BREAKFAST. 90 tablet 1  . Omega-3 Fatty Acids (FISH OIL PO) Take 1 capsule by mouth daily.    [name] DELICA LANCETS 42A MISC OneTouch Delica Lancets 33 gauge    . pantoprazole (PROTONIX) 40 MG tablet Take 1 tablet (40 mg total) by mouth daily. 90 tablet 1  . potassium chloride SA (K-DUR,KLOR-CON) 20 MEQ tablet Take 1 tablet (20 mEq total) by mouth 3 (three) times daily. 120 tablet 3  . Probiotic Product (PROBIOTIC-10 PO) Take by mouth daily.    . TURMERIC PO Take 1 capsule by mouth daily.    . montelukast (SINGULAIR) 10 MG tablet Take 1 tablet (10 mg total) by mouth at bedtime. 90 tablet 1  . Vitamin D,  Ergocalciferol, (DRISDOL) 50000 units CAPS capsule Take 1 capsule (50,000 Units total) by mouth every 7 (seven) days. 4 capsule 0   No facility-administered medications prior to visit.     Allergies  Allergen Reactions  .  Penicillins Itching and Swelling    Has patient had a PCN reaction causing immediate rash, facial/tongue/throat swelling, SOB or lightheadedness with hypotension: yes Has patient had a PCN reaction causing severe rash involving mucus membranes or skin necrosis: no Has patient had a PCN reaction that required hospitalization no Has patient had a PCN reaction occurring within the last 10 years: yes If all of the above answers are "NO", then may proceed with Cephalosporin use.   [name] [Liraglutide]     Vomiting    Review of Systems  Constitutional: Negative for fever and malaise/fatigue.  HENT: Negative for congestion.   Eyes: Negative for blurred vision.  Respiratory: Negative for shortness of breath.   Cardiovascular: Negative for chest pain, palpitations and leg swelling.  Gastrointestinal: Negative for abdominal pain, blood in stool and nausea.  Genitourinary: Negative for dysuria and frequency.  Musculoskeletal: Negative for falls.  Skin: Negative for rash.  Neurological: Negative for dizziness, loss of consciousness and headaches.  Endo/Heme/Allergies: Negative for environmental allergies.  Psychiatric/Behavioral: Negative for depression. The patient is not nervous/anxious.        Objective:    Physical Exam  Constitutional: She is oriented to person, place, and time. She appears well-developed and well-nourished. No distress.  HENT:  Head: Normocephalic and atraumatic.  Eyes: Conjunctivae are normal.  Neck: Neck supple. No thyromegaly present.  Cardiovascular: Normal rate, regular rhythm and normal heart sounds.  No murmur heard. Pulmonary/Chest: Effort normal and breath sounds normal. No respiratory distress.  Abdominal: Soft. Bowel sounds are  normal. She exhibits no distension and no mass. There is no tenderness.  Musculoskeletal: She exhibits no edema.  Lymphadenopathy:    She has no cervical adenopathy.  Neurological: She is alert and oriented to person, place, and time.  Skin: Skin is warm and dry.  Psychiatric: She has a normal mood and affect. Her behavior is normal.    BP 126/84 (BP Location: Left Arm, Patient Position: Sitting, Cuff Size: Normal)   Pulse 84   Temp 99.4 F (37.4 C) (Oral)   Resp 18   Ht 5\' 10"  (1.778 m)   Wt (!) 312 lb (141.5 kg)   SpO2 96%   BMI 44.77 kg/m  Wt Readings from Last 3 Encounters:  03/13/18 (!) 312 lb (141.5 kg)  03/04/18 (!) 305 lb (138.3 kg)  02/18/18 300 lb (136.1 kg)   BP Readings from Last 3 Encounters:  03/13/18 126/84  03/04/18 122/78  02/18/18 133/83     Immunization History  Administered Date(s) Administered  . Influenza Whole 06/22/2009, 08/03/2010  . Influenza,inj,Quad PF,6+ Mos 07/20/2013, 05/11/2014, 06/29/2015, 06/26/2016, 07/23/2017  . Pneumococcal Conjugate-13 09/15/2015  . Td 07/21/2009    Health Maintenance  Topic Date Due  . FOOT EXAM  11/06/2017  . OPHTHALMOLOGY EXAM  03/29/2018  . INFLUENZA VACCINE  04/03/2018  . PAP SMEAR  04/03/2018  . HEMOGLOBIN A1C  09/13/2018  . TETANUS/TDAP  07/22/2019  . PNEUMOCOCCAL POLYSACCHARIDE VACCINE (2) 09/14/2020  . HIV Screening  Addressed    Lab Results  Component Value Date   WBC 5.9 03/13/2018   HGB 11.9 (L) 03/13/2018   HCT 35.9 (L) 03/13/2018   PLT 283.0 03/13/2018   GLUCOSE 86 03/13/2018   CHOL 140 03/13/2018   TRIG 66.0 03/13/2018   HDL 40.00 03/13/2018   LDLCALC 86 03/13/2018   ALT 9 03/13/2018   AST 9 03/13/2018   NA 139 03/13/2018   K 3.7 03/13/2018   CL 103 03/13/2018   CREATININE 0.60  03/13/2018   BUN 9 03/13/2018   CO2 29 03/13/2018   TSH 2.10 03/13/2018   HGBA1C 5.9 03/13/2018   MICROALBUR 0.50 12/15/2012    Lab Results  Component Value Date   TSH 2.10 03/13/2018   Lab Results   Component Value Date   WBC 5.9 03/13/2018   HGB 11.9 (L) 03/13/2018   HCT 35.9 (L) 03/13/2018   MCV 82.4 03/13/2018   PLT 283.0 03/13/2018   Lab Results  Component Value Date   NA 139 03/13/2018   K 3.7 03/13/2018   CO2 29 03/13/2018   GLUCOSE 86 03/13/2018   BUN 9 03/13/2018   CREATININE 0.60 03/13/2018   BILITOT 0.3 03/13/2018   ALKPHOS 67 03/13/2018   AST 9 03/13/2018   ALT 9 03/13/2018   PROT 7.0 03/13/2018   ALBUMIN 4.0 03/13/2018   CALCIUM 8.9 03/13/2018   GFR 136.18 03/13/2018   Lab Results  Component Value Date   CHOL 140 03/13/2018   Lab Results  Component Value Date   HDL 40.00 03/13/2018   Lab Results  Component Value Date   LDLCALC 86 03/13/2018   Lab Results  Component Value Date   TRIG 66.0 03/13/2018   Lab Results  Component Value Date   CHOLHDL 3 03/13/2018   Lab Results  Component Value Date   HGBA1C 5.9 03/13/2018         Assessment & Plan:   Problem List Items Addressed This Visit    Obesity    Encouraged DASH diet, decrease po intake and increase exercise as tolerated. Needs 7-8 hours of sleep nightly. Avoid trans fats, eat small, frequent meals every 4-5 hours with lean proteins, complex carbs and healthy fats. Minimize simple carbs, following with bariatric program      Essential hypertension    Well controlled, no changes to meds. Encouraged heart healthy diet such as the DASH diet and exercise as tolerated.       Relevant Orders   CBC (Completed)   Comprehensive metabolic panel (Completed)   TSH (Completed)   Anemia    Increase leafy greens, consider increased lean red meat and using cast iron cookware. Continue to monitor, report any concerns      Dyslipidemia    Encouraged heart healthy diet, increase exercise, avoid trans fats, consider a krill oil cap daily      Relevant Orders   Lipid panel (Completed)   Preventative health care    Patient encouraged to maintain heart healthy diet, regular exercise, adequate  sleep. Consider daily probiotics. Take medications as prescribed. Given and reviewed copy of ACP documents from [employer] and encouraged to complete and return      Vitamin D deficiency    Supplement and monitor      Relevant Medications   Vitamin D, Ergocalciferol, (DRISDOL) 50000 units CAPS capsule   Other Relevant Orders   VITAMIN D 25 Hydroxy (Vit-D Deficiency, Fractures) (Completed)   Type 2 diabetes mellitus without complication, without long-term current use of insulin (HCC)    hgba1c acceptable, minimize simple carbs. Increase exercise as tolerated.       Relevant Orders   Hemoglobin A1c (Completed)   Insulin, random (Completed)   Hypertriglyceridemia    Encouraged heart healthy diet, increase exercise, avoid trans fats, consider a krill oil cap daily         I have changed Brandi Lester's montelukast. I am also having her maintain her fluticasone, fexofenadine, TURMERIC PO, Omega-3 Fatty Acids (FISH OIL  PO), ibuprofen, azelastine, pantoprazole, aspirin EC, ONETOUCH DELICA LANCETS 25H, glucose blood, EPINEPHrine, B-12, Probiotic Product (PROBIOTIC-10 PO), acetaminophen, amLODipine, potassium chloride SA, losartan-hydrochlorothiazide, metFORMIN, and Vitamin D (Ergocalciferol).  Meds ordered this encounter  Medications  . montelukast (SINGULAIR) 10 MG tablet    Sig: Take 1 tablet (10 mg total) by mouth at bedtime as needed.    Dispense:  90 tablet    Refill:  1  . Vitamin D, Ergocalciferol, (DRISDOL) 50000 units CAPS capsule    Sig: Take 1 capsule (50,000 Units total) by mouth every 7 (seven) days.    Dispense:  4 capsule    Refill:  4    CMA served as scribe during this visit. History, Physical and Plan performed by medical provider. Documentation and orders reviewed and attested to.  Penni Homans, MD

## 2018-03-13 NOTE — Assessment & Plan Note (Signed)
Well controlled, no changes to meds. Encouraged heart healthy diet such as the DASH diet and exercise as tolerated.  °

## 2018-03-14 LAB — INSULIN, RANDOM: INSULIN: 12.1 u[IU]/mL (ref 2.0–19.6)

## 2018-03-17 ENCOUNTER — Ambulatory Visit (INDEPENDENT_AMBULATORY_CARE_PROVIDER_SITE_OTHER): Payer: BC Managed Care – PPO | Admitting: *Deleted

## 2018-03-17 DIAGNOSIS — J309 Allergic rhinitis, unspecified: Secondary | ICD-10-CM

## 2018-03-17 MED ORDER — VITAMIN D (ERGOCALCIFEROL) 1.25 MG (50000 UNIT) PO CAPS: 50000.0000 [IU] | ORAL_CAPSULE | ORAL | 4 refills | Status: DC

## 2018-03-18 MED FILL — VIT D2 1.25 MG (50,000 UNIT: 1.25 MG | 28 days supply | Qty: 4 | Fill #0

## 2018-03-19 NOTE — Assessment & Plan Note (Signed)
Encouraged heart healthy diet, increase exercise, avoid trans fats, consider a krill oil cap daily 

## 2018-03-21 MED FILL — MEDROXYPROGESTERONE 10 MG T: 10 | 30 days supply | Qty: 30 | Fill #3

## 2018-03-31 ENCOUNTER — Ambulatory Visit (INDEPENDENT_AMBULATORY_CARE_PROVIDER_SITE_OTHER): Payer: BC Managed Care – PPO | Admitting: Physician Assistant

## 2018-03-31 VITALS — BP 127/84 | HR 95 | Temp 98.4°F | Ht 70.0 in | Wt 306.0 lb

## 2018-03-31 DIAGNOSIS — E559 Vitamin D deficiency, unspecified: Secondary | ICD-10-CM | POA: Diagnosis not present

## 2018-03-31 DIAGNOSIS — Z6841 Body Mass Index (BMI) 40.0 and over, adult: Secondary | ICD-10-CM

## 2018-04-01 ENCOUNTER — Ambulatory Visit: Payer: BC Managed Care – PPO | Admitting: Allergy and Immunology

## 2018-04-01 ENCOUNTER — Encounter: Payer: Self-pay | Admitting: Allergy and Immunology

## 2018-04-01 VITALS — BP 134/76 | HR 94 | Resp 18

## 2018-04-01 DIAGNOSIS — J3089 Other allergic rhinitis: Secondary | ICD-10-CM | POA: Diagnosis not present

## 2018-04-01 NOTE — Progress Notes (Signed)
Brandi Lester presents to this clinic to have skin testing performed.  She demonstrated hypersensitivity against grass and dust mite.  She will perform allergen avoidance measures and continue her previously prescribed medications and we will see what type of affect we get over the course of the next several months.

## 2018-04-01 NOTE — Progress Notes (Signed)
Office: [phone]  /  Fax: [phone]   HPI:   Chief Complaint: OBESITY Brandi Lester is here to discuss her progress with her obesity treatment plan. She is on the Category 3 plan and is following her eating plan approximately 75 % of the time. She states she is exercising at the gym and walking around [facility] for 30 to 45 minutes 4 times per week. [name] struggled to follow the category plan recently. She reports eating more simple carbohydrates than normal. She also reports being bored with dinner. She is ready to get back on track. Her weight is (!) 306 lb (138.8 kg) today and has had a weight gain of 1 pound over a period of 4 weeks since her last visit. She has lost 19 lbs since starting treatment with [name].  Vitamin D deficiency Brandi Lester has a diagnosis of vitamin D deficiency. [name] is on prescription vit D and last level was not at goal. She denies nausea, vomiting or muscle weakness.  ALLERGIES: Allergies  Allergen Reactions  . Penicillins Itching and Swelling    Has patient had a PCN reaction causing immediate rash, facial/tongue/throat swelling, SOB or lightheadedness with hypotension: yes Has patient had a PCN reaction causing severe rash involving mucus membranes or skin necrosis: no Has patient had a PCN reaction that required hospitalization no Has patient had a PCN reaction occurring within the last 10 years: yes If all of the above answers are "NO", then may proceed with Cephalosporin use.   Brandi Lester [Liraglutide]     Vomiting    MEDICATIONS: Current Outpatient Medications on File Prior to Visit  Medication Sig Dispense Refill  . acetaminophen (TYLENOL 8 HOUR) 650 MG CR tablet Take 1 tablet (650 mg total) every 8 (eight) hours as needed by mouth for pain.    [name] amLODipine (NORVASC) 5 MG tablet Take 1 tablet (5 mg total) by mouth daily. 90 tablet 1  . aspirin EC 81 MG tablet Take 1 tablet (81 mg total) by mouth daily.    [name] azelastine (ASTELIN) 0.1 % nasal spray Place  2 sprays into both nostrils 2 (two) times daily. Use in each nostril as directed 90 mL 1  . Cyanocobalamin (B-12) 1000 MCG CAPS Take by mouth daily.    [name] EPINEPHrine (EPIPEN 2-PAK) 0.3 mg/0.3 mL IJ SOAJ injection Use as directed for severe allergic reaction 2 Device 1  . fexofenadine (ALLEGRA ALLERGY) 180 MG tablet Take 1 tablet (180 mg total) by mouth daily. 301 tablet 1  . fluticasone (FLONASE) 50 MCG/ACT nasal spray Place 2 sprays into both nostrils daily. (Patient taking differently: Place 2 sprays into both nostrils daily as needed for allergies. ) 16 g 11  . glucose blood test strip OneTouch Ultra Test strips    . ibuprofen (ADVIL,MOTRIN) 600 MG tablet Take 1 tablet (600 mg total) by mouth every 6 (six) hours as needed. 90 tablet 0  . losartan-hydrochlorothiazide (HYZAAR) 100-25 MG tablet TAKE 1 TABLET BY MOUTH DAILY. 90 tablet 1  . metFORMIN (GLUCOPHAGE) 500 MG tablet TAKE 1 TABLET (500 MG TOTAL) BY MOUTH DAILY WITH BREAKFAST. 90 tablet 1  . montelukast (SINGULAIR) 10 MG tablet Take 1 tablet (10 mg total) by mouth at bedtime as needed. 90 tablet 1  . Omega-3 Fatty Acids (FISH OIL PO) Take 1 capsule by mouth daily.    Brandi Lester DELICA LANCETS 21F MISC OneTouch Delica Lancets 33 gauge    . pantoprazole (PROTONIX) 40 MG tablet Take 1 tablet (40 mg total) by  mouth daily. 90 tablet 1  . potassium chloride SA (K-DUR,KLOR-CON) 20 MEQ tablet Take 1 tablet (20 mEq total) by mouth 3 (three) times daily. 120 tablet 3  . Probiotic Product (PROBIOTIC-10 PO) Take by mouth daily.    . TURMERIC PO Take 1 capsule by mouth daily.    . Vitamin D, Ergocalciferol, (DRISDOL) 50000 units CAPS capsule Take 1 capsule (50,000 Units total) by mouth every 7 (seven) days. 4 capsule 4   No current facility-administered medications on file prior to visit.     PAST MEDICAL HISTORY: Past Medical History:  Diagnosis Date  . ADD (attention deficit disorder)   . Allergy    allergic rhinitis  . Anemia 07/25/2013  .  Angio-edema   . Back pain   . Costochondritis 02/20/2015  . Diabetes mellitus    Type II   previously 3 years ago - no longer on meds   . Dyslipidemia 07/25/2013  . Fungus infection 10/13   Left great toe  . GERD (gastroesophageal reflux disease)   . Hypertension   . IBS (irritable bowel syndrome)   . Ingrown nail 10/13   right foot next to the last toe  . Insomnia 04/20/2013  . Joint pain   . Lactose intolerance   . Leg edema   . Obesity   . Pedal edema 02/20/2015  . Prediabetes   . Preventative health care 09/25/2015  . PVC (premature ventricular contraction)     PAST SURGICAL HISTORY: Past Surgical History:  Procedure Laterality Date  . HYSTEROSCOPY N/A 10/04/2015   Procedure: HYSTEROSCOPY with Removal IUD;  Surgeon: Vanessa Kick, MD;  Location: [facility];  Service: Gynecology;  Laterality: N/A;  . lasik     eye surgery  . WISDOM TOOTH EXTRACTION      SOCIAL HISTORY: Social History   Tobacco Use  . Smoking status: Former Smoker    Packs/day: 1.00    Years: 15.00    Pack years: 15.00    Last attempt to quit: 09/03/2001    Years since quitting: 16.5  . Smokeless tobacco: Never Used  Substance Use Topics  . Alcohol use: Yes    Comment: rare  . Drug use: No    FAMILY HISTORY: Family History  Problem Relation Age of Onset  . Heart attack Mother 43  . Hypertension Mother   . Heart disease Mother   . Obesity Mother   . Cancer Father        prostate  . Heart attack Sister 15  . Hypertension Other     ROS: Review of Systems  Constitutional: Negative for weight loss.  Gastrointestinal: Negative for nausea and vomiting.  Musculoskeletal:       Negative for muscle weakness    PHYSICAL EXAM: Blood pressure 127/84, pulse 95, temperature 98.4 F (36.9 C), temperature source Oral, height 5\' 10"  (1.778 m), weight (!) 306 lb (138.8 kg), SpO2 99 %. Body mass index is 43.91 kg/m. Physical Exam  Constitutional: She is oriented to person, place, and time. She appears  well-developed and well-nourished.  Cardiovascular: Normal rate.  Pulmonary/Chest: Effort normal.  Musculoskeletal: Normal range of motion.  Neurological: She is oriented to person, place, and time.  Skin: Skin is warm and dry.  Psychiatric: She has a normal mood and affect. Her behavior is normal.  Vitals reviewed.   RECENT LABS AND TESTS: BMET    Component Value Date/Time   NA 139 03/13/2018 1152   NA 137 09/12/2017 0927   K 3.7 03/13/2018 1152  CL 103 03/13/2018 1152   CO2 29 03/13/2018 1152   GLUCOSE 86 03/13/2018 1152   BUN 9 03/13/2018 1152   BUN 10 09/12/2017 0927   CREATININE 0.60 03/13/2018 1152   CREATININE 0.72 02/03/2014 1648   CALCIUM 8.9 03/13/2018 1152   GFRNONAA 105 09/12/2017 0927   GFRAA 121 09/12/2017 0927   Lab Results  Component Value Date   HGBA1C 5.9 03/13/2018   HGBA1C 5.8 (H) 09/12/2017   HGBA1C 6.2 (H) 05/23/2017   HGBA1C 6.6 (H) 03/15/2017   HGBA1C 6.3 11/06/2016   Lab Results  Component Value Date   INSULIN 12.9 09/12/2017   INSULIN 31.1 (H) 05/23/2017   CBC    Component Value Date/Time   WBC 5.9 03/13/2018 1152   RBC 4.35 03/13/2018 1152   HGB 11.9 (L) 03/13/2018 1152   HGB 12.1 05/23/2017 1025   HCT 35.9 (L) 03/13/2018 1152   HCT 36.6 05/23/2017 1025   PLT 283.0 03/13/2018 1152   MCV 82.4 03/13/2018 1152   MCV 82 05/23/2017 1025   MCH 27.1 05/23/2017 1025   MCH 28.2 02/03/2014 1648   MCHC 33.2 03/13/2018 1152   RDW 15.0 03/13/2018 1152   RDW 14.9 05/23/2017 1025   LYMPHSABS 1.7 05/23/2017 1025   MONOABS 0.4 06/09/2014 0843   EOSABS 0.1 05/23/2017 1025   BASOSABS 0.0 05/23/2017 1025   Iron/TIBC/Ferritin/ %Sat No results found for: IRON, TIBC, FERRITIN, IRONPCTSAT Lipid Panel     Component Value Date/Time   CHOL 140 03/13/2018 1152   CHOL 132 09/12/2017 0927   TRIG 66.0 03/13/2018 1152   HDL 40.00 03/13/2018 1152   HDL 32 (L) 09/12/2017 0927   CHOLHDL 3 03/13/2018 1152   VLDL 13.2 03/13/2018 1152   LDLCALC 86  03/13/2018 1152   LDLCALC 81 09/12/2017 0927   Hepatic Function Panel     Component Value Date/Time   PROT 7.0 03/13/2018 1152   PROT 6.9 09/12/2017 0927   ALBUMIN 4.0 03/13/2018 1152   ALBUMIN 4.1 09/12/2017 0927   AST 9 03/13/2018 1152   ALT 9 03/13/2018 1152   ALKPHOS 67 03/13/2018 1152   BILITOT 0.3 03/13/2018 1152   BILITOT 0.2 09/12/2017 0927   BILIDIR 0.0 06/09/2014 0843   IBILI 0.1 (L) 02/03/2014 1648      Component Value Date/Time   TSH 2.10 03/13/2018 1152   TSH 1.710 05/23/2017 1025   TSH 1.26 03/15/2017 1140   Results for THEONE, BOWELL (MRN [id number]) as of 04/01/2018 10:37  Ref. Range 09/12/2017 09:27  Vitamin D, 25-Hydroxy Latest Ref Range: 30.0 - 100.0 ng/mL 27.9 (L)   ASSESSMENT AND PLAN: Vitamin D deficiency  Class 3 severe obesity with serious comorbidity and body mass index (BMI) of 40.0 to 44.9 in adult, unspecified obesity type (HCC)  PLAN:  Vitamin D Deficiency Pennye was informed that low vitamin D levels contributes to fatigue and are associated with obesity, breast, and colon cancer. She agrees to continue to take prescription Vit D @50 ,000 IU every week and will follow up for routine testing of vitamin D, at least 2-3 times per year. She was informed of the risk of over-replacement of vitamin D and agrees to not increase her dose unless she discusses this with [name] first.  We spent > than 50% of the 15 minute visit on the counseling as documented in the note.  Obesity Maleigha is currently in the action stage of change. As such, her goal is to continue with weight loss efforts She has agreed  to change to keeping a food journal with 450 to 600 calories and 40+ grams of protein at supper daily and follow the Category 3 plan Keigan has been instructed to work up to a goal of 150 minutes of combined cardio and strengthening exercise per week for weight loss and overall health benefits. We discussed the following Behavioral Modification Strategies  today: increasing lean protein intake and decreasing simple carbohydrates   Lasheika has agreed to follow up with our clinic in 2 to 3 weeks. She was informed of the importance of frequent follow up visits to maximize her success with intensive lifestyle modifications for her multiple health conditions.   OBESITY BEHAVIORAL INTERVENTION VISIT  Today's visit was # 19 out of 22.  Starting weight: 325 lbs Starting date: 05/23/17 Today's weight : 306 lbs Today's date: 03/31/2018 Total lbs lost to date: 66    ASK: We discussed the diagnosis of obesity with [name] today and Tanyika agreed to give [name] permission to discuss obesity behavioral modification therapy today.  ASSESS: Lycia has the diagnosis of obesity and her BMI today is 43.91 Evie is in the action stage of change   ADVISE: Rodneisha was educated on the multiple health risks of obesity as well as the benefit of weight loss to improve her health. She was advised of the need for long term treatment and the importance of lifestyle modifications.  AGREE: Multiple dietary modification options and treatment options were discussed and  Jamilett agreed to the above obesity treatment plan.  Corey Skains, am acting as transcriptionist for Abby Potash, PA-C I, Abby Potash, PA-C have reviewed above note and agree with its content

## 2018-04-02 ENCOUNTER — Encounter: Payer: Self-pay | Admitting: Allergy and Immunology

## 2018-04-10 MED FILL — POTASSIUM CL ER 20 MEQ TAB: 20 | 30 days supply | Qty: 120 | Fill #0

## 2018-04-14 ENCOUNTER — Ambulatory Visit (INDEPENDENT_AMBULATORY_CARE_PROVIDER_SITE_OTHER): Payer: BC Managed Care – PPO | Admitting: Physician Assistant

## 2018-04-14 VITALS — BP 128/77 | HR 89 | Temp 98.7°F | Ht 70.0 in | Wt 307.0 lb

## 2018-04-14 DIAGNOSIS — E559 Vitamin D deficiency, unspecified: Secondary | ICD-10-CM | POA: Diagnosis not present

## 2018-04-14 DIAGNOSIS — Z6841 Body Mass Index (BMI) 40.0 and over, adult: Secondary | ICD-10-CM

## 2018-04-14 DIAGNOSIS — E66813 Obesity, class 3: Secondary | ICD-10-CM

## 2018-04-15 NOTE — Progress Notes (Signed)
Office: [phone]  /  Fax: [phone]   HPI:   Chief Complaint: OBESITY Brandi Lester is here to discuss her progress with her obesity treatment plan. She is on the keep a food journal with 450-600 calories and 40+ grams of protein at supper daily and follow the Category 3 plan and is following her eating plan approximately 70 % of the time. She states she is walking for 35 minutes 4 times per week. Lamees struggled to follow Category 3 plan. She reports not being able to get in all protein during the day. She also states that she has been skipping meals. She is ready to get back on track.  Her weight is (!) 307 lb (139.3 kg) today and has gained 1 pound since her last visit. She has lost 18 lbs since starting treatment with [name].  Vitamin D Deficiency Tamzin has a diagnosis of vitamin D deficiency. She is on prescription Vit D and denies nausea, vomiting or muscle weakness.  ALLERGIES: Allergies  Allergen Reactions  . Penicillins Itching and Swelling    Has patient had a PCN reaction causing immediate rash, facial/tongue/throat swelling, SOB or lightheadedness with hypotension: yes Has patient had a PCN reaction causing severe rash involving mucus membranes or skin necrosis: no Has patient had a PCN reaction that required hospitalization no Has patient had a PCN reaction occurring within the last 10 years: yes If all of the above answers are "NO", then may proceed with Cephalosporin use.   [name] [Liraglutide]     Vomiting    MEDICATIONS: Current Outpatient Medications on File Prior to Visit  Medication Sig Dispense Refill  . acetaminophen (TYLENOL 8 HOUR) 650 MG CR tablet Take 1 tablet (650 mg total) every 8 (eight) hours as needed by mouth for pain.    [name] amLODipine (NORVASC) 5 MG tablet Take 1 tablet (5 mg total) by mouth daily. 90 tablet 1  . aspirin EC 81 MG tablet Take 1 tablet (81 mg total) by mouth daily.    [name] azelastine (ASTELIN) 0.1 % nasal spray Place 2 sprays into  both nostrils 2 (two) times daily. Use in each nostril as directed 90 mL 1  . Cyanocobalamin (B-12) 1000 MCG CAPS Take by mouth daily.    [name] EPINEPHrine (EPIPEN 2-PAK) 0.3 mg/0.3 mL IJ SOAJ injection Use as directed for severe allergic reaction 2 Device 1  . fexofenadine (ALLEGRA ALLERGY) 180 MG tablet Take 1 tablet (180 mg total) by mouth daily. 301 tablet 1  . fluticasone (FLONASE) 50 MCG/ACT nasal spray Place 2 sprays into both nostrils daily. (Patient taking differently: Place 2 sprays into both nostrils daily as needed for allergies. ) 16 g 11  . glucose blood test strip OneTouch Ultra Test strips    . ibuprofen (ADVIL,MOTRIN) 600 MG tablet Take 1 tablet (600 mg total) by mouth every 6 (six) hours as needed. 90 tablet 0  . losartan-hydrochlorothiazide (HYZAAR) 100-25 MG tablet TAKE 1 TABLET BY MOUTH DAILY. 90 tablet 1  . metFORMIN (GLUCOPHAGE) 500 MG tablet TAKE 1 TABLET (500 MG TOTAL) BY MOUTH DAILY WITH BREAKFAST. 90 tablet 1  . montelukast (SINGULAIR) 10 MG tablet Take 1 tablet (10 mg total) by mouth at bedtime as needed. 90 tablet 1  . Omega-3 Fatty Acids (FISH OIL PO) Take 1 capsule by mouth daily.    [name] DELICA LANCETS 64Q MISC OneTouch Delica Lancets 33 gauge    . pantoprazole (PROTONIX) 40 MG tablet Take 1 tablet (40 mg total) by mouth  daily. (Patient taking differently: Take 40 mg by mouth every other day. ) 90 tablet 1  . potassium chloride SA (K-DUR,KLOR-CON) 20 MEQ tablet Take 1 tablet (20 mEq total) by mouth 3 (three) times daily. 120 tablet 3  . Probiotic Product (PROBIOTIC-10 PO) Take by mouth daily.    . TURMERIC PO Take 1 capsule by mouth daily.    . Vitamin D, Ergocalciferol, (DRISDOL) 50000 units CAPS capsule Take 1 capsule (50,000 Units total) by mouth every 7 (seven) days. 4 capsule 4   No current facility-administered medications on file prior to visit.     PAST MEDICAL HISTORY: Past Medical History:  Diagnosis Date  . ADD (attention deficit disorder)   .  Allergy    allergic rhinitis  . Anemia 07/25/2013  . Angio-edema   . Back pain   . Costochondritis 02/20/2015  . Diabetes mellitus    Type II   previously 3 years ago - no longer on meds   . Dyslipidemia 07/25/2013  . Fungus infection 10/13   Left great toe  . GERD (gastroesophageal reflux disease)   . Hypertension   . IBS (irritable bowel syndrome)   . Ingrown nail 10/13   right foot next to the last toe  . Insomnia 04/20/2013  . Joint pain   . Lactose intolerance   . Leg edema   . Obesity   . Pedal edema 02/20/2015  . Prediabetes   . Preventative health care 09/25/2015  . PVC (premature ventricular contraction)     PAST SURGICAL HISTORY: Past Surgical History:  Procedure Laterality Date  . HYSTEROSCOPY N/A 10/04/2015   Procedure: HYSTEROSCOPY with Removal IUD;  Surgeon: Vanessa Kick, MD;  Location: [facility];  Service: Gynecology;  Laterality: N/A;  . lasik     eye surgery  . WISDOM TOOTH EXTRACTION      SOCIAL HISTORY: Social History   Tobacco Use  . Smoking status: Former Smoker    Packs/day: 1.00    Years: 15.00    Pack years: 15.00    Last attempt to quit: 09/03/2001    Years since quitting: 16.6  . Smokeless tobacco: Never Used  Substance Use Topics  . Alcohol use: Yes    Comment: rare  . Drug use: No    FAMILY HISTORY: Family History  Problem Relation Age of Onset  . Heart attack Mother 44  . Hypertension Mother   . Heart disease Mother   . Obesity Mother   . Cancer Father        prostate  . Heart attack Sister 69  . Hypertension Other     ROS: Review of Systems  Constitutional: Negative for weight loss.  Gastrointestinal: Negative for nausea and vomiting.  Musculoskeletal:       Negative muscle weakness    PHYSICAL EXAM: Blood pressure 128/77, pulse 89, temperature 98.7 F (37.1 C), temperature source Oral, height 5\' 10"  (1.778 m), weight (!) 307 lb (139.3 kg), SpO2 100 %. Body mass index is 44.05 kg/m. Physical Exam  Constitutional: She  is oriented to person, place, and time. She appears well-developed and well-nourished.  Cardiovascular: Normal rate.  Pulmonary/Chest: Effort normal.  Musculoskeletal: Normal range of motion.  Neurological: She is oriented to person, place, and time.  Skin: Skin is warm and dry.  Psychiatric: She has a normal mood and affect. Her behavior is normal.  Vitals reviewed.   RECENT LABS AND TESTS: BMET    Component Value Date/Time   NA 139 03/13/2018 1152  NA 137 09/12/2017 0927   K 3.7 03/13/2018 1152   CL 103 03/13/2018 1152   CO2 29 03/13/2018 1152   GLUCOSE 86 03/13/2018 1152   BUN 9 03/13/2018 1152   BUN 10 09/12/2017 0927   CREATININE 0.60 03/13/2018 1152   CREATININE 0.72 02/03/2014 1648   CALCIUM 8.9 03/13/2018 1152   GFRNONAA 105 09/12/2017 0927   GFRAA 121 09/12/2017 0927   Lab Results  Component Value Date   HGBA1C 5.9 03/13/2018   HGBA1C 5.8 (H) 09/12/2017   HGBA1C 6.2 (H) 05/23/2017   HGBA1C 6.6 (H) 03/15/2017   HGBA1C 6.3 11/06/2016   Lab Results  Component Value Date   INSULIN 12.9 09/12/2017   INSULIN 31.1 (H) 05/23/2017   CBC    Component Value Date/Time   WBC 5.9 03/13/2018 1152   RBC 4.35 03/13/2018 1152   HGB 11.9 (L) 03/13/2018 1152   HGB 12.1 05/23/2017 1025   HCT 35.9 (L) 03/13/2018 1152   HCT 36.6 05/23/2017 1025   PLT 283.0 03/13/2018 1152   MCV 82.4 03/13/2018 1152   MCV 82 05/23/2017 1025   MCH 27.1 05/23/2017 1025   MCH 28.2 02/03/2014 1648   MCHC 33.2 03/13/2018 1152   RDW 15.0 03/13/2018 1152   RDW 14.9 05/23/2017 1025   LYMPHSABS 1.7 05/23/2017 1025   MONOABS 0.4 06/09/2014 0843   EOSABS 0.1 05/23/2017 1025   BASOSABS 0.0 05/23/2017 1025   Iron/TIBC/Ferritin/ %Sat No results found for: IRON, TIBC, FERRITIN, IRONPCTSAT Lipid Panel     Component Value Date/Time   CHOL 140 03/13/2018 1152   CHOL 132 09/12/2017 0927   TRIG 66.0 03/13/2018 1152   HDL 40.00 03/13/2018 1152   HDL 32 (L) 09/12/2017 0927   CHOLHDL 3 03/13/2018  1152   VLDL 13.2 03/13/2018 1152   LDLCALC 86 03/13/2018 1152   LDLCALC 81 09/12/2017 0927   Hepatic Function Panel     Component Value Date/Time   PROT 7.0 03/13/2018 1152   PROT 6.9 09/12/2017 0927   ALBUMIN 4.0 03/13/2018 1152   ALBUMIN 4.1 09/12/2017 0927   AST 9 03/13/2018 1152   ALT 9 03/13/2018 1152   ALKPHOS 67 03/13/2018 1152   BILITOT 0.3 03/13/2018 1152   BILITOT 0.2 09/12/2017 0927   BILIDIR 0.0 06/09/2014 0843   IBILI 0.1 (L) 02/03/2014 1648      Component Value Date/Time   TSH 2.10 03/13/2018 1152   TSH 1.710 05/23/2017 1025   TSH 1.26 03/15/2017 1140  Results for AUBRIA, VANECEK (MRN [id number]) as of 04/15/2018 09:06  Ref. Range 09/12/2017 09:27  Vitamin D, 25-Hydroxy Latest Ref Range: 30.0 - 100.0 ng/mL 27.9 (L)    ASSESSMENT AND PLAN: Vitamin D deficiency  Class 3 severe obesity with serious comorbidity and body mass index (BMI) of 40.0 to 44.9 in adult, unspecified obesity type (HCC)  PLAN:  Vitamin D Deficiency Sherelle was informed that low vitamin D levels contributes to fatigue and are associated with obesity, breast, and colon cancer. Renlee agrees to continue taking prescription Vit D @50 ,000 IU every week and will follow up for routine testing of vitamin D, at least 2-3 times per year. She was informed of the risk of over-replacement of vitamin D and agrees to not increase her dose unless she discusses this with [name] first. Clyde agrees to follow up with our clinic in 3 weeks.  We spent > than 50% of the 15 minute visit on the counseling as documented in the note.  Obesity Ndea  is currently in the action stage of change. As such, her goal is to continue with weight loss efforts She has agreed to keep a food journal with 1400-1500 calories and 95+ grams of protein daily Sedona has been instructed to work up to a goal of 150 minutes of combined cardio and strengthening exercise per week for weight loss and overall health benefits. We discussed  the following Behavioral Modification Strategies today: increasing lean protein intake and no skipping meals   Terie has agreed to follow up with our clinic in 3 weeks. She was informed of the importance of frequent follow up visits to maximize her success with intensive lifestyle modifications for her multiple health conditions.   OBESITY BEHAVIORAL INTERVENTION VISIT  Today's visit was # 20 out of 22.  Starting weight: 325 lbs Starting date: 05/23/17 Today's weight : 307 lbs  Today's date: 04/14/2018 Total lbs lost to date: 24    ASK: We discussed the diagnosis of obesity with [name] today and Shanae agreed to give [name] permission to discuss obesity behavioral modification therapy today.  ASSESS: Jerah has the diagnosis of obesity and her BMI today is 44.05 Taiana is in the action stage of change   ADVISE: Reveca was educated on the multiple health risks of obesity as well as the benefit of weight loss to improve her health. She was advised of the need for long term treatment and the importance of lifestyle modifications.  AGREE: Multiple dietary modification options and treatment options were discussed and  Mykaela agreed to the above obesity treatment plan.  Wilhemena Durie, am acting as transcriptionist for Abby Potash, PA-C I, Abby Potash, PA-C have reviewed above note and agree with its content

## 2018-04-22 ENCOUNTER — Other Ambulatory Visit: Payer: Self-pay | Admitting: Family Medicine

## 2018-04-22 MED FILL — MEDROXYPROGESTERONE 10 MG T: 10 | 30 days supply | Qty: 30 | Fill #4

## 2018-04-22 MED FILL — VIT D2 1.25 MG (50,000 UNIT: 1.25 MG | 28 days supply | Qty: 4 | Fill #1

## 2018-04-22 MED FILL — AMLODIPINE BESYLATE 5 MG TA: 5 | 90 days supply | Qty: 90 | Fill #0

## 2018-04-22 NOTE — Telephone Encounter (Signed)
Received refill request for amlodipine (NORVASC) 5 MG tablet.   Last office visit 03/13/2018 Last refill: 09/05/2017  Refill sent to pt's pharmacy.

## 2018-05-06 ENCOUNTER — Ambulatory Visit (INDEPENDENT_AMBULATORY_CARE_PROVIDER_SITE_OTHER): Payer: Self-pay | Admitting: Physician Assistant

## 2018-05-08 ENCOUNTER — Ambulatory Visit (INDEPENDENT_AMBULATORY_CARE_PROVIDER_SITE_OTHER): Payer: BC Managed Care – PPO | Admitting: Physician Assistant

## 2018-05-08 VITALS — BP 131/83 | HR 92 | Temp 98.9°F | Ht 70.0 in | Wt 313.0 lb

## 2018-05-08 DIAGNOSIS — E559 Vitamin D deficiency, unspecified: Secondary | ICD-10-CM | POA: Diagnosis not present

## 2018-05-08 DIAGNOSIS — Z6841 Body Mass Index (BMI) 40.0 and over, adult: Secondary | ICD-10-CM

## 2018-05-08 NOTE — Progress Notes (Signed)
Office: [phone]  /  Fax: [phone]   HPI:   Chief Complaint: OBESITY Brandi Lester is here to discuss her progress with her obesity treatment plan. She is on the keep a food journal with 1400-1500 calories and 95+ grams of protein daily and is following her eating plan approximately 70 % of the time. She states she is walking for 30-45 minutes 3 times per week. Brandi Lester struggled to journal due to family emergency. She reports that she would like to continue, as it allows her more freedom.  Her weight is (!) 313 lb (142 kg) today and has gained 6 pounds since her last visit. She has lost 12 lbs since starting treatment with [name].  Vitamin D Deficiency Brandi Lester has a diagnosis of vitamin D deficiency. She is on prescription Vit D and denies nausea, vomiting or muscle weakness.  ALLERGIES: Allergies  Allergen Reactions  . Penicillins Itching and Swelling    Has patient had a PCN reaction causing immediate rash, facial/tongue/throat swelling, SOB or lightheadedness with hypotension: yes Has patient had a PCN reaction causing severe rash involving mucus membranes or skin necrosis: no Has patient had a PCN reaction that required hospitalization no Has patient had a PCN reaction occurring within the last 10 years: yes If all of the above answers are "NO", then may proceed with Cephalosporin use.   Brandi Lester [Liraglutide]     Vomiting    MEDICATIONS: Current Outpatient Medications on File Prior to Visit  Medication Sig Dispense Refill  . acetaminophen (TYLENOL 8 HOUR) 650 MG CR tablet Take 1 tablet (650 mg total) every 8 (eight) hours as needed by mouth for pain.    [name] amLODipine (NORVASC) 5 MG tablet TAKE 1 TABLET (5 MG TOTAL) BY MOUTH DAILY. 90 tablet 1  . aspirin EC 81 MG tablet Take 1 tablet (81 mg total) by mouth daily.    [name] azelastine (ASTELIN) 0.1 % nasal spray Place 2 sprays into both nostrils 2 (two) times daily. Use in each nostril as directed 90 mL 1  . Cyanocobalamin (B-12)  1000 MCG CAPS Take by mouth daily.    [name] EPINEPHrine (EPIPEN 2-PAK) 0.3 mg/0.3 mL IJ SOAJ injection Use as directed for severe allergic reaction 2 Device 1  . fexofenadine (ALLEGRA ALLERGY) 180 MG tablet Take 1 tablet (180 mg total) by mouth daily. 301 tablet 1  . fluticasone (FLONASE) 50 MCG/ACT nasal spray Place 2 sprays into both nostrils daily. (Patient taking differently: Place 2 sprays into both nostrils daily as needed for allergies. ) 16 g 11  . glucose blood test strip OneTouch Ultra Test strips    . ibuprofen (ADVIL,MOTRIN) 600 MG tablet Take 1 tablet (600 mg total) by mouth every 6 (six) hours as needed. 90 tablet 0  . losartan-hydrochlorothiazide (HYZAAR) 100-25 MG tablet TAKE 1 TABLET BY MOUTH DAILY. 90 tablet 1  . metFORMIN (GLUCOPHAGE) 500 MG tablet TAKE 1 TABLET (500 MG TOTAL) BY MOUTH DAILY WITH BREAKFAST. 90 tablet 1  . montelukast (SINGULAIR) 10 MG tablet Take 1 tablet (10 mg total) by mouth at bedtime as needed. 90 tablet 1  . Omega-3 Fatty Acids (FISH OIL PO) Take 1 capsule by mouth daily.    [name] DELICA LANCETS 78E MISC OneTouch Delica Lancets 33 gauge    . pantoprazole (PROTONIX) 40 MG tablet Take 1 tablet (40 mg total) by mouth daily. (Patient taking differently: Take 40 mg by mouth every other day. ) 90 tablet 1  . potassium chloride SA (K-DUR,KLOR-CON) 20  MEQ tablet Take 1 tablet (20 mEq total) by mouth 3 (three) times daily. 120 tablet 3  . Probiotic Product (PROBIOTIC-10 PO) Take by mouth daily.    . TURMERIC PO Take 1 capsule by mouth daily.    . Vitamin D, Ergocalciferol, (DRISDOL) 50000 units CAPS capsule Take 1 capsule (50,000 Units total) by mouth every 7 (seven) days. 4 capsule 4   No current facility-administered medications on file prior to visit.     PAST MEDICAL HISTORY: Past Medical History:  Diagnosis Date  . ADD (attention deficit disorder)   . Allergy    allergic rhinitis  . Anemia 07/25/2013  . Angio-edema   . Back pain   . Costochondritis  02/20/2015  . Diabetes mellitus    Type II   previously 3 years ago - no longer on meds   . Dyslipidemia 07/25/2013  . Fungus infection 10/13   Left great toe  . GERD (gastroesophageal reflux disease)   . Hypertension   . IBS (irritable bowel syndrome)   . Ingrown nail 10/13   right foot next to the last toe  . Insomnia 04/20/2013  . Joint pain   . Lactose intolerance   . Leg edema   . Obesity   . Pedal edema 02/20/2015  . Prediabetes   . Preventative health care 09/25/2015  . PVC (premature ventricular contraction)     PAST SURGICAL HISTORY: Past Surgical History:  Procedure Laterality Date  . HYSTEROSCOPY N/A 10/04/2015   Procedure: HYSTEROSCOPY with Removal IUD;  Surgeon: Vanessa Kick, MD;  Location: [facility];  Service: Gynecology;  Laterality: N/A;  . lasik     eye surgery  . WISDOM TOOTH EXTRACTION      SOCIAL HISTORY: Social History   Tobacco Use  . Smoking status: Former Smoker    Packs/day: 1.00    Years: 15.00    Pack years: 15.00    Last attempt to quit: 09/03/2001    Years since quitting: 16.6  . Smokeless tobacco: Never Used  Substance Use Topics  . Alcohol use: Yes    Comment: rare  . Drug use: No    FAMILY HISTORY: Family History  Problem Relation Age of Onset  . Heart attack Mother 39  . Hypertension Mother   . Heart disease Mother   . Obesity Mother   . Cancer Father        prostate  . Heart attack Sister 20  . Hypertension Other     ROS: Review of Systems  Constitutional: Negative for weight loss.  Gastrointestinal: Negative for nausea and vomiting.  Musculoskeletal:       Negative muscle weakness    PHYSICAL EXAM: Blood pressure 131/83, pulse 92, temperature 98.9 F (37.2 C), temperature source Oral, height 5\' 10"  (1.778 m), weight (!) 313 lb (142 kg), SpO2 100 %. Body mass index is 44.91 kg/m. Physical Exam  Constitutional: She is oriented to person, place, and time. She appears well-developed and well-nourished.  Cardiovascular:  Normal rate.  Pulmonary/Chest: Effort normal.  Musculoskeletal: Normal range of motion.  Neurological: She is oriented to person, place, and time.  Skin: Skin is warm and dry.  Psychiatric: She has a normal mood and affect. Her behavior is normal.  Vitals reviewed.   RECENT LABS AND TESTS: BMET    Component Value Date/Time   NA 139 03/13/2018 1152   NA 137 09/12/2017 0927   K 3.7 03/13/2018 1152   CL 103 03/13/2018 1152   CO2 29 03/13/2018 1152  GLUCOSE 86 03/13/2018 1152   BUN 9 03/13/2018 1152   BUN 10 09/12/2017 0927   CREATININE 0.60 03/13/2018 1152   CREATININE 0.72 02/03/2014 1648   CALCIUM 8.9 03/13/2018 1152   GFRNONAA 105 09/12/2017 0927   GFRAA 121 09/12/2017 0927   Lab Results  Component Value Date   HGBA1C 5.9 03/13/2018   HGBA1C 5.8 (H) 09/12/2017   HGBA1C 6.2 (H) 05/23/2017   HGBA1C 6.6 (H) 03/15/2017   HGBA1C 6.3 11/06/2016   Lab Results  Component Value Date   INSULIN 12.9 09/12/2017   INSULIN 31.1 (H) 05/23/2017   CBC    Component Value Date/Time   WBC 5.9 03/13/2018 1152   RBC 4.35 03/13/2018 1152   HGB 11.9 (L) 03/13/2018 1152   HGB 12.1 05/23/2017 1025   HCT 35.9 (L) 03/13/2018 1152   HCT 36.6 05/23/2017 1025   PLT 283.0 03/13/2018 1152   MCV 82.4 03/13/2018 1152   MCV 82 05/23/2017 1025   MCH 27.1 05/23/2017 1025   MCH 28.2 02/03/2014 1648   MCHC 33.2 03/13/2018 1152   RDW 15.0 03/13/2018 1152   RDW 14.9 05/23/2017 1025   LYMPHSABS 1.7 05/23/2017 1025   MONOABS 0.4 06/09/2014 0843   EOSABS 0.1 05/23/2017 1025   BASOSABS 0.0 05/23/2017 1025   Iron/TIBC/Ferritin/ %Sat No results found for: IRON, TIBC, FERRITIN, IRONPCTSAT Lipid Panel     Component Value Date/Time   CHOL 140 03/13/2018 1152   CHOL 132 09/12/2017 0927   TRIG 66.0 03/13/2018 1152   HDL 40.00 03/13/2018 1152   HDL 32 (L) 09/12/2017 0927   CHOLHDL 3 03/13/2018 1152   VLDL 13.2 03/13/2018 1152   LDLCALC 86 03/13/2018 1152   LDLCALC 81 09/12/2017 0927   Hepatic  Function Panel     Component Value Date/Time   PROT 7.0 03/13/2018 1152   PROT 6.9 09/12/2017 0927   ALBUMIN 4.0 03/13/2018 1152   ALBUMIN 4.1 09/12/2017 0927   AST 9 03/13/2018 1152   ALT 9 03/13/2018 1152   ALKPHOS 67 03/13/2018 1152   BILITOT 0.3 03/13/2018 1152   BILITOT 0.2 09/12/2017 0927   BILIDIR 0.0 06/09/2014 0843   IBILI 0.1 (L) 02/03/2014 1648      Component Value Date/Time   TSH 2.10 03/13/2018 1152   TSH 1.710 05/23/2017 1025   TSH 1.26 03/15/2017 1140  Results for JULENA, BARBOUR (MRN [id number]) as of 05/08/2018 17:23  Ref. Range 09/12/2017 09:27  Vitamin D, 25-Hydroxy Latest Ref Range: 30.0 - 100.0 ng/mL 27.9 (L)    ASSESSMENT AND PLAN: Vitamin D deficiency  Class 3 severe obesity with serious comorbidity and body mass index (BMI) of 40.0 to 44.9 in adult, unspecified obesity type (HCC)  PLAN:  Vitamin D Deficiency Brandi Lester was informed that low vitamin D levels contributes to fatigue and are associated with obesity, breast, and colon cancer. Brandi Lester agrees to continue taking prescription Vit D @50 ,000 IU every week and will follow up for routine testing of vitamin D, at least 2-3 times per year. She was informed of the risk of over-replacement of vitamin D and agrees to not increase her dose unless she discusses this with [name] first. Brandi Lester agrees to follow up with our clinic in  2 to 3 weeks.  I spent > than 50% of the 15 minute visit on counseling as documented in the note.  Obesity Brandi Lester is currently in the action stage of change. As such, her goal is to continue with weight loss efforts She has agreed  to keep a food journal with 1400-1500 calories and 95+ grams of protein daily Brandi Lester has been instructed to work up to a goal of 150 minutes of combined cardio and strengthening exercise per week for weight loss and overall health benefits. We discussed the following Behavioral Modification Strategies today: decrease eating out and work on meal planning and  easy cooking plans   Brandi Lester has agreed to follow up with our clinic in 2 to 3 weeks. She was informed of the importance of frequent follow up visits to maximize her success with intensive lifestyle modifications for her multiple health conditions.   OBESITY BEHAVIORAL INTERVENTION VISIT  Today's visit was # 21.  Starting weight: 325 lbs Starting date: 05/23/17 Today's weight : 313 lbs Today's date: 05/08/2018 Total lbs lost to date: 12    ASK: We discussed the diagnosis of obesity with Brandi Lester today and Brandi Lester agreed to give [name] permission to discuss obesity behavioral modification therapy today.  ASSESS: Brandi Lester has the diagnosis of obesity and her BMI today is 44.91 Brandi Lester is in the action stage of change   ADVISE: Brandi Lester was educated on the multiple health risks of obesity as well as the benefit of weight loss to improve her health. She was advised of the need for long term treatment and the importance of lifestyle modifications.  AGREE: Multiple dietary modification options and treatment options were discussed and  Brandi Lester agreed to the above obesity treatment plan.  Wilhemena Durie, am acting as transcriptionist for Abby Potash, PA-C I, Abby Potash, PA-C have reviewed above note and agree with its content

## 2018-05-23 MED FILL — VIT D2 1.25 MG (50,000 UNIT: 1.25 MG | 28 days supply | Qty: 4 | Fill #2

## 2018-05-23 MED FILL — POTASSIUM CL ER 20 MEQ TAB: 20 | 30 days supply | Qty: 120 | Fill #1

## 2018-05-23 MED FILL — metFORMIN HCL 500 MG TABS: 500 | 90 days supply | Qty: 90 | Fill #1

## 2018-05-23 MED FILL — MEDROXYPROGESTERONE 10 MG T: 10 | 30 days supply | Qty: 30 | Fill #5

## 2018-05-24 ENCOUNTER — Encounter: Payer: Self-pay | Admitting: Family Medicine

## 2018-05-28 ENCOUNTER — Ambulatory Visit (INDEPENDENT_AMBULATORY_CARE_PROVIDER_SITE_OTHER): Payer: BC Managed Care – PPO | Admitting: Physician Assistant

## 2018-05-28 VITALS — BP 133/83 | HR 91 | Temp 98.4°F | Ht 70.0 in | Wt 313.0 lb

## 2018-05-28 DIAGNOSIS — E559 Vitamin D deficiency, unspecified: Secondary | ICD-10-CM | POA: Diagnosis not present

## 2018-05-28 DIAGNOSIS — Z6841 Body Mass Index (BMI) 40.0 and over, adult: Secondary | ICD-10-CM | POA: Diagnosis not present

## 2018-05-29 NOTE — Progress Notes (Signed)
Office: [phone]  /  Fax: [phone]   HPI:   Chief Complaint: OBESITY Brandi Lester is here to discuss her progress with her obesity treatment plan. She is on the keep a food journal with 1400-1500 calories and 95+ grams of protein daily and is following her eating plan approximately 75-80 % of the time. She states she is walking for 30 minutes 4 times per week. [name] did well with weight maintenance. She reports indulging recently for her birthday celebration. She is ready to get back on track.  Her weight is (!) 313 lb (142 kg) today and has not lost weight since her last visit. She has lost 12 lbs since starting treatment with [name].  Vitamin D Deficiency Brandi Lester has a diagnosis of vitamin D deficiency. She is on prescription Vit D and denies nausea, vomiting or muscle weakness.  ALLERGIES: Allergies  Allergen Reactions  . Penicillins Itching and Swelling    Has patient had a PCN reaction causing immediate rash, facial/tongue/throat swelling, SOB or lightheadedness with hypotension: yes Has patient had a PCN reaction causing severe rash involving mucus membranes or skin necrosis: no Has patient had a PCN reaction that required hospitalization no Has patient had a PCN reaction occurring within the last 10 years: yes If all of the above answers are "NO", then may proceed with Cephalosporin use.   Brandi Lester [Liraglutide]     Vomiting    MEDICATIONS: Current Outpatient Medications on File Prior to Visit  Medication Sig Dispense Refill  . acetaminophen (TYLENOL 8 HOUR) 650 MG CR tablet Take 1 tablet (650 mg total) every 8 (eight) hours as needed by mouth for pain.    [name] amLODipine (NORVASC) 5 MG tablet TAKE 1 TABLET (5 MG TOTAL) BY MOUTH DAILY. 90 tablet 1  . aspirin EC 81 MG tablet Take 1 tablet (81 mg total) by mouth daily.    [name] azelastine (ASTELIN) 0.1 % nasal spray Place 2 sprays into both nostrils 2 (two) times daily. Use in each nostril as directed 90 mL 1  . Cyanocobalamin  (B-12) 1000 MCG CAPS Take by mouth daily.    [name] EPINEPHrine (EPIPEN 2-PAK) 0.3 mg/0.3 mL IJ SOAJ injection Use as directed for severe allergic reaction 2 Device 1  . fexofenadine (ALLEGRA ALLERGY) 180 MG tablet Take 1 tablet (180 mg total) by mouth daily. 301 tablet 1  . fluticasone (FLONASE) 50 MCG/ACT nasal spray Place 2 sprays into both nostrils daily. (Patient taking differently: Place 2 sprays into both nostrils daily as needed for allergies. ) 16 g 11  . glucose blood test strip OneTouch Ultra Test strips    . ibuprofen (ADVIL,MOTRIN) 600 MG tablet Take 1 tablet (600 mg total) by mouth every 6 (six) hours as needed. 90 tablet 0  . losartan-hydrochlorothiazide (HYZAAR) 100-25 MG tablet TAKE 1 TABLET BY MOUTH DAILY. 90 tablet 1  . metFORMIN (GLUCOPHAGE) 500 MG tablet TAKE 1 TABLET (500 MG TOTAL) BY MOUTH DAILY WITH BREAKFAST. 90 tablet 1  . montelukast (SINGULAIR) 10 MG tablet Take 1 tablet (10 mg total) by mouth at bedtime as needed. 90 tablet 1  . Omega-3 Fatty Acids (FISH OIL PO) Take 1 capsule by mouth daily.    Brandi Lester DELICA LANCETS 35W MISC OneTouch Delica Lancets 33 gauge    . pantoprazole (PROTONIX) 40 MG tablet Take 1 tablet (40 mg total) by mouth daily. (Patient taking differently: Take 40 mg by mouth every other day. ) 90 tablet 1  . potassium chloride SA (K-DUR,KLOR-CON) 20  MEQ tablet Take 1 tablet (20 mEq total) by mouth 3 (three) times daily. 120 tablet 3  . Probiotic Product (PROBIOTIC-10 PO) Take by mouth daily.    . TURMERIC PO Take 1 capsule by mouth daily.    . Vitamin D, Ergocalciferol, (DRISDOL) 50000 units CAPS capsule Take 1 capsule (50,000 Units total) by mouth every 7 (seven) days. 4 capsule 4   No current facility-administered medications on file prior to visit.     PAST MEDICAL HISTORY: Past Medical History:  Diagnosis Date  . ADD (attention deficit disorder)   . Allergy    allergic rhinitis  . Anemia 07/25/2013  . Angio-edema   . Back pain   .  Costochondritis 02/20/2015  . Diabetes mellitus    Type II   previously 3 years ago - no longer on meds   . Dyslipidemia 07/25/2013  . Fungus infection 10/13   Left great toe  . GERD (gastroesophageal reflux disease)   . Hypertension   . IBS (irritable bowel syndrome)   . Ingrown nail 10/13   right foot next to the last toe  . Insomnia 04/20/2013  . Joint pain   . Lactose intolerance   . Leg edema   . Obesity   . Pedal edema 02/20/2015  . Prediabetes   . Preventative health care 09/25/2015  . PVC (premature ventricular contraction)     PAST SURGICAL HISTORY: Past Surgical History:  Procedure Laterality Date  . HYSTEROSCOPY N/A 10/04/2015   Procedure: HYSTEROSCOPY with Removal IUD;  Surgeon: Vanessa Kick, MD;  Location: [facility];  Service: Gynecology;  Laterality: N/A;  . lasik     eye surgery  . WISDOM TOOTH EXTRACTION      SOCIAL HISTORY: Social History   Tobacco Use  . Smoking status: Former Smoker    Packs/day: 1.00    Years: 15.00    Pack years: 15.00    Last attempt to quit: 09/03/2001    Years since quitting: 16.7  . Smokeless tobacco: Never Used  Substance Use Topics  . Alcohol use: Yes    Comment: rare  . Drug use: No    FAMILY HISTORY: Family History  Problem Relation Age of Onset  . Heart attack Mother 58  . Hypertension Mother   . Heart disease Mother   . Obesity Mother   . Cancer Father        prostate  . Heart attack Sister 30  . Hypertension Other     ROS: Review of Systems  Constitutional: Negative for weight loss.  Gastrointestinal: Negative for nausea and vomiting.  Musculoskeletal:       Negative muscle weakness    PHYSICAL EXAM: Blood pressure 133/83, pulse 91, temperature 98.4 F (36.9 C), temperature source Oral, height 5\' 10"  (1.778 m), weight (!) 313 lb (142 kg), SpO2 98 %. Body mass index is 44.91 kg/m. Physical Exam  Constitutional: She is oriented to person, place, and time. She appears well-developed and well-nourished.    Cardiovascular: Normal rate.  Pulmonary/Chest: Effort normal.  Musculoskeletal: Normal range of motion.  Neurological: She is oriented to person, place, and time.  Skin: Skin is warm and dry.  Psychiatric: She has a normal mood and affect. Her behavior is normal.  Vitals reviewed.   RECENT LABS AND TESTS: BMET    Component Value Date/Time   NA 139 03/13/2018 1152   NA 137 09/12/2017 0927   K 3.7 03/13/2018 1152   CL 103 03/13/2018 1152   CO2 29 03/13/2018 1152  GLUCOSE 86 03/13/2018 1152   BUN 9 03/13/2018 1152   BUN 10 09/12/2017 0927   CREATININE 0.60 03/13/2018 1152   CREATININE 0.72 02/03/2014 1648   CALCIUM 8.9 03/13/2018 1152   GFRNONAA 105 09/12/2017 0927   GFRAA 121 09/12/2017 0927   Lab Results  Component Value Date   HGBA1C 5.9 03/13/2018   HGBA1C 5.8 (H) 09/12/2017   HGBA1C 6.2 (H) 05/23/2017   HGBA1C 6.6 (H) 03/15/2017   HGBA1C 6.3 11/06/2016   Lab Results  Component Value Date   INSULIN 12.9 09/12/2017   INSULIN 31.1 (H) 05/23/2017   CBC    Component Value Date/Time   WBC 5.9 03/13/2018 1152   RBC 4.35 03/13/2018 1152   HGB 11.9 (L) 03/13/2018 1152   HGB 12.1 05/23/2017 1025   HCT 35.9 (L) 03/13/2018 1152   HCT 36.6 05/23/2017 1025   PLT 283.0 03/13/2018 1152   MCV 82.4 03/13/2018 1152   MCV 82 05/23/2017 1025   MCH 27.1 05/23/2017 1025   MCH 28.2 02/03/2014 1648   MCHC 33.2 03/13/2018 1152   RDW 15.0 03/13/2018 1152   RDW 14.9 05/23/2017 1025   LYMPHSABS 1.7 05/23/2017 1025   MONOABS 0.4 06/09/2014 0843   EOSABS 0.1 05/23/2017 1025   BASOSABS 0.0 05/23/2017 1025   Iron/TIBC/Ferritin/ %Sat No results found for: IRON, TIBC, FERRITIN, IRONPCTSAT Lipid Panel     Component Value Date/Time   CHOL 140 03/13/2018 1152   CHOL 132 09/12/2017 0927   TRIG 66.0 03/13/2018 1152   HDL 40.00 03/13/2018 1152   HDL 32 (L) 09/12/2017 0927   CHOLHDL 3 03/13/2018 1152   VLDL 13.2 03/13/2018 1152   LDLCALC 86 03/13/2018 1152   LDLCALC 81  09/12/2017 0927   Hepatic Function Panel     Component Value Date/Time   PROT 7.0 03/13/2018 1152   PROT 6.9 09/12/2017 0927   ALBUMIN 4.0 03/13/2018 1152   ALBUMIN 4.1 09/12/2017 0927   AST 9 03/13/2018 1152   ALT 9 03/13/2018 1152   ALKPHOS 67 03/13/2018 1152   BILITOT 0.3 03/13/2018 1152   BILITOT 0.2 09/12/2017 0927   BILIDIR 0.0 06/09/2014 0843   IBILI 0.1 (L) 02/03/2014 1648      Component Value Date/Time   TSH 2.10 03/13/2018 1152   TSH 1.710 05/23/2017 1025   TSH 1.26 03/15/2017 1140  Results for EMY, ANGEVINE (MRN [id number]) as of 05/29/2018 09:10  Ref. Range 09/12/2017 09:27  Vitamin D, 25-Hydroxy Latest Ref Range: 30.0 - 100.0 ng/mL 27.9 (L)    ASSESSMENT AND PLAN: Vitamin D deficiency  Class 3 severe obesity with serious comorbidity and body mass index (BMI) of 40.0 to 44.9 in adult, unspecified obesity type (HCC)  PLAN:  Vitamin D Deficiency Cartina was informed that low vitamin D levels contributes to fatigue and are associated with obesity, breast, and colon cancer. Harryette agrees to continue taking prescription Vit D @50 ,000 IU every week and will follow up for routine testing of vitamin D, at least 2-3 times per year. She was informed of the risk of over-replacement of vitamin D and agrees to not increase her dose unless she discusses this with [name] first. Darcy agrees to follow up with our clinic in 2 weeks.  I spent > than 50% of the 15 minute visit on counseling as documented in the note.  Obesity Milka is currently in the action stage of change. As such, her goal is to continue with weight loss efforts She has agreed to keep a  food journal with 1400-1500 calories and 95+ grams of protein daily Kamali has been instructed to work up to a goal of 150 minutes of combined cardio and strengthening exercise per week for weight loss and overall health benefits. We discussed the following Behavioral Modification Strategies today: work on meal planning and  easy cooking plans and planning for success   Kaisy has agreed to follow up with our clinic in 2 weeks. She was informed of the importance of frequent follow up visits to maximize her success with intensive lifestyle modifications for her multiple health conditions.   OBESITY BEHAVIORAL INTERVENTION VISIT  Today's visit was # 22   Starting weight: 325 lbs Starting date: 05/23/17 Today's weight : 313 lbs  Today's date: 05/28/2018 Total lbs lost to date: 12    ASK: We discussed the diagnosis of obesity with [name] today and Kyaira agreed to give [name] permission to discuss obesity behavioral modification therapy today.  ASSESS: Cherika has the diagnosis of obesity and her BMI today is 44.91 Lorianna is in the action stage of change   ADVISE: Heloise was educated on the multiple health risks of obesity as well as the benefit of weight loss to improve her health. She was advised of the need for long term treatment and the importance of lifestyle modifications.  AGREE: Multiple dietary modification options and treatment options were discussed and  Aigner agreed to the above obesity treatment plan.  Wilhemena Durie, am acting as transcriptionist for Abby Potash, PA-C I, Abby Potash, PA-C have reviewed above note and agree with its content

## 2018-06-09 ENCOUNTER — Encounter: Payer: Self-pay | Admitting: Family Medicine

## 2018-06-09 ENCOUNTER — Other Ambulatory Visit: Payer: Self-pay | Admitting: Obstetrics & Gynecology

## 2018-06-09 DIAGNOSIS — Z1231 Encounter for screening mammogram for malignant neoplasm of breast: Secondary | ICD-10-CM

## 2018-06-09 DIAGNOSIS — I10 Essential (primary) hypertension: Secondary | ICD-10-CM

## 2018-06-10 MED ORDER — LOSARTAN POTASSIUM 100 MG PO TABS: 100.0000 mg | ORAL_TABLET | Freq: Every day | ORAL | 1 refills | Status: DC

## 2018-06-10 MED ORDER — HYDROCHLOROTHIAZIDE 25 MG PO TABS: 25.0000 mg | ORAL_TABLET | Freq: Every day | ORAL | 1 refills | Status: DC

## 2018-06-11 ENCOUNTER — Ambulatory Visit (INDEPENDENT_AMBULATORY_CARE_PROVIDER_SITE_OTHER): Payer: BC Managed Care – PPO | Admitting: Physician Assistant

## 2018-06-11 ENCOUNTER — Encounter (INDEPENDENT_AMBULATORY_CARE_PROVIDER_SITE_OTHER): Payer: Self-pay | Admitting: Physician Assistant

## 2018-06-11 VITALS — BP 135/80 | HR 84 | Temp 99.4°F | Ht 70.0 in | Wt 311.0 lb

## 2018-06-11 DIAGNOSIS — E559 Vitamin D deficiency, unspecified: Secondary | ICD-10-CM

## 2018-06-11 DIAGNOSIS — Z6841 Body Mass Index (BMI) 40.0 and over, adult: Secondary | ICD-10-CM | POA: Diagnosis not present

## 2018-06-12 NOTE — Progress Notes (Signed)
Office: [phone]  /  Fax: [phone]   HPI:   Chief Complaint: OBESITY Brandi Lester is here to discuss her progress with her obesity treatment plan. She is keeping a food journal with 1400 to 1500 calories and 95 grams of protein and is following her eating plan approximately 80 % of the time. She states she is walking 30 minutes 3 times per week. [name] did very well with weight loss. She reports that she is concerned with Halloween coming up. She is attempting to avoid candy as best as she can.  Her weight is (!) 311 lb (141.1 kg) today and has had a weight loss of 2 pounds over a period of 2 weeks since her last visit. She has lost 14 lbs since starting treatment with [name].  Vitamin D deficiency [name] has a diagnosis of vitamin D deficiency. She is currently taking vit D and denies nausea, vomiting or muscle weakness.  ALLERGIES: Allergies  Allergen Reactions  . Penicillins Itching and Swelling    Has patient had a PCN reaction causing immediate rash, facial/tongue/throat swelling, SOB or lightheadedness with hypotension: yes Has patient had a PCN reaction causing severe rash involving mucus membranes or skin necrosis: no Has patient had a PCN reaction that required hospitalization no Has patient had a PCN reaction occurring within the last 10 years: yes If all of the above answers are "NO", then may proceed with Cephalosporin use.   [name] [Liraglutide]     Vomiting    MEDICATIONS: Current Outpatient Medications on File Prior to Visit  Medication Sig Dispense Refill  . acetaminophen (TYLENOL 8 HOUR) 650 MG CR tablet Take 1 tablet (650 mg total) every 8 (eight) hours as needed by mouth for pain.    [name] amLODipine (NORVASC) 5 MG tablet TAKE 1 TABLET (5 MG TOTAL) BY MOUTH DAILY. 90 tablet 1  . aspirin EC 81 MG tablet Take 1 tablet (81 mg total) by mouth daily.    [name] azelastine (ASTELIN) 0.1 % nasal spray Place 2 sprays into both nostrils 2 (two) times daily. Use in each  nostril as directed 90 mL 1  . Cyanocobalamin (B-12) 1000 MCG CAPS Take by mouth daily.    [name] EPINEPHrine (EPIPEN 2-PAK) 0.3 mg/0.3 mL IJ SOAJ injection Use as directed for severe allergic reaction 2 Device 1  . fexofenadine (ALLEGRA ALLERGY) 180 MG tablet Take 1 tablet (180 mg total) by mouth daily. 301 tablet 1  . fluticasone (FLONASE) 50 MCG/ACT nasal spray Place 2 sprays into both nostrils daily. (Patient taking differently: Place 2 sprays into both nostrils daily as needed for allergies. ) 16 g 11  . glucose blood test strip OneTouch Ultra Test strips    . hydrochlorothiazide (HYDRODIURIL) 25 MG tablet Take 1 tablet (25 mg total) by mouth daily. 30 tablet 1  . ibuprofen (ADVIL,MOTRIN) 600 MG tablet Take 1 tablet (600 mg total) by mouth every 6 (six) hours as needed. 90 tablet 0  . losartan (COZAAR) 100 MG tablet Take 1 tablet (100 mg total) by mouth daily. 30 tablet 1  . losartan-hydrochlorothiazide (HYZAAR) 100-25 MG tablet TAKE 1 TABLET BY MOUTH DAILY. 90 tablet 1  . metFORMIN (GLUCOPHAGE) 500 MG tablet TAKE 1 TABLET (500 MG TOTAL) BY MOUTH DAILY WITH BREAKFAST. 90 tablet 1  . montelukast (SINGULAIR) 10 MG tablet Take 1 tablet (10 mg total) by mouth at bedtime as needed. 90 tablet 1  . Omega-3 Fatty Acids (FISH OIL PO) Take 1 capsule by mouth daily.    [name]  ONETOUCH DELICA LANCETS 95G MISC OneTouch Delica Lancets 33 gauge    . pantoprazole (PROTONIX) 40 MG tablet Take 1 tablet (40 mg total) by mouth daily. (Patient taking differently: Take 40 mg by mouth every other day. ) 90 tablet 1  . potassium chloride SA (K-DUR,KLOR-CON) 20 MEQ tablet Take 1 tablet (20 mEq total) by mouth 3 (three) times daily. 120 tablet 3  . Probiotic Product (PROBIOTIC-10 PO) Take by mouth daily.    . TURMERIC PO Take 1 capsule by mouth daily.    . Vitamin D, Ergocalciferol, (DRISDOL) 50000 units CAPS capsule Take 1 capsule (50,000 Units total) by mouth every 7 (seven) days. 4 capsule 4   No current  facility-administered medications on file prior to visit.     PAST MEDICAL HISTORY: Past Medical History:  Diagnosis Date  . ADD (attention deficit disorder)   . Allergy    allergic rhinitis  . Anemia 07/25/2013  . Angio-edema   . Back pain   . Costochondritis 02/20/2015  . Diabetes mellitus    Type II   previously 3 years ago - no longer on meds   . Dyslipidemia 07/25/2013  . Fungus infection 10/13   Left great toe  . GERD (gastroesophageal reflux disease)   . Hypertension   . IBS (irritable bowel syndrome)   . Ingrown nail 10/13   right foot next to the last toe  . Insomnia 04/20/2013  . Joint pain   . Lactose intolerance   . Leg edema   . Obesity   . Pedal edema 02/20/2015  . Prediabetes   . Preventative health care 09/25/2015  . PVC (premature ventricular contraction)     PAST SURGICAL HISTORY: Past Surgical History:  Procedure Laterality Date  . HYSTEROSCOPY N/A 10/04/2015   Procedure: HYSTEROSCOPY with Removal IUD;  Surgeon: Vanessa Kick, MD;  Location: [facility];  Service: Gynecology;  Laterality: N/A;  . lasik     eye surgery  . WISDOM TOOTH EXTRACTION      SOCIAL HISTORY: Social History   Tobacco Use  . Smoking status: Former Smoker    Packs/day: 1.00    Years: 15.00    Pack years: 15.00    Last attempt to quit: 09/03/2001    Years since quitting: 16.7  . Smokeless tobacco: Never Used  Substance Use Topics  . Alcohol use: Yes    Comment: rare  . Drug use: No    FAMILY HISTORY: Family History  Problem Relation Age of Onset  . Heart attack Mother 77  . Hypertension Mother   . Heart disease Mother   . Obesity Mother   . Cancer Father        prostate  . Heart attack Sister 99  . Hypertension Other     ROS: Review of Systems  Constitutional: Positive for weight loss.  Gastrointestinal: Negative for nausea and vomiting.  Musculoskeletal:       Negative for muscle weakness.    PHYSICAL EXAM: Blood pressure 135/80, pulse 84, temperature 99.4 F  (37.4 C), temperature source Oral, height 5\' 10"  (1.778 m), weight (!) 311 lb (141.1 kg), SpO2 96 %. Body mass index is 44.62 kg/m. Physical Exam  Constitutional: She is oriented to person, place, and time. She appears well-developed and well-nourished.  Cardiovascular: Normal rate.  Pulmonary/Chest: Effort normal.  Musculoskeletal: Normal range of motion.  Neurological: She is oriented to person, place, and time.  Skin: Skin is warm and dry.  Psychiatric: She has a normal mood and affect.  Her behavior is normal.    RECENT LABS AND TESTS: BMET    Component Value Date/Time   NA 139 03/13/2018 1152   NA 137 09/12/2017 0927   K 3.7 03/13/2018 1152   CL 103 03/13/2018 1152   CO2 29 03/13/2018 1152   GLUCOSE 86 03/13/2018 1152   BUN 9 03/13/2018 1152   BUN 10 09/12/2017 0927   CREATININE 0.60 03/13/2018 1152   CREATININE 0.72 02/03/2014 1648   CALCIUM 8.9 03/13/2018 1152   GFRNONAA 105 09/12/2017 0927   GFRAA 121 09/12/2017 0927   Lab Results  Component Value Date   HGBA1C 5.9 03/13/2018   HGBA1C 5.8 (H) 09/12/2017   HGBA1C 6.2 (H) 05/23/2017   HGBA1C 6.6 (H) 03/15/2017   HGBA1C 6.3 11/06/2016   Lab Results  Component Value Date   INSULIN 12.9 09/12/2017   INSULIN 31.1 (H) 05/23/2017   CBC    Component Value Date/Time   WBC 5.9 03/13/2018 1152   RBC 4.35 03/13/2018 1152   HGB 11.9 (L) 03/13/2018 1152   HGB 12.1 05/23/2017 1025   HCT 35.9 (L) 03/13/2018 1152   HCT 36.6 05/23/2017 1025   PLT 283.0 03/13/2018 1152   MCV 82.4 03/13/2018 1152   MCV 82 05/23/2017 1025   MCH 27.1 05/23/2017 1025   MCH 28.2 02/03/2014 1648   MCHC 33.2 03/13/2018 1152   RDW 15.0 03/13/2018 1152   RDW 14.9 05/23/2017 1025   LYMPHSABS 1.7 05/23/2017 1025   MONOABS 0.4 06/09/2014 0843   EOSABS 0.1 05/23/2017 1025   BASOSABS 0.0 05/23/2017 1025   Iron/TIBC/Ferritin/ %Sat No results found for: IRON, TIBC, FERRITIN, IRONPCTSAT Lipid Panel     Component Value Date/Time   CHOL 140  03/13/2018 1152   CHOL 132 09/12/2017 0927   TRIG 66.0 03/13/2018 1152   HDL 40.00 03/13/2018 1152   HDL 32 (L) 09/12/2017 0927   CHOLHDL 3 03/13/2018 1152   VLDL 13.2 03/13/2018 1152   LDLCALC 86 03/13/2018 1152   LDLCALC 81 09/12/2017 0927   Hepatic Function Panel     Component Value Date/Time   PROT 7.0 03/13/2018 1152   PROT 6.9 09/12/2017 0927   ALBUMIN 4.0 03/13/2018 1152   ALBUMIN 4.1 09/12/2017 0927   AST 9 03/13/2018 1152   ALT 9 03/13/2018 1152   ALKPHOS 67 03/13/2018 1152   BILITOT 0.3 03/13/2018 1152   BILITOT 0.2 09/12/2017 0927   BILIDIR 0.0 06/09/2014 0843   IBILI 0.1 (L) 02/03/2014 1648      Component Value Date/Time   TSH 2.10 03/13/2018 1152   TSH 1.710 05/23/2017 1025   TSH 1.26 03/15/2017 1140   Results for THERESSA, PIEDRA (MRN [id number]) as of 06/12/2018 17:38  Ref. Range 03/13/2018 11:52  VITD Latest Ref Range: 30.00 - 100.00 ng/mL 25.85 (L)   ASSESSMENT AND PLAN: Vitamin D deficiency  Class 3 severe obesity with serious comorbidity and body mass index (BMI) of 40.0 to 44.9 in adult, unspecified obesity type (HCC)  PLAN:  Vitamin D Deficiency Bernis was informed that low vitamin D levels contributes to fatigue and are associated with obesity, breast, and colon cancer. She agrees to continue to take prescription Vit D @50 ,000 IU every week and will follow up for routine testing of vitamin D, at least 2-3 times per year. She was informed of the risk of over-replacement of vitamin D and agrees to not increase her dose unless she discusses this with [name] first. She agrees to follow up in 2 weeks.  I spent > than 50% of the 15 minute visit on counseling as documented in the note.  Obesity Tyniya is currently in the action stage of change. As such, her goal is to continue with weight loss efforts. She has agreed to keep a food journal with 1400 to 1500 calories and 95 grams of protein daily. Metztli has been instructed to work up to a goal of 150  minutes of combined cardio and strengthening exercise per week for weight loss and overall health benefits. We discussed the following Behavioral Modification Strategies today: work on meal planning and easy cooking plans and keeping healthy foods in the home. She was given information on Halloween Strategies.  Shiesha has agreed to follow up with our clinic in 2 weeks. She was informed of the importance of frequent follow up visits to maximize her success with intensive lifestyle modifications for her multiple health conditions.   OBESITY BEHAVIORAL INTERVENTION VISIT  Today's visit was # 23  Starting weight: 325 Starting date: 05/23/17 Today's weight : Weight: (!) 311 lb (141.1 kg)  Today's date: 06/11/2018 Total lbs lost to date: 14  ASK: We discussed the diagnosis of obesity with [name] today and Nicola agreed to give [name] permission to discuss obesity behavioral modification therapy today.  ASSESS: Jakaila has the diagnosis of obesity and her BMI today is 44.62. Zoa is in the action stage of change.   ADVISE: Gini was educated on the multiple health risks of obesity as well as the benefit of weight loss to improve her health. She was advised of the need for long term treatment and the importance of lifestyle modifications to improve her current health and to decrease her risk of future health problems.  AGREE: Multiple dietary modification options and treatment options were discussed and Teghan agreed to follow the recommendations documented in the above note.  ARRANGE: Abigaelle was educated on the importance of frequent visits to treat obesity as outlined per CMS and USPSTF guidelines and agreed to schedule her next follow up appointment today.  Lenward Chancellor, am acting as transcriptionist for Abby Potash, PA-C I, Abby Potash, PA-C have reviewed above note and agree with its content

## 2018-06-15 ENCOUNTER — Encounter: Payer: Self-pay | Admitting: Family Medicine

## 2018-06-15 DIAGNOSIS — I10 Essential (primary) hypertension: Secondary | ICD-10-CM

## 2018-06-17 MED FILL — LOSARTAN-HCTZ 100-25 MG TAB: 100-25 | 90 days supply | Qty: 90 | Fill #1

## 2018-06-17 MED FILL — MEDROXYPROGESTERONE 10 MG T: 10 | 30 days supply | Qty: 30 | Fill #6

## 2018-06-17 MED FILL — VIT D2 1.25 MG (50,000 UNIT: 1.25 MG | 28 days supply | Qty: 4 | Fill #3

## 2018-06-26 ENCOUNTER — Ambulatory Visit (INDEPENDENT_AMBULATORY_CARE_PROVIDER_SITE_OTHER): Payer: BC Managed Care – PPO | Admitting: Family Medicine

## 2018-06-26 ENCOUNTER — Encounter (INDEPENDENT_AMBULATORY_CARE_PROVIDER_SITE_OTHER): Payer: Self-pay | Admitting: Family Medicine

## 2018-06-26 VITALS — BP 129/78 | HR 87 | Temp 98.2°F | Ht 70.0 in | Wt 312.0 lb

## 2018-06-26 DIAGNOSIS — F32A Depression, unspecified: Secondary | ICD-10-CM | POA: Insufficient documentation

## 2018-06-26 DIAGNOSIS — F3289 Other specified depressive episodes: Secondary | ICD-10-CM | POA: Diagnosis not present

## 2018-06-26 DIAGNOSIS — F329 Major depressive disorder, single episode, unspecified: Secondary | ICD-10-CM | POA: Insufficient documentation

## 2018-06-26 DIAGNOSIS — E119 Type 2 diabetes mellitus without complications: Secondary | ICD-10-CM

## 2018-06-26 DIAGNOSIS — Z6841 Body Mass Index (BMI) 40.0 and over, adult: Secondary | ICD-10-CM

## 2018-06-26 DIAGNOSIS — E559 Vitamin D deficiency, unspecified: Secondary | ICD-10-CM

## 2018-06-26 NOTE — Progress Notes (Addendum)
Office: [phone]  /  Fax: [phone]   HPI:   Chief Complaint: OBESITY Brandi Lester is here to discuss her progress with her obesity treatment plan. She is keeping a food journal with 1400-1500 calories and 95 grams of protein and is following her eating plan approximately 85% of the time. She states she is walking for 30 minutes 3 times per week. [name] is currently struggling with meal planning for dinner. 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 She does report meeting her protein goal daily. She feels that she has lost the motivation that she had when she first started our program.  Her weight is (!) 312 lb (141.5 kg) today and has not lost weight since her last visit. She has lost 13 lbs since starting treatment with Brandi Lester.  Diabetes II [name] has a diagnosis of diabetes type II. [name] states fasting blood sugars are between 100-120  and she denies any hypoglycemic episodes. Last A1c was Hemoglobin A1C Latest Ref Rng & Units 03/13/2018 09/12/2017  HGBA1C 4.6 - 6.5 % 5.9 5.8(H)  Some recent data might be hidden  She is currently taking metformin and tolerating this well. Allie denies polyphagia.  She has been working on intensive lifestyle modifications including diet, exercise, and weight loss to help control her blood glucose levels.  Vitamin D deficiency [name] has a diagnosis of vitamin D deficiency. She is currently taking vit D and denies nausea, vomiting or muscle weakness.  Depression with emotional eating behaviors [name] is struggling with emotional eating and using food for comfort to the extent that it is negatively impacting her health. She under a great deal of stress as she works full time and is also going to school for a certification in "gifted" education.  She often snacks when she is not hungry. [name] sometimes feels she is out of control and then feels guilty that she made poor food choices. She has been  working on behavior modification techniques to help reduce her emotional eating and has been somewhat successful. She shows no sign of suicidal or homicidal ideations.  Depression screen [facility] 2/9 05/23/2017 07/03/2016 04/13/2016  Decreased Interest 2 0 0  Down, Depressed, Hopeless 1 0 0  PHQ - 2 Score 3 0 0  Altered sleeping 1 - -  Tired, decreased energy 1 - -  Change in appetite 1 - -  Feeling bad or failure about yourself  0 - -  Trouble concentrating 1 - -  Moving slowly or fidgety/restless 0 - -  Suicidal thoughts 0 - -  PHQ-9 Score 7 - -  Difficult doing work/chores Not difficult at all - -       ALLERGIES: Allergies  Allergen Reactions  . Penicillins Itching and Swelling    Has patient had a PCN reaction causing immediate rash, facial/tongue/throat swelling, SOB or lightheadedness with hypotension: yes Has patient had a PCN reaction causing severe rash involving mucus membranes or skin necrosis: no Has patient had a PCN reaction that required hospitalization no Has patient had a PCN reaction occurring within the last 10 years: yes If all of the above answers are "NO", then may proceed with Cephalosporin use.   [name] [Liraglutide]     Vomiting    MEDICATIONS: Current Outpatient Medications on File Prior to Visit  Medication Sig Dispense Refill  . acetaminophen (TYLENOL 8 HOUR) 650 MG CR tablet Take 1 tablet (650 mg total) every 8 (eight)  hours as needed by mouth for pain.    [name] amLODipine (NORVASC) 5 MG tablet TAKE 1 TABLET (5 MG TOTAL) BY MOUTH DAILY. 90 tablet 1  . aspirin EC 81 MG tablet Take 1 tablet (81 mg total) by mouth daily.    [name] azelastine (ASTELIN) 0.1 % nasal spray Place 2 sprays into both nostrils 2 (two) times daily. Use in each nostril as directed 90 mL 1  . Cyanocobalamin (B-12) 1000 MCG CAPS Take by mouth daily.    [name] EPINEPHrine (EPIPEN 2-PAK) 0.3 mg/0.3 mL IJ SOAJ injection Use as directed for severe allergic reaction 2 Device 1  . fexofenadine  (ALLEGRA ALLERGY) 180 MG tablet Take 1 tablet (180 mg total) by mouth daily. 301 tablet 1  . fluticasone (FLONASE) 50 MCG/ACT nasal spray Place 2 sprays into both nostrils daily. (Patient taking differently: Place 2 sprays into both nostrils daily as needed for allergies. ) 16 g 11  . glucose blood test strip OneTouch Ultra Test strips    . hydrochlorothiazide (HYDRODIURIL) 25 MG tablet Take 1 tablet (25 mg total) by mouth daily. 30 tablet 1  . ibuprofen (ADVIL,MOTRIN) 600 MG tablet Take 1 tablet (600 mg total) by mouth every 6 (six) hours as needed. 90 tablet 0  . losartan (COZAAR) 100 MG tablet Take 1 tablet (100 mg total) by mouth daily. 30 tablet 1  . losartan-hydrochlorothiazide (HYZAAR) 100-25 MG tablet TAKE 1 TABLET BY MOUTH DAILY. 90 tablet 1  . metFORMIN (GLUCOPHAGE) 500 MG tablet TAKE 1 TABLET (500 MG TOTAL) BY MOUTH DAILY WITH BREAKFAST. 90 tablet 1  . montelukast (SINGULAIR) 10 MG tablet Take 1 tablet (10 mg total) by mouth at bedtime as needed. 90 tablet 1  . Omega-3 Fatty Acids (FISH OIL PO) Take 1 capsule by mouth daily.    [name] DELICA LANCETS 70J MISC OneTouch Delica Lancets 33 gauge    . pantoprazole (PROTONIX) 40 MG tablet Take 1 tablet (40 mg total) by mouth daily. (Patient taking differently: Take 40 mg by mouth every other day. ) 90 tablet 1  . potassium chloride SA (K-DUR,KLOR-CON) 20 MEQ tablet Take 1 tablet (20 mEq total) by mouth 3 (three) times daily. 120 tablet 3  . Probiotic Product (PROBIOTIC-10 PO) Take by mouth daily.    . TURMERIC PO Take 1 capsule by mouth daily.    . Vitamin D, Ergocalciferol, (DRISDOL) 50000 units CAPS capsule Take 1 capsule (50,000 Units total) by mouth every 7 (seven) days. 4 capsule 4   No current facility-administered medications on file prior to visit.     PAST MEDICAL HISTORY: Past Medical History:  Diagnosis Date  . ADD (attention deficit disorder)   . Allergy    allergic rhinitis  . Anemia 07/25/2013  . Angio-edema   . Back  pain   . Costochondritis 02/20/2015  . Diabetes mellitus    Type II   previously 3 years ago - no longer on meds   . Dyslipidemia 07/25/2013  . Fungus infection 10/13   Left great toe  . GERD (gastroesophageal reflux disease)   . Hypertension   . IBS (irritable bowel syndrome)   . Ingrown nail 10/13   right foot next to the last toe  . Insomnia 04/20/2013  . Joint pain   . Lactose intolerance   . Leg edema   . Obesity   . Pedal edema 02/20/2015  . Prediabetes   . Preventative health care 09/25/2015  . PVC (premature ventricular contraction)  PAST SURGICAL HISTORY: Past Surgical History:  Procedure Laterality Date  . HYSTEROSCOPY N/A 10/04/2015   Procedure: HYSTEROSCOPY with Removal IUD;  Surgeon: Vanessa Kick, MD;  Location: [facility];  Service: Gynecology;  Laterality: N/A;  . lasik     eye surgery  . WISDOM TOOTH EXTRACTION      SOCIAL HISTORY: Social History   Tobacco Use  . Smoking status: Former Smoker    Packs/day: 1.00    Years: 15.00    Pack years: 15.00    Last attempt to quit: 09/03/2001    Years since quitting: 16.8  . Smokeless tobacco: Never Used  Substance Use Topics  . Alcohol use: Yes    Comment: rare  . Drug use: No    FAMILY HISTORY: Family History  Problem Relation Age of Onset  . Heart attack Mother 66  . Hypertension Mother   . Heart disease Mother   . Obesity Mother   . Cancer Father        prostate  . Heart attack Sister 67  . Hypertension Other     ROS: Review of Systems  Constitutional: Negative for weight loss.  Gastrointestinal: Negative for nausea and vomiting.  Neurological: Negative for weakness.  Endo/Heme/Allergies:       Negative for polyphagia.    PHYSICAL EXAM: Blood pressure 129/78, pulse 87, temperature 98.2 F (36.8 C), temperature source Oral, height 5\' 10"  (1.778 m), weight (!) 312 lb (141.5 kg), SpO2 99 %. Body mass index is 44.77 kg/m. Physical Exam  Constitutional: She is oriented to person, place, and  time. She appears well-developed and well-nourished.  Cardiovascular: Normal rate.  Pulmonary/Chest: Effort normal.  Musculoskeletal: Normal range of motion.  Neurological: She is alert and oriented to person, place, and time.  Skin: Skin is warm and dry.  Psychiatric: She has a normal mood and affect. Her behavior is normal.  Vitals reviewed.   RECENT LABS AND TESTS: BMET    Component Value Date/Time   NA 139 03/13/2018 1152   NA 137 09/12/2017 0927   K 3.7 03/13/2018 1152   CL 103 03/13/2018 1152   CO2 29 03/13/2018 1152   GLUCOSE 86 03/13/2018 1152   BUN 9 03/13/2018 1152   BUN 10 09/12/2017 0927   CREATININE 0.60 03/13/2018 1152   CREATININE 0.72 02/03/2014 1648   CALCIUM 8.9 03/13/2018 1152   GFRNONAA 105 09/12/2017 0927   GFRAA 121 09/12/2017 0927   Lab Results  Component Value Date   HGBA1C 5.9 03/13/2018   HGBA1C 5.8 (H) 09/12/2017   HGBA1C 6.2 (H) 05/23/2017   HGBA1C 6.6 (H) 03/15/2017   HGBA1C 6.3 11/06/2016   Lab Results  Component Value Date   INSULIN 12.9 09/12/2017   INSULIN 31.1 (H) 05/23/2017   CBC    Component Value Date/Time   WBC 5.9 03/13/2018 1152   RBC 4.35 03/13/2018 1152   HGB 11.9 (L) 03/13/2018 1152   HGB 12.1 05/23/2017 1025   HCT 35.9 (L) 03/13/2018 1152   HCT 36.6 05/23/2017 1025   PLT 283.0 03/13/2018 1152   MCV 82.4 03/13/2018 1152   MCV 82 05/23/2017 1025   MCH 27.1 05/23/2017 1025   MCH 28.2 02/03/2014 1648   MCHC 33.2 03/13/2018 1152   RDW 15.0 03/13/2018 1152   RDW 14.9 05/23/2017 1025   LYMPHSABS 1.7 05/23/2017 1025   MONOABS 0.4 06/09/2014 0843   EOSABS 0.1 05/23/2017 1025   BASOSABS 0.0 05/23/2017 1025   Iron/TIBC/Ferritin/ %Sat No results found for: IRON, TIBC,  FERRITIN, IRONPCTSAT Lipid Panel     Component Value Date/Time   CHOL 140 03/13/2018 1152   CHOL 132 09/12/2017 0927   TRIG 66.0 03/13/2018 1152   HDL 40.00 03/13/2018 1152   HDL 32 (L) 09/12/2017 0927   CHOLHDL 3 03/13/2018 1152   VLDL 13.2  03/13/2018 1152   LDLCALC 86 03/13/2018 1152   LDLCALC 81 09/12/2017 0927   Hepatic Function Panel     Component Value Date/Time   PROT 7.0 03/13/2018 1152   PROT 6.9 09/12/2017 0927   ALBUMIN 4.0 03/13/2018 1152   ALBUMIN 4.1 09/12/2017 0927   AST 9 03/13/2018 1152   ALT 9 03/13/2018 1152   ALKPHOS 67 03/13/2018 1152   BILITOT 0.3 03/13/2018 1152   BILITOT 0.2 09/12/2017 0927   BILIDIR 0.0 06/09/2014 0843   IBILI 0.1 (L) 02/03/2014 1648      Component Value Date/Time   TSH 2.10 03/13/2018 1152   TSH 1.710 05/23/2017 1025   TSH 1.26 03/15/2017 1140  Results for ANIESA, BOBACK (MRN [id number]) as of 06/26/2018 15:53  Ref. Range 03/13/2018 11:52  VITD Latest Ref Range: 30.00 - 100.00 ng/mL 25.85 (L)   ASSESSMENT AND PLAN: Type 2 diabetes mellitus without complication, without long-term current use of insulin (HCC)  Vitamin D deficiency  Other depression - with emotional eating  Class 3 severe obesity with serious comorbidity and body mass index (BMI) of 40.0 to 44.9 in adult, unspecified obesity type ([place])  PLAN:  Diabetes II Emmary has been given extensive diabetes education by myself today including ideal fasting and post-prandial blood glucose readings, individual ideal HgA1c goals  and hypoglycemia prevention. We discussed the importance of good blood sugar control to decrease the likelihood of diabetic complications such as nephropathy, neuropathy, limb loss, blindness, coronary artery disease, and death. We discussed the importance of intensive lifestyle modification including diet, exercise and weight loss as the first line treatment for diabetes. Kryslyn agrees to continue metformin as prescribed by her PCP and will follow up at the agreed upon time. We will recheck HgbA1c and fasting insulin at her next visit.  Vitamin D Deficiency Madysun was informed that low vitamin D levels contributes to fatigue and are associated with obesity, breast, and colon cancer. She  agrees to continue to take prescription Vit D @50 ,000 IU every week and will follow up for routine testing of vitamin D, at least 2-3 times per year. She was informed of the risk of over-replacement of vitamin D and agrees to not increase her dose unless she discusses this with Brandi Lester first. We will recheck level at her next visit.  Depression with Emotional Eating Behaviors We discussed behavior modification techniques today to help Dalonda deal with her emotional eating and depression. We discussed Wellbutrin and she declines this at this time but plans on researching the option. I also suggested that seeing our psychologist, Dr. Mallie Mussel, may be helpful. Fanny Skates may consider this in the future.   Obesity Yonna is currently in the action stage of change. As such, her goal is to continue with weight loss efforts She has agreed to keep a food journal with 1400-1500 calories and 95 grams of protein  Tamey has been instructed to continue walking for 30 minutes 3 times per week. We discussed the following Behavioral Modification Strategies today: decrease eating out, work on meal planning and easy cooking plans, avoiding temptations, increasing water, better snacking choices, celebration eating strategies, planning for success, and keeping a strict food journal.  Alishba has agreed to follow up with our clinic in 2-3 weeks. She was informed of the importance of frequent follow up visits to maximize her success with intensive lifestyle modifications for her multiple health conditions.  OBESITY BEHAVIORAL INTERVENTION VISIT  Today's visit was # 24 Starting weight: 325 lbs Starting date: 05/23/17 Today's weight : Weight: (!) 312 lb (141.5 kg)  Today's date: 06/26/2018 Total lbs lost to date: 13  ASK: We discussed the diagnosis of obesity with [name] today and Fusako agreed to give Brandi Lester permission to discuss obesity behavioral modification therapy today.  ASSESS: Yamilette has the diagnosis of  obesity and her BMI today is 44.8. Demisha is in the action stage of change   ADVISE: Lakeidra was educated on the multiple health risks of obesity as well as the benefit of weight loss to improve her health. She was advised of the need for long term treatment and the importance of lifestyle modifications to improve her current health and to decrease her risk of future health problems.  AGREE: Multiple dietary modification options and treatment options were discussed and  Annamaria agreed to follow the recommendations documented in the above note.  ARRANGE: Jeane was educated on the importance of frequent visits to treat obesity as outlined per CMS and USPSTF guidelines and agreed to schedule her next follow up appointment today.  I spent > than 50% of the 15 minute visit on counseling as documented in the note.

## 2018-06-30 ENCOUNTER — Ambulatory Visit: Payer: BC Managed Care – PPO | Admitting: Family Medicine

## 2018-07-01 ENCOUNTER — Other Ambulatory Visit: Payer: Self-pay | Admitting: Family Medicine

## 2018-07-01 ENCOUNTER — Ambulatory Visit: Payer: BC Managed Care – PPO | Admitting: Family Medicine

## 2018-07-01 VITALS — BP 138/82 | HR 95 | Temp 98.7°F | Resp 18 | Wt 317.4 lb

## 2018-07-01 DIAGNOSIS — E559 Vitamin D deficiency, unspecified: Secondary | ICD-10-CM | POA: Diagnosis not present

## 2018-07-01 DIAGNOSIS — E6609 Other obesity due to excess calories: Secondary | ICD-10-CM

## 2018-07-01 DIAGNOSIS — E119 Type 2 diabetes mellitus without complications: Secondary | ICD-10-CM

## 2018-07-01 DIAGNOSIS — D649 Anemia, unspecified: Secondary | ICD-10-CM | POA: Diagnosis not present

## 2018-07-01 DIAGNOSIS — E1169 Type 2 diabetes mellitus with other specified complication: Secondary | ICD-10-CM

## 2018-07-01 DIAGNOSIS — I10 Essential (primary) hypertension: Secondary | ICD-10-CM

## 2018-07-01 DIAGNOSIS — Z23 Encounter for immunization: Secondary | ICD-10-CM

## 2018-07-01 DIAGNOSIS — E669 Obesity, unspecified: Secondary | ICD-10-CM

## 2018-07-01 MED ORDER — FLUCONAZOLE 150 MG PO TABS: 150.0000 mg | ORAL_TABLET | ORAL | 0 refills | Status: DC

## 2018-07-01 MED ORDER — LISINOPRIL-HYDROCHLOROTHIAZIDE 20-12.5 MG PO TABS: 1.0000 | ORAL_TABLET | Freq: Two times a day (BID) | ORAL | 1 refills | Status: DC

## 2018-07-01 MED FILL — POTASSIUM CL ER 20 MEQ TAB: 20 | 30 days supply | Qty: 120 | Fill #2

## 2018-07-01 NOTE — Assessment & Plan Note (Signed)
hgba1c acceptable, minimize simple carbs. Increase exercise as tolerated.  

## 2018-07-01 NOTE — Assessment & Plan Note (Signed)
Encouraged heart healthy diet, increase exercise, avoid trans fats, consider a krill oil cap daily 

## 2018-07-01 NOTE — Patient Instructions (Signed)
Vitamin D 1000 IU daily Preventive Care 40-64 Years, Female Preventive care refers to lifestyle choices and visits with your health care provider that can promote health and wellness. What does preventive care include?  A yearly physical exam. This is also called an annual well check.  Dental exams once or twice a year.  Routine eye exams. Ask your health care provider how often you should have your eyes checked.  Personal lifestyle choices, including: ? Daily care of your teeth and gums. ? Regular physical activity. ? Eating a healthy diet. ? Avoiding tobacco and drug use. ? Limiting alcohol use. ? Practicing safe sex. ? Taking low-dose aspirin daily starting at age 49. ? Taking vitamin and mineral supplements as recommended by your health care provider. What happens during an annual well check? The services and screenings done by your health care provider during your annual well check will depend on your age, overall health, lifestyle risk factors, and family history of disease. Counseling Your health care provider may ask you questions about your:  Alcohol use.  Tobacco use.  Drug use.  Emotional well-being.  Home and relationship well-being.  Sexual activity.  Eating habits.  Work and work Statistician.  Method of birth control.  Menstrual cycle.  Pregnancy history.  Screening You may have the following tests or measurements:  Height, weight, and BMI.  Blood pressure.  Lipid and cholesterol levels. These may be checked every 5 years, or more frequently if you are over 51 years old.  Skin check.  Lung cancer screening. You may have this screening every year starting at age 18 if you have a 30-pack-year history of smoking and currently smoke or have quit within the past 15 years.  Fecal occult blood test (FOBT) of the stool. You may have this test every year starting at age 32.  Flexible sigmoidoscopy or colonoscopy. You may have a sigmoidoscopy every 5  years or a colonoscopy every 10 years starting at age 58.  Hepatitis C blood test.  Hepatitis B blood test.  Sexually transmitted disease (STD) testing.  Diabetes screening. This is done by checking your blood sugar (glucose) after you have not eaten for a while (fasting). You may have this done every 1-3 years.  Mammogram. This may be done every 1-2 years. Talk to your health care provider about when you should start having regular mammograms. This may depend on whether you have a family history of breast cancer.  BRCA-related cancer screening. This may be done if you have a family history of breast, ovarian, tubal, or peritoneal cancers.  Pelvic exam and Pap test. This may be done every 3 years starting at age 65. Starting at age 32, this may be done every 5 years if you have a Pap test in combination with an HPV test.  Bone density scan. This is done to screen for osteoporosis. You may have this scan if you are at high risk for osteoporosis.  Discuss your test results, treatment options, and if necessary, the need for more tests with your health care provider. Vaccines Your health care provider may recommend certain vaccines, such as:  Influenza vaccine. This is recommended every year.  Tetanus, diphtheria, and acellular pertussis (Tdap, Td) vaccine. You may need a Td booster every 10 years.  Varicella vaccine. You may need this if you have not been vaccinated.  Zoster vaccine. You may need this after age 67.  Measles, mumps, and rubella (MMR) vaccine. You may need at least one dose of MMR  if you were born in 1957 or later. You may also need a second dose.  Pneumococcal 13-valent conjugate (PCV13) vaccine. You may need this if you have certain conditions and were not previously vaccinated.  Pneumococcal polysaccharide (PPSV23) vaccine. You may need one or two doses if you smoke cigarettes or if you have certain conditions.  Meningococcal vaccine. You may need this if you have  certain conditions.  Hepatitis A vaccine. You may need this if you have certain conditions or if you travel or work in places where you may be exposed to hepatitis A.  Hepatitis B vaccine. You may need this if you have certain conditions or if you travel or work in places where you may be exposed to hepatitis B.  Haemophilus influenzae type b (Hib) vaccine. You may need this if you have certain conditions.  Talk to your health care provider about which screenings and vaccines you need and how often you need them. This information is not intended to replace advice given to you by your health care provider. Make sure you discuss any questions you have with your health care provider. Document Released: 09/16/2015 Document Revised: 05/09/2016 Document Reviewed: 06/21/2015 Elsevier Interactive Patient Education  [employer].

## 2018-07-01 NOTE — Assessment & Plan Note (Signed)
Encouraged DASH diet, decrease po intake and increase exercise as tolerated. Needs 7-8 hours of sleep nightly. Avoid trans fats, eat small, frequent meals every 4-5 hours with lean proteins, complex carbs and healthy fats. Minimize simple carbs, GMO foods. 

## 2018-07-01 NOTE — Assessment & Plan Note (Signed)
Increase leafy greens, consider increased lean red meat and using cast iron cookware. Continue to monitor, report any concerns 

## 2018-07-01 NOTE — Assessment & Plan Note (Addendum)
Well controlled, no changes to meds. Encouraged heart healthy diet such as the DASH diet and exercise as tolerated. Numbers look better when not in clinic. She endorses Systolic bp in 875 to 797K range

## 2018-07-01 NOTE — Assessment & Plan Note (Signed)
Supplement and monitor 

## 2018-07-02 MED FILL — PANTOPRAZOLE SOD DR 40 MG T: 40 | 90 days supply | Qty: 90 | Fill #0

## 2018-07-02 NOTE — Progress Notes (Signed)
Subjective:    Patient ID: Brandi Lester, female    DOB: May 13, 1966, 52 y.o.   MRN: [id number]  No chief complaint on file.   HPI Patient is in today for follow-up on hypertension.  She notes that her blood pressure has been better controlled when she is not here.  Reports frequently seeing systolic blood pressures in the 110s and 120s.  She overall has been under a great deal of stress but feels her blood pressure is holding.  No recent febrile illness or hospitalizations.  No polyuria or polydipsia. Denies CP/palp/SOB/HA/congestion/fevers/GI or GU c/o. Taking meds as prescribed  Past Medical History:  Diagnosis Date  . ADD (attention deficit disorder)   . Allergy    allergic rhinitis  . Anemia 07/25/2013  . Angio-edema   . Back pain   . Costochondritis 02/20/2015  . Diabetes mellitus    Type II   previously 3 years ago - no longer on meds   . Dyslipidemia 07/25/2013  . Fungus infection 10/13   Left great toe  . GERD (gastroesophageal reflux disease)   . Hypertension   . IBS (irritable bowel syndrome)   . Ingrown nail 10/13   right foot next to the last toe  . Insomnia 04/20/2013  . Joint pain   . Lactose intolerance   . Leg edema   . Obesity   . Pedal edema 02/20/2015  . Prediabetes   . Preventative health care 09/25/2015  . PVC (premature ventricular contraction)     Past Surgical History:  Procedure Laterality Date  . HYSTEROSCOPY N/A 10/04/2015   Procedure: HYSTEROSCOPY with Removal IUD;  Surgeon: Vanessa Kick, MD;  Location: [facility];  Service: Gynecology;  Laterality: N/A;  . lasik     eye surgery  . WISDOM TOOTH EXTRACTION      Family History  Problem Relation Age of Onset  . Heart attack Mother 28  . Hypertension Mother   . Heart disease Mother   . Obesity Mother   . Cancer Father        prostate  . Heart attack Sister 67  . Hypertension Other     Social History   Socioeconomic History  . Marital status: Single    Spouse name: Not on file  .  Number of children: Not on file  . Years of education: Not on file  . Highest education level: Not on file  Occupational History  . Occupation: Pharmacist, hospital, [employer]  Social Needs  . Financial resource strain: Not on file  . Food insecurity:    Worry: Not on file    Inability: Not on file  . Transportation needs:    Medical: Not on file    Non-medical: Not on file  Tobacco Use  . Smoking status: Former Smoker    Packs/day: 1.00    Years: 15.00    Pack years: 15.00    Last attempt to quit: 09/03/2001    Years since quitting: 16.8  . Smokeless tobacco: Never Used  Substance and Sexual Activity  . Alcohol use: Yes    Comment: rare  . Drug use: No  . Sexual activity: Not on file  Lifestyle  . Physical activity:    Days per week: Not on file    Minutes per session: Not on file  . Stress: Not on file  Relationships  . Social connections:    Talks on phone: Not on file    Gets together: Not on file    Attends religious  service: Not on file    Active member of club or organization: Not on file    Attends meetings of clubs or organizations: Not on file    Relationship status: Not on file  . Intimate partner violence:    Fear of current or ex partner: Not on file    Emotionally abused: Not on file    Physically abused: Not on file    Forced sexual activity: Not on file  Other Topics Concern  . Not on file  Social History Narrative  . Not on file    Outpatient Medications Prior to Visit  Medication Sig Dispense Refill  . acetaminophen (TYLENOL 8 HOUR) 650 MG CR tablet Take 1 tablet (650 mg total) every 8 (eight) hours as needed by mouth for pain.    [name] amLODipine (NORVASC) 5 MG tablet TAKE 1 TABLET (5 MG TOTAL) BY MOUTH DAILY. 90 tablet 1  . aspirin EC 81 MG tablet Take 1 tablet (81 mg total) by mouth daily.    [name] azelastine (ASTELIN) 0.1 % nasal spray Place 2 sprays into both nostrils 2 (two) times daily. Use in each nostril as directed 90 mL 1  . Cyanocobalamin (B-12) 1000  MCG CAPS Take by mouth daily.    [name] EPINEPHrine (EPIPEN 2-PAK) 0.3 mg/0.3 mL IJ SOAJ injection Use as directed for severe allergic reaction 2 Device 1  . fexofenadine (ALLEGRA ALLERGY) 180 MG tablet Take 1 tablet (180 mg total) by mouth daily. 301 tablet 1  . fluticasone (FLONASE) 50 MCG/ACT nasal spray Place 2 sprays into both nostrils daily. (Patient taking differently: Place 2 sprays into both nostrils daily as needed for allergies. ) 16 g 11  . glucose blood test strip OneTouch Ultra Test strips    . hydrochlorothiazide (HYDRODIURIL) 25 MG tablet Take 1 tablet (25 mg total) by mouth daily. 30 tablet 1  . ibuprofen (ADVIL,MOTRIN) 600 MG tablet Take 1 tablet (600 mg total) by mouth every 6 (six) hours as needed. 90 tablet 0  . losartan (COZAAR) 100 MG tablet Take 1 tablet (100 mg total) by mouth daily. 30 tablet 1  . losartan-hydrochlorothiazide (HYZAAR) 100-25 MG tablet TAKE 1 TABLET BY MOUTH DAILY. 90 tablet 1  . metFORMIN (GLUCOPHAGE) 500 MG tablet TAKE 1 TABLET (500 MG TOTAL) BY MOUTH DAILY WITH BREAKFAST. 90 tablet 1  . montelukast (SINGULAIR) 10 MG tablet Take 1 tablet (10 mg total) by mouth at bedtime as needed. 90 tablet 1  . Omega-3 Fatty Acids (FISH OIL PO) Take 1 capsule by mouth daily.    [name] DELICA LANCETS 10X MISC OneTouch Delica Lancets 33 gauge    . potassium chloride SA (K-DUR,KLOR-CON) 20 MEQ tablet Take 1 tablet (20 mEq total) by mouth 3 (three) times daily. 120 tablet 3  . Probiotic Product (PROBIOTIC-10 PO) Take by mouth daily.    . TURMERIC PO Take 1 capsule by mouth daily.    . Vitamin D, Ergocalciferol, (DRISDOL) 50000 units CAPS capsule Take 1 capsule (50,000 Units total) by mouth every 7 (seven) days. 4 capsule 4  . pantoprazole (PROTONIX) 40 MG tablet Take 1 tablet (40 mg total) by mouth daily. (Patient taking differently: Take 40 mg by mouth every other day. ) 90 tablet 1   No facility-administered medications prior to visit.     Allergies  Allergen  Reactions  . Penicillins Itching and Swelling    Has patient had a PCN reaction causing immediate rash, facial/tongue/throat swelling, SOB or lightheadedness with hypotension: yes Has  patient had a PCN reaction causing severe rash involving mucus membranes or skin necrosis: no Has patient had a PCN reaction that required hospitalization no Has patient had a PCN reaction occurring within the last 10 years: yes If all of the above answers are "NO", then may proceed with Cephalosporin use.   [name] [Liraglutide]     Vomiting    Review of Systems  Constitutional: Positive for malaise/fatigue. Negative for chills and fever.  HENT: Negative for congestion and hearing loss.   Eyes: Negative for discharge.  Respiratory: Negative for cough, sputum production and shortness of breath.   Cardiovascular: Negative for chest pain, palpitations and leg swelling.  Gastrointestinal: Negative for abdominal pain, blood in stool, constipation, diarrhea, heartburn, nausea and vomiting.  Genitourinary: Negative for dysuria, frequency, hematuria and urgency.  Musculoskeletal: Negative for back pain, falls and myalgias.  Skin: Negative for rash.  Neurological: Negative for dizziness, sensory change, loss of consciousness, weakness and headaches.  Endo/Heme/Allergies: Negative for environmental allergies. Does not bruise/bleed easily.  Psychiatric/Behavioral: Negative for depression and suicidal ideas. The patient is nervous/anxious. The patient does not have insomnia.        Objective:    Physical Exam  Constitutional: She is oriented to person, place, and time. She appears well-developed and well-nourished. No distress.  HENT:  Head: Normocephalic and atraumatic.  Nose: Nose normal.  Eyes: Right eye exhibits no discharge. Left eye exhibits no discharge.  Neck: Normal range of motion. Neck supple.  Cardiovascular: Normal rate and regular rhythm.  No murmur heard. Pulmonary/Chest: Effort normal and  breath sounds normal.  Abdominal: Soft. Bowel sounds are normal. There is no tenderness.  Musculoskeletal: She exhibits no edema.  Neurological: She is alert and oriented to person, place, and time.  Skin: Skin is warm and dry.  Psychiatric: She has a normal mood and affect.  Nursing note and vitals reviewed.   BP 138/82 (BP Location: Left Arm, Patient Position: Sitting, Cuff Size: Normal)   Pulse 95   Temp 98.7 F (37.1 C) (Oral)   Resp 18   Wt (!) 317 lb 6.4 oz (144 kg)   SpO2 97%   BMI 45.54 kg/m  Wt Readings from Last 3 Encounters:  07/01/18 (!) 317 lb 6.4 oz (144 kg)  06/26/18 (!) 312 lb (141.5 kg)  06/11/18 (!) 311 lb (141.1 kg)     Lab Results  Component Value Date   WBC 5.9 03/13/2018   HGB 11.9 (L) 03/13/2018   HCT 35.9 (L) 03/13/2018   PLT 283.0 03/13/2018   GLUCOSE 86 03/13/2018   CHOL 140 03/13/2018   TRIG 66.0 03/13/2018   HDL 40.00 03/13/2018   LDLCALC 86 03/13/2018   ALT 9 03/13/2018   AST 9 03/13/2018   NA 139 03/13/2018   K 3.7 03/13/2018   CL 103 03/13/2018   CREATININE 0.60 03/13/2018   BUN 9 03/13/2018   CO2 29 03/13/2018   TSH 2.10 03/13/2018   HGBA1C 5.9 03/13/2018   MICROALBUR 0.50 12/15/2012    Lab Results  Component Value Date   TSH 2.10 03/13/2018   Lab Results  Component Value Date   WBC 5.9 03/13/2018   HGB 11.9 (L) 03/13/2018   HCT 35.9 (L) 03/13/2018   MCV 82.4 03/13/2018   PLT 283.0 03/13/2018   Lab Results  Component Value Date   NA 139 03/13/2018   K 3.7 03/13/2018   CO2 29 03/13/2018   GLUCOSE 86 03/13/2018   BUN 9 03/13/2018   CREATININE  0.60 03/13/2018   BILITOT 0.3 03/13/2018   ALKPHOS 67 03/13/2018   AST 9 03/13/2018   ALT 9 03/13/2018   PROT 7.0 03/13/2018   ALBUMIN 4.0 03/13/2018   CALCIUM 8.9 03/13/2018   GFR 136.18 03/13/2018   Lab Results  Component Value Date   CHOL 140 03/13/2018   Lab Results  Component Value Date   HDL 40.00 03/13/2018   Lab Results  Component Value Date   LDLCALC 86  03/13/2018   Lab Results  Component Value Date   TRIG 66.0 03/13/2018   Lab Results  Component Value Date   CHOLHDL 3 03/13/2018   Lab Results  Component Value Date   HGBA1C 5.9 03/13/2018       Assessment & Plan:   Problem List Items Addressed This Visit    Obesity    Encouraged DASH diet, decrease po intake and increase exercise as tolerated. Needs 7-8 hours of sleep nightly. Avoid trans fats, eat small, frequent meals every 4-5 hours with lean proteins, complex carbs and healthy fats. Minimize simple carbs, GMO foods.      Essential hypertension    Well controlled, no changes to meds. Encouraged heart healthy diet such as the DASH diet and exercise as tolerated. Numbers look better when not in clinic. She endorses Systolic bp in 440 to 102V range      Relevant Medications   lisinopril-hydrochlorothiazide (ZESTORETIC) 20-12.5 MG tablet   Diabetes mellitus type 2 in obese ([place]) - Primary    hgba1c acceptable, minimize simple carbs. Increase exercise as tolerated.       Relevant Medications   lisinopril-hydrochlorothiazide (ZESTORETIC) 20-12.5 MG tablet   Anemia    Increase leafy greens, consider increased lean red meat and using cast iron cookware. Continue to monitor, report any concerns      Vitamin D deficiency    Supplement and monitor         I have discontinued Raelin T. Bale's fluconazole. I am also having her start on lisinopril-hydrochlorothiazide. Additionally, I am having her maintain her fluticasone, fexofenadine, TURMERIC PO, Omega-3 Fatty Acids (FISH OIL PO), ibuprofen, azelastine, aspirin EC, ONETOUCH DELICA LANCETS 25D, glucose blood, EPINEPHrine, B-12, Probiotic Product (PROBIOTIC-10 PO), acetaminophen, potassium chloride SA, losartan-hydrochlorothiazide, metFORMIN, montelukast, Vitamin D (Ergocalciferol), amLODipine, losartan, and hydrochlorothiazide.  Meds ordered this encounter  Medications  . DISCONTD: fluconazole (DIFLUCAN) 150 MG tablet     Sig: Take 1 tablet (150 mg total) by mouth once a week.    Dispense:  2 tablet    Refill:  0  . lisinopril-hydrochlorothiazide (ZESTORETIC) 20-12.5 MG tablet    Sig: Take 1 tablet by mouth 2 (two) times daily.    Dispense:  180 tablet    Refill:  1     Penni Homans, MD

## 2018-07-14 ENCOUNTER — Encounter (INDEPENDENT_AMBULATORY_CARE_PROVIDER_SITE_OTHER): Payer: Self-pay | Admitting: Family Medicine

## 2018-07-14 ENCOUNTER — Ambulatory Visit (INDEPENDENT_AMBULATORY_CARE_PROVIDER_SITE_OTHER): Payer: BC Managed Care – PPO | Admitting: Family Medicine

## 2018-07-14 VITALS — BP 142/83 | HR 89 | Temp 99.3°F | Ht 70.0 in | Wt 308.0 lb

## 2018-07-14 DIAGNOSIS — E559 Vitamin D deficiency, unspecified: Secondary | ICD-10-CM | POA: Diagnosis not present

## 2018-07-14 DIAGNOSIS — F3289 Other specified depressive episodes: Secondary | ICD-10-CM | POA: Diagnosis not present

## 2018-07-14 DIAGNOSIS — Z6841 Body Mass Index (BMI) 40.0 and over, adult: Secondary | ICD-10-CM

## 2018-07-14 DIAGNOSIS — E119 Type 2 diabetes mellitus without complications: Secondary | ICD-10-CM

## 2018-07-14 DIAGNOSIS — I10 Essential (primary) hypertension: Secondary | ICD-10-CM

## 2018-07-14 DIAGNOSIS — Z9189 Other specified personal risk factors, not elsewhere classified: Secondary | ICD-10-CM

## 2018-07-15 LAB — COMPREHENSIVE METABOLIC PANEL
ALT: 8 IU/L (ref 0–32)
AST: 8 IU/L (ref 0–40)
Albumin/Globulin Ratio: 1.4 (ref 1.2–2.2)
Albumin: 4.2 g/dL (ref 3.5–5.5)
Alkaline Phosphatase: 83 IU/L (ref 39–117)
BUN/Creatinine Ratio: 12 (ref 9–23)
BUN: 9 mg/dL (ref 6–24)
Bilirubin Total: 0.3 mg/dL (ref 0.0–1.2)
CO2: 25 mmol/L (ref 20–29)
Calcium: 9.2 mg/dL (ref 8.7–10.2)
Chloride: 100 mmol/L (ref 96–106)
Creatinine, Ser: 0.74 mg/dL (ref 0.57–1.00)
GFR calc Af Amer: 109 mL/min/{1.73_m2} (ref >59)
GFR calc non Af Amer: 95 mL/min/{1.73_m2} (ref >59)
Globulin, Total: 3 g/dL (ref 1.5–4.5)
Glucose: 86 mg/dL (ref 65–99)
Potassium: 3.8 mmol/L (ref 3.5–5.2)
Sodium: 139 mmol/L (ref 134–144)
Total Protein: 7.2 g/dL (ref 6.0–8.5)

## 2018-07-15 LAB — HEMOGLOBIN A1C
Est. average glucose Bld gHb Est-mCnc: 123 mg/dL
Hgb A1c MFr Bld: 5.9 % — ABNORMAL HIGH (ref 4.8–5.6)

## 2018-07-15 LAB — VITAMIN D 25 HYDROXY (VIT D DEFICIENCY, FRACTURES): Vit D, 25-Hydroxy: 45.2 ng/mL (ref 30.0–100.0)

## 2018-07-15 LAB — INSULIN, RANDOM: INSULIN: 15.4 u[IU]/mL (ref 2.6–24.9)

## 2018-07-15 NOTE — Progress Notes (Signed)
Office: [phone]  /  Fax: [phone]   HPI:   Chief Complaint: OBESITY Brandi Lester is here to discuss her progress with her obesity treatment plan. She is keeping a food journal with 1400-1500 calories and 95 g protein and is following her eating plan approximately 80 % of the time. She states she is walking 30 minutes 4 times per week. Brandi Lester has been doing more meal planning.  Her weight is (!) 308 lb (139.7 kg) today and has had a weight loss of 4 pounds over a period of 2 weeks since her last visit. She has lost 17 lbs since starting treatment with [name].  Hypertension Brandi Lester is a 52 y.o. female with hypertension.  Brandi Lester denies chest pain, headaches or shortness of breath on exertion. She is working weight loss to help control her blood pressure with the goal of decreasing her risk of heart attack and stroke. Brandi Lester just recently started Lisinopril-HCTZ by her PCP. BP slightly elevated today.  Diabetes II Brandi Lester has a diagnosis of diabetes type II. Brandi Lester states her sugars are well controlled and her A1C is stable on medication. She denies any nausea, vomiting, diarrhea and hypoglycemic episodes. Last A1c was Hemoglobin A1C Latest Ref Rng & Units 07/14/2018 03/13/2018 09/12/2017  HGBA1C 4.8 - 5.6 % 5.9(H) 5.9 5.8(H)  Some recent data might be hidden    She has been working on intensive lifestyle modifications including diet, exercise, and weight loss to help control her blood glucose levels.  Vitamin D deficiency Brandi Lester has a diagnosis of vitamin D deficiency. She is stable taking prescription Vit D but not yet at goal. She denies nausea, vomiting or muscle weakness.  At risk for osteopenia and osteoporosis Brandi Lester is at higher risk of osteopenia and osteoporosis due to vitamin D deficiency.   Depression with emotional eating behaviors Brandi Lester is struggling with emotional eating and using food for comfort to the extent that it is negatively impacting her  health. She often snacks when she is not hungry. Brandi Lester sometimes feels she is out of control and then feels guilty that she made poor food choices. She has been working on behavior modification techniques to help reduce her emotional eating and has been somewhat successful. She does feel that Brandi Lester emotional eating has improved. Brandi Lester does have ADHD as well so Wellbutrin may be beneficial to her. She shows no sign of suicidal or homicidal ideations.  Depression screen [facility] 2/9 05/23/2017 07/03/2016 04/13/2016  Decreased Interest 2 0 0  Down, Depressed, Hopeless 1 0 0  PHQ - 2 Score 3 0 0  Altered sleeping 1 - -  Tired, decreased energy 1 - -  Change in appetite 1 - -  Feeling bad or failure about yourself  0 - -  Trouble concentrating 1 - -  Moving slowly or fidgety/restless 0 - -  Suicidal thoughts 0 - -  PHQ-9 Score 7 - -  Difficult doing work/chores Not difficult at all - -     ALLERGIES: Allergies  Allergen Reactions  . Penicillins Itching and Swelling    Has patient had a PCN reaction causing immediate rash, facial/tongue/throat swelling, SOB or lightheadedness with hypotension: yes Has patient had a PCN reaction causing severe rash involving mucus membranes or skin necrosis: no Has patient had a PCN reaction that required hospitalization no Has patient had a PCN reaction occurring within the last 10 years: yes If all of the above answers are "NO", then may proceed with Cephalosporin use.   [name]  Victoza [Liraglutide]     Vomiting    MEDICATIONS: Current Outpatient Medications on File Prior to Visit  Medication Sig Dispense Refill  . acetaminophen (TYLENOL 8 HOUR) 650 MG CR tablet Take 1 tablet (650 mg total) every 8 (eight) hours as needed by mouth for pain.    [name] amLODipine (NORVASC) 5 MG tablet TAKE 1 TABLET (5 MG TOTAL) BY MOUTH DAILY. 90 tablet 1  . aspirin EC 81 MG tablet Take 1 tablet (81 mg total) by mouth daily.    [name] azelastine (ASTELIN) 0.1 % nasal spray Place 2 sprays  into both nostrils 2 (two) times daily. Use in each nostril as directed 90 mL 1  . Cyanocobalamin (B-12) 1000 MCG CAPS Take by mouth daily.    [name] EPINEPHrine (EPIPEN 2-PAK) 0.3 mg/0.3 mL IJ SOAJ injection Use as directed for severe allergic reaction 2 Device 1  . fexofenadine (ALLEGRA ALLERGY) 180 MG tablet Take 1 tablet (180 mg total) by mouth daily. 301 tablet 1  . fluticasone (FLONASE) 50 MCG/ACT nasal spray Place 2 sprays into both nostrils daily. (Patient taking differently: Place 2 sprays into both nostrils daily as needed for allergies. ) 16 g 11  . glucose blood test strip OneTouch Ultra Test strips    . ibuprofen (ADVIL,MOTRIN) 600 MG tablet Take 1 tablet (600 mg total) by mouth every 6 (six) hours as needed. 90 tablet 0  . lisinopril-hydrochlorothiazide (ZESTORETIC) 20-12.5 MG tablet Take 1 tablet by mouth 2 (two) times daily. 180 tablet 1  . losartan-hydrochlorothiazide (HYZAAR) 100-25 MG tablet TAKE 1 TABLET BY MOUTH DAILY. 90 tablet 1  . metFORMIN (GLUCOPHAGE) 500 MG tablet TAKE 1 TABLET (500 MG TOTAL) BY MOUTH DAILY WITH BREAKFAST. 90 tablet 1  . montelukast (SINGULAIR) 10 MG tablet Take 1 tablet (10 mg total) by mouth at bedtime as needed. 90 tablet 1  . Omega-3 Fatty Acids (FISH OIL PO) Take 1 capsule by mouth daily.    [name] DELICA LANCETS 19Q MISC OneTouch Delica Lancets 33 gauge    . pantoprazole (PROTONIX) 40 MG tablet TAKE 1 TABLET (40 MG TOTAL) BY MOUTH DAILY. 90 tablet 1  . potassium chloride SA (K-DUR,KLOR-CON) 20 MEQ tablet Take 1 tablet (20 mEq total) by mouth 3 (three) times daily. 120 tablet 3  . Probiotic Product (PROBIOTIC-10 PO) Take by mouth daily.    . TURMERIC PO Take 1 capsule by mouth daily.    . Vitamin D, Ergocalciferol, (DRISDOL) 50000 units CAPS capsule Take 1 capsule (50,000 Units total) by mouth every 7 (seven) days. 4 capsule 4   No current facility-administered medications on file prior to visit.     PAST MEDICAL HISTORY: Past Medical History:    Diagnosis Date  . ADD (attention deficit disorder)   . Allergy    allergic rhinitis  . Anemia 07/25/2013  . Angio-edema   . Back pain   . Costochondritis 02/20/2015  . Diabetes mellitus    Type II   previously 3 years ago - no longer on meds   . Dyslipidemia 07/25/2013  . Fungus infection 10/13   Left great toe  . GERD (gastroesophageal reflux disease)   . Hypertension   . IBS (irritable bowel syndrome)   . Ingrown nail 10/13   right foot next to the last toe  . Insomnia 04/20/2013  . Joint pain   . Lactose intolerance   . Leg edema   . Obesity   . Pedal edema 02/20/2015  . Prediabetes   . Preventative  health care 09/25/2015  . PVC (premature ventricular contraction)     PAST SURGICAL HISTORY: Past Surgical History:  Procedure Laterality Date  . HYSTEROSCOPY N/A 10/04/2015   Procedure: HYSTEROSCOPY with Removal IUD;  Surgeon: Vanessa Kick, MD;  Location: [facility];  Service: Gynecology;  Laterality: N/A;  . lasik     eye surgery  . WISDOM TOOTH EXTRACTION      SOCIAL HISTORY: Social History   Tobacco Use  . Smoking status: Former Smoker    Packs/day: 1.00    Years: 15.00    Pack years: 15.00    Last attempt to quit: 09/03/2001    Years since quitting: 16.8  . Smokeless tobacco: Never Used  Substance Use Topics  . Alcohol use: Yes    Comment: rare  . Drug use: No    FAMILY HISTORY: Family History  Problem Relation Age of Onset  . Heart attack Mother 103  . Hypertension Mother   . Heart disease Mother   . Obesity Mother   . Cancer Father        prostate  . Heart attack Sister 51  . Hypertension Other     ROS: Review of Systems  Constitutional: Positive for malaise/fatigue and weight loss.  Respiratory: Negative for shortness of breath.   Cardiovascular: Negative for chest pain.  Gastrointestinal: Negative for diarrhea, nausea and vomiting.  Musculoskeletal:       Negative for muscle weakness  Neurological: Negative for headaches.  Endo/Heme/Allergies:        Negative for hypoglycemia  Psychiatric/Behavioral: Positive for depression. Negative for suicidal ideas.       Negative for homicidal ideations    PHYSICAL EXAM: Blood pressure (!) 142/83, pulse 89, temperature 99.3 F (37.4 C), temperature source Oral, height 5\' 10"  (1.778 m), weight (!) 308 lb (139.7 kg), SpO2 98 %. Body mass index is 44.19 kg/m. Physical Exam  Constitutional: She is oriented to person, place, and time. She appears well-developed and well-nourished.  Cardiovascular: Normal rate.  Pulmonary/Chest: Effort normal.  Musculoskeletal: Normal range of motion.  Neurological: She is alert and oriented to person, place, and time.  Skin: Skin is warm and dry.  Psychiatric: She has a normal mood and affect. Her behavior is normal.  Vitals reviewed.   RECENT LABS AND TESTS: BMET    Component Value Date/Time   NA 139 07/14/2018 1109   K 3.8 07/14/2018 1109   CL 100 07/14/2018 1109   CO2 25 07/14/2018 1109   GLUCOSE 86 07/14/2018 1109   GLUCOSE 86 03/13/2018 1152   BUN 9 07/14/2018 1109   CREATININE 0.74 07/14/2018 1109   CREATININE 0.72 02/03/2014 1648   CALCIUM 9.2 07/14/2018 1109   GFRNONAA 95 07/14/2018 1109   GFRAA 109 07/14/2018 1109   Lab Results  Component Value Date   HGBA1C 5.9 (H) 07/14/2018   HGBA1C 5.9 03/13/2018   HGBA1C 5.8 (H) 09/12/2017   HGBA1C 6.2 (H) 05/23/2017   HGBA1C 6.6 (H) 03/15/2017   Lab Results  Component Value Date   INSULIN 15.4 07/14/2018   INSULIN 12.9 09/12/2017   INSULIN 31.1 (H) 05/23/2017   CBC    Component Value Date/Time   WBC 5.9 03/13/2018 1152   RBC 4.35 03/13/2018 1152   HGB 11.9 (L) 03/13/2018 1152   HGB 12.1 05/23/2017 1025   HCT 35.9 (L) 03/13/2018 1152   HCT 36.6 05/23/2017 1025   PLT 283.0 03/13/2018 1152   MCV 82.4 03/13/2018 1152   MCV 82 05/23/2017 1025  MCH 27.1 05/23/2017 1025   MCH 28.2 02/03/2014 1648   MCHC 33.2 03/13/2018 1152   RDW 15.0 03/13/2018 1152   RDW 14.9 05/23/2017 1025    LYMPHSABS 1.7 05/23/2017 1025   MONOABS 0.4 06/09/2014 0843   EOSABS 0.1 05/23/2017 1025   BASOSABS 0.0 05/23/2017 1025   Iron/TIBC/Ferritin/ %Sat No results found for: IRON, TIBC, FERRITIN, IRONPCTSAT Lipid Panel     Component Value Date/Time   CHOL 140 03/13/2018 1152   CHOL 132 09/12/2017 0927   TRIG 66.0 03/13/2018 1152   HDL 40.00 03/13/2018 1152   HDL 32 (L) 09/12/2017 0927   CHOLHDL 3 03/13/2018 1152   VLDL 13.2 03/13/2018 1152   LDLCALC 86 03/13/2018 1152   LDLCALC 81 09/12/2017 0927   Hepatic Function Panel     Component Value Date/Time   PROT 7.2 07/14/2018 1109   ALBUMIN 4.2 07/14/2018 1109   AST 8 07/14/2018 1109   ALT 8 07/14/2018 1109   ALKPHOS 83 07/14/2018 1109   BILITOT 0.3 07/14/2018 1109   BILIDIR 0.0 06/09/2014 0843   IBILI 0.1 (L) 02/03/2014 1648      Component Value Date/Time   TSH 2.10 03/13/2018 1152   TSH 1.710 05/23/2017 1025   TSH 1.26 03/15/2017 1140  Results for NIMCO, BIVENS (MRN [id number]) as of 07/15/2018 17:35  Ref. Range 07/14/2018 11:09  Vitamin D, 25-Hydroxy Latest Ref Range: 30.0 - 100.0 ng/mL 45.2    ASSESSMENT AND PLAN: Essential hypertension  Type 2 diabetes mellitus without complication, without long-term current use of insulin (HCC) - Plan: Comprehensive metabolic panel, Hemoglobin A1c, Insulin, random  Vitamin D deficiency - Plan: VITAMIN D 25 Hydroxy (Vit-D Deficiency, Fractures)  Other depression - with emotional eating  At risk for osteoporosis  Class 3 severe obesity with serious comorbidity and body mass index (BMI) of 40.0 to 44.9 in adult, unspecified obesity type ([place])  PLAN: Hypertension We discussed sodium restriction, working on healthy weight loss, and a regular exercise program as the means to achieve improved blood pressure control. Linnae agreed with this plan and agreed to follow up as directed. We will continue to monitor her blood pressure as well as her progress with the above lifestyle  modifications. She will continue her medications as prescribed and will watch for signs of hypotension as she continues her lifestyle modifications. We will be checking labs today. Kenndra has agreed to follow up with our office in 2 weeks.   Diabetes II Giannamarie has been given extensive diabetes education by myself today including ideal fasting and post-prandial blood glucose readings, individual ideal HgA1c goals  and hypoglycemia prevention. We discussed the importance of good blood sugar control to decrease the likelihood of diabetic complications such as nephropathy, neuropathy, limb loss, blindness, coronary artery disease, and death. We discussed the importance of intensive lifestyle modification including diet, exercise and weight loss as the first line treatment for diabetes. Brandi Lester agrees to continue her diabetes medications and we will check an A1C and insulin on her today. Brandi Lester agrees to follow up with our office in 2 weeks.   Vitamin D Deficiency Brandi Lester was informed that low vitamin D levels contributes to fatigue and are associated with obesity, breast, and colon cancer. She agrees to continue to take prescription Vit D @50 ,000 IU every week and will follow up for routine testing of vitamin D, at least 2-3 times per year. She was informed of the risk of over-replacement of vitamin D and agrees to not increase her dose unless  she discusses this with [name] first. We will check her Vit D level today. Brandi Lester agrees to follow up with our office in 2 weeks.   At risk for osteopenia and osteoporosis Brandi Lester was given extended  (15 minutes) osteoporosis prevention counseling today. Brandi Lester is at risk for osteopenia and osteoporsis due to her vitamin D deficiency. She was encouraged to take her vitamin D and follow her higher calcium diet and increase strengthening exercise to help strengthen her bones and decrease her risk of osteopenia and osteoporosis. Brandi Lester agrees to follow up with our office  in 2 weeks.   Depression with Emotional Eating Behaviors We discussed behavior modification techniques today to help Brandi Lester deal with her emotional eating and depression. She has decided not to start Wellbutrin today. Brandi Lester agrees to follow up with our office in 2 weeks.   Obesity Jordayn is currently in the action stage of change. As such, her goal is to continue with weight loss efforts She has agreed to keep a food journal with 1400-1500 calories and 95 g protein  Isamar has been instructed to work up to a goal of 150 minutes of combined cardio and strengthening exercise per week for weight loss and overall health benefits. We discussed the following Behavioral Modification Strategies today: work on meal planning and easy cooking plans, celebration eating strategies, planning for success, keeping a strict food journal and holiday eating strategies   Yajahira has agreed to follow up with our clinic in 2 weeks. She was informed of the importance of frequent follow up visits to maximize her success with intensive lifestyle modifications for her multiple health conditions.   OBESITY BEHAVIORAL INTERVENTION VISIT  Today's visit was # 14   Starting weight: 325 lbs Starting date: 05/23/2017 Today's weight : Weight: (!) 308 lb (139.7 kg)  Today's date: 07/14/2018 Total lbs lost to date: 17 lbs At least 15 minutes were spent on discussing the following behavioral intervention visit.   ASK: We discussed the diagnosis of obesity with Brandi Lester today and Zeeva agreed to give [name] permission to discuss obesity behavioral modification therapy today.  ASSESS: Shanika has the diagnosis of obesity and her BMI today is 44.19 Presly is in the action stage of change   ADVISE: Charleigh was educated on the multiple health risks of obesity as well as the benefit of weight loss to improve her health. She was advised of the need for long term treatment and the importance of lifestyle  modifications to improve her current health and to decrease her risk of future health problems.  AGREE: Multiple dietary modification options and treatment options were discussed and  Jaidah agreed to follow the recommendations documented in the above note.  ARRANGE: Anaisha was educated on the importance of frequent visits to treat obesity as outlined per CMS and USPSTF guidelines and agreed to schedule her next follow up appointment today.  I, Remi Deter, CMA, am acting as Location manager for [employer], [place].  I have reviewed the above documentation for accuracy and completeness, and I agree with the above.  - Cotey Rakes, FNP-C.

## 2018-07-16 ENCOUNTER — Encounter (INDEPENDENT_AMBULATORY_CARE_PROVIDER_SITE_OTHER): Payer: Self-pay | Admitting: Family Medicine

## 2018-07-18 ENCOUNTER — Ambulatory Visit
Admission: RE | Admit: 2018-07-18 | Discharge: 2018-07-18 | Disposition: A | Discharge status: Home / Self Care | Payer: BC Managed Care – PPO | Source: Ambulatory Visit | Attending: Obstetrics & Gynecology | Admitting: Obstetrics & Gynecology

## 2018-07-18 DIAGNOSIS — Z1231 Encounter for screening mammogram for malignant neoplasm of breast: Secondary | ICD-10-CM

## 2018-07-21 ENCOUNTER — Encounter: Payer: Self-pay | Admitting: Family Medicine

## 2018-07-21 MED FILL — VIT D2 1.25 MG (50,000 UNIT: 1.25 MG | 28 days supply | Qty: 4 | Fill #4

## 2018-07-23 MED FILL — MEDROXYPROGESTERONE 10 MG T: 10 | 90 days supply | Qty: 90 | Fill #0

## 2018-07-23 MED FILL — AMLODIPINE BESYLATE 5 MG TA: 5 | 90 days supply | Qty: 90 | Fill #1

## 2018-07-29 ENCOUNTER — Ambulatory Visit (INDEPENDENT_AMBULATORY_CARE_PROVIDER_SITE_OTHER): Payer: BC Managed Care – PPO | Admitting: Family Medicine

## 2018-07-29 ENCOUNTER — Encounter (INDEPENDENT_AMBULATORY_CARE_PROVIDER_SITE_OTHER): Payer: Self-pay | Admitting: Family Medicine

## 2018-07-29 VITALS — BP 143/84 | HR 89 | Temp 98.8°F | Ht 70.0 in | Wt 315.0 lb

## 2018-07-29 DIAGNOSIS — I10 Essential (primary) hypertension: Secondary | ICD-10-CM | POA: Diagnosis not present

## 2018-07-29 DIAGNOSIS — E119 Type 2 diabetes mellitus without complications: Secondary | ICD-10-CM | POA: Diagnosis not present

## 2018-07-29 DIAGNOSIS — Z9189 Other specified personal risk factors, not elsewhere classified: Secondary | ICD-10-CM

## 2018-07-29 DIAGNOSIS — Z6841 Body Mass Index (BMI) 40.0 and over, adult: Secondary | ICD-10-CM

## 2018-07-29 MED ORDER — METFORMIN HCL 500 MG PO TABS: 500.0000 mg | ORAL_TABLET | Freq: Two times a day (BID) | ORAL | 0 refills | Status: DC

## 2018-08-04 ENCOUNTER — Encounter: Payer: Self-pay | Admitting: Family Medicine

## 2018-08-05 ENCOUNTER — Other Ambulatory Visit: Payer: Self-pay | Admitting: Family Medicine

## 2018-08-05 ENCOUNTER — Encounter (INDEPENDENT_AMBULATORY_CARE_PROVIDER_SITE_OTHER): Payer: Self-pay | Admitting: Family Medicine

## 2018-08-05 MED ORDER — AMLODIPINE BESYLATE 5 MG PO TABS: 5.0000 mg | ORAL_TABLET | Freq: Two times a day (BID) | ORAL | 1 refills | Status: DC

## 2018-08-05 NOTE — Progress Notes (Signed)
Office: [phone]  /  Fax: [phone]   HPI:   Chief Complaint: OBESITY [name] is here to discuss her progress with her obesity treatment plan. She is keeping a food journal with 1400 to 1500 calories and 75 grams of protein and is following her eating plan approximately 75 % of the time. She states she is exercising 0 minutes 0 times per week. [name] is currently struggling at dinner with staying on the plan. She still eats out some.   Her weight is (!) 315 lb (142.9 kg) today and has had a weight gain of 7 pounds over a period of 2 weeks since her last visit. She has lost 10 lbs since starting treatment with [name].  Hypertension [name] STANDARD is a 52 y.o. female with hypertension. BP is elevated at today's visit but well controlled according to home BP monitoring. [name]'s blood pressure runs 130's/70-80's at home and she is compliant with her medicines. She is working on weight loss to help control her blood pressure with the goal of decreasing her risk of heart attack and stroke. Teauna denies chest pain, shortness of breath, or headaches.   Diabetes II [name] has a diagnosis of diabetes type II and it is well controlled. She admits to rare hypoglycemic episodes. Last A1c was 5.9 on 07/14/18. She admits to polyphagia during the day and denies nausea, vomiting, or diarrhea. She has been working on intensive lifestyle modifications including diet, exercise, and weight loss to help control her blood glucose levels.  At risk for osteopenia and osteoporosis [name] is at higher risk of osteopenia and osteoporosis due to vitamin D deficiency.   ALLERGIES: Allergies  Allergen Reactions  . Penicillins Itching and Swelling    Has patient had a PCN reaction causing immediate rash, facial/tongue/throat swelling, SOB or lightheadedness with hypotension: yes Has patient had a PCN reaction causing severe rash involving mucus membranes or skin necrosis: no Has patient had a PCN reaction  that required hospitalization no Has patient had a PCN reaction occurring within the last 10 years: yes If all of the above answers are "NO", then may proceed with Cephalosporin use.   [name] [Liraglutide]     Vomiting    MEDICATIONS: Current Outpatient Medications on File Prior to Visit  Medication Sig Dispense Refill  . acetaminophen (TYLENOL 8 HOUR) 650 MG CR tablet Take 1 tablet (650 mg total) every 8 (eight) hours as needed by mouth for pain.    [name] amLODipine (NORVASC) 5 MG tablet TAKE 1 TABLET (5 MG TOTAL) BY MOUTH DAILY. 90 tablet 1  . aspirin EC 81 MG tablet Take 1 tablet (81 mg total) by mouth daily.    [name] azelastine (ASTELIN) 0.1 % nasal spray Place 2 sprays into both nostrils 2 (two) times daily. Use in each nostril as directed 90 mL 1  . Cyanocobalamin (B-12) 1000 MCG CAPS Take by mouth daily.    [name] EPINEPHrine (EPIPEN 2-PAK) 0.3 mg/0.3 mL IJ SOAJ injection Use as directed for severe allergic reaction 2 Device 1  . fexofenadine (ALLEGRA ALLERGY) 180 MG tablet Take 1 tablet (180 mg total) by mouth daily. 301 tablet 1  . fluticasone (FLONASE) 50 MCG/ACT nasal spray Place 2 sprays into both nostrils daily. (Patient taking differently: Place 2 sprays into both nostrils daily as needed for allergies. ) 16 g 11  . glucose blood test strip OneTouch Ultra Test strips    . ibuprofen (ADVIL,MOTRIN) 600 MG tablet Take 1 tablet (600 mg total) by mouth  every 6 (six) hours as needed. 90 tablet 0  . lisinopril-hydrochlorothiazide (ZESTORETIC) 20-12.5 MG tablet Take 1 tablet by mouth 2 (two) times daily. 180 tablet 1  . losartan-hydrochlorothiazide (HYZAAR) 100-25 MG tablet TAKE 1 TABLET BY MOUTH DAILY. 90 tablet 1  . montelukast (SINGULAIR) 10 MG tablet Take 1 tablet (10 mg total) by mouth at bedtime as needed. 90 tablet 1  . Omega-3 Fatty Acids (FISH OIL PO) Take 1 capsule by mouth daily.    [name] DELICA LANCETS 17B MISC OneTouch Delica Lancets 33 gauge    . pantoprazole (PROTONIX) 40 MG  tablet TAKE 1 TABLET (40 MG TOTAL) BY MOUTH DAILY. 90 tablet 1  . potassium chloride SA (K-DUR,KLOR-CON) 20 MEQ tablet Take 1 tablet (20 mEq total) by mouth 3 (three) times daily. 120 tablet 3  . Probiotic Product (PROBIOTIC-10 PO) Take by mouth daily.    . TURMERIC PO Take 1 capsule by mouth daily.    . Vitamin D, Ergocalciferol, (DRISDOL) 50000 units CAPS capsule Take 1 capsule (50,000 Units total) by mouth every 7 (seven) days. 4 capsule 4   No current facility-administered medications on file prior to visit.     PAST MEDICAL HISTORY: Past Medical History:  Diagnosis Date  . ADD (attention deficit disorder)   . Allergy    allergic rhinitis  . Anemia 07/25/2013  . Angio-edema   . Back pain   . Costochondritis 02/20/2015  . Diabetes mellitus    Type II   previously 3 years ago - no longer on meds   . Dyslipidemia 07/25/2013  . Fungus infection 10/13   Left great toe  . GERD (gastroesophageal reflux disease)   . Hypertension   . IBS (irritable bowel syndrome)   . Ingrown nail 10/13   right foot next to the last toe  . Insomnia 04/20/2013  . Joint pain   . Lactose intolerance   . Leg edema   . Obesity   . Pedal edema 02/20/2015  . Prediabetes   . Preventative health care 09/25/2015  . PVC (premature ventricular contraction)     PAST SURGICAL HISTORY: Past Surgical History:  Procedure Laterality Date  . HYSTEROSCOPY N/A 10/04/2015   Procedure: HYSTEROSCOPY with Removal IUD;  Surgeon: Vanessa Kick, MD;  Location: [facility];  Service: Gynecology;  Laterality: N/A;  . lasik     eye surgery  . WISDOM TOOTH EXTRACTION      SOCIAL HISTORY: Social History   Tobacco Use  . Smoking status: Former Smoker    Packs/day: 1.00    Years: 15.00    Pack years: 15.00    Last attempt to quit: 09/03/2001    Years since quitting: 16.9  . Smokeless tobacco: Never Used  Substance Use Topics  . Alcohol use: Yes    Comment: rare  . Drug use: No    FAMILY HISTORY: Family History    Problem Relation Age of Onset  . Heart attack Mother 13  . Hypertension Mother   . Heart disease Mother   . Obesity Mother   . Cancer Father        prostate  . Heart attack Sister 46  . Hypertension Other     ROS: Review of Systems  Constitutional: Negative for weight loss.  Respiratory: Negative for shortness of breath.   Cardiovascular: Negative for chest pain.  Gastrointestinal: Negative for diarrhea, nausea and vomiting.  Neurological: Negative for headaches.  Endo/Heme/Allergies:       Positive for polyphagia. Positive for hypoglycemia.  PHYSICAL EXAM: Blood pressure (!) 143/84, pulse 89, temperature 98.8 F (37.1 C), temperature source Oral, height 5\' 10"  (1.778 m), weight (!) 315 lb (142.9 kg), SpO2 100 %. Body mass index is 45.2 kg/m. Physical Exam  Constitutional: She is oriented to person, place, and time. She appears well-developed and well-nourished.  Cardiovascular: Normal rate.  Pulmonary/Chest: Effort normal.  Musculoskeletal: Normal range of motion.  Neurological: She is oriented to person, place, and time.  Skin: Skin is warm and dry.  Psychiatric: She has a normal mood and affect. Her behavior is normal.  Vitals reviewed.   RECENT LABS AND TESTS: BMET    Component Value Date/Time   NA 139 07/14/2018 1109   K 3.8 07/14/2018 1109   CL 100 07/14/2018 1109   CO2 25 07/14/2018 1109   GLUCOSE 86 07/14/2018 1109   GLUCOSE 86 03/13/2018 1152   BUN 9 07/14/2018 1109   CREATININE 0.74 07/14/2018 1109   CREATININE 0.72 02/03/2014 1648   CALCIUM 9.2 07/14/2018 1109   GFRNONAA 95 07/14/2018 1109   GFRAA 109 07/14/2018 1109   Lab Results  Component Value Date   HGBA1C 5.9 (H) 07/14/2018   HGBA1C 5.9 03/13/2018   HGBA1C 5.8 (H) 09/12/2017   HGBA1C 6.2 (H) 05/23/2017   HGBA1C 6.6 (H) 03/15/2017   Lab Results  Component Value Date   INSULIN 15.4 07/14/2018   INSULIN 12.9 09/12/2017   INSULIN 31.1 (H) 05/23/2017   CBC    Component Value  Date/Time   WBC 5.9 03/13/2018 1152   RBC 4.35 03/13/2018 1152   HGB 11.9 (L) 03/13/2018 1152   HGB 12.1 05/23/2017 1025   HCT 35.9 (L) 03/13/2018 1152   HCT 36.6 05/23/2017 1025   PLT 283.0 03/13/2018 1152   MCV 82.4 03/13/2018 1152   MCV 82 05/23/2017 1025   MCH 27.1 05/23/2017 1025   MCH 28.2 02/03/2014 1648   MCHC 33.2 03/13/2018 1152   RDW 15.0 03/13/2018 1152   RDW 14.9 05/23/2017 1025   LYMPHSABS 1.7 05/23/2017 1025   MONOABS 0.4 06/09/2014 0843   EOSABS 0.1 05/23/2017 1025   BASOSABS 0.0 05/23/2017 1025   Iron/TIBC/Ferritin/ %Sat No results found for: IRON, TIBC, FERRITIN, IRONPCTSAT Lipid Panel     Component Value Date/Time   CHOL 140 03/13/2018 1152   CHOL 132 09/12/2017 0927   TRIG 66.0 03/13/2018 1152   HDL 40.00 03/13/2018 1152   HDL 32 (L) 09/12/2017 0927   CHOLHDL 3 03/13/2018 1152   VLDL 13.2 03/13/2018 1152   LDLCALC 86 03/13/2018 1152   LDLCALC 81 09/12/2017 0927   Hepatic Function Panel     Component Value Date/Time   PROT 7.2 07/14/2018 1109   ALBUMIN 4.2 07/14/2018 1109   AST 8 07/14/2018 1109   ALT 8 07/14/2018 1109   ALKPHOS 83 07/14/2018 1109   BILITOT 0.3 07/14/2018 1109   BILIDIR 0.0 06/09/2014 0843   IBILI 0.1 (L) 02/03/2014 1648      Component Value Date/Time   TSH 2.10 03/13/2018 1152   TSH 1.710 05/23/2017 1025   TSH 1.26 03/15/2017 1140   Results for CAROLL, WEINHEIMER (MRN [id number]) as of 08/05/2018 11:50  Ref. Range 07/14/2018 11:09  Vitamin D, 25-Hydroxy Latest Ref Range: 30.0 - 100.0 ng/mL 45.2   ASSESSMENT AND PLAN: Essential hypertension  Type 2 diabetes mellitus without complication, without long-term current use of insulin (HCC) - Plan: metFORMIN (GLUCOPHAGE) 500 MG tablet  At risk for osteoporosis  Class 3 severe obesity with serious comorbidity  and body mass index (BMI) of 45.0 to 49.9 in adult, unspecified obesity type ([place])  PLAN:  Hypertension We discussed sodium restriction, working on healthy weight loss,  and a regular exercise program as the means to achieve improved blood pressure control. We will continue to monitor her blood pressure as well as her progress with the above lifestyle modifications. She will continue her medications and diet as prescribed and will watch for signs of hypotension as she continues her lifestyle modifications. [name] agreed with this plan and agreed to follow up as directed in 2 weeks.  Diabetes II Tanina has been given extensive diabetes education by myself today including ideal fasting and post-prandial blood glucose readings, individual ideal Hgb A1c goals, and hypoglycemia prevention. We discussed the importance of good blood sugar control to decrease the likelihood of diabetic complications such as nephropathy, neuropathy, limb loss, blindness, coronary artery disease, and death. We discussed the importance of intensive lifestyle modification including diet, exercise and weight loss as the first line treatment for diabetes. Lovinia agrees to increase her metformin to 500mg  with breakfast and lunch and will follow up at the agreed upon time in 2 weeks.  At risk for osteopenia and osteoporosis Arlett was given extended (15 minutes) osteoporosis prevention counseling today. Tammra is at risk for osteopenia and osteoporosis due to her vitamin D deficiency. She was encouraged to take her vitamin D and follow her higher calcium diet and increase strengthening exercise to help strengthen her bones and decrease her risk of osteopenia and osteoporosis.  Obesity Westlynn is currently in the action stage of change. As such, her goal is to continue with weight loss efforts. She has agreed to keep a food journal with 1400 to 1500 calories and 95 grams of protein daily. We discussed the following Behavioral Modification Strategies today: holiday eating strategies and planning for success.  Ronnesha has agreed to follow up with our clinic in 2 weeks. She was informed of the  importance of frequent follow up visits to maximize her success with intensive lifestyle modifications for her multiple health conditions.   OBESITY BEHAVIORAL INTERVENTION VISIT  Today's visit was # 15   Starting weight: 325 lbs Starting date: 05/23/17 Today's weight : Weight: (!) 315 lb (142.9 kg)  Today's date: 07/29/2018 Total lbs lost to date: 10  ASK: We discussed the diagnosis of obesity with [name] today and Hazely agreed to give [name] permission to discuss obesity behavioral modification therapy today.  ASSESS: Denim has the diagnosis of obesity and her BMI today is 45.2. Anasia is in the action stage of change.   ADVISE: Celeste was educated on the multiple health risks of obesity as well as the benefit of weight loss to improve her health. She was advised of the need for long term treatment and the importance of lifestyle modifications to improve her current health and to decrease her risk of future health problems.  AGREE: Multiple dietary modification options and treatment options were discussed and Shawanda agreed to follow the recommendations documented in the above note.  ARRANGE: Elivia was educated on the importance of frequent visits to treat obesity as outlined per CMS and USPSTF guidelines and agreed to schedule her next follow up appointment today.  Lenward Chancellor, am acting as Location manager for Georgianne Fick, FNP.  I have reviewed the above documentation for accuracy and completeness, and I agree with the above.  - Sevyn Paredez, FNP-C.

## 2018-08-07 ENCOUNTER — Other Ambulatory Visit: Payer: Self-pay | Admitting: Family Medicine

## 2018-08-07 DIAGNOSIS — E876 Hypokalemia: Secondary | ICD-10-CM

## 2018-08-07 DIAGNOSIS — I10 Essential (primary) hypertension: Secondary | ICD-10-CM

## 2018-08-07 DIAGNOSIS — T7840XA Allergy, unspecified, initial encounter: Secondary | ICD-10-CM

## 2018-08-07 MED FILL — metFORMIN HCL 500 MG TABS: 500 | 30 days supply | Qty: 60 | Fill #0

## 2018-08-07 MED FILL — POTASSIUM CL ER 20 MEQ TAB: 20 | 30 days supply | Qty: 120 | Fill #0

## 2018-08-18 ENCOUNTER — Encounter: Payer: Self-pay | Admitting: Family Medicine

## 2018-08-18 ENCOUNTER — Encounter (INDEPENDENT_AMBULATORY_CARE_PROVIDER_SITE_OTHER): Payer: Self-pay | Admitting: Family Medicine

## 2018-08-18 ENCOUNTER — Ambulatory Visit (INDEPENDENT_AMBULATORY_CARE_PROVIDER_SITE_OTHER): Payer: BC Managed Care – PPO | Admitting: Family Medicine

## 2018-08-18 VITALS — BP 156/80 | HR 96 | Temp 98.4°F | Ht 70.0 in | Wt 312.0 lb

## 2018-08-18 DIAGNOSIS — E559 Vitamin D deficiency, unspecified: Secondary | ICD-10-CM | POA: Diagnosis not present

## 2018-08-18 DIAGNOSIS — R7303 Prediabetes: Secondary | ICD-10-CM

## 2018-08-18 DIAGNOSIS — Z9189 Other specified personal risk factors, not elsewhere classified: Secondary | ICD-10-CM | POA: Diagnosis not present

## 2018-08-18 DIAGNOSIS — Z6841 Body Mass Index (BMI) 40.0 and over, adult: Secondary | ICD-10-CM

## 2018-08-18 DIAGNOSIS — I10 Essential (primary) hypertension: Secondary | ICD-10-CM | POA: Diagnosis not present

## 2018-08-18 MED ORDER — VITAMIN D (ERGOCALCIFEROL) 1.25 MG (50000 UNIT) PO CAPS: 50000.0000 [IU] | ORAL_CAPSULE | ORAL | 0 refills | Status: DC

## 2018-08-18 MED FILL — VIT D2 1.25 MG (50,000 UNIT: 1.25 MG | 28 days supply | Qty: 4 | Fill #0

## 2018-08-19 NOTE — Progress Notes (Signed)
Office: [phone]  /  Fax: [phone]   HPI:   Chief Complaint: OBESITY Brandi Lester is here to discuss her progress with her obesity treatment plan. She is on the keep a food journal with 1400-1500 calories and 95 grams of protein daily and is following her eating plan approximately 75% of the time. She states she is exercising 0 minutes 0 times per week. Brandi Lester did well over Thanksgiving with sticking to meal plan. Her weight is (!) 312 lb (141.5 kg) today and has had a weight loss of 3 pounds over a period of 3 weeks since her last visit. She has lost 13 lbs since starting treatment with [name].  Pre-Diabetes Brandi Lester has a diagnosis of pre-diabetes based on her elevated Hgb A1c and was informed this puts her at greater risk of developing diabetes. She is on metformin BID and denies nausea, vomiting, or diarrhea. She continues to work on diet to decrease risk of diabetes. She denies polyphagia or hypoglycemia. Lab Results  Component Value Date   HGBA1C 5.9 (H) 07/14/2018    At risk for diabetes Brandi Lester is at higher than average risk for developing diabetes due to her obesity and pre-diabetes. She currently denies polyuria or polydipsia.  Vitamin D Deficiency Brandi Lester has a diagnosis of vitamin D deficiency. She is currently taking prescription Vit D, but level not at goal. She denies nausea, vomiting or muscle weakness.  Hypertension Brandi Lester is a 52 y.o. female with hypertension. Brandi Lester's blood pressure is elevated. Her primary care physician recently increased Norvasc to BID. She denies chest pain or shortness of breath. She is working weight loss to help control her blood pressure with the goal of decreasing her risk of heart attack and stroke. Brandi Lester's blood pressure is not currently controlled.  ALLERGIES: Allergies  Allergen Reactions  . Penicillins Itching and Swelling    Has patient had a PCN reaction causing immediate rash, facial/tongue/throat swelling, SOB or  lightheadedness with hypotension: yes Has patient had a PCN reaction causing severe rash involving mucus membranes or skin necrosis: no Has patient had a PCN reaction that required hospitalization no Has patient had a PCN reaction occurring within the last 10 years: yes If all of the above answers are "NO", then may proceed with Cephalosporin use.   Brandi Lester [Liraglutide]     Vomiting    MEDICATIONS: Current Outpatient Medications on File Prior to Visit  Medication Sig Dispense Refill  . acetaminophen (TYLENOL 8 HOUR) 650 MG CR tablet Take 1 tablet (650 mg total) every 8 (eight) hours as needed by mouth for pain.    [name] amLODipine (NORVASC) 5 MG tablet Take 1 tablet (5 mg total) by mouth 2 (two) times daily. 180 tablet 1  . aspirin EC 81 MG tablet Take 1 tablet (81 mg total) by mouth daily.    [name] azelastine (ASTELIN) 0.1 % nasal spray Place 2 sprays into both nostrils 2 (two) times daily. Use in each nostril as directed 90 mL 1  . Cyanocobalamin (B-12) 1000 MCG CAPS Take by mouth daily.    [name] EPINEPHrine (EPIPEN 2-PAK) 0.3 mg/0.3 mL IJ SOAJ injection Use as directed for severe allergic reaction 2 Device 1  . fexofenadine (ALLEGRA ALLERGY) 180 MG tablet Take 1 tablet (180 mg total) by mouth daily. 301 tablet 1  . fluticasone (FLONASE) 50 MCG/ACT nasal spray Place 2 sprays into both nostrils daily. (Patient taking differently: Place 2 sprays into both nostrils daily as needed for allergies. ) 16 g 11  .  glucose blood test strip OneTouch Ultra Test strips    . ibuprofen (ADVIL,MOTRIN) 600 MG tablet Take 1 tablet (600 mg total) by mouth every 6 (six) hours as needed. 90 tablet 0  . lisinopril-hydrochlorothiazide (ZESTORETIC) 20-12.5 MG tablet Take 1 tablet by mouth 2 (two) times daily. 180 tablet 1  . losartan-hydrochlorothiazide (HYZAAR) 100-25 MG tablet TAKE 1 TABLET BY MOUTH DAILY. 90 tablet 1  . metFORMIN (GLUCOPHAGE) 500 MG tablet Take 1 tablet (500 mg total) by mouth 2 (two) times daily with  a meal. Take with breakfast and lunch 60 tablet 0  . montelukast (SINGULAIR) 10 MG tablet Take 1 tablet (10 mg total) by mouth at bedtime as needed. 90 tablet 1  . Omega-3 Fatty Acids (FISH OIL PO) Take 1 capsule by mouth daily.    Brandi Lester 29J MISC OneTouch Delica Lester 33 gauge    . pantoprazole (PROTONIX) 40 MG tablet TAKE 1 TABLET (40 MG TOTAL) BY MOUTH DAILY. 90 tablet 1  . potassium chloride SA (K-DUR,KLOR-CON) 20 MEQ tablet TAKE 1 TABLET (20 MEQ TOTAL) 4 (FOUR) TIMES DAILY BY MOUTH. 120 tablet 3  . Probiotic Product (PROBIOTIC-10 PO) Take by mouth daily.    . TURMERIC PO Take 1 capsule by mouth daily.     No current facility-administered medications on file prior to visit.     PAST MEDICAL HISTORY: Past Medical History:  Diagnosis Date  . ADD (attention deficit disorder)   . Allergy    allergic rhinitis  . Anemia 07/25/2013  . Angio-edema   . Back pain   . Costochondritis 02/20/2015  . Diabetes mellitus    Type II   previously 3 years ago - no longer on meds   . Dyslipidemia 07/25/2013  . Fungus infection 10/13   Left great toe  . GERD (gastroesophageal reflux disease)   . Hypertension   . IBS (irritable bowel syndrome)   . Ingrown nail 10/13   right foot next to the last toe  . Insomnia 04/20/2013  . Joint pain   . Lactose intolerance   . Leg edema   . Obesity   . Pedal edema 02/20/2015  . Prediabetes   . Preventative health care 09/25/2015  . PVC (premature ventricular contraction)     PAST SURGICAL HISTORY: Past Surgical History:  Procedure Laterality Date  . HYSTEROSCOPY N/A 10/04/2015   Procedure: HYSTEROSCOPY with Removal IUD;  Surgeon: Vanessa Kick, MD;  Location: [facility];  Service: Gynecology;  Laterality: N/A;  . lasik     eye surgery  . WISDOM TOOTH EXTRACTION      SOCIAL HISTORY: Social History   Tobacco Use  . Smoking status: Former Smoker    Packs/day: 1.00    Years: 15.00    Pack years: 15.00    Last attempt to quit:  09/03/2001    Years since quitting: 16.9  . Smokeless tobacco: Never Used  Substance Use Topics  . Alcohol use: Yes    Comment: rare  . Drug use: No    FAMILY HISTORY: Family History  Problem Relation Age of Onset  . Heart attack Mother 33  . Hypertension Mother   . Heart disease Mother   . Obesity Mother   . Cancer Father        prostate  . Heart attack Sister 32  . Hypertension Other     ROS: Review of Systems  Constitutional: Positive for weight loss.  Respiratory: Negative for shortness of breath.   Cardiovascular: Negative for  chest pain.  Gastrointestinal: Negative for diarrhea, nausea and vomiting.  Genitourinary: Negative for frequency.  Musculoskeletal:       Negative muscle weakness  Endo/Heme/Allergies: Negative for polydipsia.       Negative polyphagia Negative hypoglycemia    PHYSICAL EXAM: Blood pressure (!) 156/80, pulse 96, temperature 98.4 F (36.9 C), temperature source Oral, height 5\' 10"  (1.778 m), weight (!) 312 lb (141.5 kg), SpO2 99 %. Body mass index is 44.77 kg/m. Physical Exam Vitals signs reviewed.  Constitutional:      Appearance: Normal appearance. She is obese.  Cardiovascular:     Rate and Rhythm: Normal rate.  Pulmonary:     Effort: Pulmonary effort is normal.  Musculoskeletal: Normal range of motion.  Skin:    General: Skin is warm and dry.  Neurological:     Mental Status: She is alert and oriented to person, place, and time.  Psychiatric:        Mood and Affect: Mood normal.        Behavior: Behavior normal.     RECENT LABS AND TESTS: BMET    Component Value Date/Time   NA 139 07/14/2018 1109   K 3.8 07/14/2018 1109   CL 100 07/14/2018 1109   CO2 25 07/14/2018 1109   GLUCOSE 86 07/14/2018 1109   GLUCOSE 86 03/13/2018 1152   BUN 9 07/14/2018 1109   CREATININE 0.74 07/14/2018 1109   CREATININE 0.72 02/03/2014 1648   CALCIUM 9.2 07/14/2018 1109   GFRNONAA 95 07/14/2018 1109   GFRAA 109 07/14/2018 1109   Lab  Results  Component Value Date   HGBA1C 5.9 (H) 07/14/2018   HGBA1C 5.9 03/13/2018   HGBA1C 5.8 (H) 09/12/2017   HGBA1C 6.2 (H) 05/23/2017   HGBA1C 6.6 (H) 03/15/2017   Lab Results  Component Value Date   INSULIN 15.4 07/14/2018   INSULIN 12.9 09/12/2017   INSULIN 31.1 (H) 05/23/2017   CBC    Component Value Date/Time   WBC 5.9 03/13/2018 1152   RBC 4.35 03/13/2018 1152   HGB 11.9 (L) 03/13/2018 1152   HGB 12.1 05/23/2017 1025   HCT 35.9 (L) 03/13/2018 1152   HCT 36.6 05/23/2017 1025   PLT 283.0 03/13/2018 1152   MCV 82.4 03/13/2018 1152   MCV 82 05/23/2017 1025   MCH 27.1 05/23/2017 1025   MCH 28.2 02/03/2014 1648   MCHC 33.2 03/13/2018 1152   RDW 15.0 03/13/2018 1152   RDW 14.9 05/23/2017 1025   LYMPHSABS 1.7 05/23/2017 1025   MONOABS 0.4 06/09/2014 0843   EOSABS 0.1 05/23/2017 1025   BASOSABS 0.0 05/23/2017 1025   Iron/TIBC/Ferritin/ %Sat No results found for: IRON, TIBC, FERRITIN, IRONPCTSAT Lipid Panel     Component Value Date/Time   CHOL 140 03/13/2018 1152   CHOL 132 09/12/2017 0927   TRIG 66.0 03/13/2018 1152   HDL 40.00 03/13/2018 1152   HDL 32 (L) 09/12/2017 0927   CHOLHDL 3 03/13/2018 1152   VLDL 13.2 03/13/2018 1152   LDLCALC 86 03/13/2018 1152   LDLCALC 81 09/12/2017 0927   Hepatic Function Panel     Component Value Date/Time   PROT 7.2 07/14/2018 1109   ALBUMIN 4.2 07/14/2018 1109   AST 8 07/14/2018 1109   ALT 8 07/14/2018 1109   ALKPHOS 83 07/14/2018 1109   BILITOT 0.3 07/14/2018 1109   BILIDIR 0.0 06/09/2014 0843   IBILI 0.1 (L) 02/03/2014 1648      Component Value Date/Time   TSH 2.10 03/13/2018 1152  TSH 1.710 05/23/2017 1025   TSH 1.26 03/15/2017 1140   Results for ERIN, OBANDO (MRN [id number]) as of 08/19/2018 14:25  Ref. Range 07/14/2018 11:09  Vitamin D, 25-Hydroxy Latest Ref Range: 30.0 - 100.0 ng/mL 45.2   ASSESSMENT AND PLAN: Vitamin D deficiency - Plan: Vitamin D, Ergocalciferol, (DRISDOL) 1.25 MG (50000 UT) CAPS  capsule  Prediabetes  Essential hypertension  At risk for diabetes mellitus  Class 3 severe obesity with serious comorbidity and body mass index (BMI) of 40.0 to 44.9 in adult, unspecified obesity type ([place])  PLAN:  Pre-Diabetes Mashell will continue to work on weight loss, exercise, and decreasing simple carbohydrates in her diet to help decrease the risk of diabetes. We dicussed metformin including benefits and risks. She was informed that eating too many simple carbohydrates or too many calories at one sitting increases the likelihood of GI side effects. Gionni agrees to continue taking metformin and she agrees to follow up with our clinic in 2 to 3 weeks as directed to monitor her progress.  Diabetes risk counselling Joda was given extended (15 minutes) diabetes prevention counseling today. She is 52 y.o. female and has risk factors for diabetes including obesity and pre-diabetes. We discussed intensive lifestyle modifications today with an emphasis on weight loss as well as increasing exercise and decreasing simple carbohydrates in her diet.  Vitamin D Deficiency Ellamay was informed that low vitamin D levels contributes to fatigue and are associated with obesity, breast, and colon cancer. Brandi Lester agrees to continue taking prescription Vit D @50 ,000 IU every week #4 and we will refill for 1 month. She will follow up for routine testing of vitamin D, at least 2-3 times per year. She was informed of the risk of over-replacement of vitamin D and agrees to not increase her dose unless she discusses this with [name] first. Brandi Lester agrees to follow up with our clinic in 2 to 3 weeks.  Hypertension We discussed sodium restriction, working on healthy weight loss, and a regular exercise program as the means to achieve improved blood pressure control. Brandi Lester agreed with this plan and agreed to follow up as directed. We will continue to monitor her blood pressure as well as her progress with the  above lifestyle modifications. We will continue to monitor and Brandi Lester agrees to continue taking Norvasc and losartan-hydrochlorothiazide. She will watch for signs of hypotension as she continues her lifestyle modifications. Brandi Lester agrees to follow up with our clinic in 2 to 3 weeks.  Obesity Brandi Lester is currently in the action stage of change. As such, her goal is to continue with weight loss efforts She has agreed to keep a food journal with 1400-1500 calories and 95 grams of protein daily Brandi Lester has not been prescribed exercise at this time. We discussed the following Behavioral Modification Strategies today: holiday eating strategies and planning for success   Brandi Lester has agreed to follow up with our clinic in 2 to 3 weeks. She was informed of the importance of frequent follow up visits to maximize her success with intensive lifestyle modifications for her multiple health conditions.   OBESITY BEHAVIORAL INTERVENTION VISIT  Today's visit was # 27  Starting weight: 325 lbs Starting date: 05/23/17 Today's weight : 312 lbs Today's date: 08/18/2018 Total lbs lost to date: 53    ASK: We discussed the diagnosis of obesity with Brandi Lester today and Brandi Lester agreed to give [name] permission to discuss obesity behavioral modification therapy today.  ASSESS: Darienne has the diagnosis of obesity and  her BMI today is 44.77 Dominic is in the action stage of change   ADVISE: Elga was educated on the multiple health risks of obesity as well as the benefit of weight loss to improve her health. She was advised of the need for long term treatment and the importance of lifestyle modifications to improve her current health and to decrease her risk of future health problems.  AGREE: Multiple dietary modification options and treatment options were discussed and  Zyra agreed to follow the recommendations documented in the above note.  ARRANGE: Anny was educated on the importance of  frequent visits to treat obesity as outlined per CMS and USPSTF guidelines and agreed to schedule her next follow up appointment today.  Wilhemena Durie, am acting as Location manager for [employer], FNP-C.  I have reviewed the above documentation for accuracy and completeness, and I agree with the above.  - Manual Navarra, FNP-C.

## 2018-08-20 ENCOUNTER — Encounter (INDEPENDENT_AMBULATORY_CARE_PROVIDER_SITE_OTHER): Payer: Self-pay | Admitting: Family Medicine

## 2018-08-20 DIAGNOSIS — R7303 Prediabetes: Secondary | ICD-10-CM | POA: Insufficient documentation

## 2018-09-10 ENCOUNTER — Ambulatory Visit (INDEPENDENT_AMBULATORY_CARE_PROVIDER_SITE_OTHER): Payer: BC Managed Care – PPO | Admitting: Family Medicine

## 2018-09-10 ENCOUNTER — Encounter (INDEPENDENT_AMBULATORY_CARE_PROVIDER_SITE_OTHER): Payer: Self-pay | Admitting: Family Medicine

## 2018-09-10 VITALS — BP 139/74 | HR 101 | Temp 99.2°F | Ht 70.0 in | Wt 315.0 lb

## 2018-09-10 DIAGNOSIS — E119 Type 2 diabetes mellitus without complications: Secondary | ICD-10-CM | POA: Diagnosis not present

## 2018-09-10 DIAGNOSIS — Z9189 Other specified personal risk factors, not elsewhere classified: Secondary | ICD-10-CM | POA: Diagnosis not present

## 2018-09-10 DIAGNOSIS — E559 Vitamin D deficiency, unspecified: Secondary | ICD-10-CM

## 2018-09-10 DIAGNOSIS — Z6841 Body Mass Index (BMI) 40.0 and over, adult: Secondary | ICD-10-CM

## 2018-09-10 MED ORDER — VITAMIN D (ERGOCALCIFEROL) 1.25 MG (50000 UNIT) PO CAPS: 50000.0000 [IU] | ORAL_CAPSULE | ORAL | 0 refills | Status: DC

## 2018-09-10 MED FILL — VIT D2 1.25 MG (50,000 UNIT: 1.25 MG | 28 days supply | Qty: 4 | Fill #0

## 2018-09-11 NOTE — Progress Notes (Signed)
Office: [phone]  /  Fax: [phone]   HPI:   Chief Complaint: OBESITY Brandi Lester is here to discuss her progress with her obesity treatment plan. She is on the  keep a food journal with 1400 to 1500 calories and 95 grams of protein and is following her eating plan approximately 75 % of the time. She states she is exercising 0 minutes 0 times per week. Brandi Lester is back on track after the holidays.  Her weight is (!) 315 lb (142.9 kg) today and has had a weight gain of 3 pounds over a period of 3 weeks since her last visit. She has lost 10 lbs since starting treatment with [name].  Vitamin D deficiency Brandi Lester has a diagnosis of vitamin D deficiency. She is currently taking vit D and is not at goal. She denies nausea, vomiting, or muscle weakness.  At risk for osteopenia and osteoporosis Brandi Lester is at higher risk of osteopenia and osteoporosis due to vitamin D deficiency.   Diabetes II Brandi Lester has a diagnosis of diabetes type II. Brandi Lester states that she rarely checks her blood sugars. She is on metformin and is well controlled. When she took metformin 2 times per day, it caused GI upset, so she is only taking it once a day. Her last A1c was 5.9 on 07/14/18.She has been working on intensive lifestyle modifications including diet, exercise, and weight loss to help control her blood glucose levels.  ASSESSMENT AND PLAN:  Vitamin D deficiency - Plan: Vitamin D, Ergocalciferol, (DRISDOL) 1.25 MG (50000 UT) CAPS capsule  Type 2 diabetes mellitus without complication, without long-term current use of insulin (HCC)  At risk for osteoporosis  Class 3 severe obesity with serious comorbidity and body mass index (BMI) of 45.0 to 49.9 in adult, unspecified obesity type ([place])  PLAN:  Vitamin D Deficiency Brandi Lester was informed that low vitamin D levels contributes to fatigue and are associated with obesity, breast, and colon cancer. She agrees to continue to take prescription Vit D @50 ,000 IU every  week #4 with no refills and will follow up for routine testing of vitamin D, at least 2-3 times per year. She was informed of the risk of over-replacement of vitamin D and agrees to not increase her dose unless she discusses this with [name] first. Brandi Lester agrees to follow up in 3 weeks.  At risk for osteopenia and osteoporosis Brandi Lester was given extended (15 minutes) osteoporosis prevention counseling today. Brandi Lester is at risk for osteopenia and osteoporosis due to her vitamin D deficiency. She was encouraged to take her vitamin D and follow her higher calcium diet and increase strengthening exercise to help strengthen her bones and decrease her risk of osteopenia and osteoporosis.  Diabetes II Brandi Lester has been given extensive diabetes education by myself today including ideal fasting and post-prandial blood glucose readings, individual ideal Hgb A1c goals, and hypoglycemia prevention. We discussed the importance of good blood sugar control to decrease the likelihood of diabetic complications such as nephropathy, neuropathy, limb loss, blindness, coronary artery disease, and death. We discussed the importance of intensive lifestyle modification including diet, exercise and weight loss as the first line treatment for diabetes. Brandi Lester agrees to continue her metformin. We will decrease dose to once daily since twice daily is causing GI upset.  Brandi Lester agrees to follow up in 3 weeks.  Obesity Brandi Lester is currently in the action stage of change. As such, her goal is to continue with weight loss efforts. She has agreed to keep a food journal with  1400 to 1500 calories and 95 grams of protein.  Brandi Lester has been instructed to do cardio or resistance training 4 to 5 times per week for 45 minutes. We discussed the following Behavioral Modification Strategies today: increasing lean protein intake, increase H2O intake, keep a strict food journal,  and work on meal planning and easy cooking plans  Brandi Lester has agreed  to follow up with our clinic in 3 weeks. She was informed of the importance of frequent follow up visits to maximize her success with intensive lifestyle modifications for her multiple health conditions.  ALLERGIES: Allergies  Allergen Reactions  . Penicillins Itching and Swelling    Has patient had a PCN reaction causing immediate rash, facial/tongue/throat swelling, SOB or lightheadedness with hypotension: yes Has patient had a PCN reaction causing severe rash involving mucus membranes or skin necrosis: no Has patient had a PCN reaction that required hospitalization no Has patient had a PCN reaction occurring within the last 10 years: yes If all of the above answers are "NO", then may proceed with Cephalosporin use.   Brandi Lester [Liraglutide]     Vomiting    MEDICATIONS: Current Outpatient Medications on File Prior to Visit  Medication Sig Dispense Refill  . acetaminophen (TYLENOL 8 HOUR) 650 MG CR tablet Take 1 tablet (650 mg total) every 8 (eight) hours as needed by mouth for pain.    [name] aspirin EC 81 MG tablet Take 1 tablet (81 mg total) by mouth daily.    [name] azelastine (ASTELIN) 0.1 % nasal spray Place 2 sprays into both nostrils 2 (two) times daily. Use in each nostril as directed 90 mL 1  . Cyanocobalamin (B-12) 1000 MCG CAPS Take by mouth daily.    [name] EPINEPHrine (EPIPEN 2-PAK) 0.3 mg/0.3 mL IJ SOAJ injection Use as directed for severe allergic reaction 2 Device 1  . fexofenadine (ALLEGRA ALLERGY) 180 MG tablet Take 1 tablet (180 mg total) by mouth daily. 301 tablet 1  . fluticasone (FLONASE) 50 MCG/ACT nasal spray Place 2 sprays into both nostrils daily. (Patient taking differently: Place 2 sprays into both nostrils daily as needed for allergies. ) 16 g 11  . glucose blood test strip OneTouch Ultra Test strips    . ibuprofen (ADVIL,MOTRIN) 600 MG tablet Take 1 tablet (600 mg total) by mouth every 6 (six) hours as needed. 90 tablet 0  . montelukast (SINGULAIR) 10 MG tablet Take 1  tablet (10 mg total) by mouth at bedtime as needed. 90 tablet 1  . Omega-3 Fatty Acids (FISH OIL PO) Take 1 capsule by mouth daily.    [name] DELICA LANCETS 42V MISC OneTouch Delica Lancets 33 gauge    . pantoprazole (PROTONIX) 40 MG tablet TAKE 1 TABLET (40 MG TOTAL) BY MOUTH DAILY. 90 tablet 1  . Probiotic Product (PROBIOTIC-10 PO) Take by mouth daily.    . TURMERIC PO Take 1 capsule by mouth daily.     No current facility-administered medications on file prior to visit.     PAST MEDICAL HISTORY: Past Medical History:  Diagnosis Date  . ADD (attention deficit disorder)   . Allergy    allergic rhinitis  . Anemia 07/25/2013  . Angio-edema   . Back pain   . Costochondritis 02/20/2015  . Diabetes mellitus    Type II   previously 3 years ago - no longer on meds   . Dyslipidemia 07/25/2013  . Fungus infection 10/13   Left great toe  . GERD (gastroesophageal reflux disease)   .  Hypertension   . IBS (irritable bowel syndrome)   . Ingrown nail 10/13   right foot next to the last toe  . Insomnia 04/20/2013  . Joint pain   . Lactose intolerance   . Leg edema   . Obesity   . Pedal edema 02/20/2015  . Prediabetes   . Preventative health care 09/25/2015  . PVC (premature ventricular contraction)     PAST SURGICAL HISTORY: Past Surgical History:  Procedure Laterality Date  . HYSTEROSCOPY N/A 10/04/2015   Procedure: HYSTEROSCOPY with Removal IUD;  Surgeon: Vanessa Kick, MD;  Location: [facility];  Service: Gynecology;  Laterality: N/A;  . lasik     eye surgery  . WISDOM TOOTH EXTRACTION      SOCIAL HISTORY: Social History   Tobacco Use  . Smoking status: Former Smoker    Packs/day: 1.00    Years: 15.00    Pack years: 15.00    Last attempt to quit: 09/03/2001    Years since quitting: 17.0  . Smokeless tobacco: Never Used  Substance Use Topics  . Alcohol use: Yes    Comment: rare  . Drug use: No    FAMILY HISTORY: Family History  Problem Relation Age of Onset  . Heart  attack Mother 6  . Hypertension Mother   . Heart disease Mother   . Obesity Mother   . Cancer Father        prostate  . Heart attack Sister 8  . Hypertension Other     ROS: Review of Systems  Constitutional: Negative for weight loss.  Gastrointestinal: Negative for nausea and vomiting.  Musculoskeletal:       Negative for muscle weakness.    PHYSICAL EXAM: Blood pressure 139/74, pulse (!) 101, temperature 99.2 F (37.3 C), temperature source Oral, height 5\' 10"  (1.778 m), weight (!) 315 lb (142.9 kg), SpO2 99 %. Body mass index is 45.2 kg/m. Physical Exam Vitals signs reviewed.  Constitutional:      Appearance: Normal appearance. She is obese.  Cardiovascular:     Rate and Rhythm: Normal rate.  Pulmonary:     Effort: Pulmonary effort is normal.  Musculoskeletal: Normal range of motion.  Skin:    General: Skin is warm and dry.  Neurological:     Mental Status: She is alert and oriented to person, place, and time.  Psychiatric:        Mood and Affect: Mood normal.        Behavior: Behavior normal.     RECENT LABS AND TESTS: BMET    Component Value Date/Time   NA 139 07/14/2018 1109   K 3.8 07/14/2018 1109   CL 100 07/14/2018 1109   CO2 25 07/14/2018 1109   GLUCOSE 86 07/14/2018 1109   GLUCOSE 86 03/13/2018 1152   BUN 9 07/14/2018 1109   CREATININE 0.74 07/14/2018 1109   CREATININE 0.72 02/03/2014 1648   CALCIUM 9.2 07/14/2018 1109   GFRNONAA 95 07/14/2018 1109   GFRAA 109 07/14/2018 1109   Lab Results  Component Value Date   HGBA1C 5.9 (H) 07/14/2018   HGBA1C 5.9 03/13/2018   HGBA1C 5.8 (H) 09/12/2017   HGBA1C 6.2 (H) 05/23/2017   HGBA1C 6.6 (H) 03/15/2017   Lab Results  Component Value Date   INSULIN 15.4 07/14/2018   INSULIN 12.9 09/12/2017   INSULIN 31.1 (H) 05/23/2017   CBC    Component Value Date/Time   WBC 5.9 03/13/2018 1152   RBC 4.35 03/13/2018 1152   HGB 11.9 (L)  03/13/2018 1152   HGB 12.1 05/23/2017 1025   HCT 35.9 (L)  03/13/2018 1152   HCT 36.6 05/23/2017 1025   PLT 283.0 03/13/2018 1152   MCV 82.4 03/13/2018 1152   MCV 82 05/23/2017 1025   MCH 27.1 05/23/2017 1025   MCH 28.2 02/03/2014 1648   MCHC 33.2 03/13/2018 1152   RDW 15.0 03/13/2018 1152   RDW 14.9 05/23/2017 1025   LYMPHSABS 1.7 05/23/2017 1025   MONOABS 0.4 06/09/2014 0843   EOSABS 0.1 05/23/2017 1025   BASOSABS 0.0 05/23/2017 1025   Iron/TIBC/Ferritin/ %Sat No results found for: IRON, TIBC, FERRITIN, IRONPCTSAT Lipid Panel     Component Value Date/Time   CHOL 140 03/13/2018 1152   CHOL 132 09/12/2017 0927   TRIG 66.0 03/13/2018 1152   HDL 40.00 03/13/2018 1152   HDL 32 (L) 09/12/2017 0927   CHOLHDL 3 03/13/2018 1152   VLDL 13.2 03/13/2018 1152   LDLCALC 86 03/13/2018 1152   LDLCALC 81 09/12/2017 0927   Hepatic Function Panel     Component Value Date/Time   PROT 7.2 07/14/2018 1109   ALBUMIN 4.2 07/14/2018 1109   AST 8 07/14/2018 1109   ALT 8 07/14/2018 1109   ALKPHOS 83 07/14/2018 1109   BILITOT 0.3 07/14/2018 1109   BILIDIR 0.0 06/09/2014 0843   IBILI 0.1 (L) 02/03/2014 1648      Component Value Date/Time   TSH 2.10 03/13/2018 1152   TSH 1.710 05/23/2017 1025   TSH 1.26 03/15/2017 1140   Results for MAHUM, BETTEN (MRN [id number]) as of 09/11/2018 14:57  Ref. Range 07/14/2018 11:09  Vitamin D, 25-Hydroxy Latest Ref Range: 30.0 - 100.0 ng/mL 45.2    OBESITY BEHAVIORAL INTERVENTION VISIT  Today's visit was # 28    Starting weight: 325 lbs Starting date: 05/23/17 Today's weight : Weight: (!) 315 lb (142.9 kg)  Today's date: 09/10/2018 Total lbs lost to date: 10  ASK: We discussed the diagnosis of obesity with [name] today and Amberlin agreed to give [name] permission to discuss obesity behavioral modification therapy today.  ASSESS: Mckennah has the diagnosis of obesity and her BMI today is 45.2 Tyechia is in the action stage of change.   ADVISE: Harlee was educated on the multiple health risks of  obesity as well as the benefit of weight loss to improve her health. She was advised of the need for long term treatment and the importance of lifestyle modifications to improve her current health and to decrease her risk of future health problems.  AGREE: Multiple dietary modification options and treatment options were discussed and Telly agreed to follow the recommendations documented in the above note.  ARRANGE: Lindia was educated on the importance of frequent visits to treat obesity as outlined per CMS and USPSTF guidelines and agreed to schedule her next follow up appointment today.  I, Marcille Blanco, am acting as Location manager for [employer], FNP-C.  I have reviewed the above documentation for accuracy and completeness, and I agree with the above.  - Chae Shuster, FNP-C.

## 2018-09-15 ENCOUNTER — Ambulatory Visit: Payer: BC Managed Care – PPO | Admitting: Family Medicine

## 2018-09-15 VITALS — BP 132/82 | HR 95 | Temp 98.7°F | Resp 18 | Wt 320.6 lb

## 2018-09-15 DIAGNOSIS — E119 Type 2 diabetes mellitus without complications: Secondary | ICD-10-CM

## 2018-09-15 DIAGNOSIS — M5441 Lumbago with sciatica, right side: Secondary | ICD-10-CM

## 2018-09-15 DIAGNOSIS — E559 Vitamin D deficiency, unspecified: Secondary | ICD-10-CM

## 2018-09-15 DIAGNOSIS — T7840XA Allergy, unspecified, initial encounter: Secondary | ICD-10-CM

## 2018-09-15 DIAGNOSIS — I10 Essential (primary) hypertension: Secondary | ICD-10-CM

## 2018-09-15 DIAGNOSIS — E669 Obesity, unspecified: Secondary | ICD-10-CM

## 2018-09-15 DIAGNOSIS — D219 Benign neoplasm of connective and other soft tissue, unspecified: Secondary | ICD-10-CM

## 2018-09-15 DIAGNOSIS — E876 Hypokalemia: Secondary | ICD-10-CM

## 2018-09-15 DIAGNOSIS — E1169 Type 2 diabetes mellitus with other specified complication: Secondary | ICD-10-CM

## 2018-09-15 MED ORDER — AMLODIPINE BESYLATE 10 MG PO TABS: 10.0000 mg | ORAL_TABLET | Freq: Every day | ORAL | 3 refills | Status: DC

## 2018-09-15 MED ORDER — AMLODIPINE BESYLATE 5 MG PO TABS: 5.0000 mg | ORAL_TABLET | Freq: Two times a day (BID) | ORAL | 1 refills | Status: DC

## 2018-09-15 MED ORDER — LOSARTAN POTASSIUM 100 MG PO TABS: 100.0000 mg | ORAL_TABLET | Freq: Every day | ORAL | 1 refills | Status: DC

## 2018-09-15 MED ORDER — HYDROCHLOROTHIAZIDE 25 MG PO TABS: 25.0000 mg | ORAL_TABLET | Freq: Every day | ORAL | 1 refills | Status: DC

## 2018-09-15 MED ORDER — METFORMIN HCL 500 MG PO TABS: 500.0000 mg | ORAL_TABLET | Freq: Every day | ORAL | 1 refills | Status: DC

## 2018-09-15 MED ORDER — POTASSIUM CHLORIDE CRYS ER 20 MEQ PO TBCR: 20.0000 meq | EXTENDED_RELEASE_TABLET | Freq: Three times a day (TID) | ORAL | 1 refills | Status: DC

## 2018-09-15 MED FILL — metFORMIN HCL 500 MG TABS: 500 | 90 days supply | Qty: 90 | Fill #0

## 2018-09-15 MED FILL — LOSARTAN POTASSIUM 100 MG T: 100 | 90 days supply | Qty: 90 | Fill #0

## 2018-09-15 MED FILL — POTASSIUM CHLORIDE CRYS ER: 20 | 90 days supply | Qty: 270 | Fill #0

## 2018-09-15 MED FILL — HYDROCHLOROTHIAZIDE 25 MG T: 25 | 90 days supply | Qty: 90 | Fill #0

## 2018-09-15 MED FILL — AMLODIPINE BESYLATE 10 MG T: 10 | 90 days supply | Qty: 90 | Fill #0

## 2018-09-15 NOTE — Assessment & Plan Note (Signed)
Refilled potassium today

## 2018-09-15 NOTE — Assessment & Plan Note (Signed)
Encouraged moist heat and gentle stretching as tolerated. May try NSAIDs and prescription meds as directed and report if symptoms worsen or seek immediate care. Struggling with activity due ot pain.

## 2018-09-15 NOTE — Assessment & Plan Note (Signed)
Supplement and monitor 

## 2018-09-15 NOTE — Assessment & Plan Note (Signed)
Well controlled, no changes to meds. Encouraged heart healthy diet such as the DASH diet and exercise as tolerated. Is having labile systolic BP 470R to 615H will separate her Losartan and hct and have her do the Losartan qhs and the hctz in am and she will monitor. Continue Amlodipine bid

## 2018-09-15 NOTE — Assessment & Plan Note (Signed)
hgba1c acceptable, minimize simple carbs. Increase exercise as tolerated. Continue current meds 

## 2018-09-15 NOTE — Patient Instructions (Signed)

## 2018-09-15 NOTE — Assessment & Plan Note (Signed)
And endometrial thickening. Had a recent biopsy which was benign and they have her on progesterone with improved bleeding concerns. She is referred to GYN. Was previously with [facility] but Dr Leo Grosser retired.

## 2018-09-15 NOTE — Progress Notes (Signed)
Subjective:    Patient ID: Brandi Lester, female    DOB: 17-Mar-1966, 53 y.o.   MRN: [id number]  No chief complaint on file.   HPI Patient is in today for follow up. She is still seeing labile systolic blood pressures but they have improved some since Amlodipine was made bid. Numbers top out in 150s instead of 170s. No polyuria or polydipsia. No recent febrile illness or hospitalizations  Past Medical History:  Diagnosis Date  . ADD (attention deficit disorder)   . Allergy    allergic rhinitis  . Anemia 07/25/2013  . Angio-edema   . Back pain   . Costochondritis 02/20/2015  . Diabetes mellitus    Type II   previously 3 years ago - no longer on meds   . Dyslipidemia 07/25/2013  . Fungus infection 10/13   Left great toe  . GERD (gastroesophageal reflux disease)   . Hypertension   . IBS (irritable bowel syndrome)   . Ingrown nail 10/13   right foot next to the last toe  . Insomnia 04/20/2013  . Joint pain   . Lactose intolerance   . Leg edema   . Obesity   . Pedal edema 02/20/2015  . Prediabetes   . Preventative health care 09/25/2015  . PVC (premature ventricular contraction)     Past Surgical History:  Procedure Laterality Date  . HYSTEROSCOPY N/A 10/04/2015   Procedure: HYSTEROSCOPY with Removal IUD;  Surgeon: Vanessa Kick, MD;  Location: [facility];  Service: Gynecology;  Laterality: N/A;  . lasik     eye surgery  . WISDOM TOOTH EXTRACTION      Family History  Problem Relation Age of Onset  . Heart attack Mother 58  . Hypertension Mother   . Heart disease Mother   . Obesity Mother   . Cancer Father        prostate  . Heart attack Sister 39  . Hypertension Other     Social History   Socioeconomic History  . Marital status: Single    Spouse name: Not on file  . Number of children: Not on file  . Years of education: Not on file  . Highest education level: Not on file  Occupational History  . Occupation: Pharmacist, hospital, [employer] manager  Social Needs  . Financial  resource strain: Not on file  . Food insecurity:    Worry: Not on file    Inability: Not on file  . Transportation needs:    Medical: Not on file    Non-medical: Not on file  Tobacco Use  . Smoking status: Former Smoker    Packs/day: 1.00    Years: 15.00    Pack years: 15.00    Last attempt to quit: 09/03/2001    Years since quitting: 17.0  . Smokeless tobacco: Never Used  Substance and Sexual Activity  . Alcohol use: Yes    Comment: rare  . Drug use: No  . Sexual activity: Not on file  Lifestyle  . Physical activity:    Days per week: Not on file    Minutes per session: Not on file  . Stress: Not on file  Relationships  . Social connections:    Talks on phone: Not on file    Gets together: Not on file    Attends religious service: Not on file    Active member of club or organization: Not on file    Attends meetings of clubs or organizations: Not on file    Relationship  status: Not on file  . Intimate partner violence:    Fear of current or ex partner: Not on file    Emotionally abused: Not on file    Physically abused: Not on file    Forced sexual activity: Not on file  Other Topics Concern  . Not on file  Social History Narrative  . Not on file    Outpatient Medications Prior to Visit  Medication Sig Dispense Refill  . acetaminophen (TYLENOL 8 HOUR) 650 MG CR tablet Take 1 tablet (650 mg total) every 8 (eight) hours as needed by mouth for pain.    [name] aspirin EC 81 MG tablet Take 1 tablet (81 mg total) by mouth daily.    [name] azelastine (ASTELIN) 0.1 % nasal spray Place 2 sprays into both nostrils 2 (two) times daily. Use in each nostril as directed 90 mL 1  . Cyanocobalamin (B-12) 1000 MCG CAPS Take by mouth daily.    [name] EPINEPHrine (EPIPEN 2-PAK) 0.3 mg/0.3 mL IJ SOAJ injection Use as directed for severe allergic reaction 2 Device 1  . fexofenadine (ALLEGRA ALLERGY) 180 MG tablet Take 1 tablet (180 mg total) by mouth daily. 301 tablet 1  . fluticasone (FLONASE) 50  MCG/ACT nasal spray Place 2 sprays into both nostrils daily. (Patient taking differently: Place 2 sprays into both nostrils daily as needed for allergies. ) 16 g 11  . glucose blood test strip OneTouch Ultra Test strips    . ibuprofen (ADVIL,MOTRIN) 600 MG tablet Take 1 tablet (600 mg total) by mouth every 6 (six) hours as needed. 90 tablet 0  . montelukast (SINGULAIR) 10 MG tablet Take 1 tablet (10 mg total) by mouth at bedtime as needed. 90 tablet 1  . Omega-3 Fatty Acids (FISH OIL PO) Take 1 capsule by mouth daily.    [name] DELICA LANCETS 40J MISC OneTouch Delica Lancets 33 gauge    . pantoprazole (PROTONIX) 40 MG tablet TAKE 1 TABLET (40 MG TOTAL) BY MOUTH DAILY. 90 tablet 1  . Probiotic Product (PROBIOTIC-10 PO) Take by mouth daily.    . TURMERIC PO Take 1 capsule by mouth daily.    . Vitamin D, Ergocalciferol, (DRISDOL) 1.25 MG (50000 UT) CAPS capsule Take 1 capsule (50,000 Units total) by mouth every 7 (seven) days. 4 capsule 0  . amLODipine (NORVASC) 5 MG tablet Take 1 tablet (5 mg total) by mouth 2 (two) times daily. 180 tablet 1  . lisinopril-hydrochlorothiazide (ZESTORETIC) 20-12.5 MG tablet Take 1 tablet by mouth 2 (two) times daily. 180 tablet 1  . losartan-hydrochlorothiazide (HYZAAR) 100-25 MG tablet TAKE 1 TABLET BY MOUTH DAILY. 90 tablet 1  . metFORMIN (GLUCOPHAGE) 500 MG tablet Take 1 tablet (500 mg total) by mouth 2 (two) times daily with a meal. Take with breakfast and lunch (Patient taking differently: Take 500 mg by mouth daily with breakfast. ) 60 tablet 0  . potassium chloride SA (K-DUR,KLOR-CON) 20 MEQ tablet TAKE 1 TABLET (20 MEQ TOTAL) 4 (FOUR) TIMES DAILY BY MOUTH. 120 tablet 3  . medroxyPROGESTERone (PROVERA) 10 MG tablet   6   No facility-administered medications prior to visit.     Allergies  Allergen Reactions  . Penicillins Itching and Swelling    Has patient had a PCN reaction causing immediate rash, facial/tongue/throat swelling, SOB or lightheadedness  with hypotension: yes Has patient had a PCN reaction causing severe rash involving mucus membranes or skin necrosis: no Has patient had a PCN reaction that required hospitalization no Has patient  had a PCN reaction occurring within the last 10 years: yes If all of the above answers are "NO", then may proceed with Cephalosporin use.   [name] [Liraglutide]     Vomiting    Review of Systems  Constitutional: Negative for fever and malaise/fatigue.  HENT: Negative for congestion.   Eyes: Negative for blurred vision.  Respiratory: Negative for shortness of breath.   Cardiovascular: Negative for chest pain, palpitations and leg swelling.  Gastrointestinal: Negative for abdominal pain, blood in stool and nausea.  Genitourinary: Negative for dysuria and frequency.  Musculoskeletal: Positive for back pain. Negative for falls.  Skin: Negative for rash.  Neurological: Negative for dizziness, loss of consciousness and headaches.  Endo/Heme/Allergies: Negative for environmental allergies.  Psychiatric/Behavioral: Negative for depression. The patient is not nervous/anxious.        Objective:    Physical Exam Vitals signs and nursing note reviewed.  Constitutional:      General: She is not in acute distress.    Appearance: She is well-developed.  HENT:     Head: Normocephalic and atraumatic.     Nose: Nose normal.  Eyes:     General:        Right eye: No discharge.        Left eye: No discharge.  Neck:     Musculoskeletal: Normal range of motion and neck supple.  Cardiovascular:     Rate and Rhythm: Normal rate and regular rhythm.     Heart sounds: No murmur.  Pulmonary:     Effort: Pulmonary effort is normal.     Breath sounds: Normal breath sounds.  Abdominal:     General: Bowel sounds are normal.     Palpations: Abdomen is soft.     Tenderness: There is no abdominal tenderness.  Skin:    General: Skin is warm and dry.  Neurological:     Mental Status: She is alert and  oriented to person, place, and time.     BP 132/82 (BP Location: Left Arm, Patient Position: Sitting, Cuff Size: Normal)   Pulse 95   Temp 98.7 F (37.1 C) (Oral)   Resp 18   Wt (!) 320 lb 9.6 oz (145.4 kg)   SpO2 98%   BMI 46.00 kg/m  Wt Readings from Last 3 Encounters:  09/15/18 (!) 320 lb 9.6 oz (145.4 kg)  09/10/18 (!) 315 lb (142.9 kg)  08/18/18 (!) 312 lb (141.5 kg)     Lab Results  Component Value Date   WBC 5.9 03/13/2018   HGB 11.9 (L) 03/13/2018   HCT 35.9 (L) 03/13/2018   PLT 283.0 03/13/2018   GLUCOSE 86 07/14/2018   CHOL 140 03/13/2018   TRIG 66.0 03/13/2018   HDL 40.00 03/13/2018   LDLCALC 86 03/13/2018   ALT 8 07/14/2018   AST 8 07/14/2018   NA 139 07/14/2018   K 3.8 07/14/2018   CL 100 07/14/2018   CREATININE 0.74 07/14/2018   BUN 9 07/14/2018   CO2 25 07/14/2018   TSH 2.10 03/13/2018   HGBA1C 5.9 (H) 07/14/2018   MICROALBUR 0.50 12/15/2012    Lab Results  Component Value Date   TSH 2.10 03/13/2018   Lab Results  Component Value Date   WBC 5.9 03/13/2018   HGB 11.9 (L) 03/13/2018   HCT 35.9 (L) 03/13/2018   MCV 82.4 03/13/2018   PLT 283.0 03/13/2018   Lab Results  Component Value Date   NA 139 07/14/2018   K 3.8 07/14/2018  CO2 25 07/14/2018   GLUCOSE 86 07/14/2018   BUN 9 07/14/2018   CREATININE 0.74 07/14/2018   BILITOT 0.3 07/14/2018   ALKPHOS 83 07/14/2018   AST 8 07/14/2018   ALT 8 07/14/2018   PROT 7.2 07/14/2018   ALBUMIN 4.2 07/14/2018   CALCIUM 9.2 07/14/2018   GFR 136.18 03/13/2018   Lab Results  Component Value Date   CHOL 140 03/13/2018   Lab Results  Component Value Date   HDL 40.00 03/13/2018   Lab Results  Component Value Date   LDLCALC 86 03/13/2018   Lab Results  Component Value Date   TRIG 66.0 03/13/2018   Lab Results  Component Value Date   CHOLHDL 3 03/13/2018   Lab Results  Component Value Date   HGBA1C 5.9 (H) 07/14/2018       Assessment & Plan:   Problem List Items Addressed  This Visit    Essential hypertension    Well controlled, no changes to meds. Encouraged heart healthy diet such as the DASH diet and exercise as tolerated. Is having labile systolic BP 025K to 270W will separate her Losartan and hct and have her do the Losartan qhs and the hctz in am and she will monitor. Continue Amlodipine bid      Relevant Medications   losartan (COZAAR) 100 MG tablet   hydrochlorothiazide (HYDRODIURIL) 25 MG tablet   amLODipine (NORVASC) 10 MG tablet   Diabetes mellitus type 2 in obese (HCC)    hgba1c acceptable, minimize simple carbs. Increase exercise as tolerated. Continue current meds      Relevant Medications   losartan (COZAAR) 100 MG tablet   metFORMIN (GLUCOPHAGE) 500 MG tablet   Hypokalemia    Refilled potassium today      Relevant Medications   potassium chloride SA (K-DUR,KLOR-CON) 20 MEQ tablet   Vitamin D deficiency    Supplement and monitor      RESOLVED: Type 2 diabetes mellitus without complication, without long-term current use of insulin (HCC)   Relevant Medications   losartan (COZAAR) 100 MG tablet   metFORMIN (GLUCOPHAGE) 500 MG tablet   Acute right-sided low back pain with right-sided sciatica    Encouraged moist heat and gentle stretching as tolerated. May try NSAIDs and prescription meds as directed and report if symptoms worsen or seek immediate care. Struggling with activity due ot pain.       Fibroid    And endometrial thickening. Had a recent biopsy which was benign and they have her on progesterone with improved bleeding concerns. She is referred to GYN. Was previously with [facility] but Dr Leo Grosser retired.        Other Visit Diagnoses    Fibroids    -  Primary   Relevant Orders   Ambulatory referral to Obstetrics / Gynecology   Allergic state, initial encounter       Relevant Medications   potassium chloride SA (K-DUR,KLOR-CON) 20 MEQ tablet   Benign essential HTN       Relevant Medications   potassium  chloride SA (K-DUR,KLOR-CON) 20 MEQ tablet   losartan (COZAAR) 100 MG tablet   hydrochlorothiazide (HYDRODIURIL) 25 MG tablet   amLODipine (NORVASC) 10 MG tablet      I have discontinued Tailynn T. Loh's losartan-hydrochlorothiazide, lisinopril-hydrochlorothiazide, amLODipine, and amLODipine. I have also changed her potassium chloride SA and metFORMIN. Additionally, I am having her start on losartan, hydrochlorothiazide, and amLODipine. Lastly, I am having her maintain her fluticasone, fexofenadine, TURMERIC PO, Omega-3 Fatty Acids (  FISH OIL PO), ibuprofen, azelastine, aspirin EC, ONETOUCH DELICA LANCETS 87F, glucose blood, EPINEPHrine, B-12, Probiotic Product (PROBIOTIC-10 PO), acetaminophen, montelukast, pantoprazole, Vitamin D (Ergocalciferol), and medroxyPROGESTERone.  Meds ordered this encounter  Medications  . DISCONTD: amLODipine (NORVASC) 5 MG tablet    Sig: Take 1 tablet (5 mg total) by mouth 2 (two) times daily.    Dispense:  180 tablet    Refill:  1  . potassium chloride SA (K-DUR,KLOR-CON) 20 MEQ tablet    Sig: Take 1 tablet (20 mEq total) by mouth 3 (three) times daily.    Dispense:  270 tablet    Refill:  1  . losartan (COZAAR) 100 MG tablet    Sig: Take 1 tablet (100 mg total) by mouth at bedtime.    Dispense:  90 tablet    Refill:  1  . hydrochlorothiazide (HYDRODIURIL) 25 MG tablet    Sig: Take 1 tablet (25 mg total) by mouth daily.    Dispense:  90 tablet    Refill:  1  . metFORMIN (GLUCOPHAGE) 500 MG tablet    Sig: Take 1 tablet (500 mg total) by mouth daily with breakfast.    Dispense:  90 tablet    Refill:  1  . amLODipine (NORVASC) 10 MG tablet    Sig: Take 1 tablet (10 mg total) by mouth daily.    Dispense:  90 tablet    Refill:  3     Penni Homans, MD

## 2018-09-18 ENCOUNTER — Encounter (INDEPENDENT_AMBULATORY_CARE_PROVIDER_SITE_OTHER): Payer: Self-pay | Admitting: Family Medicine

## 2018-10-02 ENCOUNTER — Ambulatory Visit (INDEPENDENT_AMBULATORY_CARE_PROVIDER_SITE_OTHER): Payer: BC Managed Care – PPO | Admitting: Family Medicine

## 2018-10-02 ENCOUNTER — Encounter (INDEPENDENT_AMBULATORY_CARE_PROVIDER_SITE_OTHER): Payer: Self-pay | Admitting: Family Medicine

## 2018-10-02 VITALS — BP 149/65 | HR 102 | Temp 98.0°F | Ht 70.0 in | Wt 317.0 lb

## 2018-10-02 DIAGNOSIS — I10 Essential (primary) hypertension: Secondary | ICD-10-CM

## 2018-10-02 DIAGNOSIS — E559 Vitamin D deficiency, unspecified: Secondary | ICD-10-CM | POA: Diagnosis not present

## 2018-10-02 DIAGNOSIS — E66813 Obesity, class 3: Secondary | ICD-10-CM

## 2018-10-02 DIAGNOSIS — Z9189 Other specified personal risk factors, not elsewhere classified: Secondary | ICD-10-CM

## 2018-10-02 DIAGNOSIS — Z6841 Body Mass Index (BMI) 40.0 and over, adult: Secondary | ICD-10-CM

## 2018-10-02 MED ORDER — VITAMIN D (ERGOCALCIFEROL) 1.25 MG (50000 UNIT) PO CAPS: 50000.0000 [IU] | ORAL_CAPSULE | ORAL | 0 refills | Status: DC

## 2018-10-02 MED FILL — VIT D2 1.25 MG (50,000 UNIT: 1.25 MG | 28 days supply | Qty: 4 | Fill #0

## 2018-10-03 ENCOUNTER — Encounter: Payer: Self-pay | Admitting: Medical

## 2018-10-03 ENCOUNTER — Ambulatory Visit: Payer: BC Managed Care – PPO | Admitting: Medical

## 2018-10-03 VITALS — BP 132/74 | HR 88 | Temp 98.8°F | Resp 16 | Ht 70.0 in | Wt 323.2 lb

## 2018-10-03 DIAGNOSIS — J0100 Acute maxillary sinusitis, unspecified: Secondary | ICD-10-CM

## 2018-10-03 MED ORDER — BENZONATATE 100 MG PO CAPS: 100.0000 mg | ORAL_CAPSULE | Freq: Three times a day (TID) | ORAL | 0 refills | Status: DC | PRN

## 2018-10-03 MED ORDER — DOXYCYCLINE HYCLATE 100 MG PO TABS: 100.0000 mg | ORAL_TABLET | Freq: Two times a day (BID) | ORAL | 0 refills | Status: DC

## 2018-10-03 MED FILL — DOXYCYCLINE HYCLATE 100 MG: 100 | 7 days supply | Qty: 14 | Fill #0

## 2018-10-03 MED FILL — BENZONATATE 100 MG CAP: 100 | 10 days supply | Qty: 30 | Fill #0

## 2018-10-03 NOTE — Patient Instructions (Signed)
Your appear to have a sinus infection. I am prescribing doxycycline antibiotic for the infection. To help with the nasal congestion use your steroid nasal spray. For your associated cough, I prescribed cough medicine benzonatate.  Rest, hydrate, tylenol for fever.  Follow up in 7 days or as needed.

## 2018-10-03 NOTE — Progress Notes (Signed)
Subjective:    Patient ID: Brandi Lester, female    DOB: 1966-04-08, 53 y.o.   MRN: [id number]  HPI  Pt in with more than 10 days.   She states at first nasal congestion and now getting maxillary and sinus pressure. Pt has flonase and astelin. Pt states now feels like getting sinus infection. She is blowing out some mucus from her nose. Some left side ear pressure. States pain over maxillary sinus pressure and some pain in pallet.  She has some productive cough. No fever, no chills or sweats.  No bodyaches or myalgias.   Review of Systems  Constitutional: Negative for chills, fatigue and fever.  HENT: Positive for congestion, sinus pressure and sinus pain. Negative for postnasal drip.   Respiratory: Positive for cough. Negative for chest tightness, shortness of breath and wheezing.   Cardiovascular: Negative for chest pain and palpitations.  Gastrointestinal: Negative for abdominal pain.  Musculoskeletal: Negative for back pain.  Skin: Negative for rash.  Neurological: Negative for dizziness and light-headedness.  Hematological: Negative for adenopathy. Does not bruise/bleed easily.  Psychiatric/Behavioral: Negative for behavioral problems, confusion, sleep disturbance and suicidal ideas. The patient is not nervous/anxious.     Past Medical History:  Diagnosis Date  . ADD (attention deficit disorder)   . Allergy    allergic rhinitis  . Anemia 07/25/2013  . Angio-edema   . Back pain   . Costochondritis 02/20/2015  . Diabetes mellitus    Type II   previously 3 years ago - no longer on meds   . Dyslipidemia 07/25/2013  . Fungus infection 10/13   Left great toe  . GERD (gastroesophageal reflux disease)   . Hypertension   . IBS (irritable bowel syndrome)   . Ingrown nail 10/13   right foot next to the last toe  . Insomnia 04/20/2013  . Joint pain   . Lactose intolerance   . Leg edema   . Obesity   . Pedal edema 02/20/2015  . Prediabetes   . Preventative health care  09/25/2015  . PVC (premature ventricular contraction)      Social History   Socioeconomic History  . Marital status: Single    Spouse name: Not on file  . Number of children: Not on file  . Years of education: Not on file  . Highest education level: Not on file  Occupational History  . Occupation: Pharmacist, hospital, [employer] manager  Social Needs  . Financial resource strain: Not on file  . Food insecurity:    Worry: Not on file    Inability: Not on file  . Transportation needs:    Medical: Not on file    Non-medical: Not on file  Tobacco Use  . Smoking status: Former Smoker    Packs/day: 1.00    Years: 15.00    Pack years: 15.00    Last attempt to quit: 09/03/2001    Years since quitting: 17.0  . Smokeless tobacco: Never Used  Substance and Sexual Activity  . Alcohol use: Yes    Comment: rare  . Drug use: No  . Sexual activity: Not on file  Lifestyle  . Physical activity:    Days per week: Not on file    Minutes per session: Not on file  . Stress: Not on file  Relationships  . Social connections:    Talks on phone: Not on file    Gets together: Not on file    Attends religious service: Not on file    Active  member of club or organization: Not on file    Attends meetings of clubs or organizations: Not on file    Relationship status: Not on file  . Intimate partner violence:    Fear of current or ex partner: Not on file    Emotionally abused: Not on file    Physically abused: Not on file    Forced sexual activity: Not on file  Other Topics Concern  . Not on file  Social History Narrative  . Not on file    Past Surgical History:  Procedure Laterality Date  . HYSTEROSCOPY N/A 10/04/2015   Procedure: HYSTEROSCOPY with Removal IUD;  Surgeon: Vanessa Kick, MD;  Location: [facility];  Service: Gynecology;  Laterality: N/A;  . lasik     eye surgery  . WISDOM TOOTH EXTRACTION      Family History  Problem Relation Age of Onset  . Heart attack Mother 62  . Hypertension Mother     . Heart disease Mother   . Obesity Mother   . Cancer Father        prostate  . Heart attack Sister 47  . Hypertension Other     Allergies  Allergen Reactions  . Penicillins Itching and Swelling    Has patient had a PCN reaction causing immediate rash, facial/tongue/throat swelling, SOB or lightheadedness with hypotension: yes Has patient had a PCN reaction causing severe rash involving mucus membranes or skin necrosis: no Has patient had a PCN reaction that required hospitalization no Has patient had a PCN reaction occurring within the last 10 years: yes If all of the above answers are "NO", then may proceed with Cephalosporin use.   [name] [Liraglutide]     Vomiting    Current Outpatient Medications on File Prior to Visit  Medication Sig Dispense Refill  . acetaminophen (TYLENOL 8 HOUR) 650 MG CR tablet Take 1 tablet (650 mg total) every 8 (eight) hours as needed by mouth for pain.    [name] amLODipine (NORVASC) 10 MG tablet Take 1 tablet (10 mg total) by mouth daily. 90 tablet 3  . aspirin EC 81 MG tablet Take 1 tablet (81 mg total) by mouth daily.    [name] azelastine (ASTELIN) 0.1 % nasal spray Place 2 sprays into both nostrils 2 (two) times daily. Use in each nostril as directed 90 mL 1  . Cyanocobalamin (B-12) 1000 MCG CAPS Take by mouth daily.    [name] EPINEPHrine (EPIPEN 2-PAK) 0.3 mg/0.3 mL IJ SOAJ injection Use as directed for severe allergic reaction 2 Device 1  . fexofenadine (ALLEGRA ALLERGY) 180 MG tablet Take 1 tablet (180 mg total) by mouth daily. 301 tablet 1  . fluticasone (FLONASE) 50 MCG/ACT nasal spray Place 2 sprays into both nostrils daily. (Patient taking differently: Place 2 sprays into both nostrils daily as needed for allergies. ) 16 g 11  . glucose blood test strip OneTouch Ultra Test strips    . hydrochlorothiazide (HYDRODIURIL) 25 MG tablet Take 1 tablet (25 mg total) by mouth daily. 90 tablet 1  . ibuprofen (ADVIL,MOTRIN) 600 MG tablet Take 1 tablet (600 mg  total) by mouth every 6 (six) hours as needed. 90 tablet 0  . losartan (COZAAR) 100 MG tablet Take 1 tablet (100 mg total) by mouth at bedtime. 90 tablet 1  . medroxyPROGESTERone (PROVERA) 10 MG tablet   6  . metFORMIN (GLUCOPHAGE) 500 MG tablet Take 1 tablet (500 mg total) by mouth daily with breakfast. 90 tablet 1  . montelukast (  SINGULAIR) 10 MG tablet Take 1 tablet (10 mg total) by mouth at bedtime as needed. 90 tablet 1  . Omega-3 Fatty Acids (FISH OIL PO) Take 1 capsule by mouth daily.    [name] DELICA LANCETS 63A MISC OneTouch Delica Lancets 33 gauge    . pantoprazole (PROTONIX) 40 MG tablet TAKE 1 TABLET (40 MG TOTAL) BY MOUTH DAILY. 90 tablet 1  . potassium chloride SA (K-DUR,KLOR-CON) 20 MEQ tablet Take 1 tablet (20 mEq total) by mouth 3 (three) times daily. 270 tablet 1  . Probiotic Product (PROBIOTIC-10 PO) Take by mouth daily.    . TURMERIC PO Take 1 capsule by mouth daily.    . Vitamin D, Ergocalciferol, (DRISDOL) 1.25 MG (50000 UT) CAPS capsule Take 1 capsule (50,000 Units total) by mouth every 7 (seven) days. 4 capsule 0   No current facility-administered medications on file prior to visit.     BP 132/74   Pulse 88   Temp 98.8 F (37.1 C) (Oral)   Resp 16   Ht 5\' 10"  (1.778 m)   Wt (!) 323 lb 3.2 oz (146.6 kg)   SpO2 100%   BMI 46.37 kg/m       Objective:   Physical Exam  General  Mental Status - Alert. General Appearance - Well groomed. Not in acute distress.  Skin Rashes- No Rashes.  HEENT Head- Normal. Ear Auditory Canal - Left- Normal. Right - Normal.Tympanic Membrane- Left- Normal. Right- Normal. Eye Sclera/Conjunctiva- Left- Normal. Right- Normal. Nose & Sinuses Nasal Mucosa- Left-  Boggy and Congested. Right-  Boggy and  Congested.Bilateral maxillary and frontal sinus pressure. Mouth & Throat Lips: Upper Lip- Normal: no dryness, cracking, pallor, cyanosis, or vesicular eruption. Lower Lip-Normal: no dryness, cracking, pallor, cyanosis or  vesicular eruption. Buccal Mucosa- Bilateral- No Aphthous ulcers. Oropharynx- No Discharge or Erythema. Tonsils: Characteristics- Bilateral- No Erythema or Congestion. Size/Enlargement- Bilateral- No enlargement. Discharge- bilateral-None.  Neck Neck- Supple. No Masses.   Chest and Lung Exam Auscultation: Breath Sounds:-Clear even and unlabored.  Cardiovascular Auscultation:Rythm- Regular, rate and rhythm. Murmurs & Other Heart Sounds:Ausculatation of the heart reveal- No Murmurs.  Lymphatic Head & Neck General Head & Neck Lymphatics: Bilateral: Description- No Localized lymphadenopathy.       Assessment & Plan:  Your appear to have a sinus infection. I am prescribing doxycycline antibiotic for the infection. To help with the nasal congestion use your steroid nasal spray. For your associated cough, I prescribed cough medicine benzonatate.  Rest, hydrate, tylenol for fever.  Follow up in 7 days or as needed.  Mackie Pai, PA-C

## 2018-10-05 NOTE — Progress Notes (Signed)
Office: [phone]  /  Fax: [phone]   HPI:   Chief Complaint: OBESITY Brandi Lester is here to discuss her progress with her obesity treatment plan. She is on the keep a food journal with 1400-1500 calories and 95 grams of protein daily and is following her eating plan approximately 75 % of the time. She states she is walking for 30 minutes 3-4 times per week. Brandi Lester states breakfast and lunch is basically the same thing everyday. Dinner tends to be the difficult meal, and she goes over calories.  Her weight is (!) 317 lb (143.8 kg) today and has gained 2 pounds since her last visit. She has lost 8 lbs since starting treatment with [name].  Vitamin D Deficiency Brandi Lester has a diagnosis of vitamin D deficiency. She is currently taking prescription Vit D. She notes fatigue and denies nausea, vomiting or muscle weakness.  At risk for osteopenia and osteoporosis Brandi Lester is at higher risk of osteopenia and osteoporosis due to vitamin D deficiency.   Hypertension Brandi Lester is a 53 y.o. female with hypertension. Brandi Lester's blood pressure is slightly elevated today. She denies chest pain, chest pressure, or headaches. She states she was late for her appointment, and she reports change in medication recently. She is working on weight loss to help control her blood pressure with the goal of decreasing her risk of heart attack and stroke. Brandi Lester's blood pressure is not currently controlled.  ASSESSMENT AND PLAN:  Vitamin D deficiency - Plan: Vitamin D, Ergocalciferol, (DRISDOL) 1.25 MG (50000 UT) CAPS capsule  Essential hypertension  At risk for osteoporosis  Class 3 severe obesity with serious comorbidity and body mass index (BMI) of 45.0 to 49.9 in adult, unspecified obesity type ([place])  PLAN:  Vitamin D Deficiency Brandi Lester was informed that low vitamin D levels contributes to fatigue and are associated with obesity, breast, and colon cancer. Brandi Lester agrees to continue taking prescription  Vit D @50 ,000 IU every week #4 and we will refill for 1 month. She will follow up for routine testing of vitamin D, at least 2-3 times per year. She was informed of the risk of over-replacement of vitamin D and agrees to not increase her dose unless she discusses this with [name] first. Brandi Lester agrees to follow up with our clinic in 2 weeks.  At risk for osteopenia and osteoporosis Brandi Lester was given extended (15 minutes) osteoporosis prevention counseling today. Brandi Lester is at risk for osteopenia and osteoporsis due to her vitamin D deficiency. She was encouraged to take her vitamin D and follow her higher calcium diet and increase strengthening exercise to help strengthen her bones and decrease her risk of osteopenia and osteoporosis.  Hypertension We discussed sodium restriction, working on healthy weight loss, and a regular exercise program as the means to achieve improved blood pressure control. Brandi Lester agreed with this plan and agreed to follow up as directed. We will continue to monitor her blood pressure as well as her progress with the above lifestyle modifications. Brandi Lester will continue her medications and will watch for signs of hypotension as she continues her lifestyle modifications. We will follow up on blood pressure at next appointment. Brandi Lester agrees to follow up with our clinic in 2 weeks.  Obesity Brandi Lester is currently in the action stage of change. As such, her goal is to continue with weight loss efforts She has agreed to keep a food journal with 1400-1500 calories and 95 grams of protein daily Brandi Lester has been instructed to work up to a goal  of 150 minutes of combined cardio and strengthening exercise per week or resistance training for 15-30 minutes 2-3 times per week, in addition to walking 5 days per week and journaling 100%. for weight loss and overall health benefits. We discussed the following Behavioral Modification Strategies today: increasing lean protein intake, increasing  vegetables, work on meal planning and easy cooking plans, planning for success, and keep a strict food journal   Brandi Lester has agreed to follow up with our clinic in 2 weeks. She was informed of the importance of frequent follow up visits to maximize her success with intensive lifestyle modifications for her multiple health conditions.  ALLERGIES: Allergies  Allergen Reactions  . Penicillins Itching and Swelling    Has patient had a PCN reaction causing immediate rash, facial/tongue/throat swelling, SOB or lightheadedness with hypotension: yes Has patient had a PCN reaction causing severe rash involving mucus membranes or skin necrosis: no Has patient had a PCN reaction that required hospitalization no Has patient had a PCN reaction occurring within the last 10 years: yes If all of the above answers are "NO", then may proceed with Cephalosporin use.   Brandi Lester [Liraglutide]     Vomiting    MEDICATIONS: Current Outpatient Medications on File Prior to Visit  Medication Sig Dispense Refill  . acetaminophen (TYLENOL 8 HOUR) 650 MG CR tablet Take 1 tablet (650 mg total) every 8 (eight) hours as needed by mouth for pain.    [name] amLODipine (NORVASC) 10 MG tablet Take 1 tablet (10 mg total) by mouth daily. 90 tablet 3  . aspirin EC 81 MG tablet Take 1 tablet (81 mg total) by mouth daily.    [name] azelastine (ASTELIN) 0.1 % nasal spray Place 2 sprays into both nostrils 2 (two) times daily. Use in each nostril as directed 90 mL 1  . Cyanocobalamin (B-12) 1000 MCG CAPS Take by mouth daily.    [name] EPINEPHrine (EPIPEN 2-PAK) 0.3 mg/0.3 mL IJ SOAJ injection Use as directed for severe allergic reaction 2 Device 1  . fexofenadine (ALLEGRA ALLERGY) 180 MG tablet Take 1 tablet (180 mg total) by mouth daily. 301 tablet 1  . fluticasone (FLONASE) 50 MCG/ACT nasal spray Place 2 sprays into both nostrils daily. (Patient taking differently: Place 2 sprays into both nostrils daily as needed for allergies. ) 16 g 11  .  glucose blood test strip OneTouch Ultra Test strips    . hydrochlorothiazide (HYDRODIURIL) 25 MG tablet Take 1 tablet (25 mg total) by mouth daily. 90 tablet 1  . ibuprofen (ADVIL,MOTRIN) 600 MG tablet Take 1 tablet (600 mg total) by mouth every 6 (six) hours as needed. 90 tablet 0  . losartan (COZAAR) 100 MG tablet Take 1 tablet (100 mg total) by mouth at bedtime. 90 tablet 1  . medroxyPROGESTERone (PROVERA) 10 MG tablet   6  . metFORMIN (GLUCOPHAGE) 500 MG tablet Take 1 tablet (500 mg total) by mouth daily with breakfast. 90 tablet 1  . montelukast (SINGULAIR) 10 MG tablet Take 1 tablet (10 mg total) by mouth at bedtime as needed. 90 tablet 1  . Omega-3 Fatty Acids (FISH OIL PO) Take 1 capsule by mouth daily.    [name] DELICA LANCETS 38V MISC OneTouch Delica Lancets 33 gauge    . pantoprazole (PROTONIX) 40 MG tablet TAKE 1 TABLET (40 MG TOTAL) BY MOUTH DAILY. 90 tablet 1  . potassium chloride SA (K-DUR,KLOR-CON) 20 MEQ tablet Take 1 tablet (20 mEq total) by mouth 3 (three) times daily. [place]  tablet 1  . Probiotic Product (PROBIOTIC-10 PO) Take by mouth daily.    . TURMERIC PO Take 1 capsule by mouth daily.     No current facility-administered medications on file prior to visit.     PAST MEDICAL HISTORY: Past Medical History:  Diagnosis Date  . ADD (attention deficit disorder)   . Allergy    allergic rhinitis  . Anemia 07/25/2013  . Angio-edema   . Back pain   . Costochondritis 02/20/2015  . Diabetes mellitus    Type II   previously 3 years ago - no longer on meds   . Dyslipidemia 07/25/2013  . Fungus infection 10/13   Left great toe  . GERD (gastroesophageal reflux disease)   . Hypertension   . IBS (irritable bowel syndrome)   . Ingrown nail 10/13   right foot next to the last toe  . Insomnia 04/20/2013  . Joint pain   . Lactose intolerance   . Leg edema   . Obesity   . Pedal edema 02/20/2015  . Prediabetes   . Preventative health care 09/25/2015  . PVC (premature  ventricular contraction)     PAST SURGICAL HISTORY: Past Surgical History:  Procedure Laterality Date  . HYSTEROSCOPY N/A 10/04/2015   Procedure: HYSTEROSCOPY with Removal IUD;  Surgeon: Vanessa Kick, MD;  Location: [facility];  Service: Gynecology;  Laterality: N/A;  . lasik     eye surgery  . WISDOM TOOTH EXTRACTION      SOCIAL HISTORY: Social History   Tobacco Use  . Smoking status: Former Smoker    Packs/day: 1.00    Years: 15.00    Pack years: 15.00    Last attempt to quit: 09/03/2001    Years since quitting: 17.0  . Smokeless tobacco: Never Used  Substance Use Topics  . Alcohol use: Yes    Comment: rare  . Drug use: No    FAMILY HISTORY: Family History  Problem Relation Age of Onset  . Heart attack Mother 104  . Hypertension Mother   . Heart disease Mother   . Obesity Mother   . Cancer Father        prostate  . Heart attack Sister 88  . Hypertension Other     ROS: Review of Systems  Constitutional: Positive for malaise/fatigue. Negative for weight loss.  Cardiovascular: Negative for chest pain.       Negative chest pressure  Gastrointestinal: Negative for nausea and vomiting.  Musculoskeletal:       Negative muscle weakness  Neurological: Negative for headaches.    PHYSICAL EXAM: Blood pressure (!) 149/65, pulse (!) 102, temperature 98 F (36.7 C), temperature source Oral, height 5\' 10"  (1.778 m), weight (!) 317 lb (143.8 kg), SpO2 99 %. Body mass index is 45.48 kg/m. Physical Exam Vitals signs reviewed.  Constitutional:      Appearance: Normal appearance. She is obese.  Cardiovascular:     Rate and Rhythm: Normal rate.     Pulses: Normal pulses.  Pulmonary:     Effort: Pulmonary effort is normal.     Breath sounds: Normal breath sounds.  Musculoskeletal: Normal range of motion.  Skin:    General: Skin is warm and dry.  Neurological:     Mental Status: She is alert and oriented to person, place, and time.  Psychiatric:        Mood and Affect:  Mood normal.        Behavior: Behavior normal.     RECENT LABS AND TESTS:  BMET    Component Value Date/Time   NA 139 07/14/2018 1109   K 3.8 07/14/2018 1109   CL 100 07/14/2018 1109   CO2 25 07/14/2018 1109   GLUCOSE 86 07/14/2018 1109   GLUCOSE 86 03/13/2018 1152   BUN 9 07/14/2018 1109   CREATININE 0.74 07/14/2018 1109   CREATININE 0.72 02/03/2014 1648   CALCIUM 9.2 07/14/2018 1109   GFRNONAA 95 07/14/2018 1109   GFRAA 109 07/14/2018 1109   Lab Results  Component Value Date   HGBA1C 5.9 (H) 07/14/2018   HGBA1C 5.9 03/13/2018   HGBA1C 5.8 (H) 09/12/2017   HGBA1C 6.2 (H) 05/23/2017   HGBA1C 6.6 (H) 03/15/2017   Lab Results  Component Value Date   INSULIN 15.4 07/14/2018   INSULIN 12.9 09/12/2017   INSULIN 31.1 (H) 05/23/2017   CBC    Component Value Date/Time   WBC 5.9 03/13/2018 1152   RBC 4.35 03/13/2018 1152   HGB 11.9 (L) 03/13/2018 1152   HGB 12.1 05/23/2017 1025   HCT 35.9 (L) 03/13/2018 1152   HCT 36.6 05/23/2017 1025   PLT 283.0 03/13/2018 1152   MCV 82.4 03/13/2018 1152   MCV 82 05/23/2017 1025   MCH 27.1 05/23/2017 1025   MCH 28.2 02/03/2014 1648   MCHC 33.2 03/13/2018 1152   RDW 15.0 03/13/2018 1152   RDW 14.9 05/23/2017 1025   LYMPHSABS 1.7 05/23/2017 1025   MONOABS 0.4 06/09/2014 0843   EOSABS 0.1 05/23/2017 1025   BASOSABS 0.0 05/23/2017 1025   Iron/TIBC/Ferritin/ %Sat No results found for: IRON, TIBC, FERRITIN, IRONPCTSAT Lipid Panel     Component Value Date/Time   CHOL 140 03/13/2018 1152   CHOL 132 09/12/2017 0927   TRIG 66.0 03/13/2018 1152   HDL 40.00 03/13/2018 1152   HDL 32 (L) 09/12/2017 0927   CHOLHDL 3 03/13/2018 1152   VLDL 13.2 03/13/2018 1152   LDLCALC 86 03/13/2018 1152   LDLCALC 81 09/12/2017 0927   Hepatic Function Panel     Component Value Date/Time   PROT 7.2 07/14/2018 1109   ALBUMIN 4.2 07/14/2018 1109   AST 8 07/14/2018 1109   ALT 8 07/14/2018 1109   ALKPHOS 83 07/14/2018 1109   BILITOT 0.3 07/14/2018  1109   BILIDIR 0.0 06/09/2014 0843   IBILI 0.1 (L) 02/03/2014 1648      Component Value Date/Time   TSH 2.10 03/13/2018 1152   TSH 1.710 05/23/2017 1025   TSH 1.26 03/15/2017 1140      OBESITY BEHAVIORAL INTERVENTION VISIT  Today's visit was # 29   Starting weight: 325 lbs Starting date: 05/23/17 Today's weight : 317 lbs  Today's date: 10/02/2018 Total lbs lost to date: 8    ASK: We discussed the diagnosis of obesity with Brandi Lester today and Charnell agreed to give [name] permission to discuss obesity behavioral modification therapy today.  ASSESS: Brandi Lester has the diagnosis of obesity and her BMI today is 45.48 Brandi Lester is in the action stage of change   ADVISE: Brandi Lester was educated on the multiple health risks of obesity as well as the benefit of weight loss to improve her health. She was advised of the need for long term treatment and the importance of lifestyle modifications to improve her current health and to decrease her risk of future health problems.  AGREE: Multiple dietary modification options and treatment options were discussed and  Joelie agreed to follow the recommendations documented in the above note.  ARRANGE: Jerine was educated on the importance  of frequent visits to treat obesity as outlined per CMS and USPSTF guidelines and agreed to schedule her next follow up appointment today.  I, Trixie Dredge, am acting as transcriptionist for Ilene Qua, MD  I have reviewed the above documentation for accuracy and completeness, and I agree with the above. - Ilene Qua, MD

## 2018-10-16 MED FILL — MEDROXYPROGESTERONE 10 MG T: 10 | 90 days supply | Qty: 90 | Fill #1

## 2018-10-23 ENCOUNTER — Encounter (INDEPENDENT_AMBULATORY_CARE_PROVIDER_SITE_OTHER): Payer: Self-pay

## 2018-10-23 ENCOUNTER — Ambulatory Visit (INDEPENDENT_AMBULATORY_CARE_PROVIDER_SITE_OTHER): Payer: BC Managed Care – PPO | Admitting: Family Medicine

## 2018-10-29 ENCOUNTER — Encounter (INDEPENDENT_AMBULATORY_CARE_PROVIDER_SITE_OTHER): Payer: Self-pay | Admitting: Family Medicine

## 2018-10-29 ENCOUNTER — Ambulatory Visit (INDEPENDENT_AMBULATORY_CARE_PROVIDER_SITE_OTHER): Payer: BC Managed Care – PPO | Admitting: Family Medicine

## 2018-10-29 VITALS — BP 122/75 | HR 94 | Ht 69.0 in | Wt 315.0 lb

## 2018-10-29 DIAGNOSIS — E559 Vitamin D deficiency, unspecified: Secondary | ICD-10-CM

## 2018-10-29 DIAGNOSIS — Z6841 Body Mass Index (BMI) 40.0 and over, adult: Secondary | ICD-10-CM

## 2018-10-29 DIAGNOSIS — F3289 Other specified depressive episodes: Secondary | ICD-10-CM

## 2018-10-29 DIAGNOSIS — Z9189 Other specified personal risk factors, not elsewhere classified: Secondary | ICD-10-CM

## 2018-10-29 MED ORDER — BUPROPION HCL ER (SR) 150 MG PO TB12: 150.0000 mg | ORAL_TABLET | Freq: Every day | ORAL | 0 refills | Status: DC

## 2018-10-29 MED ORDER — VITAMIN D (ERGOCALCIFEROL) 1.25 MG (50000 UNIT) PO CAPS: 50000.0000 [IU] | ORAL_CAPSULE | ORAL | 0 refills | Status: DC

## 2018-10-29 MED FILL — BUPROPION HCL ER (SR) 150 M: 150 | 30 days supply | Qty: 30 | Fill #0

## 2018-10-29 MED FILL — VIT D2 1.25 MG (50,000 UNIT: 1.25 MG | 28 days supply | Qty: 4 | Fill #0

## 2018-10-29 NOTE — Progress Notes (Signed)
Office: [phone]  /  Fax: [phone]   HPI:   Chief Complaint: OBESITY Brandi Lester is here to discuss her progress with her obesity treatment plan. She is on the keep a food journal with 1400-1500 calories and 95 grams of protein daily and is following her eating plan approximately 80 % of the time. She states she is walking and lifting weights for 45-60 minutes 4 times per week. Brandi Lester is doing better with weight loss again. She struggles with cravings and emotional eating frequently especially when stress is elevated. She is eating out more and drinking liquids with calories. She states she is ready to get back on track.  Her weight is (!) 315 lb (142.9 kg) today and has had a weight loss of 2 pounds over a period of 4 weeks since her last visit. She has lost 10 lbs since starting treatment with [name].  Vitamin D Deficiency Brandi Lester has a diagnosis of vitamin D deficiency. She is stable on prescription Vit D and denies nausea, vomiting or muscle weakness.  At risk for osteopenia and osteoporosis Brandi Lester is at higher risk of osteopenia and osteoporosis due to vitamin D deficiency.   Depression with emotional eating behaviors Brandi Lester notes increased stress and comfort eating in the last few months. She feels her snacking and cravings are out of control. Brandi Lester struggles with emotional eating and using food for comfort to the extent that it is negatively impacting her health. She often snacks when she is not hungry. Brandi Lester sometimes feels guilty that she made poor food choices. She has been working on behavior modification techniques to help reduce her emotional eating and has been somewhat successful. She shows no sign of suicidal or homicidal ideations.  Depression screen [facility] 2/9 05/23/2017 07/03/2016 04/13/2016  Decreased Interest 2 0 0  Down, Depressed, Hopeless 1 0 0  PHQ - 2 Score 3 0 0  Altered sleeping 1 - -  Tired, decreased energy 1 - -  Change in appetite 1 - -  Feeling bad or  failure about yourself  0 - -  Trouble concentrating 1 - -  Moving slowly or fidgety/restless 0 - -  Suicidal thoughts 0 - -  PHQ-9 Score 7 - -  Difficult doing work/chores Not difficult at all - -    ASSESSMENT AND PLAN:  Vitamin D deficiency - Plan: Vitamin D, Ergocalciferol, (DRISDOL) 1.25 MG (50000 UT) CAPS capsule  Other depression - with emotional eating - Plan: buPROPion (WELLBUTRIN SR) 150 MG 12 hr tablet  At risk for osteoporosis  Class 3 severe obesity with serious comorbidity and body mass index (BMI) of 45.0 to 49.9 in adult, unspecified obesity type (HCC)  PLAN:  Vitamin D Deficiency Brandi Lester was informed that low vitamin D levels contributes to fatigue and are associated with obesity, breast, and colon cancer. Brandi Lester agrees to continue taking prescription Vit D @50 ,000 IU every week #4 and we will refill for 1 month. She will follow up for routine testing of vitamin D, at least 2-3 times per year. She was informed of the risk of over-replacement of vitamin D and agrees to not increase her dose unless she discusses this with [name] first. Brandi Lester agrees to follow up with our clinic in 3 to 4 weeks.  At risk for osteopenia and osteoporosis Brandi Lester was given extended (15 minutes) osteoporosis prevention counseling today. Brandi Lester is at risk for osteopenia and osteoporsis due to her vitamin D deficiency. She was encouraged to take her vitamin D and follow her  higher calcium diet and increase strengthening exercise to help strengthen her bones and decrease her risk of osteopenia and osteoporosis.  Depression with Emotional Eating Behaviors We discussed behavior modification techniques today to help Brandi Lester deal with her emotional eating and depression. Brandi Lester agrees to start Wellbutrin SR 150 mg q AM #30 with no refills. Brandi Lester agrees to follow up with our clinic in 3 to 4 weeks.  Obesity Brandi Lester is currently in the action stage of change. As such, her goal is to continue with  weight loss efforts She has agreed to follow the Category 3 plan Brandi Lester has been instructed to work up to a goal of 150 minutes of combined cardio and strengthening exercise per week for weight loss and overall health benefits. We discussed the following Behavioral Modification Strategies today: increasing lean protein intake, decreasing simple carbohydrates, decrease eating out, work on meal planning and easy cooking plans, and decrease liquid calories   Brandi Lester has agreed to follow up with our clinic in 3 to 4 weeks. She was informed of the importance of frequent follow up visits to maximize her success with intensive lifestyle modifications for her multiple health conditions.  ALLERGIES: Allergies  Allergen Reactions  . Penicillins Itching and Swelling    Has patient had a PCN reaction causing immediate rash, facial/tongue/throat swelling, SOB or lightheadedness with hypotension: yes Has patient had a PCN reaction causing severe rash involving mucus membranes or skin necrosis: no Has patient had a PCN reaction that required hospitalization no Has patient had a PCN reaction occurring within the last 10 years: yes If all of the above answers are "NO", then may proceed with Cephalosporin use.   Brandi Lester [Liraglutide]     Vomiting    MEDICATIONS: Current Outpatient Medications on File Prior to Visit  Medication Sig Dispense Refill  . acetaminophen (TYLENOL 8 HOUR) 650 MG CR tablet Take 1 tablet (650 mg total) every 8 (eight) hours as needed by mouth for pain.    [name] amLODipine (NORVASC) 10 MG tablet Take 1 tablet (10 mg total) by mouth daily. 90 tablet 3  . aspirin EC 81 MG tablet Take 1 tablet (81 mg total) by mouth daily.    [name] azelastine (ASTELIN) 0.1 % nasal spray Place 2 sprays into both nostrils 2 (two) times daily. Use in each nostril as directed 90 mL 1  . benzonatate (TESSALON) 100 MG capsule Take 1 capsule (100 mg total) by mouth 3 (three) times daily as needed for cough. 30  capsule 0  . Cyanocobalamin (B-12) 1000 MCG CAPS Take by mouth daily.    [name] doxycycline (VIBRA-TABS) 100 MG tablet Take 1 tablet (100 mg total) by mouth 2 (two) times daily. Can give caps or generic. 14 tablet 0  . EPINEPHrine (EPIPEN 2-PAK) 0.3 mg/0.3 mL IJ SOAJ injection Use as directed for severe allergic reaction 2 Device 1  . fexofenadine (ALLEGRA ALLERGY) 180 MG tablet Take 1 tablet (180 mg total) by mouth daily. 301 tablet 1  . fluticasone (FLONASE) 50 MCG/ACT nasal spray Place 2 sprays into both nostrils daily. (Patient taking differently: Place 2 sprays into both nostrils daily as needed for allergies. ) 16 g 11  . glucose blood test strip OneTouch Ultra Test strips    . hydrochlorothiazide (HYDRODIURIL) 25 MG tablet Take 1 tablet (25 mg total) by mouth daily. 90 tablet 1  . ibuprofen (ADVIL,MOTRIN) 600 MG tablet Take 1 tablet (600 mg total) by mouth every 6 (six) hours as needed. 90 tablet 0  .  losartan (COZAAR) 100 MG tablet Take 1 tablet (100 mg total) by mouth at bedtime. 90 tablet 1  . medroxyPROGESTERone (PROVERA) 10 MG tablet   6  . metFORMIN (GLUCOPHAGE) 500 MG tablet Take 1 tablet (500 mg total) by mouth daily with breakfast. 90 tablet 1  . montelukast (SINGULAIR) 10 MG tablet Take 1 tablet (10 mg total) by mouth at bedtime as needed. 90 tablet 1  . Omega-3 Fatty Acids (FISH OIL PO) Take 1 capsule by mouth daily.    [name] DELICA LANCETS 16X MISC OneTouch Delica Lancets 33 gauge    . pantoprazole (PROTONIX) 40 MG tablet TAKE 1 TABLET (40 MG TOTAL) BY MOUTH DAILY. 90 tablet 1  . potassium chloride SA (K-DUR,KLOR-CON) 20 MEQ tablet Take 1 tablet (20 mEq total) by mouth 3 (three) times daily. 270 tablet 1  . Probiotic Product (PROBIOTIC-10 PO) Take by mouth daily.    . TURMERIC PO Take 1 capsule by mouth daily.     No current facility-administered medications on file prior to visit.     PAST MEDICAL HISTORY: Past Medical History:  Diagnosis Date  . ADD (attention deficit  disorder)   . Allergy    allergic rhinitis  . Anemia 07/25/2013  . Angio-edema   . Back pain   . Costochondritis 02/20/2015  . Diabetes mellitus    Type II   previously 3 years ago - no longer on meds   . Dyslipidemia 07/25/2013  . Fungus infection 10/13   Left great toe  . GERD (gastroesophageal reflux disease)   . Hypertension   . IBS (irritable bowel syndrome)   . Ingrown nail 10/13   right foot next to the last toe  . Insomnia 04/20/2013  . Joint pain   . Lactose intolerance   . Leg edema   . Obesity   . Pedal edema 02/20/2015  . Prediabetes   . Preventative health care 09/25/2015  . PVC (premature ventricular contraction)     PAST SURGICAL HISTORY: Past Surgical History:  Procedure Laterality Date  . HYSTEROSCOPY N/A 10/04/2015   Procedure: HYSTEROSCOPY with Removal IUD;  Surgeon: Vanessa Kick, MD;  Location: [facility];  Service: Gynecology;  Laterality: N/A;  . lasik     eye surgery  . WISDOM TOOTH EXTRACTION      SOCIAL HISTORY: Social History   Tobacco Use  . Smoking status: Former Smoker    Packs/day: 1.00    Years: 15.00    Pack years: 15.00    Last attempt to quit: 09/03/2001    Years since quitting: 17.1  . Smokeless tobacco: Never Used  Substance Use Topics  . Alcohol use: Yes    Comment: rare  . Drug use: No    FAMILY HISTORY: Family History  Problem Relation Age of Onset  . Heart attack Mother 88  . Hypertension Mother   . Heart disease Mother   . Obesity Mother   . Cancer Father        prostate  . Heart attack Sister 73  . Hypertension Other     ROS: Review of Systems  Constitutional: Positive for weight loss.  Gastrointestinal: Negative for nausea and vomiting.  Musculoskeletal:       Negative muscle weakness  Psychiatric/Behavioral: Positive for depression. Negative for suicidal ideas.    PHYSICAL EXAM: Blood pressure 122/75, pulse 94, height 5\' 9"  (1.753 m), weight (!) 315 lb (142.9 kg), SpO2 96 %. Body mass index is 46.52  kg/m. Physical Exam Vitals signs reviewed.  Constitutional:      Appearance: Normal appearance. She is obese.  Cardiovascular:     Rate and Rhythm: Normal rate.     Pulses: Normal pulses.  Pulmonary:     Effort: Pulmonary effort is normal.     Breath sounds: Normal breath sounds.  Musculoskeletal: Normal range of motion.  Skin:    General: Skin is warm and dry.  Neurological:     Mental Status: She is alert and oriented to person, place, and time.  Psychiatric:        Mood and Affect: Mood normal.        Behavior: Behavior normal.     RECENT LABS AND TESTS: BMET    Component Value Date/Time   NA 139 07/14/2018 1109   K 3.8 07/14/2018 1109   CL 100 07/14/2018 1109   CO2 25 07/14/2018 1109   GLUCOSE 86 07/14/2018 1109   GLUCOSE 86 03/13/2018 1152   BUN 9 07/14/2018 1109   CREATININE 0.74 07/14/2018 1109   CREATININE 0.72 02/03/2014 1648   CALCIUM 9.2 07/14/2018 1109   GFRNONAA 95 07/14/2018 1109   GFRAA 109 07/14/2018 1109   Lab Results  Component Value Date   HGBA1C 5.9 (H) 07/14/2018   HGBA1C 5.9 03/13/2018   HGBA1C 5.8 (H) 09/12/2017   HGBA1C 6.2 (H) 05/23/2017   HGBA1C 6.6 (H) 03/15/2017   Lab Results  Component Value Date   INSULIN 15.4 07/14/2018   INSULIN 12.9 09/12/2017   INSULIN 31.1 (H) 05/23/2017   CBC    Component Value Date/Time   WBC 5.9 03/13/2018 1152   RBC 4.35 03/13/2018 1152   HGB 11.9 (L) 03/13/2018 1152   HGB 12.1 05/23/2017 1025   HCT 35.9 (L) 03/13/2018 1152   HCT 36.6 05/23/2017 1025   PLT 283.0 03/13/2018 1152   MCV 82.4 03/13/2018 1152   MCV 82 05/23/2017 1025   MCH 27.1 05/23/2017 1025   MCH 28.2 02/03/2014 1648   MCHC 33.2 03/13/2018 1152   RDW 15.0 03/13/2018 1152   RDW 14.9 05/23/2017 1025   LYMPHSABS 1.7 05/23/2017 1025   MONOABS 0.4 06/09/2014 0843   EOSABS 0.1 05/23/2017 1025   BASOSABS 0.0 05/23/2017 1025   Iron/TIBC/Ferritin/ %Sat No results found for: IRON, TIBC, FERRITIN, IRONPCTSAT Lipid Panel      Component Value Date/Time   CHOL 140 03/13/2018 1152   CHOL 132 09/12/2017 0927   TRIG 66.0 03/13/2018 1152   HDL 40.00 03/13/2018 1152   HDL 32 (L) 09/12/2017 0927   CHOLHDL 3 03/13/2018 1152   VLDL 13.2 03/13/2018 1152   LDLCALC 86 03/13/2018 1152   LDLCALC 81 09/12/2017 0927   Hepatic Function Panel     Component Value Date/Time   PROT 7.2 07/14/2018 1109   ALBUMIN 4.2 07/14/2018 1109   AST 8 07/14/2018 1109   ALT 8 07/14/2018 1109   ALKPHOS 83 07/14/2018 1109   BILITOT 0.3 07/14/2018 1109   BILIDIR 0.0 06/09/2014 0843   IBILI 0.1 (L) 02/03/2014 1648      Component Value Date/Time   TSH 2.10 03/13/2018 1152   TSH 1.710 05/23/2017 1025   TSH 1.26 03/15/2017 1140      OBESITY BEHAVIORAL INTERVENTION VISIT  Today's visit was # 30   Starting weight: 325 lbs Starting date: 05/23/17 Today's weight : 315 lbs  Today's date: 10/29/2018 Total lbs lost to date: 10    10/29/2018  Height 5\' 9"  (1.753 m)  Weight 315 lb (142.9 kg) (A)  BMI (Calculated) 46.5  BLOOD PRESSURE - SYSTOLIC 470  BLOOD PRESSURE - DIASTOLIC 75   Body Fat % 92.9 %  Total Body Water (lbs) 113.2 lbs     ASK: We discussed the diagnosis of obesity with [name] today and Hadasah agreed to give [name] permission to discuss obesity behavioral modification therapy today.  ASSESS: Simisola has the diagnosis of obesity and her BMI today is 46.5 Autry is in the action stage of change   ADVISE: Grisel was educated on the multiple health risks of obesity as well as the benefit of weight loss to improve her health. She was advised of the need for long term treatment and the importance of lifestyle modifications to improve her current health and to decrease her risk of future health problems.  AGREE: Multiple dietary modification options and treatment options were discussed and  Iyona agreed to follow the recommendations documented in the above note.  ARRANGE: Reighn was educated on the  importance of frequent visits to treat obesity as outlined per CMS and USPSTF guidelines and agreed to schedule her next follow up appointment today.  I, Trixie Dredge, am acting as transcriptionist for Dennard Nip, MD  I have reviewed the above documentation for accuracy and completeness, and I agree with the above. -Dennard Nip, MD

## 2018-11-20 ENCOUNTER — Ambulatory Visit (INDEPENDENT_AMBULATORY_CARE_PROVIDER_SITE_OTHER): Payer: BC Managed Care – PPO | Admitting: Family Medicine

## 2018-11-20 ENCOUNTER — Other Ambulatory Visit: Payer: Self-pay

## 2018-11-20 ENCOUNTER — Encounter (INDEPENDENT_AMBULATORY_CARE_PROVIDER_SITE_OTHER): Payer: Self-pay | Admitting: Family Medicine

## 2018-11-20 VITALS — BP 121/63 | HR 92 | Ht 69.0 in | Wt 311.0 lb

## 2018-11-20 DIAGNOSIS — R7303 Prediabetes: Secondary | ICD-10-CM | POA: Diagnosis not present

## 2018-11-20 DIAGNOSIS — Z9189 Other specified personal risk factors, not elsewhere classified: Secondary | ICD-10-CM

## 2018-11-20 DIAGNOSIS — Z6841 Body Mass Index (BMI) 40.0 and over, adult: Secondary | ICD-10-CM

## 2018-11-20 DIAGNOSIS — E7849 Other hyperlipidemia: Secondary | ICD-10-CM | POA: Diagnosis not present

## 2018-11-20 DIAGNOSIS — F3289 Other specified depressive episodes: Secondary | ICD-10-CM

## 2018-11-20 DIAGNOSIS — E559 Vitamin D deficiency, unspecified: Secondary | ICD-10-CM | POA: Diagnosis not present

## 2018-11-20 MED ORDER — VITAMIN D (ERGOCALCIFEROL) 1.25 MG (50000 UNIT) PO CAPS: 50000.0000 [IU] | ORAL_CAPSULE | ORAL | 0 refills | Status: DC

## 2018-11-20 MED ORDER — BUPROPION HCL ER (SR) 200 MG PO TB12: 200.0000 mg | ORAL_TABLET | Freq: Every day | ORAL | 0 refills | Status: DC

## 2018-11-20 MED FILL — BUPROPION HCL SR 200 MG TAB: 200 | 30 days supply | Qty: 30 | Fill #0

## 2018-11-20 MED FILL — VIT D2 1.25 MG (50,000 UNIT: 1.25 MG | 28 days supply | Qty: 4 | Fill #0

## 2018-11-20 NOTE — Progress Notes (Signed)
Office: [phone]  /  Fax: [phone]   HPI:   Chief Complaint: OBESITY Brandi Lester is here to discuss her progress with her obesity treatment plan. She is on the Category 3 plan and is following her eating plan approximately 75 % of the time. She states she is walking and using weights 45 minutes 3 to 4 times per week. [name] did very well with weight loss on the Category 3 plan, but notes increased emotional eating recently with COVID19, family, and increased stress. She is doing well with meal planning right now.  Her weight is (!) 311 lb (141.1 kg) today and has had a weight loss of 4 pounds over a period of 3 weeks since her last visit. She has lost 14 lbs since starting treatment with [name].  Vitamin D Deficiency Brandi Lester has a diagnosis of vitamin D deficiency. She is currently stable on vit D. [name] denies nausea, vomiting, or muscle weakness.  Depression with emotional eating behaviors Brandi Lester's mood is stable, but she is under a lot more stress with COVID19 and the recent death of her brother in law. She is slightly tearful in the office. Brandi Lester is struggling with emotional eating and using food for comfort to the extent that it is negatively impacting her health. She often snacks when she is not hungry. Brandi Lester sometimes feels she is out of control and then feels guilty that she made poor food choices. She has been working on behavior modification techniques to help reduce her emotional eating and has been somewhat successful.   Pre-Diabetes Brandi Lester has a diagnosis of pre-diabetes based on her elevated Hgb A1c and was informed this puts her at greater risk of developing diabetes. She is taking metformin currently and she is attempting to control with diet and exercise to decrease risk of diabetes. Brandi Lester is doing well. She denies nausea, vomiting, or hypoglycemia.   At risk for diabetes Brandi Lester is at higher than averagerisk for developing diabetes due to her obesity. She  currently denies polyuria or polydipsia.  Hyperlipidemia Brandi Lester has hyperlipidemia and has improved her cholesterol levels with diet, exercise, and weight loss. Brandi Lester is due for labs today. She denies any chest pain.  ASSESSMENT AND PLAN:  Vitamin D deficiency - Plan: VITAMIN D 25 Hydroxy (Vit-D Deficiency, Fractures), Vitamin D, Ergocalciferol, (DRISDOL) 1.25 MG (50000 UT) CAPS capsule  Other depression - Plan: buPROPion (WELLBUTRIN SR) 200 MG 12 hr tablet  Prediabetes - Plan: Comprehensive metabolic panel, Hemoglobin A1c, Insulin, random  Other hyperlipidemia - Plan: Lipid Panel With LDL/HDL Ratio  Class 3 severe obesity with serious comorbidity and body mass index (BMI) of 45.0 to 49.9 in adult, unspecified obesity type (HCC)  PLAN:  Vitamin D Deficiency Brandi Lester was informed that low vitamin D levels contribute to fatigue and are associated with obesity, breast, and colon cancer. Brandi Lester agrees to continue to take prescription Vit D @50 ,000 IU every week #4 with no refills and will follow up for routine testing of vitamin D, at least 2-3 times per year. She was informed of the risk of over-replacement of vitamin D and agrees to not increase her dose unless she discusses this with [name] first. We ordered labs today and [name] agrees to follow up in 2 weeks as directed.  Pre-Diabetes Brandi Lester will continue to work on weight loss, exercise, and decreasing simple carbohydrates in her diet to help decrease the risk of diabetes. She was informed that eating too many simple carbohydrates or too many calories at one sitting increases the  likelihood of GI side effects. We will order labs today and Brandi Lester agrees to continue her diet. Brandi Lester agreed to follow up with [name] as directed to monitor her progress in 2 weeks.   Diabetes risk counselling Brandi Lester was given extended (15 minutes) diabetes prevention counseling today. She is 53 y.o. female and has risk factors for diabetes including obesity. We  discussed intensive lifestyle modifications today with an emphasis on weight loss as well as increasing exercise and decreasing simple carbohydrates in her diet.  Hyperlipidemia Brandi Lester was informed of the American Heart Association Guidelines emphasizing intensive lifestyle modifications as the first line treatment for hyperlipidemia. We discussed many lifestyle modifications today in depth, and Brandi Lester will continue to work on decreasing saturated fats such as fatty red meat, butter and many fried foods. She will also increase vegetables and lean protein in her diet and continue to work on exercise and weight loss efforts. Labs will be drawn today and Brandi Lester agrees to continue her diet. She will follow up at the agreed upon time.  Depression with Emotional Eating Behaviors We discussed behavior modification techniques today to help [name] deal with her emotional eating and depression. She has agreed to increase Wellbutrin SR to 200 mg qAM #30 with no refills and agreed to follow up as directed.  Obesity Brandi Lester is currently in the action stage of change. As such, her goal is to continue with weight loss efforts. She has agreed to follow the Category 3 plan. Brandi Lester has been instructed to work up to a goal of 150 minutes of combined cardio and strengthening exercise per week for weight loss and overall health benefits. We discussed the following Behavioral Modification Strategies today: increasing lean protein intake, work on meal planning and easy cooking plans, emotional eating strategies, ways to avoid boredom eating, better snack choices, and ways to avoid night time snacking.  Brandi Lester has agreed to follow up with our clinic in 2 weeks. She was informed of the importance of frequent follow up visits to maximize her success with intensive lifestyle modifications for her multiple health conditions.  ALLERGIES: Allergies  Allergen Reactions   Penicillins Itching and Swelling    Has patient  had a PCN reaction causing immediate rash, facial/tongue/throat swelling, SOB or lightheadedness with hypotension: yes Has patient had a PCN reaction causing severe rash involving mucus membranes or skin necrosis: no Has patient had a PCN reaction that required hospitalization no Has patient had a PCN reaction occurring within the last 10 years: yes If all of the above answers are "NO", then may proceed with Cephalosporin use.    Victoza [Liraglutide]     Vomiting    MEDICATIONS: Current Outpatient Medications on File Prior to Visit  Medication Sig Dispense Refill   acetaminophen (TYLENOL 8 HOUR) 650 MG CR tablet Take 1 tablet (650 mg total) every 8 (eight) hours as needed by mouth for pain.     amLODipine (NORVASC) 10 MG tablet Take 1 tablet (10 mg total) by mouth daily. 90 tablet 3   aspirin EC 81 MG tablet Take 1 tablet (81 mg total) by mouth daily.     azelastine (ASTELIN) 0.1 % nasal spray Place 2 sprays into both nostrils 2 (two) times daily. Use in each nostril as directed 90 mL 1   benzonatate (TESSALON) 100 MG capsule Take 1 capsule (100 mg total) by mouth 3 (three) times daily as needed for cough. 30 capsule 0   Cyanocobalamin (B-12) 1000 MCG CAPS Take by mouth daily.  doxycycline (VIBRA-TABS) 100 MG tablet Take 1 tablet (100 mg total) by mouth 2 (two) times daily. Can give caps or generic. 14 tablet 0   EPINEPHrine (EPIPEN 2-PAK) 0.3 mg/0.3 mL IJ SOAJ injection Use as directed for severe allergic reaction 2 Device 1   fexofenadine (ALLEGRA ALLERGY) 180 MG tablet Take 1 tablet (180 mg total) by mouth daily. 301 tablet 1   fluticasone (FLONASE) 50 MCG/ACT nasal spray Place 2 sprays into both nostrils daily. (Patient taking differently: Place 2 sprays into both nostrils daily as needed for allergies. ) 16 g 11   glucose blood test strip OneTouch Ultra Test strips     hydrochlorothiazide (HYDRODIURIL) 25 MG tablet Take 1 tablet (25 mg total) by mouth daily. 90 tablet 1     ibuprofen (ADVIL,MOTRIN) 600 MG tablet Take 1 tablet (600 mg total) by mouth every 6 (six) hours as needed. 90 tablet 0   losartan (COZAAR) 100 MG tablet Take 1 tablet (100 mg total) by mouth at bedtime. 90 tablet 1   medroxyPROGESTERone (PROVERA) 10 MG tablet   6   metFORMIN (GLUCOPHAGE) 500 MG tablet Take 1 tablet (500 mg total) by mouth daily with breakfast. 90 tablet 1   montelukast (SINGULAIR) 10 MG tablet Take 1 tablet (10 mg total) by mouth at bedtime as needed. 90 tablet 1   Omega-3 Fatty Acids (FISH OIL PO) Take 1 capsule by mouth daily.     ONETOUCH DELICA LANCETS 53I MISC OneTouch Delica Lancets 33 gauge     pantoprazole (PROTONIX) 40 MG tablet TAKE 1 TABLET (40 MG TOTAL) BY MOUTH DAILY. 90 tablet 1   potassium chloride SA (K-DUR,KLOR-CON) 20 MEQ tablet Take 1 tablet (20 mEq total) by mouth 3 (three) times daily. 270 tablet 1   Probiotic Product (PROBIOTIC-10 PO) Take by mouth daily.     TURMERIC PO Take 1 capsule by mouth daily.     Vitamin D, Ergocalciferol, (DRISDOL) 1.25 MG (50000 UT) CAPS capsule Take 1 capsule (50,000 Units total) by mouth every 7 (seven) days. 4 capsule 0   No current facility-administered medications on file prior to visit.     PAST MEDICAL HISTORY: Past Medical History:  Diagnosis Date   ADD (attention deficit disorder)    Allergy    allergic rhinitis   Anemia 07/25/2013   Angio-edema    Back pain    Costochondritis 02/20/2015   Diabetes mellitus    Type II   previously 3 years ago - no longer on meds    Dyslipidemia 07/25/2013   Fungus infection 10/13   Left great toe   GERD (gastroesophageal reflux disease)    Hypertension    IBS (irritable bowel syndrome)    Ingrown nail 10/13   right foot next to the last toe   Insomnia 04/20/2013   Joint pain    Lactose intolerance    Leg edema    Obesity    Pedal edema 02/20/2015   Prediabetes    Preventative health care 09/25/2015   PVC (premature ventricular  contraction)     PAST SURGICAL HISTORY: Past Surgical History:  Procedure Laterality Date   HYSTEROSCOPY N/A 10/04/2015   Procedure: HYSTEROSCOPY with Removal IUD;  Surgeon: Vanessa Kick, MD;  Location: [facility];  Service: Gynecology;  Laterality: N/A;   lasik     eye surgery   WISDOM TOOTH EXTRACTION      SOCIAL HISTORY: Social History   Tobacco Use   Smoking status: Former Smoker    Packs/day: 1.00  Years: 15.00    Pack years: 15.00    Last attempt to quit: 09/03/2001    Years since quitting: 17.2   Smokeless tobacco: Never Used  Substance Use Topics   Alcohol use: Yes    Comment: rare   Drug use: No    FAMILY HISTORY: Family History  Problem Relation Age of Onset   Heart attack Mother 85   Hypertension Mother    Heart disease Mother    Obesity Mother    Cancer Father        prostate   Heart attack Sister 52   Hypertension Other    ROS: Review of Systems  Constitutional: Positive for weight loss.  Cardiovascular: Negative for chest pain.  Gastrointestinal: Negative for nausea and vomiting.  Musculoskeletal:       Negative for muscle weakness.  Endo/Heme/Allergies:       Negative for hypoglycemia.  Psychiatric/Behavioral: Positive for depression.   PHYSICAL EXAM: Blood pressure 121/63, pulse 92, height 5\' 9"  (1.753 m), weight (!) 311 lb (141.1 kg), SpO2 98 %. Body mass index is 45.93 kg/m. Physical Exam Vitals signs reviewed.  Constitutional:      Appearance: Normal appearance. She is obese.  Cardiovascular:     Rate and Rhythm: Normal rate.  Pulmonary:     Effort: Pulmonary effort is normal.  Musculoskeletal: Normal range of motion.  Skin:    General: Skin is warm and dry.  Neurological:     Mental Status: She is alert and oriented to person, place, and time.  Psychiatric:        Mood and Affect: Mood normal.        Behavior: Behavior normal.    RECENT LABS AND TESTS: BMET    Component Value Date/Time   NA 139 07/14/2018 1109     K 3.8 07/14/2018 1109   CL 100 07/14/2018 1109   CO2 25 07/14/2018 1109   GLUCOSE 86 07/14/2018 1109   GLUCOSE 86 03/13/2018 1152   BUN 9 07/14/2018 1109   CREATININE 0.74 07/14/2018 1109   CREATININE 0.72 02/03/2014 1648   CALCIUM 9.2 07/14/2018 1109   GFRNONAA 95 07/14/2018 1109   GFRAA 109 07/14/2018 1109   Lab Results  Component Value Date   HGBA1C 5.9 (H) 07/14/2018   HGBA1C 5.9 03/13/2018   HGBA1C 5.8 (H) 09/12/2017   HGBA1C 6.2 (H) 05/23/2017   HGBA1C 6.6 (H) 03/15/2017   Lab Results  Component Value Date   INSULIN 15.4 07/14/2018   INSULIN 12.9 09/12/2017   INSULIN 31.1 (H) 05/23/2017   CBC    Component Value Date/Time   WBC 5.9 03/13/2018 1152   RBC 4.35 03/13/2018 1152   HGB 11.9 (L) 03/13/2018 1152   HGB 12.1 05/23/2017 1025   HCT 35.9 (L) 03/13/2018 1152   HCT 36.6 05/23/2017 1025   PLT 283.0 03/13/2018 1152   MCV 82.4 03/13/2018 1152   MCV 82 05/23/2017 1025   MCH 27.1 05/23/2017 1025   MCH 28.2 02/03/2014 1648   MCHC 33.2 03/13/2018 1152   RDW 15.0 03/13/2018 1152   RDW 14.9 05/23/2017 1025   LYMPHSABS 1.7 05/23/2017 1025   MONOABS 0.4 06/09/2014 0843   EOSABS 0.1 05/23/2017 1025   BASOSABS 0.0 05/23/2017 1025   Iron/TIBC/Ferritin/ %Sat No results found for: IRON, TIBC, FERRITIN, IRONPCTSAT Lipid Panel     Component Value Date/Time   CHOL 140 03/13/2018 1152   CHOL 132 09/12/2017 0927   TRIG 66.0 03/13/2018 1152   HDL 40.00 03/13/2018 1152  HDL 32 (L) 09/12/2017 0927   CHOLHDL 3 03/13/2018 1152   VLDL 13.2 03/13/2018 1152   LDLCALC 86 03/13/2018 1152   LDLCALC 81 09/12/2017 0927   Hepatic Function Panel     Component Value Date/Time   PROT 7.2 07/14/2018 1109   ALBUMIN 4.2 07/14/2018 1109   AST 8 07/14/2018 1109   ALT 8 07/14/2018 1109   ALKPHOS 83 07/14/2018 1109   BILITOT 0.3 07/14/2018 1109   BILIDIR 0.0 06/09/2014 0843   IBILI 0.1 (L) 02/03/2014 1648      Component Value Date/Time   TSH 2.10 03/13/2018 1152   TSH  1.710 05/23/2017 1025   TSH 1.26 03/15/2017 1140   Results for JANICA, ELDRED (MRN [id number]) as of 11/20/2018 07:51  Ref. Range 07/14/2018 11:09  Vitamin D, 25-Hydroxy Latest Ref Range: 30.0 - 100.0 ng/mL 45.2   OBESITY BEHAVIORAL INTERVENTION VISIT  Today's visit was # 31   Starting weight: 325 lbs Starting date: 05/23/17 Today's weight : Weight: (!) 311 lb (141.1 kg)  Today's date: 11/20/2018 Total lbs lost to date: 14    11/20/2018  Height 5\' 9"  (1.753 m)  Weight 311 lb (141.1 kg) (A)  BMI (Calculated) 45.91  BLOOD PRESSURE - SYSTOLIC 638  BLOOD PRESSURE - DIASTOLIC 63   Body Fat % 75.6 %  Total Body Water (lbs) 109.8 lbs   ASK: We discussed the diagnosis of obesity with [name] today and Darneisha agreed to give [name] permission to discuss obesity behavioral modification therapy today.  ASSESS: Kimi has the diagnosis of obesity and her BMI today is 45.91. Desarie is in the action stage of change.   ADVISE: Jesselle was educated on the multiple health risks of obesity as well as the benefit of weight loss to improve her health. She was advised of the need for long term treatment and the importance of lifestyle modifications to improve her current health and to decrease her risk of future health problems.  AGREE: Multiple dietary modification options and treatment options were discussed and Linzey agreed to follow the recommendations documented in the above note.  ARRANGE: Takeya was educated on the importance of frequent visits to treat obesity as outlined per CMS and USPSTF guidelines and agreed to schedule her next follow up appointment today.  IMarcille Blanco, CMA, am acting as transcriptionist for Starlyn Skeans, MD  I have reviewed the above documentation for accuracy and completeness, and I agree with the above. -Dennard Nip, MD

## 2018-11-21 LAB — COMPREHENSIVE METABOLIC PANEL
ALT: 11 IU/L (ref 0–32)
AST: 9 IU/L (ref 0–40)
Albumin/Globulin Ratio: 1.6 (ref 1.2–2.2)
Albumin: 4.2 g/dL (ref 3.8–4.8)
Alkaline Phosphatase: 80 IU/L (ref 39–117)
BUN / CREAT RATIO: 14 (ref 9–23)
BUN: 10 mg/dL (ref 6–24)
Bilirubin Total: 0.3 mg/dL (ref 0.0–1.2)
CO2: 25 mmol/L (ref 20–29)
Calcium: 8.9 mg/dL (ref 8.7–10.2)
Chloride: 101 mmol/L (ref 96–106)
Creatinine, Ser: 0.73 mg/dL (ref 0.57–1.00)
GFR calc Af Amer: 111 mL/min/{1.73_m2} (ref >59)
GFR calc non Af Amer: 96 mL/min/{1.73_m2} (ref >59)
Globulin, Total: 2.7 g/dL (ref 1.5–4.5)
Glucose: 98 mg/dL (ref 65–99)
Potassium: 3.9 mmol/L (ref 3.5–5.2)
Sodium: 142 mmol/L (ref 134–144)
Total Protein: 6.9 g/dL (ref 6.0–8.5)

## 2018-11-21 LAB — LIPID PANEL WITH LDL/HDL RATIO
Cholesterol, Total: 146 mg/dL (ref 100–199)
HDL: 35 mg/dL — ABNORMAL LOW (ref >39)
LDL Calculated: 93 mg/dL (ref 0–99)
LDl/HDL Ratio: 2.7 ratio (ref 0.0–3.2)
Triglycerides: 89 mg/dL (ref 0–149)
VLDL Cholesterol Cal: 18 mg/dL (ref 5–40)

## 2018-11-21 LAB — INSULIN, RANDOM: INSULIN: 21.2 u[IU]/mL (ref 2.6–24.9)

## 2018-11-21 LAB — HEMOGLOBIN A1C
Est. average glucose Bld gHb Est-mCnc: 128 mg/dL
Hgb A1c MFr Bld: 6.1 % — ABNORMAL HIGH (ref 4.8–5.6)

## 2018-11-21 LAB — VITAMIN D 25 HYDROXY (VIT D DEFICIENCY, FRACTURES): Vit D, 25-Hydroxy: 47.9 ng/mL (ref 30.0–100.0)

## 2018-11-21 MED FILL — metFORMIN HCL 500 MG TABS: 500 | 30 days supply | Qty: 30 | Fill #1

## 2018-11-21 MED FILL — HYDROCHLOROTHIAZIDE 25 MG T: 25 | 30 days supply | Qty: 30 | Fill #1

## 2018-11-21 MED FILL — AMLODIPINE BESYLATE 10 MG T: 10 | 30 days supply | Qty: 30 | Fill #1

## 2018-11-21 MED FILL — LOSARTAN POTASSIUM 100 MG T: 100 | 30 days supply | Qty: 30 | Fill #1

## 2018-11-21 MED FILL — POTASSIUM CHLORIDE CRYS ER: 20 | 30 days supply | Qty: 90 | Fill #1

## 2018-11-25 ENCOUNTER — Encounter (INDEPENDENT_AMBULATORY_CARE_PROVIDER_SITE_OTHER): Payer: Self-pay

## 2018-11-30 ENCOUNTER — Encounter (INDEPENDENT_AMBULATORY_CARE_PROVIDER_SITE_OTHER): Payer: Self-pay | Admitting: Family Medicine

## 2018-12-04 ENCOUNTER — Encounter (INDEPENDENT_AMBULATORY_CARE_PROVIDER_SITE_OTHER): Payer: Self-pay | Admitting: Family Medicine

## 2018-12-04 ENCOUNTER — Other Ambulatory Visit: Payer: Self-pay

## 2018-12-04 ENCOUNTER — Ambulatory Visit (INDEPENDENT_AMBULATORY_CARE_PROVIDER_SITE_OTHER): Payer: BC Managed Care – PPO | Admitting: Family Medicine

## 2018-12-04 DIAGNOSIS — Z6841 Body Mass Index (BMI) 40.0 and over, adult: Secondary | ICD-10-CM

## 2018-12-04 DIAGNOSIS — E559 Vitamin D deficiency, unspecified: Secondary | ICD-10-CM | POA: Diagnosis not present

## 2018-12-04 DIAGNOSIS — Z711 Person with feared health complaint in whom no diagnosis is made: Secondary | ICD-10-CM

## 2018-12-08 MED ORDER — VITAMIN D (ERGOCALCIFEROL) 1.25 MG (50000 UNIT) PO CAPS: 50000.0000 [IU] | ORAL_CAPSULE | ORAL | 0 refills | Status: DC

## 2018-12-08 NOTE — Progress Notes (Signed)
Office: [phone]  /  Fax: [phone] TeleHealth Visit:  Brandi Lester has verbally consented to this TeleHealth visit today. The patient is located at home, the provider is located at the [facility] office. The participants in this visit include the listed provider and patient. The visit was conducted today via Face Time.  HPI:   Chief Complaint: OBESITY Brandi Lester is here to discuss her progress with her obesity treatment plan. She is on the Category 3 plan and is following her eating plan approximately 85 % of the time. She states she is doing some walking. Brandi Lester feels that she has done well with maintaining her weight while off work for [employer]. She has had some increased carb snacking and sugar intake in the last 1 to 2 weeks. She is skipping meals at this time as well.  We were unable to weigh the patient today for this TeleHealth visit. She feels as if she has maintained weight since her last visit. She has lost 14 lbs since starting treatment with [name].  Vitamin D Deficiency Brandi Lester has a diagnosis of vitamin D deficiency. She is currently stable on vit D, but is not yet at goal. Brandi Lester denies nausea, vomiting, or muscle weakness.  Vitamin D Deficiency Brandi Lester has questions about COVID 19 including how long the virus could stay on clothing, etc. She is nervous about going out to grocery shop, etc.  ASSESSMENT AND PLAN:  Vitamin D deficiency - Plan: Vitamin D, Ergocalciferol, (DRISDOL) 1.25 MG (50000 UT) CAPS capsule  Worried well  Class 3 severe obesity with serious comorbidity and body mass index (BMI) of 45.0 to 49.9 in adult, unspecified obesity type ([place])  PLAN:  Vitamin D Deficiency Brandi Lester was informed that low vitamin D levels contribute to fatigue and are associated with obesity, breast, and colon cancer. Brandi Lester agrees to continue to take prescription Vit D @50 ,000 IU every week #4 with no refills and will follow up for routine testing of vitamin  D, at least 2-3 times per year. She was informed of the risk of over-replacement of vitamin D and agrees to not increase her dose unless she discusses this with [name] first. Brandi Lester agrees to follow up in 2 weeks as directed.  Worried Well Brandi Lester was educated with current knowledge of [place] and reasonable precautions on how to grocery shop in [place], and 6 feet distancing, etc. She was advised on the need for no travel and hand washing/hand sanitizing. We discussed symptoms of COVID19 and we discussed the possible need for sequestration and how to deal with this if it happens. Pt offered guidance and reassurance.  Obesity Brandi Lester is currently in the action stage of change. As such, her goal is to continue with weight loss efforts. She has agreed to follow the Category 3 plan. Brandi Lester has been instructed to work up to a goal of 150 minutes of combined cardio and strengthening exercise per week for weight loss and overall health benefits. We discussed the following Behavioral Modification Strategies today: decreasing simple carbohydrates, work on meal planning and easy cooking plans, emotional eating strategies, ways to avoid boredom eating, and ways to avoid night time snacking.  Brandi Lester has agreed to follow up with our clinic in 2 weeks. She was informed of the importance of frequent follow up visits to maximize her success with intensive lifestyle modifications for her multiple health conditions.  ALLERGIES: Allergies  Allergen Reactions  . Penicillins Itching and Swelling    Has patient had a PCN reaction  causing immediate rash, facial/tongue/throat swelling, SOB or lightheadedness with hypotension: yes Has patient had a PCN reaction causing severe rash involving mucus membranes or skin necrosis: no Has patient had a PCN reaction that required hospitalization no Has patient had a PCN reaction occurring within the last 10 years: yes If all of the above answers are "NO", then may proceed with  Cephalosporin use.   Brandi Lester [Liraglutide]     Vomiting    MEDICATIONS: Current Outpatient Medications on File Prior to Visit  Medication Sig Dispense Refill  . acetaminophen (TYLENOL 8 HOUR) 650 MG CR tablet Take 1 tablet (650 mg total) every 8 (eight) hours as needed by mouth for pain.    [name] amLODipine (NORVASC) 10 MG tablet Take 1 tablet (10 mg total) by mouth daily. 90 tablet 3  . aspirin EC 81 MG tablet Take 1 tablet (81 mg total) by mouth daily.    [name] azelastine (ASTELIN) 0.1 % nasal spray Place 2 sprays into both nostrils 2 (two) times daily. Use in each nostril as directed 90 mL 1  . benzonatate (TESSALON) 100 MG capsule Take 1 capsule (100 mg total) by mouth 3 (three) times daily as needed for cough. 30 capsule 0  . buPROPion (WELLBUTRIN SR) 200 MG 12 hr tablet Take 1 tablet (200 mg total) by mouth daily. 30 tablet 0  . Cyanocobalamin (B-12) 1000 MCG CAPS Take by mouth daily.    [name] doxycycline (VIBRA-TABS) 100 MG tablet Take 1 tablet (100 mg total) by mouth 2 (two) times daily. Can give caps or generic. 14 tablet 0  . EPINEPHrine (EPIPEN 2-PAK) 0.3 mg/0.3 mL IJ SOAJ injection Use as directed for severe allergic reaction 2 Device 1  . fexofenadine (ALLEGRA ALLERGY) 180 MG tablet Take 1 tablet (180 mg total) by mouth daily. 301 tablet 1  . fluticasone (FLONASE) 50 MCG/ACT nasal spray Place 2 sprays into both nostrils daily. (Patient taking differently: Place 2 sprays into both nostrils daily as needed for allergies. ) 16 g 11  . glucose blood test strip OneTouch Ultra Test strips    . hydrochlorothiazide (HYDRODIURIL) 25 MG tablet Take 1 tablet (25 mg total) by mouth daily. 90 tablet 1  . ibuprofen (ADVIL,MOTRIN) 600 MG tablet Take 1 tablet (600 mg total) by mouth every 6 (six) hours as needed. 90 tablet 0  . losartan (COZAAR) 100 MG tablet Take 1 tablet (100 mg total) by mouth at bedtime. 90 tablet 1  . medroxyPROGESTERone (PROVERA) 10 MG tablet   6  . metFORMIN (GLUCOPHAGE) 500 MG  tablet Take 1 tablet (500 mg total) by mouth daily with breakfast. 90 tablet 1  . montelukast (SINGULAIR) 10 MG tablet Take 1 tablet (10 mg total) by mouth at bedtime as needed. 90 tablet 1  . Omega-3 Fatty Acids (FISH OIL PO) Take 1 capsule by mouth daily.    Brandi Lester DELICA LANCETS 31V MISC OneTouch Delica Lancets 33 gauge    . pantoprazole (PROTONIX) 40 MG tablet TAKE 1 TABLET (40 MG TOTAL) BY MOUTH DAILY. 90 tablet 1  . potassium chloride SA (K-DUR,KLOR-CON) 20 MEQ tablet Take 1 tablet (20 mEq total) by mouth 3 (three) times daily. 270 tablet 1  . Probiotic Product (PROBIOTIC-10 PO) Take by mouth daily.    . TURMERIC PO Take 1 capsule by mouth daily.    . Vitamin D, Ergocalciferol, (DRISDOL) 1.25 MG (50000 UT) CAPS capsule Take 1 capsule (50,000 Units total) by mouth every 7 (seven) days. 4 capsule 0  No current facility-administered medications on file prior to visit.     PAST MEDICAL HISTORY: Past Medical History:  Diagnosis Date  . ADD (attention deficit disorder)   . Allergy    allergic rhinitis  . Anemia 07/25/2013  . Angio-edema   . Back pain   . Costochondritis 02/20/2015  . Diabetes mellitus    Type II   previously 3 years ago - no longer on meds   . Dyslipidemia 07/25/2013  . Fungus infection 10/13   Left great toe  . GERD (gastroesophageal reflux disease)   . Hypertension   . IBS (irritable bowel syndrome)   . Ingrown nail 10/13   right foot next to the last toe  . Insomnia 04/20/2013  . Joint pain   . Lactose intolerance   . Leg edema   . Obesity   . Pedal edema 02/20/2015  . Prediabetes   . Preventative health care 09/25/2015  . PVC (premature ventricular contraction)     PAST SURGICAL HISTORY: Past Surgical History:  Procedure Laterality Date  . HYSTEROSCOPY N/A 10/04/2015   Procedure: HYSTEROSCOPY with Removal IUD;  Surgeon: Vanessa Kick, MD;  Location: [facility];  Service: Gynecology;  Laterality: N/A;  . lasik     eye surgery  . WISDOM TOOTH EXTRACTION       SOCIAL HISTORY: Social History   Tobacco Use  . Smoking status: Former Smoker    Packs/day: 1.00    Years: 15.00    Pack years: 15.00    Last attempt to quit: 09/03/2001    Years since quitting: 17.2  . Smokeless tobacco: Never Used  Substance Use Topics  . Alcohol use: Yes    Comment: rare  . Drug use: No    FAMILY HISTORY: Family History  Problem Relation Age of Onset  . Heart attack Mother 2  . Hypertension Mother   . Heart disease Mother   . Obesity Mother   . Cancer Father        prostate  . Heart attack Sister 18  . Hypertension Other     ROS: Review of Systems  Gastrointestinal: Negative for nausea and vomiting.  Musculoskeletal:       Negative for muscle weakness.    PHYSICAL EXAM: Pt in no acute distress  RECENT LABS AND TESTS: BMET    Component Value Date/Time   NA 142 11/20/2018 0828   K 3.9 11/20/2018 0828   CL 101 11/20/2018 0828   CO2 25 11/20/2018 0828   GLUCOSE 98 11/20/2018 0828   GLUCOSE 86 03/13/2018 1152   BUN 10 11/20/2018 0828   CREATININE 0.73 11/20/2018 0828   CREATININE 0.72 02/03/2014 1648   CALCIUM 8.9 11/20/2018 0828   GFRNONAA 96 11/20/2018 0828   GFRAA 111 11/20/2018 0828   Lab Results  Component Value Date   HGBA1C 6.1 (H) 11/20/2018   HGBA1C 5.9 (H) 07/14/2018   HGBA1C 5.9 03/13/2018   HGBA1C 5.8 (H) 09/12/2017   HGBA1C 6.2 (H) 05/23/2017   Lab Results  Component Value Date   INSULIN 21.2 11/20/2018   INSULIN 15.4 07/14/2018   INSULIN 12.9 09/12/2017   INSULIN 31.1 (H) 05/23/2017   CBC    Component Value Date/Time   WBC 5.9 03/13/2018 1152   RBC 4.35 03/13/2018 1152   HGB 11.9 (L) 03/13/2018 1152   HGB 12.1 05/23/2017 1025   HCT 35.9 (L) 03/13/2018 1152   HCT 36.6 05/23/2017 1025   PLT 283.0 03/13/2018 1152   MCV 82.4 03/13/2018 1152  MCV 82 05/23/2017 1025   MCH 27.1 05/23/2017 1025   MCH 28.2 02/03/2014 1648   MCHC 33.2 03/13/2018 1152   RDW 15.0 03/13/2018 1152   RDW 14.9 05/23/2017 1025    LYMPHSABS 1.7 05/23/2017 1025   MONOABS 0.4 06/09/2014 0843   EOSABS 0.1 05/23/2017 1025   BASOSABS 0.0 05/23/2017 1025   Iron/TIBC/Ferritin/ %Sat No results found for: IRON, TIBC, FERRITIN, IRONPCTSAT Lipid Panel     Component Value Date/Time   CHOL 146 11/20/2018 0828   TRIG 89 11/20/2018 0828   HDL 35 (L) 11/20/2018 0828   CHOLHDL 3 03/13/2018 1152   VLDL 13.2 03/13/2018 1152   LDLCALC 93 11/20/2018 0828   Hepatic Function Panel     Component Value Date/Time   PROT 6.9 11/20/2018 0828   ALBUMIN 4.2 11/20/2018 0828   AST 9 11/20/2018 0828   ALT 11 11/20/2018 0828   ALKPHOS 80 11/20/2018 0828   BILITOT 0.3 11/20/2018 0828   BILIDIR 0.0 06/09/2014 0843   IBILI 0.1 (L) 02/03/2014 1648      Component Value Date/Time   TSH 2.10 03/13/2018 1152   TSH 1.710 05/23/2017 1025   TSH 1.26 03/15/2017 1140   Results for [name], ALLIE (MRN [id number]) as of 12/08/2018 08:44  Ref. Range 11/20/2018 08:28  Vitamin D, 25-Hydroxy Latest Ref Range: 30.0 - 100.0 ng/mL 47.9     I, Marcille Blanco, CMA, am acting as transcriptionist for Starlyn Skeans, MD I have reviewed the above documentation for accuracy and completeness, and I agree with the above. -Dennard Nip, MD

## 2018-12-18 ENCOUNTER — Other Ambulatory Visit: Payer: Self-pay

## 2018-12-18 ENCOUNTER — Ambulatory Visit (INDEPENDENT_AMBULATORY_CARE_PROVIDER_SITE_OTHER): Payer: BC Managed Care – PPO | Admitting: Family Medicine

## 2018-12-18 ENCOUNTER — Encounter (INDEPENDENT_AMBULATORY_CARE_PROVIDER_SITE_OTHER): Payer: Self-pay | Admitting: Family Medicine

## 2018-12-18 DIAGNOSIS — F3289 Other specified depressive episodes: Secondary | ICD-10-CM

## 2018-12-18 DIAGNOSIS — Z6841 Body Mass Index (BMI) 40.0 and over, adult: Secondary | ICD-10-CM

## 2018-12-18 DIAGNOSIS — E119 Type 2 diabetes mellitus without complications: Secondary | ICD-10-CM | POA: Diagnosis not present

## 2018-12-18 MED ORDER — BUPROPION HCL ER (SR) 200 MG PO TB12: 200.0000 mg | ORAL_TABLET | Freq: Every day | ORAL | 0 refills | Status: DC

## 2018-12-18 MED ORDER — METFORMIN HCL 500 MG PO TABS: 500.0000 mg | ORAL_TABLET | Freq: Every day | ORAL | 0 refills | Status: DC

## 2018-12-18 MED FILL — BUPROPION HCL SR 200 MG TAB: 200 | 30 days supply | Qty: 30 | Fill #0

## 2018-12-18 MED FILL — metFORMIN HCL 500 MG TABS: 500 | 60 days supply | Qty: 60 | Fill #0

## 2018-12-19 MED FILL — PANTOPRAZOLE SOD DR 40 MG T: 40 | 90 days supply | Qty: 90 | Fill #1

## 2018-12-22 ENCOUNTER — Other Ambulatory Visit (INDEPENDENT_AMBULATORY_CARE_PROVIDER_SITE_OTHER): Payer: Self-pay

## 2018-12-22 DIAGNOSIS — E119 Type 2 diabetes mellitus without complications: Secondary | ICD-10-CM

## 2018-12-22 MED FILL — VIT D2 1.25 MG (50,000 UNIT: 1.25 MG | 28 days supply | Qty: 4 | Fill #0

## 2018-12-22 NOTE — Progress Notes (Signed)
Office: [phone]  /  Fax: [phone] TeleHealth Visit:  Brandi Lester has verbally consented to this TeleHealth visit today. The patient is located at home, the provider is located at the [facility] office. The participants in this visit include the listed provider and patient. Brandi Lester was unable to use realtime audiovisual technology today and the telehealth visit was conducted via telephone.   HPI:   Chief Complaint: OBESITY Brandi Lester is here to discuss her progress with her obesity treatment plan. She is on the Category 3 plan and is following her eating plan approximately 70 % of the time. She states she is power walking for 10-20 minutes 3 times per week. Brandi Lester feels she has done well maintaining her weight since her last visit. Her hunger is controlled, in fact she sometimes skips meals. She is starting to increase exercise.  We were unable to weigh the patient today for this TeleHealth visit. She feels as if she has maintained her weight since her last visit. She has lost 14 lbs since starting treatment with [name].  Diabetes II Brandi Lester has a diagnosis of diabetes type II. Brandi Lester is stable on metformin. She notes decreased polyphagia, and denies nausea, vomiting, or hypoglycemia. She states she is not checking blood sugars. Last A1c was 6.1. She has been working on intensive lifestyle modifications including diet, exercise, and weight loss to help control her blood glucose levels.   Depression with Emotional Eating Behaviors Brandi Lester mood is stable on Wellbutrin and she denies insomnia. She feels more in control of her emotional eating. Brandi Lester struggles with emotional eating and using food for comfort to the extent that it is negatively impacting her health. She often snacks when she is not hungry. Brandi Lester sometimes feels she is out of control and then feels guilty that she made poor food choices. She has been working on behavior modification techniques to help  reduce her emotional eating and has been somewhat successful. She shows no sign of suicidal or homicidal ideations.  Depression screen [facility] 2/9 05/23/2017 07/03/2016 04/13/2016  Decreased Interest 2 0 0  Down, Depressed, Hopeless 1 0 0  PHQ - 2 Score 3 0 0  Altered sleeping 1 - -  Tired, decreased energy 1 - -  Change in appetite 1 - -  Feeling bad or failure about yourself  0 - -  Trouble concentrating 1 - -  Moving slowly or fidgety/restless 0 - -  Suicidal thoughts 0 - -  PHQ-9 Score 7 - -  Difficult doing work/chores Not difficult at all - -    ASSESSMENT AND PLAN:  Type 2 diabetes mellitus without complication, without long-term current use of insulin (HCC) - Plan: metFORMIN (GLUCOPHAGE) 500 MG tablet  Other depression - with emotional eating - Plan: buPROPion (WELLBUTRIN SR) 200 MG 12 hr tablet  Class 3 severe obesity with serious comorbidity and body mass index (BMI) of 45.0 to 49.9 in adult, unspecified obesity type (HCC)  PLAN:  Diabetes II Brandi Lester has been given extensive diabetes education by myself today including ideal fasting and post-prandial blood glucose readings, individual ideal Hgb A1c goals and hypoglycemia prevention. We discussed the importance of good blood sugar control to decrease the likelihood of diabetic complications such as nephropathy, neuropathy, limb loss, blindness, coronary artery disease, and death. We discussed the importance of intensive lifestyle modification including diet, exercise and weight loss as the first line treatment for diabetes. Brandi Lester agrees to continue taking metformin 500 mg 1 and 1/2 tablet q  AM #60 and we will refill for 1 month. Brandi Lester agrees to follow up with our clinic in 2 weeks with Dr. Adair Patter.  Depression with Emotional Eating Behaviors We discussed behavior modification techniques today to help Brandi Lester deal with her emotional eating and depression. Brandi Lester agrees to continue taking Wellbutrin SR 200 mg qd #30 and we will  refill for 1 month. Brandi Lester agrees to follow up with our clinic in 2 weeks with Dr. Adair Patter.  Obesity Brandi Lester is currently in the action stage of change. As such, her goal is to continue with weight loss efforts She has agreed to follow the Category 3 plan Brandi Lester has been instructed to work up to a goal of 150 minutes of combined cardio and strengthening exercise per week or as is for weight loss and overall health benefits. We discussed the following Behavioral Modification Strategies today: ways to avoid boredom eating, ways to avoid night time snacking, no skipping meals, and better snacking choices   Brandi Lester has agreed to follow up with our clinic in 2 weeks with Dr. Adair Patter. She was informed of the importance of frequent follow up visits to maximize her success with intensive lifestyle modifications for her multiple health conditions.  ALLERGIES: Allergies  Allergen Reactions  . Penicillins Itching and Swelling    Has patient had a PCN reaction causing immediate rash, facial/tongue/throat swelling, SOB or lightheadedness with hypotension: yes Has patient had a PCN reaction causing severe rash involving mucus membranes or skin necrosis: no Has patient had a PCN reaction that required hospitalization no Has patient had a PCN reaction occurring within the last 10 years: yes If all of the above answers are "NO", then may proceed with Cephalosporin use.   Brandi Lester [Liraglutide]     Vomiting    MEDICATIONS: Current Outpatient Medications on File Prior to Visit  Medication Sig Dispense Refill  . acetaminophen (TYLENOL 8 HOUR) 650 MG CR tablet Take 1 tablet (650 mg total) every 8 (eight) hours as needed by mouth for pain.    [name] amLODipine (NORVASC) 10 MG tablet Take 1 tablet (10 mg total) by mouth daily. 90 tablet 3  . aspirin EC 81 MG tablet Take 1 tablet (81 mg total) by mouth daily.    [name] azelastine (ASTELIN) 0.1 % nasal spray Place 2 sprays into both nostrils 2 (two) times daily. Use  in each nostril as directed 90 mL 1  . benzonatate (TESSALON) 100 MG capsule Take 1 capsule (100 mg total) by mouth 3 (three) times daily as needed for cough. 30 capsule 0  . Cyanocobalamin (B-12) 1000 MCG CAPS Take by mouth daily.    [name] doxycycline (VIBRA-TABS) 100 MG tablet Take 1 tablet (100 mg total) by mouth 2 (two) times daily. Can give caps or generic. 14 tablet 0  . EPINEPHrine (EPIPEN 2-PAK) 0.3 mg/0.3 mL IJ SOAJ injection Use as directed for severe allergic reaction 2 Device 1  . fexofenadine (ALLEGRA ALLERGY) 180 MG tablet Take 1 tablet (180 mg total) by mouth daily. 301 tablet 1  . fluticasone (FLONASE) 50 MCG/ACT nasal spray Place 2 sprays into both nostrils daily. (Patient taking differently: Place 2 sprays into both nostrils daily as needed for allergies. ) 16 g 11  . glucose blood test strip OneTouch Ultra Test strips    . hydrochlorothiazide (HYDRODIURIL) 25 MG tablet Take 1 tablet (25 mg total) by mouth daily. 90 tablet 1  . ibuprofen (ADVIL,MOTRIN) 600 MG tablet Take 1 tablet (600 mg total) by mouth every 6 (  six) hours as needed. 90 tablet 0  . losartan (COZAAR) 100 MG tablet Take 1 tablet (100 mg total) by mouth at bedtime. 90 tablet 1  . medroxyPROGESTERone (PROVERA) 10 MG tablet   6  . montelukast (SINGULAIR) 10 MG tablet Take 1 tablet (10 mg total) by mouth at bedtime as needed. 90 tablet 1  . Omega-3 Fatty Acids (FISH OIL PO) Take 1 capsule by mouth daily.    [name] DELICA LANCETS 68L MISC OneTouch Delica Lancets 33 gauge    . pantoprazole (PROTONIX) 40 MG tablet TAKE 1 TABLET (40 MG TOTAL) BY MOUTH DAILY. 90 tablet 1  . potassium chloride SA (K-DUR,KLOR-CON) 20 MEQ tablet Take 1 tablet (20 mEq total) by mouth 3 (three) times daily. 270 tablet 1  . Probiotic Product (PROBIOTIC-10 PO) Take by mouth daily.    . TURMERIC PO Take 1 capsule by mouth daily.    . Vitamin D, Ergocalciferol, (DRISDOL) 1.25 MG (50000 UT) CAPS capsule Take 1 capsule (50,000 Units total) by mouth  every 7 (seven) days. 4 capsule 0   No current facility-administered medications on file prior to visit.     PAST MEDICAL HISTORY: Past Medical History:  Diagnosis Date  . ADD (attention deficit disorder)   . Allergy    allergic rhinitis  . Anemia 07/25/2013  . Angio-edema   . Back pain   . Costochondritis 02/20/2015  . Diabetes mellitus    Type II   previously 3 years ago - no longer on meds   . Dyslipidemia 07/25/2013  . Fungus infection 10/13   Left great toe  . GERD (gastroesophageal reflux disease)   . Hypertension   . IBS (irritable bowel syndrome)   . Ingrown nail 10/13   right foot next to the last toe  . Insomnia 04/20/2013  . Joint pain   . Lactose intolerance   . Leg edema   . Obesity   . Pedal edema 02/20/2015  . Prediabetes   . Preventative health care 09/25/2015  . PVC (premature ventricular contraction)     PAST SURGICAL HISTORY: Past Surgical History:  Procedure Laterality Date  . HYSTEROSCOPY N/A 10/04/2015   Procedure: HYSTEROSCOPY with Removal IUD;  Surgeon: Vanessa Kick, MD;  Location: [facility];  Service: Gynecology;  Laterality: N/A;  . lasik     eye surgery  . WISDOM TOOTH EXTRACTION      SOCIAL HISTORY: Social History   Tobacco Use  . Smoking status: Former Smoker    Packs/day: 1.00    Years: 15.00    Pack years: 15.00    Last attempt to quit: 09/03/2001    Years since quitting: 17.3  . Smokeless tobacco: Never Used  Substance Use Topics  . Alcohol use: Yes    Comment: rare  . Drug use: No    FAMILY HISTORY: Family History  Problem Relation Age of Onset  . Heart attack Mother 82  . Hypertension Mother   . Heart disease Mother   . Obesity Mother   . Cancer Father        prostate  . Heart attack Sister 90  . Hypertension Other     ROS: Review of Systems  Constitutional: Negative for weight loss.  Gastrointestinal: Negative for nausea and vomiting.  Endo/Heme/Allergies:       Positive polyphagia Negative hypoglycemia   Psychiatric/Behavioral: Positive for depression. Negative for suicidal ideas. The patient does not have insomnia.     PHYSICAL EXAM: Pt in no acute distress  RECENT  LABS AND TESTS: BMET    Component Value Date/Time   NA 142 11/20/2018 0828   K 3.9 11/20/2018 0828   CL 101 11/20/2018 0828   CO2 25 11/20/2018 0828   GLUCOSE 98 11/20/2018 0828   GLUCOSE 86 03/13/2018 1152   BUN 10 11/20/2018 0828   CREATININE 0.73 11/20/2018 0828   CREATININE 0.72 02/03/2014 1648   CALCIUM 8.9 11/20/2018 0828   GFRNONAA 96 11/20/2018 0828   GFRAA 111 11/20/2018 0828   Lab Results  Component Value Date   HGBA1C 6.1 (H) 11/20/2018   HGBA1C 5.9 (H) 07/14/2018   HGBA1C 5.9 03/13/2018   HGBA1C 5.8 (H) 09/12/2017   HGBA1C 6.2 (H) 05/23/2017   Lab Results  Component Value Date   INSULIN 21.2 11/20/2018   INSULIN 15.4 07/14/2018   INSULIN 12.9 09/12/2017   INSULIN 31.1 (H) 05/23/2017   CBC    Component Value Date/Time   WBC 5.9 03/13/2018 1152   RBC 4.35 03/13/2018 1152   HGB 11.9 (L) 03/13/2018 1152   HGB 12.1 05/23/2017 1025   HCT 35.9 (L) 03/13/2018 1152   HCT 36.6 05/23/2017 1025   PLT 283.0 03/13/2018 1152   MCV 82.4 03/13/2018 1152   MCV 82 05/23/2017 1025   MCH 27.1 05/23/2017 1025   MCH 28.2 02/03/2014 1648   MCHC 33.2 03/13/2018 1152   RDW 15.0 03/13/2018 1152   RDW 14.9 05/23/2017 1025   LYMPHSABS 1.7 05/23/2017 1025   MONOABS 0.4 06/09/2014 0843   EOSABS 0.1 05/23/2017 1025   BASOSABS 0.0 05/23/2017 1025   Iron/TIBC/Ferritin/ %Sat No results found for: IRON, TIBC, FERRITIN, IRONPCTSAT Lipid Panel     Component Value Date/Time   CHOL 146 11/20/2018 0828   TRIG 89 11/20/2018 0828   HDL 35 (L) 11/20/2018 0828   CHOLHDL 3 03/13/2018 1152   VLDL 13.2 03/13/2018 1152   LDLCALC 93 11/20/2018 0828   Hepatic Function Panel     Component Value Date/Time   PROT 6.9 11/20/2018 0828   ALBUMIN 4.2 11/20/2018 0828   AST 9 11/20/2018 0828   ALT 11 11/20/2018 0828    ALKPHOS 80 11/20/2018 0828   BILITOT 0.3 11/20/2018 0828   BILIDIR 0.0 06/09/2014 0843   IBILI 0.1 (L) 02/03/2014 1648      Component Value Date/Time   TSH 2.10 03/13/2018 1152   TSH 1.710 05/23/2017 1025   TSH 1.26 03/15/2017 1140      I, Trixie Dredge, am acting as Location manager for Dennard Nip, MD I have reviewed the above documentation for accuracy and completeness, and I agree with the above. -Dennard Nip, MD

## 2019-01-01 ENCOUNTER — Other Ambulatory Visit: Payer: Self-pay

## 2019-01-01 ENCOUNTER — Encounter (INDEPENDENT_AMBULATORY_CARE_PROVIDER_SITE_OTHER): Payer: Self-pay | Admitting: Family Medicine

## 2019-01-01 ENCOUNTER — Ambulatory Visit (INDEPENDENT_AMBULATORY_CARE_PROVIDER_SITE_OTHER): Payer: BC Managed Care – PPO | Admitting: Family Medicine

## 2019-01-01 DIAGNOSIS — E1165 Type 2 diabetes mellitus with hyperglycemia: Secondary | ICD-10-CM

## 2019-01-01 DIAGNOSIS — E559 Vitamin D deficiency, unspecified: Secondary | ICD-10-CM | POA: Diagnosis not present

## 2019-01-01 DIAGNOSIS — Z6841 Body Mass Index (BMI) 40.0 and over, adult: Secondary | ICD-10-CM

## 2019-01-04 NOTE — Progress Notes (Signed)
Office: [phone]  /  Fax: [phone] TeleHealth Visit:  Brandi Lester has verbally consented to this TeleHealth visit today. The patient is located at home, the provider is located at the [facility] office. The participants in this visit include the listed provider and patient. The visit was conducted today via face time.  HPI:   Chief Complaint: OBESITY Brandi Lester is here to discuss her progress with her obesity treatment plan. She is on the Category 3 plan and is following her eating plan approximately 85 % of the time. She states she is walking and doing exercise videos for 10 minutes 2-3 times per week. 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 She is doing some stress eating during the day. She is occasionally skipping breakfast. She weighed in at her last office visit.  We were unable to weigh the patient today for this TeleHealth visit. She feels as if she has maintained her weight since her last visit. She has lost 14 lbs since starting treatment with [name].  Diabetes II with Hyperglycemia Brandi Lester has a diagnosis of diabetes type II. Brandi Lester denies feelings of hypoglycemia. She denies GI side effects of metformin. Last A1c was 6.1. She has been working on intensive lifestyle modifications including diet, exercise, and weight loss to help control her blood glucose levels.  Vitamin D Deficiency Brandi Lester has a diagnosis of vitamin D deficiency. She is currently taking prescription Vit D. She notes fatigue and denies nausea, vomiting or muscle weakness.  ASSESSMENT AND PLAN:  Type 2 diabetes mellitus with hyperglycemia, without long-term current use of insulin (HCC)  Vitamin D deficiency  Class 3 severe obesity with serious comorbidity and body mass index (BMI) of 45.0 to 49.9 in adult, unspecified obesity type ([place])  PLAN:  Diabetes II with Hyperglycemia Brandi Lester has been given extensive diabetes  education by myself today including ideal fasting and post-prandial blood glucose readings, individual ideal Hgb A1c goals and hypoglycemia prevention. We discussed the importance of good blood sugar control to decrease the likelihood of diabetic complications such as nephropathy, neuropathy, limb loss, blindness, coronary artery disease, and death. We discussed the importance of intensive lifestyle modification including diet, exercise and weight loss as the first line treatment for diabetes. Brandi Lester agrees to continue metformin, and we will repeat labs at the end of June. Brandi Lester agrees to follow up with our clinic in 2 weeks.  Vitamin D Deficiency Brandi Lester was informed that low vitamin D levels contributes to fatigue and are associated with obesity, breast, and colon cancer. Brandi Lester agrees to continue taking prescription Vit D @50 ,000 IU every week, no refill needed. She will follow up for routine testing of vitamin D, at least 2-3 times per year. She was informed of the risk of over-replacement of vitamin D and agrees to not increase her dose unless she discusses this with [name] first. Brandi Lester agrees to follow up with our clinic in 2 weeks.  Obesity Brandi Lester is currently in the action stage of change. As such, her goal is to continue with weight loss efforts She has agreed to follow the Category 3 plan Brandi Lester has been instructed to work up to a goal of 150 minutes of combined cardio and strengthening exercise per week for weight loss and overall health benefits. We discussed the following Behavioral Modification Strategies today: increasing lean protein intake, increasing vegetables, decrease eating out and work on meal planning and easy cooking plans, and planning  for success   Brandi Lester has agreed to follow up with our clinic in 2 weeks. She was informed of the importance of frequent follow up visits to maximize her success with intensive lifestyle modifications for her multiple health  conditions.  ALLERGIES: Allergies  Allergen Reactions   Penicillins Itching and Swelling    Has patient had a PCN reaction causing immediate rash, facial/tongue/throat swelling, SOB or lightheadedness with hypotension: yes Has patient had a PCN reaction causing severe rash involving mucus membranes or skin necrosis: no Has patient had a PCN reaction that required hospitalization no Has patient had a PCN reaction occurring within the last 10 years: yes If all of the above answers are "NO", then may proceed with Cephalosporin use.    Victoza [Liraglutide]     Vomiting    MEDICATIONS: Current Outpatient Medications on File Prior to Visit  Medication Sig Dispense Refill   acetaminophen (TYLENOL 8 HOUR) 650 MG CR tablet Take 1 tablet (650 mg total) every 8 (eight) hours as needed by mouth for pain.     amLODipine (NORVASC) 10 MG tablet Take 1 tablet (10 mg total) by mouth daily. 90 tablet 3   aspirin EC 81 MG tablet Take 1 tablet (81 mg total) by mouth daily.     azelastine (ASTELIN) 0.1 % nasal spray Place 2 sprays into both nostrils 2 (two) times daily. Use in each nostril as directed 90 mL 1   benzonatate (TESSALON) 100 MG capsule Take 1 capsule (100 mg total) by mouth 3 (three) times daily as needed for cough. 30 capsule 0   buPROPion (WELLBUTRIN SR) 200 MG 12 hr tablet Take 1 tablet (200 mg total) by mouth daily. 30 tablet 0   Cyanocobalamin (B-12) 1000 MCG CAPS Take by mouth daily.     doxycycline (VIBRA-TABS) 100 MG tablet Take 1 tablet (100 mg total) by mouth 2 (two) times daily. Can give caps or generic. 14 tablet 0   EPINEPHrine (EPIPEN 2-PAK) 0.3 mg/0.3 mL IJ SOAJ injection Use as directed for severe allergic reaction 2 Device 1   fexofenadine (ALLEGRA ALLERGY) 180 MG tablet Take 1 tablet (180 mg total) by mouth daily. 301 tablet 1   fluticasone (FLONASE) 50 MCG/ACT nasal spray Place 2 sprays into both nostrils daily. (Patient taking differently: Place 2 sprays into  both nostrils daily as needed for allergies. ) 16 g 11   glucose blood test strip OneTouch Ultra Test strips     hydrochlorothiazide (HYDRODIURIL) 25 MG tablet Take 1 tablet (25 mg total) by mouth daily. 90 tablet 1   ibuprofen (ADVIL,MOTRIN) 600 MG tablet Take 1 tablet (600 mg total) by mouth every 6 (six) hours as needed. 90 tablet 0   losartan (COZAAR) 100 MG tablet Take 1 tablet (100 mg total) by mouth at bedtime. 90 tablet 1   medroxyPROGESTERone (PROVERA) 10 MG tablet   6   metFORMIN (GLUCOPHAGE) 500 MG tablet Take 1 tablet (500 mg total) by mouth daily with breakfast. 60 tablet 0   montelukast (SINGULAIR) 10 MG tablet Take 1 tablet (10 mg total) by mouth at bedtime as needed. 90 tablet 1   Omega-3 Fatty Acids (FISH OIL PO) Take 1 capsule by mouth daily.     ONETOUCH DELICA LANCETS 42V MISC OneTouch Delica Lancets 33 gauge     pantoprazole (PROTONIX) 40 MG tablet TAKE 1 TABLET (40 MG TOTAL) BY MOUTH DAILY. 90 tablet 1   potassium chloride SA (K-DUR,KLOR-CON) 20 MEQ tablet Take 1 tablet (20 mEq total)  by mouth 3 (three) times daily. 270 tablet 1   Probiotic Product (PROBIOTIC-10 PO) Take by mouth daily.     TURMERIC PO Take 1 capsule by mouth daily.     Vitamin D, Ergocalciferol, (DRISDOL) 1.25 MG (50000 UT) CAPS capsule Take 1 capsule (50,000 Units total) by mouth every 7 (seven) days. 4 capsule 0   No current facility-administered medications on file prior to visit.     PAST MEDICAL HISTORY: Past Medical History:  Diagnosis Date   ADD (attention deficit disorder)    Allergy    allergic rhinitis   Anemia 07/25/2013   Angio-edema    Back pain    Costochondritis 02/20/2015   Diabetes mellitus    Type II   previously 3 years ago - no longer on meds    Dyslipidemia 07/25/2013   Fungus infection 10/13   Left great toe   GERD (gastroesophageal reflux disease)    Hypertension    IBS (irritable bowel syndrome)    Ingrown nail 10/13   right foot next to  the last toe   Insomnia 04/20/2013   Joint pain    Lactose intolerance    Leg edema    Obesity    Pedal edema 02/20/2015   Prediabetes    Preventative health care 09/25/2015   PVC (premature ventricular contraction)     PAST SURGICAL HISTORY: Past Surgical History:  Procedure Laterality Date   HYSTEROSCOPY N/A 10/04/2015   Procedure: HYSTEROSCOPY with Removal IUD;  Surgeon: Vanessa Kick, MD;  Location: [facility];  Service: Gynecology;  Laterality: N/A;   lasik     eye surgery   WISDOM TOOTH EXTRACTION      SOCIAL HISTORY: Social History   Tobacco Use   Smoking status: Former Smoker    Packs/day: 1.00    Years: 15.00    Pack years: 15.00    Last attempt to quit: 09/03/2001    Years since quitting: 17.3   Smokeless tobacco: Never Used  Substance Use Topics   Alcohol use: Yes    Comment: rare   Drug use: No    FAMILY HISTORY: Family History  Problem Relation Age of Onset   Heart attack Mother 45   Hypertension Mother    Heart disease Mother    Obesity Mother    Cancer Father        prostate   Heart attack Sister 38   Hypertension Other     ROS: Review of Systems  Constitutional: Positive for malaise/fatigue. Negative for weight loss.  Gastrointestinal: Negative for nausea and vomiting.  Musculoskeletal:       Negative muscle weakness  Endo/Heme/Allergies:       Negative hypoglycemia    PHYSICAL EXAM: Pt in no acute distress  RECENT LABS AND TESTS: BMET    Component Value Date/Time   NA 142 11/20/2018 0828   K 3.9 11/20/2018 0828   CL 101 11/20/2018 0828   CO2 25 11/20/2018 0828   GLUCOSE 98 11/20/2018 0828   GLUCOSE 86 03/13/2018 1152   BUN 10 11/20/2018 0828   CREATININE 0.73 11/20/2018 0828   CREATININE 0.72 02/03/2014 1648   CALCIUM 8.9 11/20/2018 0828   GFRNONAA 96 11/20/2018 0828   GFRAA 111 11/20/2018 0828   Lab Results  Component Value Date   HGBA1C 6.1 (H) 11/20/2018   HGBA1C 5.9 (H) 07/14/2018   HGBA1C 5.9  03/13/2018   HGBA1C 5.8 (H) 09/12/2017   HGBA1C 6.2 (H) 05/23/2017   Lab Results  Component Value Date  INSULIN 21.2 11/20/2018   INSULIN 15.4 07/14/2018   INSULIN 12.9 09/12/2017   INSULIN 31.1 (H) 05/23/2017   CBC    Component Value Date/Time   WBC 5.9 03/13/2018 1152   RBC 4.35 03/13/2018 1152   HGB 11.9 (L) 03/13/2018 1152   HGB 12.1 05/23/2017 1025   HCT 35.9 (L) 03/13/2018 1152   HCT 36.6 05/23/2017 1025   PLT 283.0 03/13/2018 1152   MCV 82.4 03/13/2018 1152   MCV 82 05/23/2017 1025   MCH 27.1 05/23/2017 1025   MCH 28.2 02/03/2014 1648   MCHC 33.2 03/13/2018 1152   RDW 15.0 03/13/2018 1152   RDW 14.9 05/23/2017 1025   LYMPHSABS 1.7 05/23/2017 1025   MONOABS 0.4 06/09/2014 0843   EOSABS 0.1 05/23/2017 1025   BASOSABS 0.0 05/23/2017 1025   Iron/TIBC/Ferritin/ %Sat No results found for: IRON, TIBC, FERRITIN, IRONPCTSAT Lipid Panel     Component Value Date/Time   CHOL 146 11/20/2018 0828   TRIG 89 11/20/2018 0828   HDL 35 (L) 11/20/2018 0828   CHOLHDL 3 03/13/2018 1152   VLDL 13.2 03/13/2018 1152   LDLCALC 93 11/20/2018 0828   Hepatic Function Panel     Component Value Date/Time   PROT 6.9 11/20/2018 0828   ALBUMIN 4.2 11/20/2018 0828   AST 9 11/20/2018 0828   ALT 11 11/20/2018 0828   ALKPHOS 80 11/20/2018 0828   BILITOT 0.3 11/20/2018 0828   BILIDIR 0.0 06/09/2014 0843   IBILI 0.1 (L) 02/03/2014 1648      Component Value Date/Time   TSH 2.10 03/13/2018 1152   TSH 1.710 05/23/2017 1025   TSH 1.26 03/15/2017 1140      I, Trixie Dredge, am acting as Location manager for Ilene Qua, MD  I have reviewed the above documentation for accuracy and completeness, and I agree with the above. - Ilene Qua, MD

## 2019-01-05 MED FILL — MEDROXYPROGESTERONE 10 MG T: 10 | 90 days supply | Qty: 90 | Fill #2

## 2019-01-05 MED FILL — AMLODIPINE BESYLATE 10 MG T: 10 | 30 days supply | Qty: 30 | Fill #2

## 2019-01-05 MED FILL — LOSARTAN POTASSIUM 100 MG T: 100 | 30 days supply | Qty: 30 | Fill #2

## 2019-01-05 MED FILL — HYDROCHLOROTHIAZIDE 25 MG T: 25 | 30 days supply | Qty: 30 | Fill #2

## 2019-01-15 ENCOUNTER — Other Ambulatory Visit: Payer: Self-pay

## 2019-01-15 ENCOUNTER — Ambulatory Visit (INDEPENDENT_AMBULATORY_CARE_PROVIDER_SITE_OTHER): Payer: BC Managed Care – PPO | Admitting: Family Medicine

## 2019-01-15 ENCOUNTER — Encounter (INDEPENDENT_AMBULATORY_CARE_PROVIDER_SITE_OTHER): Payer: Self-pay | Admitting: Family Medicine

## 2019-01-15 DIAGNOSIS — E559 Vitamin D deficiency, unspecified: Secondary | ICD-10-CM | POA: Diagnosis not present

## 2019-01-15 DIAGNOSIS — F3289 Other specified depressive episodes: Secondary | ICD-10-CM | POA: Diagnosis not present

## 2019-01-15 DIAGNOSIS — E119 Type 2 diabetes mellitus without complications: Secondary | ICD-10-CM

## 2019-01-15 DIAGNOSIS — Z6841 Body Mass Index (BMI) 40.0 and over, adult: Secondary | ICD-10-CM

## 2019-01-15 MED ORDER — METFORMIN HCL 500 MG PO TABS: 500.0000 mg | ORAL_TABLET | Freq: Every day | ORAL | 0 refills | Status: DC

## 2019-01-15 MED ORDER — VITAMIN D (ERGOCALCIFEROL) 1.25 MG (50000 UNIT) PO CAPS: 50000.0000 [IU] | ORAL_CAPSULE | ORAL | 0 refills | Status: DC

## 2019-01-15 MED ORDER — BUPROPION HCL ER (SR) 200 MG PO TB12: 200.0000 mg | ORAL_TABLET | Freq: Every day | ORAL | 0 refills | Status: DC

## 2019-01-15 MED FILL — VIT D2 1.25 MG (50,000 UNIT: 1.25 MG | 28 days supply | Qty: 4 | Fill #0

## 2019-01-15 MED FILL — BUPROPION HCL SR 200 MG TAB: 200 | 30 days supply | Qty: 30 | Fill #0

## 2019-01-15 NOTE — Progress Notes (Signed)
Office: [phone]  /  Fax: [phone] TeleHealth Visit:  Brandi Lester has verbally consented to this TeleHealth visit today. The patient is located at home, the provider is located at the [facility] office. The participants in this visit include the listed provider and patient. The visit was conducted today via face time.  HPI:   Chief Complaint: OBESITY Brandi Lester is here to discuss her progress with her obesity treatment plan. She is on the Category 3 plan and is following her eating plan approximately 85 % of the time. She states she is walking 2 miles 7 times per week. Brandi Lester found setting a schedule to make time for all food to be easier. 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 Her weight is of 318 lbs at home. She states her blood pressure is 120/80. We were unable to weigh the patient today for this TeleHealth visit. She feels as if she has lost 3 lbs since her last visit. She has lost 14-17 lbs since starting treatment with [name].  Vitamin D Deficiency Brandi Lester has a diagnosis of vitamin D deficiency. She is currently taking prescription Vit D. She notes fatigue and denies nausea, vomiting or muscle weakness.  Diabetes II with Hyperglycemia Brandi Lester has a diagnosis of diabetes type II. Brandi Lester denies GI side effects of metformin. She is tolerating metformin 1.5 pills daily. Last A1c was 6.1. She denies hypoglycemia. She has been working on intensive lifestyle modifications including diet, exercise, and weight loss to help control her blood glucose levels.  Depression with Emotional Eating Behaviors Brandi Lester notes carbohydrate cravings better controlled with Wellbutrin. Brandi Lester struggles with emotional eating and using food for comfort to the extent that it is negatively impacting her health. She often snacks when she is not hungry. Brandi Lester sometimes feels she is out of control and then feels guilty that she made poor food  choices. She has been working on behavior modification techniques to help reduce her emotional eating and has been somewhat successful. She shows no sign of suicidal or homicidal ideations.  Depression screen Brandi Lester 2/9 05/23/2017 07/03/2016 04/13/2016  Decreased Interest 2 0 0  Down, Depressed, Hopeless 1 0 0  PHQ - 2 Score 3 0 0  Altered sleeping 1 - -  Tired, decreased energy 1 - -  Change in appetite 1 - -  Feeling bad or failure about yourself  0 - -  Trouble concentrating 1 - -  Moving slowly or fidgety/restless 0 - -  Suicidal thoughts 0 - -  PHQ-9 Score 7 - -  Difficult doing work/chores Not difficult at all - -    ASSESSMENT AND PLAN:  Vitamin D deficiency - Plan: Vitamin D, Ergocalciferol, (DRISDOL) 1.25 MG (50000 UT) CAPS capsule  Type 2 diabetes mellitus without complication, without long-term current use of insulin (HCC) - Plan: metFORMIN (GLUCOPHAGE) 500 MG tablet  Other depression - with emotional eating - Plan: buPROPion (WELLBUTRIN SR) 200 MG 12 hr tablet  Class 3 severe obesity with serious comorbidity and body mass index (BMI) of 45.0 to 49.9 in adult, unspecified obesity type (HCC)  PLAN:  Vitamin D Deficiency Brandi Lester was informed that low vitamin D levels contributes to fatigue and are associated with obesity, breast, and colon cancer. Brandi Lester agrees to continue taking prescription Vit D @50 ,000 IU every week #4 and we will refill for 1 month. She will follow up for routine testing of vitamin D, at least 2-3 times per  year. She was informed of the risk of over-replacement of vitamin D and agrees to not increase her dose unless she discusses this with [name] first. Brandi Lester agrees to follow up with our clinic in 2 weeks.  Diabetes II with Hyperglycemia Brandi Lester has been given extensive diabetes education by myself today including ideal fasting and post-prandial blood glucose readings, individual ideal Hgb A1c goals and hypoglycemia prevention. We discussed the importance of  good blood sugar control to decrease the likelihood of diabetic complications such as nephropathy, neuropathy, limb loss, blindness, coronary artery disease, and death. We discussed the importance of intensive lifestyle modification including diet, exercise and weight loss as the first line treatment for diabetes. Brandi Lester agrees to continue taking metformin 500 mg PO q AM #30 and we will refill for 1 month. [name] agrees to follow up with our clinic in 2 weeks.  Depression with Emotional Eating Behaviors We discussed behavior modification techniques today to help Brandi Lester deal with her emotional eating and depression. Brandi Lester agrees to continue taking Wellbutrin SR 200 mg PO daily #30 and we will refill for 1 month.  Obesity Brandi Lester is currently in the action stage of change. As such, her goal is to continue with weight loss efforts She has agreed to follow the Category 3 plan Brandi Lester has been instructed to work up to a goal of 150 minutes of combined cardio and strengthening exercise per week for weight loss and overall health benefits. We discussed the following Behavioral Modification Strategies today: increasing lean protein intake, increasing vegetables and work on meal planning and easy cooking plans, keeping healthy foods in the home, and planning for success Brandi Lester will continue with current activity and meal plan.  Brandi Lester has agreed to follow up with our clinic in 2 weeks. She was informed of the importance of frequent follow up visits to maximize her success with intensive lifestyle modifications for her multiple health conditions.  ALLERGIES: Allergies  Allergen Reactions   Penicillins Itching and Swelling    Has patient had a PCN reaction causing immediate rash, facial/tongue/throat swelling, SOB or lightheadedness with hypotension: yes Has patient had a PCN reaction causing severe rash involving mucus membranes or skin necrosis: no Has patient had a PCN reaction that required  hospitalization no Has patient had a PCN reaction occurring within the last 10 years: yes If all of the above answers are "NO", then may proceed with Cephalosporin use.    Victoza [Liraglutide]     Vomiting    MEDICATIONS: Current Outpatient Medications on File Prior to Visit  Medication Sig Dispense Refill   acetaminophen (TYLENOL 8 HOUR) 650 MG CR tablet Take 1 tablet (650 mg total) every 8 (eight) hours as needed by mouth for pain.     amLODipine (NORVASC) 10 MG tablet Take 1 tablet (10 mg total) by mouth daily. 90 tablet 3   aspirin EC 81 MG tablet Take 1 tablet (81 mg total) by mouth daily.     azelastine (ASTELIN) 0.1 % nasal spray Place 2 sprays into both nostrils 2 (two) times daily. Use in each nostril as directed 90 mL 1   benzonatate (TESSALON) 100 MG capsule Take 1 capsule (100 mg total) by mouth 3 (three) times daily as needed for cough. 30 capsule 0   Cyanocobalamin (B-12) 1000 MCG CAPS Take by mouth daily.     doxycycline (VIBRA-TABS) 100 MG tablet Take 1 tablet (100 mg total) by mouth 2 (two) times daily. Can give caps or generic. 14 tablet 0  EPINEPHrine (EPIPEN 2-PAK) 0.3 mg/0.3 mL IJ SOAJ injection Use as directed for severe allergic reaction 2 Device 1   fexofenadine (ALLEGRA ALLERGY) 180 MG tablet Take 1 tablet (180 mg total) by mouth daily. 301 tablet 1   fluticasone (FLONASE) 50 MCG/ACT nasal spray Place 2 sprays into both nostrils daily. (Patient taking differently: Place 2 sprays into both nostrils daily as needed for allergies. ) 16 g 11   glucose blood test strip OneTouch Ultra Test strips     hydrochlorothiazide (HYDRODIURIL) 25 MG tablet Take 1 tablet (25 mg total) by mouth daily. 90 tablet 1   ibuprofen (ADVIL,MOTRIN) 600 MG tablet Take 1 tablet (600 mg total) by mouth every 6 (six) hours as needed. 90 tablet 0   losartan (COZAAR) 100 MG tablet Take 1 tablet (100 mg total) by mouth at bedtime. 90 tablet 1   medroxyPROGESTERone (PROVERA) 10 MG  tablet   6   montelukast (SINGULAIR) 10 MG tablet Take 1 tablet (10 mg total) by mouth at bedtime as needed. 90 tablet 1   Omega-3 Fatty Acids (FISH OIL PO) Take 1 capsule by mouth daily.     ONETOUCH DELICA LANCETS 00X MISC OneTouch Delica Lancets 33 gauge     pantoprazole (PROTONIX) 40 MG tablet TAKE 1 TABLET (40 MG TOTAL) BY MOUTH DAILY. 90 tablet 1   potassium chloride SA (K-DUR,KLOR-CON) 20 MEQ tablet Take 1 tablet (20 mEq total) by mouth 3 (three) times daily. 270 tablet 1   Probiotic Product (PROBIOTIC-10 PO) Take by mouth daily.     TURMERIC PO Take 1 capsule by mouth daily.     No current facility-administered medications on file prior to visit.     PAST MEDICAL HISTORY: Past Medical History:  Diagnosis Date   ADD (attention deficit disorder)    Allergy    allergic rhinitis   Anemia 07/25/2013   Angio-edema    Back pain    Costochondritis 02/20/2015   Diabetes mellitus    Type II   previously 3 years ago - no longer on meds    Dyslipidemia 07/25/2013   Fungus infection 10/13   Left great toe   GERD (gastroesophageal reflux disease)    Hypertension    IBS (irritable bowel syndrome)    Ingrown nail 10/13   right foot next to the last toe   Insomnia 04/20/2013   Joint pain    Lactose intolerance    Leg edema    Obesity    Pedal edema 02/20/2015   Prediabetes    Preventative health care 09/25/2015   PVC (premature ventricular contraction)     PAST SURGICAL HISTORY: Past Surgical History:  Procedure Laterality Date   HYSTEROSCOPY N/A 10/04/2015   Procedure: HYSTEROSCOPY with Removal IUD;  Surgeon: Vanessa Kick, MD;  Location: [facility];  Service: Gynecology;  Laterality: N/A;   lasik     eye surgery   WISDOM TOOTH EXTRACTION      SOCIAL HISTORY: Social History   Tobacco Use   Smoking status: Former Smoker    Packs/day: 1.00    Years: 15.00    Pack years: 15.00    Last attempt to quit: 09/03/2001    Years since quitting: 17.3    Smokeless tobacco: Never Used  Substance Use Topics   Alcohol use: Yes    Comment: rare   Drug use: No    FAMILY HISTORY: Family History  Problem Relation Age of Onset   Heart attack Mother 17   Hypertension Mother  Heart disease Mother    Obesity Mother    Cancer Father        prostate   Heart attack Sister 79   Hypertension Other     ROS: Review of Systems  Constitutional: Positive for malaise/fatigue and weight loss.  Gastrointestinal: Negative for nausea and vomiting.  Musculoskeletal:       Negative muscle weakness  Endo/Heme/Allergies:       Negative hypoglycemia  Psychiatric/Behavioral: Positive for depression. Negative for suicidal ideas.    PHYSICAL EXAM: Pt in no acute distress  RECENT LABS AND TESTS: BMET    Component Value Date/Time   NA 142 11/20/2018 0828   K 3.9 11/20/2018 0828   CL 101 11/20/2018 0828   CO2 25 11/20/2018 0828   GLUCOSE 98 11/20/2018 0828   GLUCOSE 86 03/13/2018 1152   BUN 10 11/20/2018 0828   CREATININE 0.73 11/20/2018 0828   CREATININE 0.72 02/03/2014 1648   CALCIUM 8.9 11/20/2018 0828   GFRNONAA 96 11/20/2018 0828   GFRAA 111 11/20/2018 0828   Lab Results  Component Value Date   HGBA1C 6.1 (H) 11/20/2018   HGBA1C 5.9 (H) 07/14/2018   HGBA1C 5.9 03/13/2018   HGBA1C 5.8 (H) 09/12/2017   HGBA1C 6.2 (H) 05/23/2017   Lab Results  Component Value Date   INSULIN 21.2 11/20/2018   INSULIN 15.4 07/14/2018   INSULIN 12.9 09/12/2017   INSULIN 31.1 (H) 05/23/2017   CBC    Component Value Date/Time   WBC 5.9 03/13/2018 1152   RBC 4.35 03/13/2018 1152   HGB 11.9 (L) 03/13/2018 1152   HGB 12.1 05/23/2017 1025   HCT 35.9 (L) 03/13/2018 1152   HCT 36.6 05/23/2017 1025   PLT 283.0 03/13/2018 1152   MCV 82.4 03/13/2018 1152   MCV 82 05/23/2017 1025   MCH 27.1 05/23/2017 1025   MCH 28.2 02/03/2014 1648   MCHC 33.2 03/13/2018 1152   RDW 15.0 03/13/2018 1152   RDW 14.9 05/23/2017 1025   LYMPHSABS 1.7 05/23/2017  1025   MONOABS 0.4 06/09/2014 0843   EOSABS 0.1 05/23/2017 1025   BASOSABS 0.0 05/23/2017 1025   Iron/TIBC/Ferritin/ %Sat No results found for: IRON, TIBC, FERRITIN, IRONPCTSAT Lipid Panel     Component Value Date/Time   CHOL 146 11/20/2018 0828   TRIG 89 11/20/2018 0828   HDL 35 (L) 11/20/2018 0828   CHOLHDL 3 03/13/2018 1152   VLDL 13.2 03/13/2018 1152   LDLCALC 93 11/20/2018 0828   Hepatic Function Panel     Component Value Date/Time   PROT 6.9 11/20/2018 0828   ALBUMIN 4.2 11/20/2018 0828   AST 9 11/20/2018 0828   ALT 11 11/20/2018 0828   ALKPHOS 80 11/20/2018 0828   BILITOT 0.3 11/20/2018 0828   BILIDIR 0.0 06/09/2014 0843   IBILI 0.1 (L) 02/03/2014 1648      Component Value Date/Time   TSH 2.10 03/13/2018 1152   TSH 1.710 05/23/2017 1025   TSH 1.26 03/15/2017 1140      I, Trixie Dredge, am acting as Location manager for Ilene Qua, MD  I have reviewed the above documentation for accuracy and completeness, and I agree with the above. - Ilene Qua, MD

## 2019-01-16 ENCOUNTER — Encounter (INDEPENDENT_AMBULATORY_CARE_PROVIDER_SITE_OTHER): Payer: Self-pay | Admitting: Family Medicine

## 2019-01-16 ENCOUNTER — Encounter: Payer: Self-pay | Admitting: Family Medicine

## 2019-01-16 ENCOUNTER — Encounter: Payer: Self-pay | Admitting: Internal Medicine

## 2019-01-16 ENCOUNTER — Other Ambulatory Visit: Payer: Self-pay

## 2019-01-16 ENCOUNTER — Ambulatory Visit (INDEPENDENT_AMBULATORY_CARE_PROVIDER_SITE_OTHER): Payer: BC Managed Care – PPO | Admitting: Internal Medicine

## 2019-01-16 DIAGNOSIS — S86891A Other injury of other muscle(s) and tendon(s) at lower leg level, right leg, initial encounter: Secondary | ICD-10-CM

## 2019-01-16 NOTE — Progress Notes (Signed)
Subjective:    Patient ID: Brandi Lester, female    DOB: 12-16-1965, 53 y.o.   MRN: [id number]  DOS:  01/16/2019 Type of visit - description: Virtual Visit via Video Note  I connected with@ on 01/16/19 at  1:00 PM EDT by a video enabled telemedicine application and verified that I am speaking with the correct person using two identifiers.   THIS ENCOUNTER IS A VIRTUAL VISIT DUE TO COVID-19 - PATIENT WAS NOT SEEN IN THE OFFICE. PATIENT HAS CONSENTED TO VIRTUAL VISIT / TELEMEDICINE VISIT   Location of patient: home  Location of provider: office  I discussed the limitations of evaluation and management by telemedicine and the availability of in person appointments. The patient expressed understanding and agreed to proceed.  History of Present Illness: Acute visit Started a walking program approximately 2 weeks ago, walking 2 miles a day. Yesterday, for the first time she experienced pain at the right pretibial area. Today, she started her regular walk,10 minutes later she experienced the same pain.  She is concerned about a blood clot.  Review of Systems She denies any pain or swelling at the calf on either side. No chest pain   Past Medical History:  Diagnosis Date  . ADD (attention deficit disorder)   . Allergy    allergic rhinitis  . Anemia 07/25/2013  . Angio-edema   . Back pain   . Costochondritis 02/20/2015  . Diabetes mellitus    Type II   previously 3 years ago - no longer on meds   . Dyslipidemia 07/25/2013  . Fungus infection 10/13   Left great toe  . GERD (gastroesophageal reflux disease)   . Hypertension   . IBS (irritable bowel syndrome)   . Ingrown nail 10/13   right foot next to the last toe  . Insomnia 04/20/2013  . Joint pain   . Lactose intolerance   . Leg edema   . Obesity   . Pedal edema 02/20/2015  . Prediabetes   . Preventative health care 09/25/2015  . PVC (premature ventricular contraction)     Past Surgical History:  Procedure Laterality  Date  . HYSTEROSCOPY N/A 10/04/2015   Procedure: HYSTEROSCOPY with Removal IUD;  Surgeon: Vanessa Kick, MD;  Location: [facility];  Service: Gynecology;  Laterality: N/A;  . lasik     eye surgery  . WISDOM TOOTH EXTRACTION      Social History   Socioeconomic History  . Marital status: Single    Spouse name: Not on file  . Number of children: Not on file  . Years of education: Not on file  . Highest education level: Not on file  Occupational History  . Occupation: Pharmacist, hospital, [employer]  Social Needs  . Financial resource strain: Not on file  . Food insecurity:    Worry: Not on file    Inability: Not on file  . Transportation needs:    Medical: Not on file    Non-medical: Not on file  Tobacco Use  . Smoking status: Former Smoker    Packs/day: 1.00    Years: 15.00    Pack years: 15.00    Last attempt to quit: 09/03/2001    Years since quitting: 17.3  . Smokeless tobacco: Never Used  Substance and Sexual Activity  . Alcohol use: Yes    Comment: rare  . Drug use: No  . Sexual activity: Not on file  Lifestyle  . Physical activity:    Days per week: Not on file  Minutes per session: Not on file  . Stress: Not on file  Relationships  . Social connections:    Talks on phone: Not on file    Gets together: Not on file    Attends religious service: Not on file    Active member of club or organization: Not on file    Attends meetings of clubs or organizations: Not on file    Relationship status: Not on file  . Intimate partner violence:    Fear of current or ex partner: Not on file    Emotionally abused: Not on file    Physically abused: Not on file    Forced sexual activity: Not on file  Other Topics Concern  . Not on file  Social History Narrative  . Not on file      Allergies as of 01/16/2019      Reactions   Penicillins Itching, Swelling   Has patient had a PCN reaction causing immediate rash, facial/tongue/throat swelling, SOB or lightheadedness with hypotension:  yes Has patient had a PCN reaction causing severe rash involving mucus membranes or skin necrosis: no Has patient had a PCN reaction that required hospitalization no Has patient had a PCN reaction occurring within the last 10 years: yes If all of the above answers are "NO", then may proceed with Cephalosporin use.   Victoza [liraglutide]    Vomiting      Medication List       Accurate as of Jan 16, 2019 12:56 PM. If you have any questions, ask your nurse or doctor.        acetaminophen 650 MG CR tablet Commonly known as:  Tylenol 8 Hour Take 1 tablet (650 mg total) every 8 (eight) hours as needed by mouth for pain.   amLODipine 10 MG tablet Commonly known as:  NORVASC Take 1 tablet (10 mg total) by mouth daily.   aspirin EC 81 MG tablet Take 1 tablet (81 mg total) by mouth daily.   azelastine 0.1 % nasal spray Commonly known as:  ASTELIN Place 2 sprays into both nostrils 2 (two) times daily. Use in each nostril as directed   B-12 1000 MCG Caps Take by mouth daily.   benzonatate 100 MG capsule Commonly known as:  TESSALON Take 1 capsule (100 mg total) by mouth 3 (three) times daily as needed for cough.   buPROPion 200 MG 12 hr tablet Commonly known as:  Wellbutrin SR Take 1 tablet (200 mg total) by mouth daily.   doxycycline 100 MG tablet Commonly known as:  VIBRA-TABS Take 1 tablet (100 mg total) by mouth 2 (two) times daily. Can give caps or generic.   EPINEPHrine 0.3 mg/0.3 mL Soaj injection Commonly known as:  EpiPen 2-Pak Use as directed for severe allergic reaction   fexofenadine 180 MG tablet Commonly known as:  Allegra Allergy Take 1 tablet (180 mg total) by mouth daily.   FISH OIL PO Take 1 capsule by mouth daily.   fluticasone 50 MCG/ACT nasal spray Commonly known as:  FLONASE Place 2 sprays into both nostrils daily. What changed:    when to take this  reasons to take this   glucose blood test strip OneTouch Ultra Test strips    hydrochlorothiazide 25 MG tablet Commonly known as:  HYDRODIURIL Take 1 tablet (25 mg total) by mouth daily.   ibuprofen 600 MG tablet Commonly known as:  ADVIL Take 1 tablet (600 mg total) by mouth every 6 (six) hours as needed.   losartan  100 MG tablet Commonly known as:  COZAAR Take 1 tablet (100 mg total) by mouth at bedtime.   medroxyPROGESTERone 10 MG tablet Commonly known as:  PROVERA   metFORMIN 500 MG tablet Commonly known as:  GLUCOPHAGE Take 1 tablet (500 mg total) by mouth daily with breakfast.   montelukast 10 MG tablet Commonly known as:  SINGULAIR Take 1 tablet (10 mg total) by mouth at bedtime as needed.   OneTouch Delica Lancets 44B Misc OneTouch Delica Lancets 33 gauge   pantoprazole 40 MG tablet Commonly known as:  PROTONIX TAKE 1 TABLET (40 MG TOTAL) BY MOUTH DAILY.   potassium chloride SA 20 MEQ tablet Commonly known as:  K-DUR Take 1 tablet (20 mEq total) by mouth 3 (three) times daily.   PROBIOTIC-10 PO Take by mouth daily.   TURMERIC PO Take 1 capsule by mouth daily.   Vitamin D (Ergocalciferol) 1.25 MG (50000 UT) Caps capsule Commonly known as:  DRISDOL Take 1 capsule (50,000 Units total) by mouth every 7 (seven) days.           Objective:   Physical Exam There were no vitals taken for this visit. This is a virtual video visit.  She is alert oriented x3, no apparent distress    Assessment    53 year old female with multiple medical problems including hypertension, morbid obesity, DM, high cholesterol, presents with:   Shin splint: Strongly suspect pain is related to shin splint, given location and recent onset of exercise. Plan:  avoid any physical activity that triggered the pain to let that area settle down. Hopefully she will be able to stay active (she is thinking about yoga and exercise different from walking) Okay to take ibuprofen with GI precautions which were discussed with the patient Icing the area as needed Go back  to a gradual walking program (starting 10 minutes a day and gradually increase) in 2 weeks from today. Visit the [facility] website for more information ref shin splint Call if not better, definitely call if she has pain or swelling at the actual calf.

## 2019-01-19 MED FILL — POTASSIUM CHLORIDE CRYS ER: 20 | 30 days supply | Qty: 90 | Fill #2

## 2019-01-19 NOTE — Telephone Encounter (Signed)
Please advise 

## 2019-01-29 ENCOUNTER — Ambulatory Visit (INDEPENDENT_AMBULATORY_CARE_PROVIDER_SITE_OTHER): Payer: BC Managed Care – PPO | Admitting: Family Medicine

## 2019-01-29 ENCOUNTER — Other Ambulatory Visit: Payer: Self-pay

## 2019-01-29 DIAGNOSIS — I10 Essential (primary) hypertension: Secondary | ICD-10-CM

## 2019-01-29 DIAGNOSIS — E1165 Type 2 diabetes mellitus with hyperglycemia: Secondary | ICD-10-CM | POA: Diagnosis not present

## 2019-01-29 DIAGNOSIS — Z6841 Body Mass Index (BMI) 40.0 and over, adult: Secondary | ICD-10-CM | POA: Diagnosis not present

## 2019-02-01 NOTE — Progress Notes (Signed)
Office: [phone]  /  Fax: [phone] TeleHealth Visit:  Brandi Lester has verbally consented to this TeleHealth visit today. The patient is located at home, the provider is located at the [facility] office. The participants in this visit include the listed provider and patient. The visit was conducted today via face time.  HPI:   Chief Complaint: OBESITY Brandi Lester is here to discuss her progress with her obesity treatment plan. She is on the Category 3 plan and is following her eating plan approximately 80 % of the time. She states she is doing yard work. Brandi Lester has developed shin splints within the past 2 weeks. She has had to dial back exercise. She has had 2 indulgent eatings of bbq chips and ice cream. She has been trying to eat all of the food on the meal plan for each meal.  We were unable to weigh the patient today for this TeleHealth visit. She feels as if she has lost 1 lb since her last visit. She has lost 14-17 lbs since starting treatment with [name].  Diabetes II with Hyperglycemia Brandi Lester has a diagnosis of diabetes type II. Brandi Lester is only on metformin and she denies GI side effects. Last A1c was 6.1. She denies hypoglycemia. She has been working on intensive lifestyle modifications including diet, exercise, and weight loss to help control her blood glucose levels.  Hypertension Brandi Lester is a 53 y.o. female with hypertension. Brandi Lester's blood pressure was previously controlled. She denies chest pain, chest pressure, or headaches. She is working on weight loss to help control her blood pressure with the goal of decreasing her risk of heart attack and stroke.   ASSESSMENT AND PLAN:  Type 2 diabetes mellitus with hyperglycemia, without long-term current use of insulin (HCC)  Essential hypertension  Class 3 severe obesity with serious comorbidity and body mass index (BMI) of 45.0 to 49.9 in adult, unspecified obesity type ([place])  PLAN:  Diabetes II  with Hyperglycemia Brandi Lester has been given extensive diabetes education by myself today including ideal fasting and post-prandial blood glucose readings, individual ideal Hgb A1c goals and hypoglycemia prevention. We discussed the importance of good blood sugar control to decrease the likelihood of diabetic complications such as nephropathy, neuropathy, limb loss, blindness, coronary artery disease, and death. We discussed the importance of intensive lifestyle modification including diet, exercise and weight loss as the first line treatment for diabetes. Jackelin agrees to continue taking metformin, and she agrees to follow up with our clinic in 2 weeks.  Hypertension We discussed sodium restriction, working on healthy weight loss, and a regular exercise program as the means to achieve improved blood pressure control. Brandi Lester agreed with this plan and agreed to follow up as directed. We will continue to monitor her blood pressure as well as her progress with the above lifestyle modifications. Brandi Lester agrees to continue her medications, no refill needed and will watch for signs of hypotension as she continues her lifestyle modifications. Brandi Lester agrees to follow up with our clinic in 2 weeks.  Obesity Brandi Lester is currently in the action stage of change. As such, her goal is to continue with weight loss efforts She has agreed to follow the Category 3 plan Brandi Lester has been instructed to work up to a goal of 150 minutes of combined cardio and strengthening exercise per week for weight loss and overall health benefits. We discussed the following Behavioral Modification Strategies today: increasing lean protein intake, increasing vegetables, work on meal planning and easy  cooking plans, avoiding temptations, keeping healthy foods in the home, better snacking choices, and planning for success   Brandi Lester has agreed to follow up with our clinic in 2 weeks. She was informed of the importance of frequent follow up  visits to maximize her success with intensive lifestyle modifications for her multiple health conditions.  ALLERGIES: Allergies  Allergen Reactions  . Penicillins Itching and Swelling    Has patient had a PCN reaction causing immediate rash, facial/tongue/throat swelling, SOB or lightheadedness with hypotension: yes Has patient had a PCN reaction causing severe rash involving mucus membranes or skin necrosis: no Has patient had a PCN reaction that required hospitalization no Has patient had a PCN reaction occurring within the last 10 years: yes If all of the above answers are "NO", then may proceed with Cephalosporin use.   [name] [Liraglutide]     Vomiting    MEDICATIONS: Current Outpatient Medications on File Prior to Visit  Medication Sig Dispense Refill  . acetaminophen (TYLENOL 8 HOUR) 650 MG CR tablet Take 1 tablet (650 mg total) every 8 (eight) hours as needed by mouth for pain.    [name] amLODipine (NORVASC) 10 MG tablet Take 1 tablet (10 mg total) by mouth daily. 90 tablet 3  . azelastine (ASTELIN) 0.1 % nasal spray Place 2 sprays into both nostrils 2 (two) times daily. Use in each nostril as directed 90 mL 1  . buPROPion (WELLBUTRIN SR) 200 MG 12 hr tablet Take 1 tablet (200 mg total) by mouth daily. 30 tablet 0  . EPINEPHrine (EPIPEN 2-PAK) 0.3 mg/0.3 mL IJ SOAJ injection Use as directed for severe allergic reaction 2 Device 1  . fexofenadine (ALLEGRA ALLERGY) 180 MG tablet Take 1 tablet (180 mg total) by mouth daily. 301 tablet 1  . fluticasone (FLONASE) 50 MCG/ACT nasal spray Place 2 sprays into both nostrils daily. (Patient taking differently: Place 2 sprays into both nostrils daily as needed for allergies. ) 16 g 11  . glucose blood test strip OneTouch Ultra Test strips    . hydrochlorothiazide (HYDRODIURIL) 25 MG tablet Take 1 tablet (25 mg total) by mouth daily. 90 tablet 1  . ibuprofen (ADVIL,MOTRIN) 600 MG tablet Take 1 tablet (600 mg total) by mouth every 6 (six) hours  as needed. 90 tablet 0  . losartan (COZAAR) 100 MG tablet Take 1 tablet (100 mg total) by mouth at bedtime. 90 tablet 1  . medroxyPROGESTERone (PROVERA) 10 MG tablet   6  . metFORMIN (GLUCOPHAGE) 500 MG tablet Take 1 tablet (500 mg total) by mouth daily with breakfast. 60 tablet 0  . montelukast (SINGULAIR) 10 MG tablet Take 1 tablet (10 mg total) by mouth at bedtime as needed. (Patient not taking: Reported on 01/16/2019) 90 tablet 1  . Omega-3 Fatty Acids (FISH OIL PO) Take 1 capsule by mouth daily.    [name] DELICA LANCETS 51W MISC OneTouch Delica Lancets 33 gauge    . pantoprazole (PROTONIX) 40 MG tablet TAKE 1 TABLET (40 MG TOTAL) BY MOUTH DAILY. 90 tablet 1  . potassium chloride SA (K-DUR,KLOR-CON) 20 MEQ tablet Take 1 tablet (20 mEq total) by mouth 3 (three) times daily. 270 tablet 1  . Probiotic Product (PROBIOTIC-10 PO) Take by mouth daily.    . TURMERIC PO Take 1 capsule by mouth daily.    . Vitamin D, Ergocalciferol, (DRISDOL) 1.25 MG (50000 UT) CAPS capsule Take 1 capsule (50,000 Units total) by mouth every 7 (seven) days. 4 capsule 0   No  current facility-administered medications on file prior to visit.     PAST MEDICAL HISTORY: Past Medical History:  Diagnosis Date  . ADD (attention deficit disorder)   . Allergy    allergic rhinitis  . Anemia 07/25/2013  . Angio-edema   . Back pain   . Costochondritis 02/20/2015  . Diabetes mellitus    Type II   previously 3 years ago - no longer on meds   . Dyslipidemia 07/25/2013  . Fungus infection 10/13   Left great toe  . GERD (gastroesophageal reflux disease)   . Hypertension   . IBS (irritable bowel syndrome)   . Ingrown nail 10/13   right foot next to the last toe  . Insomnia 04/20/2013  . Joint pain   . Lactose intolerance   . Leg edema   . Obesity   . Pedal edema 02/20/2015  . Prediabetes   . Preventative health care 09/25/2015  . PVC (premature ventricular contraction)     PAST SURGICAL HISTORY: Past Surgical  History:  Procedure Laterality Date  . HYSTEROSCOPY N/A 10/04/2015   Procedure: HYSTEROSCOPY with Removal IUD;  Surgeon: Vanessa Kick, MD;  Location: [facility];  Service: Gynecology;  Laterality: N/A;  . lasik     eye surgery  . WISDOM TOOTH EXTRACTION      SOCIAL HISTORY: Social History   Tobacco Use  . Smoking status: Former Smoker    Packs/day: 1.00    Years: 15.00    Pack years: 15.00    Last attempt to quit: 09/03/2001    Years since quitting: 17.4  . Smokeless tobacco: Never Used  Substance Use Topics  . Alcohol use: Yes    Comment: rare  . Drug use: No    FAMILY HISTORY: Family History  Problem Relation Age of Onset  . Heart attack Mother 74  . Hypertension Mother   . Heart disease Mother   . Obesity Mother   . Cancer Father        prostate  . Heart attack Sister 31  . Hypertension Other     ROS: Review of Systems  Constitutional: Positive for weight loss.  Cardiovascular: Negative for chest pain.       Negative chest pressure   Neurological: Negative for headaches.  Endo/Heme/Allergies:       Negative hypoglycemia    PHYSICAL EXAM: Pt in no acute distress  RECENT LABS AND TESTS: BMET    Component Value Date/Time   NA 142 11/20/2018 0828   K 3.9 11/20/2018 0828   CL 101 11/20/2018 0828   CO2 25 11/20/2018 0828   GLUCOSE 98 11/20/2018 0828   GLUCOSE 86 03/13/2018 1152   BUN 10 11/20/2018 0828   CREATININE 0.73 11/20/2018 0828   CREATININE 0.72 02/03/2014 1648   CALCIUM 8.9 11/20/2018 0828   GFRNONAA 96 11/20/2018 0828   GFRAA 111 11/20/2018 0828   Lab Results  Component Value Date   HGBA1C 6.1 (H) 11/20/2018   HGBA1C 5.9 (H) 07/14/2018   HGBA1C 5.9 03/13/2018   HGBA1C 5.8 (H) 09/12/2017   HGBA1C 6.2 (H) 05/23/2017   Lab Results  Component Value Date   INSULIN 21.2 11/20/2018   INSULIN 15.4 07/14/2018   INSULIN 12.9 09/12/2017   INSULIN 31.1 (H) 05/23/2017   CBC    Component Value Date/Time   WBC 5.9 03/13/2018 1152   RBC 4.35  03/13/2018 1152   HGB 11.9 (L) 03/13/2018 1152   HGB 12.1 05/23/2017 1025   HCT 35.9 (L) 03/13/2018 1152  HCT 36.6 05/23/2017 1025   PLT 283.0 03/13/2018 1152   MCV 82.4 03/13/2018 1152   MCV 82 05/23/2017 1025   MCH 27.1 05/23/2017 1025   MCH 28.2 02/03/2014 1648   MCHC 33.2 03/13/2018 1152   RDW 15.0 03/13/2018 1152   RDW 14.9 05/23/2017 1025   LYMPHSABS 1.7 05/23/2017 1025   MONOABS 0.4 06/09/2014 0843   EOSABS 0.1 05/23/2017 1025   BASOSABS 0.0 05/23/2017 1025   Iron/TIBC/Ferritin/ %Sat No results found for: IRON, TIBC, FERRITIN, IRONPCTSAT Lipid Panel     Component Value Date/Time   CHOL 146 11/20/2018 0828   TRIG 89 11/20/2018 0828   HDL 35 (L) 11/20/2018 0828   CHOLHDL 3 03/13/2018 1152   VLDL 13.2 03/13/2018 1152   LDLCALC 93 11/20/2018 0828   Hepatic Function Panel     Component Value Date/Time   PROT 6.9 11/20/2018 0828   ALBUMIN 4.2 11/20/2018 0828   AST 9 11/20/2018 0828   ALT 11 11/20/2018 0828   ALKPHOS 80 11/20/2018 0828   BILITOT 0.3 11/20/2018 0828   BILIDIR 0.0 06/09/2014 0843   IBILI 0.1 (L) 02/03/2014 1648      Component Value Date/Time   TSH 2.10 03/13/2018 1152   TSH 1.710 05/23/2017 1025   TSH 1.26 03/15/2017 1140      I, Trixie Dredge, am acting as Location manager for Ilene Qua, MD  I have reviewed the above documentation for accuracy and completeness, and I agree with the above. - Ilene Qua, MD

## 2019-02-06 MED FILL — LOSARTAN POTASSIUM 100 MG T: 100 | 30 days supply | Qty: 30 | Fill #3

## 2019-02-06 MED FILL — MONTELUKAST SOD 10 MG TAB: 10 | 90 days supply | Qty: 90 | Fill #1

## 2019-02-06 MED FILL — HYDROCHLOROTHIAZIDE 25 MG T: 25 | 30 days supply | Qty: 30 | Fill #3

## 2019-02-06 MED FILL — AMLODIPINE BESYLATE 10 MG T: 10 | 30 days supply | Qty: 30 | Fill #3

## 2019-02-06 MED FILL — metFORMIN HCL 500 MG TABS: 500 | 60 days supply | Qty: 60 | Fill #0

## 2019-02-16 ENCOUNTER — Other Ambulatory Visit: Payer: Self-pay

## 2019-02-16 ENCOUNTER — Ambulatory Visit (INDEPENDENT_AMBULATORY_CARE_PROVIDER_SITE_OTHER): Payer: BC Managed Care – PPO | Admitting: Family Medicine

## 2019-02-16 ENCOUNTER — Encounter (INDEPENDENT_AMBULATORY_CARE_PROVIDER_SITE_OTHER): Payer: Self-pay | Admitting: Family Medicine

## 2019-02-16 DIAGNOSIS — F3289 Other specified depressive episodes: Secondary | ICD-10-CM

## 2019-02-16 DIAGNOSIS — E559 Vitamin D deficiency, unspecified: Secondary | ICD-10-CM | POA: Diagnosis not present

## 2019-02-16 DIAGNOSIS — Z6841 Body Mass Index (BMI) 40.0 and over, adult: Secondary | ICD-10-CM

## 2019-02-16 MED ORDER — BUPROPION HCL ER (SR) 200 MG PO TB12: 200.0000 mg | ORAL_TABLET | Freq: Every day | ORAL | 0 refills | Status: DC

## 2019-02-16 MED ORDER — VITAMIN D (ERGOCALCIFEROL) 1.25 MG (50000 UNIT) PO CAPS: 50000.0000 [IU] | ORAL_CAPSULE | ORAL | 0 refills | Status: DC

## 2019-02-16 MED FILL — BUPROPION HCL SR 200 MG TAB: 200 | 90 days supply | Qty: 90 | Fill #0

## 2019-02-16 MED FILL — VIT D2 1.25 MG (50,000 UNIT: 1.25 MG | 28 days supply | Qty: 4 | Fill #0

## 2019-02-16 NOTE — Telephone Encounter (Signed)
Please advise 

## 2019-02-17 NOTE — Progress Notes (Signed)
Office: [phone]  /  Fax: [phone] TeleHealth Visit:  Brandi Lester has verbally consented to this TeleHealth visit today. The patient is located at home, the provider is located at the [facility] office. The participants in this visit include the listed provider and patient. The visit was conducted today via face time.  HPI:   Chief Complaint: OBESITY Brandi Lester is here to discuss her progress with her obesity treatment plan. She is on the  follow the Category 3 plan and is following her eating plan approximately 85 % of the time. She states she is exercising 2 minutes 6 times per week. Brandi Lester is looking into getting a recumbent bike, but she is experiencing some issues with her insurance. She is following the plan about 85 % of the time, occasionally having cravings for potato chips.  We were unable to weigh the patient today for this TeleHealth visit. She feels as if she has maintained her weight since her last visit. She has lost 17 lbs since starting treatment with [name].  Vitamin D Deficiency Brandi Lester has a diagnosis of vitamin D deficiency. She is currently taking prescription Vit D. She notes fatigue and denies nausea, vomiting or muscle weakness.  Depression with Emotional Eating Behaviors Brandi Lester's blood pressure is controlled at home (132/68). She denies any side effects. Brandi Lester struggles with emotional eating and using food for comfort to the extent that it is negatively impacting her health. She often snacks when she is not hungry. Brandi Lester sometimes feels she is out of control and then feels guilty that she made poor food choices. She has been working on behavior modification techniques to help reduce her emotional eating and has been somewhat successful. She shows no sign of suicidal or homicidal ideations.  Depression screen Brandi Lester 2/9 05/23/2017 07/03/2016 04/13/2016  Decreased Interest 2 0 0  Down, Depressed, Hopeless 1 0 0  PHQ - 2 Score 3 0 0  Altered  sleeping 1 - -  Tired, decreased energy 1 - -  Change in appetite 1 - -  Feeling bad or failure about yourself  0 - -  Trouble concentrating 1 - -  Moving slowly or fidgety/restless 0 - -  Suicidal thoughts 0 - -  PHQ-9 Score 7 - -  Difficult doing work/chores Not difficult at all - -    ASSESSMENT AND PLAN:  Vitamin D deficiency - Plan: Vitamin D, Ergocalciferol, (DRISDOL) 1.25 MG (50000 UT) CAPS capsule  Other depression - with emotional eating - Plan: buPROPion (WELLBUTRIN SR) 200 MG 12 hr tablet  Class 3 severe obesity with serious comorbidity and body mass index (BMI) of 45.0 to 49.9 in adult, unspecified obesity type (HCC)  PLAN:  Vitamin D Deficiency Brandi Lester was informed that low vitamin D levels contributes to fatigue and are associated with obesity, breast, and colon cancer. Brandi Lester agrees to continue taking prescription Vit D 50,000 IU every week #4 and we will refill for 1 month. She will follow up for routine testing of vitamin D, at least 2-3 times per year. She was informed of the risk of over-replacement of vitamin D and agrees to not increase her dose unless she discusses this with [name] first. Brandi Lester agrees to follow up with our clinic in 2 weeks.  Depression with Emotional Eating Behaviors We discussed behavior modification techniques today to help Brandi Lester deal with her emotional eating and depression. Brandi Lester agrees to continue taking Wellbutrin SR 200 mg PO daily #90 day supply with no refills. Brandi Lester agrees  to follow up with our clinic in 2 weeks.  Obesity Brandi Lester is currently in the action stage of change. As such, her goal is to continue with weight loss efforts She has agreed to follow the Category 3 plan Brandi Lester has been instructed to work up to a goal of 150 minutes of combined cardio and strengthening exercise per week for weight loss and overall health benefits. We discussed the following Behavioral Modification Strategies today: increasing lean protein  intake, increasing vegetables and work on meal planning and easy cooking plans, keeping healthy foods in the home, better snacking choices, and planning for success   Brandi Lester has agreed to follow up with our clinic in 2 weeks. She was informed of the importance of frequent follow up visits to maximize her success with intensive lifestyle modifications for her multiple health conditions.  ALLERGIES: Allergies  Allergen Reactions  . Penicillins Itching and Swelling    Has patient had a PCN reaction causing immediate rash, facial/tongue/throat swelling, SOB or lightheadedness with hypotension: yes Has patient had a PCN reaction causing severe rash involving mucus membranes or skin necrosis: no Has patient had a PCN reaction that required hospitalization no Has patient had a PCN reaction occurring within the last 10 years: yes If all of the above answers are "NO", then may proceed with Cephalosporin use.   [name] [Liraglutide]     Vomiting    MEDICATIONS: Current Outpatient Medications on File Prior to Visit  Medication Sig Dispense Refill  . acetaminophen (TYLENOL 8 HOUR) 650 MG CR tablet Take 1 tablet (650 mg total) every 8 (eight) hours as needed by mouth for pain.    [name] amLODipine (NORVASC) 10 MG tablet Take 1 tablet (10 mg total) by mouth daily. 90 tablet 3  . azelastine (ASTELIN) 0.1 % nasal spray Place 2 sprays into both nostrils 2 (two) times daily. Use in each nostril as directed 90 mL 1  . EPINEPHrine (EPIPEN 2-PAK) 0.3 mg/0.3 mL IJ SOAJ injection Use as directed for severe allergic reaction 2 Device 1  . fexofenadine (ALLEGRA ALLERGY) 180 MG tablet Take 1 tablet (180 mg total) by mouth daily. 301 tablet 1  . fluticasone (FLONASE) 50 MCG/ACT nasal spray Place 2 sprays into both nostrils daily. (Patient taking differently: Place 2 sprays into both nostrils daily as needed for allergies. ) 16 g 11  . glucose blood test strip OneTouch Ultra Test strips    . hydrochlorothiazide  (HYDRODIURIL) 25 MG tablet Take 1 tablet (25 mg total) by mouth daily. 90 tablet 1  . ibuprofen (ADVIL,MOTRIN) 600 MG tablet Take 1 tablet (600 mg total) by mouth every 6 (six) hours as needed. 90 tablet 0  . losartan (COZAAR) 100 MG tablet Take 1 tablet (100 mg total) by mouth at bedtime. 90 tablet 1  . medroxyPROGESTERone (PROVERA) 10 MG tablet   6  . metFORMIN (GLUCOPHAGE) 500 MG tablet Take 1 tablet (500 mg total) by mouth daily with breakfast. 60 tablet 0  . montelukast (SINGULAIR) 10 MG tablet Take 1 tablet (10 mg total) by mouth at bedtime as needed. (Patient not taking: Reported on 01/16/2019) 90 tablet 1  . Omega-3 Fatty Acids (FISH OIL PO) Take 1 capsule by mouth daily.    [name] DELICA LANCETS 29N MISC OneTouch Delica Lancets 33 gauge    . pantoprazole (PROTONIX) 40 MG tablet TAKE 1 TABLET (40 MG TOTAL) BY MOUTH DAILY. 90 tablet 1  . potassium chloride SA (K-DUR,KLOR-CON) 20 MEQ tablet Take 1 tablet (20  mEq total) by mouth 3 (three) times daily. 270 tablet 1  . Probiotic Product (PROBIOTIC-10 PO) Take by mouth daily.    . TURMERIC PO Take 1 capsule by mouth daily.     No current facility-administered medications on file prior to visit.     PAST MEDICAL HISTORY: Past Medical History:  Diagnosis Date  . ADD (attention deficit disorder)   . Allergy    allergic rhinitis  . Anemia 07/25/2013  . Angio-edema   . Back pain   . Costochondritis 02/20/2015  . Diabetes mellitus    Type II   previously 3 years ago - no longer on meds   . Dyslipidemia 07/25/2013  . Fungus infection 10/13   Left great toe  . GERD (gastroesophageal reflux disease)   . Hypertension   . IBS (irritable bowel syndrome)   . Ingrown nail 10/13   right foot next to the last toe  . Insomnia 04/20/2013  . Joint pain   . Lactose intolerance   . Leg edema   . Obesity   . Pedal edema 02/20/2015  . Prediabetes   . Preventative health care 09/25/2015  . PVC (premature ventricular contraction)     PAST  SURGICAL HISTORY: Past Surgical History:  Procedure Laterality Date  . HYSTEROSCOPY N/A 10/04/2015   Procedure: HYSTEROSCOPY with Removal IUD;  Surgeon: Vanessa Kick, MD;  Location: [facility];  Service: Gynecology;  Laterality: N/A;  . lasik     eye surgery  . WISDOM TOOTH EXTRACTION      SOCIAL HISTORY: Social History   Tobacco Use  . Smoking status: Former Smoker    Packs/day: 1.00    Years: 15.00    Pack years: 15.00    Quit date: 09/03/2001    Years since quitting: 17.4  . Smokeless tobacco: Never Used  Substance Use Topics  . Alcohol use: Yes    Comment: rare  . Drug use: No    FAMILY HISTORY: Family History  Problem Relation Age of Onset  . Heart attack Mother 47  . Hypertension Mother   . Heart disease Mother   . Obesity Mother   . Cancer Father        prostate  . Heart attack Sister 61  . Hypertension Other     ROS: Review of Systems  Constitutional: Positive for malaise/fatigue. Negative for weight loss.  Gastrointestinal: Negative for nausea and vomiting.  Musculoskeletal:       Negative muscle weakness  Psychiatric/Behavioral: Positive for depression. Negative for suicidal ideas.    PHYSICAL EXAM: Pt in no acute distress  RECENT LABS AND TESTS: BMET    Component Value Date/Time   NA 142 11/20/2018 0828   K 3.9 11/20/2018 0828   CL 101 11/20/2018 0828   CO2 25 11/20/2018 0828   GLUCOSE 98 11/20/2018 0828   GLUCOSE 86 03/13/2018 1152   BUN 10 11/20/2018 0828   CREATININE 0.73 11/20/2018 0828   CREATININE 0.72 02/03/2014 1648   CALCIUM 8.9 11/20/2018 0828   GFRNONAA 96 11/20/2018 0828   GFRAA 111 11/20/2018 0828   Lab Results  Component Value Date   HGBA1C 6.1 (H) 11/20/2018   HGBA1C 5.9 (H) 07/14/2018   HGBA1C 5.9 03/13/2018   HGBA1C 5.8 (H) 09/12/2017   HGBA1C 6.2 (H) 05/23/2017   Lab Results  Component Value Date   INSULIN 21.2 11/20/2018   INSULIN 15.4 07/14/2018   INSULIN 12.9 09/12/2017   INSULIN 31.1 (H) 05/23/2017   CBC     Component  Value Date/Time   WBC 5.9 03/13/2018 1152   RBC 4.35 03/13/2018 1152   HGB 11.9 (L) 03/13/2018 1152   HGB 12.1 05/23/2017 1025   HCT 35.9 (L) 03/13/2018 1152   HCT 36.6 05/23/2017 1025   PLT 283.0 03/13/2018 1152   MCV 82.4 03/13/2018 1152   MCV 82 05/23/2017 1025   MCH 27.1 05/23/2017 1025   MCH 28.2 02/03/2014 1648   MCHC 33.2 03/13/2018 1152   RDW 15.0 03/13/2018 1152   RDW 14.9 05/23/2017 1025   LYMPHSABS 1.7 05/23/2017 1025   MONOABS 0.4 06/09/2014 0843   EOSABS 0.1 05/23/2017 1025   BASOSABS 0.0 05/23/2017 1025   Iron/TIBC/Ferritin/ %Sat No results found for: IRON, TIBC, FERRITIN, IRONPCTSAT Lipid Panel     Component Value Date/Time   CHOL 146 11/20/2018 0828   TRIG 89 11/20/2018 0828   HDL 35 (L) 11/20/2018 0828   CHOLHDL 3 03/13/2018 1152   VLDL 13.2 03/13/2018 1152   LDLCALC 93 11/20/2018 0828   Hepatic Function Panel     Component Value Date/Time   PROT 6.9 11/20/2018 0828   ALBUMIN 4.2 11/20/2018 0828   AST 9 11/20/2018 0828   ALT 11 11/20/2018 0828   ALKPHOS 80 11/20/2018 0828   BILITOT 0.3 11/20/2018 0828   BILIDIR 0.0 06/09/2014 0843   IBILI 0.1 (L) 02/03/2014 1648      Component Value Date/Time   TSH 2.10 03/13/2018 1152   TSH 1.710 05/23/2017 1025   TSH 1.26 03/15/2017 1140      I, Trixie Dredge, am acting as Location manager for Ilene Qua, MD  I have reviewed the above documentation for accuracy and completeness, and I agree with the above. - Ilene Qua, MD

## 2019-02-19 MED FILL — POTASSIUM CHLORIDE CRYS ER: 20 | 30 days supply | Qty: 90 | Fill #3

## 2019-02-23 ENCOUNTER — Encounter: Payer: Self-pay | Admitting: Family Medicine

## 2019-03-03 ENCOUNTER — Ambulatory Visit (INDEPENDENT_AMBULATORY_CARE_PROVIDER_SITE_OTHER): Payer: BC Managed Care – PPO | Admitting: Family Medicine

## 2019-03-03 ENCOUNTER — Encounter (INDEPENDENT_AMBULATORY_CARE_PROVIDER_SITE_OTHER): Payer: Self-pay | Admitting: Family Medicine

## 2019-03-03 ENCOUNTER — Other Ambulatory Visit: Payer: Self-pay

## 2019-03-03 VITALS — BP 138/84 | HR 109 | Temp 98.7°F | Ht 69.0 in | Wt 320.0 lb

## 2019-03-03 DIAGNOSIS — E559 Vitamin D deficiency, unspecified: Secondary | ICD-10-CM | POA: Diagnosis not present

## 2019-03-03 DIAGNOSIS — Z6841 Body Mass Index (BMI) 40.0 and over, adult: Secondary | ICD-10-CM

## 2019-03-03 DIAGNOSIS — E1165 Type 2 diabetes mellitus with hyperglycemia: Secondary | ICD-10-CM | POA: Diagnosis not present

## 2019-03-03 DIAGNOSIS — Z9189 Other specified personal risk factors, not elsewhere classified: Secondary | ICD-10-CM | POA: Diagnosis not present

## 2019-03-04 NOTE — Progress Notes (Signed)
Office: [phone]  /  Fax: [phone]   HPI:   Chief Complaint: OBESITY Brandi Lester is here to discuss her progress with her obesity treatment plan. She is on the Category 3 plan and is following her eating plan approximately 85 % of the time. She states she is walking 2 miles 5 times per week. Brandi Lester is walking again as shin splints have improved. She is starting to get back out with minimal exposure. She is looking for ways to incorporate other foods into the meal plan.  Her weight is (!) 320 lb (145.2 kg) today and has gained 9 lbs since her last visit. She has lost 5 lbs since starting treatment with [name].  Pre-Diabetes Brandi Lester has a diagnosis of pre-diabetes based on her elevated Hgb A1c of 6.1 and insulin of 21.2. She was informed this puts her at greater risk of developing diabetes. She is taking metformin currently and denies GI side effects. She continues to work on diet and exercise to decrease risk of diabetes. She denies hypoglycemia.  Vitamin D Deficiency Brandi Lester has a diagnosis of vitamin D deficiency. She is currently taking prescription Vit D. She notes fatigue and denies nausea, vomiting or muscle weakness.  At risk for osteopenia and osteoporosis Brandi Lester is at higher risk of osteopenia and osteoporosis due to vitamin D deficiency.   Hypertension Brandi Lester is a 53 y.o. female with hypertension. Brandi Lester's blood pressure is controlled. She denies chest pain, chest pressure, or headaches. She is working on weight loss to help control her blood pressure with the goal of decreasing her risk of heart attack and stroke.  ASSESSMENT AND PLAN:  Type 2 diabetes mellitus with hyperglycemia, without long-term current use of insulin (HCC) - Plan: Hemoglobin A1c, Insulin, Free and Total, Lipid Panel With LDL/HDL Ratio  Vitamin D deficiency - Plan: VITAMIN D 25 Hydroxy (Vit-D Deficiency, Fractures)  At risk for osteoporosis  Class 3 severe obesity with serious comorbidity  and body mass index (BMI) of 45.0 to 49.9 in adult, unspecified obesity type ([place])  PLAN:  Pre-Diabetes Brandi Lester will continue to work on weight loss, exercise, and decreasing simple carbohydrates in her diet to help decrease the risk of diabetes. We dicussed metformin including benefits and risks. She was informed that eating too many simple carbohydrates or too many calories at one sitting increases the likelihood of GI side effects. Brandi Lester agrees to continue taking metformin, and we will check Hgb A1c and insulin level today. Brandi Lester agrees to follow up with our clinic in 2 weeks as directed to monitor her progress.  Vitamin D Deficiency Brandi Lester was informed that low vitamin D levels contributes to fatigue and are associated with obesity, breast, and colon cancer. Brandi Lester agrees to continue taking prescription Vit D 50,000 IU every week and will follow up for routine testing of vitamin D, at least 2-3 times per year. She was informed of the risk of over-replacement of vitamin D and agrees to not increase her dose unless she discusses this with [name] first. We will check Vit D level today. Brandi Lester agrees to follow up with our clinic in 2 weeks.  At risk for osteopenia and osteoporosis Brandi Lester was given extended (15 minutes) osteoporosis prevention counseling today. Brandi Lester is at risk for osteopenia and osteoporsis due to her vitamin D deficiency. She was encouraged to take her vitamin D and follow her higher calcium diet and increase strengthening exercise to help strengthen her bones and decrease her risk of osteopenia and osteoporosis.  Hypertension We  discussed sodium restriction, working on healthy weight loss, and a regular exercise program as the means to achieve improved blood pressure control. Brandi Lester agreed with this plan and agreed to follow up as directed. We will continue to monitor her blood pressure as well as her progress with the above lifestyle modifications. Brandi Lester agrees to continue  her medications and will watch for signs of hypotension as she continues her lifestyle modifications. We will check FLP today. We will follow up on her blood pressure at next appointment. Brandi Lester agrees to follow up with our clinic in 2 weeks.  Obesity Brandi Lester is currently in the action stage of change. As such, her goal is to continue with weight loss efforts She has agreed to keep a food journal with 1450-1600 calories and 100+ grams of protein daily Brandi Lester has been instructed to work up to a goal of 150 minutes of combined cardio and strengthening exercise per week for weight loss and overall health benefits. We discussed the following Behavioral Modification Strategies today: increasing lean protein intake, increasing vegetables and work on meal planning and easy cooking plans, keeping healthy foods in the home, better snacking choices, and planning for success   Brandi Lester has agreed to follow up with our clinic in 2 weeks. She was informed of the importance of frequent follow up visits to maximize her success with intensive lifestyle modifications for her multiple health conditions.  ALLERGIES: Allergies  Allergen Reactions  . Penicillins Itching and Swelling    Has patient had a PCN reaction causing immediate rash, facial/tongue/throat swelling, SOB or lightheadedness with hypotension: yes Has patient had a PCN reaction causing severe rash involving mucus membranes or skin necrosis: no Has patient had a PCN reaction that required hospitalization no Has patient had a PCN reaction occurring within the last 10 years: yes If all of the above answers are "NO", then may proceed with Cephalosporin use.   Brandi Lester [Liraglutide]     Vomiting    MEDICATIONS: Current Outpatient Medications on File Prior to Visit  Medication Sig Dispense Refill  . acetaminophen (TYLENOL 8 HOUR) 650 MG CR tablet Take 1 tablet (650 mg total) every 8 (eight) hours as needed by mouth for pain.    [name] amLODipine  (NORVASC) 10 MG tablet Take 1 tablet (10 mg total) by mouth daily. 90 tablet 3  . azelastine (ASTELIN) 0.1 % nasal spray Place 2 sprays into both nostrils 2 (two) times daily. Use in each nostril as directed 90 mL 1  . buPROPion (WELLBUTRIN SR) 200 MG 12 hr tablet Take 1 tablet (200 mg total) by mouth daily. 90 tablet 0  . fexofenadine (ALLEGRA ALLERGY) 180 MG tablet Take 1 tablet (180 mg total) by mouth daily. 301 tablet 1  . fluticasone (FLONASE) 50 MCG/ACT nasal spray Place 2 sprays into both nostrils daily. (Patient taking differently: Place 2 sprays into both nostrils daily as needed for allergies. ) 16 g 11  . glucose blood test strip OneTouch Ultra Test strips    . hydrochlorothiazide (HYDRODIURIL) 25 MG tablet Take 1 tablet (25 mg total) by mouth daily. 90 tablet 1  . ibuprofen (ADVIL,MOTRIN) 600 MG tablet Take 1 tablet (600 mg total) by mouth every 6 (six) hours as needed. 90 tablet 0  . losartan (COZAAR) 100 MG tablet Take 1 tablet (100 mg total) by mouth at bedtime. 90 tablet 1  . medroxyPROGESTERone (PROVERA) 10 MG tablet   6  . metFORMIN (GLUCOPHAGE) 500 MG tablet Take 1 tablet (500 mg total)  by mouth daily with breakfast. 60 tablet 0  . montelukast (SINGULAIR) 10 MG tablet Take 1 tablet (10 mg total) by mouth at bedtime as needed. 90 tablet 1  . Omega-3 Fatty Acids (FISH OIL PO) Take 1 capsule by mouth daily.    [name] DELICA LANCETS 57D MISC OneTouch Delica Lancets 33 gauge    . pantoprazole (PROTONIX) 40 MG tablet TAKE 1 TABLET (40 MG TOTAL) BY MOUTH DAILY. 90 tablet 1  . potassium chloride SA (K-DUR,KLOR-CON) 20 MEQ tablet Take 1 tablet (20 mEq total) by mouth 3 (three) times daily. 270 tablet 1  . Probiotic Product (PROBIOTIC-10 PO) Take by mouth daily.    . TURMERIC PO Take 1 capsule by mouth daily.    . Vitamin D, Ergocalciferol, (DRISDOL) 1.25 MG (50000 UT) CAPS capsule Take 1 capsule (50,000 Units total) by mouth every 7 (seven) days. 4 capsule 0  . EPINEPHrine (EPIPEN  2-PAK) 0.3 mg/0.3 mL IJ SOAJ injection Use as directed for severe allergic reaction (Patient not taking: Reported on 03/03/2019) 2 Device 1   No current facility-administered medications on file prior to visit.     PAST MEDICAL HISTORY: Past Medical History:  Diagnosis Date  . ADD (attention deficit disorder)   . Allergy    allergic rhinitis  . Anemia 07/25/2013  . Angio-edema   . Back pain   . Costochondritis 02/20/2015  . Diabetes mellitus    Type II   previously 3 years ago - no longer on meds   . Dyslipidemia 07/25/2013  . Fungus infection 10/13   Left great toe  . GERD (gastroesophageal reflux disease)   . Hypertension   . IBS (irritable bowel syndrome)   . Ingrown nail 10/13   right foot next to the last toe  . Insomnia 04/20/2013  . Joint pain   . Lactose intolerance   . Leg edema   . Obesity   . Pedal edema 02/20/2015  . Prediabetes   . Preventative health care 09/25/2015  . PVC (premature ventricular contraction)     PAST SURGICAL HISTORY: Past Surgical History:  Procedure Laterality Date  . HYSTEROSCOPY N/A 10/04/2015   Procedure: HYSTEROSCOPY with Removal IUD;  Surgeon: Vanessa Kick, MD;  Location: [facility];  Service: Gynecology;  Laterality: N/A;  . lasik     eye surgery  . WISDOM TOOTH EXTRACTION      SOCIAL HISTORY: Social History   Tobacco Use  . Smoking status: Former Smoker    Packs/day: 1.00    Years: 15.00    Pack years: 15.00    Quit date: 09/03/2001    Years since quitting: 17.5  . Smokeless tobacco: Never Used  Substance Use Topics  . Alcohol use: Yes    Comment: rare  . Drug use: No    FAMILY HISTORY: Family History  Problem Relation Age of Onset  . Heart attack Mother 46  . Hypertension Mother   . Heart disease Mother   . Obesity Mother   . Cancer Father        prostate  . Heart attack Sister 69  . Hypertension Other     ROS: Review of Systems  Constitutional: Positive for malaise/fatigue. Negative for weight loss.   Cardiovascular: Negative for chest pain.       Negative chest pressure  Gastrointestinal: Negative for nausea and vomiting.  Musculoskeletal:       Negative muscle weakness  Neurological: Negative for headaches.  Endo/Heme/Allergies:       Negative hypoglycemia  PHYSICAL EXAM: Blood pressure 138/84, pulse (!) 109, temperature 98.7 F (37.1 C), temperature source Oral, height 5\' 9"  (1.753 m), weight (!) 320 lb (145.2 kg), SpO2 100 %. Body mass index is 47.26 kg/m. Physical Exam Vitals signs reviewed.  Constitutional:      Appearance: Normal appearance. She is obese.  Cardiovascular:     Rate and Rhythm: Normal rate.     Pulses: Normal pulses.  Pulmonary:     Effort: Pulmonary effort is normal.     Breath sounds: Normal breath sounds.  Musculoskeletal: Normal range of motion.  Skin:    General: Skin is warm and dry.  Neurological:     Mental Status: She is alert and oriented to person, place, and time.  Psychiatric:        Mood and Affect: Mood normal.        Behavior: Behavior normal.     RECENT LABS AND TESTS: BMET    Component Value Date/Time   NA 142 11/20/2018 0828   K 3.9 11/20/2018 0828   CL 101 11/20/2018 0828   CO2 25 11/20/2018 0828   GLUCOSE 98 11/20/2018 0828   GLUCOSE 86 03/13/2018 1152   BUN 10 11/20/2018 0828   CREATININE 0.73 11/20/2018 0828   CREATININE 0.72 02/03/2014 1648   CALCIUM 8.9 11/20/2018 0828   GFRNONAA 96 11/20/2018 0828   GFRAA 111 11/20/2018 0828   Lab Results  Component Value Date   HGBA1C 6.1 (H) 11/20/2018   HGBA1C 5.9 (H) 07/14/2018   HGBA1C 5.9 03/13/2018   HGBA1C 5.8 (H) 09/12/2017   HGBA1C 6.2 (H) 05/23/2017   Lab Results  Component Value Date   INSULIN 21.2 11/20/2018   INSULIN 15.4 07/14/2018   INSULIN 12.9 09/12/2017   INSULIN 31.1 (H) 05/23/2017   CBC    Component Value Date/Time   WBC 5.9 03/13/2018 1152   RBC 4.35 03/13/2018 1152   HGB 11.9 (L) 03/13/2018 1152   HGB 12.1 05/23/2017 1025   HCT 35.9  (L) 03/13/2018 1152   HCT 36.6 05/23/2017 1025   PLT 283.0 03/13/2018 1152   MCV 82.4 03/13/2018 1152   MCV 82 05/23/2017 1025   MCH 27.1 05/23/2017 1025   MCH 28.2 02/03/2014 1648   MCHC 33.2 03/13/2018 1152   RDW 15.0 03/13/2018 1152   RDW 14.9 05/23/2017 1025   LYMPHSABS 1.7 05/23/2017 1025   MONOABS 0.4 06/09/2014 0843   EOSABS 0.1 05/23/2017 1025   BASOSABS 0.0 05/23/2017 1025   Iron/TIBC/Ferritin/ %Sat No results found for: IRON, TIBC, FERRITIN, IRONPCTSAT Lipid Panel     Component Value Date/Time   CHOL 146 11/20/2018 0828   TRIG 89 11/20/2018 0828   HDL 35 (L) 11/20/2018 0828   CHOLHDL 3 03/13/2018 1152   VLDL 13.2 03/13/2018 1152   LDLCALC 93 11/20/2018 0828   Hepatic Function Panel     Component Value Date/Time   PROT 6.9 11/20/2018 0828   ALBUMIN 4.2 11/20/2018 0828   AST 9 11/20/2018 0828   ALT 11 11/20/2018 0828   ALKPHOS 80 11/20/2018 0828   BILITOT 0.3 11/20/2018 0828   BILIDIR 0.0 06/09/2014 0843   IBILI 0.1 (L) 02/03/2014 1648      Component Value Date/Time   TSH 2.10 03/13/2018 1152   TSH 1.710 05/23/2017 1025   TSH 1.26 03/15/2017 1140      OBESITY BEHAVIORAL INTERVENTION VISIT  Today's visit was # 38   Starting weight: 325 lbs Starting date: 05/23/17 Today's weight : 320 lbs Today's date:  03/03/2019 Total lbs lost to date: 5    ASK: We discussed the diagnosis of obesity with [name] today and Jerine agreed to give [name] permission to discuss obesity behavioral modification therapy today.  ASSESS: Nastasia has the diagnosis of obesity and her BMI today is 47.23 Zadia is in the action stage of change   ADVISE: Makisha was educated on the multiple health risks of obesity as well as the benefit of weight loss to improve her health. She was advised of the need for long term treatment and the importance of lifestyle modifications to improve her current health and to decrease her risk of future health problems.  AGREE: Multiple  dietary modification options and treatment options were discussed and  Adalai agreed to follow the recommendations documented in the above note.  ARRANGE: Verl was educated on the importance of frequent visits to treat obesity as outlined per CMS and USPSTF guidelines and agreed to schedule her next follow up appointment today.  I, Trixie Dredge, am acting as transcriptionist for Ilene Qua, MD  I have reviewed the above documentation for accuracy and completeness, and I agree with the above. - Ilene Qua, MD

## 2019-03-13 ENCOUNTER — Ambulatory Visit (INDEPENDENT_AMBULATORY_CARE_PROVIDER_SITE_OTHER): Payer: BC Managed Care – PPO | Admitting: Family Medicine

## 2019-03-13 ENCOUNTER — Other Ambulatory Visit: Payer: Self-pay

## 2019-03-13 DIAGNOSIS — I10 Essential (primary) hypertension: Secondary | ICD-10-CM

## 2019-03-13 DIAGNOSIS — E876 Hypokalemia: Secondary | ICD-10-CM

## 2019-03-13 DIAGNOSIS — E6609 Other obesity due to excess calories: Secondary | ICD-10-CM

## 2019-03-13 DIAGNOSIS — E1169 Type 2 diabetes mellitus with other specified complication: Secondary | ICD-10-CM | POA: Diagnosis not present

## 2019-03-13 DIAGNOSIS — E669 Obesity, unspecified: Secondary | ICD-10-CM

## 2019-03-13 DIAGNOSIS — E559 Vitamin D deficiency, unspecified: Secondary | ICD-10-CM

## 2019-03-13 DIAGNOSIS — E785 Hyperlipidemia, unspecified: Secondary | ICD-10-CM

## 2019-03-13 DIAGNOSIS — T7840XA Allergy, unspecified, initial encounter: Secondary | ICD-10-CM

## 2019-03-13 DIAGNOSIS — E119 Type 2 diabetes mellitus without complications: Secondary | ICD-10-CM

## 2019-03-13 MED ORDER — MONTELUKAST SODIUM 10 MG PO TABS: 10.0000 mg | ORAL_TABLET | Freq: Every evening | ORAL | 1 refills | Status: DC | PRN

## 2019-03-13 MED ORDER — AMLODIPINE BESYLATE 10 MG PO TABS: 10.0000 mg | ORAL_TABLET | Freq: Every day | ORAL | 1 refills | Status: DC

## 2019-03-13 MED ORDER — AZELASTINE HCL 0.1 % NA SOLN: 2.0000 | Freq: Two times a day (BID) | NASAL | 1 refills | Status: DC

## 2019-03-13 MED ORDER — FLUTICASONE PROPIONATE 50 MCG/ACT NA SUSP: 2.0000 | Freq: Every day | NASAL | 1 refills | Status: DC | PRN

## 2019-03-13 MED ORDER — AZITHROMYCIN 250 MG PO TABS: ORAL_TABLET | ORAL | 0 refills | Status: DC

## 2019-03-13 MED ORDER — HYDROCHLOROTHIAZIDE 25 MG PO TABS: 25.0000 mg | ORAL_TABLET | Freq: Every day | ORAL | 1 refills | Status: DC

## 2019-03-13 MED ORDER — EPINEPHRINE 0.3 MG/0.3ML IJ SOAJ: INTRAMUSCULAR | 1 refills | Status: DC

## 2019-03-13 MED ORDER — PANTOPRAZOLE SODIUM 40 MG PO TBEC: 40.0000 mg | DELAYED_RELEASE_TABLET | Freq: Every day | ORAL | 1 refills | Status: DC

## 2019-03-13 MED ORDER — POTASSIUM CHLORIDE CRYS ER 20 MEQ PO TBCR: 20.0000 meq | EXTENDED_RELEASE_TABLET | Freq: Three times a day (TID) | ORAL | 1 refills | Status: DC

## 2019-03-13 MED ORDER — LOSARTAN POTASSIUM 100 MG PO TABS: 100.0000 mg | ORAL_TABLET | Freq: Every day | ORAL | 1 refills | Status: DC

## 2019-03-13 MED ORDER — METFORMIN HCL 500 MG PO TABS: 750.0000 mg | ORAL_TABLET | Freq: Every day | ORAL | 1 refills | Status: DC

## 2019-03-13 MED FILL — metFORMIN HCL 500 MG TABS: 500 | 90 days supply | Qty: 135 | Fill #0

## 2019-03-13 MED FILL — AZITHROMYCIN 250 MG TABLET: 250 | 5 days supply | Qty: 6 | Fill #0

## 2019-03-13 MED FILL — AZELASTINE HCL 137 MCG SPRY: 0.1 | 75 days supply | Qty: 90 | Fill #0

## 2019-03-13 MED FILL — EPINEPHRINE 0.3 MG AUTO-INJ: 0.3 | 2 days supply | Qty: 2 | Fill #0

## 2019-03-13 MED FILL — FLUTICASONE PROP 50 MCG SPR: 50 | 90 days supply | Qty: 48 | Fill #0

## 2019-03-13 MED FILL — HYDROCHLOROTHIAZIDE 25 MG T: 25 | 90 days supply | Qty: 90 | Fill #0

## 2019-03-13 MED FILL — LOSARTAN POTASSIUM 100 MG T: 100 | 90 days supply | Qty: 90 | Fill #0

## 2019-03-13 MED FILL — AMLODIPINE BESYLATE 10 MG T: 10 | 90 days supply | Qty: 90 | Fill #0

## 2019-03-13 MED FILL — PANTOPRAZOLE SOD DR 40 MG T: 40 | 90 days supply | Qty: 90 | Fill #0

## 2019-03-13 NOTE — Progress Notes (Signed)
Virtual Visit via Video Note  I connected with Brandi Lester on 03/13/19 at 10:00 AM EDT by a video enabled telemedicine application and verified that I am speaking with the correct person using two identifiers.  Location: Patient: home Provider: home   I discussed the limitations of evaluation and management by telemedicine and the availability of in person appointments. The patient expressed understanding and agreed to proceed. Princess Eulas Post CMA was able to get patient set up on a video visit    Subjective:    Patient ID: Brandi Lester, female    DOB: 01/29/1966, 53 y.o.   MRN: [id number]  No chief complaint on file.   HPI Patient is in today for follow up on chronic medical concerns including hypertension, diabetes, hyperlipidemia and more. 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 Denies CP/palp/SOB/HA/congestion/fevers/GI or GU c/o. Taking meds as prescribed  Past Medical History:  Diagnosis Date  . ADD (attention deficit disorder)   . Allergy    allergic rhinitis  . Anemia 07/25/2013  . Angio-edema   . Back pain   . Costochondritis 02/20/2015  . Diabetes mellitus    Type II   previously 3 years ago - no longer on meds   . Dyslipidemia 07/25/2013  . Fungus infection 10/13   Left great toe  . GERD (gastroesophageal reflux disease)   . Hypertension   . IBS (irritable bowel syndrome)   . Ingrown nail 10/13   right foot next to the last toe  . Insomnia 04/20/2013  . Joint pain   . Lactose intolerance   . Leg edema   . Obesity   . Pedal edema 02/20/2015  . Prediabetes   . Preventative health care 09/25/2015  . PVC (premature ventricular contraction)     Past Surgical History:  Procedure Laterality Date  . HYSTEROSCOPY N/A 10/04/2015   Procedure: HYSTEROSCOPY with Removal IUD;  Surgeon:  Vanessa Kick, MD;  Location: [facility];  Service: Gynecology;  Laterality: N/A;  . lasik     eye surgery  . WISDOM TOOTH EXTRACTION      Family History  Problem Relation Age of Onset  . Heart attack Mother 47  . Hypertension Mother   . Heart disease Mother   . Obesity Mother   . Cancer Father        prostate  . Heart attack Sister 21  . Hypertension Other     Social History   Socioeconomic History  . Marital status: Single    Spouse name: Not on file  . Number of children: Not on file  . Years of education: Not on file  . Highest education level: Not on file  Occupational History  . Occupation: Pharmacist, hospital, [employer] manager  Social Needs  . Financial resource strain: Not on file  . Food insecurity    Worry: Not on file    Inability: Not on file  . Transportation needs    Medical: Not on file    Non-medical: Not on file  Tobacco Use  . Smoking status: Former Smoker    Packs/day: 1.00    Years: 15.00    Pack years: 15.00    Quit date: 09/03/2001    Years since quitting: 17.5  . Smokeless tobacco: Never Used  Substance and Sexual Activity  . Alcohol use:  Yes    Comment: rare  . Drug use: No  . Sexual activity: Not on file  Lifestyle  . Physical activity    Days per week: Not on file    Minutes per session: Not on file  . Stress: Not on file  Relationships  . Social Herbalist on phone: Not on file    Gets together: Not on file    Attends religious service: Not on file    Active member of club or organization: Not on file    Attends meetings of clubs or organizations: Not on file    Relationship status: Not on file  . Intimate partner violence    Fear of current or ex partner: Not on file    Emotionally abused: Not on file    Physically abused: Not on file    Forced sexual activity: Not on file  Other Topics Concern  . Not on file  Social History Narrative  . Not on file    Outpatient Medications Prior to Visit  Medication Sig Dispense Refill  .  acetaminophen (TYLENOL 8 HOUR) 650 MG CR tablet Take 1 tablet (650 mg total) every 8 (eight) hours as needed by mouth for pain.    [name] buPROPion (WELLBUTRIN SR) 200 MG 12 hr tablet Take 1 tablet (200 mg total) by mouth daily. 90 tablet 0  . fexofenadine (ALLEGRA ALLERGY) 180 MG tablet Take 1 tablet (180 mg total) by mouth daily. 301 tablet 1  . glucose blood test strip OneTouch Ultra Test strips    . ibuprofen (ADVIL,MOTRIN) 600 MG tablet Take 1 tablet (600 mg total) by mouth every 6 (six) hours as needed. 90 tablet 0  . medroxyPROGESTERone (PROVERA) 10 MG tablet   6  . Omega-3 Fatty Acids (FISH OIL PO) Take 1 capsule by mouth daily.    [name] DELICA LANCETS 69S MISC OneTouch Delica Lancets 33 gauge    . Probiotic Product (PROBIOTIC-10 PO) Take by mouth daily.    . TURMERIC PO Take 1 capsule by mouth daily.    . Vitamin D, Ergocalciferol, (DRISDOL) 1.25 MG (50000 UT) CAPS capsule Take 1 capsule (50,000 Units total) by mouth every 7 (seven) days. 4 capsule 0  . amLODipine (NORVASC) 10 MG tablet Take 1 tablet (10 mg total) by mouth daily. 90 tablet 3  . azelastine (ASTELIN) 0.1 % nasal spray Place 2 sprays into both nostrils 2 (two) times daily. Use in each nostril as directed 90 mL 1  . EPINEPHrine (EPIPEN 2-PAK) 0.3 mg/0.3 mL IJ SOAJ injection Use as directed for severe allergic reaction (Patient not taking: Reported on 03/03/2019) 2 Device 1  . fluticasone (FLONASE) 50 MCG/ACT nasal spray Place 2 sprays into both nostrils daily. (Patient taking differently: Place 2 sprays into both nostrils daily as needed for allergies. ) 16 g 11  . hydrochlorothiazide (HYDRODIURIL) 25 MG tablet Take 1 tablet (25 mg total) by mouth daily. 90 tablet 1  . losartan (COZAAR) 100 MG tablet Take 1 tablet (100 mg total) by mouth at bedtime. 90 tablet 1  . metFORMIN (GLUCOPHAGE) 500 MG tablet Take 1 tablet (500 mg total) by mouth daily with breakfast. 60 tablet 0  . montelukast (SINGULAIR) 10 MG tablet Take 1 tablet (10  mg total) by mouth at bedtime as needed. 90 tablet 1  . pantoprazole (PROTONIX) 40 MG tablet TAKE 1 TABLET (40 MG TOTAL) BY MOUTH DAILY. 90 tablet 1  . potassium chloride SA (K-DUR,KLOR-CON) 20 MEQ tablet  Take 1 tablet (20 mEq total) by mouth 3 (three) times daily. 270 tablet 1   No facility-administered medications prior to visit.     Allergies  Allergen Reactions  . Penicillins Itching and Swelling    Has patient had a PCN reaction causing immediate rash, facial/tongue/throat swelling, SOB or lightheadedness with hypotension: yes Has patient had a PCN reaction causing severe rash involving mucus membranes or skin necrosis: no Has patient had a PCN reaction that required hospitalization no Has patient had a PCN reaction occurring within the last 10 years: yes If all of the above answers are "NO", then may proceed with Cephalosporin use.   [name] [Liraglutide]     Vomiting    Review of Systems  Constitutional: Negative for fever and malaise/fatigue.  HENT: Negative for congestion.   Eyes: Negative for blurred vision.  Respiratory: Negative for shortness of breath.   Cardiovascular: Negative for chest pain, palpitations and leg swelling.  Gastrointestinal: Negative for abdominal pain, blood in stool and nausea.  Genitourinary: Negative for dysuria and frequency.  Musculoskeletal: Negative for falls.  Skin: Negative for rash.  Neurological: Negative for dizziness, loss of consciousness and headaches.  Endo/Heme/Allergies: Negative for environmental allergies.  Psychiatric/Behavioral: Negative for depression. The patient is not nervous/anxious.        Objective:    Physical Exam Constitutional:      Appearance: Normal appearance. She is obese. She is not ill-appearing.  HENT:     Head: Normocephalic and atraumatic.  Eyes:     General:        Right eye: No discharge.        Left eye: No discharge.  Pulmonary:     Effort: Pulmonary effort is normal.  Neurological:      Mental Status: She is alert and oriented to person, place, and time.  Psychiatric:        Mood and Affect: Mood normal.        Behavior: Behavior normal.     There were no vitals taken for this visit. Wt Readings from Last 3 Encounters:  03/03/19 (!) 320 lb (145.2 kg)  11/20/18 (!) 311 lb (141.1 kg)  10/29/18 (!) 315 lb (142.9 kg)    Diabetic Foot Exam - Simple   No data filed     Lab Results  Component Value Date   WBC 5.9 03/13/2018   HGB 11.9 (L) 03/13/2018   HCT 35.9 (L) 03/13/2018   PLT 283.0 03/13/2018   GLUCOSE 98 11/20/2018   CHOL 146 11/20/2018   TRIG 89 11/20/2018   HDL 35 (L) 11/20/2018   LDLCALC 93 11/20/2018   ALT 11 11/20/2018   AST 9 11/20/2018   NA 142 11/20/2018   K 3.9 11/20/2018   CL 101 11/20/2018   CREATININE 0.73 11/20/2018   BUN 10 11/20/2018   CO2 25 11/20/2018   TSH 2.10 03/13/2018   HGBA1C 6.1 (H) 11/20/2018   MICROALBUR 0.50 12/15/2012    Lab Results  Component Value Date   TSH 2.10 03/13/2018   Lab Results  Component Value Date   WBC 5.9 03/13/2018   HGB 11.9 (L) 03/13/2018   HCT 35.9 (L) 03/13/2018   MCV 82.4 03/13/2018   PLT 283.0 03/13/2018   Lab Results  Component Value Date   NA 142 11/20/2018   K 3.9 11/20/2018   CO2 25 11/20/2018   GLUCOSE 98 11/20/2018   BUN 10 11/20/2018   CREATININE 0.73 11/20/2018   BILITOT 0.3 11/20/2018   ALKPHOS  80 11/20/2018   AST 9 11/20/2018   ALT 11 11/20/2018   PROT 6.9 11/20/2018   ALBUMIN 4.2 11/20/2018   CALCIUM 8.9 11/20/2018   GFR 136.18 03/13/2018   Lab Results  Component Value Date   CHOL 146 11/20/2018   Lab Results  Component Value Date   HDL 35 (L) 11/20/2018   Lab Results  Component Value Date   LDLCALC 93 11/20/2018   Lab Results  Component Value Date   TRIG 89 11/20/2018   Lab Results  Component Value Date   CHOLHDL 3 03/13/2018   Lab Results  Component Value Date   HGBA1C 6.1 (H) 11/20/2018       Assessment & Plan:   Problem List Items  Addressed This Visit    Obesity    Is following with [facility]. 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       Relevant Medications   metFORMIN (GLUCOPHAGE) 500 MG tablet   Essential hypertension    Encouraged to check vitals weekly and report any concerns. no changes to meds. Encouraged heart healthy diet such as the DASH diet and exercise as tolerated.       Relevant Medications   EPINEPHrine (EPIPEN 2-PAK) 0.3 mg/0.3 mL IJ SOAJ injection   amLODipine (NORVASC) 10 MG tablet   hydrochlorothiazide (HYDRODIURIL) 25 MG tablet   losartan (COZAAR) 100 MG tablet   Diabetes mellitus type 2 in obese (HCC)    hgba1c acceptable, minimize simple carbs. Increase exercise as tolerated.       Relevant Medications   losartan (COZAAR) 100 MG tablet   metFORMIN (GLUCOPHAGE) 500 MG tablet   Dyslipidemia    Encouraged heart healthy diet, increase exercise, avoid trans fats and simple carbs.       Hypokalemia   Relevant Medications   potassium chloride SA (K-DUR) 20 MEQ tablet   Vitamin D deficiency    Supplement and monitor       Other Visit Diagnoses    Allergic state, initial encounter       Relevant Medications   fluticasone (FLONASE) 50 MCG/ACT nasal spray   potassium chloride SA (K-DUR) 20 MEQ tablet   Type 2 diabetes mellitus without complication, without long-term current use of insulin (HCC)       Relevant Medications   losartan (COZAAR) 100 MG tablet   metFORMIN (GLUCOPHAGE) 500 MG tablet   Benign essential HTN       Relevant Medications   EPINEPHrine (EPIPEN 2-PAK) 0.3 mg/0.3 mL IJ SOAJ injection   amLODipine (NORVASC) 10 MG tablet   hydrochlorothiazide (HYDRODIURIL) 25 MG tablet   losartan (COZAAR) 100 MG tablet   potassium chloride SA (K-DUR) 20 MEQ tablet      I have changed Lainy T. Keo's fluticasone,  metFORMIN, and potassium chloride SA. I am also having her start on azithromycin. Additionally, I am having her maintain her fexofenadine, TURMERIC PO, Omega-3 Fatty Acids (FISH OIL PO), ibuprofen, OneTouch Delica Lancets 37S, glucose blood, Probiotic Product (PROBIOTIC-10 PO), acetaminophen, medroxyPROGESTERone, Vitamin D (Ergocalciferol), buPROPion, EPINEPHrine, azelastine, pantoprazole, amLODipine, hydrochlorothiazide, losartan, and montelukast.  Meds ordered this encounter  Medications  . EPINEPHrine (EPIPEN 2-PAK) 0.3 mg/0.3 mL IJ SOAJ injection    Sig: Use as directed for severe allergic reaction    Dispense:  2 each    Refill:  1  . azelastine (ASTELIN) 0.1 % nasal spray    Sig: Place 2 sprays into both nostrils 2 (two) times daily. Use in each nostril as directed    Dispense:  90 mL    Refill:  1  . fluticasone (FLONASE) 50 MCG/ACT nasal spray    Sig: Place 2 sprays into both nostrils daily as needed for allergies.    Dispense:  48 g    Refill:  1  . pantoprazole (PROTONIX) 40 MG tablet    Sig: Take 1 tablet (40 mg total) by mouth daily.    Dispense:  90 tablet    Refill:  1  . amLODipine (NORVASC) 10 MG tablet    Sig: Take 1 tablet (10 mg total) by mouth daily.    Dispense:  90 tablet    Refill:  1  . hydrochlorothiazide (HYDRODIURIL) 25 MG tablet    Sig: Take 1 tablet (25 mg total) by mouth daily.    Dispense:  90 tablet    Refill:  1  . losartan (COZAAR) 100 MG tablet    Sig: Take 1 tablet (100 mg total) by mouth at bedtime.    Dispense:  90 tablet    Refill:  1  . metFORMIN (GLUCOPHAGE) 500 MG tablet    Sig: Take 1.5 tablets (750 mg total) by mouth daily with breakfast.    Dispense:  135 tablet    Refill:  1  . potassium chloride SA (K-DUR) 20 MEQ tablet    Sig: Take 1 tablet (20 mEq total) by mouth 3 (three) times daily.    Dispense:  270 tablet    Refill:  1  . azithromycin (ZITHROMAX) 250 MG tablet    Sig: 2 tabs po once then 1 tab po daily x 4 days     Dispense:  6 tablet    Refill:  0  . montelukast (SINGULAIR) 10 MG tablet    Sig: Take 1 tablet (10 mg total) by mouth at bedtime as needed.    Dispense:  90 tablet    Refill:  1     I discussed the assessment and treatment plan with the patient. The patient was provided an opportunity to ask questions and all were answered. The patient agreed with the plan and demonstrated an understanding of the instructions.   The patient was advised to call back or seek an in-person evaluation if the symptoms worsen or if the condition fails to improve as anticipated.  I provided 25 minutes of non-face-to-face time during this encounter.   Penni Homans, MD

## 2019-03-13 NOTE — Assessment & Plan Note (Signed)
Supplement and monitor 

## 2019-03-13 NOTE — Assessment & Plan Note (Signed)
Encouraged heart healthy diet, increase exercise, avoid trans fats and simple carbs.  

## 2019-03-13 NOTE — Assessment & Plan Note (Signed)
hgba1c acceptable, minimize simple carbs. Increase exercise as tolerated.  

## 2019-03-13 NOTE — Assessment & Plan Note (Addendum)
Is following with [facility]. 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

## 2019-03-13 NOTE — Assessment & Plan Note (Signed)
Encouraged to check vitals weekly and report any concerns.  no changes to meds. Encouraged heart healthy diet such as the DASH diet and exercise as tolerated.  

## 2019-03-17 ENCOUNTER — Encounter (INDEPENDENT_AMBULATORY_CARE_PROVIDER_SITE_OTHER): Payer: Self-pay | Admitting: Family Medicine

## 2019-03-17 ENCOUNTER — Other Ambulatory Visit: Payer: Self-pay

## 2019-03-17 ENCOUNTER — Ambulatory Visit (INDEPENDENT_AMBULATORY_CARE_PROVIDER_SITE_OTHER): Payer: BC Managed Care – PPO | Admitting: Family Medicine

## 2019-03-17 DIAGNOSIS — E119 Type 2 diabetes mellitus without complications: Secondary | ICD-10-CM | POA: Diagnosis not present

## 2019-03-17 DIAGNOSIS — Z6841 Body Mass Index (BMI) 40.0 and over, adult: Secondary | ICD-10-CM

## 2019-03-17 DIAGNOSIS — I10 Essential (primary) hypertension: Secondary | ICD-10-CM | POA: Diagnosis not present

## 2019-03-17 MED ORDER — METFORMIN HCL 500 MG PO TABS: 500.0000 mg | ORAL_TABLET | Freq: Two times a day (BID) | ORAL | 0 refills | Status: DC

## 2019-03-18 ENCOUNTER — Encounter: Payer: Self-pay | Admitting: Family Medicine

## 2019-03-18 NOTE — Progress Notes (Signed)
Office: [phone]  /  Fax: [phone] TeleHealth Visit:  Brandi Lester has verbally consented to this TeleHealth visit today. The patient is located at home, the provider is located at the [facility] office. The participants in this visit include the listed provider and patient. The visit was conducted today via face.  HPI:   Chief Complaint: OBESITY Brandi Lester is here to discuss her progress with her obesity treatment plan. She is on the keep a food journal with 1450-1600 calories and 100+ grams of protein daily and is following her eating plan approximately 85 % of the time. She states she is walking 2 miles 7 times per week. Brandi Lester is currently experiencing a sinus infection. It started as a headache and tooth/jaw pain. She started antibiotics 4-5 days ago. She says she hasn't been as aggressive with journaling as she would have liked. She would like to continue journaling but she needs to be stricter.  We were unable to weigh the patient today for this TeleHealth visit. She feels as if she has maintained her weight since her last visit. She has lost 5 lbs since starting treatment with [name].  Vitamin D Deficiency Brandi Lester has a diagnosis of vitamin D deficiency. She is currently taking prescription Vit D. She notes fatigue and denies nausea, vomiting or muscle weakness.  Diabetes II Brandi Lester has a diagnosis of diabetes type II. Brandi Lester has done well on metformin and denies GI side effects. Last A1c was 6.1. She denies hypoglycemia. She has been working on intensive lifestyle modifications including diet, exercise, and weight loss to help control her blood glucose levels.  ASSESSMENT AND PLAN:  Type 2 diabetes mellitus without complication, without long-term current use of insulin ([place]) - Plan: metFORMIN (GLUCOPHAGE) 500 MG tablet  Essential hypertension  Class 3 severe obesity with serious comorbidity and body mass index (BMI) of 45.0 to 49.9 in adult, unspecified  obesity type ([place])  PLAN:  Vitamin D Deficiency Brandi Lester was informed that low vitamin D levels contributes to fatigue and are associated with obesity, breast, and colon cancer. Brandi Lester agrees to stop prescription Vit D and start OTC Vit D 5,000 IU daily. She will follow up for routine testing of vitamin D, at least 2-3 times per year. She was informed of the risk of over-replacement of vitamin D and agrees to not increase her dose unless she discusses this with [name] first. Brandi Lester agrees to follow up with our clinic in 2 weeks.  Diabetes II Brandi Lester has been given extensive diabetes education by myself today including ideal fasting and post-prandial blood glucose readings, individual ideal Hgb A1c goals and hypoglycemia prevention. We discussed the importance of good blood sugar control to decrease the likelihood of diabetic complications such as nephropathy, neuropathy, limb loss, blindness, coronary artery disease, and death. We discussed the importance of intensive lifestyle modification including diet, exercise and weight loss as the first line treatment for diabetes. Brandi Lester agrees to increase metformin to 500 mg PO BID #60 with no refills. Brandi Lester agrees to follow up with our clinic in 2 weeks.  Obesity Brandi Lester is currently in the action stage of change. As such, her goal is to continue with weight loss efforts She has agreed to keep a food journal with 1450-1600 calories and 100+ grams of protein daily Brandi Lester has been instructed to work up to a goal of 150 minutes of combined cardio and strengthening exercise per week for weight loss and overall health benefits. We discussed the following Behavioral Modification Strategies  today: increasing lean protein intake, increasing vegetables and work on meal planning and easy cooking plans, keeping healthy foods in the home, and planning for success   Brandi Lester has agreed to follow up with our clinic in 2 weeks. She was informed of the importance of  frequent follow up visits to maximize her success with intensive lifestyle modifications for her multiple health conditions.  ALLERGIES: Allergies  Allergen Reactions  . Penicillins Itching and Swelling    Has patient had a PCN reaction causing immediate rash, facial/tongue/throat swelling, SOB or lightheadedness with hypotension: yes Has patient had a PCN reaction causing severe rash involving mucus membranes or skin necrosis: no Has patient had a PCN reaction that required hospitalization no Has patient had a PCN reaction occurring within the last 10 years: yes If all of the above answers are "NO", then may proceed with Cephalosporin use.   [name] [Liraglutide]     Vomiting    MEDICATIONS: Current Outpatient Medications on File Prior to Visit  Medication Sig Dispense Refill  . acetaminophen (TYLENOL 8 HOUR) 650 MG CR tablet Take 1 tablet (650 mg total) every 8 (eight) hours as needed by mouth for pain.    [name] amLODipine (NORVASC) 10 MG tablet Take 1 tablet (10 mg total) by mouth daily. 90 tablet 1  . azelastine (ASTELIN) 0.1 % nasal spray Place 2 sprays into both nostrils 2 (two) times daily. Use in each nostril as directed 90 mL 1  . azithromycin (ZITHROMAX) 250 MG tablet 2 tabs po once then 1 tab po daily x 4 days 6 tablet 0  . buPROPion (WELLBUTRIN SR) 200 MG 12 hr tablet Take 1 tablet (200 mg total) by mouth daily. 90 tablet 0  . EPINEPHrine (EPIPEN 2-PAK) 0.3 mg/0.3 mL IJ SOAJ injection Use as directed for severe allergic reaction 2 each 1  . fexofenadine (ALLEGRA ALLERGY) 180 MG tablet Take 1 tablet (180 mg total) by mouth daily. 301 tablet 1  . fluticasone (FLONASE) 50 MCG/ACT nasal spray Place 2 sprays into both nostrils daily as needed for allergies. 48 g 1  . glucose blood test strip OneTouch Ultra Test strips    . hydrochlorothiazide (HYDRODIURIL) 25 MG tablet Take 1 tablet (25 mg total) by mouth daily. 90 tablet 1  . ibuprofen (ADVIL,MOTRIN) 600 MG tablet Take 1 tablet  (600 mg total) by mouth every 6 (six) hours as needed. 90 tablet 0  . losartan (COZAAR) 100 MG tablet Take 1 tablet (100 mg total) by mouth at bedtime. 90 tablet 1  . medroxyPROGESTERone (PROVERA) 10 MG tablet   6  . montelukast (SINGULAIR) 10 MG tablet Take 1 tablet (10 mg total) by mouth at bedtime as needed. 90 tablet 1  . Omega-3 Fatty Acids (FISH OIL PO) Take 1 capsule by mouth daily.    [name] DELICA LANCETS 33A MISC OneTouch Delica Lancets 33 gauge    . pantoprazole (PROTONIX) 40 MG tablet Take 1 tablet (40 mg total) by mouth daily. 90 tablet 1  . potassium chloride SA (K-DUR) 20 MEQ tablet Take 1 tablet (20 mEq total) by mouth 3 (three) times daily. 270 tablet 1  . Probiotic Product (PROBIOTIC-10 PO) Take by mouth daily.    . TURMERIC PO Take 1 capsule by mouth daily.     No current facility-administered medications on file prior to visit.     PAST MEDICAL HISTORY: Past Medical History:  Diagnosis Date  . ADD (attention deficit disorder)   . Allergy  allergic rhinitis  . Anemia 07/25/2013  . Angio-edema   . Back pain   . Costochondritis 02/20/2015  . Diabetes mellitus    Type II   previously 3 years ago - no longer on meds   . Dyslipidemia 07/25/2013  . Fungus infection 10/13   Left great toe  . GERD (gastroesophageal reflux disease)   . Hypertension   . IBS (irritable bowel syndrome)   . Ingrown nail 10/13   right foot next to the last toe  . Insomnia 04/20/2013  . Joint pain   . Lactose intolerance   . Leg edema   . Obesity   . Pedal edema 02/20/2015  . Prediabetes   . Preventative health care 09/25/2015  . PVC (premature ventricular contraction)     PAST SURGICAL HISTORY: Past Surgical History:  Procedure Laterality Date  . HYSTEROSCOPY N/A 10/04/2015   Procedure: HYSTEROSCOPY with Removal IUD;  Surgeon: Vanessa Kick, MD;  Location: [facility];  Service: Gynecology;  Laterality: N/A;  . lasik     eye surgery  . WISDOM TOOTH EXTRACTION      SOCIAL HISTORY:  Social History   Tobacco Use  . Smoking status: Former Smoker    Packs/day: 1.00    Years: 15.00    Pack years: 15.00    Quit date: 09/03/2001    Years since quitting: 17.5  . Smokeless tobacco: Never Used  Substance Use Topics  . Alcohol use: Yes    Comment: rare  . Drug use: No    FAMILY HISTORY: Family History  Problem Relation Age of Onset  . Heart attack Mother 95  . Hypertension Mother   . Heart disease Mother   . Obesity Mother   . Cancer Father        prostate  . Heart attack Sister 29  . Hypertension Other     ROS: Review of Systems  Constitutional: Positive for malaise/fatigue. Negative for weight loss.  Gastrointestinal: Negative for nausea and vomiting.  Musculoskeletal:       Negative muscle weakness  Endo/Heme/Allergies:       Negative hypoglycemia    PHYSICAL EXAM: Pt in no acute distress  RECENT LABS AND TESTS: BMET    Component Value Date/Time   NA 142 11/20/2018 0828   K 3.9 11/20/2018 0828   CL 101 11/20/2018 0828   CO2 25 11/20/2018 0828   GLUCOSE 98 11/20/2018 0828   GLUCOSE 86 03/13/2018 1152   BUN 10 11/20/2018 0828   CREATININE 0.73 11/20/2018 0828   CREATININE 0.72 02/03/2014 1648   CALCIUM 8.9 11/20/2018 0828   GFRNONAA 96 11/20/2018 0828   GFRAA 111 11/20/2018 0828   Lab Results  Component Value Date   HGBA1C 6.1 (H) 11/20/2018   HGBA1C 5.9 (H) 07/14/2018   HGBA1C 5.9 03/13/2018   HGBA1C 5.8 (H) 09/12/2017   HGBA1C 6.2 (H) 05/23/2017   Lab Results  Component Value Date   INSULIN 21.2 11/20/2018   INSULIN 15.4 07/14/2018   INSULIN 12.9 09/12/2017   INSULIN 31.1 (H) 05/23/2017   CBC    Component Value Date/Time   WBC 5.9 03/13/2018 1152   RBC 4.35 03/13/2018 1152   HGB 11.9 (L) 03/13/2018 1152   HGB 12.1 05/23/2017 1025   HCT 35.9 (L) 03/13/2018 1152   HCT 36.6 05/23/2017 1025   PLT 283.0 03/13/2018 1152   MCV 82.4 03/13/2018 1152   MCV 82 05/23/2017 1025   MCH 27.1 05/23/2017 1025   MCH 28.2 02/03/2014  1648  MCHC 33.2 03/13/2018 1152   RDW 15.0 03/13/2018 1152   RDW 14.9 05/23/2017 1025   LYMPHSABS 1.7 05/23/2017 1025   MONOABS 0.4 06/09/2014 0843   EOSABS 0.1 05/23/2017 1025   BASOSABS 0.0 05/23/2017 1025   Iron/TIBC/Ferritin/ %Sat No results found for: IRON, TIBC, FERRITIN, IRONPCTSAT Lipid Panel     Component Value Date/Time   CHOL 146 11/20/2018 0828   TRIG 89 11/20/2018 0828   HDL 35 (L) 11/20/2018 0828   CHOLHDL 3 03/13/2018 1152   VLDL 13.2 03/13/2018 1152   LDLCALC 93 11/20/2018 0828   Hepatic Function Panel     Component Value Date/Time   PROT 6.9 11/20/2018 0828   ALBUMIN 4.2 11/20/2018 0828   AST 9 11/20/2018 0828   ALT 11 11/20/2018 0828   ALKPHOS 80 11/20/2018 0828   BILITOT 0.3 11/20/2018 0828   BILIDIR 0.0 06/09/2014 0843   IBILI 0.1 (L) 02/03/2014 1648      Component Value Date/Time   TSH 2.10 03/13/2018 1152   TSH 1.710 05/23/2017 1025   TSH 1.26 03/15/2017 1140      I, Trixie Dredge, am acting as Location manager for Ilene Qua, MD  I have reviewed the above documentation for accuracy and completeness, and I agree with the above. - Ilene Qua, MD

## 2019-03-26 MED FILL — POTASSIUM CHLORIDE CRYS ER: 20 | 90 days supply | Qty: 270 | Fill #0

## 2019-04-06 ENCOUNTER — Other Ambulatory Visit: Payer: Self-pay

## 2019-04-06 ENCOUNTER — Ambulatory Visit (INDEPENDENT_AMBULATORY_CARE_PROVIDER_SITE_OTHER): Payer: BC Managed Care – PPO | Admitting: Family Medicine

## 2019-04-06 ENCOUNTER — Encounter (INDEPENDENT_AMBULATORY_CARE_PROVIDER_SITE_OTHER): Payer: Self-pay | Admitting: Family Medicine

## 2019-04-06 VITALS — BP 157/87 | HR 105 | Temp 98.4°F | Ht 69.0 in | Wt 318.0 lb

## 2019-04-06 DIAGNOSIS — Z6841 Body Mass Index (BMI) 40.0 and over, adult: Secondary | ICD-10-CM | POA: Diagnosis not present

## 2019-04-06 DIAGNOSIS — F3289 Other specified depressive episodes: Secondary | ICD-10-CM

## 2019-04-06 DIAGNOSIS — I10 Essential (primary) hypertension: Secondary | ICD-10-CM

## 2019-04-06 NOTE — Progress Notes (Signed)
Office: [phone]  /  Fax: [phone]   HPI:   Chief Complaint: OBESITY Brandi Lester is here to discuss her progress with her obesity treatment plan. She is on the keep a food journal with 1450-1600 calories and 100+ grams of protein daily and is following her eating plan approximately 50 % of the time. She states she is exercising with Sweating [employer] the [employer] and power walking for 30 minutes 5-7 times per week. Brandi Lester had a very dificult few weeks. Her brother passed away last week very unexpectedly after a colonoscopy. She has been doing the slide show for the funeral arrangements. She did do some stress eating and voices she is concerned she will gain when she goes away.  Her weight is (!) 318 lb (144.2 kg) today and has had a weight loss of 2 pounds over a period of 3 weeks since her last visit. She has lost 7 lbs since starting treatment with [name].  Hypertension Brandi Lester is a 53 y.o. female with hypertension. Brandi Lester blood pressure is elevated today, but she is very stressed and emotional. She denies chest pain, chest pressure, or headaches. She is working on weight loss to help control her blood pressure with the goal of decreasing her risk of heart attack and stroke.   Depression with Emotional Eating Behaviors Brandi Lester notes her symptoms are better controlled with Wellbutrin. Brandi Lester struggles with emotional eating and using food for comfort to the extent that it is negatively impacting her health. She often snacks when she is not hungry. Brandi Lester sometimes feels she is out of control and then feels guilty that she made poor food choices. She has been working on behavior modification techniques to help reduce her emotional eating and has been somewhat successful. She shows no sign of suicidal or homicidal ideations.  Depression screen Brandi Lester 2/9 05/23/2017 07/03/2016 04/13/2016  Decreased Interest 2 0 0  Down, Depressed, Hopeless 1 0 0  PHQ - 2 Score 3 0 0  Altered sleeping 1 - -   Tired, decreased energy 1 - -  Change in appetite 1 - -  Feeling bad or failure about yourself  0 - -  Trouble concentrating 1 - -  Moving slowly or fidgety/restless 0 - -  Suicidal thoughts 0 - -  PHQ-9 Score 7 - -  Difficult doing work/chores Not difficult at all - -    ASSESSMENT AND PLAN:  Essential hypertension  Other depression - with emotional eating   Class 3 severe obesity with serious comorbidity and body mass index (BMI) of 45.0 to 49.9 in adult, unspecified obesity type ([facility])  PLAN:  Hypertension We discussed sodium restriction, working on healthy weight loss, and a regular exercise program as the means to achieve improved blood pressure control. Stephenie agreed with this plan and agreed to follow up as directed. We will continue to monitor her blood pressure as well as her progress with the above lifestyle modifications. Brandi Lester agrees to continue her current medications and will watch for signs of hypotension as she continues her lifestyle modifications. We will follow up on blood pressure at her next appointment. Brandi Lester agrees to follow up with our clinic in 2 weeks.  Depression with Emotional Eating Behaviors We discussed behavior modification techniques today to help Brandi Lester deal with her emotional eating and depression. Brandi Lester agrees to continue taking Wellbutrin, and she agrees to follow up with our clinic in 2 weeks.  Obesity Brandi Lester is currently in the action stage of change. As such, her  goal is to continue with weight loss efforts She has agreed to keep a food journal with 1450-1600 calories and 100+ grams of protein daily Brandi Lester has been instructed to work up to a goal of 150 minutes of combined cardio and strengthening exercise per week for weight loss and overall health benefits. We discussed the following Behavioral Modification Strategies today: increasing lean protein intake, increasing vegetables and work on meal planning and easy cooking plans, keeping  healthy foods in the home, and planning for success   Brandi Lester has agreed to follow up with our clinic in 2 weeks. She was informed of the importance of frequent follow up visits to maximize her success with intensive lifestyle modifications for her multiple health conditions.  ALLERGIES: Allergies  Allergen Reactions  . Penicillins Itching and Swelling    Has patient had a PCN reaction causing immediate rash, facial/tongue/throat swelling, SOB or lightheadedness with hypotension: yes Has patient had a PCN reaction causing severe rash involving mucus membranes or skin necrosis: no Has patient had a PCN reaction that required hospitalization no Has patient had a PCN reaction occurring within the last 10 years: yes If all of the above answers are "NO", then may proceed with Cephalosporin use.   Brandi Lester [Liraglutide]     Vomiting    MEDICATIONS: Current Outpatient Medications on File Prior to Visit  Medication Sig Dispense Refill  . acetaminophen (TYLENOL 8 HOUR) 650 MG CR tablet Take 1 tablet (650 mg total) every 8 (eight) hours as needed by mouth for pain.    [name] amLODipine (NORVASC) 10 MG tablet Take 1 tablet (10 mg total) by mouth daily. 90 tablet 1  . azelastine (ASTELIN) 0.1 % nasal spray Place 2 sprays into both nostrils 2 (two) times daily. Use in each nostril as directed 90 mL 1  . azithromycin (ZITHROMAX) 250 MG tablet 2 tabs po once then 1 tab po daily x 4 days 6 tablet 0  . buPROPion (WELLBUTRIN SR) 200 MG 12 hr tablet Take 1 tablet (200 mg total) by mouth daily. 90 tablet 0  . EPINEPHrine (EPIPEN 2-PAK) 0.3 mg/0.3 mL IJ SOAJ injection Use as directed for severe allergic reaction 2 each 1  . fexofenadine (ALLEGRA ALLERGY) 180 MG tablet Take 1 tablet (180 mg total) by mouth daily. 301 tablet 1  . fluticasone (FLONASE) 50 MCG/ACT nasal spray Place 2 sprays into both nostrils daily as needed for allergies. 48 g 1  . glucose blood test strip OneTouch Ultra Test strips    .  hydrochlorothiazide (HYDRODIURIL) 25 MG tablet Take 1 tablet (25 mg total) by mouth daily. 90 tablet 1  . ibuprofen (ADVIL,MOTRIN) 600 MG tablet Take 1 tablet (600 mg total) by mouth every 6 (six) hours as needed. 90 tablet 0  . losartan (COZAAR) 100 MG tablet Take 1 tablet (100 mg total) by mouth at bedtime. 90 tablet 1  . medroxyPROGESTERone (PROVERA) 10 MG tablet   6  . metFORMIN (GLUCOPHAGE) 500 MG tablet Take 1 tablet (500 mg total) by mouth 2 (two) times daily with a meal. 60 tablet 0  . montelukast (SINGULAIR) 10 MG tablet Take 1 tablet (10 mg total) by mouth at bedtime as needed. 90 tablet 1  . Omega-3 Fatty Acids (FISH OIL PO) Take 1 capsule by mouth daily.    Brandi Lester DELICA LANCETS 27C MISC OneTouch Delica Lancets 33 gauge    . pantoprazole (PROTONIX) 40 MG tablet Take 1 tablet (40 mg total) by mouth daily. 90 tablet 1  .  potassium chloride SA (K-DUR) 20 MEQ tablet Take 1 tablet (20 mEq total) by mouth 3 (three) times daily. 270 tablet 1  . Probiotic Product (PROBIOTIC-10 PO) Take by mouth daily.    . TURMERIC PO Take 1 capsule by mouth daily.     No current facility-administered medications on file prior to visit.     PAST MEDICAL HISTORY: Past Medical History:  Diagnosis Date  . ADD (attention deficit disorder)   . Allergy    allergic rhinitis  . Anemia 07/25/2013  . Angio-edema   . Back pain   . Costochondritis 02/20/2015  . Diabetes mellitus    Type II   previously 3 years ago - no longer on meds   . Dyslipidemia 07/25/2013  . Fungus infection 10/13   Left great toe  . GERD (gastroesophageal reflux disease)   . Hypertension   . IBS (irritable bowel syndrome)   . Ingrown nail 10/13   right foot next to the last toe  . Insomnia 04/20/2013  . Joint pain   . Lactose intolerance   . Leg edema   . Obesity   . Pedal edema 02/20/2015  . Prediabetes   . Preventative health care 09/25/2015  . PVC (premature ventricular contraction)     PAST SURGICAL HISTORY: Past  Surgical History:  Procedure Laterality Date  . HYSTEROSCOPY N/A 10/04/2015   Procedure: HYSTEROSCOPY with Removal IUD;  Surgeon: Vanessa Kick, MD;  Location: [facility];  Service: Gynecology;  Laterality: N/A;  . lasik     eye surgery  . WISDOM TOOTH EXTRACTION      SOCIAL HISTORY: Social History   Tobacco Use  . Smoking status: Former Smoker    Packs/day: 1.00    Years: 15.00    Pack years: 15.00    Quit date: 09/03/2001    Years since quitting: 17.6  . Smokeless tobacco: Never Used  Substance Use Topics  . Alcohol use: Yes    Comment: rare  . Drug use: No    FAMILY HISTORY: Family History  Problem Relation Age of Onset  . Heart attack Mother 83  . Hypertension Mother   . Heart disease Mother   . Obesity Mother   . Cancer Father        prostate  . Heart attack Sister 29  . Hypertension Other     ROS: Review of Systems  Constitutional: Positive for weight loss.  Cardiovascular: Negative for chest pain.       Negative chest pressure  Neurological: Negative for headaches.  Psychiatric/Behavioral: Positive for depression. Negative for suicidal ideas.    PHYSICAL EXAM: Blood pressure (!) 157/87, pulse (!) 105, temperature 98.4 F (36.9 C), temperature source Oral, height 5\' 9"  (1.753 m), weight (!) 318 lb (144.2 kg), SpO2 97 %. Body mass index is 46.96 kg/m. Physical Exam Vitals signs reviewed.  Constitutional:      Appearance: Normal appearance. She is obese.  Cardiovascular:     Rate and Rhythm: Normal rate.     Pulses: Normal pulses.  Pulmonary:     Effort: Pulmonary effort is normal.     Breath sounds: Normal breath sounds.  Musculoskeletal: Normal range of motion.  Skin:    General: Skin is warm and dry.  Neurological:     Mental Status: She is alert and oriented to person, place, and time.  Psychiatric:        Mood and Affect: Mood normal.        Behavior: Behavior normal.  RECENT LABS AND TESTS: BMET    Component Value Date/Time   NA 142  11/20/2018 0828   K 3.9 11/20/2018 0828   CL 101 11/20/2018 0828   CO2 25 11/20/2018 0828   GLUCOSE 98 11/20/2018 0828   GLUCOSE 86 03/13/2018 1152   BUN 10 11/20/2018 0828   CREATININE 0.73 11/20/2018 0828   CREATININE 0.72 02/03/2014 1648   CALCIUM 8.9 11/20/2018 0828   GFRNONAA 96 11/20/2018 0828   GFRAA 111 11/20/2018 0828   Lab Results  Component Value Date   HGBA1C 6.1 (H) 11/20/2018   HGBA1C 5.9 (H) 07/14/2018   HGBA1C 5.9 03/13/2018   HGBA1C 5.8 (H) 09/12/2017   HGBA1C 6.2 (H) 05/23/2017   Lab Results  Component Value Date   INSULIN 21.2 11/20/2018   INSULIN 15.4 07/14/2018   INSULIN 12.9 09/12/2017   INSULIN 31.1 (H) 05/23/2017   CBC    Component Value Date/Time   WBC 5.9 03/13/2018 1152   RBC 4.35 03/13/2018 1152   HGB 11.9 (L) 03/13/2018 1152   HGB 12.1 05/23/2017 1025   HCT 35.9 (L) 03/13/2018 1152   HCT 36.6 05/23/2017 1025   PLT 283.0 03/13/2018 1152   MCV 82.4 03/13/2018 1152   MCV 82 05/23/2017 1025   MCH 27.1 05/23/2017 1025   MCH 28.2 02/03/2014 1648   MCHC 33.2 03/13/2018 1152   RDW 15.0 03/13/2018 1152   RDW 14.9 05/23/2017 1025   LYMPHSABS 1.7 05/23/2017 1025   MONOABS 0.4 06/09/2014 0843   EOSABS 0.1 05/23/2017 1025   BASOSABS 0.0 05/23/2017 1025   Iron/TIBC/Ferritin/ %Sat No results found for: IRON, TIBC, FERRITIN, IRONPCTSAT Lipid Panel     Component Value Date/Time   CHOL 146 11/20/2018 0828   TRIG 89 11/20/2018 0828   HDL 35 (L) 11/20/2018 0828   CHOLHDL 3 03/13/2018 1152   VLDL 13.2 03/13/2018 1152   LDLCALC 93 11/20/2018 0828   Hepatic Function Panel     Component Value Date/Time   PROT 6.9 11/20/2018 0828   ALBUMIN 4.2 11/20/2018 0828   AST 9 11/20/2018 0828   ALT 11 11/20/2018 0828   ALKPHOS 80 11/20/2018 0828   BILITOT 0.3 11/20/2018 0828   BILIDIR 0.0 06/09/2014 0843   IBILI 0.1 (L) 02/03/2014 1648      Component Value Date/Time   TSH 2.10 03/13/2018 1152   TSH 1.710 05/23/2017 1025   TSH 1.26 03/15/2017  1140      OBESITY BEHAVIORAL INTERVENTION VISIT  Today's visit was # 40   Starting weight: 325 lbs Starting date: 05/23/17 Today's weight : 318 lbs Today's date: 04/06/2019 Total lbs lost to date: 7    ASK: We discussed the diagnosis of obesity with Brandi Lester today and Brandi Lester agreed to give [name] permission to discuss obesity behavioral modification therapy today.  ASSESS: Brandi Lester has the diagnosis of obesity and her BMI today is 46.94 Trenice is in the action stage of change   ADVISE: Brandi Lester was educated on the multiple health risks of obesity as well as the benefit of weight loss to improve her health. She was advised of the need for long term treatment and the importance of lifestyle modifications to improve her current health and to decrease her risk of future health problems.  AGREE: Multiple dietary modification options and treatment options were discussed and  Brandi Lester agreed to follow the recommendations documented in the above note.  ARRANGE: Brandi Lester was educated on the importance of frequent visits to treat obesity as outlined per  CMS and USPSTF guidelines and agreed to schedule her next follow up appointment today.  I, Brandi Lester Dredge, am acting as transcriptionist for Ilene Qua, MD  I have reviewed the above documentation for accuracy and completeness, and I agree with the above. - Ilene Qua, MD

## 2019-04-10 ENCOUNTER — Ambulatory Visit: Payer: Self-pay

## 2019-04-10 ENCOUNTER — Encounter: Payer: Self-pay | Admitting: Family Medicine

## 2019-04-10 ENCOUNTER — Ambulatory Visit (HOSPITAL_BASED_OUTPATIENT_CLINIC_OR_DEPARTMENT_OTHER)
Admission: RE | Admit: 2019-04-10 | Discharge: 2019-04-10 | Disposition: A | Discharge status: Home / Self Care | Payer: BC Managed Care – PPO | Source: Ambulatory Visit | Attending: Family Medicine | Admitting: Family Medicine

## 2019-04-10 ENCOUNTER — Other Ambulatory Visit: Payer: Self-pay

## 2019-04-10 ENCOUNTER — Ambulatory Visit: Payer: BC Managed Care – PPO | Admitting: Family Medicine

## 2019-04-10 VITALS — BP 150/54 | HR 123 | Temp 99.3°F | Resp 18 | Ht 69.0 in | Wt 321.0 lb

## 2019-04-10 DIAGNOSIS — M79661 Pain in right lower leg: Secondary | ICD-10-CM

## 2019-04-10 NOTE — Telephone Encounter (Signed)
Appt scheduled

## 2019-04-10 NOTE — Progress Notes (Signed)
Patient ID: Brandi Lester, female    DOB: 1965-12-30  Age: 53 y.o. MRN: [id number]    Subjective:  Subjective  HPI Brandi Lester presents for R leg/ calf pain and is hot to touch.  She drove to [place] yesterday.  She was passenger in the car sitting in the back with not much room to move.  Pt denies sob, cp.    Review of Systems  Constitutional: Negative for activity change, appetite change, fatigue and unexpected weight change.  Respiratory: Negative for cough and shortness of breath.   Cardiovascular: Negative for chest pain and palpitations.  Psychiatric/Behavioral: Negative for behavioral problems and dysphoric mood. The patient is not nervous/anxious.     History Past Medical History:  Diagnosis Date   ADD (attention deficit disorder)    Allergy    allergic rhinitis   Anemia 07/25/2013   Angio-edema    Back pain    Costochondritis 02/20/2015   Diabetes mellitus    Type II   previously 3 years ago - no longer on meds    Dyslipidemia 07/25/2013   Fungus infection 10/13   Left great toe   GERD (gastroesophageal reflux disease)    Hypertension    IBS (irritable bowel syndrome)    Ingrown nail 10/13   right foot next to the last toe   Insomnia 04/20/2013   Joint pain    Lactose intolerance    Leg edema    Obesity    Pedal edema 02/20/2015   Prediabetes    Preventative health care 09/25/2015   PVC (premature ventricular contraction)     She has a past surgical history that includes lasik; Wisdom tooth extraction; and Hysteroscopy (N/A, 10/04/2015).   Her family history includes Cancer in her father; Heart attack (age of onset: 55) in her sister; Heart attack (age of onset: 56) in her mother; Heart disease in her mother; Hypertension in her mother and another family member; Obesity in her mother.She reports that she quit smoking about 17 years ago. She has a 15.00 pack-year smoking history. She has never used smokeless tobacco. She reports current  alcohol use. She reports that she does not use drugs.  Current Outpatient Medications on File Prior to Visit  Medication Sig Dispense Refill   acetaminophen (TYLENOL 8 HOUR) 650 MG CR tablet Take 1 tablet (650 mg total) every 8 (eight) hours as needed by mouth for pain.     amLODipine (NORVASC) 10 MG tablet Take 1 tablet (10 mg total) by mouth daily. 90 tablet 1   azelastine (ASTELIN) 0.1 % nasal spray Place 2 sprays into both nostrils 2 (two) times daily. Use in each nostril as directed 90 mL 1   buPROPion (WELLBUTRIN SR) 200 MG 12 hr tablet Take 1 tablet (200 mg total) by mouth daily. 90 tablet 0   EPINEPHrine (EPIPEN 2-PAK) 0.3 mg/0.3 mL IJ SOAJ injection Use as directed for severe allergic reaction 2 each 1   fexofenadine (ALLEGRA ALLERGY) 180 MG tablet Take 1 tablet (180 mg total) by mouth daily. 301 tablet 1   fluticasone (FLONASE) 50 MCG/ACT nasal spray Place 2 sprays into both nostrils daily as needed for allergies. 48 g 1   glucose blood test strip OneTouch Ultra Test strips     hydrochlorothiazide (HYDRODIURIL) 25 MG tablet Take 1 tablet (25 mg total) by mouth daily. 90 tablet 1   ibuprofen (ADVIL,MOTRIN) 600 MG tablet Take 1 tablet (600 mg total) by mouth every 6 (six) hours as needed. 90 tablet  0   losartan (COZAAR) 100 MG tablet Take 1 tablet (100 mg total) by mouth at bedtime. 90 tablet 1   medroxyPROGESTERone (PROVERA) 10 MG tablet   6   metFORMIN (GLUCOPHAGE) 500 MG tablet Take 1 tablet (500 mg total) by mouth 2 (two) times daily with a meal. 60 tablet 0   montelukast (SINGULAIR) 10 MG tablet Take 1 tablet (10 mg total) by mouth at bedtime as needed. 90 tablet 1   Omega-3 Fatty Acids (FISH OIL PO) Take 1 capsule by mouth daily.     ONETOUCH DELICA LANCETS 71I MISC OneTouch Delica Lancets 33 gauge     pantoprazole (PROTONIX) 40 MG tablet Take 1 tablet (40 mg total) by mouth daily. 90 tablet 1   potassium chloride SA (K-DUR) 20 MEQ tablet Take 1 tablet (20 mEq  total) by mouth 3 (three) times daily. 270 tablet 1   Probiotic Product (PROBIOTIC-10 PO) Take by mouth daily.     TURMERIC PO Take 1 capsule by mouth daily.     No current facility-administered medications on file prior to visit.      Objective:  Objective  Physical Exam Vitals signs and nursing note reviewed.  Constitutional:      Appearance: She is well-developed.  HENT:     Head: Normocephalic and atraumatic.  Eyes:     Conjunctiva/sclera: Conjunctivae normal.  Neck:     Musculoskeletal: Normal range of motion and neck supple.     Thyroid: No thyromegaly.     Vascular: No carotid bruit or JVD.  Cardiovascular:     Rate and Rhythm: Normal rate and regular rhythm.     Heart sounds: Normal heart sounds. No murmur.  Pulmonary:     Effort: Pulmonary effort is normal. No respiratory distress.     Breath sounds: Normal breath sounds. No wheezing or rales.  Chest:     Chest wall: No tenderness.  Musculoskeletal:        General: Tenderness present.     Right knee: Tenderness found.       Legs:  Neurological:     Mental Status: She is alert and oriented to person, place, and time.    BP (!) 150/54 (BP Location: Left Arm, Patient Position: Sitting, Cuff Size: Large)    Pulse (!) 123    Temp 99.3 F (37.4 C) (Oral)    Resp 18    Ht 5\' 9"  (1.753 m)    Wt (!) 321 lb (145.6 kg)    SpO2 100%    BMI 47.40 kg/m  Wt Readings from Last 3 Encounters:  04/10/19 (!) 321 lb (145.6 kg)  04/06/19 (!) 318 lb (144.2 kg)  03/03/19 (!) 320 lb (145.2 kg)     Lab Results  Component Value Date   WBC 5.9 03/13/2018   HGB 11.9 (L) 03/13/2018   HCT 35.9 (L) 03/13/2018   PLT 283.0 03/13/2018   GLUCOSE 98 11/20/2018   CHOL 146 11/20/2018   TRIG 89 11/20/2018   HDL 35 (L) 11/20/2018   LDLCALC 93 11/20/2018   ALT 11 11/20/2018   AST 9 11/20/2018   NA 142 11/20/2018   K 3.9 11/20/2018   CL 101 11/20/2018   CREATININE 0.73 11/20/2018   BUN 10 11/20/2018   CO2 25 11/20/2018   TSH 2.10  03/13/2018   HGBA1C 6.1 (H) 11/20/2018   MICROALBUR 0.50 12/15/2012    Mm 3d Screen Breast Bilateral  Result Date: 07/21/2018 CLINICAL DATA:  Screening. EXAM: DIGITAL SCREENING BILATERAL MAMMOGRAM  WITH TOMO AND CAD COMPARISON:  Previous exam(s). ACR Breast Density Category b: There are scattered areas of fibroglandular density. FINDINGS: There are no findings suspicious for malignancy. Images were processed with CAD. IMPRESSION: No mammographic evidence of malignancy. A result letter of this screening mammogram will be mailed directly to the patient. RECOMMENDATION: Screening mammogram in one year. (Code:SM-B-01Y) BI-RADS CATEGORY  1: Negative. Electronically Signed   By: Lovey Newcomer M.D.   On: 07/21/2018 16:32     Assessment & Plan:  Plan  I have discontinued Zyah T. Nou's azithromycin. I am also having her maintain her fexofenadine, TURMERIC PO, Omega-3 Fatty Acids (FISH OIL PO), ibuprofen, OneTouch Delica Lancets 20U, glucose blood, Probiotic Product (PROBIOTIC-10 PO), acetaminophen, medroxyPROGESTERone, buPROPion, EPINEPHrine, azelastine, fluticasone, pantoprazole, amLODipine, hydrochlorothiazide, losartan, potassium chloride SA, montelukast, and metFORMIN.  No orders of the defined types were placed in this encounter.   Problem List Items Addressed This Visit    None    Visit Diagnoses    Right calf pain    -  Primary   Relevant Orders   US Venous Img Lower Unilateral Right    use ice/ heat  Elevate leg Rest  [name] today   Follow-up: No follow-ups on file.  Ann Held, DO

## 2019-04-10 NOTE — Telephone Encounter (Signed)
Incoming call from Patient with a complaint of right leg discomfort.  .  Patient states tha she went to [facility] yesterday for a funeral .  States that they stop at least 2 times.  Patent also states that she was " kinda sitting on her right side". More the left.  Reports the site is warm to touch.  It is more of a discomfort than pain.. Transferred to office to make an appointment.         Reason for Disposition . [1] Thigh, calf, or ankle swelling AND [2] bilateral AND [3] 1 side is more swollen  Answer Assessment - Initial Assessment Questions 1. ONSET: "When did the pain start?"   yesterday after trip.   2. LOCATION: "Where is the pain located?"      Side front of right knee 3. PAIN: "How bad is the pain?"    (Scale 1-10; or mild, moderate, severe)   -  MILD (1-3): doesn't interfere with normal activities    -  MODERATE (4-7): interferes with normal activities (e.g., work or school) or awakens from sleep, limping    -  SEVERE (8-10): excruciating pain, unable to do any normal activities, unable to walk     Discomfort. mild 4. WORK OR EXERCISE: "Has there been any recent work or exercise that involved this part of the body?"       5. CAUSE: "What do you think is causing the leg pain?"     Right leg bent more to the right, than left 6. OTHER SYMPTOMS: "Do you have any other symptoms?" (e.g., chest pain, back pain, breathing difficulty, swelling, rash, fever, numbness, weakness)    A little swollen  7. PREGNANCY: "Is there any chance you are pregnant?" "When was your last menstrual period?"      Last week take progeston  Protocols used: LEG PAIN-A-AH

## 2019-04-10 NOTE — Patient Instructions (Signed)
Go downstairs to the 1st floor to radiology for an ultrasound of your R leg  Keep it elevated You can use ice / heat on it Call Monday if no better

## 2019-04-13 MED FILL — MEDROXYPROGESTERONE 10 MG T: 10 | 90 days supply | Qty: 90 | Fill #3

## 2019-04-20 ENCOUNTER — Ambulatory Visit (INDEPENDENT_AMBULATORY_CARE_PROVIDER_SITE_OTHER): Payer: BC Managed Care – PPO | Admitting: Family Medicine

## 2019-04-28 ENCOUNTER — Other Ambulatory Visit: Payer: Self-pay

## 2019-04-28 ENCOUNTER — Encounter (INDEPENDENT_AMBULATORY_CARE_PROVIDER_SITE_OTHER): Payer: Self-pay | Admitting: Family Medicine

## 2019-04-28 ENCOUNTER — Telehealth (INDEPENDENT_AMBULATORY_CARE_PROVIDER_SITE_OTHER): Payer: BC Managed Care – PPO | Admitting: Family Medicine

## 2019-04-28 DIAGNOSIS — E119 Type 2 diabetes mellitus without complications: Secondary | ICD-10-CM

## 2019-04-28 DIAGNOSIS — Z6841 Body Mass Index (BMI) 40.0 and over, adult: Secondary | ICD-10-CM | POA: Diagnosis not present

## 2019-04-28 NOTE — Progress Notes (Signed)
Office: [phone]  /  Fax: [phone] TeleHealth Visit:  KYASHA WISSEL has verbally consented to this TeleHealth visit today. The patient is located at home, the provider is located at the [facility] office. The participants in this visit include the listed provider and patient. The visit was conducted today via FaceTime.  HPI:   Chief Complaint: OBESITY Zarra is here to discuss her progress with her obesity treatment plan. She is on the Category 3 plan and is following her eating plan approximately 85% of the time. She states she is riding a bike 10 minutes 3 times a day and walking 30-45 minutes 4-5 times per week. Telly states she keeps calories under 1600 most days. She skips dinner at times and then eats during the middle of the night-usually something like a candy bar. She states she is not always meeting her protein goal.  We were unable to weigh the patient today for this TeleHealth visit. She feels as if she has maintained her weight since her last visit. She has lost 7 lbs since starting treatment with [name].  Diabetes Mellitus Krystiana has a diagnosis of diabetes mellitus, which is well controlled on metformin. Kayren does not report checking her blood sugars. She has had some episodes of hypoglycemia after a carb rich meal. Last A1c was 6.1 on 11/20/2018. She has been working on intensive lifestyle modifications including diet, exercise, and weight loss to help control her blood glucose levels.   ASSESSMENT AND PLAN:  Type 2 diabetes mellitus without complication, without long-term current use of insulin (HCC)  Class 3 severe obesity with serious comorbidity and body mass index (BMI) of 45.0 to 49.9 in adult, unspecified obesity type ([place])  PLAN:  Diabetes Mellitus Naliyah has been given extensive diabetes education by myself today including ideal fasting and post-prandial blood glucose readings, individual ideal HgA1c goals  and hypoglycemia prevention.  We discussed the importance of good blood sugar control to decrease the likelihood of diabetic complications such as nephropathy, neuropathy, limb loss, blindness, coronary artery disease, and death. We discussed the importance of intensive lifestyle modification including diet, exercise and weight loss as the first line treatment for diabetes. Kemyah will reduce simple carbs and will have A1c checked at her next visit. She will follow-up at the agreed upon time.  Obesity Syrianna is currently in the action stage of change. As such, her goal is to continue with weight loss efforts. She has agreed to keep a food journal with 1450-1600 calories and 100 grams of protein daily. Handouts were sent via MyChart on Breakfast Options. Zeriyah has been instructed to continue her current exercise regimen for weight loss and overall health benefits. We discussed the following Behavioral Modification Strategies today: increasing lean protein intake, decreasing simple carbohydrates, no skipping meals, planning for success, and keep a strict food journal.  Linsy has agreed to follow-up with our clinic in 2-3 weeks. She was informed of the importance of frequent follow-up visits to maximize her success with intensive lifestyle modifications for her multiple health conditions.  ALLERGIES: Allergies  Allergen Reactions  . Penicillins Itching and Swelling    Has patient had a PCN reaction causing immediate rash, facial/tongue/throat swelling, SOB or lightheadedness with hypotension: yes Has patient had a PCN reaction causing severe rash involving mucus membranes or skin necrosis: no Has patient had a PCN reaction that required hospitalization no Has patient had a PCN reaction occurring within the last 10 years: yes If all of the above answers  are "NO", then may proceed with Cephalosporin use.   [name] [Liraglutide]     Vomiting    MEDICATIONS: Current Outpatient Medications on File Prior to Visit   Medication Sig Dispense Refill  . acetaminophen (TYLENOL 8 HOUR) 650 MG CR tablet Take 1 tablet (650 mg total) every 8 (eight) hours as needed by mouth for pain.    [name] amLODipine (NORVASC) 10 MG tablet Take 1 tablet (10 mg total) by mouth daily. 90 tablet 1  . azelastine (ASTELIN) 0.1 % nasal spray Place 2 sprays into both nostrils 2 (two) times daily. Use in each nostril as directed 90 mL 1  . buPROPion (WELLBUTRIN SR) 200 MG 12 hr tablet Take 1 tablet (200 mg total) by mouth daily. 90 tablet 0  . EPINEPHrine (EPIPEN 2-PAK) 0.3 mg/0.3 mL IJ SOAJ injection Use as directed for severe allergic reaction 2 each 1  . fexofenadine (ALLEGRA ALLERGY) 180 MG tablet Take 1 tablet (180 mg total) by mouth daily. 301 tablet 1  . fluticasone (FLONASE) 50 MCG/ACT nasal spray Place 2 sprays into both nostrils daily as needed for allergies. 48 g 1  . glucose blood test strip OneTouch Ultra Test strips    . hydrochlorothiazide (HYDRODIURIL) 25 MG tablet Take 1 tablet (25 mg total) by mouth daily. 90 tablet 1  . ibuprofen (ADVIL,MOTRIN) 600 MG tablet Take 1 tablet (600 mg total) by mouth every 6 (six) hours as needed. 90 tablet 0  . losartan (COZAAR) 100 MG tablet Take 1 tablet (100 mg total) by mouth at bedtime. 90 tablet 1  . medroxyPROGESTERone (PROVERA) 10 MG tablet   6  . metFORMIN (GLUCOPHAGE) 500 MG tablet Take 1 tablet (500 mg total) by mouth 2 (two) times daily with a meal. 60 tablet 0  . montelukast (SINGULAIR) 10 MG tablet Take 1 tablet (10 mg total) by mouth at bedtime as needed. 90 tablet 1  . Omega-3 Fatty Acids (FISH OIL PO) Take 1 capsule by mouth daily.    [name] DELICA LANCETS [id number] MISC OneTouch Delica Lancets 33 gauge    . pantoprazole (PROTONIX) 40 MG tablet Take 1 tablet (40 mg total) by mouth daily. 90 tablet 1  . potassium chloride SA (K-DUR) 20 MEQ tablet Take 1 tablet (20 mEq total) by mouth 3 (three) times daily. 270 tablet 1  . Probiotic Product (PROBIOTIC-10 PO) Take by mouth daily.     . TURMERIC PO Take 1 capsule by mouth daily.     No current facility-administered medications on file prior to visit.     PAST MEDICAL HISTORY: Past Medical History:  Diagnosis Date  . ADD (attention deficit disorder)   . Allergy    allergic rhinitis  . Anemia 07/25/2013  . Angio-edema   . Back pain   . Costochondritis 02/20/2015  . Diabetes mellitus    Type II   previously 3 years ago - no longer on meds   . Dyslipidemia 07/25/2013  . Fungus infection 10/13   Left great toe  . GERD (gastroesophageal reflux disease)   . Hypertension   . IBS (irritable bowel syndrome)   . Ingrown nail 10/13   right foot next to the last toe  . Insomnia 04/20/2013  . Joint pain   . Lactose intolerance   . Leg edema   . Obesity   . Pedal edema 02/20/2015  . Prediabetes   . Preventative health care 09/25/2015  . PVC (premature ventricular contraction)     PAST SURGICAL HISTORY: Past  Surgical History:  Procedure Laterality Date  . HYSTEROSCOPY N/A 10/04/2015   Procedure: HYSTEROSCOPY with Removal IUD;  Surgeon: Vanessa Kick, MD;  Location: [facility];  Service: Gynecology;  Laterality: N/A;  . lasik     eye surgery  . WISDOM TOOTH EXTRACTION      SOCIAL HISTORY: Social History   Tobacco Use  . Smoking status: Former Smoker    Packs/day: 1.00    Years: 15.00    Pack years: 15.00    Quit date: 09/03/2001    Years since quitting: 17.6  . Smokeless tobacco: Never Used  Substance Use Topics  . Alcohol use: Yes    Comment: rare  . Drug use: No    FAMILY HISTORY: Family History  Problem Relation Age of Onset  . Heart attack Mother 48  . Hypertension Mother   . Heart disease Mother   . Obesity Mother   . Cancer Father        prostate  . Heart attack Sister 76  . Hypertension Other    ROS: Review of Systems  Endo/Heme/Allergies:       Positive for episodes of hypoglycemia following a carb rich meal.   PHYSICAL EXAM: Pt in no acute distress  RECENT LABS AND TESTS: BMET     Component Value Date/Time   NA 142 11/20/2018 0828   K 3.9 11/20/2018 0828   CL 101 11/20/2018 0828   CO2 25 11/20/2018 0828   GLUCOSE 98 11/20/2018 0828   GLUCOSE 86 03/13/2018 1152   BUN 10 11/20/2018 0828   CREATININE 0.73 11/20/2018 0828   CREATININE 0.72 02/03/2014 1648   CALCIUM 8.9 11/20/2018 0828   GFRNONAA 96 11/20/2018 0828   GFRAA 111 11/20/2018 0828   Lab Results  Component Value Date   HGBA1C 6.1 (H) 11/20/2018   HGBA1C 5.9 (H) 07/14/2018   HGBA1C 5.9 03/13/2018   HGBA1C 5.8 (H) 09/12/2017   HGBA1C 6.2 (H) 05/23/2017   Lab Results  Component Value Date   INSULIN 21.2 11/20/2018   INSULIN 15.4 07/14/2018   INSULIN 12.9 09/12/2017   INSULIN 31.1 (H) 05/23/2017   CBC    Component Value Date/Time   WBC 5.9 03/13/2018 1152   RBC 4.35 03/13/2018 1152   HGB 11.9 (L) 03/13/2018 1152   HGB 12.1 05/23/2017 1025   HCT 35.9 (L) 03/13/2018 1152   HCT 36.6 05/23/2017 1025   PLT 283.0 03/13/2018 1152   MCV 82.4 03/13/2018 1152   MCV 82 05/23/2017 1025   MCH 27.1 05/23/2017 1025   MCH 28.2 02/03/2014 1648   MCHC 33.2 03/13/2018 1152   RDW 15.0 03/13/2018 1152   RDW 14.9 05/23/2017 1025   LYMPHSABS 1.7 05/23/2017 1025   MONOABS 0.4 06/09/2014 0843   EOSABS 0.1 05/23/2017 1025   BASOSABS 0.0 05/23/2017 1025   Iron/TIBC/Ferritin/ %Sat No results found for: IRON, TIBC, FERRITIN, IRONPCTSAT Lipid Panel     Component Value Date/Time   CHOL 146 11/20/2018 0828   TRIG 89 11/20/2018 0828   HDL 35 (L) 11/20/2018 0828   CHOLHDL 3 03/13/2018 1152   VLDL 13.2 03/13/2018 1152   LDLCALC 93 11/20/2018 0828   Hepatic Function Panel     Component Value Date/Time   PROT 6.9 11/20/2018 0828   ALBUMIN 4.2 11/20/2018 0828   AST 9 11/20/2018 0828   ALT 11 11/20/2018 0828   ALKPHOS 80 11/20/2018 0828   BILITOT 0.3 11/20/2018 0828   BILIDIR 0.0 06/09/2014 0843   IBILI 0.1 (L) 02/03/2014 1648  Component Value Date/Time   TSH 2.10 03/13/2018 1152   TSH 1.710  05/23/2017 1025   TSH 1.26 03/15/2017 1140   Results for CONSANDRA, BRANUM (MRN [id number]) as of 04/28/2019 14:46  Ref. Range 11/20/2018 08:28  Vitamin D, 25-Hydroxy Latest Ref Range: 30.0 - 100.0 ng/mL 47.9   I, Michaelene Song, am acting as Location manager for [employer], FNP  I have reviewed the above documentation for accuracy and completeness, and I agree with the above.  - Marilouise Densmore, FNP-C.

## 2019-05-18 ENCOUNTER — Encounter (INDEPENDENT_AMBULATORY_CARE_PROVIDER_SITE_OTHER): Payer: Self-pay

## 2019-05-20 ENCOUNTER — Encounter (INDEPENDENT_AMBULATORY_CARE_PROVIDER_SITE_OTHER): Payer: Self-pay | Admitting: Family Medicine

## 2019-05-20 ENCOUNTER — Ambulatory Visit (INDEPENDENT_AMBULATORY_CARE_PROVIDER_SITE_OTHER): Payer: BC Managed Care – PPO | Admitting: Family Medicine

## 2019-05-20 ENCOUNTER — Other Ambulatory Visit: Payer: Self-pay

## 2019-05-20 ENCOUNTER — Encounter: Payer: Self-pay | Admitting: Family Medicine

## 2019-05-20 VITALS — BP 137/81 | HR 111 | Temp 98.5°F | Ht 69.0 in | Wt 317.6 lb

## 2019-05-20 DIAGNOSIS — F3289 Other specified depressive episodes: Secondary | ICD-10-CM | POA: Diagnosis not present

## 2019-05-20 DIAGNOSIS — Z9189 Other specified personal risk factors, not elsewhere classified: Secondary | ICD-10-CM | POA: Diagnosis not present

## 2019-05-20 DIAGNOSIS — Z6841 Body Mass Index (BMI) 40.0 and over, adult: Secondary | ICD-10-CM

## 2019-05-20 DIAGNOSIS — E559 Vitamin D deficiency, unspecified: Secondary | ICD-10-CM | POA: Diagnosis not present

## 2019-05-20 MED ORDER — BUPROPION HCL ER (SR) 200 MG PO TB12: 200.0000 mg | ORAL_TABLET | Freq: Every day | ORAL | 0 refills | Status: DC

## 2019-05-20 MED FILL — BUPROPION HCL SR 200 MG TAB: 200 | 90 days supply | Qty: 90 | Fill #0

## 2019-05-25 NOTE — Progress Notes (Signed)
Office: [phone]  /  Fax: [phone]   HPI:   Chief Complaint: OBESITY Brandi Lester is here to discuss her progress with her obesity treatment plan. She is journaling 1500 calories and following the Category 3 plan and she is following her eating plan approximately 75 % of the time. She states she is exercising 10 to 30 minutes 4 to 5 times per week. Brandi Lester is doing a hybrid of Category 3 and journaling. She would like to try Revival soy products. Brandi Lester does not eat lunch on Tuesdays because she does not feel comfortable eating at school due to [place]. Her weight is (!) 317 lb 9.6 oz (144.1 kg) today and has had a weight loss of 1 pound since her last in-office visit. She has lost 8 lbs since starting treatment with [name].  Vitamin D deficiency Brandi Lester has a diagnosis of vitamin D deficiency. She is nearly at goal (47.9 on 11/20/18) on OTC vit D. Clarence denies nausea, vomiting or muscle weakness.  At risk for osteopenia and osteoporosis Brandi Lester is at higher risk of osteopenia and osteoporosis due to vitamin D deficiency.   Depression with emotional eating behaviors Brandi Lester is on Bupropion and she reports it is helping with cravings. She struggles with emotional eating and using food for comfort to the extent that it is negatively impacting her health. She often snacks when she is not hungry. Brandi Lester sometimes feels she is out of control and then feels guilty that she made poor food choices. She has been working on behavior modification techniques to help reduce her emotional eating and has been somewhat successful. Her mood is stable. She shows no sign of suicidal or homicidal ideations.  ASSESSMENT AND PLAN:  Vitamin D deficiency  Other depression - with emotional eating - Plan: buPROPion (WELLBUTRIN SR) 200 MG 12 hr tablet  At risk for osteoporosis  Class 3 severe obesity with serious comorbidity and body mass index (BMI) of 45.0 to 49.9 in adult, unspecified obesity type (Success)   PLAN:  Vitamin D Deficiency Huberta was informed that low vitamin D levels contributes to fatigue and are associated with obesity, breast, and colon cancer. Shellie will continue to take OTC vitamin D and she will follow up for routine testing of vitamin D, at least 2-3 times per year. She was informed of the risk of over-replacement of vitamin D and agrees to not increase her dose unless she discusses this with [name] first. We will check vitamin D level at the next visit.  At risk for osteopenia and osteoporosis Adriane was given extended  (15 minutes) osteoporosis prevention counseling today. Keoshia is at risk for osteopenia and osteoporosis due to her vitamin D deficiency. She was encouraged to take her vitamin D and follow her higher calcium diet and increase strengthening exercise to help strengthen her bones and decrease her risk of osteopenia and osteoporosis.  Depression with Emotional Eating Behaviors We discussed behavior modification techniques today to help Brandi Lester deal with her emotional eating and depression. She has agreed to continue Bupropion SR 200 mg daily #90 with no refills and follow up as directed.  Obesity Brandi Lester is currently in the action stage of change. As such, her goal is to continue with weight loss efforts She has agreed to keep a food journal with 1500 calories and 100 grams of protein daily Brandi Lester will continue her current exercise regimen for weight loss and overall health benefits. We discussed the following Behavioral Modification Strategies today: planning for success and increasing lean protein  intake  Brandi Lester may drink Premier protein shake for lunch on Tuesday. She may incorporate Revival products into the day with journaling.  Brandi Lester has agreed to follow up with our clinic in 3 weeks. She was informed of the importance of frequent follow up visits to maximize her success with intensive lifestyle modifications for her multiple health conditions.   ALLERGIES: Allergies  Allergen Reactions  . Penicillins Itching and Swelling    Has patient had a PCN reaction causing immediate rash, facial/tongue/throat swelling, SOB or lightheadedness with hypotension: yes Has patient had a PCN reaction causing severe rash involving mucus membranes or skin necrosis: no Has patient had a PCN reaction that required hospitalization no Has patient had a PCN reaction occurring within the last 10 years: yes If all of the above answers are "NO", then may proceed with Cephalosporin use.   Brandi Lester [Liraglutide]     Vomiting    MEDICATIONS: Current Outpatient Medications on File Prior to Visit  Medication Sig Dispense Refill  . acetaminophen (TYLENOL 8 HOUR) 650 MG CR tablet Take 1 tablet (650 mg total) every 8 (eight) hours as needed by mouth for pain.    [name] amLODipine (NORVASC) 10 MG tablet Take 1 tablet (10 mg total) by mouth daily. 90 tablet 1  . azelastine (ASTELIN) 0.1 % nasal spray Place 2 sprays into both nostrils 2 (two) times daily. Use in each nostril as directed 90 mL 1  . EPINEPHrine (EPIPEN 2-PAK) 0.3 mg/0.3 mL IJ SOAJ injection Use as directed for severe allergic reaction 2 each 1  . fexofenadine (ALLEGRA ALLERGY) 180 MG tablet Take 1 tablet (180 mg total) by mouth daily. 301 tablet 1  . fluticasone (FLONASE) 50 MCG/ACT nasal spray Place 2 sprays into both nostrils daily as needed for allergies. 48 g 1  . glucose blood test strip OneTouch Ultra Test strips    . hydrochlorothiazide (HYDRODIURIL) 25 MG tablet Take 1 tablet (25 mg total) by mouth daily. 90 tablet 1  . ibuprofen (ADVIL,MOTRIN) 600 MG tablet Take 1 tablet (600 mg total) by mouth every 6 (six) hours as needed. 90 tablet 0  . losartan (COZAAR) 100 MG tablet Take 1 tablet (100 mg total) by mouth at bedtime. 90 tablet 1  . medroxyPROGESTERone (PROVERA) 10 MG tablet   6  . metFORMIN (GLUCOPHAGE) 500 MG tablet Take 1 tablet (500 mg total) by mouth 2 (two) times daily with a meal. 60  tablet 0  . montelukast (SINGULAIR) 10 MG tablet Take 1 tablet (10 mg total) by mouth at bedtime as needed. 90 tablet 1  . Omega-3 Fatty Acids (FISH OIL PO) Take 1 capsule by mouth daily.    [name] DELICA LANCETS [id number] MISC OneTouch Delica Lancets 33 gauge    . pantoprazole (PROTONIX) 40 MG tablet Take 1 tablet (40 mg total) by mouth daily. 90 tablet 1  . potassium chloride SA (K-DUR) 20 MEQ tablet Take 1 tablet (20 mEq total) by mouth 3 (three) times daily. 270 tablet 1  . Probiotic Product (PROBIOTIC-10 PO) Take by mouth daily.    . TURMERIC PO Take 1 capsule by mouth daily.     No current facility-administered medications on file prior to visit.     PAST MEDICAL HISTORY: Past Medical History:  Diagnosis Date  . ADD (attention deficit disorder)   . Allergy    allergic rhinitis  . Anemia 07/25/2013  . Angio-edema   . Back pain   . Costochondritis 02/20/2015  . Diabetes mellitus  Type II   previously 3 years ago - no longer on meds   . Dyslipidemia 07/25/2013  . Fungus infection 10/13   Left great toe  . GERD (gastroesophageal reflux disease)   . Hypertension   . IBS (irritable bowel syndrome)   . Ingrown nail 10/13   right foot next to the last toe  . Insomnia 04/20/2013  . Joint pain   . Lactose intolerance   . Leg edema   . Obesity   . Pedal edema 02/20/2015  . Prediabetes   . Preventative health care 09/25/2015  . PVC (premature ventricular contraction)     PAST SURGICAL HISTORY: Past Surgical History:  Procedure Laterality Date  . HYSTEROSCOPY N/A 10/04/2015   Procedure: HYSTEROSCOPY with Removal IUD;  Surgeon: Vanessa Kick, MD;  Location: [facility];  Service: Gynecology;  Laterality: N/A;  . lasik     eye surgery  . WISDOM TOOTH EXTRACTION      SOCIAL HISTORY: Social History   Tobacco Use  . Smoking status: Former Smoker    Packs/day: 1.00    Years: 15.00    Pack years: 15.00    Quit date: 09/03/2001    Years since quitting: 17.7  . Smokeless tobacco:  Never Used  Substance Use Topics  . Alcohol use: Yes    Comment: rare  . Drug use: No    FAMILY HISTORY: Family History  Problem Relation Age of Onset  . Heart attack Mother 97  . Hypertension Mother   . Heart disease Mother   . Obesity Mother   . Cancer Father        prostate  . Heart attack Sister 52  . Hypertension Other     ROS: Review of Systems  Constitutional: Positive for weight loss.  Gastrointestinal: Negative for nausea and vomiting.  Musculoskeletal:       Negative for muscle weakness  Endo/Heme/Allergies:       Positive for cravings  Psychiatric/Behavioral: Positive for depression. Negative for suicidal ideas.    PHYSICAL EXAM: Blood pressure 137/81, pulse (!) 111, temperature 98.5 F (36.9 C), height 5\' 9"  (1.753 m), weight (!) 317 lb 9.6 oz (144.1 kg), SpO2 99 %. Body mass index is 46.9 kg/m. Physical Exam Vitals signs reviewed.  Constitutional:      Appearance: Normal appearance. She is well-developed. She is obese.  Cardiovascular:     Rate and Rhythm: Normal rate.  Pulmonary:     Effort: Pulmonary effort is normal.  Musculoskeletal: Normal range of motion.  Skin:    General: Skin is warm and dry.  Neurological:     Mental Status: She is alert and oriented to person, place, and time.  Psychiatric:        Mood and Affect: Mood normal.        Behavior: Behavior normal.     RECENT LABS AND TESTS: BMET    Component Value Date/Time   NA 142 11/20/2018 0828   K 3.9 11/20/2018 0828   CL 101 11/20/2018 0828   CO2 25 11/20/2018 0828   GLUCOSE 98 11/20/2018 0828   GLUCOSE 86 03/13/2018 1152   BUN 10 11/20/2018 0828   CREATININE 0.73 11/20/2018 0828   CREATININE 0.72 02/03/2014 1648   CALCIUM 8.9 11/20/2018 0828   GFRNONAA 96 11/20/2018 0828   GFRAA 111 11/20/2018 0828   Lab Results  Component Value Date   HGBA1C 6.1 (H) 11/20/2018   HGBA1C 5.9 (H) 07/14/2018   HGBA1C 5.9 03/13/2018   HGBA1C 5.8 (H)  09/12/2017   HGBA1C 6.2 (H)  05/23/2017   Lab Results  Component Value Date   INSULIN 21.2 11/20/2018   INSULIN 15.4 07/14/2018   INSULIN 12.9 09/12/2017   INSULIN 31.1 (H) 05/23/2017   CBC    Component Value Date/Time   WBC 5.9 03/13/2018 1152   RBC 4.35 03/13/2018 1152   HGB 11.9 (L) 03/13/2018 1152   HGB 12.1 05/23/2017 1025   HCT 35.9 (L) 03/13/2018 1152   HCT 36.6 05/23/2017 1025   PLT 283.0 03/13/2018 1152   MCV 82.4 03/13/2018 1152   MCV 82 05/23/2017 1025   MCH 27.1 05/23/2017 1025   MCH 28.2 02/03/2014 1648   MCHC 33.2 03/13/2018 1152   RDW 15.0 03/13/2018 1152   RDW 14.9 05/23/2017 1025   LYMPHSABS 1.7 05/23/2017 1025   MONOABS 0.4 06/09/2014 0843   EOSABS 0.1 05/23/2017 1025   BASOSABS 0.0 05/23/2017 1025   Iron/TIBC/Ferritin/ %Sat No results found for: IRON, TIBC, FERRITIN, IRONPCTSAT Lipid Panel     Component Value Date/Time   CHOL 146 11/20/2018 0828   TRIG 89 11/20/2018 0828   HDL 35 (L) 11/20/2018 0828   CHOLHDL 3 03/13/2018 1152   VLDL 13.2 03/13/2018 1152   LDLCALC 93 11/20/2018 0828   Hepatic Function Panel     Component Value Date/Time   PROT 6.9 11/20/2018 0828   ALBUMIN 4.2 11/20/2018 0828   AST 9 11/20/2018 0828   ALT 11 11/20/2018 0828   ALKPHOS 80 11/20/2018 0828   BILITOT 0.3 11/20/2018 0828   BILIDIR 0.0 06/09/2014 0843   IBILI 0.1 (L) 02/03/2014 1648      Component Value Date/Time   TSH 2.10 03/13/2018 1152   TSH 1.710 05/23/2017 1025   TSH 1.26 03/15/2017 1140     Ref. Range 11/20/2018 08:28  Vitamin D, 25-Hydroxy Latest Ref Range: 30.0 - 100.0 ng/mL 47.9    OBESITY BEHAVIORAL INTERVENTION VISIT  Today's visit was # 62  Starting weight: 325 lbs Starting date: 05/23/2017 Today's weight : 317 lbs Today's date: 05/20/2019 Total lbs lost to date: 8    05/20/2019  Height 5\' 9"  (1.753 m)  Weight 317 lb 9.6 oz (144.1 kg) (A)  BMI (Calculated) 46.88  BLOOD PRESSURE - SYSTOLIC 0000000  BLOOD PRESSURE - DIASTOLIC 81   Body Fat % 123XX123 %  Total Body  Water (lbs) 119 lbs    ASK: We discussed the diagnosis of obesity with [name] today and Rosamond agreed to give [name] permission to discuss obesity behavioral modification therapy today.  ASSESS: Gaby has the diagnosis of obesity and her BMI today is 46.88 Willye is in the action stage of change   ADVISE: Sidonie was educated on the multiple health risks of obesity as well as the benefit of weight loss to improve her health. She was advised of the need for long term treatment and the importance of lifestyle modifications to improve her current health and to decrease her risk of future health problems.  AGREE: Multiple dietary modification options and treatment options were discussed and  Analilia agreed to follow the recommendations documented in the above note.  ARRANGE: Nethra was educated on the importance of frequent visits to treat obesity as outlined per CMS and USPSTF guidelines and agreed to schedule her next follow up appointment today.  I, Doreene Nest, am acting as transcriptionist for [employer], FNP-C  I have reviewed the above documentation for accuracy and completeness, and I agree with the above.  - [employer],  FNP-C.

## 2019-05-27 ENCOUNTER — Encounter (INDEPENDENT_AMBULATORY_CARE_PROVIDER_SITE_OTHER): Payer: Self-pay | Admitting: Family Medicine

## 2019-06-04 ENCOUNTER — Ambulatory Visit (INDEPENDENT_AMBULATORY_CARE_PROVIDER_SITE_OTHER): Payer: BC Managed Care – PPO

## 2019-06-04 ENCOUNTER — Other Ambulatory Visit: Payer: Self-pay

## 2019-06-04 DIAGNOSIS — Z23 Encounter for immunization: Secondary | ICD-10-CM | POA: Diagnosis not present

## 2019-06-05 ENCOUNTER — Encounter (INDEPENDENT_AMBULATORY_CARE_PROVIDER_SITE_OTHER): Payer: Self-pay | Admitting: Family Medicine

## 2019-06-09 MED FILL — LOSARTAN POTASSIUM 100 MG T: 100 | 90 days supply | Qty: 90 | Fill #1

## 2019-06-09 MED FILL — AMLODIPINE BESYLATE 10 MG T: 10 | 90 days supply | Qty: 90 | Fill #1

## 2019-06-09 MED FILL — HYDROCHLOROTHIAZIDE 25 MG T: 25 | 90 days supply | Qty: 90 | Fill #1

## 2019-06-10 ENCOUNTER — Ambulatory Visit (INDEPENDENT_AMBULATORY_CARE_PROVIDER_SITE_OTHER): Payer: BC Managed Care – PPO | Admitting: Family Medicine

## 2019-06-15 ENCOUNTER — Encounter (INDEPENDENT_AMBULATORY_CARE_PROVIDER_SITE_OTHER): Payer: Self-pay | Admitting: Family Medicine

## 2019-06-15 ENCOUNTER — Ambulatory Visit (INDEPENDENT_AMBULATORY_CARE_PROVIDER_SITE_OTHER): Payer: BC Managed Care – PPO | Admitting: Family Medicine

## 2019-06-15 ENCOUNTER — Other Ambulatory Visit: Payer: Self-pay

## 2019-06-15 VITALS — BP 107/68 | HR 96 | Temp 98.5°F | Ht 69.0 in | Wt 316.0 lb

## 2019-06-15 DIAGNOSIS — E119 Type 2 diabetes mellitus without complications: Secondary | ICD-10-CM

## 2019-06-15 DIAGNOSIS — Z6841 Body Mass Index (BMI) 40.0 and over, adult: Secondary | ICD-10-CM

## 2019-06-15 DIAGNOSIS — E559 Vitamin D deficiency, unspecified: Secondary | ICD-10-CM

## 2019-06-15 DIAGNOSIS — Z9189 Other specified personal risk factors, not elsewhere classified: Secondary | ICD-10-CM

## 2019-06-15 DIAGNOSIS — F3289 Other specified depressive episodes: Secondary | ICD-10-CM | POA: Diagnosis not present

## 2019-06-15 MED ORDER — METFORMIN HCL 500 MG PO TABS: 500.0000 mg | ORAL_TABLET | Freq: Two times a day (BID) | ORAL | 0 refills | Status: DC

## 2019-06-17 ENCOUNTER — Encounter (INDEPENDENT_AMBULATORY_CARE_PROVIDER_SITE_OTHER): Payer: Self-pay | Admitting: Family Medicine

## 2019-06-17 ENCOUNTER — Other Ambulatory Visit: Payer: Self-pay | Admitting: Obstetrics & Gynecology

## 2019-06-17 DIAGNOSIS — Z1231 Encounter for screening mammogram for malignant neoplasm of breast: Secondary | ICD-10-CM

## 2019-06-17 NOTE — Progress Notes (Signed)
Office: [phone]  /  Fax: [phone]   HPI:   Chief Complaint: OBESITY Brandi Lester is here to discuss her progress with her obesity treatment plan. She is on the keep a food journal with 1500 calories and 95 grams of protein daily and she is following her eating plan approximately 85 % of the time. She states she is walking and riding the exercise bike 60 minutes 4 times per week. Brandi Lester journals "in her head" rather than using My Fitness Pal. She does not always get her protein in. She is skipping meals at times. She denies excessive simple carb's.  Her weight is (!) 316 lb (143.3 kg) today and has had a weight loss of 1 pound over a period of 3 weeks since her last visit. She has lost 9 lbs since starting treatment with [name].  Diabetes II Brandi Lester has a diagnosis of diabetes type II and she is well controlled. Her last A1c was at 6.1 on 11/20/18. Jakaila  denies any hypoglycemic episodes. She has been working on intensive lifestyle modifications including diet, exercise, and weight loss to help control her blood glucose levels.  Vitamin D deficiency Brandi Lester has a diagnosis of vitamin D deficiency. She is currently taking OTC vit D. Her last vitamin D level was at goal. She denies nausea, vomiting or muscle weakness.  At risk for osteopenia and osteoporosis Brandi Lester is at higher risk of osteopenia and osteoporosis due to vitamin D deficiency.   Depression with emotional eating behaviors Brandi Lester feels the Bupropion helps with impulse control. She struggles with emotional eating and using food for comfort to the extent that it is negatively impacting her health. She often snacks when she is not hungry. Brandi Lester sometimes feels she is out of control and then feels guilty that she made poor food choices. She has been working on behavior modification techniques to help reduce her emotional eating and has been somewhat successful. She shows no sign of suicidal or homicidal ideations.  ASSESSMENT  AND PLAN:  Type 2 diabetes mellitus without complication, without long-term current use of insulin ([place]) - Plan: metFORMIN (GLUCOPHAGE) 500 MG tablet  Other depression - with emotional eating   Vitamin D deficiency  At risk for osteoporosis  Class 3 severe obesity with serious comorbidity and body mass index (BMI) of 45.0 to 49.9 in adult, unspecified obesity type ([place])  PLAN:  Diabetes II Brandi Lester has been given extensive diabetes education by myself today including ideal fasting and post-prandial blood glucose readings, individual ideal Hgb A1c goals  and hypoglycemia prevention. We discussed the importance of good blood sugar control to decrease the likelihood of diabetic complications such as nephropathy, neuropathy, limb loss, blindness, coronary artery disease, and death. We discussed the importance of intensive lifestyle modification including diet, exercise and weight loss as the first line treatment for diabetes. Brandi Lester agrees to continue her diabetes medications and will follow up at the agreed upon time.  Vitamin D Deficiency Brandi Lester was informed that low vitamin D levels contributes to fatigue and are associated with obesity, breast, and colon cancer. Brandi Lester will continue to take OTC Vit D and she will follow up for routine testing of vitamin D, at least 2-3 times per year. She was informed of the risk of over-replacement of vitamin D and agrees to not increase her dose unless she discusses this with [name] first. We will check vitamin D level at the next visit and Brandi Lester will follow up at the agreed upon time.  At risk for osteopenia  and osteoporosis Brandi Lester was given extended  (15 minutes) osteoporosis prevention counseling today. Brandi Lester is at risk for osteopenia and osteoporosis due to her vitamin D deficiency. She was encouraged to take her vitamin D and follow her higher calcium diet and increase strengthening exercise to help strengthen her bones and decrease her risk of  osteopenia and osteoporosis.  Depression with Emotional Eating Behaviors We discussed behavior modification techniques today to help Brandi Lester deal with her emotional eating and depression. She will continue Bupropion SR 200 mg daily and follow up as directed.  Obesity Brandi Lester is currently in the action stage of change. As such, her goal is to continue with weight loss efforts She has agreed to keep a food journal with 1500 calories and 95 grams of protein daily Brandi Lester will continue her current exercise regimen for weight loss and overall health benefits. We discussed the following Behavioral Modification Strategies today: planning for success, no skipping meals, keep a strict food journal and increasing lean protein intake  Brandi Lester will use MyFitnessPal to journal consistently. We will check indirect calorimetry at the next visit.  Brandi Lester has agreed to follow up with our clinic in 3 weeks. She was informed of the importance of frequent follow up visits to maximize her success with intensive lifestyle modifications for her multiple health conditions.  ALLERGIES: Allergies  Allergen Reactions  . Penicillins Itching and Swelling    Has patient had a PCN reaction causing immediate rash, facial/tongue/throat swelling, SOB or lightheadedness with hypotension: yes Has patient had a PCN reaction causing severe rash involving mucus membranes or skin necrosis: no Has patient had a PCN reaction that required hospitalization no Has patient had a PCN reaction occurring within the last 10 years: yes If all of the above answers are "NO", then may proceed with Cephalosporin use.   Brandi Lester [Liraglutide]     Vomiting    MEDICATIONS: Current Outpatient Medications on File Prior to Visit  Medication Sig Dispense Refill  . acetaminophen (TYLENOL 8 HOUR) 650 MG CR tablet Take 1 tablet (650 mg total) every 8 (eight) hours as needed by mouth for pain.    Brandi Lester amLODipine (NORVASC) 10 MG tablet Take 1 tablet  (10 mg total) by mouth daily. 90 tablet 1  . azelastine (ASTELIN) 0.1 % nasal spray Place 2 sprays into both nostrils 2 (two) times daily. Use in each nostril as directed 90 mL 1  . buPROPion (WELLBUTRIN SR) 200 MG 12 hr tablet Take 1 tablet (200 mg total) by mouth daily. 90 tablet 0  . EPINEPHrine (EPIPEN 2-PAK) 0.3 mg/0.3 mL IJ SOAJ injection Use as directed for severe allergic reaction 2 each 1  . fexofenadine (ALLEGRA ALLERGY) 180 MG tablet Take 1 tablet (180 mg total) by mouth daily. 301 tablet 1  . fluticasone (FLONASE) 50 MCG/ACT nasal spray Place 2 sprays into both nostrils daily as needed for allergies. 48 g 1  . glucose blood test strip OneTouch Ultra Test strips    . hydrochlorothiazide (HYDRODIURIL) 25 MG tablet Take 1 tablet (25 mg total) by mouth daily. 90 tablet 1  . ibuprofen (ADVIL,MOTRIN) 600 MG tablet Take 1 tablet (600 mg total) by mouth every 6 (six) hours as needed. 90 tablet 0  . losartan (COZAAR) 100 MG tablet Take 1 tablet (100 mg total) by mouth at bedtime. 90 tablet 1  . medroxyPROGESTERone (PROVERA) 10 MG tablet   6  . montelukast (SINGULAIR) 10 MG tablet Take 1 tablet (10 mg total) by mouth at bedtime as  needed. 90 tablet 1  . Omega-3 Fatty Acids (FISH OIL PO) Take 1 capsule by mouth daily.    [name] DELICA LANCETS [id number] MISC OneTouch Delica Lancets 33 gauge    . pantoprazole (PROTONIX) 40 MG tablet Take 1 tablet (40 mg total) by mouth daily. 90 tablet 1  . potassium chloride SA (K-DUR) 20 MEQ tablet Take 1 tablet (20 mEq total) by mouth 3 (three) times daily. 270 tablet 1  . Probiotic Product (PROBIOTIC-10 PO) Take by mouth daily.    . TURMERIC PO Take 1 capsule by mouth daily.     No current facility-administered medications on file prior to visit.     PAST MEDICAL HISTORY: Past Medical History:  Diagnosis Date  . ADD (attention deficit disorder)   . Allergy    allergic rhinitis  . Anemia 07/25/2013  . Angio-edema   . Back pain   . Costochondritis  02/20/2015  . Diabetes mellitus    Type II   previously 3 years ago - no longer on meds   . Dyslipidemia 07/25/2013  . Fungus infection 10/13   Left great toe  . GERD (gastroesophageal reflux disease)   . Hypertension   . IBS (irritable bowel syndrome)   . Ingrown nail 10/13   right foot next to the last toe  . Insomnia 04/20/2013  . Joint pain   . Lactose intolerance   . Leg edema   . Obesity   . Pedal edema 02/20/2015  . Prediabetes   . Preventative health care 09/25/2015  . PVC (premature ventricular contraction)     PAST SURGICAL HISTORY: Past Surgical History:  Procedure Laterality Date  . HYSTEROSCOPY N/A 10/04/2015   Procedure: HYSTEROSCOPY with Removal IUD;  Surgeon: Vanessa Kick, MD;  Location: [facility];  Service: Gynecology;  Laterality: N/A;  . lasik     eye surgery  . WISDOM TOOTH EXTRACTION      SOCIAL HISTORY: Social History   Tobacco Use  . Smoking status: Former Smoker    Packs/day: 1.00    Years: 15.00    Pack years: 15.00    Quit date: 09/03/2001    Years since quitting: 17.7  . Smokeless tobacco: Never Used  Substance Use Topics  . Alcohol use: Yes    Comment: rare  . Drug use: No    FAMILY HISTORY: Family History  Problem Relation Age of Onset  . Heart attack Mother 52  . Hypertension Mother   . Heart disease Mother   . Obesity Mother   . Cancer Father        prostate  . Heart attack Sister 10  . Hypertension Other     ROS: Review of Systems  Constitutional: Positive for weight loss.  Gastrointestinal: Negative for nausea and vomiting.  Musculoskeletal:       Negative for muscle weakness  Endo/Heme/Allergies:       Negative for hypoglycemia  Psychiatric/Behavioral: Positive for depression. Negative for suicidal ideas.    PHYSICAL EXAM: Blood pressure 107/68, pulse 96, temperature 98.5 F (36.9 C), temperature source Oral, height 5\' 9"  (1.753 m), weight (!) 316 lb (143.3 kg), SpO2 99 %. Body mass index is 46.67 kg/m. Physical Exam  Vitals signs reviewed.  Constitutional:      Appearance: Normal appearance. She is well-developed. She is obese.  Cardiovascular:     Rate and Rhythm: Normal rate.  Pulmonary:     Effort: Pulmonary effort is normal.  Musculoskeletal: Normal range of motion.  Skin:  General: Skin is warm and dry.  Neurological:     Mental Status: She is alert and oriented to person, place, and time.  Psychiatric:        Mood and Affect: Mood normal.        Behavior: Behavior normal.        Thought Content: Thought content does not include homicidal or suicidal ideation.     RECENT LABS AND TESTS: BMET    Component Value Date/Time   NA 142 11/20/2018 0828   K 3.9 11/20/2018 0828   CL 101 11/20/2018 0828   CO2 25 11/20/2018 0828   GLUCOSE 98 11/20/2018 0828   GLUCOSE 86 03/13/2018 1152   BUN 10 11/20/2018 0828   CREATININE 0.73 11/20/2018 0828   CREATININE 0.72 02/03/2014 1648   CALCIUM 8.9 11/20/2018 0828   GFRNONAA 96 11/20/2018 0828   GFRAA 111 11/20/2018 0828   Lab Results  Component Value Date   HGBA1C 6.1 (H) 11/20/2018   HGBA1C 5.9 (H) 07/14/2018   HGBA1C 5.9 03/13/2018   HGBA1C 5.8 (H) 09/12/2017   HGBA1C 6.2 (H) 05/23/2017   Lab Results  Component Value Date   INSULIN 21.2 11/20/2018   INSULIN 15.4 07/14/2018   INSULIN 12.9 09/12/2017   INSULIN 31.1 (H) 05/23/2017   CBC    Component Value Date/Time   WBC 5.9 03/13/2018 1152   RBC 4.35 03/13/2018 1152   HGB 11.9 (L) 03/13/2018 1152   HGB 12.1 05/23/2017 1025   HCT 35.9 (L) 03/13/2018 1152   HCT 36.6 05/23/2017 1025   PLT 283.0 03/13/2018 1152   MCV 82.4 03/13/2018 1152   MCV 82 05/23/2017 1025   MCH 27.1 05/23/2017 1025   MCH 28.2 02/03/2014 1648   MCHC 33.2 03/13/2018 1152   RDW 15.0 03/13/2018 1152   RDW 14.9 05/23/2017 1025   LYMPHSABS 1.7 05/23/2017 1025   MONOABS 0.4 06/09/2014 0843   EOSABS 0.1 05/23/2017 1025   BASOSABS 0.0 05/23/2017 1025   Iron/TIBC/Ferritin/ %Sat No results found for: IRON,  TIBC, FERRITIN, IRONPCTSAT Lipid Panel     Component Value Date/Time   CHOL 146 11/20/2018 0828   TRIG 89 11/20/2018 0828   HDL 35 (L) 11/20/2018 0828   CHOLHDL 3 03/13/2018 1152   VLDL 13.2 03/13/2018 1152   LDLCALC 93 11/20/2018 0828   Hepatic Function Panel     Component Value Date/Time   PROT 6.9 11/20/2018 0828   ALBUMIN 4.2 11/20/2018 0828   AST 9 11/20/2018 0828   ALT 11 11/20/2018 0828   ALKPHOS 80 11/20/2018 0828   BILITOT 0.3 11/20/2018 0828   BILIDIR 0.0 06/09/2014 0843   IBILI 0.1 (L) 02/03/2014 1648      Component Value Date/Time   TSH 2.10 03/13/2018 1152   TSH 1.710 05/23/2017 1025   TSH 1.26 03/15/2017 1140     Ref. Range 11/20/2018 08:28  Vitamin D, 25-Hydroxy Latest Ref Range: 30.0 - 100.0 ng/mL 47.9    OBESITY BEHAVIORAL INTERVENTION VISIT  Today's visit was # 13  Starting weight: 325 lbs Starting date: 05/23/2017 Today's weight : 316 lbs Today's date: 06/15/2019 Total lbs lost to date: 9    06/15/2019  Height 5\' 9"  (1.753 m)  Weight 316 lb (143.3 kg) (A)  BMI (Calculated) 46.64  BLOOD PRESSURE - SYSTOLIC [id number]  BLOOD PRESSURE - DIASTOLIC 68   Body Fat % 0000000 %  Total Body Water (lbs) 116.4 lbs    ASK: We discussed the diagnosis of obesity with [name]  today and Tyrah agreed to give [name] permission to discuss obesity behavioral modification therapy today.  ASSESS: Brijette has the diagnosis of obesity and her BMI today is 46.64 Ashtin is in the action stage of change   ADVISE: Nashelle was educated on the multiple health risks of obesity as well as the benefit of weight loss to improve her health. She was advised of the need for long term treatment and the importance of lifestyle modifications to improve her current health and to decrease her risk of future health problems.  AGREE: Multiple dietary modification options and treatment options were discussed and  Maclaine agreed to follow the recommendations documented in the above  note.  ARRANGE: Arelene was educated on the importance of frequent visits to treat obesity as outlined per CMS and USPSTF guidelines and agreed to schedule her next follow up appointment today.  Corey Skains, am acting as Location manager for [employer], FNP-C.  I have reviewed the above documentation for accuracy and completeness, and I agree with the above.  - Marvella Jenning, FNP-C.

## 2019-06-18 ENCOUNTER — Encounter (INDEPENDENT_AMBULATORY_CARE_PROVIDER_SITE_OTHER): Payer: Self-pay | Admitting: Family Medicine

## 2019-06-18 ENCOUNTER — Other Ambulatory Visit (INDEPENDENT_AMBULATORY_CARE_PROVIDER_SITE_OTHER): Payer: Self-pay

## 2019-06-18 ENCOUNTER — Encounter: Payer: Self-pay | Admitting: Family Medicine

## 2019-06-18 DIAGNOSIS — E119 Type 2 diabetes mellitus without complications: Secondary | ICD-10-CM

## 2019-06-18 MED ORDER — METFORMIN HCL 500 MG PO TABS: 500.0000 mg | ORAL_TABLET | Freq: Two times a day (BID) | ORAL | 0 refills | Status: DC

## 2019-06-18 MED FILL — metFORMIN HCL 500 MG TABS: 500 | 90 days supply | Qty: 180 | Fill #0

## 2019-06-18 MED FILL — POTASSIUM CHLORIDE CRYS ER: 20 | 90 days supply | Qty: 270 | Fill #1

## 2019-07-07 ENCOUNTER — Encounter (INDEPENDENT_AMBULATORY_CARE_PROVIDER_SITE_OTHER): Payer: Self-pay | Admitting: Family Medicine

## 2019-07-07 ENCOUNTER — Other Ambulatory Visit: Payer: Self-pay

## 2019-07-07 ENCOUNTER — Encounter: Payer: Self-pay | Admitting: Family Medicine

## 2019-07-07 ENCOUNTER — Ambulatory Visit (INDEPENDENT_AMBULATORY_CARE_PROVIDER_SITE_OTHER): Payer: BC Managed Care – PPO | Admitting: Family Medicine

## 2019-07-07 VITALS — BP 121/73 | HR 98 | Temp 98.3°F | Ht 69.0 in | Wt 316.0 lb

## 2019-07-07 DIAGNOSIS — E786 Lipoprotein deficiency: Secondary | ICD-10-CM | POA: Diagnosis not present

## 2019-07-07 DIAGNOSIS — R0602 Shortness of breath: Secondary | ICD-10-CM | POA: Diagnosis not present

## 2019-07-07 DIAGNOSIS — E559 Vitamin D deficiency, unspecified: Secondary | ICD-10-CM

## 2019-07-07 DIAGNOSIS — D649 Anemia, unspecified: Secondary | ICD-10-CM

## 2019-07-07 DIAGNOSIS — E119 Type 2 diabetes mellitus without complications: Secondary | ICD-10-CM

## 2019-07-07 DIAGNOSIS — Z9189 Other specified personal risk factors, not elsewhere classified: Secondary | ICD-10-CM | POA: Diagnosis not present

## 2019-07-07 DIAGNOSIS — Z6841 Body Mass Index (BMI) 40.0 and over, adult: Secondary | ICD-10-CM

## 2019-07-08 ENCOUNTER — Encounter (INDEPENDENT_AMBULATORY_CARE_PROVIDER_SITE_OTHER): Payer: Self-pay | Admitting: Family Medicine

## 2019-07-08 DIAGNOSIS — E786 Lipoprotein deficiency: Secondary | ICD-10-CM | POA: Insufficient documentation

## 2019-07-08 LAB — COMPREHENSIVE METABOLIC PANEL
ALT: 9 IU/L (ref 0–32)
AST: 11 IU/L (ref 0–40)
Albumin/Globulin Ratio: 1.5 (ref 1.2–2.2)
Albumin: 4.3 g/dL (ref 3.8–4.9)
Alkaline Phosphatase: 84 IU/L (ref 39–117)
BUN/Creatinine Ratio: 17 (ref 9–23)
BUN: 10 mg/dL (ref 6–24)
Bilirubin Total: 0.2 mg/dL (ref 0.0–1.2)
CO2: 24 mmol/L (ref 20–29)
Calcium: 9.1 mg/dL (ref 8.7–10.2)
Chloride: 101 mmol/L (ref 96–106)
Creatinine, Ser: 0.6 mg/dL (ref 0.57–1.00)
GFR calc Af Amer: 122 mL/min/{1.73_m2} (ref >59)
GFR calc non Af Amer: 106 mL/min/{1.73_m2} (ref >59)
Globulin, Total: 2.9 g/dL (ref 1.5–4.5)
Glucose: 86 mg/dL (ref 65–99)
Potassium: 3.6 mmol/L (ref 3.5–5.2)
Sodium: 139 mmol/L (ref 134–144)
Total Protein: 7.2 g/dL (ref 6.0–8.5)

## 2019-07-08 LAB — CBC WITH DIFFERENTIAL/PLATELET
Basophils Absolute: 0.1 10*3/uL (ref 0.0–0.2)
Basos: 1 %
EOS (ABSOLUTE): 0.1 10*3/uL (ref 0.0–0.4)
Eos: 1 %
Hematocrit: 37.3 % (ref 34.0–46.6)
Hemoglobin: 12.3 g/dL (ref 11.1–15.9)
Immature Grans (Abs): 0 10*3/uL (ref 0.0–0.1)
Immature Granulocytes: 0 %
Lymphocytes Absolute: 1.8 10*3/uL (ref 0.7–3.1)
Lymphs: 23 %
MCH: 27.8 pg (ref 26.6–33.0)
MCHC: 33 g/dL (ref 31.5–35.7)
MCV: 84 fL (ref 79–97)
Monocytes Absolute: 0.5 10*3/uL (ref 0.1–0.9)
Monocytes: 7 %
Neutrophils Absolute: 5.2 10*3/uL (ref 1.4–7.0)
Neutrophils: 68 %
Platelets: 304 10*3/uL (ref 150–450)
RBC: 4.43 x10E6/uL (ref 3.77–5.28)
RDW: 14.2 % (ref 11.7–15.4)
WBC: 7.6 10*3/uL (ref 3.4–10.8)

## 2019-07-08 LAB — LIPID PANEL WITH LDL/HDL RATIO
Cholesterol, Total: 129 mg/dL (ref 100–199)
HDL: 33 mg/dL — ABNORMAL LOW (ref >39)
LDL Chol Calc (NIH): 76 mg/dL (ref 0–99)
LDL/HDL Ratio: 2.3 ratio (ref 0.0–3.2)
Triglycerides: 106 mg/dL (ref 0–149)
VLDL Cholesterol Cal: 20 mg/dL (ref 5–40)

## 2019-07-08 LAB — T3: T3, Total: 112 ng/dL (ref 71–180)

## 2019-07-08 LAB — TSH: TSH: 1.41 u[IU]/mL (ref 0.450–4.500)

## 2019-07-08 LAB — T4, FREE: Free T4: 1.29 ng/dL (ref 0.82–1.77)

## 2019-07-08 LAB — INSULIN, RANDOM: INSULIN: 12.9 u[IU]/mL (ref 2.6–24.9)

## 2019-07-08 LAB — VITAMIN D 25 HYDROXY (VIT D DEFICIENCY, FRACTURES): Vit D, 25-Hydroxy: 49.4 ng/mL (ref 30.0–100.0)

## 2019-07-08 LAB — HEMOGLOBIN A1C
Est. average glucose Bld gHb Est-mCnc: 123 mg/dL
Hgb A1c MFr Bld: 5.9 % — ABNORMAL HIGH (ref 4.8–5.6)

## 2019-07-08 LAB — VITAMIN B12: Vitamin B-12: 491 pg/mL (ref 232–1245)

## 2019-07-08 NOTE — Progress Notes (Addendum)
Office: [phone]  /  Fax: [phone]   HPI:   Chief Complaint: OBESITY Brandi Lester is here to discuss her progress with her obesity treatment plan. She is on the keep a food journal with 1500 calories and 95 grams of protein at dinner daily and the Category 3 plan for breakfast and lunch, and she is following her eating plan approximately 75 % of the time. She states she is walking and riding her bike 60 minutes, 4 times per week. Brandi Lester had indirect calorimetry done today and RMR is increased to 2698, from 1993 in September 2018. She reports journaling and she has discovered she is not eating 1500 calories a day. She struggles with getting calories in at school, because she is afraid to be without a mask due to Brandi Lester. Her weight is (!) 316 lb (143.3 kg) today and she has maintained weight since her last visit. She has lost 9 lbs since starting treatment with [name].  Anemia Brandi Lester has been anemic in the past. Her last Hgb was 11.9 (03/13/18). She does still have menstrual periods and she takes progesterone to decrease the amount of bleeding. Her periods have not been heavy recently. She is not on iron.  Diabetes II Brandi Lester has a diagnosis of diabetes type II and she is well controlled. She is on metformin only. She could not tolerate GLP-1s in the past. Her last A1c was at 5.1 in March 2020. She denies hypoglycemia. She has been working on intensive lifestyle modifications including diet, exercise, and weight loss to help control her blood glucose levels.  Shortness of Breath with Exertion Brandi Lester reports shortness of breath with exertion. She notes getting out of breath sooner with activity than she used to. This has not gotten worse recently. Indirect Calorimetry will be checked today.  Low HDL Brandi Lester has low HDL at her last check. Her triglycerides and LDL are within normal limits. She is attempting  to improve her HDL levels with intensive lifestyle modification including a low saturated  fat diet, exercise and weight loss.   Vitamin D deficiency Brandi Lester has a diagnosis of vitamin D deficiency. Brandi Lester is currently taking OTC vit D and her vitamin D level is nearly at goal (47.9 on 11/20/18). She denies nausea, vomiting or muscle weakness.  At risk for osteopenia and osteoporosis Brandi Lester is at higher risk of osteopenia and osteoporosis due to vitamin D deficiency.   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  Diabetes II Brandi Lester has been given extensive diabetes education by myself today including ideal fasting and post-prandial blood glucose readings, individual ideal Hgb A1c goals and hypoglycemia prevention. We discussed the importance of good blood sugar control to decrease the likelihood of diabetic complications such as nephropathy, neuropathy, limb loss, blindness, coronary artery  disease, and death. We discussed the importance of intensive lifestyle modification including diet, exercise and weight loss as the first line treatment for diabetes. We will check B12, fasting insulin, glucose and A1c today. Brandi Lester agrees to continue her diabetes medications and she will follow up at the agreed upon time.  Shortness of Breath with  Exertion Brandi Lester's indirect calorimeter results showed VO2 of 388 and a Brandi of 2698. She will continue to work on weight loss and gradually increase exercise. We will check thyroid panel today and Brandi Lester will follow up as directed.  Low HDL Brandi Lester will increase exercise and we will check fasting lipid panel today. Brandi Lester agrees to follow up with our clinic in 2 to 3 weeks.  Vitamin D Deficiency Brandi Lester was informed that low vitamin D levels contributes to fatigue and are associated with obesity, breast, and colon cancer. Brandi Lester agrees to continue to take OTC Vit D @4 ,000 IU daily and she will follow up for routine testing of vitamin D, at least 2-3 times per year. She was informed of the risk of over-replacement of vitamin D and agrees to not increase her dose unless she discusses this with [name] first. We will check vitamin D level and she will follow up as directed.  At risk for osteopenia and osteoporosis Brandi Lester was given extended  (15 minutes) osteoporosis prevention counseling today. Brandi Lester is at risk for osteopenia and osteoporosis due to her vitamin D deficiency. She was encouraged to take her vitamin D and follow her higher calcium diet and increase strengthening exercise to help strengthen her bones and decrease her risk of osteopenia and osteoporosis.  Obesity Brandi Lester is currently in the action stage of change. As such, her goal is to continue with weight loss efforts She has agreed to keep a food journal with 1800 to 1900 calories and 110 grams of protein daily and follow the Category 4 plan Brandi Lester will continue walking and riding her bike for 60 minutes, 4 times per week for weight loss and overall health benefits. We discussed the following Behavioral Modification Strategies today: increasing lean protein intake, decreasing simple carbohydrates and work on meal planning and easy cooking plans. Recipe for egg muffins provided.   Brandi Lester has agreed to follow up with our clinic in  2 to 3 weeks. She was informed of the importance of frequent follow up visits to maximize her success with intensive lifestyle modifications for her multiple health conditions.  ALLERGIES: Allergies  Allergen Reactions   Penicillins Itching and Swelling    Has patient had a PCN reaction causing immediate rash, facial/tongue/throat swelling, SOB or lightheadedness with hypotension: yes Has patient had a PCN reaction causing severe rash involving mucus membranes or skin necrosis: no Has patient had a PCN reaction that required hospitalization no Has patient had a PCN reaction occurring within the last 10 years: yes If all of the above answers are "NO", then may proceed with Cephalosporin use.    Victoza [Liraglutide]     Vomiting    MEDICATIONS: Current Outpatient Medications on File Prior to Visit  Medication Sig Dispense Refill   acetaminophen (TYLENOL 8 HOUR) 650 MG CR tablet Take 1 tablet (650 mg total) every 8 (eight) hours as needed by mouth for pain.     amLODipine (NORVASC) 10 MG tablet Take 1 tablet (10 mg total) by mouth daily. 90 tablet 1   azelastine (ASTELIN) 0.1 % nasal spray Place 2 sprays into both nostrils 2 (two) times daily. Use in each nostril as directed  90 mL 1   buPROPion (WELLBUTRIN SR) 200 MG 12 hr tablet Take 1 tablet (200 mg total) by mouth daily. 90 tablet 0   cholecalciferol (VITAMIN D3) 25 MCG (1000 UT) tablet Take 4,000 Units by mouth daily.     EPINEPHrine (EPIPEN 2-PAK) 0.3 mg/0.3 mL IJ SOAJ injection Use as directed for severe allergic reaction 2 each 1   fexofenadine (ALLEGRA ALLERGY) 180 MG tablet Take 1 tablet (180 mg total) by mouth daily. 301 tablet 1   fluticasone (FLONASE) 50 MCG/ACT nasal spray Place 2 sprays into both nostrils daily as needed for allergies. 48 g 1   glucose blood test strip OneTouch Ultra Test strips     hydrochlorothiazide (HYDRODIURIL) 25 MG tablet Take 1 tablet (25 mg total) by mouth daily. 90 tablet 1   ibuprofen  (ADVIL,MOTRIN) 600 MG tablet Take 1 tablet (600 mg total) by mouth every 6 (six) hours as needed. 90 tablet 0   losartan (COZAAR) 100 MG tablet Take 1 tablet (100 mg total) by mouth at bedtime. 90 tablet 1   medroxyPROGESTERone (PROVERA) 10 MG tablet   6   metFORMIN (GLUCOPHAGE) 500 MG tablet Take 1 tablet (500 mg total) by mouth 2 (two) times daily with a meal. 180 tablet 0   montelukast (SINGULAIR) 10 MG tablet Take 1 tablet (10 mg total) by mouth at bedtime as needed. 90 tablet 1   Omega-3 Fatty Acids (FISH OIL PO) Take 1 capsule by mouth daily.     ONETOUCH DELICA LANCETS [id number] MISC OneTouch Delica Lancets 33 gauge     pantoprazole (PROTONIX) 40 MG tablet Take 1 tablet (40 mg total) by mouth daily. 90 tablet 1   potassium chloride SA (K-DUR) 20 MEQ tablet Take 1 tablet (20 mEq total) by mouth 3 (three) times daily. 270 tablet 1   Probiotic Product (PROBIOTIC-10 PO) Take by mouth daily.     TURMERIC PO Take 1 capsule by mouth daily.     No current facility-administered medications on file prior to visit.     PAST MEDICAL HISTORY: Past Medical History:  Diagnosis Date   ADD (attention deficit disorder)    Allergy    allergic rhinitis   Anemia 07/25/2013   Angio-edema    Back pain    Costochondritis 02/20/2015   Diabetes mellitus    Type II   previously 3 years ago - no longer on meds    Dyslipidemia 07/25/2013   Fungus infection 10/13   Left great toe   GERD (gastroesophageal reflux disease)    Hypertension    IBS (irritable bowel syndrome)    Ingrown nail 10/13   right foot next to the last toe   Insomnia 04/20/2013   Joint pain    Lactose intolerance    Leg edema    Obesity    Pedal edema 02/20/2015   Prediabetes    Preventative health care 09/25/2015   PVC (premature ventricular contraction)     PAST SURGICAL HISTORY: Past Surgical History:  Procedure Laterality Date   HYSTEROSCOPY N/A 10/04/2015   Procedure: HYSTEROSCOPY with Removal  IUD;  Surgeon: Vanessa Kick, MD;  Location: [facility];  Service: Gynecology;  Laterality: N/A;   lasik     eye surgery   WISDOM TOOTH EXTRACTION      SOCIAL HISTORY: Social History   Tobacco Use   Smoking status: Former Smoker    Packs/day: 1.00    Years: 15.00    Pack years: 15.00    Quit date: 09/03/2001  Years since quitting: 17.8   Smokeless tobacco: Never Used  Substance Use Topics   Alcohol use: Yes    Comment: rare   Drug use: No    FAMILY HISTORY: Family History  Problem Relation Age of Onset   Heart attack Mother 55   Hypertension Mother    Heart disease Mother    Obesity Mother    Cancer Father        prostate   Heart attack Sister 68   Hypertension Other     ROS: Review of Systems  Constitutional: Negative for weight loss.  Respiratory: Positive for shortness of breath (with exertion).   Gastrointestinal: Negative for nausea and vomiting.  Musculoskeletal:       Negative for muscle weakness  Endo/Heme/Allergies:       Negative for hypoglycemia    PHYSICAL EXAM: Blood pressure 121/73, pulse 98, temperature 98.3 F (36.8 C), temperature source Oral, height 5\' 9"  (1.753 m), weight (!) 316 lb (143.3 kg), SpO2 100 %. Body mass index is 46.67 kg/m. Physical Exam Vitals signs reviewed.  Constitutional:      Appearance: Normal appearance. She is well-developed. She is obese.  Cardiovascular:     Rate and Rhythm: Normal rate.  Pulmonary:     Effort: Pulmonary effort is normal.  Musculoskeletal: Normal range of motion.  Skin:    General: Skin is warm and dry.  Neurological:     Mental Status: She is alert and oriented to person, place, and time.  Psychiatric:        Mood and Affect: Mood normal.        Behavior: Behavior normal.     RECENT LABS AND TESTS: BMET    Component Value Date/Time   NA 142 11/20/2018 0828   K 3.9 11/20/2018 0828   CL 101 11/20/2018 0828   CO2 25 11/20/2018 0828   GLUCOSE 98 11/20/2018 0828   GLUCOSE 86  03/13/2018 1152   BUN 10 11/20/2018 0828   CREATININE 0.73 11/20/2018 0828   CREATININE 0.72 02/03/2014 1648   CALCIUM 8.9 11/20/2018 0828   GFRNONAA 96 11/20/2018 0828   GFRAA 111 11/20/2018 0828   Lab Results  Component Value Date   HGBA1C 6.1 (H) 11/20/2018   HGBA1C 5.9 (H) 07/14/2018   HGBA1C 5.9 03/13/2018   HGBA1C 5.8 (H) 09/12/2017   HGBA1C 6.2 (H) 05/23/2017   Lab Results  Component Value Date   INSULIN 21.2 11/20/2018   INSULIN 15.4 07/14/2018   INSULIN 12.9 09/12/2017   INSULIN 31.1 (H) 05/23/2017   CBC    Component Value Date/Time   WBC 5.9 03/13/2018 1152   RBC 4.35 03/13/2018 1152   HGB 11.9 (L) 03/13/2018 1152   HGB 12.1 05/23/2017 1025   HCT 35.9 (L) 03/13/2018 1152   HCT 36.6 05/23/2017 1025   PLT 283.0 03/13/2018 1152   MCV 82.4 03/13/2018 1152   MCV 82 05/23/2017 1025   MCH 27.1 05/23/2017 1025   MCH 28.2 02/03/2014 1648   MCHC 33.2 03/13/2018 1152   RDW 15.0 03/13/2018 1152   RDW 14.9 05/23/2017 1025   LYMPHSABS 1.7 05/23/2017 1025   MONOABS 0.4 06/09/2014 0843   EOSABS 0.1 05/23/2017 1025   BASOSABS 0.0 05/23/2017 1025   Iron/TIBC/Ferritin/ %Sat No results found for: IRON, TIBC, FERRITIN, IRONPCTSAT Lipid Panel     Component Value Date/Time   CHOL 146 11/20/2018 0828   TRIG 89 11/20/2018 0828   HDL 35 (L) 11/20/2018 0828   CHOLHDL 3 03/13/2018 1152  VLDL 13.2 03/13/2018 1152   LDLCALC 93 11/20/2018 0828   Hepatic Function Panel     Component Value Date/Time   PROT 6.9 11/20/2018 0828   ALBUMIN 4.2 11/20/2018 0828   AST 9 11/20/2018 0828   ALT 11 11/20/2018 0828   ALKPHOS 80 11/20/2018 0828   BILITOT 0.3 11/20/2018 0828   BILIDIR 0.0 06/09/2014 0843   IBILI 0.1 (L) 02/03/2014 1648      Component Value Date/Time   TSH 2.10 03/13/2018 1152   TSH 1.710 05/23/2017 1025   TSH 1.26 03/15/2017 1140     Ref. Range 11/20/2018 08:28  Vitamin D, 25-Hydroxy Latest Ref Range: 30.0 - 100.0 ng/mL 47.9    OBESITY BEHAVIORAL  INTERVENTION VISIT  Today's visit was # 4  Starting weight: 325 lbs Starting date: 05/23/2017 Today's weight : 316 lbs Today's date: 07/07/2019 Total lbs lost to date: 9    07/07/2019  Height 5\' 9"  (1.753 m)  Weight 316 lb (143.3 kg) (A)  BMI (Calculated) 46.64  BLOOD PRESSURE - SYSTOLIC [id number]  BLOOD PRESSURE - DIASTOLIC 73   Body Fat % 0000000 %  Total Body Water (lbs) 113.6 lbs  RMR 2698    ASK: We discussed the diagnosis of obesity with Brandi Lester today and Brandi Lester agreed to give [name] permission to discuss obesity behavioral modification therapy today.  ASSESS: Brandi Lester has the diagnosis of obesity and her BMI today is 46.64 Brandi Lester is in the action stage of change   ADVISE: Annahi was educated on the multiple health risks of obesity as well as the benefit of weight loss to improve her health. She was advised of the need for long term treatment and the importance of lifestyle modifications to improve her current health and to decrease her risk of future health problems.  AGREE: Multiple dietary modification options and treatment options were discussed and  Brandi Lester agreed to follow the recommendations documented in the above note.  ARRANGE: Brandi Lester was educated on the importance of frequent visits to treat obesity as outlined per CMS and USPSTF guidelines and agreed to schedule her next follow up appointment today.  I, Doreene Nest, am acting as transcriptionist for [employer], FNP-C  I have reviewed the above documentation for accuracy and completeness, and I agree with the above.  - Chalsea Darko, FNP-C.

## 2019-07-14 ENCOUNTER — Encounter: Payer: Self-pay | Admitting: Family Medicine

## 2019-07-14 ENCOUNTER — Telehealth: Payer: BC Managed Care – PPO | Admitting: Physician Assistant

## 2019-07-14 DIAGNOSIS — J329 Chronic sinusitis, unspecified: Secondary | ICD-10-CM | POA: Diagnosis not present

## 2019-07-14 MED ORDER — DOXYCYCLINE HYCLATE 100 MG PO TABS: 100.0000 mg | ORAL_TABLET | Freq: Two times a day (BID) | ORAL | 0 refills | Status: DC

## 2019-07-14 MED FILL — MEDROXYPROGESTERONE 10 MG T: 10 | 90 days supply | Qty: 90 | Fill #4

## 2019-07-14 MED FILL — DOXYCYCLINE HYCLATE 100 MG: 100 | 10 days supply | Qty: 20 | Fill #0

## 2019-07-14 NOTE — Progress Notes (Signed)
We are sorry that you are not feeling well.  Here is how we plan to help! Stop using your Astelin nasal spray.  There is a chance this medication is the cause of your symptoms.  Use only the saline and (if needed) Nasonex for a few days days.  See if this improves the issue with your nostril as well as your congestion.    If your symptoms fail to improve after stopping the Astelin or if they get worse, then you can take the antibiotic Doxycycline (See below for sinusitis information).     Based on what you have shared with me it looks like you have sinusitis.  Sinusitis is inflammation and infection in the sinus cavities of the head.  Based on your presentation I believe you most likely have Acute Bacterial Sinusitis.  This is an infection caused by bacteria and is treated with antibiotics. I have prescribed Doxycycline 100mg  by mouth twice a day for 10 days. You may use an oral decongestant such as Mucinex D or if you have glaucoma or high blood pressure use plain Mucinex. Saline nasal spray help and can safely be used as often as needed for congestion.  If you develop worsening sinus pain, fever or notice severe headache and vision changes, or if symptoms are not better after completion of antibiotic, please schedule an appointment with a health care provider.    Sinus infections are not as easily transmitted as other respiratory infection, however we still recommend that you avoid close contact with loved ones, especially the very young and elderly.  Remember to wash your hands thoroughly throughout the day as this is the number one way to prevent the spread of infection!  Home Care:  Only take medications as instructed by your medical team.  Complete the entire course of an antibiotic.  Do not take these medications with alcohol.  A steam or ultrasonic humidifier can help congestion.  You can place a towel over your head and breathe in the steam from hot water coming from a faucet.  Avoid  close contacts especially the very young and the elderly.  Cover your mouth when you cough or sneeze.  Always remember to wash your hands.  Get Help Right Away If:  You develop worsening fever or sinus pain.  You develop a severe head ache or visual changes.  Your symptoms persist after you have completed your treatment plan.  Make sure you  Understand these instructions.  Will watch your condition.  Will get help right away if you are not doing well or get worse.  Your e-visit answers were reviewed by a board certified advanced clinical practitioner to complete your personal care plan.  Depending on the condition, your plan could have included both over the counter or prescription medications.  If there is a problem please reply  once you have received a response from your provider.  Your safety is important to [name].  If you have drug allergies check your prescription carefully.    You can use MyChart to ask questions about today's visit, request a non-urgent call back, or ask for a work or school excuse for 24 hours related to this e-Visit. If it has been greater than 24 hours you will need to follow up with your provider, or enter a new e-Visit to address those concerns.  You will get an e-mail in the next two days asking about your experience.  I hope that your e-visit has been valuable and will speed your  recovery. Thank you for using e-visits.   Greater than 5 minutes, yet less than 10 minutes of time have been spent researching, coordinating and implementing care for this patient today.

## 2019-07-27 ENCOUNTER — Other Ambulatory Visit: Payer: Self-pay

## 2019-07-27 ENCOUNTER — Encounter (INDEPENDENT_AMBULATORY_CARE_PROVIDER_SITE_OTHER): Payer: Self-pay | Admitting: Family Medicine

## 2019-07-27 ENCOUNTER — Ambulatory Visit (INDEPENDENT_AMBULATORY_CARE_PROVIDER_SITE_OTHER): Payer: BC Managed Care – PPO | Admitting: Family Medicine

## 2019-07-27 VITALS — BP 144/84 | HR 95 | Temp 99.0°F | Ht 69.0 in | Wt 316.0 lb

## 2019-07-27 DIAGNOSIS — E119 Type 2 diabetes mellitus without complications: Secondary | ICD-10-CM

## 2019-07-27 DIAGNOSIS — Z9189 Other specified personal risk factors, not elsewhere classified: Secondary | ICD-10-CM

## 2019-07-27 DIAGNOSIS — F3289 Other specified depressive episodes: Secondary | ICD-10-CM | POA: Diagnosis not present

## 2019-07-27 DIAGNOSIS — Z6841 Body Mass Index (BMI) 40.0 and over, adult: Secondary | ICD-10-CM

## 2019-07-27 MED ORDER — BUPROPION HCL ER (SR) 200 MG PO TB12: 200.0000 mg | ORAL_TABLET | Freq: Every day | ORAL | 0 refills | Status: DC

## 2019-07-27 MED FILL — BUPROPION HCL SR 200 MG TAB: 200 | 30 days supply | Qty: 30 | Fill #0

## 2019-07-27 NOTE — Progress Notes (Signed)
Office: [phone]  /  Fax: [phone]   HPI:   Chief Complaint: OBESITY Brandi Lester is here to discuss her progress with her obesity treatment plan. She is on the Category 4 plan and is following her eating plan approximately 80 % of the time. She states she is walking and riding a bike for 45 minutes 4 times per week. 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 She does go over her calories sometimes but she does tend to meet her protein goals on most days.  Her weight is (!) 316 lb (143.3 kg) today and has not lost weight since her last visit. She has lost 9 lbs since starting treatment with [name].  Diabetes II Brandi Lester has a diagnosis of diabetes type II and is well controlled on metformin only. We discussed adding a statin and she would like to think about this. Her last LDL was within normal limits and her HDL was low. Her last A1c was 5.9 on 07/07/19. She has been working on intensive lifestyle modifications including diet, exercise, and weight loss to help control her blood glucose levels.  At risk for cardiovascular disease Brandi Lester is at a higher than average risk for cardiovascular disease due to diabetes and obesity.   Depression with emotional eating behaviors Brandi Lester denies food cravings and her mood is stable on bupropion. Her blood pressure is slightly elevated today but is usually well controlled.   Depression screen [facility] 2/9 05/23/2017 07/03/2016 04/13/2016  Decreased Interest 2 0 0  Down, Depressed, Hopeless 1 0 0  PHQ - 2 Score 3 0 0  Altered sleeping 1 - -  Tired, decreased energy 1 - -  Change in appetite 1 - -  Feeling bad or failure about yourself  0 - -  Trouble concentrating 1 - -  Moving slowly or fidgety/restless 0 - -  Suicidal thoughts 0 - -  PHQ-9 Score 7 - -  Difficult doing work/chores Not difficult at all - -   ASSESSMENT AND PLAN:  Type 2 diabetes mellitus without complication, without long-term current use of insulin  (HCC)  Other depression - with emotional eating - Plan: buPROPion (WELLBUTRIN SR) 200 MG 12 hr tablet  At risk for heart disease  Class 3 severe obesity with serious comorbidity and body mass index (BMI) of 45.0 to 49.9 in adult, unspecified obesity type (HCC)  PLAN:  Diabetes II Brandi Lester has been given extensive diabetes education by myself today including ideal fasting and post-prandial blood glucose readings, individual ideal Hgb A1c goals, and hypoglycemia prevention. We discussed the importance of good blood sugar control to decrease the likelihood of diabetic complications such as nephropathy, neuropathy, limb loss, blindness, coronary artery disease, and death. We discussed the importance of intensive lifestyle modification including diet, exercise, and weight loss as the first line treatment for diabetes. [name] agrees to continue her metformin . She was advised that a statin is recommended for those with diabetes after age 57. Daphane will follow up at the agreed upon time in 3 weeks.  Cardiovascular risk counseling Brandi Lester was given extended (15 minutes) coronary artery disease prevention counseling today. She is 53 y.o. female and has risk factors for heart disease including diabetes and obesity. We discussed intensive lifestyle modifications today with an emphasis on specific weight loss instructions and strategies. Pt was also informed of the importance of increasing exercise and decreasing saturated fats to help prevent heart disease.  Depression with Emotional Eating Behaviors We discussed  behavior modification techniques today to help Brandi Lester deal with her emotional eating and depression. She has agreed to take Wellbutrin SR 200 mg qAM #30 with no refills and agreed to follow up as directed. We will continue to monitor BP.  Obesity Brandi Lester is currently in the action stage of change. As such, her goal is to continue with weight loss efforts.  She has agreed to follow the Category  4 plan. Brandi Lester was given the [employer]. Beula will continue current exercise regimen for weight loss and overall health benefits.. We discussed the following Behavioral Modification Strategies today: decreasing simple carbohydrates, planning for success, and holiday eating strategies.   Brandi Lester has agreed to follow up with our clinic in 3 weeks. She was informed of the importance of frequent follow up visits to maximize her success with intensive lifestyle modifications for her multiple health conditions.  ALLERGIES: Allergies  Allergen Reactions  . Penicillins Itching and Swelling    Has patient had a PCN reaction causing immediate rash, facial/tongue/throat swelling, SOB or lightheadedness with hypotension: yes Has patient had a PCN reaction causing severe rash involving mucus membranes or skin necrosis: no Has patient had a PCN reaction that required hospitalization no Has patient had a PCN reaction occurring within the last 10 years: yes If all of the above answers are "NO", then may proceed with Cephalosporin use.   Brandi Lester [Liraglutide]     Vomiting    MEDICATIONS: Current Outpatient Medications on File Prior to Visit  Medication Sig Dispense Refill  . acetaminophen (TYLENOL 8 HOUR) 650 MG CR tablet Take 1 tablet (650 mg total) every 8 (eight) hours as needed by mouth for pain.    [name] amLODipine (NORVASC) 10 MG tablet Take 1 tablet (10 mg total) by mouth daily. 90 tablet 1  . azelastine (ASTELIN) 0.1 % nasal spray Place 2 sprays into both nostrils 2 (two) times daily. Use in each nostril as directed 90 mL 1  . cholecalciferol (VITAMIN D3) 25 MCG (1000 UT) tablet Take 4,000 Units by mouth daily.    [name] doxycycline (VIBRA-TABS) 100 MG tablet Take 1 tablet (100 mg total) by mouth 2 (two) times daily. 20 tablet 0  . EPINEPHrine (EPIPEN 2-PAK) 0.3 mg/0.3 mL IJ SOAJ injection Use as directed for severe allergic reaction 2 each 1  . fexofenadine (ALLEGRA ALLERGY)  180 MG tablet Take 1 tablet (180 mg total) by mouth daily. 301 tablet 1  . fluticasone (FLONASE) 50 MCG/ACT nasal spray Place 2 sprays into both nostrils daily as needed for allergies. 48 g 1  . glucose blood test strip OneTouch Ultra Test strips    . hydrochlorothiazide (HYDRODIURIL) 25 MG tablet Take 1 tablet (25 mg total) by mouth daily. 90 tablet 1  . ibuprofen (ADVIL,MOTRIN) 600 MG tablet Take 1 tablet (600 mg total) by mouth every 6 (six) hours as needed. 90 tablet 0  . losartan (COZAAR) 100 MG tablet Take 1 tablet (100 mg total) by mouth at bedtime. 90 tablet 1  . medroxyPROGESTERone (PROVERA) 10 MG tablet   6  . metFORMIN (GLUCOPHAGE) 500 MG tablet Take 1 tablet (500 mg total) by mouth 2 (two) times daily with a meal. 180 tablet 0  . montelukast (SINGULAIR) 10 MG tablet Take 1 tablet (10 mg total) by mouth at bedtime as needed. 90 tablet 1  . Omega-3 Fatty Acids (FISH OIL PO) Take 1 capsule by mouth daily.    [name] DELICA LANCETS [id number] MISC OneTouch Delica Lancets 33  gauge    . pantoprazole (PROTONIX) 40 MG tablet Take 1 tablet (40 mg total) by mouth daily. 90 tablet 1  . potassium chloride SA (K-DUR) 20 MEQ tablet Take 1 tablet (20 mEq total) by mouth 3 (three) times daily. 270 tablet 1  . Probiotic Product (PROBIOTIC-10 PO) Take by mouth daily.    . TURMERIC PO Take 1 capsule by mouth daily.     No current facility-administered medications on file prior to visit.     PAST MEDICAL HISTORY: Past Medical History:  Diagnosis Date  . ADD (attention deficit disorder)   . Allergy    allergic rhinitis  . Anemia 07/25/2013  . Angio-edema   . Back pain   . Costochondritis 02/20/2015  . Diabetes mellitus    Type II   previously 3 years ago - no longer on meds   . Dyslipidemia 07/25/2013  . Fungus infection 10/13   Left great toe  . GERD (gastroesophageal reflux disease)   . Hypertension   . IBS (irritable bowel syndrome)   . Ingrown nail 10/13   right foot next to the last toe   . Insomnia 04/20/2013  . Joint pain   . Lactose intolerance   . Leg edema   . Obesity   . Pedal edema 02/20/2015  . Prediabetes   . Preventative health care 09/25/2015  . PVC (premature ventricular contraction)     PAST SURGICAL HISTORY: Past Surgical History:  Procedure Laterality Date  . HYSTEROSCOPY N/A 10/04/2015   Procedure: HYSTEROSCOPY with Removal IUD;  Surgeon: Vanessa Kick, MD;  Location: [facility];  Service: Gynecology;  Laterality: N/A;  . lasik     eye surgery  . WISDOM TOOTH EXTRACTION      SOCIAL HISTORY: Social History   Tobacco Use  . Smoking status: Former Smoker    Packs/day: 1.00    Years: 15.00    Pack years: 15.00    Quit date: 09/03/2001    Years since quitting: 17.9  . Smokeless tobacco: Never Used  Substance Use Topics  . Alcohol use: Yes    Comment: rare  . Drug use: No    FAMILY HISTORY: Family History  Problem Relation Age of Onset  . Heart attack Mother 65  . Hypertension Mother   . Heart disease Mother   . Obesity Mother   . Cancer Father        prostate  . Heart attack Sister 37  . Hypertension Other     ROS: Review of Systems  Constitutional: Negative for weight loss.  Psychiatric/Behavioral: Positive for depression.    PHYSICAL EXAM: Blood pressure (!) 144/84, pulse 95, temperature 99 F (37.2 C), temperature source Oral, height 5\' 9"  (1.753 m), weight (!) 316 lb (143.3 kg), SpO2 98 %. Body mass index is 46.67 kg/m. Physical Exam Vitals signs reviewed.  Constitutional:      Appearance: Normal appearance. She is obese.  Cardiovascular:     Rate and Rhythm: Normal rate.  Pulmonary:     Effort: Pulmonary effort is normal.  Musculoskeletal: Normal range of motion.  Skin:    General: Skin is warm and dry.  Neurological:     Mental Status: She is alert and oriented to person, place, and time.  Psychiatric:        Mood and Affect: Mood normal.        Behavior: Behavior normal.     RECENT LABS AND TESTS: BMET     Component Value Date/Time   NA 139  07/07/2019 0900   K 3.6 07/07/2019 0900   CL 101 07/07/2019 0900   CO2 24 07/07/2019 0900   GLUCOSE 86 07/07/2019 0900   GLUCOSE 86 03/13/2018 1152   BUN 10 07/07/2019 0900   CREATININE 0.60 07/07/2019 0900   CREATININE 0.72 02/03/2014 1648   CALCIUM 9.1 07/07/2019 0900   GFRNONAA 106 07/07/2019 0900   GFRAA 122 07/07/2019 0900   Lab Results  Component Value Date   HGBA1C 5.9 (H) 07/07/2019   HGBA1C 6.1 (H) 11/20/2018   HGBA1C 5.9 (H) 07/14/2018   HGBA1C 5.9 03/13/2018   HGBA1C 5.8 (H) 09/12/2017   Lab Results  Component Value Date   INSULIN 12.9 07/07/2019   INSULIN 21.2 11/20/2018   INSULIN 15.4 07/14/2018   INSULIN 12.9 09/12/2017   INSULIN 31.1 (H) 05/23/2017   CBC    Component Value Date/Time   WBC 7.6 07/07/2019 0900   WBC 5.9 03/13/2018 1152   RBC 4.43 07/07/2019 0900   RBC 4.35 03/13/2018 1152   HGB 12.3 07/07/2019 0900   HCT 37.3 07/07/2019 0900   PLT 304 07/07/2019 0900   MCV 84 07/07/2019 0900   MCH 27.8 07/07/2019 0900   MCH 28.2 02/03/2014 1648   MCHC 33.0 07/07/2019 0900   MCHC 33.2 03/13/2018 1152   RDW 14.2 07/07/2019 0900   LYMPHSABS 1.8 07/07/2019 0900   MONOABS 0.4 06/09/2014 0843   EOSABS 0.1 07/07/2019 0900   BASOSABS 0.1 07/07/2019 0900   Iron/TIBC/Ferritin/ %Sat No results found for: IRON, TIBC, FERRITIN, IRONPCTSAT Lipid Panel     Component Value Date/Time   CHOL 129 07/07/2019 0900   TRIG 106 07/07/2019 0900   HDL 33 (L) 07/07/2019 0900   CHOLHDL 3 03/13/2018 1152   VLDL 13.2 03/13/2018 1152   LDLCALC 76 07/07/2019 0900   Hepatic Function Panel     Component Value Date/Time   PROT 7.2 07/07/2019 0900   ALBUMIN 4.3 07/07/2019 0900   AST 11 07/07/2019 0900   ALT 9 07/07/2019 0900   ALKPHOS 84 07/07/2019 0900   BILITOT 0.2 07/07/2019 0900   BILIDIR 0.0 06/09/2014 0843   IBILI 0.1 (L) 02/03/2014 1648      Component Value Date/Time   TSH 1.410 07/07/2019 0900   TSH 2.10 03/13/2018  1152   TSH 1.710 05/23/2017 1025   Results for Tennell, Lovie T "[name] Celmer" (MRN [id number]) as of 07/29/2019 07:15  Ref. Range 07/07/2019 09:00  Vitamin D, 25-Hydroxy Latest Ref Range: 30.0 - 100.0 ng/mL 49.4    OBESITY BEHAVIORAL INTERVENTION VISIT  Today's visit was # 14  Starting weight: 325 lbs Starting date: 05/23/2017 Today's weight : Weight: (!) 316 lb (143.3 kg)  Today's date: 07/27/2019 Total lbs lost to date: 9    07/27/2019  Height 5\' 9"  (1.753 m)  Weight 316 lb (143.3 kg) (A)  BMI (Calculated) 46.64  BLOOD PRESSURE - SYSTOLIC [id number]  BLOOD PRESSURE - DIASTOLIC 84   Body Fat % 54 %    ASK: We discussed the diagnosis of obesity with [name] today and Ameli agreed to give [name] permission to discuss obesity behavioral modification therapy today.  ASSESS: Dayamy has the diagnosis of obesity and her BMI today is 46.64. Shaaron is in the action stage of change.   ADVISE: Gimena was educated on the multiple health risks of obesity as well as the benefit of weight loss to improve her health. She was advised of the need for long term treatment and the importance of lifestyle modifications  to improve her current health and to decrease her risk of future health problems.  AGREE: Multiple dietary modification options and treatment options were discussed and Voula agreed to follow the recommendations documented in the above note.  ARRANGE: Shellie was educated on the importance of frequent visits to treat obesity as outlined per CMS and USPSTF guidelines and agreed to schedule her next follow up appointment today.  Lenward Chancellor, CMA, am acting as Location manager for [employer], FNP-C  I have reviewed the above documentation for accuracy and completeness, and I agree with the above.  - Neftali Abair, FNP-C.

## 2019-07-28 ENCOUNTER — Encounter: Payer: Self-pay | Admitting: Family Medicine

## 2019-07-29 MED ORDER — ATORVASTATIN CALCIUM 10 MG PO TABS: 5.0000 mg | ORAL_TABLET | Freq: Every day | ORAL | 2 refills | Status: DC

## 2019-07-29 MED FILL — ATORVASTATIN 10 MG TABLET: 10 | 30 days supply | Qty: 15 | Fill #0

## 2019-08-03 ENCOUNTER — Encounter (INDEPENDENT_AMBULATORY_CARE_PROVIDER_SITE_OTHER): Payer: Self-pay | Admitting: Family Medicine

## 2019-08-05 ENCOUNTER — Ambulatory Visit
Admission: RE | Admit: 2019-08-05 | Discharge: 2019-08-05 | Disposition: A | Discharge status: Home / Self Care | Payer: BC Managed Care – PPO | Source: Ambulatory Visit | Attending: Obstetrics & Gynecology | Admitting: Obstetrics & Gynecology

## 2019-08-05 ENCOUNTER — Other Ambulatory Visit: Payer: Self-pay

## 2019-08-05 DIAGNOSIS — Z1231 Encounter for screening mammogram for malignant neoplasm of breast: Secondary | ICD-10-CM

## 2019-08-17 ENCOUNTER — Other Ambulatory Visit: Payer: Self-pay

## 2019-08-17 ENCOUNTER — Ambulatory Visit (INDEPENDENT_AMBULATORY_CARE_PROVIDER_SITE_OTHER): Payer: BC Managed Care – PPO | Admitting: Family Medicine

## 2019-08-17 ENCOUNTER — Encounter (INDEPENDENT_AMBULATORY_CARE_PROVIDER_SITE_OTHER): Payer: Self-pay | Admitting: Family Medicine

## 2019-08-17 VITALS — BP 146/87 | HR 105 | Temp 99.0°F | Ht 69.0 in | Wt 320.0 lb

## 2019-08-17 DIAGNOSIS — I10 Essential (primary) hypertension: Secondary | ICD-10-CM

## 2019-08-17 DIAGNOSIS — E66813 Obesity, class 3: Secondary | ICD-10-CM

## 2019-08-17 DIAGNOSIS — E119 Type 2 diabetes mellitus without complications: Secondary | ICD-10-CM

## 2019-08-17 DIAGNOSIS — F3289 Other specified depressive episodes: Secondary | ICD-10-CM

## 2019-08-17 DIAGNOSIS — Z9189 Other specified personal risk factors, not elsewhere classified: Secondary | ICD-10-CM

## 2019-08-17 DIAGNOSIS — Z6841 Body Mass Index (BMI) 40.0 and over, adult: Secondary | ICD-10-CM

## 2019-08-17 MED ORDER — METFORMIN HCL 500 MG PO TABS: 500.0000 mg | ORAL_TABLET | Freq: Two times a day (BID) | ORAL | 0 refills | Status: DC

## 2019-08-17 MED ORDER — BUPROPION HCL ER (SR) 150 MG PO TB12: 150.0000 mg | ORAL_TABLET | Freq: Every day | ORAL | 0 refills | Status: DC

## 2019-08-17 MED FILL — BUPROPION HCL ER (SR) 150 M: 150 | 90 days supply | Qty: 90 | Fill #0

## 2019-08-19 NOTE — Progress Notes (Signed)
Office: [phone]  /  Fax: [phone]   HPI:  Chief Complaint: OBESITY Brandi Lester is here to discuss her progress with her obesity treatment plan. She is on the Category 4 plan and states she is following her eating plan approximately 85 % of the time. She states she is walking and riding the stationary bike 35 to 40 minutes 4 to 5 times per week.  [name] has been getting lunch recently. However after the first of the year she will not be able to eat lunch because she is frightened to take her mask off at school due to [place].  Hypertension [name] is a 53 y.o. female with hypertension. Her blood pressure is elevated today. Her blood pressure runs 130's/80's at home. [name] denies chest pain or shortness of breath.  BP Readings from Last 3 Encounters:  08/17/19 (!) 146/87  07/27/19 (!) 144/84  07/07/19 121/73    Diabetes II Icesis has a diagnosis of diabetes type II. DM is well controlled. Rosy does not check her blood sugars. Shruti denies hypoglycemia or polyphagia.    Lab Results  Component Value Date   HGBA1C 5.9 (H) 07/07/2019   At risk for cardiovascular disease Brandi Lester is at a higher than average risk for cardiovascular disease due to obesity, hypertension and diabetes. She currently denies any chest pain.  Other Depression with emotional eating behaviors [name] is struggling with emotional eating. She feels that stress eating is fairly well controlled with occasional episodes. Her blood pressure is elevated today and has been over several visits.   Today's visit was # 74  Starting weight: 325 lbs Starting date: 05/23/2017 Today's weight : 320 lbs Today's date: 08/17/2019 Total lbs lost to date: 5 Total lbs lost since last in-office visit: 0  ASSESSMENT AND PLAN:  Essential hypertension  Type 2 diabetes mellitus without complication, without long-term current use of insulin ([place]) - Plan: metFORMIN (GLUCOPHAGE) 500 MG tablet  Other depression,  with emotional eating - Plan: buPROPion (WELLBUTRIN SR) 150 MG 12 hr tablet  At risk for heart disease  Class 3 severe obesity with serious comorbidity and body mass index (BMI) of 45.0 to 49.9 in adult, unspecified obesity type ([place])  PLAN:  Hypertension [name] is working on healthy weight loss and exercise to improve blood pressure control. We will watch for signs of hypotension as she continues her lifestyle modifications. [name] will continue all medications. [name] will decrease the dose of Bupropion from 150 mg BID to 150 mg QD.[name] She agrees to follow up as directed.  Diabetes II Brandi Lester has been given diabetes education by myself today. Good blood sugar control is important to decrease the likelihood of diabetic complications such as nephropathy, neuropathy, limb loss, blindness, coronary artery disease, and death. Intensive lifestyle modification including diet, exercise and weight loss were discussed as the first line treatment for diabetes. [name] agrees to continue metformin 500 mg BID #180 with no refills and follow up as directed.  Cardiovascular risk counseling [name] was given (~15 minutes) coronary artery disease prevention counseling today. She is 53 y.o. female and has risk factors for heart disease including obesity, hypertension and diabetes. We discussed intensive lifestyle modifications today with an emphasis on specific weight loss instructions and strategies.   Emotional Eating Behaviors (other depression) Behavior modification techniques were discussed today to help [name] deal with her emotional/non-hunger eating behaviors. [name] agrees to decrease the dose of Bupropion 150 mg qAM #90 with no refills, in light of her elevated blood pressure. We  will continue to follow and monitor her progress. [name] agrees to follow up with our clinic in 3 weeks.  Obesity [name] is currently in the action stage of change. As such, her goal is to continue with weight loss  efforts She has agreed to follow the Category 4 plan [name] will continue walking and riding the stationary bike 35 to 40 minutes, 4 to 5 times per week for weight loss and overall health benefits. We discussed the following Behavioral Modification Strategies today: planning for success, no skipping meals and increasing lean protein intake I advised her to try to get in protein on breaks without kids at school.  [name] has agreed to follow up with our clinic in 3 weeks. She was informed of the importance of frequent follow up visits to maximize her success with intensive lifestyle modifications for her multiple health conditions.  ALLERGIES: Allergies  Allergen Reactions  . Penicillins Itching and Swelling    Has patient had a PCN reaction causing immediate rash, facial/tongue/throat swelling, SOB or lightheadedness with hypotension: yes Has patient had a PCN reaction causing severe rash involving mucus membranes or skin necrosis: no Has patient had a PCN reaction that required hospitalization no Has patient had a PCN reaction occurring within the last 10 years: yes If all of the above answers are "NO", then may proceed with Cephalosporin use.   [name] [Liraglutide]     Vomiting    MEDICATIONS: Current Outpatient Medications on File Prior to Visit  Medication Sig Dispense Refill  . acetaminophen (TYLENOL 8 HOUR) 650 MG CR tablet Take 1 tablet (650 mg total) every 8 (eight) hours as needed by mouth for pain.    [name] amLODipine (NORVASC) 10 MG tablet Take 1 tablet (10 mg total) by mouth daily. 90 tablet 1  . atorvastatin (LIPITOR) 10 MG tablet Take 0.5 tablets (5 mg total) by mouth daily. 15 tablet 2  . azelastine (ASTELIN) 0.1 % nasal spray Place 2 sprays into both nostrils 2 (two) times daily. Use in each nostril as directed 90 mL 1  . cholecalciferol (VITAMIN D3) 25 MCG (1000 UT) tablet Take 4,000 Units by mouth daily.    [name] doxycycline (VIBRA-TABS) 100 MG tablet Take 1 tablet (100 mg  total) by mouth 2 (two) times daily. 20 tablet 0  . EPINEPHrine (EPIPEN 2-PAK) 0.3 mg/0.3 mL IJ SOAJ injection Use as directed for severe allergic reaction 2 each 1  . fexofenadine (ALLEGRA ALLERGY) 180 MG tablet Take 1 tablet (180 mg total) by mouth daily. 301 tablet 1  . fluticasone (FLONASE) 50 MCG/ACT nasal spray Place 2 sprays into both nostrils daily as needed for allergies. 48 g 1  . glucose blood test strip OneTouch Ultra Test strips    . hydrochlorothiazide (HYDRODIURIL) 25 MG tablet Take 1 tablet (25 mg total) by mouth daily. 90 tablet 1  . ibuprofen (ADVIL,MOTRIN) 600 MG tablet Take 1 tablet (600 mg total) by mouth every 6 (six) hours as needed. 90 tablet 0  . losartan (COZAAR) 100 MG tablet Take 1 tablet (100 mg total) by mouth at bedtime. 90 tablet 1  . medroxyPROGESTERone (PROVERA) 10 MG tablet   6  . montelukast (SINGULAIR) 10 MG tablet Take 1 tablet (10 mg total) by mouth at bedtime as needed. 90 tablet 1  . Omega-3 Fatty Acids (FISH OIL PO) Take 1 capsule by mouth daily.    [name] DELICA LANCETS [id number] MISC OneTouch Delica Lancets 33 gauge    . pantoprazole (PROTONIX) 40 MG  tablet Take 1 tablet (40 mg total) by mouth daily. 90 tablet 1  . potassium chloride SA (K-DUR) 20 MEQ tablet Take 1 tablet (20 mEq total) by mouth 3 (three) times daily. 270 tablet 1  . Probiotic Product (PROBIOTIC-10 PO) Take by mouth daily.    . TURMERIC PO Take 1 capsule by mouth daily.     No current facility-administered medications on file prior to visit.    PAST MEDICAL HISTORY: Past Medical History:  Diagnosis Date  . ADD (attention deficit disorder)   . Allergy    allergic rhinitis  . Anemia 07/25/2013  . Angio-edema   . Back pain   . Costochondritis 02/20/2015  . Diabetes mellitus    Type II   previously 3 years ago - no longer on meds   . Dyslipidemia 07/25/2013  . Fungus infection 10/13   Left great toe  . GERD (gastroesophageal reflux disease)   . Hypertension   . IBS (irritable  bowel syndrome)   . Ingrown nail 10/13   right foot next to the last toe  . Insomnia 04/20/2013  . Joint pain   . Lactose intolerance   . Leg edema   . Obesity   . Pedal edema 02/20/2015  . Prediabetes   . Preventative health care 09/25/2015  . PVC (premature ventricular contraction)     PAST SURGICAL HISTORY: Past Surgical History:  Procedure Laterality Date  . HYSTEROSCOPY N/A 10/04/2015   Procedure: HYSTEROSCOPY with Removal IUD;  Surgeon: Vanessa Kick, MD;  Location: [facility];  Service: Gynecology;  Laterality: N/A;  . lasik     eye surgery  . WISDOM TOOTH EXTRACTION      SOCIAL HISTORY: Social History   Tobacco Use  . Smoking status: Former Smoker    Packs/day: 1.00    Years: 15.00    Pack years: 15.00    Quit date: 09/03/2001    Years since quitting: 17.9  . Smokeless tobacco: Never Used  Substance Use Topics  . Alcohol use: Yes    Comment: rare  . Drug use: No    FAMILY HISTORY: Family History  Problem Relation Age of Onset  . Heart attack Mother 88  . Hypertension Mother   . Heart disease Mother   . Obesity Mother   . Cancer Father        prostate  . Heart attack Sister 60  . Hypertension Other     ROS: Review of Systems  Constitutional: Negative for weight loss.  Respiratory: Negative for shortness of breath.   Cardiovascular: Negative for chest pain.  Endo/Heme/Allergies:       Negative for hypoglycemia Negative for polyphagia  Psychiatric/Behavioral: Positive for depression.    PHYSICAL EXAM: Blood pressure (!) 146/87, pulse (!) 105, temperature 99 F (37.2 C), temperature source Oral, height 5\' 9"  (1.753 m), weight (!) 320 lb (145.2 kg), SpO2 99 %. Body mass index is 47.26 kg/m. Physical Exam Vitals reviewed.  Constitutional:      General: She is not in acute distress.    Appearance: Normal appearance. She is well-developed. She is obese.  Cardiovascular:     Rate and Rhythm: Normal rate.  Pulmonary:     Effort: Pulmonary effort is  normal.  Musculoskeletal:        General: Normal range of motion.  Skin:    General: Skin is warm and dry.  Neurological:     Mental Status: She is alert and oriented to person, place, and time.  Psychiatric:  Mood and Affect: Mood normal.        Behavior: Behavior normal.     RECENT LABS AND TESTS: BMET    Component Value Date/Time   NA 139 07/07/2019 0900   K 3.6 07/07/2019 0900   CL 101 07/07/2019 0900   CO2 24 07/07/2019 0900   GLUCOSE 86 07/07/2019 0900   GLUCOSE 86 03/13/2018 1152   BUN 10 07/07/2019 0900   CREATININE 0.60 07/07/2019 0900   CREATININE 0.72 02/03/2014 1648   CALCIUM 9.1 07/07/2019 0900   GFRNONAA 106 07/07/2019 0900   GFRAA 122 07/07/2019 0900   Lab Results  Component Value Date   HGBA1C 5.9 (H) 07/07/2019   HGBA1C 6.1 (H) 11/20/2018   HGBA1C 5.9 (H) 07/14/2018   HGBA1C 5.9 03/13/2018   HGBA1C 5.8 (H) 09/12/2017   Lab Results  Component Value Date   INSULIN 12.9 07/07/2019   INSULIN 21.2 11/20/2018   INSULIN 15.4 07/14/2018   INSULIN 12.9 09/12/2017   INSULIN 31.1 (H) 05/23/2017   CBC    Component Value Date/Time   WBC 7.6 07/07/2019 0900   WBC 5.9 03/13/2018 1152   RBC 4.43 07/07/2019 0900   RBC 4.35 03/13/2018 1152   HGB 12.3 07/07/2019 0900   HCT 37.3 07/07/2019 0900   PLT 304 07/07/2019 0900   MCV 84 07/07/2019 0900   MCH 27.8 07/07/2019 0900   MCH 28.2 02/03/2014 1648   MCHC 33.0 07/07/2019 0900   MCHC 33.2 03/13/2018 1152   RDW 14.2 07/07/2019 0900   LYMPHSABS 1.8 07/07/2019 0900   MONOABS 0.4 06/09/2014 0843   EOSABS 0.1 07/07/2019 0900   BASOSABS 0.1 07/07/2019 0900   Iron/TIBC/Ferritin/ %Sat No results found for: IRON, TIBC, FERRITIN, IRONPCTSAT Lipid Panel     Component Value Date/Time   CHOL 129 07/07/2019 0900   TRIG 106 07/07/2019 0900   HDL 33 (L) 07/07/2019 0900   CHOLHDL 3 03/13/2018 1152   VLDL 13.2 03/13/2018 1152   LDLCALC 76 07/07/2019 0900   Hepatic Function Panel     Component Value  Date/Time   PROT 7.2 07/07/2019 0900   ALBUMIN 4.3 07/07/2019 0900   AST 11 07/07/2019 0900   ALT 9 07/07/2019 0900   ALKPHOS 84 07/07/2019 0900   BILITOT 0.2 07/07/2019 0900   BILIDIR 0.0 06/09/2014 0843   IBILI 0.1 (L) 02/03/2014 1648      Component Value Date/Time   TSH 1.410 07/07/2019 0900   TSH 2.10 03/13/2018 1152   TSH 1.710 05/23/2017 1025     Ref. Range 07/07/2019 09:00  Vitamin D, 25-Hydroxy Latest Ref Range: 30.0 - 100.0 ng/mL 49.4    I, Doreene Nest, am acting as Location manager for [employer], FNP-C  I have reviewed the above documentation for accuracy and completeness, and I agree with the above.  - Jazmine Longshore, FNP-C.

## 2019-08-20 ENCOUNTER — Encounter (INDEPENDENT_AMBULATORY_CARE_PROVIDER_SITE_OTHER): Payer: Self-pay | Admitting: Family Medicine

## 2019-08-20 MED FILL — PANTOPRAZOLE SOD DR 40 MG T: 40 | 90 days supply | Qty: 90 | Fill #1

## 2019-09-09 ENCOUNTER — Ambulatory Visit (INDEPENDENT_AMBULATORY_CARE_PROVIDER_SITE_OTHER): Payer: BC Managed Care – PPO | Admitting: Family Medicine

## 2019-09-09 ENCOUNTER — Other Ambulatory Visit: Payer: Self-pay | Admitting: Family Medicine

## 2019-09-09 DIAGNOSIS — I10 Essential (primary) hypertension: Secondary | ICD-10-CM

## 2019-09-09 DIAGNOSIS — T7840XA Allergy, unspecified, initial encounter: Secondary | ICD-10-CM

## 2019-09-09 DIAGNOSIS — E876 Hypokalemia: Secondary | ICD-10-CM

## 2019-09-09 MED FILL — metFORMIN HCL 500 MG TABS: 500 | 90 days supply | Qty: 180 | Fill #0

## 2019-09-09 MED FILL — ATORVASTATIN 10 MG TABLET: 10 | 30 days supply | Qty: 15 | Fill #1

## 2019-09-10 MED FILL — AMLODIPINE BESYLATE 10 MG T: 10 | 90 days supply | Qty: 90 | Fill #0

## 2019-09-10 MED FILL — POTASSIUM CHLORIDE CRYS ER: 20 | 90 days supply | Qty: 270 | Fill #0

## 2019-09-10 MED FILL — LOSARTAN POTASSIUM 100 MG T: 100 | 90 days supply | Qty: 90 | Fill #0

## 2019-09-10 MED FILL — HYDROCHLOROTHIAZIDE 25 MG T: 25 | 90 days supply | Qty: 90 | Fill #0

## 2019-09-21 ENCOUNTER — Ambulatory Visit (INDEPENDENT_AMBULATORY_CARE_PROVIDER_SITE_OTHER): Payer: BC Managed Care – PPO | Admitting: Family Medicine

## 2019-09-21 ENCOUNTER — Encounter (INDEPENDENT_AMBULATORY_CARE_PROVIDER_SITE_OTHER): Payer: Self-pay | Admitting: Family Medicine

## 2019-09-21 ENCOUNTER — Other Ambulatory Visit: Payer: Self-pay

## 2019-09-21 VITALS — BP 142/81 | HR 101 | Temp 99.3°F | Ht 69.0 in | Wt 317.0 lb

## 2019-09-21 DIAGNOSIS — I10 Essential (primary) hypertension: Secondary | ICD-10-CM | POA: Diagnosis not present

## 2019-09-21 DIAGNOSIS — Z6841 Body Mass Index (BMI) 40.0 and over, adult: Secondary | ICD-10-CM | POA: Diagnosis not present

## 2019-09-22 ENCOUNTER — Encounter (INDEPENDENT_AMBULATORY_CARE_PROVIDER_SITE_OTHER): Payer: Self-pay | Admitting: Family Medicine

## 2019-09-22 NOTE — Progress Notes (Signed)
Chief Complaint:   OBESITY Brandi Lester Brandi Lester is here to discuss her progress with her obesity treatment plan along with follow-up of her obesity related diagnoses. Brandi Lester is on the Category 4 Plan and states she is following her eating plan approximately 75% of the time. Brandi Lester states she is exercising 0 minutes 0 times per week.  Today's visit was #: 49 Starting weight: 325 lbs Starting date: 05/23/2017 Today's weight: 317 lbs Today's date: 09/21/2019 Total lbs lost to date: 8 Total lbs lost since last in-office visit: 3  Interim History: Brandi Lester is not doing any formal type of exercise, but she is getting up to 12,000 steps in the course of a day. She is unable to eat lunch due to her school schedule and fear of COVID. [name] She is supplementing protein with shakes or bars ([employer]). Without the protein bar she would not be able to eat between breakfast and supper.  She sticks to the plan well on weekends.  Subjective:   Essential hypertension Brandi Lester blood pressure is slightly elevated today. BP has not been well controlled. She is currently taking amlodipine 10 mg, HCTZ 25 mg, and losartan 100 mg daily. Her Bupropion dose was decreased at the last visit to 150 mg daily in case this was causing the elevation in BP. However, she has not switched from 200 mg to 150 mg yet.  BP Readings from Last 3 Encounters:  09/21/19 (!) 142/81  08/17/19 (!) 146/87  07/27/19 (!) 144/84   Lab Results  Component Value Date   CREATININE 0.60 07/07/2019   CREATININE 0.73 11/20/2018   CREATININE 0.74 07/14/2018    Assessment/Plan:   Essential hypertension Brandi Lester is working on healthy weight loss and exercise to improve blood pressure control. Continue all current medications.   Obesity Brandi Lester is currently in the action stage of change. As such, her goal is to continue with weight loss efforts. She has agreed to the Category 4 Plan or keeping a food journal and adhering to  recommended goals of 1800 to 1900 calories and 110 grams of protein daily.   Exercise goals: Continue current regimen  Behavioral modification strategies: increasing lean protein intake, meal planning and cooking strategies and planning for success. We discussed having a protein bar with at least 20 grams of protein and between 200 and 300 calories.  Brandi Lester has agreed to follow-up with our clinic in 3 weeks. She was informed of the importance of frequent follow-up visits to maximize her success with intensive lifestyle modifications for her multiple health conditions.   Objective:   Blood pressure (!) 142/81, pulse (!) 101, temperature 99.3 F (37.4 C), temperature source Oral, height 5\' 9"  (1.753 m), weight (!) 317 lb (143.8 kg), SpO2 99 %. Body mass index is 46.81 kg/m.  General: Cooperative, alert, well developed, in no acute distress. HEENT: Conjunctivae and lids unremarkable. Cardiovascular: Regular rhythm.  Lungs: Normal work of breathing. Neurologic: No focal deficits.   Lab Results  Component Value Date   CREATININE 0.60 07/07/2019   BUN 10 07/07/2019   NA 139 07/07/2019   K 3.6 07/07/2019   CL 101 07/07/2019   CO2 24 07/07/2019   Lab Results  Component Value Date   ALT 9 07/07/2019   AST 11 07/07/2019   ALKPHOS 84 07/07/2019   BILITOT 0.2 07/07/2019   Lab Results  Component Value Date   HGBA1C 5.9 (H) 07/07/2019   HGBA1C 6.1 (H) 11/20/2018   HGBA1C 5.9 (H) 07/14/2018  HGBA1C 5.9 03/13/2018   HGBA1C 5.8 (H) 09/12/2017   Lab Results  Component Value Date   INSULIN 12.9 07/07/2019   INSULIN 21.2 11/20/2018   INSULIN 15.4 07/14/2018   INSULIN 12.9 09/12/2017   INSULIN 31.1 (H) 05/23/2017   Lab Results  Component Value Date   TSH 1.410 07/07/2019   Lab Results  Component Value Date   CHOL 129 07/07/2019   HDL 33 (L) 07/07/2019   LDLCALC 76 07/07/2019   TRIG 106 07/07/2019   CHOLHDL 3 03/13/2018   Lab Results  Component Value Date   WBC 7.6  07/07/2019   HGB 12.3 07/07/2019   HCT 37.3 07/07/2019   MCV 84 07/07/2019   PLT 304 07/07/2019   No results found for: IRON, TIBC, FERRITIN   Ref. Range 07/07/2019 09:00  Vitamin D, 25-Hydroxy Latest Ref Range: 30.0 - 100.0 ng/mL 49.4    Attestation Statements:   Reviewed by clinician on day of visit: allergies, medications, problem list, medical history, surgical history, family history, social history, and previous encounter notes.  Corey Skains, am acting as Location manager for [employer], FNP-C.  I have reviewed the above documentation for accuracy and completeness, and I agree with the above. -   [employer], FNP-C

## 2019-10-08 MED FILL — ATORVASTATIN 10 MG TABLET: 10 | 30 days supply | Qty: 15 | Fill #2

## 2019-10-08 MED FILL — MEDROXYPROGESTERONE 10 MG T: 10 | 90 days supply | Qty: 90 | Fill #0

## 2019-10-13 ENCOUNTER — Encounter (INDEPENDENT_AMBULATORY_CARE_PROVIDER_SITE_OTHER): Payer: Self-pay

## 2019-10-15 ENCOUNTER — Other Ambulatory Visit: Payer: Self-pay

## 2019-10-15 ENCOUNTER — Ambulatory Visit (INDEPENDENT_AMBULATORY_CARE_PROVIDER_SITE_OTHER): Payer: BC Managed Care – PPO | Admitting: Family Medicine

## 2019-10-15 ENCOUNTER — Encounter (INDEPENDENT_AMBULATORY_CARE_PROVIDER_SITE_OTHER): Payer: Self-pay | Admitting: Family Medicine

## 2019-10-15 VITALS — BP 139/85 | HR 100 | Temp 99.0°F | Ht 69.0 in | Wt 315.0 lb

## 2019-10-15 DIAGNOSIS — Z6841 Body Mass Index (BMI) 40.0 and over, adult: Secondary | ICD-10-CM | POA: Diagnosis not present

## 2019-10-15 DIAGNOSIS — I10 Essential (primary) hypertension: Secondary | ICD-10-CM | POA: Diagnosis not present

## 2019-10-19 ENCOUNTER — Encounter (INDEPENDENT_AMBULATORY_CARE_PROVIDER_SITE_OTHER): Payer: Self-pay | Admitting: Family Medicine

## 2019-10-19 NOTE — Progress Notes (Signed)
Chief Complaint:   OBESITY Brandi Lester is here to discuss her progress with her obesity treatment plan along with follow-up of her obesity related diagnoses. Brandi Lester is on the keeping a food journal of 1800 to 1900 calories and 110 grams of protein daily plan and states she is following her eating plan approximately 75% of the time. Brandi Lester states she is walking and biking 45 minutes 3 times per week.  Today's visit was #: 60 Starting weight: 325 lbs Starting date: 05/23/2017 Today's weight: 315 lbs Today's date: 10/15/2019 Total lbs lost to date: 10 Total lbs lost since last in-office visit: 2  Interim History: Brandi Lester is having a protein bar or shake for lunch due to the inability to eat lunch during the school day because of COVID. She is hitting her protein goals consistently. She hits her calorie goals four days per week. She journals fairly consistently.  Subjective:   Essential hypertension Brandi Lester's blood pressure is better controlled today. Her Bupropion dose was decreased due to elevated blood pressure at the last visit.  BP Readings from Last 3 Encounters:  10/15/19 139/85  09/21/19 (!) 142/81  08/17/19 (!) 146/87   Lab Results  Component Value Date   CREATININE 0.60 07/07/2019   CREATININE 0.73 11/20/2018   CREATININE 0.74 07/14/2018    Assessment/Plan:   Essential hypertension Brandi Lester is working on healthy weight loss and exercise to improve blood pressure control. Brandi Lester will continue all medications. We will watch for signs of hypotension as she continues her lifestyle modifications.  Class 3 severe obesity with serious comorbidity and body mass index (BMI) of 45.0 to 49.9 in adult, unspecified obesity type ([facility]) Brandi Lester is currently in the action stage of change. As such, her goal is to continue with weight loss efforts. She has agreed to the Category 4 Plan.   Exercise goals: Brandi Lester will continue walking and biking for 45 minutes, 3 times  per week.  Behavioral modification strategies: increasing lean protein intake, decreasing simple carbohydrates, planning for success and keeping a strict food journal.  Brandi Lester has agreed to follow-up with our clinic in 3 weeks. She was informed of the importance of frequent follow-up visits to maximize her success with intensive lifestyle modifications for her multiple health conditions.   Objective:   Blood pressure 139/85, pulse 100, temperature 99 F (37.2 C), temperature source Oral, height 5\' 9"  (1.753 m), weight (!) 315 lb (142.9 kg), SpO2 99 %. Body mass index is 46.52 kg/m.  General: Cooperative, alert, well developed, in no acute distress. HEENT: Conjunctivae and lids unremarkable. Cardiovascular: Regular rhythm.  Lungs: Normal work of breathing. Neurologic: No focal deficits.   Lab Results  Component Value Date   CREATININE 0.60 07/07/2019   BUN 10 07/07/2019   NA 139 07/07/2019   K 3.6 07/07/2019   CL 101 07/07/2019   CO2 24 07/07/2019   Lab Results  Component Value Date   ALT 9 07/07/2019   AST 11 07/07/2019   ALKPHOS 84 07/07/2019   BILITOT 0.2 07/07/2019   Lab Results  Component Value Date   HGBA1C 5.9 (H) 07/07/2019   HGBA1C 6.1 (H) 11/20/2018   HGBA1C 5.9 (H) 07/14/2018   HGBA1C 5.9 03/13/2018   HGBA1C 5.8 (H) 09/12/2017   Lab Results  Component Value Date   INSULIN 12.9 07/07/2019   INSULIN 21.2 11/20/2018   INSULIN 15.4 07/14/2018   INSULIN 12.9 09/12/2017   INSULIN 31.1 (H) 05/23/2017   Lab Results  Component Value  Date   TSH 1.410 07/07/2019   Lab Results  Component Value Date   CHOL 129 07/07/2019   HDL 33 (L) 07/07/2019   LDLCALC 76 07/07/2019   TRIG 106 07/07/2019   CHOLHDL 3 03/13/2018   Lab Results  Component Value Date   WBC 7.6 07/07/2019   HGB 12.3 07/07/2019   HCT 37.3 07/07/2019   MCV 84 07/07/2019   PLT 304 07/07/2019   No results found for: IRON, TIBC, FERRITIN   Ref. Range 07/07/2019 09:00  Vitamin D,  25-Hydroxy Latest Ref Range: 30.0 - 100.0 ng/mL 49.4    Attestation Statements:   Reviewed by clinician on day of visit: allergies, medications, problem list, medical history, surgical history, family history, social history, and previous encounter notes.  Corey Skains, am acting as Location manager for [employer], FNP-C.  I have reviewed the above documentation for accuracy and completeness, and I agree with the above. -  Eldoris Beiser [employer], FNP-C

## 2019-10-31 ENCOUNTER — Ambulatory Visit: Payer: BC Managed Care – PPO | Attending: Internal Medicine

## 2019-10-31 DIAGNOSIS — Z23 Encounter for immunization: Secondary | ICD-10-CM

## 2019-10-31 NOTE — Progress Notes (Signed)
   Covid-19 Vaccination Clinic  Name:  Yannet Norell    MRN: [id number] DOB: 1965-09-23  10/31/2019  Ms. Mcclarin was observed post Covid-19 immunization for 30 minutes based on pre-vaccination screening without incidence. She was provided with Vaccine Information Sheet and instruction to access the V-Safe system.   Ms. Schmal was instructed to call [phone] with any severe reactions post vaccine: [name] Difficulty breathing  . Swelling of your face and throat  . A fast heartbeat  . A bad rash all over your body  . Dizziness and weakness    Immunizations Administered    Name Date Dose VIS Date Route   Pfizer COVID-19 Vaccine 10/31/2019 11:54 AM 0.3 mL 08/14/2019 Intramuscular   Manufacturer: [place]   Lot: [id number]   [place]: [id number]

## 2019-11-04 ENCOUNTER — Encounter (INDEPENDENT_AMBULATORY_CARE_PROVIDER_SITE_OTHER): Payer: Self-pay | Admitting: Family Medicine

## 2019-11-04 ENCOUNTER — Other Ambulatory Visit: Payer: Self-pay

## 2019-11-04 ENCOUNTER — Ambulatory Visit (INDEPENDENT_AMBULATORY_CARE_PROVIDER_SITE_OTHER): Payer: BC Managed Care – PPO | Admitting: Family Medicine

## 2019-11-04 VITALS — BP 145/81 | HR 103 | Temp 98.9°F | Ht 69.0 in | Wt 315.0 lb

## 2019-11-04 DIAGNOSIS — F3289 Other specified depressive episodes: Secondary | ICD-10-CM

## 2019-11-04 DIAGNOSIS — Z6841 Body Mass Index (BMI) 40.0 and over, adult: Secondary | ICD-10-CM

## 2019-11-04 DIAGNOSIS — I10 Essential (primary) hypertension: Secondary | ICD-10-CM | POA: Diagnosis not present

## 2019-11-04 DIAGNOSIS — Z9189 Other specified personal risk factors, not elsewhere classified: Secondary | ICD-10-CM | POA: Diagnosis not present

## 2019-11-04 MED ORDER — HYDROCHLOROTHIAZIDE 25 MG PO TABS: 25.0000 mg | ORAL_TABLET | Freq: Every day | ORAL | 0 refills | Status: DC

## 2019-11-04 MED ORDER — BUPROPION HCL ER (SR) 150 MG PO TB12: 150.0000 mg | ORAL_TABLET | Freq: Every day | ORAL | 0 refills | Status: DC

## 2019-11-04 MED ORDER — LOSARTAN POTASSIUM 100 MG PO TABS: 100.0000 mg | ORAL_TABLET | Freq: Every day | ORAL | 0 refills | Status: DC

## 2019-11-04 MED ORDER — METOPROLOL SUCCINATE ER 25 MG PO TB24: 25.0000 mg | ORAL_TABLET | Freq: Every day | ORAL | 0 refills | Status: DC

## 2019-11-04 MED ORDER — AMLODIPINE BESYLATE 10 MG PO TABS: 10.0000 mg | ORAL_TABLET | Freq: Every day | ORAL | 0 refills | Status: DC

## 2019-11-04 MED FILL — BUPROPION HCL ER (SR) 150 M: 150 | 90 days supply | Qty: 90 | Fill #0

## 2019-11-04 MED FILL — METOPROLOL SUCCINATE ER 25: 25 | 30 days supply | Qty: 30 | Fill #0

## 2019-11-04 NOTE — Progress Notes (Signed)
Chief Complaint:   OBESITY Brandi Lester Brandi Lester is here to discuss her progress with her obesity treatment plan along with follow-up of her obesity related diagnoses. Brandi Lester is keeping a food journal and adhering to recommended goals of 1800 calories and 110 grams of protein and states she is following her eating plan approximately 80% of the time. Brandi Lester states she is exercising on exercise bike/soccer 20 minutes 3 times per week.  Today's visit was #: 35 Starting weight: 325 lbs Starting date: 05/23/2017 Today's weight: 315 lbs Today's date: 11/04/2019 Total lbs lost to date: 10 Total lbs lost since last in-office visit: 0  Interim History: Brandi Lester would like to make a fresh start with a category plan. She wants more structure and feels this might help.  Subjective:   Other depression, with emotional eating. Brandi Lester is struggling with emotional eating and using food for comfort to the extent that it is negatively impacting her health. She has been working on behavior modification techniques to help reduce her emotional eating and has been somewhat successful. She shows no sign of suicidal or homicidal ideations. Brandi Lester feels stress eating is controlled fairly well with bupropion.  Essential hypertension. Blood pressure is not well controlled. She is on  max dose of Losartan, HCTZ, and amlodipine.  BP Readings from Last 3 Encounters:  11/04/19 (!) 145/81  10/15/19 139/85  09/21/19 (!) 142/81   Lab Results  Component Value Date   CREATININE 0.60 07/07/2019   CREATININE 0.73 11/20/2018   CREATININE 0.74 07/14/2018   At risk for heart disease. Brandi Lester is at a higher than average risk for cardiovascular disease due to obesity.   Assessment/Plan:   Other depression, with emotional eating. Behavior modification techniques were discussed today to help Brandi Lester deal with her emotional/non-hunger eating behaviors.  Orders and follow up as documented in patient record. Brandi Lester  was given a refill on her buPROPion (WELLBUTRIN SR) 150 MG 12 hr tablet QD.  Essential hypertension. Brandi Lester is working on healthy weight loss and exercise to improve blood pressure control. We will watch for signs of hypotension as she continues her lifestyle modifications. Brandi Lester was given a new prescription for metoprolol succinate (TOPROL-XL) 25 MG 24 hr tablet QD #30 with 0 refills. She was given refills on her amLODipine (NORVASC) 10 MG tablet QD #30 with 0 refills, hydrochlorothiazide (HYDRODIURIL) 25 MG tablet QD #30 with 0 refills, and losartan (COZAAR) 100 MG tablet QHS #30 with 0 refills.  At risk for heart disease. Brandi Lester was given approximately 15 minutes of coronary artery disease prevention counseling today. She is 54 y.o. female and has risk factors for heart disease including obesity. We discussed intensive lifestyle modifications today with an emphasis on specific weight loss instructions and strategies.   Repetitive spaced learning was employed today to elicit superior memory formation and behavioral change.  Class 3 severe obesity with serious comorbidity and body mass index (BMI) of 45.0 to 49.9 in adult, unspecified obesity type ([place]).  Brandi Lester is currently in the action stage of change. As such, her goal is to continue with weight loss efforts. She has agreed to the Category 4 Plan.   Handout was given on Category 4 plan and microwave meals.   Exercise goals: will continue current exercise regimen for weight loss and overall health benefits.  Behavioral modification strategies: increasing lean protein intake, decreasing simple carbohydrates and planning for success.  Brandi Lester has agreed to follow-up with our clinic in 3 weeks. She was informed of  the importance of frequent follow-up visits to maximize her success with intensive lifestyle modifications for her multiple health conditions.   Objective:   Blood pressure (!) 145/81, pulse (!) 103, temperature 98.9 F (37.2  C), temperature source Oral, height 5\' 9"  (1.753 m), weight (!) 315 lb (142.9 kg), SpO2 97 %. Body mass index is 46.52 kg/m.  General: Cooperative, alert, well developed, in no acute distress. HEENT: Conjunctivae and lids unremarkable. Cardiovascular: Regular rhythm.  Lungs: Normal work of breathing. Neurologic: No focal deficits.   Lab Results  Component Value Date   CREATININE 0.60 07/07/2019   BUN 10 07/07/2019   NA 139 07/07/2019   K 3.6 07/07/2019   CL 101 07/07/2019   CO2 24 07/07/2019   Lab Results  Component Value Date   ALT 9 07/07/2019   AST 11 07/07/2019   ALKPHOS 84 07/07/2019   BILITOT 0.2 07/07/2019   Lab Results  Component Value Date   HGBA1C 5.9 (H) 07/07/2019   HGBA1C 6.1 (H) 11/20/2018   HGBA1C 5.9 (H) 07/14/2018   HGBA1C 5.9 03/13/2018   HGBA1C 5.8 (H) 09/12/2017   Lab Results  Component Value Date   INSULIN 12.9 07/07/2019   INSULIN 21.2 11/20/2018   INSULIN 15.4 07/14/2018   INSULIN 12.9 09/12/2017   INSULIN 31.1 (H) 05/23/2017   Lab Results  Component Value Date   TSH 1.410 07/07/2019   Lab Results  Component Value Date   CHOL 129 07/07/2019   HDL 33 (L) 07/07/2019   LDLCALC 76 07/07/2019   TRIG 106 07/07/2019   CHOLHDL 3 03/13/2018   Lab Results  Component Value Date   WBC 7.6 07/07/2019   HGB 12.3 07/07/2019   HCT 37.3 07/07/2019   MCV 84 07/07/2019   PLT 304 07/07/2019   No results found for: IRON, TIBC, FERRITIN  Attestation Statements:   Reviewed by clinician on day of visit: allergies, medications, problem list, medical history, surgical history, family history, social history, and previous encounter notes.  IMichaelene Song, am acting as Location manager for [employer], FNP   I have reviewed the above documentation for accuracy and completeness, and I agree with the above. -  Georgianne Fick, FNP

## 2019-11-09 ENCOUNTER — Other Ambulatory Visit: Payer: Self-pay | Admitting: Family Medicine

## 2019-11-09 MED FILL — ATORVASTATIN 10 MG TABLET: 10 | 30 days supply | Qty: 15 | Fill #0

## 2019-11-21 ENCOUNTER — Ambulatory Visit: Payer: BC Managed Care – PPO | Attending: Internal Medicine

## 2019-11-21 DIAGNOSIS — Z23 Encounter for immunization: Secondary | ICD-10-CM

## 2019-11-21 NOTE — Progress Notes (Signed)
   Covid-19 Vaccination Clinic  Name:  Brandi Lester    MRN: [id number] DOB: [birth year]  11/21/2019  Brandi Lester was observed post Covid-19 immunization for 30 minutes based on pre-vaccination screening without incident. She was provided with Vaccine Information Sheet and instruction to access the V-Safe system.   Brandi Lester was instructed to call 911 with any severe reactions post vaccine: [name] Difficulty breathing  . Swelling of face and throat  . A fast heartbeat  . A bad rash all over body  . Dizziness and weakness   Immunizations Administered    Name Date Dose VIS Date Route   Pfizer COVID-19 Vaccine 11/21/2019 12:52 PM 0.3 mL 08/14/2019 Intramuscular   Manufacturer: [place]   Lot: [id number]   [place]: [id number]

## 2019-11-25 ENCOUNTER — Ambulatory Visit (INDEPENDENT_AMBULATORY_CARE_PROVIDER_SITE_OTHER): Payer: BC Managed Care – PPO | Admitting: Family Medicine

## 2019-11-25 ENCOUNTER — Ambulatory Visit: Payer: BC Managed Care – PPO

## 2019-12-01 ENCOUNTER — Encounter (INDEPENDENT_AMBULATORY_CARE_PROVIDER_SITE_OTHER): Payer: Self-pay | Admitting: Family Medicine

## 2019-12-01 ENCOUNTER — Ambulatory Visit (INDEPENDENT_AMBULATORY_CARE_PROVIDER_SITE_OTHER): Payer: BC Managed Care – PPO | Admitting: Family Medicine

## 2019-12-01 ENCOUNTER — Other Ambulatory Visit: Payer: Self-pay

## 2019-12-01 VITALS — BP 128/71 | HR 89 | Temp 98.6°F | Ht 69.0 in | Wt 309.0 lb

## 2019-12-01 DIAGNOSIS — E8881 Metabolic syndrome: Secondary | ICD-10-CM

## 2019-12-01 DIAGNOSIS — I10 Essential (primary) hypertension: Secondary | ICD-10-CM

## 2019-12-01 DIAGNOSIS — Z6841 Body Mass Index (BMI) 40.0 and over, adult: Secondary | ICD-10-CM

## 2019-12-01 DIAGNOSIS — E559 Vitamin D deficiency, unspecified: Secondary | ICD-10-CM | POA: Diagnosis not present

## 2019-12-01 DIAGNOSIS — Z9189 Other specified personal risk factors, not elsewhere classified: Secondary | ICD-10-CM

## 2019-12-01 MED ORDER — METFORMIN HCL 500 MG PO TABS: 500.0000 mg | ORAL_TABLET | Freq: Two times a day (BID) | ORAL | 0 refills | Status: DC

## 2019-12-01 MED ORDER — METOPROLOL SUCCINATE ER 25 MG PO TB24: 25.0000 mg | ORAL_TABLET | Freq: Every day | ORAL | 0 refills | Status: DC

## 2019-12-01 MED FILL — AMLODIPINE BESYLATE 10 MG T: 10 | 90 days supply | Qty: 90 | Fill #1

## 2019-12-01 MED FILL — METFORMIN HCL 500 MG TABS: 500 | 30 days supply | Qty: 60 | Fill #0

## 2019-12-01 MED FILL — METOPROLOL SUCCINATE ER 25: 25 | 30 days supply | Qty: 30 | Fill #0

## 2019-12-01 MED FILL — HYDROCHLOROTHIAZIDE 25 MG T: 25 | 90 days supply | Qty: 90 | Fill #1

## 2019-12-01 MED FILL — LOSARTAN POTASSIUM 100 MG T: 100 | 90 days supply | Qty: 90 | Fill #1

## 2019-12-01 NOTE — Progress Notes (Signed)
Chief Complaint:   OBESITY Brandi Lester is here to discuss her progress with her obesity treatment plan along with follow-up of her obesity related diagnoses. Brandi Lester is on the Category 4 Plan and states she is following her eating plan approximately 75% of the time. Brandi Lester states she is walking and riding stationary bike for 25-30 minutes 3-4 times per week.  Today's visit was #: 66 Starting weight: 325 lbs Starting date: 05/23/2017 Today's weight: 309 lbs Today's date: 12/01/2019 Total lbs lost to date: 16 Total lbs lost since last in-office visit: 6  Interim History: Brandi Lester was changed to Category 4 meal plan from journaling at her last OV and she has lost 6 lbs. She likes the structure of cat 4 plan.  Subjective:   1. Essential hypertension Brandi Lester's blood pressure has much improved with addition of metoprolol at last OV. She denies chest pain or shortness of breath. BP Readings from Last 3 Encounters:  12/01/19 128/71  11/04/19 (!) 145/81  10/15/19 139/85    2. Insulin resistance Brandi Lester denies polyphagia. She is on metformin BID. Lab Results  Component Value Date   HGBA1C 5.9 (H) 07/07/2019    3. Vitamin D deficiency Brandi Lester's Vit D level is nearly at goal. She is on OTC Vit D 4,000 IU daily.  4. At risk for heart disease Brandi Lester is at a higher than average risk for cardiovascular disease due to obesity, low HDL,  and HTN.   Assessment/Plan:   1. Essential hypertension Brandi Lester is working on healthy weight loss and exercise to improve blood pressure control. We will watch for signs of hypotension as she continues her lifestyle modifications. We will refill metoprolol for 1 month.  - metoprolol succinate (TOPROL-XL) 25 MG 24 hr tablet; Take 1 tablet (25 mg total) by mouth daily.  Dispense: 30 tablet; Refill: 0 - Comprehensive metabolic panel  2. Insulin resistance Brandi Lester will continue to work on weight loss, exercise, and decreasing simple carbohydrates to help  decrease the risk of diabetes. We will refill metformin for 1 month. We will check labs today.  Brandi Lester agreed to follow-up with [name] as directed to closely monitor her progress.  - metFORMIN (GLUCOPHAGE) 500 MG tablet; Take 1 tablet (500 mg total) by mouth 2 (two) times daily with a meal.  Dispense: 60 tablet; Refill: 0  - Hemoglobin A1c - Insulin, random  3. Vitamin D deficiency Low Vitamin D level contributes to fatigue and are associated with obesity, breast, and colon cancer. Brandi Lester agreed to continue taking OTC Vitamin D and will follow-up for routine testing of Vitamin D, at least 2-3 times per year to avoid over-replacement. We will check labs today.  - VITAMIN D 25 Hydroxy (Vit-D Deficiency, Fractures)  4. At risk for heart disease Brandi Lester was given approximately 15 minutes of coronary artery disease prevention counseling today. She is 54 y.o. female and has risk factors for heart disease including obesity. We discussed intensive lifestyle modifications today with an emphasis on specific weight loss instructions and strategies.   Repetitive spaced learning was employed today to elicit superior memory formation and behavioral change.  5. Class 3 severe obesity with serious comorbidity and body mass index (BMI) of 45.0 to 49.9 in adult, unspecified obesity type (H[facility]) Brandi Lester is currently in the action stage of change. As such, her goal is to continue with weight loss efforts. She has agreed to the Category 4 Plan.   Handout given today: Protein Equivalents.  Exercise goals: As is.  Behavioral modification  strategies: holiday eating strategies  and planning for success.  Brandi Lester has agreed to follow-up with our clinic in 3 weeks. She was informed of the importance of frequent follow-up visits to maximize her success with intensive lifestyle modifications for her multiple health conditions.   Brandi Lester was informed we would discuss her lab results at her next visit unless there is a  critical issue that needs to be addressed sooner. Brandi Lester agreed to keep her next visit at the agreed upon time to discuss these results.  Objective:   Blood pressure 128/71, pulse 89, temperature 98.6 F (37 C), temperature source Oral, height 5\' 9"  (1.753 m), weight (!) 309 lb (140.2 kg), SpO2 99 %. Body mass index is 45.63 kg/m.  General: Cooperative, alert, well developed, in no acute distress. HEENT: Conjunctivae and lids unremarkable. Cardiovascular: Regular rhythm.  Lungs: Normal work of breathing. Neurologic: No focal deficits.   Lab Results  Component Value Date   CREATININE 0.60 07/07/2019   BUN 10 07/07/2019   NA 139 07/07/2019   K 3.6 07/07/2019   CL 101 07/07/2019   CO2 24 07/07/2019   Lab Results  Component Value Date   ALT 9 07/07/2019   AST 11 07/07/2019   ALKPHOS 84 07/07/2019   BILITOT 0.2 07/07/2019   Lab Results  Component Value Date   HGBA1C 5.9 (H) 07/07/2019   HGBA1C 6.1 (H) 11/20/2018   HGBA1C 5.9 (H) 07/14/2018   HGBA1C 5.9 03/13/2018   HGBA1C 5.8 (H) 09/12/2017   Lab Results  Component Value Date   INSULIN 12.9 07/07/2019   INSULIN 21.2 11/20/2018   INSULIN 15.4 07/14/2018   INSULIN 12.9 09/12/2017   INSULIN 31.1 (H) 05/23/2017   Lab Results  Component Value Date   TSH 1.410 07/07/2019   Lab Results  Component Value Date   CHOL 129 07/07/2019   HDL 33 (L) 07/07/2019   LDLCALC 76 07/07/2019   TRIG 106 07/07/2019   CHOLHDL 3 03/13/2018   Lab Results  Component Value Date   WBC 7.6 07/07/2019   HGB 12.3 07/07/2019   HCT 37.3 07/07/2019   MCV 84 07/07/2019   PLT 304 07/07/2019   No results found for: IRON, TIBC, FERRITIN  Attestation Statements:   Reviewed by clinician on day of visit: allergies, medications, problem list, medical history, surgical history, family history, social history, and previous encounter notes.   Wilhemena Durie, am acting as Location manager for [employer], FNP-C.  I have reviewed the above  documentation for accuracy and completeness, and I agree with the above. -  Georgianne Fick, FNP

## 2019-12-02 LAB — COMPREHENSIVE METABOLIC PANEL
ALT: 11 IU/L (ref 0–32)
AST: 11 IU/L (ref 0–40)
Albumin/Globulin Ratio: 1.5 (ref 1.2–2.2)
Albumin: 4.2 g/dL (ref 3.8–4.9)
Alkaline Phosphatase: 86 IU/L (ref 39–117)
BUN/Creatinine Ratio: 14 (ref 9–23)
BUN: 11 mg/dL (ref 6–24)
Bilirubin Total: 0.3 mg/dL (ref 0.0–1.2)
CO2: 22 mmol/L (ref 20–29)
Calcium: 9.3 mg/dL (ref 8.7–10.2)
Chloride: 103 mmol/L (ref 96–106)
Creatinine, Ser: 0.8 mg/dL (ref 0.57–1.00)
GFR calc Af Amer: 99 mL/min/{1.73_m2} (ref >59)
GFR calc non Af Amer: 86 mL/min/{1.73_m2} (ref >59)
Globulin, Total: 2.8 g/dL (ref 1.5–4.5)
Glucose: 99 mg/dL (ref 65–99)
Potassium: 4.1 mmol/L (ref 3.5–5.2)
Sodium: 141 mmol/L (ref 134–144)
Total Protein: 7 g/dL (ref 6.0–8.5)

## 2019-12-02 LAB — HEMOGLOBIN A1C
Est. average glucose Bld gHb Est-mCnc: 123 mg/dL
Hgb A1c MFr Bld: 5.9 % — ABNORMAL HIGH (ref 4.8–5.6)

## 2019-12-02 LAB — VITAMIN D 25 HYDROXY (VIT D DEFICIENCY, FRACTURES): Vit D, 25-Hydroxy: 61.8 ng/mL (ref 30.0–100.0)

## 2019-12-02 LAB — INSULIN, RANDOM: INSULIN: 20.7 u[IU]/mL (ref 2.6–24.9)

## 2019-12-07 ENCOUNTER — Encounter: Payer: Self-pay | Admitting: Family Medicine

## 2019-12-10 ENCOUNTER — Other Ambulatory Visit: Payer: Self-pay

## 2019-12-10 MED FILL — ATORVASTATIN 10 MG TABLET: 10 | 30 days supply | Qty: 15 | Fill #1

## 2019-12-11 ENCOUNTER — Other Ambulatory Visit: Payer: Self-pay

## 2019-12-11 ENCOUNTER — Encounter: Payer: Self-pay | Admitting: Family Medicine

## 2019-12-11 ENCOUNTER — Ambulatory Visit: Payer: BC Managed Care – PPO | Admitting: Family Medicine

## 2019-12-11 VITALS — BP 140/88 | HR 95 | Temp 97.4°F | Resp 18 | Ht 69.0 in | Wt 317.6 lb

## 2019-12-11 DIAGNOSIS — S134XXA Sprain of ligaments of cervical spine, initial encounter: Secondary | ICD-10-CM | POA: Diagnosis not present

## 2019-12-11 MED ORDER — CYCLOBENZAPRINE HCL 5 MG PO TABS: 5.0000 mg | ORAL_TABLET | Freq: Three times a day (TID) | ORAL | 1 refills | Status: DC | PRN

## 2019-12-11 MED FILL — CYCLOBENZAPRINE HCL 5 MG TA: 5 | 10 days supply | Qty: 30 | Fill #0
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
## 2019-12-11 NOTE — Patient Instructions (Signed)

## 2019-12-23 ENCOUNTER — Ambulatory Visit (INDEPENDENT_AMBULATORY_CARE_PROVIDER_SITE_OTHER): Payer: BC Managed Care – PPO | Admitting: Family Medicine

## 2019-12-23 ENCOUNTER — Encounter (INDEPENDENT_AMBULATORY_CARE_PROVIDER_SITE_OTHER): Payer: Self-pay | Admitting: Family Medicine

## 2019-12-23 ENCOUNTER — Other Ambulatory Visit: Payer: Self-pay

## 2019-12-23 VITALS — BP 150/82 | HR 93 | Temp 98.6°F | Ht 69.0 in | Wt 313.0 lb

## 2019-12-23 DIAGNOSIS — E1159 Type 2 diabetes mellitus with other circulatory complications: Secondary | ICD-10-CM

## 2019-12-23 DIAGNOSIS — Z9189 Other specified personal risk factors, not elsewhere classified: Secondary | ICD-10-CM | POA: Diagnosis not present

## 2019-12-23 DIAGNOSIS — Z6841 Body Mass Index (BMI) 40.0 and over, adult: Secondary | ICD-10-CM

## 2019-12-23 DIAGNOSIS — E8881 Metabolic syndrome: Secondary | ICD-10-CM

## 2019-12-23 DIAGNOSIS — E119 Type 2 diabetes mellitus without complications: Secondary | ICD-10-CM

## 2019-12-23 DIAGNOSIS — E1169 Type 2 diabetes mellitus with other specified complication: Secondary | ICD-10-CM | POA: Diagnosis not present

## 2019-12-23 DIAGNOSIS — I152 Hypertension secondary to endocrine disorders: Secondary | ICD-10-CM

## 2019-12-23 DIAGNOSIS — I10 Essential (primary) hypertension: Secondary | ICD-10-CM

## 2019-12-23 MED ORDER — LOSARTAN POTASSIUM 100 MG PO TABS: 100.0000 mg | ORAL_TABLET | Freq: Every day | ORAL | 0 refills | Status: DC

## 2019-12-23 MED ORDER — AMLODIPINE BESYLATE 10 MG PO TABS: 10.0000 mg | ORAL_TABLET | Freq: Every day | ORAL | 0 refills | Status: DC

## 2019-12-23 MED ORDER — HYDROCHLOROTHIAZIDE 25 MG PO TABS: 25.0000 mg | ORAL_TABLET | Freq: Every day | ORAL | 0 refills | Status: DC

## 2019-12-23 MED ORDER — METOPROLOL SUCCINATE ER 25 MG PO TB24: 25.0000 mg | ORAL_TABLET | Freq: Every day | ORAL | 0 refills | Status: DC

## 2019-12-23 MED ORDER — METFORMIN HCL 500 MG PO TABS: 500.0000 mg | ORAL_TABLET | Freq: Two times a day (BID) | ORAL | 0 refills | Status: DC

## 2019-12-24 MED FILL — POTASSIUM CHLORIDE CRYS ER: 20 | 90 days supply | Qty: 270 | Fill #1

## 2019-12-24 MED FILL — METFORMIN HCL 500 MG TABS: 500 | 30 days supply | Qty: 60 | Fill #0

## 2019-12-24 MED FILL — METOPROLOL SUCCINATE ER 25: 25 | 30 days supply | Qty: 30 | Fill #0

## 2019-12-28 ENCOUNTER — Encounter (INDEPENDENT_AMBULATORY_CARE_PROVIDER_SITE_OTHER): Payer: Self-pay | Admitting: Family Medicine
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
## 2019-12-30 ENCOUNTER — Encounter (INDEPENDENT_AMBULATORY_CARE_PROVIDER_SITE_OTHER): Payer: Self-pay | Admitting: Family Medicine

## 2019-12-30 NOTE — Telephone Encounter (Signed)
FYI

## 2020-01-12 ENCOUNTER — Ambulatory Visit (INDEPENDENT_AMBULATORY_CARE_PROVIDER_SITE_OTHER): Payer: BC Managed Care – PPO | Admitting: Family Medicine

## 2020-01-14 ENCOUNTER — Other Ambulatory Visit: Payer: Self-pay

## 2020-01-14 ENCOUNTER — Other Ambulatory Visit: Payer: Self-pay | Admitting: Family Medicine

## 2020-01-14 ENCOUNTER — Encounter (INDEPENDENT_AMBULATORY_CARE_PROVIDER_SITE_OTHER): Payer: Self-pay | Admitting: Family Medicine

## 2020-01-14 ENCOUNTER — Ambulatory Visit (INDEPENDENT_AMBULATORY_CARE_PROVIDER_SITE_OTHER): Payer: BC Managed Care – PPO | Admitting: Family Medicine

## 2020-01-14 VITALS — BP 131/82 | HR 88 | Temp 98.6°F | Ht 69.0 in | Wt 314.0 lb

## 2020-01-14 DIAGNOSIS — F3289 Other specified depressive episodes: Secondary | ICD-10-CM | POA: Diagnosis not present

## 2020-01-14 DIAGNOSIS — Z9189 Other specified personal risk factors, not elsewhere classified: Secondary | ICD-10-CM | POA: Diagnosis not present

## 2020-01-14 DIAGNOSIS — I10 Essential (primary) hypertension: Secondary | ICD-10-CM | POA: Diagnosis not present

## 2020-01-14 DIAGNOSIS — Z6841 Body Mass Index (BMI) 40.0 and over, adult: Secondary | ICD-10-CM

## 2020-01-14 MED ORDER — BUPROPION HCL ER (SR) 150 MG PO TB12: 150.0000 mg | ORAL_TABLET | Freq: Every morning | ORAL | 0 refills | Status: DC

## 2020-01-14 MED ORDER — METOPROLOL SUCCINATE ER 50 MG PO TB24: 50.0000 mg | ORAL_TABLET | Freq: Every day | ORAL | 0 refills | Status: DC

## 2020-01-18 ENCOUNTER — Encounter (INDEPENDENT_AMBULATORY_CARE_PROVIDER_SITE_OTHER): Payer: Self-pay | Admitting: Family Medicine

## 2020-01-18 NOTE — Progress Notes (Signed)
Chief Complaint:   OBESITY Brandi Lester is here to discuss her progress with her obesity treatment plan along with follow-up of her obesity related diagnoses. Brandi Lester is on the Category 4 Plan and states she is following her eating plan approximately 85% of the time. Brandi Lester states she is walking for 30-45 minutes 3 times per week.  Today's visit was #: 31 Starting weight: 325 lbs Starting date: 05/23/2017 Today's weight: 314 lbs Today's date: 01/14/2020 Total lbs lost to date: 11 Total lbs lost since last in-office visit: 0  Interim History: Brandi Lester like the Category 4 plan and she has adhered to it very well. She is working on Designer, fashion/clothing. She is keeping extra calories to <300. She has gotten lunch more consistently at school. She would like to get back to the gym [employer]) this Summer.  Subjective:   1. Essential hypertension Tira's home blood pressure systolic range in [id number]. She also notes benefit of metoprolol for decreased palpitation at night. Her blood pressure is good today but she was elevated at her last office visit. BP Readings from Last 3 Encounters:  01/14/20 131/82  12/23/19 (!) 150/82  12/11/19 140/88    2. Other depression, with emotional eating Ashlee notes her symptoms are well controlled with bupropion.  3. At risk for heart disease Brandi Lester is at a higher than average risk for cardiovascular disease due to obesity.   Assessment/Plan:   1. Essential hypertension [name] is working on healthy weight loss and exercise to improve blood pressure control. We will watch for signs of hypotension as she continues her lifestyle modifications. [name] agreed to increase metoprolol succinate to 50 mg q daily with no refills.  - metoprolol succinate (TOPROL-XL) 50 MG 24 hr tablet; Take 1 tablet (50 mg total) by mouth daily. Take with or immediately following a meal.  Dispense: 30 tablet; Refill: 0  2. Other depression, with emotional eating Behavior  modification techniques were discussed today to help Brandi Lester deal with her emotional/non-hunger eating behaviors. We will refill bupropion for 1 month. Orders and follow up as documented in patient record.   - buPROPion (WELLBUTRIN SR) 150 MG 12 hr tablet; Take 1 tablet (150 mg total) by mouth in the morning.  Dispense: 30 tablet; Refill: 0  3. At risk for heart disease Brandi Lester was given approximately 15 minutes of coronary artery disease prevention counseling today. She is 54 y.o. female and has risk factors for heart disease including obesity. We discussed intensive lifestyle modifications today with an emphasis on specific weight loss instructions and strategies.   Repetitive spaced learning was employed today to elicit superior memory formation and behavioral change.  4. Class 3 severe obesity with serious comorbidity and body mass index (BMI) of 45.0 to 49.9 in adult, unspecified obesity type (HCC) Brandi Lester is currently in the action stage of change. As such, her goal is to continue with weight loss efforts. She has agreed to the Category 4 Plan.   Exercise goals: As is.  Behavioral modification strategies: increasing lean protein intake, decreasing simple carbohydrates and no skipping meals.  Brandi Lester has agreed to follow-up with our clinic in 4 weeks. She was informed of the importance of frequent follow-up visits to maximize her success with intensive lifestyle modifications for her multiple health conditions.   Objective:   Blood pressure 131/82, pulse 88, temperature 98.6 F (37 C), temperature source Oral, height 5\' 9"  (1.753 m), weight (!) 314 lb (142.4 kg), SpO2 99 %. Body mass index is 46.37  kg/m.  General: Cooperative, alert, well developed, in no acute distress. HEENT: Conjunctivae and lids unremarkable. Cardiovascular: Regular rhythm.  Lungs: Normal work of breathing. Neurologic: No focal deficits.   Lab Results  Component Value Date   CREATININE 0.80 12/01/2019   BUN  11 12/01/2019   NA 141 12/01/2019   K 4.1 12/01/2019   CL 103 12/01/2019   CO2 22 12/01/2019   Lab Results  Component Value Date   ALT 11 12/01/2019   AST 11 12/01/2019   ALKPHOS 86 12/01/2019   BILITOT 0.3 12/01/2019   Lab Results  Component Value Date   HGBA1C 5.9 (H) 12/01/2019   HGBA1C 5.9 (H) 07/07/2019   HGBA1C 6.1 (H) 11/20/2018   HGBA1C 5.9 (H) 07/14/2018   HGBA1C 5.9 03/13/2018   Lab Results  Component Value Date   INSULIN 20.7 12/01/2019   INSULIN 12.9 07/07/2019   INSULIN 21.2 11/20/2018   INSULIN 15.4 07/14/2018   INSULIN 12.9 09/12/2017   Lab Results  Component Value Date   TSH 1.410 07/07/2019   Lab Results  Component Value Date   CHOL 129 07/07/2019   HDL 33 (L) 07/07/2019   LDLCALC 76 07/07/2019   TRIG 106 07/07/2019   CHOLHDL 3 03/13/2018   Lab Results  Component Value Date   WBC 7.6 07/07/2019   HGB 12.3 07/07/2019   HCT 37.3 07/07/2019   MCV 84 07/07/2019   PLT 304 07/07/2019   No results found for: IRON, TIBC, FERRITIN  Attestation Statements:   Reviewed by clinician on day of visit: allergies, medications, problem list, medical history, surgical history, family history, social history, and previous encounter notes.   Wilhemena Durie, am acting as Location manager for [employer], FNP-C.  I have reviewed the above documentation for accuracy and completeness, and I agree with the above. -  Georgianne Fick, FNP

## 2020-01-26 MED FILL — MEDROXYPROGESTERONE 10 MG T: 10 | 90 days supply | Qty: 90 | Fill #1

## 2020-02-03 ENCOUNTER — Telehealth: Payer: BC Managed Care – PPO | Admitting: Physician Assistant

## 2020-02-03 DIAGNOSIS — J019 Acute sinusitis, unspecified: Secondary | ICD-10-CM

## 2020-02-03 MED ORDER — DOXYCYCLINE HYCLATE 100 MG PO CAPS
100.0000 mg | ORAL_CAPSULE | Freq: Two times a day (BID) | ORAL | 0 refills | Status: DC
Start: 2020-02-03 — End: 2020-02-16

## 2020-02-03 MED FILL — DOXYCYCLINE HYCLATE 100 MG: 100 | 10 days supply | Qty: 20 | Fill #0

## 2020-02-03 NOTE — Progress Notes (Signed)
We are sorry that you are not feeling well.  Here is how we plan to help!  Based on what you have shared with me it looks like you have sinusitis.  Sinusitis is inflammation and infection in the sinus cavities of the head.  Based on your presentation I believe you most likely have Acute Bacterial Sinusitis.  This is an infection caused by bacteria and is treated with antibiotics. I have prescribed Doxycycline 100mg by mouth twice a day for 10 days. You may use an oral decongestant such as Mucinex D or if you have glaucoma or high blood pressure use plain Mucinex. Saline nasal spray help and can safely be used as often as needed for congestion.  If you develop worsening sinus pain, fever or notice severe headache and vision changes, or if symptoms are not better after completion of antibiotic, please schedule an appointment with a health care provider.    Sinus infections are not as easily transmitted as other respiratory infection, however we still recommend that you avoid close contact with loved ones, especially the very young and elderly.  Remember to wash your hands thoroughly throughout the day as this is the number one way to prevent the spread of infection!  Home Care:  Only take medications as instructed by your medical team.  Complete the entire course of an antibiotic.  Do not take these medications with alcohol.  A steam or ultrasonic humidifier can help congestion.  You can place a towel over your head and breathe in the steam from hot water coming from a faucet.  Avoid close contacts especially the very young and the elderly.  Cover your mouth when you cough or sneeze.  Always remember to wash your hands.  Get Help Right Away If:  You develop worsening fever or sinus pain.  You develop a severe head ache or visual changes.  Your symptoms persist after you have completed your treatment plan.  Make sure you  Understand these instructions.  Will watch your  condition.  Will get help right away if you are not doing well or get worse.  Your e-visit answers were reviewed by a board certified advanced clinical practitioner to complete your personal care plan.  Depending on the condition, your plan could have included both over the counter or prescription medications.  If there is a problem please reply  once you have received a response from your provider.  Your safety is important to us.  If you have drug allergies check your prescription carefully.    You can use MyChart to ask questions about today's visit, request a non-urgent call back, or ask for a work or school excuse for 24 hours related to this e-Visit. If it has been greater than 24 hours you will need to follow up with your provider, or enter a new e-Visit to address those concerns.  You will get an e-mail in the next two days asking about your experience.  I hope that your e-visit has been valuable and will speed your recovery. Thank you for using e-visits.  Greater than 5 minutes, yet less than 10 minutes of time have been spent researching, coordinating, and implementing care for this patient today  

## 2020-02-11 ENCOUNTER — Ambulatory Visit (INDEPENDENT_AMBULATORY_CARE_PROVIDER_SITE_OTHER): Payer: BC Managed Care – PPO | Admitting: Family Medicine

## 2020-02-16 ENCOUNTER — Encounter (INDEPENDENT_AMBULATORY_CARE_PROVIDER_SITE_OTHER): Payer: Self-pay | Admitting: Family Medicine

## 2020-02-16 ENCOUNTER — Ambulatory Visit (INDEPENDENT_AMBULATORY_CARE_PROVIDER_SITE_OTHER): Payer: BC Managed Care – PPO | Admitting: Family Medicine

## 2020-02-16 ENCOUNTER — Other Ambulatory Visit: Payer: Self-pay

## 2020-02-16 VITALS — BP 127/73 | HR 85 | Temp 98.4°F | Ht 69.0 in | Wt 311.0 lb

## 2020-02-16 DIAGNOSIS — F3289 Other specified depressive episodes: Secondary | ICD-10-CM | POA: Diagnosis not present

## 2020-02-16 DIAGNOSIS — Z6841 Body Mass Index (BMI) 40.0 and over, adult: Secondary | ICD-10-CM

## 2020-02-16 DIAGNOSIS — E1159 Type 2 diabetes mellitus with other circulatory complications: Secondary | ICD-10-CM

## 2020-02-16 DIAGNOSIS — E1169 Type 2 diabetes mellitus with other specified complication: Secondary | ICD-10-CM | POA: Diagnosis not present

## 2020-02-16 DIAGNOSIS — Z9189 Other specified personal risk factors, not elsewhere classified: Secondary | ICD-10-CM

## 2020-02-16 DIAGNOSIS — I10 Essential (primary) hypertension: Secondary | ICD-10-CM

## 2020-02-16 MED ORDER — METFORMIN HCL 500 MG PO TABS: 500.0000 mg | ORAL_TABLET | Freq: Two times a day (BID) | ORAL | 0 refills | Status: DC

## 2020-02-16 MED ORDER — BUPROPION HCL ER (SR) 150 MG PO TB12: 150.0000 mg | ORAL_TABLET | Freq: Every morning | ORAL | 0 refills | Status: DC

## 2020-02-16 MED ORDER — METOPROLOL SUCCINATE ER 50 MG PO TB24: 50.0000 mg | ORAL_TABLET | Freq: Every day | ORAL | 0 refills | Status: DC

## 2020-02-16 MED FILL — BUPROPION HCL ER (SR) 150 M: 150 | 30 days supply | Qty: 30 | Fill #0

## 2020-02-16 MED FILL — METOPROLOL SUCCINATE ER 50: 50 | 90 days supply | Qty: 90 | Fill #0

## 2020-02-16 MED FILL — METFORMIN HCL 500 MG TABS: 500 | 30 days supply | Qty: 60 | Fill #0

## 2020-02-17 ENCOUNTER — Encounter (INDEPENDENT_AMBULATORY_CARE_PROVIDER_SITE_OTHER): Payer: Self-pay | Admitting: Family Medicine

## 2020-02-17 ENCOUNTER — Other Ambulatory Visit: Payer: Self-pay | Admitting: *Deleted

## 2020-02-17 MED ORDER — ATORVASTATIN CALCIUM 10 MG PO TABS: ORAL_TABLET | ORAL | 1 refills | Status: DC

## 2020-02-17 MED FILL — ATORVASTATIN CALCIUM 10 MG: 10 | 90 days supply | Qty: 45 | Fill #0

## 2020-02-17 NOTE — Progress Notes (Signed)
Chief Complaint:   OBESITY Brandi Lester is here to discuss her progress with her obesity treatment plan along with follow-up of her obesity related diagnoses. Brandi Lester is on the Category 4 Plan and states she is following her eating plan approximately 50% of the time. Brandi Lester states she is walking for 45-60 minutes 4 times per week.  Today's visit was #: 27 Starting weight: 325 lbs Starting date: 05/23/2017 Today's weight: 311 lbs Today's date: 02/16/2020 Total lbs lost to date: 14 Total lbs lost since last in-office visit: 3  Interim History: Brandi Lester is not getting her protein in all the time, but she is working on this. She did eat a whole bag of chips recently.  Subjective:   1. Type 2 diabetes mellitus without complication, without long-term current use of insulin (HCC) Brandi Lester's diabetes mellitus is well controlled with metformin.  Lab Results  Component Value Date   HGBA1C 5.9 (H) 12/01/2019   HGBA1C 5.9 (H) 07/07/2019   HGBA1C 6.1 (H) 11/20/2018   Lab Results  Component Value Date   MICROALBUR 0.50 12/15/2012   LDLCALC 76 07/07/2019   CREATININE 0.80 12/01/2019   Lab Results  Component Value Date   INSULIN 20.7 12/01/2019   INSULIN 12.9 07/07/2019   INSULIN 21.2 11/20/2018   INSULIN 15.4 07/14/2018   INSULIN 12.9 09/12/2017   2. Essential hypertension Brandi Lester's blood pressure is well controlled. Cardiovascular ROS: no chest pain or dyspnea on exertion.  BP Readings from Last 3 Encounters:  02/16/20 127/73  01/14/20 131/82  12/23/19 (!) 150/82   Lab Results  Component Value Date   CREATININE 0.80 12/01/2019   CREATININE 0.60 07/07/2019   CREATININE 0.73 11/20/2018   3. Other depression, with emotional eating Brandi Lester's symptoms are well controlled overall.  4. At risk for heart disease Brandi Lester is at a higher than average risk for cardiovascular disease due to obesity, HTN, and low HDL .   Assessment/Plan:   1. Type 2 diabetes mellitus without  complication, without long-term current use of insulin (HCC) Good blood sugar control is important to decrease the likelihood of diabetic complications such as nephropathy, neuropathy, limb loss, blindness, coronary artery disease, and death. Intensive lifestyle modification including diet, exercise and weight loss are the first line of treatment for diabetes. We will refill metformin for 1 month.  - metFORMIN (GLUCOPHAGE) 500 MG tablet; Take 1 tablet (500 mg total) by mouth 2 (two) times daily with a meal.  Dispense: 60 tablet; Refill: 0  2. Essential hypertension Brandi Lester is working on healthy weight loss and exercise to improve blood pressure control. We will watch for signs of hypotension as she continues her lifestyle modifications. We will refill metoprolol XL for 90 days with no refills.  - metoprolol succinate (TOPROL-XL) 50 MG 24 hr tablet; Take 1 tablet (50 mg total) by mouth daily. Take with or immediately following a meal.  Dispense: 90 tablet; Refill: 0  3. Other depression, with emotional eating Behavior modification techniques were discussed today to help Brandi Lester deal with her emotional/non-hunger eating behaviors. We will refill bupropion SR for 1 month. Orders and follow up as documented in patient record.   - buPROPion (WELLBUTRIN SR) 150 MG 12 hr tablet; Take 1 tablet (150 mg total) by mouth in the morning.  Dispense: 30 tablet; Refill: 0  4. At risk for heart disease Brandi Lester was given approximately 15 minutes of coronary artery disease prevention counseling today. She is 54 y.o. female and has risk factors for heart disease including  obesity. We discussed intensive lifestyle modifications today with an emphasis on specific weight loss instructions and strategies.   Repetitive spaced learning was employed today to elicit superior memory formation and behavioral change.  5. Class 3 severe obesity with serious comorbidity and body mass index (BMI) of 45.0 to 49.9 in adult,  unspecified obesity type (HCC) Brandi Lester is currently in the action stage of change. As such, her goal is to continue with weight loss efforts. She has agreed to the Category 4 Plan.   Exercise goals: As is.  Behavioral modification strategies: increasing lean protein intake and decreasing simple carbohydrates. Keep foods that trigger snacking out of the house. Shop for food when full.   Brandi Lester has agreed to follow-up with our clinic in 2 to 3 weeks. She was informed of the importance of frequent follow-up visits to maximize her success with intensive lifestyle modifications for her multiple health conditions.   Objective:   Blood pressure 127/73, pulse 85, temperature 98.4 F (36.9 C), temperature source Oral, height 5\' 9"  (1.753 m), weight (!) 311 lb (141.1 kg), SpO2 100 %. Body mass index is 45.93 kg/m.  General: Cooperative, alert, well developed, in no acute distress. HEENT: Conjunctivae and lids unremarkable. Cardiovascular: Regular rhythm.  Lungs: Normal work of breathing. Neurologic: No focal deficits.   Lab Results  Component Value Date   CREATININE 0.80 12/01/2019   BUN 11 12/01/2019   NA 141 12/01/2019   K 4.1 12/01/2019   CL 103 12/01/2019   CO2 22 12/01/2019   Lab Results  Component Value Date   ALT 11 12/01/2019   AST 11 12/01/2019   ALKPHOS 86 12/01/2019   BILITOT 0.3 12/01/2019   Lab Results  Component Value Date   HGBA1C 5.9 (H) 12/01/2019   HGBA1C 5.9 (H) 07/07/2019   HGBA1C 6.1 (H) 11/20/2018   HGBA1C 5.9 (H) 07/14/2018   HGBA1C 5.9 03/13/2018   Lab Results  Component Value Date   INSULIN 20.7 12/01/2019   INSULIN 12.9 07/07/2019   INSULIN 21.2 11/20/2018   INSULIN 15.4 07/14/2018   INSULIN 12.9 09/12/2017   Lab Results  Component Value Date   TSH 1.410 07/07/2019   Lab Results  Component Value Date   CHOL 129 07/07/2019   HDL 33 (L) 07/07/2019   LDLCALC 76 07/07/2019   TRIG 106 07/07/2019   CHOLHDL 3 03/13/2018   Lab Results    Component Value Date   WBC 7.6 07/07/2019   HGB 12.3 07/07/2019   HCT 37.3 07/07/2019   MCV 84 07/07/2019   PLT 304 07/07/2019   No results found for: IRON, TIBC, FERRITIN  Attestation Statements:   Reviewed by clinician on day of visit: allergies, medications, problem list, medical history, surgical history, family history, social history, and previous encounter notes.   Wilhemena Durie, am acting as Location manager for [employer], FNP-C.  I have reviewed the above documentation for accuracy and completeness, and I agree with the above. -  Georgianne Fick, FNP

## 2020-02-22 ENCOUNTER — Other Ambulatory Visit: Payer: Self-pay | Admitting: Family Medicine

## 2020-02-22 ENCOUNTER — Other Ambulatory Visit: Payer: Self-pay

## 2020-02-22 ENCOUNTER — Other Ambulatory Visit (HOSPITAL_COMMUNITY)
Admission: RE | Admit: 2020-02-22 | Discharge: 2020-02-22 | Disposition: A | Discharge status: Home / Self Care | Payer: BC Managed Care – PPO | Source: Ambulatory Visit | Attending: Family Medicine | Admitting: Family Medicine

## 2020-02-22 ENCOUNTER — Encounter: Payer: Self-pay | Admitting: Family Medicine

## 2020-02-22 ENCOUNTER — Ambulatory Visit (INDEPENDENT_AMBULATORY_CARE_PROVIDER_SITE_OTHER): Payer: BC Managed Care – PPO | Admitting: Family Medicine

## 2020-02-22 VITALS — BP 138/63 | HR 88 | Ht 70.0 in | Wt 318.0 lb

## 2020-02-22 DIAGNOSIS — Z1211 Encounter for screening for malignant neoplasm of colon: Secondary | ICD-10-CM | POA: Diagnosis not present

## 2020-02-22 DIAGNOSIS — Z01419 Encounter for gynecological examination (general) (routine) without abnormal findings: Secondary | ICD-10-CM | POA: Insufficient documentation

## 2020-02-22 DIAGNOSIS — N951 Menopausal and female climacteric states: Secondary | ICD-10-CM

## 2020-02-22 MED ORDER — MEDROXYPROGESTERONE ACETATE 10 MG PO TABS: 10.0000 mg | ORAL_TABLET | Freq: Every day | ORAL | 3 refills | Status: DC

## 2020-02-22 NOTE — Patient Instructions (Signed)

## 2020-02-22 NOTE — Progress Notes (Signed)
GYNECOLOGY ANNUAL PREVENTATIVE CARE ENCOUNTER NOTE  Subjective:   Brandi Lester is a 54 y.o. G0P0000 female here for a routine annual gynecologic exam.  Current complaints: none. Has history of fibroids and AUB. On provera 10mg  daily, which helps with the menorrhagia. Still has period, but not as heavy and no cramping. Did have EmBx with normal path.   Denies abnormal vaginal bleeding, discharge, pelvic pain, problems with intercourse or other gynecologic concerns.    Has some night sweats. No hot flashes. Uses soy shake before bed, which helps with symptoms.  Gynecologic History Patient's last menstrual period was 02/05/2020 (exact date). Contraception: oral progesterone-only contraceptive Last Pap: 3 years ago. Results were: normal Last mammogram: 6 months ago. Results were: normal  Obstetric History OB History  Gravida Para Term Preterm AB Living  0 0 0 0 0 0  SAB TAB Ectopic Multiple Live Births  0 0 0 0 0    Past Medical History:  Diagnosis Date  . ADD (attention deficit disorder)   . Allergy    allergic rhinitis  . Anemia 07/25/2013  . Angio-edema   . Back pain   . Costochondritis 02/20/2015  . Diabetes mellitus    Type II   previously 3 years ago - no longer on meds   . Dyslipidemia 07/25/2013  . Fungus infection 10/13   Left great toe  . GERD (gastroesophageal reflux disease)   . Hypertension   . IBS (irritable bowel syndrome)   . Ingrown nail 10/13   right foot next to the last toe  . Insomnia 04/20/2013  . Joint pain   . Lactose intolerance   . Leg edema   . Obesity   . Pedal edema 02/20/2015  . Prediabetes   . Preventative health care 09/25/2015  . PVC (premature ventricular contraction)     Past Surgical History:  Procedure Laterality Date  . HYSTEROSCOPY N/A 10/04/2015   Procedure: HYSTEROSCOPY with Removal IUD;  Surgeon: Vanessa Kick, MD;  Location: [facility];  Service: Gynecology;  Laterality: N/A;  . lasik     eye surgery  . WISDOM TOOTH  EXTRACTION      Current Outpatient Medications on File Prior to Visit  Medication Sig Dispense Refill  . acetaminophen (TYLENOL 8 HOUR) 650 MG CR tablet Take 1 tablet (650 mg total) every 8 (eight) hours as needed by mouth for pain.    [name] amLODipine (NORVASC) 10 MG tablet Take 1 tablet (10 mg total) by mouth daily. 30 tablet 0  . atorvastatin (LIPITOR) 10 MG tablet TAKE 1/2 TABLET (5 MG TOTAL) BY MOUTH DAILY. 45 tablet 1  . azelastine (ASTELIN) 0.1 % nasal spray Place 2 sprays into both nostrils 2 (two) times daily. Use in each nostril as directed 90 mL 1  . buPROPion (WELLBUTRIN SR) 150 MG 12 hr tablet Take 1 tablet (150 mg total) by mouth in the morning. 30 tablet 0  . cholecalciferol (VITAMIN D3) 25 MCG (1000 UT) tablet Take 4,000 Units by mouth daily.    . cyclobenzaprine (FLEXERIL) 5 MG tablet Take 1 tablet (5 mg total) by mouth 3 (three) times daily as needed for muscle spasms. 30 tablet 1  . EPINEPHrine (EPIPEN 2-PAK) 0.3 mg/0.3 mL IJ SOAJ injection Use as directed for severe allergic reaction 2 each 1  . fexofenadine (ALLEGRA ALLERGY) 180 MG tablet Take 1 tablet (180 mg total) by mouth daily. 301 tablet 1  . fluticasone (FLONASE) 50 MCG/ACT nasal spray Place 2 sprays into both  nostrils daily as needed for allergies. 48 g 1  . hydrochlorothiazide (HYDRODIURIL) 25 MG tablet Take 1 tablet (25 mg total) by mouth daily. 30 tablet 0  . ibuprofen (ADVIL,MOTRIN) 600 MG tablet Take 1 tablet (600 mg total) by mouth every 6 (six) hours as needed. 90 tablet 0  . losartan (COZAAR) 100 MG tablet Take 1 tablet (100 mg total) by mouth at bedtime. 30 tablet 0  . metFORMIN (GLUCOPHAGE) 500 MG tablet Take 1 tablet (500 mg total) by mouth 2 (two) times daily with a meal. 60 tablet 0  . metoprolol succinate (TOPROL-XL) 50 MG 24 hr tablet Take 1 tablet (50 mg total) by mouth daily. Take with or immediately following a meal. 90 tablet 0  . montelukast (SINGULAIR) 10 MG tablet Take 1 tablet (10 mg total) by mouth  at bedtime as needed. 90 tablet 1  . Omega-3 Fatty Acids (FISH OIL PO) Take 1 capsule by mouth daily.    . pantoprazole (PROTONIX) 40 MG tablet TAKE 1 TABLET BY MOUTH ONCE DAILY 90 tablet 1  . potassium chloride SA (KLOR-CON) 20 MEQ tablet TAKE 1 TABLET BY MOUTH THREE TIMES DAILY 270 tablet 1  . Probiotic Product (PROBIOTIC-10 PO) Take by mouth daily.    . TURMERIC PO Take 1 capsule by mouth daily.     No current facility-administered medications on file prior to visit.    Allergies  Allergen Reactions  . Penicillins Itching and Swelling    Has patient had a PCN reaction causing immediate rash, facial/tongue/throat swelling, SOB or lightheadedness with hypotension: yes Has patient had a PCN reaction causing severe rash involving mucus membranes or skin necrosis: no Has patient had a PCN reaction that required hospitalization no Has patient had a PCN reaction occurring within the last 10 years: yes If all of the above answers are "NO", then may proceed with Cephalosporin use.   Brandi Lester [Liraglutide]     Vomiting    Social History   Socioeconomic History  . Marital status: Single    Spouse name: Not on file  . Number of children: Not on file  . Years of education: Not on file  . Highest education level: Not on file  Occupational History  . Occupation: Pharmacist, hospital, [employer]  Tobacco Use  . Smoking status: Former Smoker    Packs/day: 1.00    Years: 15.00    Pack years: 15.00    Quit date: 09/03/2001    Years since quitting: 18.4  . Smokeless tobacco: Never Used  Vaping Use  . Vaping Use: Never used  Substance and Sexual Activity  . Alcohol use: Yes    Comment: rare  . Drug use: No  . Sexual activity: Yes    Birth control/protection: None  Other Topics Concern  . Not on file  Social History Narrative  . Not on file   Social Determinants of Health   Financial Resource Strain:   . Difficulty of Paying Living Expenses:   Food Insecurity:   . Worried About Paediatric nurse in the Last Year:   . Arboriculturist in the Last Year:   Transportation Needs:   . Film/video editor (Medical):   [name] Lack of Transportation (Non-Medical):   Physical Activity:   . Days of Exercise per Week:   . Minutes of Exercise per Session:   Stress:   . Feeling of Stress :   Social Connections:   . Frequency of Communication with Friends and Family:   .  Frequency of Social Gatherings with Friends and Family:   . Attends Religious Services:   . Active Member of Clubs or Organizations:   . Attends Archivist Meetings:   [name] Marital Status:   Intimate Partner Violence:   . Fear of Current or Ex-Partner:   . Emotionally Abused:   [name] Physically Abused:   . Sexually Abused:     Family History  Problem Relation Age of Onset  . Heart attack Mother 23  . Hypertension Mother   . Heart disease Mother   . Obesity Mother   . Cancer Father        prostate  . Heart attack Sister 27  . Hypertension Other     The following portions of the patient's history were reviewed and updated as appropriate: allergies, current medications, past family history, past medical history, past social history, past surgical history and problem list.  Review of Systems Pertinent items are noted in HPI.   Objective:  BP 138/63   Pulse 88   Ht 5\' 10"  (1.778 m)   Wt (!) 318 lb (144.2 kg)   LMP 02/05/2020 (Exact Date)   BMI 45.63 kg/m  Wt Readings from Last 3 Encounters:  02/22/20 (!) 318 lb (144.2 kg)  02/16/20 (!) 311 lb (141.1 kg)  01/14/20 (!) 314 lb (142.4 kg)     Chaperone present during exam  CONSTITUTIONAL: Well-developed, well-nourished female in no acute distress.  HENT:  Normocephalic, atraumatic, External right and left ear normal. Oropharynx is clear and moist EYES: Conjunctivae and EOM are normal. Pupils are equal, round, and reactive to light. No scleral icterus.  NECK: Normal range of motion, supple, no masses.  Normal thyroid.   CARDIOVASCULAR: Normal  heart rate noted, regular rhythm RESPIRATORY: Clear to auscultation bilaterally. Effort and breath sounds normal, no problems with respiration noted. BREASTS: Symmetric in size. No masses, skin changes, nipple drainage, or lymphadenopathy. ABDOMEN: Soft, normal bowel sounds, no distention noted.  No tenderness, rebound or guarding.  PELVIC: Normal appearing external genitalia; normal appearing vaginal mucosa and cervix.  No abnormal discharge noted.  Normal uterine size, no other palpable masses, no uterine or adnexal tenderness. MUSCULOSKELETAL: Normal range of motion. No tenderness.  No cyanosis, clubbing, or edema.  2+ distal pulses. SKIN: Skin is warm and dry. No rash noted. Not diaphoretic. No erythema. No pallor. NEUROLOGIC: Alert and oriented to person, place, and time. Normal reflexes, muscle tone coordination. No cranial nerve deficit noted. PSYCHIATRIC: Normal mood and affect. Normal behavior. Normal judgment and thought content.  Assessment:  Annual gynecologic examination with pap smear   Plan:  1. Well Woman Exam Will follow up results of pap smear and manage accordingly. Mammogram scheduled - Cytology - PAP( [place])  2. Colon cancer screening - Ambulatory referral to Gastroenterology   Routine preventative health maintenance measures emphasized. Please refer to After Visit Summary for other counseling recommendations.    Loma Boston, [place] for [employer]

## 2020-02-23 LAB — CYTOLOGY - PAP
Comment: NEGATIVE
Diagnosis: NEGATIVE
High risk HPV: NEGATIVE

## 2020-03-07 ENCOUNTER — Other Ambulatory Visit: Payer: Self-pay | Admitting: Family Medicine

## 2020-03-07 DIAGNOSIS — E1159 Type 2 diabetes mellitus with other circulatory complications: Secondary | ICD-10-CM

## 2020-03-07 MED FILL — MONTELUKAST SOD 10 MG TAB: 10 | 90 days supply | Qty: 90 | Fill #0

## 2020-03-08 MED FILL — AMLODIPINE BESYLATE 10 MG T: 10 | 90 days supply | Qty: 90 | Fill #0

## 2020-03-08 MED FILL — HYDROCHLOROTHIAZIDE 25 MG T: 25 | 90 days supply | Qty: 90 | Fill #0

## 2020-03-08 MED FILL — LOSARTAN POTASSIUM 100 MG T: 100 | 90 days supply | Qty: 90 | Fill #0

## 2020-03-09 ENCOUNTER — Encounter (INDEPENDENT_AMBULATORY_CARE_PROVIDER_SITE_OTHER): Payer: Self-pay | Admitting: Family Medicine

## 2020-03-09 ENCOUNTER — Ambulatory Visit (INDEPENDENT_AMBULATORY_CARE_PROVIDER_SITE_OTHER): Payer: BC Managed Care – PPO | Admitting: Family Medicine

## 2020-03-09 ENCOUNTER — Other Ambulatory Visit: Payer: Self-pay

## 2020-03-09 VITALS — BP 143/84 | HR 94 | Temp 99.6°F | Ht 70.0 in | Wt 315.0 lb

## 2020-03-09 DIAGNOSIS — I10 Essential (primary) hypertension: Secondary | ICD-10-CM

## 2020-03-09 DIAGNOSIS — Z9189 Other specified personal risk factors, not elsewhere classified: Secondary | ICD-10-CM

## 2020-03-09 DIAGNOSIS — E1169 Type 2 diabetes mellitus with other specified complication: Secondary | ICD-10-CM

## 2020-03-09 DIAGNOSIS — Z6841 Body Mass Index (BMI) 40.0 and over, adult: Secondary | ICD-10-CM

## 2020-03-09 MED ORDER — METFORMIN HCL 500 MG PO TABS: 500.0000 mg | ORAL_TABLET | Freq: Two times a day (BID) | ORAL | 0 refills | Status: DC

## 2020-03-10 MED FILL — METFORMIN HCL 500 MG TABS: 500 | 90 days supply | Qty: 180 | Fill #0

## 2020-03-14 ENCOUNTER — Encounter (INDEPENDENT_AMBULATORY_CARE_PROVIDER_SITE_OTHER): Payer: Self-pay | Admitting: Family Medicine

## 2020-03-14 NOTE — Progress Notes (Signed)
Chief Complaint:   OBESITY Brandi Lester is here to discuss her progress with her obesity treatment plan along with follow-up of her obesity related diagnoses. Brandi Lester is on the Category 4 Plan and states she is following her eating plan approximately 50% of the time. Brandi Lester states she is walking and riding stationary bike for 45 minutes 4 times per week.  Today's visit was #: 58 Starting weight: 325 lbs Starting date: 05/23/2017 Today's weight: 315 lbs Today's date: 03/09/2020 Total lbs lost to date: 10 Total lbs lost since last in-office visit: 0  Interim History: Brandi Lester notes being off the plan on the 4th of July. She tends to eat more at breakfast and lunch, and less at dinner. She notes that she is not eating enough protein.   Subjective:   1. Essential hypertension Candita's blood pressure is elevated today. She has been off her medications because she ran out, but she has now picked it up. Cardiovascular ROS: no chest pain or dyspnea on exertion.  BP Readings from Last 3 Encounters:  03/09/20 (!) 143/84  02/22/20 138/63  02/16/20 127/73   Lab Results  Component Value Date   CREATININE 0.80 12/01/2019   CREATININE 0.60 07/07/2019   CREATININE 0.73 11/20/2018   2. Type 2 diabetes mellitus without complication, without long-term current use of insulin ([place]) Hellon denies hunger. Her diabetes mellitus is well controlled on metformin.  Lab Results  Component Value Date   HGBA1C 5.9 (H) 12/01/2019   HGBA1C 5.9 (H) 07/07/2019   HGBA1C 6.1 (H) 11/20/2018   Lab Results  Component Value Date   MICROALBUR 0.50 12/15/2012   LDLCALC 76 07/07/2019   CREATININE 0.80 12/01/2019   Lab Results  Component Value Date   INSULIN 20.7 12/01/2019   INSULIN 12.9 07/07/2019   INSULIN 21.2 11/20/2018   INSULIN 15.4 07/14/2018   INSULIN 12.9 09/12/2017   3. At risk for malnutrition Brandi Lester is at increased risk for malnutrition due to inadequate protein intake.  Assessment/Plan:     1. Essential hypertension Brandi Lester will continue all her medications, and will continue working on healthy weight loss and exercise to improve blood pressure control. We will watch for signs of hypotension as she continues her lifestyle modifications.  2. Type 2 diabetes mellitus without complication, without long-term current use of insulin (HCC) Good blood sugar control is important to decrease the likelihood of diabetic complications such as nephropathy, neuropathy, limb loss, blindness, coronary artery disease, and death. Intensive lifestyle modification including diet, exercise and weight loss are the first line of treatment for diabetes. Brandi Lester will continue her medications, and we will refill metformin for 90 days with no refill.  - metFORMIN (GLUCOPHAGE) 500 MG tablet; Take 1 tablet (500 mg total) by mouth 2 (two) times daily with a meal.  Dispense: 180 tablet; Refill: 0  3. At risk for malnutrition Brandi Lester was given approximately 15 minutes of counseling today regarding prevention of malnutrition and ways to meet macronutrient goals..   4. Class 3 severe obesity with serious comorbidity and body mass index (BMI) of 45.0 to 49.9 in adult, unspecified obesity type (HCC) Brandi Lester is currently in the action stage of change. As such, her goal is to continue with weight loss efforts. She has agreed to the Category 4 Plan.   Exercise goals: As is.  Behavioral modification strategies: increasing lean protein intake and decreasing simple carbohydrates.  Brandi Lester has agreed to follow-up with our clinic in 3 weeks. She was informed of the importance of  frequent follow-up visits to maximize her success with intensive lifestyle modifications for her multiple health conditions.   Objective:   Blood pressure (!) 143/84, pulse 94, temperature 99.6 F (37.6 C), temperature source Oral, height 5\' 10"  (1.778 m), weight (!) 315 lb (142.9 kg), SpO2 98 %. Body mass index is 45.2 kg/m.  General:  Cooperative, alert, well developed, in no acute distress. HEENT: Conjunctivae and lids unremarkable. Cardiovascular: Regular rhythm.  Lungs: Normal work of breathing. Neurologic: No focal deficits.   Lab Results  Component Value Date   CREATININE 0.80 12/01/2019   BUN 11 12/01/2019   NA 141 12/01/2019   K 4.1 12/01/2019   CL 103 12/01/2019   CO2 22 12/01/2019   Lab Results  Component Value Date   ALT 11 12/01/2019   AST 11 12/01/2019   ALKPHOS 86 12/01/2019   BILITOT 0.3 12/01/2019   Lab Results  Component Value Date   HGBA1C 5.9 (H) 12/01/2019   HGBA1C 5.9 (H) 07/07/2019   HGBA1C 6.1 (H) 11/20/2018   HGBA1C 5.9 (H) 07/14/2018   HGBA1C 5.9 03/13/2018   Lab Results  Component Value Date   INSULIN 20.7 12/01/2019   INSULIN 12.9 07/07/2019   INSULIN 21.2 11/20/2018   INSULIN 15.4 07/14/2018   INSULIN 12.9 09/12/2017   Lab Results  Component Value Date   TSH 1.410 07/07/2019   Lab Results  Component Value Date   CHOL 129 07/07/2019   HDL 33 (L) 07/07/2019   LDLCALC 76 07/07/2019   TRIG 106 07/07/2019   CHOLHDL 3 03/13/2018   Lab Results  Component Value Date   WBC 7.6 07/07/2019   HGB 12.3 07/07/2019   HCT 37.3 07/07/2019   MCV 84 07/07/2019   PLT 304 07/07/2019   No results found for: IRON, TIBC, FERRITIN  Attestation Statements:   Reviewed by clinician on day of visit: allergies, medications, problem list, medical history, surgical history, family history, social history, and previous encounter notes.   Wilhemena Durie, am acting as Location manager for [employer], FNP-C.  I have reviewed the above documentation for accuracy and completeness, and I agree with the above. -  Georgianne Fick, FNP

## 2020-03-17 ENCOUNTER — Other Ambulatory Visit: Payer: Self-pay

## 2020-03-17 ENCOUNTER — Other Ambulatory Visit: Payer: Self-pay | Admitting: Family Medicine

## 2020-03-17 ENCOUNTER — Encounter: Payer: Self-pay | Admitting: Internal Medicine

## 2020-03-17 ENCOUNTER — Telehealth (INDEPENDENT_AMBULATORY_CARE_PROVIDER_SITE_OTHER): Payer: BC Managed Care – PPO | Admitting: Family Medicine

## 2020-03-17 VITALS — BP 133/78 | HR 80

## 2020-03-17 DIAGNOSIS — E119 Type 2 diabetes mellitus without complications: Secondary | ICD-10-CM

## 2020-03-17 DIAGNOSIS — E785 Hyperlipidemia, unspecified: Secondary | ICD-10-CM

## 2020-03-17 DIAGNOSIS — I10 Essential (primary) hypertension: Secondary | ICD-10-CM | POA: Diagnosis not present

## 2020-03-17 DIAGNOSIS — E876 Hypokalemia: Secondary | ICD-10-CM | POA: Diagnosis not present

## 2020-03-17 DIAGNOSIS — J309 Allergic rhinitis, unspecified: Secondary | ICD-10-CM

## 2020-03-17 DIAGNOSIS — E559 Vitamin D deficiency, unspecified: Secondary | ICD-10-CM

## 2020-03-17 DIAGNOSIS — E781 Pure hyperglyceridemia: Secondary | ICD-10-CM

## 2020-03-17 DIAGNOSIS — E1169 Type 2 diabetes mellitus with other specified complication: Secondary | ICD-10-CM

## 2020-03-17 DIAGNOSIS — E669 Obesity, unspecified: Secondary | ICD-10-CM

## 2020-03-17 DIAGNOSIS — D649 Anemia, unspecified: Secondary | ICD-10-CM | POA: Diagnosis not present

## 2020-03-17 DIAGNOSIS — T7840XA Allergy, unspecified, initial encounter: Secondary | ICD-10-CM

## 2020-03-17 MED ORDER — FLUTICASONE PROPIONATE 50 MCG/ACT NA SUSP: 2.0000 | Freq: Every day | NASAL | 2 refills | Status: DC | PRN

## 2020-03-17 MED ORDER — POTASSIUM CHLORIDE CRYS ER 20 MEQ PO TBCR: 20.0000 meq | EXTENDED_RELEASE_TABLET | Freq: Three times a day (TID) | ORAL | 1 refills | Status: DC

## 2020-03-17 MED ORDER — AZELASTINE HCL 0.1 % NA SOLN: 2.0000 | Freq: Two times a day (BID) | NASAL | 2 refills | Status: DC

## 2020-03-17 MED FILL — POTASSIUM CHLORIDE CRYS ER: 20 | 90 days supply | Qty: 270 | Fill #0

## 2020-03-17 MED FILL — AZELASTINE HCL 137 MCG/SPRA: 137 | 25 days supply | Qty: 30 | Fill #0

## 2020-03-17 MED FILL — FLUTICASONE PROP 50 MCG SPR: 50 | 30 days supply | Qty: 16 | Fill #0

## 2020-03-17 NOTE — Patient Instructions (Signed)

## 2020-03-19 NOTE — Assessment & Plan Note (Signed)
Given rx on Astelin and Flonase.

## 2020-03-19 NOTE — Assessment & Plan Note (Signed)
Tolerating statin, encouraged heart healthy diet, avoid trans fats, minimize simple carbs and saturated fats. Increase exercise as tolerated 

## 2020-03-19 NOTE — Progress Notes (Signed)
Virtual Visit via Video Note  I connected with Brandi Lester on 03/19/20 at  3:00 PM EDT by a video enabled telemedicine application and verified that I am speaking with the correct person using two identifiers.  Location: Patient: home, patient and provider in visit Provider: office   I discussed the limitations of evaluation and management by telemedicine and the availability of in person appointments. The patient expressed understanding and agreed to proceed. Kem Boroughs, CMA was able to get the patient set up on a video visit.    Subjective:    Patient ID: Brandi Lester, female    DOB: September 28, 1965, 54 y.o.   MRN: [id number]  Chief Complaint  Patient presents with  . Follow-up    HPI Patient is in today for follow up on chronic medical concerns. No recent febrile illness or hospitalizations. No polyuria or polydipsia. She is anxious about the school year starting but also is ready. Is trying to eat well and stay active. Denies CP/palp/SOB/HA/congestion/fevers/GI or GU c/o. Taking meds as prescribed  Past Medical History:  Diagnosis Date  . ADD (attention deficit disorder)   . Allergy    allergic rhinitis  . Anemia 07/25/2013  . Angio-edema   . Back pain   . Costochondritis 02/20/2015  . Diabetes mellitus    Type II   previously 3 years ago - no longer on meds   . Dyslipidemia 07/25/2013  . Fungus infection 10/13   Left great toe  . GERD (gastroesophageal reflux disease)   . Hypertension   . IBS (irritable bowel syndrome)   . Ingrown nail 10/13   right foot next to the last toe  . Insomnia 04/20/2013  . Joint pain   . Lactose intolerance   . Leg edema   . Obesity   . Pedal edema 02/20/2015  . Prediabetes   . Preventative health care 09/25/2015  . PVC (premature ventricular contraction)     Past Surgical History:  Procedure Laterality Date  . HYSTEROSCOPY N/A 10/04/2015   Procedure: HYSTEROSCOPY with Removal IUD;  Surgeon: Vanessa Kick, MD;   Location: [facility];  Service: Gynecology;  Laterality: N/A;  . lasik     eye surgery  . WISDOM TOOTH EXTRACTION      Family History  Problem Relation Age of Onset  . Heart attack Mother 34  . Hypertension Mother   . Heart disease Mother   . Obesity Mother   . Cancer Father        prostate  . Heart attack Sister 54  . Hypertension Other     Social History   Socioeconomic History  . Marital status: Single    Spouse name: Not on file  . Number of children: Not on file  . Years of education: Not on file  . Highest education level: Not on file  Occupational History  . Occupation: Pharmacist, hospital, [employer] manager  Tobacco Use  . Smoking status: Former Smoker    Packs/day: 1.00    Years: 15.00    Pack years: 15.00    Quit date: 09/03/2001    Years since quitting: 18.5  . Smokeless tobacco: Never Used  Vaping Use  . Vaping Use: Never used  Substance and Sexual Activity  . Alcohol use: Yes    Comment: rare  . Drug use: No  . Sexual activity: Yes    Birth control/protection: None  Other Topics Concern  . Not on file  Social History Narrative  . Not on file   Social  Determinants of Health   Financial Resource Strain:   . Difficulty of Paying Living Expenses:   Food Insecurity:   . Worried About Charity fundraiser in the Last Year:   . Arboriculturist in the Last Year:   Transportation Needs:   . Film/video editor (Medical):   [name] Lack of Transportation (Non-Medical):   Physical Activity:   . Days of Exercise per Week:   . Minutes of Exercise per Session:   Stress:   . Feeling of Stress :   Social Connections:   . Frequency of Communication with Friends and Family:   . Frequency of Social Gatherings with Friends and Family:   . Attends Religious Services:   . Active Member of Clubs or Organizations:   . Attends Archivist Meetings:   [name] Marital Status:   Intimate Partner Violence:   . Fear of Current or Ex-Partner:   . Emotionally Abused:   [name] Physically  Abused:   . Sexually Abused:     Outpatient Medications Prior to Visit  Medication Sig Dispense Refill  . acetaminophen (TYLENOL 8 HOUR) 650 MG CR tablet Take 1 tablet (650 mg total) every 8 (eight) hours as needed by mouth for pain.    [name] amLODipine (NORVASC) 10 MG tablet Take 1 tablet (10 mg total) by mouth daily. 90 tablet 0  . atorvastatin (LIPITOR) 10 MG tablet TAKE 1/2 TABLET (5 MG TOTAL) BY MOUTH DAILY. 45 tablet 1  . buPROPion (WELLBUTRIN SR) 150 MG 12 hr tablet Take 1 tablet (150 mg total) by mouth in the morning. 30 tablet 0  . cholecalciferol (VITAMIN D3) 25 MCG (1000 UT) tablet Take 4,000 Units by mouth daily.    . cyclobenzaprine (FLEXERIL) 5 MG tablet Take 1 tablet (5 mg total) by mouth 3 (three) times daily as needed for muscle spasms. 30 tablet 1  . EPINEPHrine (EPIPEN 2-PAK) 0.3 mg/0.3 mL IJ SOAJ injection Use as directed for severe allergic reaction 2 each 1  . fexofenadine (ALLEGRA ALLERGY) 180 MG tablet Take 1 tablet (180 mg total) by mouth daily. 301 tablet 1  . hydrochlorothiazide (HYDRODIURIL) 25 MG tablet Take 1 tablet (25 mg total) by mouth daily. 90 tablet 0  . ibuprofen (ADVIL,MOTRIN) 600 MG tablet Take 1 tablet (600 mg total) by mouth every 6 (six) hours as needed. 90 tablet 0  . losartan (COZAAR) 100 MG tablet Take 1 tablet (100 mg total) by mouth at bedtime. 90 tablet 0  . medroxyPROGESTERone (PROVERA) 10 MG tablet Take 1 tablet (10 mg total) by mouth daily. 90 tablet 3  . metFORMIN (GLUCOPHAGE) 500 MG tablet Take 1 tablet (500 mg total) by mouth 2 (two) times daily with a meal. 180 tablet 0  . metoprolol succinate (TOPROL-XL) 50 MG 24 hr tablet Take 1 tablet (50 mg total) by mouth daily. Take with or immediately following a meal. 90 tablet 0  . montelukast (SINGULAIR) 10 MG tablet Take 1 tablet (10 mg total) by mouth at bedtime as needed. 90 tablet 1  . Omega-3 Fatty Acids (FISH OIL PO) Take 1 capsule by mouth daily.    . pantoprazole (PROTONIX) 40 MG tablet TAKE 1  TABLET BY MOUTH ONCE DAILY 90 tablet 1  . Probiotic Product (PROBIOTIC-10 PO) Take by mouth daily.    . TURMERIC PO Take 1 capsule by mouth daily.    [name] azelastine (ASTELIN) 0.1 % nasal spray Place 2 sprays into both nostrils 2 (two) times daily. Use in  each nostril as directed 90 mL 1  . fluticasone (FLONASE) 50 MCG/ACT nasal spray Place 2 sprays into both nostrils daily as needed for allergies. 48 g 1  . potassium chloride SA (KLOR-CON) 20 MEQ tablet TAKE 1 TABLET BY MOUTH THREE TIMES DAILY 270 tablet 1   No facility-administered medications prior to visit.    Allergies  Allergen Reactions  . Penicillins Itching and Swelling    Has patient had a PCN reaction causing immediate rash, facial/tongue/throat swelling, SOB or lightheadedness with hypotension: yes Has patient had a PCN reaction causing severe rash involving mucus membranes or skin necrosis: no Has patient had a PCN reaction that required hospitalization no Has patient had a PCN reaction occurring within the last 10 years: yes If all of the above answers are "NO", then may proceed with Cephalosporin use.   [name] [Liraglutide]     Vomiting    Review of Systems  Constitutional: Positive for malaise/fatigue. Negative for fever.  HENT: Positive for congestion.   Eyes: Negative for blurred vision.  Respiratory: Negative for cough and shortness of breath.   Cardiovascular: Negative for chest pain, palpitations and leg swelling.  Gastrointestinal: Negative for abdominal pain, blood in stool and nausea.  Genitourinary: Negative for dysuria and frequency.  Musculoskeletal: Negative for falls.  Skin: Negative for rash.  Neurological: Negative for dizziness, loss of consciousness and headaches.  Endo/Heme/Allergies: Negative for environmental allergies.  Psychiatric/Behavioral: Negative for depression. The patient is nervous/anxious.        Objective:    Physical Exam Constitutional:      Appearance: Normal appearance.  She is not ill-appearing.  HENT:     Head: Normocephalic.     Right Ear: External ear normal.     Left Ear: External ear normal.     Nose: Nose normal.  Pulmonary:     Effort: Pulmonary effort is normal.  Neurological:     Mental Status: She is alert and oriented to person, place, and time.  Psychiatric:        Behavior: Behavior normal.     BP 133/78   Pulse 80  Wt Readings from Last 3 Encounters:  03/09/20 (!) 315 lb (142.9 kg)  02/22/20 (!) 318 lb (144.2 kg)  02/16/20 (!) 311 lb (141.1 kg)    Diabetic Foot Exam - Simple   No data filed     Lab Results  Component Value Date   WBC 7.6 07/07/2019   HGB 12.3 07/07/2019   HCT 37.3 07/07/2019   PLT 304 07/07/2019   GLUCOSE 99 12/01/2019   CHOL 129 07/07/2019   TRIG 106 07/07/2019   HDL 33 (L) 07/07/2019   LDLCALC 76 07/07/2019   ALT 11 12/01/2019   AST 11 12/01/2019   NA 141 12/01/2019   K 4.1 12/01/2019   CL 103 12/01/2019   CREATININE 0.80 12/01/2019   BUN 11 12/01/2019   CO2 22 12/01/2019   TSH 1.410 07/07/2019   HGBA1C 5.9 (H) 12/01/2019   MICROALBUR 0.50 12/15/2012    Lab Results  Component Value Date   TSH 1.410 07/07/2019   Lab Results  Component Value Date   WBC 7.6 07/07/2019   HGB 12.3 07/07/2019   HCT 37.3 07/07/2019   MCV 84 07/07/2019   PLT 304 07/07/2019   Lab Results  Component Value Date   NA 141 12/01/2019   K 4.1 12/01/2019   CO2 22 12/01/2019   GLUCOSE 99 12/01/2019   BUN 11 12/01/2019   CREATININE 0.80  12/01/2019   BILITOT 0.3 12/01/2019   ALKPHOS 86 12/01/2019   AST 11 12/01/2019   ALT 11 12/01/2019   PROT 7.0 12/01/2019   ALBUMIN 4.2 12/01/2019   CALCIUM 9.3 12/01/2019   GFR 136.18 03/13/2018   Lab Results  Component Value Date   CHOL 129 07/07/2019   Lab Results  Component Value Date   HDL 33 (L) 07/07/2019   Lab Results  Component Value Date   LDLCALC 76 07/07/2019   Lab Results  Component Value Date   TRIG 106 07/07/2019   Lab Results  Component  Value Date   CHOLHDL 3 03/13/2018   Lab Results  Component Value Date   HGBA1C 5.9 (H) 12/01/2019       Assessment & Plan:   Problem List Items Addressed This Visit    Essential hypertension - Primary    Monitor and report any concerns, no changes to meds. Encouraged heart healthy diet such as the DASH diet and exercise as tolerated.       Relevant Orders   TSH   T4, free   Thyroid peroxidase antibody   Allergic rhinitis    Given rx on Astelin and Flonase.      Diabetes mellitus type 2 in obese (HCC)     minimize simple carbs. Increase exercise as tolerated. Continue current meds      Anemia   Relevant Orders   CBC   Vitamin B12   Dyslipidemia    Tolerating statin, encouraged heart healthy diet, avoid trans fats, minimize simple carbs and saturated fats. Increase exercise as tolerated      Hypokalemia   Relevant Medications   potassium chloride SA (KLOR-CON) 20 MEQ tablet   Vitamin D deficiency    Supplement and monitor      Relevant Orders   VITAMIN D 25 Hydroxy (Vit-D Deficiency, Fractures)   Type 2 diabetes mellitus without complication, without long-term current use of insulin (HCC)    hgba1c acceptable, minimize simple carbs. Increase exercise as tolerated. Continue current meds      Relevant Orders   Hemoglobin A1c   Comprehensive metabolic panel   Insulin, random   Hypertriglyceridemia   Relevant Orders   Lipid panel    Other Visit Diagnoses    Allergy, initial encounter       Relevant Medications   potassium chloride SA (KLOR-CON) 20 MEQ tablet   fluticasone (FLONASE) 50 MCG/ACT nasal spray   Benign essential HTN       Relevant Medications   potassium chloride SA (KLOR-CON) 20 MEQ tablet      I have changed Brandi Lester "[name] Walker"'s potassium chloride SA. I am also having her maintain her fexofenadine, TURMERIC PO, Omega-3 Fatty Acids (FISH OIL PO), ibuprofen, Probiotic Product (PROBIOTIC-10 PO), acetaminophen, EPINEPHrine,  montelukast, cholecalciferol, cyclobenzaprine, pantoprazole, buPROPion, metoprolol succinate, atorvastatin, medroxyPROGESTERone, hydrochlorothiazide, amLODipine, losartan, metFORMIN, azelastine, and fluticasone.  Meds ordered this encounter  Medications  . potassium chloride SA (KLOR-CON) 20 MEQ tablet    Sig: Take 1 tablet (20 mEq total) by mouth 3 (three) times daily.    Dispense:  270 tablet    Refill:  1  . azelastine (ASTELIN) 0.1 % nasal spray    Sig: Place 2 sprays into both nostrils 2 (two) times daily. Use in each nostril as directed    Dispense:  30 mL    Refill:  2  . fluticasone (FLONASE) 50 MCG/ACT nasal spray    Sig: Place 2 sprays into both nostrils daily as  needed for allergies.    Dispense:  16 g    Refill:  2    I discussed the assessment and treatment plan with the patient. The patient was provided an opportunity to ask questions and all were answered. The patient agreed with the plan and demonstrated an understanding of the instructions.   The patient was advised to call back or seek an in-person evaluation if the symptoms worsen or if the condition fails to improve as anticipated.  I provided 25 minutes of non-face-to-face time during this encounter.   Penni Homans, MD

## 2020-03-19 NOTE — Assessment & Plan Note (Signed)
Monitor and report any concerns, no changes to meds. Encouraged heart healthy diet such as the DASH diet and exercise as tolerated.  ?

## 2020-03-19 NOTE — Assessment & Plan Note (Signed)
minimize simple carbs. Increase exercise as tolerated. Continue current meds  

## 2020-03-19 NOTE — Assessment & Plan Note (Signed)
hgba1c acceptable, minimize simple carbs. Increase exercise as tolerated. Continue current meds 

## 2020-03-19 NOTE — Assessment & Plan Note (Signed)
Supplement and monitor 

## 2020-03-21 ENCOUNTER — Other Ambulatory Visit: Payer: Self-pay

## 2020-03-21 ENCOUNTER — Other Ambulatory Visit (INDEPENDENT_AMBULATORY_CARE_PROVIDER_SITE_OTHER): Payer: BC Managed Care – PPO

## 2020-03-21 DIAGNOSIS — E559 Vitamin D deficiency, unspecified: Secondary | ICD-10-CM

## 2020-03-21 DIAGNOSIS — E781 Pure hyperglyceridemia: Secondary | ICD-10-CM | POA: Diagnosis not present

## 2020-03-21 DIAGNOSIS — E119 Type 2 diabetes mellitus without complications: Secondary | ICD-10-CM

## 2020-03-21 DIAGNOSIS — I10 Essential (primary) hypertension: Secondary | ICD-10-CM | POA: Diagnosis not present

## 2020-03-21 DIAGNOSIS — D649 Anemia, unspecified: Secondary | ICD-10-CM

## 2020-03-21 LAB — COMPREHENSIVE METABOLIC PANEL
ALT: 10 U/L (ref 0–35)
AST: 8 U/L (ref 0–37)
Albumin: 4 g/dL (ref 3.5–5.2)
Alkaline Phosphatase: 68 U/L (ref 39–117)
BUN: 14 mg/dL (ref 6–23)
CO2: 29 mEq/L (ref 19–32)
Calcium: 9.1 mg/dL (ref 8.4–10.5)
Chloride: 102 mEq/L (ref 96–112)
Creatinine, Ser: 0.77 mg/dL (ref 0.40–1.20)
GFR: 95.31 mL/min (ref >60.00)
Glucose, Bld: 97 mg/dL (ref 70–99)
Potassium: 3.9 mEq/L (ref 3.5–5.1)
Sodium: 138 mEq/L (ref 135–145)
Total Bilirubin: 0.3 mg/dL (ref 0.2–1.2)
Total Protein: 6.6 g/dL (ref 6.0–8.3)

## 2020-03-21 LAB — LIPID PANEL
Cholesterol: 106 mg/dL (ref 0–200)
HDL: 33 mg/dL — ABNORMAL LOW (ref >39.00)
LDL Cholesterol: 60 mg/dL (ref 0–99)
NonHDL: 72.85
Total CHOL/HDL Ratio: 3
Triglycerides: 65 mg/dL (ref 0.0–149.0)
VLDL: 13 mg/dL (ref 0.0–40.0)

## 2020-03-21 LAB — CBC
HCT: 36.4 % (ref 36.0–46.0)
Hemoglobin: 12.2 g/dL (ref 12.0–15.0)
MCHC: 33.6 g/dL (ref 30.0–36.0)
MCV: 84.5 fl (ref 78.0–100.0)
Platelets: 281 10*3/uL (ref 150.0–400.0)
RBC: 4.3 Mil/uL (ref 3.87–5.11)
RDW: 14.5 % (ref 11.5–15.5)
WBC: 7.9 10*3/uL (ref 4.0–10.5)

## 2020-03-21 LAB — VITAMIN B12: Vitamin B-12: 319 pg/mL (ref 211–911)

## 2020-03-21 LAB — HEMOGLOBIN A1C: Hgb A1c MFr Bld: 6.1 % (ref 4.6–6.5)

## 2020-03-21 LAB — VITAMIN D 25 HYDROXY (VIT D DEFICIENCY, FRACTURES): VITD: 56.44 ng/mL (ref 30.00–100.00)

## 2020-03-21 LAB — TSH: TSH: 1.76 u[IU]/mL (ref 0.35–4.50)

## 2020-03-21 LAB — T4, FREE: Free T4: 1.05 ng/dL (ref 0.60–1.60)

## 2020-03-22 LAB — THYROID PEROXIDASE ANTIBODY: Thyroperoxidase Ab SerPl-aCnc: 1 IU/mL (ref <9)

## 2020-03-22 LAB — INSULIN, RANDOM: Insulin: 14.6 u[IU]/mL

## 2020-03-30 ENCOUNTER — Other Ambulatory Visit: Payer: Self-pay

## 2020-03-30 ENCOUNTER — Encounter (INDEPENDENT_AMBULATORY_CARE_PROVIDER_SITE_OTHER): Payer: Self-pay | Admitting: Family Medicine

## 2020-03-30 ENCOUNTER — Ambulatory Visit (INDEPENDENT_AMBULATORY_CARE_PROVIDER_SITE_OTHER): Payer: BC Managed Care – PPO | Admitting: Family Medicine

## 2020-03-30 VITALS — BP 132/84 | HR 91 | Temp 98.8°F | Ht 70.0 in | Wt 321.0 lb

## 2020-03-30 DIAGNOSIS — F3289 Other specified depressive episodes: Secondary | ICD-10-CM | POA: Diagnosis not present

## 2020-03-30 DIAGNOSIS — Z6841 Body Mass Index (BMI) 40.0 and over, adult: Secondary | ICD-10-CM

## 2020-03-30 DIAGNOSIS — E1169 Type 2 diabetes mellitus with other specified complication: Secondary | ICD-10-CM | POA: Diagnosis not present

## 2020-03-30 MED ORDER — BUPROPION HCL ER (SR) 150 MG PO TB12: 150.0000 mg | ORAL_TABLET | Freq: Every morning | ORAL | 0 refills | Status: DC

## 2020-03-30 MED FILL — BUPROPION HCL ER (SR) 150 M: 150 | 30 days supply | Qty: 30 | Fill #0

## 2020-03-30 NOTE — Progress Notes (Signed)
Chief Complaint:   OBESITY Brandi Lester is here to discuss her progress with her obesity treatment plan along with follow-up of her obesity related diagnoses. [name] is on the Category 4 Plan and states she is following her eating plan approximately 50% of the time. [name] states she is walking and riding the stationary bike for 60 minutes 4 times per week.  Today's visit was #: 16 Starting weight: 325 lbs Starting date: 05/23/2017 Today's weight: 321 lbs Today's date: 03/30/2020 Total lbs lost to date: 4 Total lbs lost since last in-office visit: 0  Interim History: Brandi Lester notes being out of school for summer has caused her to be less active and have more time to snack. She is on the plan at breakfast and lunch. She is going over on extra calories. She will be going to [facility] next week for a vacation and does plan on making healthy choices..   Subjective:   1. Type 2 diabetes mellitus with other specified complication, without long-term current use of insulin (HCC) Brandi Lester's diabetes mellitus is well controlled on metformin.  Lab Results  Component Value Date   HGBA1C 6.1 03/21/2020   HGBA1C 5.9 (H) 12/01/2019   HGBA1C 5.9 (H) 07/07/2019   Lab Results  Component Value Date   MICROALBUR 0.50 12/15/2012   LDLCALC 60 03/21/2020   CREATININE 0.77 03/21/2020   Lab Results  Component Value Date   INSULIN 20.7 12/01/2019   INSULIN 12.9 07/07/2019   INSULIN 21.2 11/20/2018   INSULIN 15.4 07/14/2018   INSULIN 12.9 09/12/2017   2. Other depression, with emotional eating [name] feels bupropion is beneficial for cravings.  Assessment/Plan:   1. Type 2 diabetes mellitus with other specified complication, without long-term current use of insulin (HCC) Good blood sugar control is important to decrease the likelihood of diabetic complications such as nephropathy, neuropathy, limb loss, blindness, coronary artery disease, and death. Intensive lifestyle modification including  diet, exercise and weight loss are the first line of treatment for diabetes. [name] will continue metformin, and she will continue to follow as directed.  2. Other depression, with emotional eating Behavior modification techniques were discussed today to help Bentley deal with her emotional/non-hunger eating behaviors. We will refill bupropion for 1 month, and may consider increasing dose. Orders and follow up as documented in patient record.   - buPROPion (WELLBUTRIN SR) 150 MG 12 hr tablet; Take 1 tablet (150 mg total) by mouth in the morning.  Dispense: 30 tablet; Refill: 0  3. Class 3 severe obesity with serious comorbidity and body mass index (BMI) of 45.0 to 49.9 in adult, unspecified obesity type (HCC) Brandi Lester is currently in the action stage of change. As such, her goal is to continue with weight loss efforts. She has agreed to the Category 4 Plan.   Exercise goals: As is.  Behavioral modification strategies: increasing lean protein intake, decreasing simple carbohydrates and travel eating strategies.  Brandi Lester has agreed to follow-up with our clinic in 2 to 3 weeks. She was informed of the importance of frequent follow-up visits to maximize her success with intensive lifestyle modifications for her multiple health conditions.   Objective:   Blood pressure (!) 132/84, pulse 91, temperature 98.8 F (37.1 C), temperature source Oral, height 5\' 10"  (1.778 m), weight (!) 321 lb (145.6 kg), SpO2 99 %. Body mass index is 46.06 kg/m.  General: Cooperative, alert, well developed, in no acute distress. HEENT: Conjunctivae and lids unremarkable. Cardiovascular: Regular rhythm.  Lungs: Normal work of breathing.  Neurologic: No focal deficits.   Lab Results  Component Value Date   CREATININE 0.77 03/21/2020   BUN 14 03/21/2020   NA 138 03/21/2020   K 3.9 03/21/2020   CL 102 03/21/2020   CO2 29 03/21/2020   Lab Results  Component Value Date   ALT 10 03/21/2020   AST 8 03/21/2020    ALKPHOS 68 03/21/2020   BILITOT 0.3 03/21/2020   Lab Results  Component Value Date   HGBA1C 6.1 03/21/2020   HGBA1C 5.9 (H) 12/01/2019   HGBA1C 5.9 (H) 07/07/2019   HGBA1C 6.1 (H) 11/20/2018   HGBA1C 5.9 (H) 07/14/2018   Lab Results  Component Value Date   INSULIN 20.7 12/01/2019   INSULIN 12.9 07/07/2019   INSULIN 21.2 11/20/2018   INSULIN 15.4 07/14/2018   INSULIN 12.9 09/12/2017   Lab Results  Component Value Date   TSH 1.76 03/21/2020   Lab Results  Component Value Date   CHOL 106 03/21/2020   HDL 33.00 (L) 03/21/2020   LDLCALC 60 03/21/2020   TRIG 65.0 03/21/2020   CHOLHDL 3 03/21/2020   Lab Results  Component Value Date   WBC 7.9 03/21/2020   HGB 12.2 03/21/2020   HCT 36.4 03/21/2020   MCV 84.5 03/21/2020   PLT 281.0 03/21/2020   No results found for: IRON, TIBC, FERRITIN  Attestation Statements:   Reviewed by clinician on day of visit: allergies, medications, problem list, medical history, surgical history, family history, social history, and previous encounter notes.   Wilhemena Durie, am acting as Location manager for [employer], FNP-C.  I have reviewed the above documentation for accuracy and completeness, and I agree with the above. -  Georgianne Fick, FNP

## 2020-03-31 ENCOUNTER — Encounter (INDEPENDENT_AMBULATORY_CARE_PROVIDER_SITE_OTHER): Payer: Self-pay | Admitting: Family Medicine

## 2020-04-08 MED FILL — BUPROPION HCL ER (SR) 150 M: 150 | 30 days supply | Qty: 30 | Fill #0

## 2020-04-18 ENCOUNTER — Ambulatory Visit (INDEPENDENT_AMBULATORY_CARE_PROVIDER_SITE_OTHER): Payer: BC Managed Care – PPO | Admitting: Family Medicine

## 2020-04-18 ENCOUNTER — Encounter (INDEPENDENT_AMBULATORY_CARE_PROVIDER_SITE_OTHER): Payer: Self-pay | Admitting: Family Medicine

## 2020-04-18 ENCOUNTER — Other Ambulatory Visit: Payer: Self-pay

## 2020-04-18 VITALS — BP 127/79 | HR 89 | Temp 98.6°F | Ht 70.0 in | Wt 323.0 lb

## 2020-04-18 DIAGNOSIS — F3289 Other specified depressive episodes: Secondary | ICD-10-CM

## 2020-04-18 DIAGNOSIS — Z6841 Body Mass Index (BMI) 40.0 and over, adult: Secondary | ICD-10-CM

## 2020-04-18 DIAGNOSIS — M79662 Pain in left lower leg: Secondary | ICD-10-CM

## 2020-04-18 MED ORDER — BUPROPION HCL ER (SR) 150 MG PO TB12: 150.0000 mg | ORAL_TABLET | Freq: Every morning | ORAL | 0 refills | Status: DC

## 2020-04-19 ENCOUNTER — Encounter (INDEPENDENT_AMBULATORY_CARE_PROVIDER_SITE_OTHER): Payer: Self-pay | Admitting: Family Medicine

## 2020-04-19 NOTE — Progress Notes (Signed)
Chief Complaint:   OBESITY Brandi Lester is here to discuss her progress with her obesity treatment plan along with follow-up of her obesity related diagnoses. Brandi Lester is on the Category 4 Plan and states she is following her eating plan approximately 50% of the time. Brandi Lester states she is walking for 30-45 minutes 7 times per week.  Today's visit was #: 70 Starting weight: 325 lbs Starting date: 05/23/2017 Today's weight: 323 lbs Today's date: 04/18/2020 Total lbs lost to date: 2 Total lbs lost since last in-office visit: 0  Interim History: Brandi Lester just got back from vacation to [facility]. She did not stick to the plan. However, she is now working on meal prepping and getting back on the plan. She wants to continue with Category 4 plan for the structure.  Subjective:   1. Pain of left lower leg Brandi Lester fell up the stairs a few weeks ago and injured her left shin. She notes the pain is improving with intermittent rest, ice, elevation, and ibuprofen.  2. Other depression, with emotional eating Brandi Lester feels she is snacking less. Blood pressure is within normal limits.  Assessment/Plan:   1. Pain of left lower leg Brandi Lester will continue all current measures. he will see her primary care physician if her left leg pain worsens.  2. Other depression, with emotional eating Behavior modification techniques were discussed today to help Brandi Lester deal with her emotional/non-hunger eating behaviors. We will refill bupropion for 1 month. Orders and follow up as documented in patient record.   - buPROPion (WELLBUTRIN SR) 150 MG 12 hr tablet; Take 1 tablet (150 mg total) by mouth in the morning.  Dispense: 30 tablet; Refill: 0  3. Class 3 severe obesity with serious comorbidity and body mass index (BMI) of 45.0 to 49.9 in adult, unspecified obesity type (HCC) Brandi Lester is currently in the action stage of change. As such, her goal is to continue with weight loss efforts. She has agreed to the Category  4 Plan.   Exercise goals: As is.  Behavioral modification strategies: increasing lean protein intake, decreasing simple carbohydrates and planning for success.  Brandi Lester has agreed to follow-up with our clinic in 4 weeks. She was informed of the importance of frequent follow-up visits to maximize her success with intensive lifestyle modifications for her multiple health conditions.   Objective:   Blood pressure 127/79, pulse 89, temperature 98.6 F (37 C), temperature source Oral, height 5\' 10"  (1.778 m), weight (!) 323 lb (146.5 kg), SpO2 98 %. Body mass index is 46.35 kg/m.  General: Cooperative, alert, well developed, in no acute distress. HEENT: Conjunctivae and lids unremarkable. Cardiovascular: Regular rhythm.  Lungs: Normal work of breathing. Neurologic: No focal deficits.   Lab Results  Component Value Date   CREATININE 0.77 03/21/2020   BUN 14 03/21/2020   NA 138 03/21/2020   K 3.9 03/21/2020   CL 102 03/21/2020   CO2 29 03/21/2020   Lab Results  Component Value Date   ALT 10 03/21/2020   AST 8 03/21/2020   ALKPHOS 68 03/21/2020   BILITOT 0.3 03/21/2020   Lab Results  Component Value Date   HGBA1C 6.1 03/21/2020   HGBA1C 5.9 (H) 12/01/2019   HGBA1C 5.9 (H) 07/07/2019   HGBA1C 6.1 (H) 11/20/2018   HGBA1C 5.9 (H) 07/14/2018   Lab Results  Component Value Date   INSULIN 20.7 12/01/2019   INSULIN 12.9 07/07/2019   INSULIN 21.2 11/20/2018   INSULIN 15.4 07/14/2018   INSULIN 12.9 09/12/2017  Lab Results  Component Value Date   TSH 1.76 03/21/2020   Lab Results  Component Value Date   CHOL 106 03/21/2020   HDL 33.00 (L) 03/21/2020   LDLCALC 60 03/21/2020   TRIG 65.0 03/21/2020   CHOLHDL 3 03/21/2020   Lab Results  Component Value Date   WBC 7.9 03/21/2020   HGB 12.2 03/21/2020   HCT 36.4 03/21/2020   MCV 84.5 03/21/2020   PLT 281.0 03/21/2020   No results found for: IRON, TIBC, FERRITIN  Attestation Statements:   Reviewed by clinician on  day of visit: allergies, medications, problem list, medical history, surgical history, family history, social history, and previous encounter notes.   Wilhemena Durie, am acting as Location manager for [employer], FNP-C.  I have reviewed the above documentation for accuracy and completeness, and I agree with the above. -  Georgianne Fick, FNP

## 2020-04-25 MED FILL — MEDROXYPROGESTERONE 10 MG T: 10 | 90 days supply | Qty: 90 | Fill #2

## 2020-05-11 ENCOUNTER — Encounter: Payer: Self-pay | Admitting: Internal Medicine

## 2020-05-11 ENCOUNTER — Ambulatory Visit (AMBULATORY_SURGERY_CENTER): Payer: Self-pay | Admitting: *Deleted

## 2020-05-11 ENCOUNTER — Other Ambulatory Visit: Payer: Self-pay

## 2020-05-11 VITALS — Ht 70.0 in | Wt 323.0 lb

## 2020-05-11 DIAGNOSIS — Z1211 Encounter for screening for malignant neoplasm of colon: Secondary | ICD-10-CM

## 2020-05-11 MED ORDER — SUTAB 1479-225-188 MG PO TABS: 24.0000 | ORAL_TABLET | ORAL | 0 refills | Status: DC

## 2020-05-11 MED FILL — SUTAB 1479-225-188 MG TABS: 1479-225-18 | 1 days supply | Qty: 24 | Fill #0

## 2020-05-11 NOTE — Progress Notes (Signed)
cov vax x 2  No egg or soy allergy known to patient  No issues with past sedation with any surgeries or procedures no intubation problems in the past  No FH of Malignant Hyperthermia No diet pills per patient No home 02 use per patient  No blood thinners per patient  Pt denies issues with constipation  No A fib or A flutter  EMMI video to pt or via MyChart  COVID 19 guidelines implemented in [facility] today with Pt and RN   sutab  Coupon given to pt in [facility] today , Code to Pharmacy   Due to the COVID-19 pandemic we are asking patients to follow these guidelines. Please only bring one care partner. Please be aware that your care partner may wait in the car in the parking lot or if they feel like they will be too hot to wait in the car, they may wait in the lobby on the 4th floor. All care partners are required to wear a mask the entire time (we do not have any that we can provide them), they need to practice social distancing, and we will do a Covid check for all patient's and care partners when you arrive. Also we will check their temperature and your temperature. If the care partner waits in their car they need to stay in the parking lot the entire time and we will call them on their cell phone when the patient is ready for discharge so they can bring the car to the front of the building. Also all patient's will need to wear a mask into building.  

## 2020-05-13 MED FILL — ATORVASTATIN CALCIUM 10 MG: 10 | 90 days supply | Qty: 45 | Fill #1

## 2020-05-18 ENCOUNTER — Other Ambulatory Visit: Payer: Self-pay

## 2020-05-18 ENCOUNTER — Encounter (INDEPENDENT_AMBULATORY_CARE_PROVIDER_SITE_OTHER): Payer: Self-pay | Admitting: Family Medicine

## 2020-05-18 ENCOUNTER — Ambulatory Visit (INDEPENDENT_AMBULATORY_CARE_PROVIDER_SITE_OTHER): Payer: BC Managed Care – PPO | Admitting: Family Medicine

## 2020-05-18 VITALS — BP 144/82 | HR 96 | Temp 98.4°F | Ht 70.0 in | Wt 323.0 lb

## 2020-05-18 DIAGNOSIS — I10 Essential (primary) hypertension: Secondary | ICD-10-CM

## 2020-05-18 DIAGNOSIS — I152 Hypertension secondary to endocrine disorders: Secondary | ICD-10-CM

## 2020-05-18 DIAGNOSIS — Z6841 Body Mass Index (BMI) 40.0 and over, adult: Secondary | ICD-10-CM

## 2020-05-18 DIAGNOSIS — E1159 Type 2 diabetes mellitus with other circulatory complications: Secondary | ICD-10-CM | POA: Diagnosis not present

## 2020-05-18 DIAGNOSIS — E1169 Type 2 diabetes mellitus with other specified complication: Secondary | ICD-10-CM

## 2020-05-18 DIAGNOSIS — F3289 Other specified depressive episodes: Secondary | ICD-10-CM

## 2020-05-18 MED ORDER — BUPROPION HCL ER (SR) 150 MG PO TB12: 150.0000 mg | ORAL_TABLET | Freq: Every morning | ORAL | 0 refills | Status: DC

## 2020-05-18 MED ORDER — METFORMIN HCL 500 MG PO TABS: 500.0000 mg | ORAL_TABLET | Freq: Two times a day (BID) | ORAL | 0 refills | Status: DC

## 2020-05-18 MED ORDER — METOPROLOL SUCCINATE ER 50 MG PO TB24: 50.0000 mg | ORAL_TABLET | Freq: Every day | ORAL | 0 refills | Status: DC

## 2020-05-18 MED FILL — METFORMIN HCL 500 MG TABS: 500 | 90 days supply | Qty: 180 | Fill #0

## 2020-05-18 MED FILL — METOPROLOL SUCCINATE ER 50: 50 | 90 days supply | Qty: 90 | Fill #0

## 2020-05-18 MED FILL — BUPROPION HCL ER (SR) 150 M: 150 | 30 days supply | Qty: 30 | Fill #0

## 2020-05-19 ENCOUNTER — Encounter (INDEPENDENT_AMBULATORY_CARE_PROVIDER_SITE_OTHER): Payer: Self-pay | Admitting: Family Medicine

## 2020-05-19 NOTE — Progress Notes (Signed)
Chief Complaint:   OBESITY Brandi Lester is here to discuss her progress with her obesity treatment plan along with follow-up of her obesity related diagnoses. Brandi Lester is on the Category 4 Plan and states she is following her eating plan approximately 70% of the time. Brandi Lester states she is walking and biking for 30-45 minutes 3 times per week.  Today's visit was #: 27 Starting weight: 325 lbs Starting date: 05/23/2017 Today's weight: 323 lbs Today's date: 05/18/2020 Total lbs lost to date: 2 Total lbs lost since last in-office visit: 0  Interim History: Camylle has been on the plan at breakfast and lunch but then sometimes eats out at supper. She sometimes eats fast food on the way home from work despite having food available at home. She feels this is due to stress and fatigue.  Subjective:   1. Hypertension associated with type 2 diabetes mellitus ([place]) Brandi Lester's blood pressure is slightly elevated today. She is compliant with all of her medications (metoprolol, losartan, hydrochlorothiazide, and Norvasc). Cardiovascular ROS: no chest pain or dyspnea on exertion.  BP Readings from Last 3 Encounters:  05/18/20 (!) 144/82  04/18/20 127/79  03/30/20 (!) 132/84   Lab Results  Component Value Date   CREATININE 0.77 03/21/2020   CREATININE 0.80 12/01/2019   CREATININE 0.60 07/07/2019   2. Type 2 diabetes mellitus with other specified complication, without long-term current use of insulin ([place]) Brandi Lester notes polyphagia. Her diabetes mellitus is well controlled with metformin. We discussed starting GLP-1 and she will consider this. She had nausea/vomiting with Victoza in the past.  Lab Results  Component Value Date   HGBA1C 6.1 03/21/2020   HGBA1C 5.9 (H) 12/01/2019   HGBA1C 5.9 (H) 07/07/2019   Lab Results  Component Value Date   MICROALBUR 0.50 12/15/2012   LDLCALC 60 03/21/2020   CREATININE 0.77 03/21/2020   Lab Results  Component Value Date   INSULIN 20.7 12/01/2019    INSULIN 12.9 07/07/2019   INSULIN 21.2 11/20/2018   INSULIN 15.4 07/14/2018   INSULIN 12.9 09/12/2017   3. Other depression, with emotional eating Brandi Lester notes cravings, and her blood pressure is elevated so we will not increase her bupropion at this time.  Assessment/Plan:   1. Hypertension associated with type 2 diabetes mellitus (HCC)  We will refill metoprolol XL for 90 days, with no refill.  - metoprolol succinate (TOPROL-XL) 50 MG 24 hr tablet; Take 1 tablet (50 mg total) by mouth daily. Take with or immediately following a meal.  Dispense: 90 tablet; Refill: 0  2. Type 2 diabetes mellitus with other specified complication, without long-term current use of insulin (HCC)  Brandi Lester will continue her medications, and we will refill metformin for 90 days, with no refill.  - metFORMIN (GLUCOPHAGE) 500 MG tablet; Take 1 tablet (500 mg total) by mouth 2 (two) times daily with a meal.  Dispense: 180 tablet; Refill: 0  3. Other depression, with emotional eating Refill bupropion for 1 month.    - buPROPion (WELLBUTRIN SR) 150 MG 12 hr tablet; Take 1 tablet (150 mg total) by mouth in the morning.  Dispense: 30 tablet; Refill: 0  4. Class 3 severe obesity with serious comorbidity and body mass index (BMI) of 45.0 to 49.9 in adult, unspecified obesity type (HCC) Brandi Lester is currently in the action stage of change. As such, her goal is to continue with weight loss efforts. She has agreed to the Category 4 Plan.   Handout given today: Eating Out. Brandi Lester is  to have a snack before leaving to go home from work.  Exercise goals: As is.  Behavioral modification strategies: increasing lean protein intake, decreasing simple carbohydrates and meal planning and cooking strategies.  Brandi Lester has agreed to follow-up with our clinic in 3 weeks.  Objective:   Blood pressure (!) 144/82, pulse 96, temperature 98.4 F (36.9 C), height 5\' 10"  (1.778 m), weight (!) 323 lb (146.5 kg), SpO2 99 %. Body mass  index is 46.35 kg/m.  General: Cooperative, alert, well developed, in no acute distress. HEENT: Conjunctivae and lids unremarkable. Cardiovascular: Regular rhythm.  Lungs: Normal work of breathing. Neurologic: No focal deficits.   Lab Results  Component Value Date   CREATININE 0.77 03/21/2020   BUN 14 03/21/2020   NA 138 03/21/2020   K 3.9 03/21/2020   CL 102 03/21/2020   CO2 29 03/21/2020   Lab Results  Component Value Date   ALT 10 03/21/2020   AST 8 03/21/2020   ALKPHOS 68 03/21/2020   BILITOT 0.3 03/21/2020   Lab Results  Component Value Date   HGBA1C 6.1 03/21/2020   HGBA1C 5.9 (H) 12/01/2019   HGBA1C 5.9 (H) 07/07/2019   HGBA1C 6.1 (H) 11/20/2018   HGBA1C 5.9 (H) 07/14/2018   Lab Results  Component Value Date   INSULIN 20.7 12/01/2019   INSULIN 12.9 07/07/2019   INSULIN 21.2 11/20/2018   INSULIN 15.4 07/14/2018   INSULIN 12.9 09/12/2017   Lab Results  Component Value Date   TSH 1.76 03/21/2020   Lab Results  Component Value Date   CHOL 106 03/21/2020   HDL 33.00 (L) 03/21/2020   LDLCALC 60 03/21/2020   TRIG 65.0 03/21/2020   CHOLHDL 3 03/21/2020   Lab Results  Component Value Date   WBC 7.9 03/21/2020   HGB 12.2 03/21/2020   HCT 36.4 03/21/2020   MCV 84.5 03/21/2020   PLT 281.0 03/21/2020   No results found for: IRON, TIBC, FERRITIN  Attestation Statements:   Reviewed by clinician on day of visit: allergies, medications, problem list, medical history, surgical history, family history, social history, and previous encounter notes.   Wilhemena Durie, am acting as Location manager for [employer], FNP-C.  I have reviewed the above documentation for accuracy and completeness, and I agree with the above. -  Georgianne Fick, FNP

## 2020-05-23 ENCOUNTER — Other Ambulatory Visit: Payer: Self-pay

## 2020-05-23 ENCOUNTER — Encounter: Payer: Self-pay | Admitting: Internal Medicine

## 2020-05-23 ENCOUNTER — Ambulatory Visit (AMBULATORY_SURGERY_CENTER): Payer: BC Managed Care – PPO | Admitting: Internal Medicine

## 2020-05-23 VITALS — BP 131/76 | HR 90 | Temp 98.2°F | Resp 15 | Ht 70.0 in | Wt 323.0 lb

## 2020-05-23 DIAGNOSIS — Z1211 Encounter for screening for malignant neoplasm of colon: Secondary | ICD-10-CM

## 2020-05-23 MED ORDER — SODIUM CHLORIDE 0.9 % IV SOLN: 500.0000 mL | INTRAVENOUS | Status: DC

## 2020-05-23 NOTE — Progress Notes (Signed)
pt tolerated well. VSS. awake and to recovery. Report given to RN. Pt informed of copious secretions and need for OA. No trauma to mouth noted. Pt with a good understanding.

## 2020-05-23 NOTE — Op Note (Signed)
[place] Patient Name: Jataya Wann Procedure Date: 05/23/2020 9:50 AM MRN: [id number] Endoscopist: Docia Chuck. Henrene Pastor , MD Age: 54 Referring MD:  Date of Birth: April 19, 1966 Gender: Female Account #: [id number] Procedure:                Colonoscopy Indications:              Screening for colorectal malignant neoplasm Medicines:                Monitored Anesthesia Care Procedure:                Pre-Anesthesia Assessment:                           - Prior to the procedure, a History and Physical                            was performed, and patient medications and                            allergies were reviewed. The patient's tolerance of                            previous anesthesia was also reviewed. The risks                            and benefits of the procedure and the sedation                            options and risks were discussed with the patient.                            All questions were answered, and informed consent                            was obtained. Prior Anticoagulants: The patient has                            taken no previous anticoagulant or antiplatelet                            agents. ASA Grade Assessment: II - A patient with                            mild systemic disease. After reviewing the risks                            and benefits, the patient was deemed in                            satisfactory condition to undergo the procedure.                           After obtaining informed consent, the colonoscope  was passed under direct vision. Throughout the                            procedure, the patient's blood pressure, pulse, and                            oxygen saturations were monitored continuously. The                            Colonoscope was introduced through the anus and                            advanced to the the cecum, identified by                            appendiceal orifice and  ileocecal valve. The                            ileocecal valve, appendiceal orifice, and rectum                            were photographed. The quality of the bowel                            preparation was good. The colonoscopy was performed                            without difficulty. The patient tolerated the                            procedure well. The bowel preparation used was                            SUPREP/tablets via split dose instruction. Vomited.                            Called on-call doctor. Change to MiraLAX prep Scope In: 9:57:45 AM Scope Out: 10:13:06 AM Scope Withdrawal Time: 0 hours 12 minutes 6 seconds  Total Procedure Duration: 0 hours 15 minutes 21 seconds  Findings:                 The entire examined colon appeared normal on direct                            and retroflexion views. Complications:            No immediate complications. Estimated blood loss:                            None. Estimated Blood Loss:     Estimated blood loss: none. Impression:               - The entire examined colon is normal on direct and  retroflexion views.                           - No specimens collected. Recommendation:           - Repeat colonoscopy in 10 years for screening                            purposes.                           - Patient has a contact number available for                            emergencies. The signs and symptoms of potential                            delayed complications were discussed with the                            patient. Return to normal activities tomorrow.                            Written discharge instructions were provided to the                            patient.                           - Resume previous diet.                           - Continue present medications. Docia Chuck. Henrene Pastor, MD 05/23/2020 10:19:54 AM This report has been signed electronically.

## 2020-05-23 NOTE — Patient Instructions (Signed)
Read all of your instructions given to you by your recovery room nurse.  Thank-you for choosing [name] for your healthcare needs today.  YOU HAD AN ENDOSCOPIC PROCEDURE TODAY AT [facility]:   Refer to the procedure report that was given to you for any specific questions about what was found during the examination.  If the procedure report does not answer your questions, please call your gastroenterologist to clarify.  If you requested that your care partner not be given the details of your procedure findings, then the procedure report has been included in a sealed envelope for you to review at your convenience later.  YOU SHOULD EXPECT: Some feelings of bloating in the abdomen. Passage of more gas than usual.  Walking can help get rid of the air that was put into your GI tract during the procedure and reduce the bloating. If you had a lower endoscopy (such as a colonoscopy or flexible sigmoidoscopy) you may notice spotting of blood in your stool or on the toilet paper. If you underwent a bowel prep for your procedure, you may not have a normal bowel movement for a few days.  Please Note:  You might notice some irritation and congestion in your nose or some drainage.  This is from the oxygen used during your procedure.  There is no need for concern and it should clear up in a day or so.  SYMPTOMS TO REPORT IMMEDIATELY:   Following lower endoscopy (colonoscopy or flexible sigmoidoscopy):  Excessive amounts of blood in the stool  Significant tenderness or worsening of abdominal pains  Swelling of the abdomen that is new, acute  Fever of 100F or higher   For urgent or emergent issues, a gastroenterologist can be reached at any hour by calling [phone]. Do not use MyChart messaging for urgent concerns.    DIET:  We do recommend a small meal at first, but then you may proceed to your regular diet.  Drink plenty of fluids but you should avoid alcoholic beverages for 24  hours.  ACTIVITY:  You should plan to take it easy for the rest of today and you should NOT DRIVE or use heavy machinery until tomorrow (because of the sedation medicines used during the test).    FOLLOW UP: Our staff will call the number listed on your records 48-72 hours following your procedure to check on you and address any questions or concerns that you may have regarding the information given to you following your procedure. If we do not reach you, we will leave a message.  We will attempt to reach you two times.  During this call, we will ask if you have developed any symptoms of COVID 19. If you develop any symptoms (ie: fever, flu-like symptoms, shortness of breath, cough etc.) before then, please call [phone].  If you test positive for Covid 19 in the 2 weeks post procedure, please call and report this information to [name].     SIGNATURES/CONFIDENTIALITY: You and/or your care partner have signed paperwork which will be entered into your electronic medical record.  These signatures attest to the fact that that the information above on your After Visit Summary has been reviewed and is understood.  Full responsibility of the confidentiality of this discharge information lies with you and/or your care-partner.

## 2020-05-23 NOTE — Progress Notes (Signed)
Vs AG Pt's states no medical or surgical changes since previsit or office visit.

## 2020-05-25 ENCOUNTER — Telehealth: Payer: Self-pay

## 2020-05-25 NOTE — Telephone Encounter (Signed)
  Follow up Call-  Call back number 05/23/2020  Post procedure Call Back phone  # [phone]  Permission to leave phone message Yes  Some recent data might be hidden     Patient questions:  Do you have a fever, pain , or abdominal swelling? No. Pain Score  0 *  Have you tolerated food without any problems? Yes.    Have you been able to return to your normal activities? Yes.    Do you have any questions about your discharge instructions: Diet   No. Medications  No. Follow up visit  No.  Do you have questions or concerns about your Care? No.  Actions: * If pain score is 4 or above: No action needed, pain <4.  1. Have you developed a fever since your procedure? No  2.   Have you had an respiratory symptoms (SOB or cough) since your procedure? no  3.   Have you tested positive for COVID 19 since your procedure no  4.   Have you had any family members/close contacts diagnosed with the COVID 19 since your procedure?  no   If yes to any of these questions please route to Joylene John, RN and Joella Prince, RN

## 2020-05-31 ENCOUNTER — Ambulatory Visit: Payer: BC Managed Care – PPO | Admitting: Family Medicine

## 2020-05-31 ENCOUNTER — Other Ambulatory Visit: Payer: Self-pay | Admitting: Family Medicine

## 2020-05-31 ENCOUNTER — Other Ambulatory Visit: Payer: Self-pay

## 2020-05-31 VITALS — BP 122/78 | HR 90 | Temp 98.7°F | Wt 320.8 lb

## 2020-05-31 DIAGNOSIS — E669 Obesity, unspecified: Secondary | ICD-10-CM

## 2020-05-31 DIAGNOSIS — I152 Hypertension secondary to endocrine disorders: Secondary | ICD-10-CM

## 2020-05-31 DIAGNOSIS — Z23 Encounter for immunization: Secondary | ICD-10-CM

## 2020-05-31 DIAGNOSIS — Z789 Other specified health status: Secondary | ICD-10-CM | POA: Diagnosis not present

## 2020-05-31 DIAGNOSIS — Z9229 Personal history of other drug therapy: Secondary | ICD-10-CM

## 2020-05-31 DIAGNOSIS — E785 Hyperlipidemia, unspecified: Secondary | ICD-10-CM

## 2020-05-31 DIAGNOSIS — E1169 Type 2 diabetes mellitus with other specified complication: Secondary | ICD-10-CM

## 2020-05-31 DIAGNOSIS — I10 Essential (primary) hypertension: Secondary | ICD-10-CM

## 2020-05-31 DIAGNOSIS — R7989 Other specified abnormal findings of blood chemistry: Secondary | ICD-10-CM

## 2020-05-31 DIAGNOSIS — F3289 Other specified depressive episodes: Secondary | ICD-10-CM

## 2020-05-31 DIAGNOSIS — J309 Allergic rhinitis, unspecified: Secondary | ICD-10-CM

## 2020-05-31 DIAGNOSIS — E1159 Type 2 diabetes mellitus with other circulatory complications: Secondary | ICD-10-CM

## 2020-05-31 DIAGNOSIS — E559 Vitamin D deficiency, unspecified: Secondary | ICD-10-CM

## 2020-05-31 MED ORDER — HYDROCHLOROTHIAZIDE 25 MG PO TABS: 25.0000 mg | ORAL_TABLET | Freq: Every day | ORAL | 1 refills | Status: DC

## 2020-05-31 MED ORDER — MONTELUKAST SODIUM 10 MG PO TABS
10.0000 mg | ORAL_TABLET | Freq: Every evening | ORAL | 1 refills | Status: DC | PRN
Start: 2020-05-31 — End: 2021-06-06
  Filled 2021-01-16: qty 90, 90d supply, fill #0

## 2020-05-31 MED ORDER — AMLODIPINE BESYLATE 10 MG PO TABS: 10.0000 mg | ORAL_TABLET | Freq: Every day | ORAL | 1 refills | Status: DC

## 2020-05-31 MED ORDER — PANTOPRAZOLE SODIUM 40 MG PO TBEC
40.0000 mg | DELAYED_RELEASE_TABLET | Freq: Every day | ORAL | 1 refills | Status: DC
Start: 2020-05-31 — End: 2020-05-31

## 2020-05-31 MED ORDER — METHYLPREDNISOLONE 4 MG PO TABS: ORAL_TABLET | ORAL | 0 refills | Status: DC

## 2020-05-31 MED ORDER — DOXYCYCLINE HYCLATE 100 MG PO TABS: 100.0000 mg | ORAL_TABLET | Freq: Two times a day (BID) | ORAL | 0 refills | Status: DC

## 2020-05-31 MED ORDER — LOSARTAN POTASSIUM 100 MG PO TABS: 100.0000 mg | ORAL_TABLET | Freq: Every day | ORAL | 1 refills | Status: DC

## 2020-05-31 MED FILL — DOXYCYCLINE HYCLATE 100 MG: 100 | 10 days supply | Qty: 20 | Fill #0

## 2020-05-31 MED FILL — PANTOPRAZOLE SOD DR 40 MG T: 40 | 90 days supply | Qty: 90 | Fill #0

## 2020-05-31 MED FILL — AMLODIPINE BESYLATE 10 MG T: 10 | 90 days supply | Qty: 90 | Fill #0

## 2020-05-31 MED FILL — HYDROCHLOROTHIAZIDE 25 MG T: 25 | 90 days supply | Qty: 90 | Fill #0

## 2020-05-31 MED FILL — LOSARTAN POTASSIUM 100 MG T: 100 | 90 days supply | Qty: 90 | Fill #0

## 2020-05-31 MED FILL — MONTELUKAST SOD 10 MG TAB: 10 | 90 days supply | Qty: 90 | Fill #0

## 2020-05-31 MED FILL — METHYLPREDNISOLONE 4 MG TAB: 4 | 5 days supply | Qty: 15 | Fill #0

## 2020-05-31 NOTE — Assessment & Plan Note (Signed)
Supplement and monitor 

## 2020-05-31 NOTE — Assessment & Plan Note (Signed)
Well controlled, no changes to meds. Encouraged heart healthy diet such as the DASH diet and exercise as tolerated.  °

## 2020-05-31 NOTE — Patient Instructions (Addendum)
Shingrix is the new shingles shot, 2 shots over 2-6 months. Call insurance and confirm coverage and document. Then ask if it is cheaper to get at pharmacy or at office. If at office can call f Carbohydrate Counting for Diabetes Mellitus, Adult  Carbohydrate counting is a method of keeping track of how many carbohydrates you eat. Eating carbohydrates naturally increases the amount of sugar (glucose) in the blood. Counting how many carbohydrates you eat helps keep your blood glucose within normal limits, which helps you manage your diabetes (diabetes mellitus). It is important to know how many carbohydrates you can safely have in each meal. This is different for every person. A diet and nutrition specialist (registered dietitian) can help you make a meal plan and calculate how many carbohydrates you should have at each meal and snack. Carbohydrates are found in the following foods:  Grains, such as breads and cereals.  Dried beans and soy products.  Starchy vegetables, such as potatoes, peas, and corn.  Fruit and fruit juices.  Milk and yogurt.  Sweets and snack foods, such as cake, cookies, candy, chips, and soft drinks. How do I count carbohydrates? There are two ways to count carbohydrates in food. You can use either of the methods or a combination of both. Reading "Nutrition Facts" on packaged food The "Nutrition Facts" list is included on the labels of almost all packaged foods and beverages in the [place] It includes:  The serving size.  Information about nutrients in each serving, including the grams (g) of carbohydrate per serving. To use the "Nutrition Facts":  Decide how many servings you will have.  Multiply the number of servings by the number of carbohydrates per serving.  The resulting number is the total amount of carbohydrates that you will be having. Learning standard serving sizes of other foods When you eat carbohydrate foods that are not packaged or do not include  "Nutrition Facts" on the label, you need to measure the servings in order to count the amount of carbohydrates:  Measure the foods that you will eat with a food scale or measuring cup, if needed.  Decide how many standard-size servings you will eat.  Multiply the number of servings by 15. Most carbohydrate-rich foods have about 15 g of carbohydrates per serving. ? For example, if you eat 8 oz (170 g) of strawberries, you will have eaten 2 servings and 30 g of carbohydrates (2 servings x 15 g = 30 g).  For foods that have more than one food mixed, such as soups and casseroles, you must count the carbohydrates in each food that is included. The following list contains standard serving sizes of common carbohydrate-rich foods. Each of these servings has about 15 g of carbohydrates:   hamburger bun or  English muffin.   oz (15 mL) syrup.   oz (14 g) jelly.  1 slice of bread.  1 six-inch tortilla.  3 oz (85 g) cooked rice or pasta.  4 oz (113 g) cooked dried beans.  4 oz (113 g) starchy vegetable, such as peas, corn, or potatoes.  4 oz (113 g) hot cereal.  4 oz (113 g) mashed potatoes or  of a large baked potato.  4 oz (113 g) canned or frozen fruit.  4 oz (120 mL) fruit juice.  4-6 crackers.  6 chicken nuggets.  6 oz (170 g) unsweetened dry cereal.  6 oz (170 g) plain fat-free yogurt or yogurt sweetened with artificial sweeteners.  8 oz (240 mL) milk.  8 oz (170 g) fresh fruit or one small piece of fruit.  24 oz (680 g) popped popcorn. Example of carbohydrate counting Sample meal  3 oz (85 g) chicken breast.  6 oz (170 g) brown rice.  4 oz (113 g) corn.  8 oz (240 mL) milk.  8 oz (170 g) strawberries with sugar-free whipped topping. Carbohydrate calculation 1. Identify the foods that contain carbohydrates: ? Rice. ? Corn. ? Milk. ? Strawberries. 2. Calculate how many servings you have of each food: ? 2 servings rice. ? 1 serving corn. ? 1 serving  milk. ? 1 serving strawberries. 3. Multiply each number of servings by 15 g: ? 2 servings rice x 15 g = 30 g. ? 1 serving corn x 15 g = 15 g. ? 1 serving milk x 15 g = 15 g. ? 1 serving strawberries x 15 g = 15 g. 4. Add together all of the amounts to find the total grams of carbohydrates eaten: ? 30 g + 15 g + 15 g + 15 g = 75 g of carbohydrates total. Summary  Carbohydrate counting is a method of keeping track of how many carbohydrates you eat.  Eating carbohydrates naturally increases the amount of sugar (glucose) in the blood.  Counting how many carbohydrates you eat helps keep your blood glucose within normal limits, which helps you manage your diabetes.  A diet and nutrition specialist (registered dietitian) can help you make a meal plan and calculate how many carbohydrates you should have at each meal and snack. This information is not intended to replace advice given to you by your health care provider. Make sure you discuss any questions you have with your health care provider. Document Revised: 03/14/2017 Document Reviewed: 02/01/2016 Elsevier Patient Education  2020 [employer]. or nurse visit to get this done

## 2020-05-31 NOTE — Assessment & Plan Note (Signed)
hgba1c acceptable, minimize simple carbs. Increase exercise as tolerated. Continue current meds 

## 2020-05-31 NOTE — Assessment & Plan Note (Signed)
Encouraged heart healthy diet, increase exercise, avoid trans fats, consider a krill oil cap daily 

## 2020-06-01 DIAGNOSIS — R7989 Other specified abnormal findings of blood chemistry: Secondary | ICD-10-CM | POA: Insufficient documentation

## 2020-06-01 NOTE — Progress Notes (Signed)
Subjective:    Patient ID: Brandi Lester, female    DOB: 1966-04-13, 54 y.o.   MRN: [id number]  Chief Complaint  Patient presents with  . Follow-up    allergy concern    HPI Patient is in today for follow up on chronic medical concerns. She denies any recent febrile illness or hospitalizations. She is struggling with increased congestion since stopping her allergy shots. Denies CP/palp/SOB/HA/fevers/GI or GU c/o. Taking meds as prescribed. She is back in school and managing the stress well. Is using nyquil for her cough at night and doing well.   Past Medical History:  Diagnosis Date  . ADD (attention deficit disorder)   . Allergy    allergic rhinitis  . Anemia 07/25/2013  . Angio-edema   . Anxiety   . Arthritis    not dx'd  . Back pain   . Costochondritis 02/20/2015  . Diabetes mellitus    Type II   previously 3 years ago - no longer on meds   . Dyslipidemia 07/25/2013  . Fungus infection 10/13   Left great toe  . GERD (gastroesophageal reflux disease)   . Hypertension   . IBS (irritable bowel syndrome)   . Ingrown nail 10/13   right foot next to the last toe  . Insomnia 04/20/2013  . Joint pain   . Lactose intolerance   . Leg edema   . Obesity   . Pedal edema 02/20/2015  . Prediabetes   . Preventative health care 09/25/2015  . PVC (premature ventricular contraction)     Past Surgical History:  Procedure Laterality Date  . HYSTEROSCOPY N/A 10/04/2015   Procedure: HYSTEROSCOPY with Removal IUD;  Surgeon: Vanessa Kick, MD;  Location: [facility];  Service: Gynecology;  Laterality: N/A;  . lasik     eye surgery  . WISDOM TOOTH EXTRACTION      Family History  Problem Relation Age of Onset  . Heart attack Mother 3  . Hypertension Mother   . Heart disease Mother   . Obesity Mother   . Cancer Father        prostate  . Prostate cancer Father   . Heart attack Sister 8  . Hypertension Other   . Colon cancer Neg Hx   . Colon polyps Neg Hx   . Esophageal  cancer Neg Hx   . Stomach cancer Neg Hx   . Rectal cancer Neg Hx     Social History   Socioeconomic History  . Marital status: Single    Spouse name: Not on file  . Number of children: Not on file  . Years of education: Not on file  . Highest education level: Not on file  Occupational History  . Occupation: Pharmacist, hospital, [employer]  Tobacco Use  . Smoking status: Former Smoker    Packs/day: 1.00    Years: 15.00    Pack years: 15.00    Quit date: 09/03/2001    Years since quitting: 18.7  . Smokeless tobacco: Never Used  Vaping Use  . Vaping Use: Never used  Substance and Sexual Activity  . Alcohol use: Yes    Comment: rare  . Drug use: No  . Sexual activity: Yes    Birth control/protection: None  Other Topics Concern  . Not on file  Social History Narrative  . Not on file   Social Determinants of Health   Financial Resource Strain:   . Difficulty of Paying Living Expenses: Not on file  Food Insecurity:   .  Worried About Charity fundraiser in the Last Year: Not on file  . Ran Out of Food in the Last Year: Not on file  Transportation Needs:   . Lack of Transportation (Medical): Not on file  . Lack of Transportation (Non-Medical): Not on file  Physical Activity:   . Days of Exercise per Week: Not on file  . Minutes of Exercise per Session: Not on file  Stress:   . Feeling of Stress : Not on file  Social Connections:   . Frequency of Communication with Friends and Family: Not on file  . Frequency of Social Gatherings with Friends and Family: Not on file  . Attends Religious Services: Not on file  . Active Member of Clubs or Organizations: Not on file  . Attends Archivist Meetings: Not on file  . Marital Status: Not on file  Intimate Partner Violence:   . Fear of Current or Ex-Partner: Not on file  . Emotionally Abused: Not on file  . Physically Abused: Not on file  . Sexually Abused: Not on file    Outpatient Medications Prior to Visit  Medication  Sig Dispense Refill  . acetaminophen (TYLENOL 8 HOUR) 650 MG CR tablet Take 1 tablet (650 mg total) every 8 (eight) hours as needed by mouth for pain.    [name] atorvastatin (LIPITOR) 10 MG tablet TAKE 1/2 TABLET (5 MG TOTAL) BY MOUTH DAILY. 45 tablet 1  . azelastine (ASTELIN) 0.1 % nasal spray Place 2 sprays into both nostrils 2 (two) times daily. Use in each nostril as directed (Patient taking differently: Place 2 sprays into both nostrils as needed. Use in each nostril as directed ) 30 mL 2  . buPROPion (WELLBUTRIN SR) 150 MG 12 hr tablet Take 1 tablet (150 mg total) by mouth in the morning. 30 tablet 0  . cholecalciferol (VITAMIN D3) 25 MCG (1000 UT) tablet Take 4,000 Units by mouth daily.    . cyclobenzaprine (FLEXERIL) 5 MG tablet Take 1 tablet (5 mg total) by mouth 3 (three) times daily as needed for muscle spasms. (Patient not taking: Reported on 05/23/2020) 30 tablet 1  . EPINEPHrine (EPIPEN 2-PAK) 0.3 mg/0.3 mL IJ SOAJ injection Use as directed for severe allergic reaction (Patient not taking: Reported on 05/23/2020) 2 each 1  . fluticasone (FLONASE) 50 MCG/ACT nasal spray Place 2 sprays into both nostrils daily as needed for allergies. 16 g 2  . ibuprofen (ADVIL,MOTRIN) 600 MG tablet Take 1 tablet (600 mg total) by mouth every 6 (six) hours as needed. (Patient not taking: Reported on 05/23/2020) 90 tablet 0  . medroxyPROGESTERone (PROVERA) 10 MG tablet Take 1 tablet (10 mg total) by mouth daily. 90 tablet 3  . metFORMIN (GLUCOPHAGE) 500 MG tablet Take 1 tablet (500 mg total) by mouth 2 (two) times daily with a meal. 180 tablet 0  . metoprolol succinate (TOPROL-XL) 50 MG 24 hr tablet Take 1 tablet (50 mg total) by mouth daily. Take with or immediately following a meal. 90 tablet 0  . Omega-3 Fatty Acids (FISH OIL PO) Take 1 capsule by mouth daily.    . potassium chloride SA (KLOR-CON) 20 MEQ tablet Take 1 tablet (20 mEq total) by mouth 3 (three) times daily. 270 tablet 1  . Probiotic Product  (PROBIOTIC-10 PO) Take by mouth as needed.  (Patient not taking: Reported on 05/23/2020)    . Sodium Sulfate-Mag Sulfate-KCl (SUTAB) [phone] MG TABS Take 24 tablets by mouth as directed. MANUFACTURER CODESKara Dies: [id number] PCN:  CN GROUP: [id number] MEMBER ID: [id number];VXB AS CASH;NO PRIOR AUTHORIZATION 24 tablet 0  . TURMERIC PO Take 1 capsule by mouth daily.    [name] amLODipine (NORVASC) 10 MG tablet Take 1 tablet (10 mg total) by mouth daily. 90 tablet 0  . fexofenadine (ALLEGRA ALLERGY) 180 MG tablet Take 1 tablet (180 mg total) by mouth daily. (Patient not taking: Reported on 05/23/2020) 301 tablet 1  . hydrochlorothiazide (HYDRODIURIL) 25 MG tablet Take 1 tablet (25 mg total) by mouth daily. 90 tablet 0  . losartan (COZAAR) 100 MG tablet Take 1 tablet (100 mg total) by mouth at bedtime. 90 tablet 0  . montelukast (SINGULAIR) 10 MG tablet Take 1 tablet (10 mg total) by mouth at bedtime as needed. 90 tablet 1  . pantoprazole (PROTONIX) 40 MG tablet TAKE 1 TABLET BY MOUTH ONCE DAILY 90 tablet 1   No facility-administered medications prior to visit.    Allergies  Allergen Reactions  . Penicillins Itching and Swelling    Has patient had a PCN reaction causing immediate rash, facial/tongue/throat swelling, SOB or lightheadedness with hypotension: yes Has patient had a PCN reaction causing severe rash involving mucus membranes or skin necrosis: no Has patient had a PCN reaction that required hospitalization no Has patient had a PCN reaction occurring within the last 10 years: yes If all of the above answers are "NO", then may proceed with Cephalosporin use.   [name] [Liraglutide]     Vomiting    Review of Systems  Constitutional: Negative for fever and malaise/fatigue.  HENT: Positive for congestion.   Eyes: Negative for blurred vision.  Respiratory: Positive for cough. Negative for shortness of breath.   Cardiovascular: Negative for chest pain, palpitations and leg swelling.    Gastrointestinal: Negative for abdominal pain, blood in stool and nausea.  Genitourinary: Negative for dysuria and frequency.  Musculoskeletal: Negative for falls.  Skin: Negative for rash.  Neurological: Negative for dizziness, loss of consciousness and headaches.  Endo/Heme/Allergies: Negative for environmental allergies.  Psychiatric/Behavioral: Negative for depression. The patient is not nervous/anxious.        Objective:    Physical Exam Vitals and nursing note reviewed.  Constitutional:      General: She is not in acute distress.    Appearance: She is well-developed.  HENT:     Head: Normocephalic and atraumatic.     Nose: Nose normal.  Eyes:     General:        Right eye: No discharge.        Left eye: No discharge.  Cardiovascular:     Rate and Rhythm: Normal rate and regular rhythm.     Heart sounds: No murmur heard.   Pulmonary:     Effort: Pulmonary effort is normal.     Breath sounds: Normal breath sounds.  Abdominal:     General: Bowel sounds are normal.     Palpations: Abdomen is soft.     Tenderness: There is no abdominal tenderness.  Musculoskeletal:     Cervical back: Normal range of motion and neck supple.  Skin:    General: Skin is warm and dry.  Neurological:     Mental Status: She is alert and oriented to person, place, and time.     BP 122/78 (BP Location: Right Arm, Patient Position: Sitting, Cuff Size: Large)   Pulse 90   Temp 98.7 F (37.1 C)   Wt (!) 320 lb 12.8 oz (145.5 kg)   LMP 05/21/2020 (Exact Date)  SpO2 98%   BMI 46.03 kg/m  Wt Readings from Last 3 Encounters:  05/31/20 (!) 320 lb 12.8 oz (145.5 kg)  05/23/20 (!) 323 lb (146.5 kg)  05/18/20 (!) 323 lb (146.5 kg)    Diabetic Foot Exam - Simple   No data filed     Lab Results  Component Value Date   WBC 7.9 03/21/2020   HGB 12.2 03/21/2020   HCT 36.4 03/21/2020   PLT 281.0 03/21/2020   GLUCOSE 97 03/21/2020   CHOL 106 03/21/2020   TRIG 65.0 03/21/2020   HDL  33.00 (L) 03/21/2020   LDLCALC 60 03/21/2020   ALT 10 03/21/2020   AST 8 03/21/2020   NA 138 03/21/2020   K 3.9 03/21/2020   CL 102 03/21/2020   CREATININE 0.77 03/21/2020   BUN 14 03/21/2020   CO2 29 03/21/2020   TSH 1.76 03/21/2020   HGBA1C 6.1 03/21/2020   MICROALBUR 0.50 12/15/2012    Lab Results  Component Value Date   TSH 1.76 03/21/2020   Lab Results  Component Value Date   WBC 7.9 03/21/2020   HGB 12.2 03/21/2020   HCT 36.4 03/21/2020   MCV 84.5 03/21/2020   PLT 281.0 03/21/2020   Lab Results  Component Value Date   NA 138 03/21/2020   K 3.9 03/21/2020   CO2 29 03/21/2020   GLUCOSE 97 03/21/2020   BUN 14 03/21/2020   CREATININE 0.77 03/21/2020   BILITOT 0.3 03/21/2020   ALKPHOS 68 03/21/2020   AST 8 03/21/2020   ALT 10 03/21/2020   PROT 6.6 03/21/2020   ALBUMIN 4.0 03/21/2020   CALCIUM 9.1 03/21/2020   GFR 95.31 03/21/2020   Lab Results  Component Value Date   CHOL 106 03/21/2020   Lab Results  Component Value Date   HDL 33.00 (L) 03/21/2020   Lab Results  Component Value Date   LDLCALC 60 03/21/2020   Lab Results  Component Value Date   TRIG 65.0 03/21/2020   Lab Results  Component Value Date   CHOLHDL 3 03/21/2020   Lab Results  Component Value Date   HGBA1C 6.1 03/21/2020       Assessment & Plan:   Problem List Items Addressed This Visit    Hypertension associated with type 2 diabetes mellitus ([place])    Well controlled, no changes to meds. Encouraged heart healthy diet such as the DASH diet and exercise as tolerated.       Relevant Medications   losartan (COZAAR) 100 MG tablet   hydrochlorothiazide (HYDRODIURIL) 25 MG tablet   amLODipine (NORVASC) 10 MG tablet   Other Relevant Orders   CBC   Comprehensive metabolic panel   TSH   Allergic rhinitis   Diabetes mellitus type 2 in obese (HCC)    hgba1c acceptable, minimize simple carbs. Increase exercise as tolerated. Continue current meds      Relevant Medications    losartan (COZAAR) 100 MG tablet   Other Relevant Orders   Hemoglobin A1c   Insulin, random   Dyslipidemia    Encouraged heart healthy diet, increase exercise, avoid trans fats, consider a krill oil cap daily      Relevant Orders   Lipid panel   Vitamin D deficiency    Supplement and monitor      Relevant Orders   VITAMIN D 25 Hydroxy (Vit-D Deficiency, Fractures)   Depression    Doing well on Wellbutrin no changes      High serum vitamin B12  Not taking a supplement, no changes      Relevant Orders   Vitamin B12    Other Visit Diagnoses    Tetanus toxoid vaccination administered greater than 10 years ago    -  Primary   Relevant Orders   Tdap vaccine greater than or equal to 7yo IM (Completed)   Influenza vaccine administered       Relevant Orders   Flu Vaccine QUAD 36+ mos IM (Fluarix & Fluzone Quad PF (Completed)      I have discontinued April T. Pherigo "[name] Placido"'s fexofenadine. I have also changed her pantoprazole. Additionally, I am having her start on methylPREDNISolone and doxycycline. Lastly, I am having her maintain her TURMERIC PO, Omega-3 Fatty Acids (FISH OIL PO), ibuprofen, Probiotic Product (PROBIOTIC-10 PO), acetaminophen, EPINEPHrine, cholecalciferol, cyclobenzaprine, atorvastatin, medroxyPROGESTERone, potassium chloride SA, azelastine, fluticasone, Sutab, buPROPion, metFORMIN, metoprolol succinate, losartan, hydrochlorothiazide, amLODipine, and montelukast.  Meds ordered this encounter  Medications  . methylPREDNISolone (MEDROL) 4 MG tablet    Sig: 5 tab po qd X 1d then 4 tab po qd X 1d then 3 tab po qd X 1d then 2 tab po qd then 1 tab po qd    Dispense:  15 tablet    Refill:  0  . doxycycline (VIBRA-TABS) 100 MG tablet    Sig: Take 1 tablet (100 mg total) by mouth 2 (two) times daily.    Dispense:  20 tablet    Refill:  0  . pantoprazole (PROTONIX) 40 MG tablet    Sig: Take 1 tablet (40 mg total) by mouth daily.    Dispense:  90 tablet     Refill:  1  . losartan (COZAAR) 100 MG tablet    Sig: Take 1 tablet (100 mg total) by mouth at bedtime.    Dispense:  90 tablet    Refill:  1    Requested drug refills are authorized, however, the patient needs further evaluation and/or laboratory testing before further refills are given. Ask her to make an appointment for this.  . hydrochlorothiazide (HYDRODIURIL) 25 MG tablet    Sig: Take 1 tablet (25 mg total) by mouth daily.    Dispense:  90 tablet    Refill:  1    Requested drug refills are authorized, however, the patient needs further evaluation and/or laboratory testing before further refills are given. Ask her to make an appointment for this.  [name] amLODipine (NORVASC) 10 MG tablet    Sig: Take 1 tablet (10 mg total) by mouth daily.    Dispense:  90 tablet    Refill:  1    Requested drug refills are authorized, however, the patient needs further evaluation and/or laboratory testing before further refills are given. Ask her to make an appointment for this.  . montelukast (SINGULAIR) 10 MG tablet    Sig: Take 1 tablet (10 mg total) by mouth at bedtime as needed.    Dispense:  90 tablet    Refill:  1     Penni Homans, MD

## 2020-06-01 NOTE — Assessment & Plan Note (Signed)
Not taking a supplement, no changes

## 2020-06-01 NOTE — Assessment & Plan Note (Signed)
Doing well on Wellbutrin no changes

## 2020-06-08 ENCOUNTER — Ambulatory Visit (INDEPENDENT_AMBULATORY_CARE_PROVIDER_SITE_OTHER): Payer: BC Managed Care – PPO | Admitting: Family Medicine

## 2020-06-08 ENCOUNTER — Encounter (INDEPENDENT_AMBULATORY_CARE_PROVIDER_SITE_OTHER): Payer: Self-pay | Admitting: Family Medicine

## 2020-06-08 ENCOUNTER — Other Ambulatory Visit (INDEPENDENT_AMBULATORY_CARE_PROVIDER_SITE_OTHER): Payer: Self-pay | Admitting: Family Medicine

## 2020-06-08 ENCOUNTER — Other Ambulatory Visit: Payer: Self-pay

## 2020-06-08 VITALS — BP 128/82 | HR 89 | Temp 99.1°F | Ht 70.0 in | Wt 317.0 lb

## 2020-06-08 DIAGNOSIS — F3289 Other specified depressive episodes: Secondary | ICD-10-CM | POA: Diagnosis not present

## 2020-06-08 DIAGNOSIS — I152 Hypertension secondary to endocrine disorders: Secondary | ICD-10-CM | POA: Diagnosis not present

## 2020-06-08 DIAGNOSIS — E1159 Type 2 diabetes mellitus with other circulatory complications: Secondary | ICD-10-CM

## 2020-06-08 DIAGNOSIS — Z6841 Body Mass Index (BMI) 40.0 and over, adult: Secondary | ICD-10-CM

## 2020-06-08 MED ORDER — METOPROLOL SUCCINATE ER 50 MG PO TB24: 50.0000 mg | ORAL_TABLET | Freq: Every day | ORAL | 0 refills | Status: DC

## 2020-06-08 MED ORDER — BUPROPION HCL ER (SR) 150 MG PO TB12: 150.0000 mg | ORAL_TABLET | Freq: Every morning | ORAL | 0 refills | Status: DC

## 2020-06-09 ENCOUNTER — Encounter (INDEPENDENT_AMBULATORY_CARE_PROVIDER_SITE_OTHER): Payer: Self-pay | Admitting: Family Medicine

## 2020-06-09 NOTE — Progress Notes (Signed)
Chief Complaint:   OBESITY Brandi Lester is here to discuss her progress with her obesity treatment plan along with follow-up of her obesity related diagnoses. Brandi Lester is on the Category 4 Plan and states she is following her eating plan approximately 80% of the time. Brandi Lester states she is walking or on the exercise bike for 30 minutes 4 times per week.  Today's visit was #: 24 Starting weight: 325 lbs Starting date: 05/23/2017 Today's weight: 317 lbs Today's date: 06/08/2020 Total lbs lost to date: 8 Total lbs lost since last in-office visit: 6  Interim History: Brandi Lester has been doing more meal prep using prepared pulled chicken from [employer] and is eating out less. She is doing a great job with exercise. She would like to get the motivation back that she had when she first started with our clinic.  Subjective:   1. Hypertension associated with type 2 diabetes mellitus ([place]) [name]'s hypertension is well controlled with Norvasc, hydrochlorothiazide, losartan, and metoprolol.   BP Readings from Last 3 Encounters:  06/08/20 128/82  05/31/20 122/78  05/23/20 131/76   Lab Results  Component Value Date   CREATININE 0.77 03/21/2020   CREATININE 0.80 12/01/2019   CREATININE 0.60 07/07/2019   2. Other depression, with emotional eating Brandi Lester's cravings are well controlled with bupropion. She denies insomnia.  Assessment/Plan:   1. Hypertension associated with type 2 diabetes mellitus ([place]) . We will refill metoprolol for 90 days with no refill.  - metoprolol succinate (TOPROL-XL) 50 MG 24 hr tablet; Take 1 tablet (50 mg total) by mouth daily. Take with or immediately following a meal.  Dispense: 90 tablet; Refill: 0  2. Other depression, with emotional eating Refill bupropion for 90 days with no refill. Orders and follow up as documented in patient record.   - buPROPion (WELLBUTRIN SR) 150 MG 12 hr tablet; Take 1 tablet (150 mg total) by mouth in the morning.  Dispense: 90  tablet; Refill: 0  3. Class 3 severe obesity with serious comorbidity and body mass index (BMI) of 45.0 to 49.9 in adult, unspecified obesity type (HCC) Brandi Lester is currently in the action stage of change. As such, her goal is to continue with weight loss efforts. She has agreed to the Category 4 Plan.   Handout given today: Recipes.  Exercise goals: As is.  Behavioral modification strategies: increasing lean protein intake and decreasing simple carbohydrates.  [name] has agreed to follow-up with our clinic in 3 weeks.  Objective:   Blood pressure 128/82, pulse 89, temperature 99.1 F (37.3 C), height 5\' 10"  (1.778 m), weight (!) 317 lb (143.8 kg), last menstrual period 05/21/2020, SpO2 99 %. Body mass index is 45.48 kg/m.  General: Cooperative, alert, well developed, in no acute distress. HEENT: Conjunctivae and lids unremarkable. Cardiovascular: Regular rhythm.  Lungs: Normal work of breathing. Neurologic: No focal deficits.   Lab Results  Component Value Date   CREATININE 0.77 03/21/2020   BUN 14 03/21/2020   NA 138 03/21/2020   K 3.9 03/21/2020   CL 102 03/21/2020   CO2 29 03/21/2020   Lab Results  Component Value Date   ALT 10 03/21/2020   AST 8 03/21/2020   ALKPHOS 68 03/21/2020   BILITOT 0.3 03/21/2020   Lab Results  Component Value Date   HGBA1C 6.1 03/21/2020   HGBA1C 5.9 (H) 12/01/2019   HGBA1C 5.9 (H) 07/07/2019   HGBA1C 6.1 (H) 11/20/2018   HGBA1C 5.9 (H) 07/14/2018   Lab Results  Component Value  Date   INSULIN 20.7 12/01/2019   INSULIN 12.9 07/07/2019   INSULIN 21.2 11/20/2018   INSULIN 15.4 07/14/2018   INSULIN 12.9 09/12/2017   Lab Results  Component Value Date   TSH 1.76 03/21/2020   Lab Results  Component Value Date   CHOL 106 03/21/2020   HDL 33.00 (L) 03/21/2020   LDLCALC 60 03/21/2020   TRIG 65.0 03/21/2020   CHOLHDL 3 03/21/2020   Lab Results  Component Value Date   WBC 7.9 03/21/2020   HGB 12.2 03/21/2020   HCT 36.4  03/21/2020   MCV 84.5 03/21/2020   PLT 281.0 03/21/2020   No results found for: IRON, TIBC, FERRITIN  Attestation Statements:   Reviewed by clinician on day of visit: allergies, medications, problem list, medical history, surgical history, family history, social history, and previous encounter notes.   Wilhemena Durie, am acting as Location manager for [employer], FNP-C.  I have reviewed the above documentation for accuracy and completeness, and I agree with the above. -  Georgianne Fick, FNP

## 2020-06-13 MED FILL — BUPROPION HCL ER (SR) 150 M: 150 | 90 days supply | Qty: 90 | Fill #0

## 2020-06-28 MED FILL — POTASSIUM CHLORIDE CRYS ER: 20 | 90 days supply | Qty: 270 | Fill #1

## 2020-06-29 ENCOUNTER — Other Ambulatory Visit: Payer: Self-pay

## 2020-06-29 ENCOUNTER — Other Ambulatory Visit (INDEPENDENT_AMBULATORY_CARE_PROVIDER_SITE_OTHER): Payer: Self-pay | Admitting: Family Medicine

## 2020-06-29 ENCOUNTER — Ambulatory Visit (INDEPENDENT_AMBULATORY_CARE_PROVIDER_SITE_OTHER): Payer: BC Managed Care – PPO | Admitting: Family Medicine

## 2020-06-29 ENCOUNTER — Encounter (INDEPENDENT_AMBULATORY_CARE_PROVIDER_SITE_OTHER): Payer: Self-pay | Admitting: Family Medicine

## 2020-06-29 VITALS — BP 135/85 | HR 87 | Temp 98.5°F | Ht 70.0 in | Wt 321.0 lb

## 2020-06-29 DIAGNOSIS — E1169 Type 2 diabetes mellitus with other specified complication: Secondary | ICD-10-CM | POA: Diagnosis not present

## 2020-06-29 DIAGNOSIS — Z6841 Body Mass Index (BMI) 40.0 and over, adult: Secondary | ICD-10-CM

## 2020-06-29 DIAGNOSIS — I152 Hypertension secondary to endocrine disorders: Secondary | ICD-10-CM | POA: Diagnosis not present

## 2020-06-29 DIAGNOSIS — I10 Essential (primary) hypertension: Secondary | ICD-10-CM

## 2020-06-29 DIAGNOSIS — E1159 Type 2 diabetes mellitus with other circulatory complications: Secondary | ICD-10-CM

## 2020-06-29 DIAGNOSIS — Z9189 Other specified personal risk factors, not elsewhere classified: Secondary | ICD-10-CM | POA: Diagnosis not present

## 2020-06-29 MED ORDER — OZEMPIC (0.25 OR 0.5 MG/DOSE) 2 MG/1.5ML ~~LOC~~ SOPN: 0.2500 mg | PEN_INJECTOR | SUBCUTANEOUS | 0 refills | Status: DC

## 2020-06-29 MED ORDER — ONDANSETRON 8 MG PO TBDP: 8.0000 mg | ORAL_TABLET | Freq: Three times a day (TID) | ORAL | 0 refills | Status: DC | PRN

## 2020-06-29 MED FILL — OZEMPIC 0.25 OR 0.5 MG/DOSE: 2 | 56 days supply | Qty: 2 | Fill #0

## 2020-06-29 MED FILL — ONDANSETRON ODT 8 MG TABLET: 8 | 21 days supply | Qty: 18 | Fill #0

## 2020-06-30 ENCOUNTER — Encounter (INDEPENDENT_AMBULATORY_CARE_PROVIDER_SITE_OTHER): Payer: Self-pay | Admitting: Family Medicine

## 2020-06-30 NOTE — Progress Notes (Signed)
Chief Complaint:   OBESITY Brandi Lester is here to discuss her progress with her obesity treatment plan along with follow-up of her obesity related diagnoses. Brandi Lester is on the Category 4 Plan and states she is following her eating plan approximately 70% of the time. Brandi Lester states she is walking and bike riding for 45 minutes-1 hour 4-5 times per week.  Today's visit was #: 45 Starting weight: 325 lbs Starting date: 05/23/2017 Today's weight: 321 lbs Today's date: 06/29/2020 Total lbs lost to date: 4 Total lbs lost since last in-office visit: 0  Interim History: Brandi Lester notes being off the plan due to eating at a wedding. 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  She has continued to do meal planning for supper.  Subjective:   1. Type 2 diabetes mellitus with other specified complication, without long-term current use of insulin (HCC) Brandi Lester's diabetes mellitus is well controlled on metformin. She had nausea in the past  When she was on Victoza and the dose was escalated.  We discussed starting Ozempic and then transitioning to [facility] if Ozempic is tolerated.  Lab Results  Component Value Date   HGBA1C 6.1 03/21/2020   HGBA1C 5.9 (H) 12/01/2019   HGBA1C 5.9 (H) 07/07/2019   Lab Results  Component Value Date   MICROALBUR 0.50 12/15/2012   LDLCALC 60 03/21/2020   CREATININE 0.77 03/21/2020   Lab Results  Component Value Date   INSULIN 20.7 12/01/2019   INSULIN 12.9 07/07/2019   INSULIN 21.2 11/20/2018   INSULIN 15.4 07/14/2018   INSULIN 12.9 09/12/2017   2. Essential hypertension Brandi Lester's blood pressure is well controlled on Norvasc, hydrochlorothiazide, and metoprolol. Cardiovascular ROS: no chest pain or dyspnea on exertion.  BP Readings from Last 3 Encounters:  06/29/20 135/85  06/08/20 128/82  05/31/20 122/78   Lab Results  Component Value Date   CREATININE 0.77 03/21/2020   CREATININE 0.80 12/01/2019   CREATININE 0.60  07/07/2019   3. At risk for side effect of medication Jadene is at risk of drug side effects due to starting Ozempic.  Assessment/Plan:   1. Type 2 diabetes mellitus with other specified complication, without long-term current use of insulin ([place])  Mallery agreed to start Ozempic 0.25 mg weekly with no refills. I prescribed Zofran 8 mg ODT every 8 hours as needed for nausea with no refills.  - Semaglutide,0.25 or 0.5MG /DOS, (OZEMPIC, 0.25 OR 0.5 MG/DOSE,) 2 MG/1.5ML SOPN; Inject 0.25 mg into the skin once a week.  Dispense: 1.5 mL; Refill: 0 - ondansetron (ZOFRAN ODT) 8 MG disintegrating tablet; Take 1 tablet (8 mg total) by mouth every 8 (eight) hours as needed for nausea or vomiting.  Dispense: 20 tablet; Refill: 0  2. Essential hypertension Brandi Lester will continue all her medications.  3. At risk for side effect of medication Brandi Lester was given approximately 15 minutes of drug side effect counseling today.  She has stopped Victoza in the past due to nausea and Ozempic is also a GLP-1 agonist. We discussed side effect possibility and risk versus benefits. Brandi Lester agreed to the medication and will contact this office if these side effects are intolerable.  Repetitive spaced learning was employed today to elicit superior memory formation and behavioral change.  4. Class 3 severe obesity with serious comorbidity and body mass index (BMI) of 45.0 to 49.9 in adult, unspecified obesity type (HCC) Brandi Lester is currently in the action stage of change. As such, her  goal is to continue with weight loss efforts. She has agreed to the Category 4 Plan.   We discussed planning for special occasional and trying to adhere well to the plan on all other days.  Exercise goals: As is.  Behavioral modification strategies: decreasing simple carbohydrates.  Brandi Lester has agreed to follow-up with our clinic in 3 weeks.   Objective:   Blood pressure 135/85, pulse 87, temperature 98.5 F (36.9 C), height 5'  10" (1.778 m), weight (!) 321 lb (145.6 kg), SpO2 98 %. Body mass index is 46.06 kg/m.  General: Cooperative, alert, well developed, in no acute distress. HEENT: Conjunctivae and lids unremarkable. Cardiovascular: Regular rhythm.  Lungs: Normal work of breathing. Neurologic: No focal deficits.   Lab Results  Component Value Date   CREATININE 0.77 03/21/2020   BUN 14 03/21/2020   NA 138 03/21/2020   K 3.9 03/21/2020   CL 102 03/21/2020   CO2 29 03/21/2020   Lab Results  Component Value Date   ALT 10 03/21/2020   AST 8 03/21/2020   ALKPHOS 68 03/21/2020   BILITOT 0.3 03/21/2020   Lab Results  Component Value Date   HGBA1C 6.1 03/21/2020   HGBA1C 5.9 (H) 12/01/2019   HGBA1C 5.9 (H) 07/07/2019   HGBA1C 6.1 (H) 11/20/2018   HGBA1C 5.9 (H) 07/14/2018   Lab Results  Component Value Date   INSULIN 20.7 12/01/2019   INSULIN 12.9 07/07/2019   INSULIN 21.2 11/20/2018   INSULIN 15.4 07/14/2018   INSULIN 12.9 09/12/2017   Lab Results  Component Value Date   TSH 1.76 03/21/2020   Lab Results  Component Value Date   CHOL 106 03/21/2020   HDL 33.00 (L) 03/21/2020   LDLCALC 60 03/21/2020   TRIG 65.0 03/21/2020   CHOLHDL 3 03/21/2020   Lab Results  Component Value Date   WBC 7.9 03/21/2020   HGB 12.2 03/21/2020   HCT 36.4 03/21/2020   MCV 84.5 03/21/2020   PLT 281.0 03/21/2020   No results found for: IRON, TIBC, FERRITIN  Attestation Statements:   Reviewed by clinician on day of visit: allergies, medications, problem list, medical history, surgical history, family history, social history, and previous encounter notes.   Wilhemena Durie, am acting as Location manager for [employer], FNP-C.  I have reviewed the above documentation for accuracy and completeness, and I agree with the above. -  Georgianne Fick, FNP

## 2020-07-18 MED FILL — MEDROXYPROGESTERONE 10 MG T: 10 | 90 days supply | Qty: 90 | Fill #3

## 2020-07-20 ENCOUNTER — Other Ambulatory Visit: Payer: Self-pay

## 2020-07-20 ENCOUNTER — Encounter (INDEPENDENT_AMBULATORY_CARE_PROVIDER_SITE_OTHER): Payer: Self-pay | Admitting: Family Medicine

## 2020-07-20 ENCOUNTER — Ambulatory Visit (INDEPENDENT_AMBULATORY_CARE_PROVIDER_SITE_OTHER): Payer: BC Managed Care – PPO | Admitting: Family Medicine

## 2020-07-20 VITALS — BP 128/77 | HR 85 | Temp 98.1°F | Ht 70.0 in | Wt 318.0 lb

## 2020-07-20 DIAGNOSIS — E1169 Type 2 diabetes mellitus with other specified complication: Secondary | ICD-10-CM

## 2020-07-20 DIAGNOSIS — Z6841 Body Mass Index (BMI) 40.0 and over, adult: Secondary | ICD-10-CM | POA: Diagnosis not present

## 2020-07-21 NOTE — Progress Notes (Addendum)
Chief Complaint:   OBESITY Brandi Lester is here to discuss her progress with her obesity treatment plan along with follow-up of her obesity related diagnoses. Brandi Lester is on the Category 4 Plan and states she is following her eating plan approximately 75% of the time. Brandi Lester states she is doing cardio for 45 minutes 3-4 times per week.  Today's visit was #: 43 Starting weight: 325 lbs Starting date: 05/23/2017 Today's weight: 318 lbs Today's date: 07/20/2020 Total lbs lost to date: 7 Total lbs lost since last in-office visit: 3  Interim History: Brandi Lester has not been getting all of her protein in due to the start of Ozempic. She is skipping some meals. She has not been snacking at all.  Subjective:   1. Type 2 diabetes mellitus with other specified complication, without long-term current use of insulin ([place]) Brandi Lester notices decreased appetite with Ozempic. She tolerates 12.5 mg of Ozempic weekly. She has had an issue with nausea with GLP1 agonists in the past. Her primary care physician is to do lab work in January.  Lab Results  Component Value Date   HGBA1C 6.1 03/21/2020   HGBA1C 5.9 (H) 12/01/2019   HGBA1C 5.9 (H) 07/07/2019   Lab Results  Component Value Date   MICROALBUR 0.50 12/15/2012   LDLCALC 60 03/21/2020   CREATININE 0.77 03/21/2020   Lab Results  Component Value Date   INSULIN 20.7 12/01/2019   INSULIN 12.9 07/07/2019   INSULIN 21.2 11/20/2018   INSULIN 15.4 07/14/2018   INSULIN 12.9 09/12/2017   Assessment/Plan:   1. Type 2 diabetes mellitus with other specified complication, without long-term current use of insulin (HCC)  Brandi Lester will continue 0.125 mg of Ozempic weekly, and she will continue metformin.  2. Class 3 severe obesity with serious comorbidity and body mass index (BMI) of 45.0 to 49.9 in adult, unspecified obesity type (HCC) Brandi Lester is currently in the action stage of change. As such, her goal is to continue with weight loss efforts. She has  agreed to the Category 4 Plan.   Brandi Lester may have 6 oz of meat at supper.  Exercise goals: As is.  Behavioral modification strategies: increasing lean protein intake and no skipping meals.  Brandi Lester has agreed to follow-up with our clinic in 3 weeks.   Objective:   Blood pressure 128/77, pulse 85, temperature 98.1 F (36.7 C), height 5\' 10"  (1.778 m), weight (!) 318 lb (144.2 kg), SpO2 100 %. Body mass index is 45.63 kg/m.  General: Cooperative, alert, well developed, in no acute distress. HEENT: Conjunctivae and lids unremarkable. Cardiovascular: Regular rhythm.  Lungs: Normal work of breathing. Neurologic: No focal deficits.   Lab Results  Component Value Date   CREATININE 0.77 03/21/2020   BUN 14 03/21/2020   NA 138 03/21/2020   K 3.9 03/21/2020   CL 102 03/21/2020   CO2 29 03/21/2020   Lab Results  Component Value Date   ALT 10 03/21/2020   AST 8 03/21/2020   ALKPHOS 68 03/21/2020   BILITOT 0.3 03/21/2020   Lab Results  Component Value Date   HGBA1C 6.1 03/21/2020   HGBA1C 5.9 (H) 12/01/2019   HGBA1C 5.9 (H) 07/07/2019   HGBA1C 6.1 (H) 11/20/2018   HGBA1C 5.9 (H) 07/14/2018   Lab Results  Component Value Date   INSULIN 20.7 12/01/2019   INSULIN 12.9 07/07/2019   INSULIN 21.2 11/20/2018   INSULIN 15.4 07/14/2018   INSULIN 12.9 09/12/2017   Lab Results  Component Value Date  TSH 1.76 03/21/2020   Lab Results  Component Value Date   CHOL 106 03/21/2020   HDL 33.00 (L) 03/21/2020   LDLCALC 60 03/21/2020   TRIG 65.0 03/21/2020   CHOLHDL 3 03/21/2020   Lab Results  Component Value Date   WBC 7.9 03/21/2020   HGB 12.2 03/21/2020   HCT 36.4 03/21/2020   MCV 84.5 03/21/2020   PLT 281.0 03/21/2020   No results found for: IRON, TIBC, FERRITIN  Attestation Statements:   Reviewed by clinician on day of visit: allergies, medications, problem list, medical history, surgical history, family history, social history, and previous encounter  notes.   Wilhemena Durie, am acting as Location manager for [employer], FNP-C.  I have reviewed the above documentation for accuracy and completeness, and I agree with the above. -  Georgianne Fick, FNP

## 2020-07-25 ENCOUNTER — Encounter (INDEPENDENT_AMBULATORY_CARE_PROVIDER_SITE_OTHER): Payer: Self-pay | Admitting: Family Medicine

## 2020-08-08 ENCOUNTER — Other Ambulatory Visit: Payer: Self-pay

## 2020-08-08 ENCOUNTER — Encounter (INDEPENDENT_AMBULATORY_CARE_PROVIDER_SITE_OTHER): Payer: Self-pay | Admitting: Family Medicine

## 2020-08-08 ENCOUNTER — Other Ambulatory Visit (INDEPENDENT_AMBULATORY_CARE_PROVIDER_SITE_OTHER): Payer: Self-pay | Admitting: Family Medicine

## 2020-08-08 ENCOUNTER — Ambulatory Visit (INDEPENDENT_AMBULATORY_CARE_PROVIDER_SITE_OTHER): Payer: BC Managed Care – PPO | Admitting: Family Medicine

## 2020-08-08 VITALS — BP 144/82 | Ht 70.0 in | Wt 316.0 lb

## 2020-08-08 DIAGNOSIS — E1159 Type 2 diabetes mellitus with other circulatory complications: Secondary | ICD-10-CM

## 2020-08-08 DIAGNOSIS — Z6841 Body Mass Index (BMI) 40.0 and over, adult: Secondary | ICD-10-CM

## 2020-08-08 DIAGNOSIS — E1169 Type 2 diabetes mellitus with other specified complication: Secondary | ICD-10-CM | POA: Diagnosis not present

## 2020-08-08 DIAGNOSIS — F3289 Other specified depressive episodes: Secondary | ICD-10-CM

## 2020-08-08 DIAGNOSIS — I152 Hypertension secondary to endocrine disorders: Secondary | ICD-10-CM

## 2020-08-08 MED ORDER — BUPROPION HCL ER (SR) 150 MG PO TB12: 150.0000 mg | ORAL_TABLET | Freq: Every morning | ORAL | 0 refills | Status: DC

## 2020-08-08 MED ORDER — METOPROLOL SUCCINATE ER 50 MG PO TB24: 50.0000 mg | ORAL_TABLET | Freq: Every day | ORAL | 0 refills | Status: DC

## 2020-08-08 MED ORDER — OZEMPIC (0.25 OR 0.5 MG/DOSE) 2 MG/1.5ML ~~LOC~~ SOPN: 0.2500 mg | PEN_INJECTOR | SUBCUTANEOUS | 0 refills | Status: DC

## 2020-08-08 MED ORDER — ATORVASTATIN CALCIUM 10 MG PO TABS: 10.0000 mg | ORAL_TABLET | Freq: Every day | ORAL | 0 refills | Status: DC

## 2020-08-08 MED FILL — METOPROLOL SUCCINATE ER 50: 50 | 90 days supply | Qty: 90 | Fill #0

## 2020-08-08 MED FILL — ATORVASTATIN CALCIUM 10 MG: 10 | 90 days supply | Qty: 90 | Fill #0

## 2020-08-10 ENCOUNTER — Encounter (INDEPENDENT_AMBULATORY_CARE_PROVIDER_SITE_OTHER): Payer: Self-pay | Admitting: Family Medicine

## 2020-08-10 MED FILL — OZEMPIC 0.25 OR 0.5 MG/DOSE: 2 | 56 days supply | Qty: 2 | Fill #0

## 2020-08-10 NOTE — Progress Notes (Signed)
Chief Complaint:   OBESITY Brandi Lester is here to discuss her progress with her obesity treatment plan along with follow-up of her obesity related diagnoses. Brandi Lester is on the Category 4 Plan and states she is following her eating plan approximately 80% of the time. Brandi Lester states she is walking or bike riding for 45 minutes 3 times per week.  Today's visit was #: 28 Starting weight: 325 lbs Starting date: 05/23/2017 Today's weight: 316 lbs Today's date: 08/08/2020 Total lbs lost to date: 9 Total lbs lost since last in-office visit: 2  Interim History: Brandi Lester is starting to have some cravings and hunger recently. She has been on Ozempic to help with appetite at 0.125 mg weekly but feels she is used to it now. She increased to the full 0.25 mg dose and is tolerating it fine. She had a bad experience with GLP-1 agonist (N/V) in the past so we are titrating very slowly.  She is going home to [place] for Christmas.  Subjective:   1. Type 2 diabetes mellitus with other specified complication, without long-term current use of insulin (HCC) Well controlled on Ozempic and metformin. Brandi Lester increased her dose to 0.25 mg of Ozempic weekly because she noticed more hunger and cravings.  Lab Results  Component Value Date   HGBA1C 6.1 03/21/2020   HGBA1C 5.9 (H) 12/01/2019   HGBA1C 5.9 (H) 07/07/2019   Lab Results  Component Value Date   MICROALBUR 0.50 12/15/2012   LDLCALC 60 03/21/2020   CREATININE 0.77 03/21/2020   Lab Results  Component Value Date   INSULIN 20.7 12/01/2019   INSULIN 12.9 07/07/2019   INSULIN 21.2 11/20/2018   INSULIN 15.4 07/14/2018   INSULIN 12.9 09/12/2017   2. Hypertension associated with type 2 diabetes mellitus (Brandi Lester) Brandi Lester's blood pressure is slightly elevated today. Cardiovascular ROS: no chest pain or dyspnea on exertion.  BP Readings from Last 3 Encounters:  08/08/20 (!) 144/82  07/20/20 128/77  06/29/20 135/85   Lab Results  Component Value  Date   CREATININE 0.77 03/21/2020   CREATININE 0.80 12/01/2019   CREATININE 0.60 07/07/2019   3. Other depression, with emotional eating Brandi Lester notes her cravings are well controlled on bupropion.  Assessment/Plan:   1. Type 2 diabetes mellitus with other specified complication, without long-term current use of insulin ([place]) . We will refill Ozempic for 1 month and continue the 0.254 mg dose.   - Semaglutide,0.25 or 0.5MG /DOS, (OZEMPIC, 0.25 OR 0.5 MG/DOSE,) 2 MG/1.5ML SOPN; Inject 0.25 mg into the skin once a week.  Dispense: 1.5 mL; Refill: 0  2. Hypertension associated with type 2 diabetes mellitus ([place]) . We will refill metoprolol XL and Lipitor for 90 days with no refills. Continue to monitor.   - metoprolol succinate (TOPROL-XL) 50 MG 24 hr tablet; Take 1 tablet (50 mg total) by mouth daily. Take with or immediately following a meal.  Dispense: 90 tablet; Refill: 0 - atorvastatin (LIPITOR) 10 MG tablet; Take 1 tablet (10 mg total) by mouth daily.  Dispense: 90 tablet; Refill: 0  3. Other depression, with emotional eating  We will refill bupropion for 90 days with no refills.  - buPROPion (WELLBUTRIN SR) 150 MG 12 hr tablet; Take 1 tablet (150 mg total) by mouth in the morning.  Dispense: 90 tablet; Refill: 0  4. Class 3 severe obesity with serious comorbidity and body mass index (BMI) of 45.0 to 49.9 in adult, unspecified obesity type (HCC) Brandi Lester is currently in the action stage of  change. As such, her goal is to continue with weight loss efforts. She has agreed to the Category 4 Plan.   Exercise goals: As is.  Behavioral modification strategies: increasing lean protein intake and no skipping meals.  Brandi Lester has agreed to follow-up with our clinic in 4 weeks.  Objective:   Blood pressure (!) 144/82, height 5\' 10"  (1.778 m), weight (!) 316 lb (143.3 kg). Body mass index is 45.34 kg/m.  General: Cooperative, alert, well developed, in no acute distress. HEENT:  Conjunctivae and lids unremarkable. Cardiovascular: Regular rhythm.  Lungs: Normal work of breathing. Neurologic: No focal deficits.   Lab Results  Component Value Date   CREATININE 0.77 03/21/2020   BUN 14 03/21/2020   NA 138 03/21/2020   K 3.9 03/21/2020   CL 102 03/21/2020   CO2 29 03/21/2020   Lab Results  Component Value Date   ALT 10 03/21/2020   AST 8 03/21/2020   ALKPHOS 68 03/21/2020   BILITOT 0.3 03/21/2020   Lab Results  Component Value Date   HGBA1C 6.1 03/21/2020   HGBA1C 5.9 (H) 12/01/2019   HGBA1C 5.9 (H) 07/07/2019   HGBA1C 6.1 (H) 11/20/2018   HGBA1C 5.9 (H) 07/14/2018   Lab Results  Component Value Date   INSULIN 20.7 12/01/2019   INSULIN 12.9 07/07/2019   INSULIN 21.2 11/20/2018   INSULIN 15.4 07/14/2018   INSULIN 12.9 09/12/2017   Lab Results  Component Value Date   TSH 1.76 03/21/2020   Lab Results  Component Value Date   CHOL 106 03/21/2020   HDL 33.00 (L) 03/21/2020   LDLCALC 60 03/21/2020   TRIG 65.0 03/21/2020   CHOLHDL 3 03/21/2020   Lab Results  Component Value Date   WBC 7.9 03/21/2020   HGB 12.2 03/21/2020   HCT 36.4 03/21/2020   MCV 84.5 03/21/2020   PLT 281.0 03/21/2020   No results found for: IRON, TIBC, FERRITIN  Attestation Statements:   Reviewed by clinician on day of visit: allergies, medications, problem list, medical history, surgical history, family history, social history, and previous encounter notes.   Wilhemena Durie, am acting as Location manager for [employer], FNP-C.  I have reviewed the above documentation for accuracy and completeness, and I agree with the above. -  Georgianne Fick, FNP

## 2020-08-15 ENCOUNTER — Encounter: Payer: Self-pay | Admitting: Family Medicine

## 2020-08-15 ENCOUNTER — Other Ambulatory Visit: Payer: Self-pay | Admitting: Family Medicine

## 2020-08-15 MED ORDER — SULFAMETHOXAZOLE-TRIMETHOPRIM 800-160 MG PO TABS: 1.0000 | ORAL_TABLET | Freq: Two times a day (BID) | ORAL | 0 refills | Status: DC

## 2020-08-15 MED ORDER — NYSTATIN 100000 UNIT/ML MT SUSP: 5.0000 mL | Freq: Four times a day (QID) | OROMUCOSAL | 0 refills | Status: DC

## 2020-08-16 MED FILL — NYSTATIN 100000 UNIT/ML SUS: 100000 | 3 days supply | Qty: 60 | Fill #0

## 2020-08-16 MED FILL — SULFAMETHOXAZOLE-TMP DS TAB: 800-160 | 10 days supply | Qty: 20 | Fill #0

## 2020-08-29 MED FILL — HYDROCHLOROTHIAZIDE 25 MG T: 25 | 90 days supply | Qty: 90 | Fill #1

## 2020-08-29 MED FILL — AMLODIPINE BESYLATE 10 MG T: 10 | 90 days supply | Qty: 90 | Fill #1

## 2020-08-31 ENCOUNTER — Other Ambulatory Visit: Payer: Self-pay | Admitting: Family Medicine

## 2020-08-31 DIAGNOSIS — Z1231 Encounter for screening mammogram for malignant neoplasm of breast: Secondary | ICD-10-CM

## 2020-09-05 ENCOUNTER — Telehealth (INDEPENDENT_AMBULATORY_CARE_PROVIDER_SITE_OTHER): Payer: BC Managed Care – PPO | Admitting: Family Medicine

## 2020-09-05 ENCOUNTER — Other Ambulatory Visit (INDEPENDENT_AMBULATORY_CARE_PROVIDER_SITE_OTHER): Payer: Self-pay | Admitting: Family Medicine

## 2020-09-05 DIAGNOSIS — F3289 Other specified depressive episodes: Secondary | ICD-10-CM

## 2020-09-05 DIAGNOSIS — E1169 Type 2 diabetes mellitus with other specified complication: Secondary | ICD-10-CM | POA: Diagnosis not present

## 2020-09-05 DIAGNOSIS — Z6841 Body Mass Index (BMI) 40.0 and over, adult: Secondary | ICD-10-CM

## 2020-09-05 MED ORDER — METFORMIN HCL 500 MG PO TABS: 500.0000 mg | ORAL_TABLET | Freq: Two times a day (BID) | ORAL | 0 refills | Status: DC

## 2020-09-05 MED FILL — BUPROPION HCL ER (SR) 150 M: 150 | 90 days supply | Qty: 90 | Fill #0

## 2020-09-05 MED FILL — METFORMIN HCL 500 MG TABS: 500 | 90 days supply | Qty: 180 | Fill #0

## 2020-09-05 MED FILL — LOSARTAN POTASSIUM 100 MG T: 100 | 90 days supply | Qty: 90 | Fill #1

## 2020-09-06 ENCOUNTER — Encounter: Payer: BC Managed Care – PPO | Admitting: Family Medicine

## 2020-09-07 ENCOUNTER — Encounter (INDEPENDENT_AMBULATORY_CARE_PROVIDER_SITE_OTHER): Payer: Self-pay | Admitting: Family Medicine

## 2020-09-07 ENCOUNTER — Other Ambulatory Visit: Payer: Self-pay

## 2020-09-07 ENCOUNTER — Ambulatory Visit
Admission: RE | Admit: 2020-09-07 | Discharge: 2020-09-07 | Disposition: A | Discharge status: Home / Self Care | Payer: Self-pay | Source: Ambulatory Visit

## 2020-09-07 DIAGNOSIS — Z1231 Encounter for screening mammogram for malignant neoplasm of breast: Secondary | ICD-10-CM

## 2020-09-07 NOTE — Progress Notes (Signed)
TeleHealth Visit:  Due to the COVID-19 pandemic, this visit was completed with telemedicine (audio/video) technology to reduce patient and provider exposure as well as to preserve personal protective equipment.   Brandi Lester has verbally consented to this TeleHealth visit. The patient is located at home, the provider is located at the [facility] office. The participants in this visit include the listed provider and patient. The visit was conducted today via video.   Chief Complaint: OBESITY Brandi Lester is here to discuss her progress with her obesity treatment plan along with follow-up of her obesity related diagnoses. Brandi Lester is on the Category 4 Plan and states she is following her eating plan approximately 80% of the time. Brandi Lester states she is exercising 0 minutes 0 times per week.  Today's visit was #: 62 Starting weight: 325 lbs Starting date: 05/23/2017  Interim History: Brandi Lester traveled to [place] for Christmas but did not overeat due to lack of appetite from Ozempic. She thinks she lost weight. She feels she may not be getting all of the prescribed protein in but she supplements with a shake. She has not exercised the past few weeks but usually rides stationary bike at home.  Subjective:   1. Type 2 diabetes mellitus with other specified complication, without long-term current use of insulin (HCC) Well controlled on Ozempic 0.25 mg weekly and Metformin 500 mg BID. She denies hypoglycemia. Occasional nausea with Ozempic. Brandi Lester notes her appetite has greatly decreased with Ozempic.   Lab Results  Component Value Date   HGBA1C 6.1 03/21/2020   HGBA1C 5.9 (H) 12/01/2019   HGBA1C 5.9 (H) 07/07/2019   Lab Results  Component Value Date   MICROALBUR 0.50 12/15/2012   LDLCALC 60 03/21/2020   CREATININE 0.77 03/21/2020   Lab Results  Component Value Date   INSULIN 20.7 12/01/2019   INSULIN 12.9 07/07/2019   INSULIN 21.2 11/20/2018   INSULIN 15.4 07/14/2018    INSULIN 12.9 09/12/2017    2. Other depression, with emotional eating Brandi Lester notes her cravings are well controlled with Wellbutrin.  Assessment/Plan:   1. Type 2 diabetes mellitus with other specified complication, without long-term current use of insulin (HCC) Continue Ozempic and metformin. We will refill Metformin 500 mg for 1 month, as per below.   - metFORMIN (GLUCOPHAGE) 500 MG tablet; Take 1 tablet (500 mg total) by mouth 2 (two) times daily with a meal.  Dispense: 180 tablet; Refill: 0  2. Other depression, with emotional eating Continue current treatment plan of Wellbutrin 150 mg. Brandi Lester denies need for refill today.  3. Class 3 severe obesity with serious comorbidity and body mass index (BMI) of 45.0 to 49.9 in adult, unspecified obesity type (HCC) Brandi Lester is currently in the action stage of change. As such, her goal is to continue with weight loss efforts. She has agreed to the Category 4 Plan.   Exercise goals: For substantial health benefits, adults should do at least 150 minutes (2 hours and 30 minutes) a week of moderate-intensity, or 75 minutes (1 hour and 15 minutes) a week of vigorous-intensity aerobic physical activity, or an equivalent combination of moderate- and vigorous-intensity aerobic activity. Aerobic activity should be performed in episodes of at least 10 minutes, and preferably, it should be spread throughout the week.  Behavioral modification strategies: increasing lean protein intake.  Brandi Lester has agreed to follow-up with our clinic in 3 weeks.   Objective:   VITALS: Per patient if applicable, see vitals. GENERAL: Alert and in no acute distress.  CARDIOPULMONARY: No increased WOB. Speaking in clear sentences.  PSYCH: Pleasant and cooperative. Speech normal rate and rhythm. Affect is appropriate. Insight and judgement are appropriate. Attention is focused, linear, and appropriate.  NEURO: Oriented as arrived to appointment on time with no prompting.    Lab Results  Component Value Date   CREATININE 0.77 03/21/2020   BUN 14 03/21/2020   NA 138 03/21/2020   K 3.9 03/21/2020   CL 102 03/21/2020   CO2 29 03/21/2020   Lab Results  Component Value Date   ALT 10 03/21/2020   AST 8 03/21/2020   ALKPHOS 68 03/21/2020   BILITOT 0.3 03/21/2020   Lab Results  Component Value Date   HGBA1C 6.1 03/21/2020   HGBA1C 5.9 (H) 12/01/2019   HGBA1C 5.9 (H) 07/07/2019   HGBA1C 6.1 (H) 11/20/2018   HGBA1C 5.9 (H) 07/14/2018   Lab Results  Component Value Date   INSULIN 20.7 12/01/2019   INSULIN 12.9 07/07/2019   INSULIN 21.2 11/20/2018   INSULIN 15.4 07/14/2018   INSULIN 12.9 09/12/2017   Lab Results  Component Value Date   TSH 1.76 03/21/2020   Lab Results  Component Value Date   CHOL 106 03/21/2020   HDL 33.00 (L) 03/21/2020   LDLCALC 60 03/21/2020   TRIG 65.0 03/21/2020   CHOLHDL 3 03/21/2020   Lab Results  Component Value Date   WBC 7.9 03/21/2020   HGB 12.2 03/21/2020   HCT 36.4 03/21/2020   MCV 84.5 03/21/2020   PLT 281.0 03/21/2020   No results found for: IRON, TIBC, FERRITIN  Attestation Statements:   Reviewed by clinician on day of visit: allergies, medications, problem list, medical history, surgical history, family history, social history, and previous encounter notes.  Coral Ceo, am acting as Location manager for [employer], [place].  I have reviewed the above documentation for accuracy and completeness, and I agree with the above. - Georgianne Fick, FNP

## 2020-09-08 NOTE — Telephone Encounter (Signed)
This patient was last seen by Adah Salvage, FNP and currently has an upcoming appt scheduled on 09/26/20 with her.

## 2020-09-19 ENCOUNTER — Ambulatory Visit: Payer: BC Managed Care – PPO | Admitting: Family Medicine

## 2020-09-19 ENCOUNTER — Encounter (INDEPENDENT_AMBULATORY_CARE_PROVIDER_SITE_OTHER): Payer: Self-pay | Admitting: Family Medicine

## 2020-09-19 ENCOUNTER — Telehealth (INDEPENDENT_AMBULATORY_CARE_PROVIDER_SITE_OTHER): Payer: BC Managed Care – PPO | Admitting: Family Medicine

## 2020-09-19 DIAGNOSIS — R002 Palpitations: Secondary | ICD-10-CM | POA: Diagnosis not present

## 2020-09-19 DIAGNOSIS — Z6841 Body Mass Index (BMI) 40.0 and over, adult: Secondary | ICD-10-CM | POA: Diagnosis not present

## 2020-09-19 DIAGNOSIS — E1169 Type 2 diabetes mellitus with other specified complication: Secondary | ICD-10-CM

## 2020-09-21 NOTE — Progress Notes (Signed)
TeleHealth Visit:  Due to the COVID-19 pandemic, this visit was completed with telemedicine (audio/video) technology to reduce patient and provider exposure as well as to preserve personal protective equipment.   Brandi Lester has verbally consented to this TeleHealth visit. The patient is located at home, the provider is located at the [facility] office. The participants in this visit include the listed provider and patient and. The visit was conducted today via Mychart Video.   Chief Complaint: OBESITY Brandi Lester is here to discuss her progress with her obesity treatment plan along with follow-up of her obesity related diagnoses. Brandi Lester is on the Category 4 Plan and states she is following her eating plan approximately 75% of the time. Brandi states she is working out on the exercise bike 30-45 minutes 4 times per week.  Today's visit was #: 79 Starting weight: 325 lbs Starting date: 05/23/2017  Interim History: Brandi weighed today and is 311 lbs. This reflects weight maintenance. Brandi admits to having chips several days. She bought a large bag while shopping.  Subjective:   1. Palpitation She had an episode of light headedness and palpitations on a day that she did not have much liquid intake. No palpitations since then. 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   2. Type 2 diabetes mellitus with other specified complication, without long-term current use of insulin ([place]) . Brandi is on Ozempic 0.25 mg. Has not had nausea for the last 3 weeks. Diabetes is well controlled. Last A1c was 6.1. She is on metformin and Ozempic.  Lab Results  Component Value Date   HGBA1C 6.1 03/21/2020   HGBA1C 5.9 (H) 12/01/2019   HGBA1C 5.9 (H) 07/07/2019   Lab Results  Component Value Date   MICROALBUR 0.50 12/15/2012   LDLCALC 60 03/21/2020   CREATININE 0.77 03/21/2020   Lab Results  Component Value Date   INSULIN 20.7 12/01/2019   INSULIN 12.9 07/07/2019   INSULIN 21.2  11/20/2018   INSULIN 15.4 07/14/2018   INSULIN 12.9 09/12/2017    Assessment/Plan:   1. Palpitation Brandi will follow up with Dr Charlett Blake for future issues. She will increase fluid intake. May have Gatorade 0 or Powerade 0.  2. Type 2 diabetes mellitus with other specified complication, without long-term current use of insulin (HCC)  Brandi will continue Ozempic 0.25 mg weekly. We may consider increasing to 0.37 mg next office visit.  Class 3 severe obesity with serious comorbidity and body mass index (BMI) of 45.0 to 49.9 in adult, unspecified obesity type (HCC) Brandi is currently in the action stage of change. As such, her goal is to continue with weight loss efforts. She has agreed to the Category 4 Plan.  Buy small snack bags if she wants chips in future.  Exercise goals: No exercise has been prescribed at this time.  Behavioral modification strategies: increasing lean protein intake.  [name] has agreed to follow-up with our clinic in 2-3 weeks.   Objective:   VITALS: Per patient if applicable, see vitals. GENERAL: Alert and in no acute distress. CARDIOPULMONARY: No increased WOB. Speaking in clear sentences.  PSYCH: Pleasant and cooperative. Speech normal rate and rhythm. Affect is appropriate. Insight and judgement are appropriate. Attention is focused, linear, and appropriate.  NEURO: Oriented as arrived to appointment on time with no prompting.   Lab Results  Component Value Date   CREATININE 0.77 03/21/2020   BUN 14 03/21/2020   NA 138 03/21/2020   K 3.9 03/21/2020   CL  102 03/21/2020   CO2 29 03/21/2020   Lab Results  Component Value Date   ALT 10 03/21/2020   AST 8 03/21/2020   ALKPHOS 68 03/21/2020   BILITOT 0.3 03/21/2020   Lab Results  Component Value Date   HGBA1C 6.1 03/21/2020   HGBA1C 5.9 (H) 12/01/2019   HGBA1C 5.9 (H) 07/07/2019   HGBA1C 6.1 (H) 11/20/2018   HGBA1C 5.9 (H) 07/14/2018   Lab Results  Component Value Date   INSULIN 20.7  12/01/2019   INSULIN 12.9 07/07/2019   INSULIN 21.2 11/20/2018   INSULIN 15.4 07/14/2018   INSULIN 12.9 09/12/2017   Lab Results  Component Value Date   TSH 1.76 03/21/2020   Lab Results  Component Value Date   CHOL 106 03/21/2020   HDL 33.00 (L) 03/21/2020   LDLCALC 60 03/21/2020   TRIG 65.0 03/21/2020   CHOLHDL 3 03/21/2020   Lab Results  Component Value Date   WBC 7.9 03/21/2020   HGB 12.2 03/21/2020   HCT 36.4 03/21/2020   MCV 84.5 03/21/2020   PLT 281.0 03/21/2020   No results found for: IRON, TIBC, FERRITIN  Attestation Statements:   Reviewed by clinician on day of visit: allergies, medications, problem list, medical history, surgical history, family history, social history, and previous encounter notes.   Tula Nakayama, am acting as Location manager for Georgianne Fick, FNP.  I have reviewed the above documentation for accuracy and completeness, and I agree with the above. - Georgianne Fick, FNP

## 2020-09-26 ENCOUNTER — Ambulatory Visit (INDEPENDENT_AMBULATORY_CARE_PROVIDER_SITE_OTHER): Payer: Self-pay | Admitting: Family Medicine

## 2020-09-27 ENCOUNTER — Other Ambulatory Visit: Payer: Self-pay | Admitting: Family Medicine

## 2020-09-27 DIAGNOSIS — T7840XA Allergy, unspecified, initial encounter: Secondary | ICD-10-CM

## 2020-09-27 DIAGNOSIS — I10 Essential (primary) hypertension: Secondary | ICD-10-CM

## 2020-09-27 DIAGNOSIS — E876 Hypokalemia: Secondary | ICD-10-CM

## 2020-09-27 MED FILL — POTASSIUM CHLORIDE CRYS ER: 20 | 90 days supply | Qty: 270 | Fill #0

## 2020-10-06 ENCOUNTER — Other Ambulatory Visit (INDEPENDENT_AMBULATORY_CARE_PROVIDER_SITE_OTHER): Payer: Self-pay | Admitting: Family Medicine

## 2020-10-06 ENCOUNTER — Encounter (INDEPENDENT_AMBULATORY_CARE_PROVIDER_SITE_OTHER): Payer: Self-pay | Admitting: Family Medicine

## 2020-10-06 ENCOUNTER — Ambulatory Visit (INDEPENDENT_AMBULATORY_CARE_PROVIDER_SITE_OTHER): Payer: BC Managed Care – PPO | Admitting: Family Medicine

## 2020-10-06 ENCOUNTER — Other Ambulatory Visit: Payer: Self-pay

## 2020-10-06 VITALS — BP 143/81 | HR 93 | Temp 98.6°F | Ht 70.0 in | Wt 309.0 lb

## 2020-10-06 DIAGNOSIS — Z6841 Body Mass Index (BMI) 40.0 and over, adult: Secondary | ICD-10-CM | POA: Diagnosis not present

## 2020-10-06 DIAGNOSIS — Z9189 Other specified personal risk factors, not elsewhere classified: Secondary | ICD-10-CM | POA: Diagnosis not present

## 2020-10-06 DIAGNOSIS — E1169 Type 2 diabetes mellitus with other specified complication: Secondary | ICD-10-CM

## 2020-10-06 DIAGNOSIS — R002 Palpitations: Secondary | ICD-10-CM

## 2020-10-06 MED ORDER — OZEMPIC (0.25 OR 0.5 MG/DOSE) 2 MG/1.5ML ~~LOC~~ SOPN: 0.5000 mg | PEN_INJECTOR | SUBCUTANEOUS | 0 refills | Status: DC

## 2020-10-06 MED FILL — OZEMPIC 0.25 OR 0.5 MG/DOSE: 2 | 28 days supply | Qty: 2 | Fill #0

## 2020-10-06 NOTE — Progress Notes (Signed)
ref

## 2020-10-10 MED FILL — MEDROXYPROGESTERONE 10 MG T: 10 | 90 days supply | Qty: 90 | Fill #0

## 2020-10-10 NOTE — Progress Notes (Signed)
Chief Complaint:   OBESITY Brandi Lester is here to discuss her progress with her obesity treatment plan along with follow-up of her obesity related diagnoses. Brandi Lester is on the Category 4 Plan and states she is following her eating plan approximately 75% of the time. Brandi Lester states she is doing cardio for 45 minutes 4 times per week.  Today's visit was #: 43 Starting weight: 325 lbs Starting date: 05/23/2017 Today's weight: 309 lbs Today's date: 10/06/2020 Total lbs lost to date: 16 Total lbs lost since last in-office visit: 7  Interim History: Brandi Lester is doing very well. He is down 7 lbs today. Her hunger and cravings are well controlled on Ozempic. She reports getting adequate protein in. She is concerned about avoiding the candy at Valentine's Day.  Subjective:   1. Type 2 diabetes mellitus with other specified complication, without long-term current use of insulin (HCC) Brandi Lester is on Ozempic 0.25 mg and she noted hunger. She increased to 6.43 mg + 9 clicks (.329 mg).  Lab Results  Component Value Date   HGBA1C 6.1 03/21/2020   HGBA1C 5.9 (H) 12/01/2019   HGBA1C 5.9 (H) 07/07/2019   Lab Results  Component Value Date   MICROALBUR 0.50 12/15/2012   LDLCALC 60 03/21/2020   CREATININE 0.77 03/21/2020   Lab Results  Component Value Date   INSULIN 20.7 12/01/2019   INSULIN 12.9 07/07/2019   INSULIN 21.2 11/20/2018   INSULIN 15.4 07/14/2018   INSULIN 12.9 09/12/2017   2. Palpitation Brandi Lester is having some palpitations several times per week. She feels hydration helps. She also feels it may be due to stress and caffeine. 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 Her sister died from myocardial infarction in her 71's.  3. At risk for heart disease Brandi Lester is at a higher than average risk for cardiovascular disease due to obesity and family history of early coronary artery disease.   Assessment/Plan:   1. Type 2 diabetes mellitus with other specified  complication, without long-term current use of insulin ([place]) . We will refill Ozempic for 1 month. Brandi Lester will continue 5.18 mg plus 9 clicks for now.  - Semaglutide,0.25 or 0.5MG /DOS, (OZEMPIC, 0.25 OR 0.5 MG/DOSE,) 2 MG/1.5ML SOPN; Inject 0.5 mg into the skin once a week.  Dispense: 1.5 mL; Refill: 0  2. Palpitation We will refer to Cardiology at [facility] to see Dr. Berniece Salines. Brandi Lester is to increase fluids, decrease caffeine, and avoid exercise for now.   - Ambulatory referral to Cardiology  3. At risk for heart disease Brandi Lester was given approximately 15 minutes of coronary artery disease prevention counseling today. She is 55 y.o. female and has risk factors for heart disease including obesity. We discussed intensive lifestyle modifications today with an emphasis on specific weight loss instructions and strategies.   Repetitive spaced learning was employed today to elicit superior memory formation and behavioral change.  4. Class 3 severe obesity with serious comorbidity and body mass index (BMI) of 40.0 to 44.9 in adult, unspecified obesity type (HCC) Brandi Lester is currently in the action stage of change. As such, her goal is to continue with weight loss efforts. She has agreed to the Category 4 Plan.   1. We made a plan for Valentine's Day candy. 2. Reduce caffeine.  Exercise goals: No exercise has been prescribed at this time.  Behavioral modification strategies: holiday eating strategies .  Brandi Lester has agreed to follow-up with our clinic in 3 weeks.  Objective:   Blood pressure (!) 143/81, pulse 93, temperature 98.6 F (37 C), height 5\' 10"  (1.778 m), weight (!) 309 lb (140.2 kg), SpO2 98 %. Body mass index is 44.34 kg/m.  General: Cooperative, alert, well developed, in no acute distress. HEENT: Conjunctivae and lids unremarkable. Cardiovascular: Regular rhythm.  Lungs: Normal work of breathing. Neurologic: No focal deficits.   Lab Results  Component Value  Date   CREATININE 0.77 03/21/2020   BUN 14 03/21/2020   NA 138 03/21/2020   K 3.9 03/21/2020   CL 102 03/21/2020   CO2 29 03/21/2020   Lab Results  Component Value Date   ALT 10 03/21/2020   AST 8 03/21/2020   ALKPHOS 68 03/21/2020   BILITOT 0.3 03/21/2020   Lab Results  Component Value Date   HGBA1C 6.1 03/21/2020   HGBA1C 5.9 (H) 12/01/2019   HGBA1C 5.9 (H) 07/07/2019   HGBA1C 6.1 (H) 11/20/2018   HGBA1C 5.9 (H) 07/14/2018   Lab Results  Component Value Date   INSULIN 20.7 12/01/2019   INSULIN 12.9 07/07/2019   INSULIN 21.2 11/20/2018   INSULIN 15.4 07/14/2018   INSULIN 12.9 09/12/2017   Lab Results  Component Value Date   TSH 1.76 03/21/2020   Lab Results  Component Value Date   CHOL 106 03/21/2020   HDL 33.00 (L) 03/21/2020   LDLCALC 60 03/21/2020   TRIG 65.0 03/21/2020   CHOLHDL 3 03/21/2020   Lab Results  Component Value Date   WBC 7.9 03/21/2020   HGB 12.2 03/21/2020   HCT 36.4 03/21/2020   MCV 84.5 03/21/2020   PLT 281.0 03/21/2020   No results found for: IRON, TIBC, FERRITIN  Attestation Statements:   Reviewed by clinician on day of visit: allergies, medications, problem list, medical history, surgical history, family history, social history, and previous encounter notes.   Wilhemena Durie, am acting as Location manager for [employer], FNP-C.  I have reviewed the above documentation for accuracy and completeness, and I agree with the above. -  Georgianne Fick, FNP

## 2020-10-18 DIAGNOSIS — F419 Anxiety disorder, unspecified: Secondary | ICD-10-CM | POA: Insufficient documentation

## 2020-10-18 DIAGNOSIS — K219 Gastro-esophageal reflux disease without esophagitis: Secondary | ICD-10-CM | POA: Insufficient documentation

## 2020-10-18 DIAGNOSIS — I10 Essential (primary) hypertension: Secondary | ICD-10-CM | POA: Insufficient documentation

## 2020-10-18 DIAGNOSIS — K589 Irritable bowel syndrome without diarrhea: Secondary | ICD-10-CM | POA: Insufficient documentation

## 2020-10-18 DIAGNOSIS — F988 Other specified behavioral and emotional disorders with onset usually occurring in childhood and adolescence: Secondary | ICD-10-CM | POA: Insufficient documentation

## 2020-10-18 DIAGNOSIS — E739 Lactose intolerance, unspecified: Secondary | ICD-10-CM | POA: Insufficient documentation

## 2020-10-18 DIAGNOSIS — M2550 Pain in unspecified joint: Secondary | ICD-10-CM | POA: Insufficient documentation

## 2020-10-18 DIAGNOSIS — I493 Ventricular premature depolarization: Secondary | ICD-10-CM | POA: Insufficient documentation

## 2020-10-18 DIAGNOSIS — T7840XA Allergy, unspecified, initial encounter: Secondary | ICD-10-CM | POA: Insufficient documentation

## 2020-10-18 DIAGNOSIS — I152 Hypertension secondary to endocrine disorders: Secondary | ICD-10-CM | POA: Insufficient documentation

## 2020-10-18 DIAGNOSIS — T783XXA Angioneurotic edema, initial encounter: Secondary | ICD-10-CM | POA: Insufficient documentation

## 2020-10-18 DIAGNOSIS — R600 Localized edema: Secondary | ICD-10-CM | POA: Insufficient documentation

## 2020-10-18 DIAGNOSIS — M549 Dorsalgia, unspecified: Secondary | ICD-10-CM | POA: Insufficient documentation

## 2020-10-18 DIAGNOSIS — E1159 Type 2 diabetes mellitus with other circulatory complications: Secondary | ICD-10-CM | POA: Insufficient documentation

## 2020-10-18 DIAGNOSIS — R7303 Prediabetes: Secondary | ICD-10-CM | POA: Insufficient documentation

## 2020-10-18 DIAGNOSIS — M1990 Unspecified osteoarthritis, unspecified site: Secondary | ICD-10-CM | POA: Insufficient documentation

## 2020-10-19 ENCOUNTER — Other Ambulatory Visit: Payer: Self-pay

## 2020-10-19 ENCOUNTER — Encounter: Payer: Self-pay | Admitting: Cardiology

## 2020-10-19 ENCOUNTER — Ambulatory Visit (INDEPENDENT_AMBULATORY_CARE_PROVIDER_SITE_OTHER): Payer: BC Managed Care – PPO

## 2020-10-19 ENCOUNTER — Ambulatory Visit: Payer: BC Managed Care – PPO | Admitting: Cardiology

## 2020-10-19 VITALS — BP 140/86 | HR 89 | Ht 70.0 in | Wt 315.0 lb

## 2020-10-19 DIAGNOSIS — E782 Mixed hyperlipidemia: Secondary | ICD-10-CM | POA: Insufficient documentation

## 2020-10-19 DIAGNOSIS — R079 Chest pain, unspecified: Secondary | ICD-10-CM | POA: Insufficient documentation

## 2020-10-19 DIAGNOSIS — E118 Type 2 diabetes mellitus with unspecified complications: Secondary | ICD-10-CM

## 2020-10-19 DIAGNOSIS — R002 Palpitations: Secondary | ICD-10-CM | POA: Diagnosis not present

## 2020-10-19 DIAGNOSIS — I493 Ventricular premature depolarization: Secondary | ICD-10-CM

## 2020-10-19 NOTE — Progress Notes (Signed)
Cardiology Office Note:    Date:  10/19/2020   ID:  Brandi Lester, DOB 1965-09-25, MRN [id number]  PCP:  Mosie Lukes, MD  Cardiologist:  Berniece Salines, DO  Electrophysiologist:  None   Referring MD: Georgianne Fick, FNP   Chief Complaint  Patient presents with  . Palpitations   History of Present Illness:    Brandi Lester is a 55 y.o. female with a hx of diabetes, hypertension, hyperlipidemia, premature coronary artery disease with her mother first MI in her early 34s and her sister dying from an MI at age 50 is here today to establish cardiac care.  Patient was sent by her PCP as she has been experiencing intermittent palpitations.  For this she was started on metoprolol which she tells me since she was started on this medication is has gotten worse.  In addition she has had some intermittent chest pain.  She described it as a diffuse midsternal sensation that she experiences after she exerts herself.  The last was seconds and resolved.  The palpitation she reports is significantly bothersome and she feels as if recently she has been under a lot of stress and that has worsened her symptoms.  Past Medical History:  Diagnosis Date  . ADD (attention deficit disorder)   . Allergy    allergic rhinitis  . Anemia 07/25/2013  . Angio-edema   . Anxiety   . Arthritis    not dx'd  . Back pain   . Costochondritis 02/20/2015  . Diabetes mellitus    Type II   previously 3 years ago - no longer on meds   . Dyslipidemia 07/25/2013  . Fungus infection 10/13   Left great toe  . GERD (gastroesophageal reflux disease)   . Hypertension   . IBS (irritable bowel syndrome)   . Ingrown nail 10/13   right foot next to the last toe  . Insomnia 04/20/2013  . Joint pain   . Lactose intolerance   . Leg edema   . Obesity   . Pedal edema 02/20/2015  . Prediabetes   . Preventative health care 09/25/2015  . PVC (premature ventricular contraction)     Past Surgical History:   Procedure Laterality Date  . HYSTEROSCOPY N/A 10/04/2015   Procedure: HYSTEROSCOPY with Removal IUD;  Surgeon: Vanessa Kick, MD;  Location: [facility];  Service: Gynecology;  Laterality: N/A;  . lasik     eye surgery  . WISDOM TOOTH EXTRACTION      Current Medications: Current Meds  Medication Sig  . acetaminophen (TYLENOL 8 HOUR) 650 MG CR tablet Take 1 tablet (650 mg total) every 8 (eight) hours as needed by mouth for pain.  [name] amLODipine (NORVASC) 10 MG tablet Take 1 tablet (10 mg total) by mouth daily.  [name] atorvastatin (LIPITOR) 10 MG tablet Take 1 tablet (10 mg total) by mouth daily.  [name] azelastine (ASTELIN) 0.1 % nasal spray Place 2 sprays into both nostrils 2 (two) times daily. Use in each nostril as directed (Patient taking differently: Place 2 sprays into both nostrils as needed. Use in each nostril as directed)  . buPROPion (WELLBUTRIN SR) 150 MG 12 hr tablet Take 1 tablet (150 mg total) by mouth in the morning.  . cholecalciferol (VITAMIN D3) 25 MCG (1000 UT) tablet Take 4,000 Units by mouth daily.  [name] EPINEPHrine (EPIPEN 2-PAK) 0.3 mg/0.3 mL IJ SOAJ injection Use as directed for severe allergic reaction  . fluticasone (FLONASE) 50 MCG/ACT nasal spray Place 2 sprays  into both nostrils daily as needed for allergies.  . hydrochlorothiazide (HYDRODIURIL) 25 MG tablet Take 1 tablet (25 mg total) by mouth daily.  [name] ibuprofen (ADVIL,MOTRIN) 600 MG tablet Take 1 tablet (600 mg total) by mouth every 6 (six) hours as needed.  [name] losartan (COZAAR) 100 MG tablet Take 1 tablet (100 mg total) by mouth at bedtime.  . medroxyPROGESTERone (PROVERA) 10 MG tablet Take 1 tablet (10 mg total) by mouth daily.  . metFORMIN (GLUCOPHAGE) 500 MG tablet Take 1 tablet (500 mg total) by mouth 2 (two) times daily with a meal.  . metoprolol succinate (TOPROL-XL) 50 MG 24 hr tablet Take 1 tablet (50 mg total) by mouth daily. Take with or immediately following a meal.  . montelukast (SINGULAIR) 10 MG tablet Take 1 tablet  (10 mg total) by mouth at bedtime as needed.  . Omega-3 Fatty Acids (FISH OIL PO) Take 1 capsule by mouth daily.  . ondansetron (ZOFRAN ODT) 8 MG disintegrating tablet Take 1 tablet (8 mg total) by mouth every 8 (eight) hours as needed for nausea or vomiting.  . pantoprazole (PROTONIX) 40 MG tablet Take 1 tablet (40 mg total) by mouth daily.  . potassium chloride SA (KLOR-CON) 20 MEQ tablet Take 1 tablet (20 mEq total) by mouth 3 (three) times daily.  . Probiotic Product (PROBIOTIC-10 PO) Take by mouth as needed.   . Semaglutide,0.25 or 0.5MG /DOS, (OZEMPIC, 0.25 OR 0.5 MG/DOSE,) 2 MG/1.5ML SOPN Inject 0.5 mg into the skin once a week.  . TURMERIC PO Take 1 capsule by mouth daily.     Allergies:   Penicillins and Victoza [liraglutide]   Social History   Socioeconomic History  . Marital status: Single    Spouse name: Not on file  . Number of children: Not on file  . Years of education: Not on file  . Highest education level: Not on file  Occupational History  . Occupation: Pharmacist, hospital, [employer]  Tobacco Use  . Smoking status: Former Smoker    Packs/day: 1.00    Years: 15.00    Pack years: 15.00    Quit date: 09/03/2001    Years since quitting: 19.1  . Smokeless tobacco: Never Used  Vaping Use  . Vaping Use: Never used  Substance and Sexual Activity  . Alcohol use: Yes    Comment: rare  . Drug use: No  . Sexual activity: Yes    Birth control/protection: None  Other Topics Concern  . Not on file  Social History Narrative  . Not on file   Social Determinants of Health   Financial Resource Strain: Not on file  Food Insecurity: Not on file  Transportation Needs: Not on file  Physical Activity: Not on file  Stress: Not on file  Social Connections: Not on file     Family History: The patient's family history includes Cancer in her father; Heart attack (age of onset: 70) in her sister; Heart attack (age of onset: 74) in her mother; Heart disease in her mother; Hypertension  in her mother and another family member; Obesity in her mother; Prostate cancer in her father. There is no history of Colon cancer, Colon polyps, Esophageal cancer, Stomach cancer, or Rectal cancer.  ROS:   Review of Systems  Constitution: Negative for decreased appetite, fever and weight gain.  HENT: Negative for congestion, ear discharge, hoarse voice and sore throat.   Eyes: Negative for discharge, redness, vision loss in right eye and visual halos.  Cardiovascular: Negative for chest pain,  dyspnea on exertion, leg swelling, orthopnea and palpitations.  Respiratory: Negative for cough, hemoptysis, shortness of breath and snoring.   Endocrine: Negative for heat intolerance and polyphagia.  Hematologic/Lymphatic: Negative for bleeding problem. Does not bruise/bleed easily.  Skin: Negative for flushing, nail changes, rash and suspicious lesions.  Musculoskeletal: Negative for arthritis, joint pain, muscle cramps, myalgias, neck pain and stiffness.  Gastrointestinal: Negative for abdominal pain, bowel incontinence, diarrhea and excessive appetite.  Genitourinary: Negative for decreased libido, genital sores and incomplete emptying.  Neurological: Negative for brief paralysis, focal weakness, headaches and loss of balance.  Psychiatric/Behavioral: Negative for altered mental status, depression and suicidal ideas.  Allergic/Immunologic: Negative for HIV exposure and persistent infections.    EKGs/Labs/Other Studies Reviewed:    The following studies were reviewed today:   EKG:  The ekg ordered today demonstrates sinus rhythm, heart rate 98 bpm  Recent Labs: 03/21/2020: ALT 10; BUN 14; Creatinine, Ser 0.77; Hemoglobin 12.2; Platelets 281.0; Potassium 3.9; Sodium 138; TSH 1.76  Recent Lipid Panel    Component Value Date/Time   CHOL 106 03/21/2020 1007   CHOL 129 07/07/2019 0900   TRIG 65.0 03/21/2020 1007   HDL 33.00 (L) 03/21/2020 1007   HDL 33 (L) 07/07/2019 0900   CHOLHDL 3  03/21/2020 1007   VLDL 13.0 03/21/2020 1007   LDLCALC 60 03/21/2020 1007   LDLCALC 76 07/07/2019 0900    Physical Exam:    VS:  BP 140/86 (BP Location: Left Arm, Patient Position: Sitting, Cuff Size: Large)   Pulse 89   Ht 5\' 10"  (1.778 m)   Wt (!) 315 lb (142.9 kg)   SpO2 97%   BMI 45.20 kg/m     Wt Readings from Last 3 Encounters:  10/19/20 (!) 315 lb (142.9 kg)  10/06/20 (!) 309 lb (140.2 kg)  08/08/20 (!) 316 lb (143.3 kg)     GEN: Well nourished, well developed in no acute distress HEENT: Normal NECK: No JVD; No carotid bruits LYMPHATICS: No lymphadenopathy CARDIAC: S1S2 noted,RRR, no murmurs, rubs, gallops RESPIRATORY:  Clear to auscultation without rales, wheezing or rhonchi  ABDOMEN: Soft, non-tender, non-distended, +bowel sounds, no guarding. EXTREMITIES: No edema, No cyanosis, no clubbing MUSCULOSKELETAL:  No deformity  SKIN: Warm and dry NEUROLOGIC:  Alert and oriented x 3, non-focal PSYCHIATRIC:  Normal affect, good insight  ASSESSMENT:    1. Chest pain of uncertain etiology   2. Palpitations   3. PVC (premature ventricular contraction)   4. Chest pain, unspecified type   5. Type 2 diabetes mellitus with complication, without long-term current use of insulin ([place])   6. Morbid obesity ([place])   7. Mixed hyperlipidemia    PLAN:     Her chest pain is concerning patient does have intermediate risk for coronary artery disease would not like to do is pursue an ischemic evaluation in this patient.  Shared decision a coronary CTA at this time is appropriate.  I have discussed with the patient about the testing.  The patient has no IV contrast allergy and is agreeable to proceed with this test.  She has known PVCs and now she recently had significant palpitations despite her beta-blocker use.  We elected to place a monitor on the patient for 7 days understanding and making sure that she is not experiencing other arrhythmias.  Echocardiogram will also be done to  assess LV/RV function and any other structure abnormalities.  She has had a great deal of stress and feels this is worsening her symptoms, she would  like to have time all of her stressful environment to see if this is going to help improve her symptoms.  I do not think this is unreasonable so were going to have the patient out of work for 2 days and see if this helped her at all.  Blood work will be done today for BMP and mag  Hyperlipidemia - continue with current statin medication.  This is being managed by his primary care doctor.  No adjustments for antidiabetic medications were made today.  The patient understands the need to lose weight with diet and exercise. We have discussed specific strategies for this.  She follows with our healthy weight and wellness program.  S  The patient is in agreement with the above plan. The patient left the office in stable condition.  The patient will follow up in 2 months or sooner if needed.   Medication Adjustments/Labs and Tests Ordered: Current medicines are reviewed at length with the patient today.  Concerns regarding medicines are outlined above.  Orders Placed This Encounter  Procedures  . CT CORONARY MORPH W/CTA COR W/SCORE W/CA W/CM &/OR WO/CM  . CT CORONARY FRACTIONAL FLOW RESERVE DATA PREP  . CT CORONARY FRACTIONAL FLOW RESERVE FLUID ANALYSIS  . Basic metabolic panel  . Magnesium  . LONG TERM MONITOR (3-14 DAYS)  . EKG 12-Lead  . ECHOCARDIOGRAM COMPLETE   No orders of the defined types were placed in this encounter.   Patient Instructions   Medication Instructions:  Your physician recommends that you continue on your current medications as directed. Please refer to the Current Medication list given to you today.  *If you need a refill on your cardiac medications before your next appointment, please call your pharmacy*   Lab Work: Your physician recommends that you return for lab work: [place], [place]  If you have labs  (blood work) drawn today and your tests are completely normal, you will receive your results only by: [name] MyChart Message (if you have MyChart) OR . A paper copy in the mail If you have any lab test that is abnormal or we need to change your treatment, we will call you to review the results.   Testing/Procedures: Your physician has requested that you have an echocardiogram. Echocardiography is a painless test that uses sound waves to create images of your heart. It provides your doctor with information about the size and shape of your heart and how well your heart's chambers and valves are working. This procedure takes approximately one hour. There are no restrictions for this procedure.  Your cardiac CT will be scheduled at:  [facility] [address] [place], [place] [address] [phone]   If scheduled at [facility], please arrive at the [facility] main entrance (entrance A) of [facility] 30 minutes prior to test start time. Proceed to the [facility] Radiology Department (first floor) to check-in and test prep.  Please follow these instructions carefully (unless otherwise directed):   On the Night Before the Test: . Be sure to Drink plenty of water. . Do not consume any caffeinated/decaffeinated beverages or chocolate 12 hours prior to your test. . Do not take any antihistamines 12 hours prior to your test.   On the Day of the Test: . Drink plenty of water until 1 hour prior to the test. . Do not eat any food 4 hours prior to the test. . You may take your regular medications prior to the test.  .  Take metoprolol (Troprol-XL) two hours prior to test. . HOLD Hydrochlorothiazide morning of the test. . FEMALES- please wear underwire-free bra if available        After the Test: . Drink plenty of water. . After receiving IV contrast, you may experience a mild flushed feeling. This is normal. . On occasion, you may experience a mild rash up to 24  hours after the test. This is not dangerous. If this occurs, you can take Benadryl 25 mg and increase your fluid intake. . If you experience trouble breathing, this can be serious. If it is severe call 911 IMMEDIATELY. If it is mild, please call our office. . If you take any of these medications: Glipizide/Metformin, Avandament, Glucavance, please do not take 48 hours after completing test unless otherwise instructed.   Once we have confirmed authorization from your insurance company, we will call you to set up a date and time for your test. Based on how quickly your insurance processes prior authorizations requests, please allow up to 4 weeks to be contacted for scheduling your Cardiac CT appointment. Be advised that routine Cardiac CT appointments could be scheduled as many as 8 weeks after your provider has ordered it.  For non-scheduling related questions, please contact the cardiac imaging nurse navigator should you have any questions/concerns: Marchia Bond, Cardiac Imaging Nurse Navigator Gordy Clement, Cardiac Imaging Nurse Navigator [facility] Direct Office Dial: [phone]   For scheduling needs, including cancellations and rescheduling, please call [place], [phone].  A zio monitor was ordered today. It will remain on for 7 days. You will then return monitor and event diary in provided box. It takes 1-2 weeks for report to be downloaded and returned to [name]. We will call you with the results. If monitor falls off or has orange flashing light, please call Zio for further instructions.     Follow-Up: At [facility], you and your health needs are our priority.  As part of our continuing mission to provide you with exceptional heart care, we have created designated Provider Care Teams.  These Care Teams include your primary Cardiologist (physician) and Advanced Practice Providers (APPs -  Physician Assistants and Nurse Practitioners) who all work  together to provide you with the care you need, when you need it.  We recommend signing up for the patient portal called "MyChart".  Sign up information is provided on this After Visit Summary.  MyChart is used to connect with patients for Virtual Visits (Telemedicine).  Patients are able to view lab/test results, encounter notes, upcoming appointments, etc.  Non-urgent messages can be sent to your provider as well.   To learn more about what you can do with MyChart, go to [email or web address].    Your next appointment:   3 month(s)  The format for your next appointment:   In Person  Provider:   Berniece Salines, DO   Other Instructions  Echocardiogram An echocardiogram is a test that uses sound waves (ultrasound) to produce images of the heart. Images from an echocardiogram can provide important information about:  Heart size and shape.  The size and thickness and movement of your heart's walls.  Heart muscle function and strength.  Heart valve function or if you have stenosis. Stenosis is when the heart valves are too narrow.  If blood is flowing backward through the heart valves (regurgitation).  A tumor or infectious growth around the heart valves.  Areas of heart muscle that are not working well because of poor  blood flow or injury from a heart attack.  Aneurysm detection. An aneurysm is a weak or damaged part of an artery wall. The wall bulges out from the normal force of blood pumping through the body. Tell a health care provider about:  Any allergies you have.  All medicines you are taking, including vitamins, herbs, eye drops, creams, and over-the-counter medicines.  Any blood disorders you have.  Any surgeries you have had.  Any medical conditions you have.  Whether you are pregnant or may be pregnant. What are the risks? Generally, this is a safe test. However, problems may occur, including an allergic reaction to dye (contrast) that may be used during the  test. What happens before the test? No specific preparation is needed. You may eat and drink normally. What happens during the test?  You will take off your clothes from the waist up and put on a hospital gown.  Electrodes or electrocardiogram (ECG)patches may be placed on your chest. The electrodes or patches are then connected to a device that monitors your heart rate and rhythm.  You will lie down on a table for an ultrasound exam. A gel will be applied to your chest to help sound waves pass through your skin.  A handheld device, called a transducer, will be pressed against your chest and moved over your heart. The transducer produces sound waves that travel to your heart and bounce back (or "echo" back) to the transducer. These sound waves will be captured in real-time and changed into images of your heart that can be viewed on a video monitor. The images will be recorded on a computer and reviewed by your health care provider.  You may be asked to change positions or hold your breath for a short time. This makes it easier to get different views or better views of your heart.  In some cases, you may receive contrast through an IV in one of your veins. This can improve the quality of the pictures from your heart. The procedure may vary among health care providers and hospitals.   What can I expect after the test? You may return to your normal, everyday life, including diet, activities, and medicines, unless your health care provider tells you not to do that. Follow these instructions at home:  It is up to you to get the results of your test. Ask your health care provider, or the department that is doing the test, when your results will be ready.  Keep all follow-up visits. This is important. Summary  An echocardiogram is a test that uses sound waves (ultrasound) to produce images of the heart.  Images from an echocardiogram can provide important information about the size and shape of  your heart, heart muscle function, heart valve function, and other possible heart problems.  You do not need to do anything to prepare before this test. You may eat and drink normally.  After the echocardiogram is completed, you may return to your normal, everyday life, unless your health care provider tells you not to do that. This information is not intended to replace advice given to you by your health care provider. Make sure you discuss any questions you have with your health care provider. Document Revised: 04/12/2020 Document Reviewed: 04/12/2020 Elsevier Patient Education  2021 [place].      Adopting a Healthy Lifestyle.  Know what a healthy weight is for you (roughly BMI <25) and aim to maintain this   Aim for 7+ servings of fruits  and vegetables daily   65-80+ fluid ounces of water or unsweet tea for healthy kidneys   Limit to max 1 drink of alcohol per day; avoid smoking/tobacco   Limit animal fats in diet for cholesterol and heart health - choose grass fed whenever available   Avoid highly processed foods, and foods high in saturated/trans fats   Aim for low stress - take time to unwind and care for your mental health   Aim for 150 min of moderate intensity exercise weekly for heart health, and weights twice weekly for bone health   Aim for 7-9 hours of sleep daily   When it comes to diets, agreement about the perfect plan isnt easy to find, even among the experts. Experts at the [employer] developed an idea known as the Healthy Eating Plate. Just imagine a plate divided into logical, healthy portions.   The emphasis is on diet quality:   Load up on vegetables and fruits - one-half of your plate: Aim for color and variety, and remember that potatoes dont count.   Go for whole grains - one-quarter of your plate: Whole wheat, barley, wheat berries, quinoa, oats, brown rice, and foods made with them. If you want pasta, go with whole wheat  pasta.   Protein power - one-quarter of your plate: Fish, chicken, beans, and nuts are all healthy, versatile protein sources. Limit red meat.   The diet, however, does go beyond the plate, offering a few other suggestions.   Use healthy plant oils, such as olive, canola, soy, corn, sunflower and peanut. Check the labels, and avoid partially hydrogenated oil, which have unhealthy trans fats.   If youre thirsty, drink water. Coffee and tea are good in moderation, but skip sugary drinks and limit milk and dairy products to one or two daily servings.   The type of carbohydrate in the diet is more important than the amount. Some sources of carbohydrates, such as vegetables, fruits, whole grains, and beans-are healthier than others.   Finally, stay active  Signed, Berniece Salines, DO  10/19/2020 5:14 PM    [facility]

## 2020-10-19 NOTE — Patient Instructions (Signed)
Medication Instructions:  Your physician recommends that you continue on your current medications as directed. Please refer to the Current Medication list given to you today.  *If you need a refill on your cardiac medications before your next appointment, please call your pharmacy*   Lab Work: Your physician recommends that you return for lab work: [place], [place]  If you have labs (blood work) drawn today and your tests are completely normal, you will receive your results only by: [name] MyChart Message (if you have MyChart) OR . A paper copy in the mail If you have any lab test that is abnormal or we need to change your treatment, we will call you to review the results.   Testing/Procedures: Your physician has requested that you have an echocardiogram. Echocardiography is a painless test that uses sound waves to create images of your heart. It provides your doctor with information about the size and shape of your heart and how well your heart's chambers and valves are working. This procedure takes approximately one hour. There are no restrictions for this procedure.  Your cardiac CT will be scheduled at:  [facility] [address] [place], [place] [address] [phone]   If scheduled at [facility], please arrive at the [facility] main entrance (entrance A) of [facility] 30 minutes prior to test start time. Proceed to the [facility] Radiology Department (first floor) to check-in and test prep.  Please follow these instructions carefully (unless otherwise directed):   On the Night Before the Test: . Be sure to Drink plenty of water. . Do not consume any caffeinated/decaffeinated beverages or chocolate 12 hours prior to your test. . Do not take any antihistamines 12 hours prior to your test.   On the Day of the Test: . Drink plenty of water until 1 hour prior to the test. . Do not eat any food 4 hours prior to the test. . You may take your  regular medications prior to the test.  . Take metoprolol (Troprol-XL) two hours prior to test. . HOLD Hydrochlorothiazide morning of the test. . FEMALES- please wear underwire-free bra if available        After the Test: . Drink plenty of water. . After receiving IV contrast, you may experience a mild flushed feeling. This is normal. . On occasion, you may experience a mild rash up to 24 hours after the test. This is not dangerous. If this occurs, you can take Benadryl 25 mg and increase your fluid intake. . If you experience trouble breathing, this can be serious. If it is severe call 911 IMMEDIATELY. If it is mild, please call our office. . If you take any of these medications: Glipizide/Metformin, Avandament, Glucavance, please do not take 48 hours after completing test unless otherwise instructed.   Once we have confirmed authorization from your insurance company, we will call you to set up a date and time for your test. Based on how quickly your insurance processes prior authorizations requests, please allow up to 4 weeks to be contacted for scheduling your Cardiac CT appointment. Be advised that routine Cardiac CT appointments could be scheduled as many as 8 weeks after your provider has ordered it.  For non-scheduling related questions, please contact the cardiac imaging nurse navigator should you have any questions/concerns: Marchia Bond, Cardiac Imaging Nurse Navigator Gordy Clement, Cardiac Imaging Nurse Navigator [facility] Direct Office Dial: [phone]   For scheduling needs, including cancellations and  rescheduling, please call [place], [phone].  A zio monitor was ordered today. It will remain on for 7 days. You will then return monitor and event diary in provided box. It takes 1-2 weeks for report to be downloaded and returned to [name]. We will call you with the results. If monitor falls off or has orange flashing light, please call Zio for  further instructions.     Follow-Up: At [facility], you and your health needs are our priority.  As part of our continuing mission to provide you with exceptional heart care, we have created designated Provider Care Teams.  These Care Teams include your primary Cardiologist (physician) and Advanced Practice Providers (APPs -  Physician Assistants and Nurse Practitioners) who all work together to provide you with the care you need, when you need it.  We recommend signing up for the patient portal called "MyChart".  Sign up information is provided on this After Visit Summary.  MyChart is used to connect with patients for Virtual Visits (Telemedicine).  Patients are able to view lab/test results, encounter notes, upcoming appointments, etc.  Non-urgent messages can be sent to your provider as well.   To learn more about what you can do with MyChart, go to [email or web address].    Your next appointment:   3 month(s)  The format for your next appointment:   In Person  Provider:   Berniece Salines, DO   Other Instructions  Echocardiogram An echocardiogram is a test that uses sound waves (ultrasound) to produce images of the heart. Images from an echocardiogram can provide important information about:  Heart size and shape.  The size and thickness and movement of your heart's walls.  Heart muscle function and strength.  Heart valve function or if you have stenosis. Stenosis is when the heart valves are too narrow.  If blood is flowing backward through the heart valves (regurgitation).  A tumor or infectious growth around the heart valves.  Areas of heart muscle that are not working well because of poor blood flow or injury from a heart attack.  Aneurysm detection. An aneurysm is a weak or damaged part of an artery wall. The wall bulges out from the normal force of blood pumping through the body. Tell a health care provider about:  Any allergies you have.  All medicines you are  taking, including vitamins, herbs, eye drops, creams, and over-the-counter medicines.  Any blood disorders you have.  Any surgeries you have had.  Any medical conditions you have.  Whether you are pregnant or may be pregnant. What are the risks? Generally, this is a safe test. However, problems may occur, including an allergic reaction to dye (contrast) that may be used during the test. What happens before the test? No specific preparation is needed. You may eat and drink normally. What happens during the test?  You will take off your clothes from the waist up and put on a hospital gown.  Electrodes or electrocardiogram (ECG)patches may be placed on your chest. The electrodes or patches are then connected to a device that monitors your heart rate and rhythm.  You will lie down on a table for an ultrasound exam. A gel will be applied to your chest to help sound waves pass through your skin.  A handheld device, called a transducer, will be pressed against your chest and moved over your heart. The transducer produces sound waves that travel to your heart and bounce back (or "echo" back) to the transducer. These sound waves will  be captured in real-time and changed into images of your heart that can be viewed on a video monitor. The images will be recorded on a computer and reviewed by your health care provider.  You may be asked to change positions or hold your breath for a short time. This makes it easier to get different views or better views of your heart.  In some cases, you may receive contrast through an IV in one of your veins. This can improve the quality of the pictures from your heart. The procedure may vary among health care providers and hospitals.   What can I expect after the test? You may return to your normal, everyday life, including diet, activities, and medicines, unless your health care provider tells you not to do that. Follow these instructions at home:  It is up to  you to get the results of your test. Ask your health care provider, or the department that is doing the test, when your results will be ready.  Keep all follow-up visits. This is important. Summary  An echocardiogram is a test that uses sound waves (ultrasound) to produce images of the heart.  Images from an echocardiogram can provide important information about the size and shape of your heart, heart muscle function, heart valve function, and other possible heart problems.  You do not need to do anything to prepare before this test. You may eat and drink normally.  After the echocardiogram is completed, you may return to your normal, everyday life, unless your health care provider tells you not to do that. This information is not intended to replace advice given to you by your health care provider. Make sure you discuss any questions you have with your health care provider. Document Revised: 04/12/2020 Document Reviewed: 04/12/2020 Elsevier Patient Education  2021 [employer].

## 2020-10-21 LAB — BASIC METABOLIC PANEL
BUN/Creatinine Ratio: 13 (ref 9–23)
BUN: 8 mg/dL (ref 6–24)
CO2: 21 mmol/L (ref 20–29)
Calcium: 9 mg/dL (ref 8.7–10.2)
Chloride: 102 mmol/L (ref 96–106)
Creatinine, Ser: 0.64 mg/dL (ref 0.57–1.00)
GFR calc Af Amer: 119 mL/min/{1.73_m2} (ref >59)
GFR calc non Af Amer: 103 mL/min/{1.73_m2} (ref >59)
Glucose: 87 mg/dL (ref 65–99)
Potassium: 3.7 mmol/L (ref 3.5–5.2)
Sodium: 139 mmol/L (ref 134–144)

## 2020-10-21 LAB — MAGNESIUM: Magnesium: 1.9 mg/dL (ref 1.6–2.3)

## 2020-10-27 ENCOUNTER — Telehealth (HOSPITAL_COMMUNITY): Payer: Self-pay | Admitting: Emergency Medicine

## 2020-10-27 ENCOUNTER — Ambulatory Visit (INDEPENDENT_AMBULATORY_CARE_PROVIDER_SITE_OTHER): Payer: BC Managed Care – PPO | Admitting: Family Medicine

## 2020-10-27 NOTE — Telephone Encounter (Signed)
Attempted to call patient regarding upcoming cardiac CT appointment. °Left message on voicemail with name and callback number °Sara Wallace RN Navigator Cardiac Imaging °[facility] °[phone] Office °[phone] Cell ° °

## 2020-10-28 ENCOUNTER — Ambulatory Visit (HOSPITAL_COMMUNITY)
Admission: RE | Admit: 2020-10-28 | Discharge: 2020-10-28 | Disposition: A | Discharge status: Home / Self Care | Payer: BC Managed Care – PPO | Source: Ambulatory Visit | Attending: Cardiology | Admitting: Cardiology

## 2020-10-28 ENCOUNTER — Encounter (HOSPITAL_COMMUNITY): Payer: Self-pay

## 2020-10-28 ENCOUNTER — Other Ambulatory Visit: Payer: Self-pay

## 2020-10-28 DIAGNOSIS — R079 Chest pain, unspecified: Secondary | ICD-10-CM | POA: Insufficient documentation

## 2020-10-28 DIAGNOSIS — Z006 Encounter for examination for normal comparison and control in clinical research program: Secondary | ICD-10-CM

## 2020-10-28 MED ORDER — DILTIAZEM HCL 25 MG/5ML IV SOLN
INTRAVENOUS | Status: AC
  Filled 2020-10-28: qty 5

## 2020-10-28 MED ORDER — DILTIAZEM HCL 25 MG/5ML IV SOLN
INTRAVENOUS | Status: AC
  Administered 2020-10-28: 5 mg via INTRAVENOUS
  Filled 2020-10-28: qty 5

## 2020-10-28 MED ORDER — METOPROLOL TARTRATE 5 MG/5ML IV SOLN
5.0000 mg | INTRAVENOUS | Status: DC | PRN
  Administered 2020-10-28: 5 mg via INTRAVENOUS

## 2020-10-28 MED ORDER — DILTIAZEM HCL 25 MG/5ML IV SOLN: 5.0000 mg | Freq: Once | INTRAVENOUS | Status: AC

## 2020-10-28 MED ORDER — NITROGLYCERIN 0.4 MG SL SUBL: 0.8000 mg | SUBLINGUAL_TABLET | Freq: Once | SUBLINGUAL | Status: DC

## 2020-10-28 MED ORDER — METOPROLOL TARTRATE 5 MG/5ML IV SOLN
INTRAVENOUS | Status: AC
  Administered 2020-10-28: 5 mg via INTRAVENOUS
  Filled 2020-10-28: qty 20

## 2020-10-28 NOTE — Progress Notes (Addendum)
Patient here for CT heart. On arrival to CT patient heart rate 100. IV metoprolol and IV Cardizem given per protocol without any change in heart rate. Dr. Harriet Masson informed of heart rate and medications given and decision made to reschedule the patient. Patient made aware of cancellation and states understanding. Patient informed that CT heart navigator nurse would reach out to her for rescheduling instructions. Marchia Bond, CT heart nurse made aware.   Patient can be reached at [phone]

## 2020-10-28 NOTE — Research (Signed)
IDENTIFY Informed Consent                  Subject Name: Brandi Lester    Subject met inclusion and exclusion criteria.  The informed consent form, study requirements and expectations were reviewed with the subject and questions and concerns were addressed prior to the signing of the consent form.  The subject verbalized understanding of the trial requirements.  The subject agreed to participate in the IDENTIFY trial and signed the informed consent at 15:52PM on 10/28/20.  The informed consent was obtained prior to performance of any protocol-specific procedures for the subject.  A copy of the signed informed consent was given to the subject and a copy was placed in the subject's medical record.   Meade Maw, Naval architect

## 2020-10-31 MED FILL — PANTOPRAZOLE SOD DR 40 MG T: 40 | 90 days supply | Qty: 90 | Fill #1

## 2020-11-01 ENCOUNTER — Telehealth: Payer: Self-pay

## 2020-11-01 ENCOUNTER — Other Ambulatory Visit (HOSPITAL_COMMUNITY): Payer: Self-pay | Admitting: Emergency Medicine

## 2020-11-01 DIAGNOSIS — R079 Chest pain, unspecified: Secondary | ICD-10-CM

## 2020-11-01 MED ORDER — IVABRADINE HCL 5 MG PO TABS: 5.0000 mg | ORAL_TABLET | Freq: Two times a day (BID) | ORAL | 0 refills | Status: DC

## 2020-11-01 MED ORDER — IVABRADINE HCL 5 MG PO TABS: 5.0000 mg | ORAL_TABLET | Freq: Once | ORAL | 0 refills | Status: DC

## 2020-11-01 NOTE — Telephone Encounter (Signed)
Most likely PA for Corlanor will not be approved because of the reason not supported by insurance criteria. We ran out of samples. Left a message to return my call.

## 2020-11-01 NOTE — Progress Notes (Signed)
5mg  ivabradine ordered for HR control for next cardiac CTA attempt. (however will attempt to obtain office sample)  Marchia Bond RN Navigator Cardiac Imaging [facility] [phone] Office  [phone] Cell

## 2020-11-01 NOTE — Addendum Note (Signed)
Addended by: Marcelle Overlie D on: 11/01/2020 02:31 PM   Modules accepted: Orders

## 2020-11-02 ENCOUNTER — Telehealth: Payer: Self-pay

## 2020-11-02 ENCOUNTER — Telehealth (HOSPITAL_COMMUNITY): Payer: Self-pay | Admitting: Emergency Medicine

## 2020-11-02 NOTE — Telephone Encounter (Signed)
PA started on CMM for Corlanor 5 mg. Key [id number]

## 2020-11-02 NOTE — Telephone Encounter (Signed)
Phone call to patient to discuss next CCTA appt on Monday 3/7 and that we would like her to take Ivabradine for HR control. I explained that this medication sample is available at the [address] [facility] office check in. Pt verbalized understanding  Marchia Bond RN Navigator Cardiac Imaging [facility] [phone] Office  [phone] Cell

## 2020-11-03 ENCOUNTER — Other Ambulatory Visit (INDEPENDENT_AMBULATORY_CARE_PROVIDER_SITE_OTHER): Payer: Self-pay | Admitting: Family Medicine

## 2020-11-03 ENCOUNTER — Other Ambulatory Visit: Payer: Self-pay

## 2020-11-03 ENCOUNTER — Encounter (INDEPENDENT_AMBULATORY_CARE_PROVIDER_SITE_OTHER): Payer: Self-pay | Admitting: Family Medicine

## 2020-11-03 ENCOUNTER — Ambulatory Visit (INDEPENDENT_AMBULATORY_CARE_PROVIDER_SITE_OTHER): Payer: BC Managed Care – PPO | Admitting: Family Medicine

## 2020-11-03 VITALS — BP 135/80 | HR 88 | Temp 98.2°F | Ht 70.0 in | Wt 308.0 lb

## 2020-11-03 DIAGNOSIS — Z6841 Body Mass Index (BMI) 40.0 and over, adult: Secondary | ICD-10-CM

## 2020-11-03 DIAGNOSIS — E1159 Type 2 diabetes mellitus with other circulatory complications: Secondary | ICD-10-CM | POA: Diagnosis not present

## 2020-11-03 DIAGNOSIS — F3289 Other specified depressive episodes: Secondary | ICD-10-CM

## 2020-11-03 DIAGNOSIS — E785 Hyperlipidemia, unspecified: Secondary | ICD-10-CM

## 2020-11-03 DIAGNOSIS — I152 Hypertension secondary to endocrine disorders: Secondary | ICD-10-CM

## 2020-11-03 DIAGNOSIS — E1169 Type 2 diabetes mellitus with other specified complication: Secondary | ICD-10-CM

## 2020-11-03 MED ORDER — METOPROLOL SUCCINATE ER 50 MG PO TB24
50.0000 mg | ORAL_TABLET | Freq: Every day | ORAL | 0 refills | Status: DC
Start: 2020-11-03 — End: 2020-11-03

## 2020-11-03 MED ORDER — ATORVASTATIN CALCIUM 10 MG PO TABS: 10.0000 mg | ORAL_TABLET | Freq: Every day | ORAL | 0 refills | Status: DC

## 2020-11-03 MED ORDER — BUPROPION HCL ER (SR) 150 MG PO TB12: 150.0000 mg | ORAL_TABLET | Freq: Every morning | ORAL | 0 refills | Status: DC

## 2020-11-03 MED FILL — METOPROLOL SUCCINATE ER 50: 50 | 90 days supply | Qty: 90 | Fill #0

## 2020-11-03 MED FILL — ATORVASTATIN CALCIUM 10 MG: 10 | 90 days supply | Qty: 90 | Fill #0

## 2020-11-07 ENCOUNTER — Ambulatory Visit (HOSPITAL_COMMUNITY)
Admission: RE | Admit: 2020-11-07 | Discharge: 2020-11-07 | Disposition: A | Discharge status: Home / Self Care | Payer: BC Managed Care – PPO | Source: Ambulatory Visit | Attending: Cardiology | Admitting: Cardiology

## 2020-11-07 ENCOUNTER — Encounter (INDEPENDENT_AMBULATORY_CARE_PROVIDER_SITE_OTHER): Payer: Self-pay | Admitting: Family Medicine

## 2020-11-07 ENCOUNTER — Other Ambulatory Visit: Payer: Self-pay

## 2020-11-07 DIAGNOSIS — R079 Chest pain, unspecified: Secondary | ICD-10-CM | POA: Insufficient documentation

## 2020-11-07 MED ORDER — DILTIAZEM HCL 25 MG/5ML IV SOLN
INTRAVENOUS | Status: AC
  Filled 2020-11-07: qty 5

## 2020-11-07 MED ORDER — IOHEXOL 350 MG/ML SOLN
80.0000 mL | Freq: Once | INTRAVENOUS | Status: AC | PRN
  Administered 2020-11-07: 80 mL via INTRAVENOUS

## 2020-11-07 MED ORDER — NITROGLYCERIN 0.4 MG SL SUBL
0.8000 mg | SUBLINGUAL_TABLET | Freq: Once | SUBLINGUAL | Status: AC
  Administered 2020-11-07: 0.8 mg via SUBLINGUAL

## 2020-11-07 MED ORDER — NITROGLYCERIN 0.4 MG SL SUBL
SUBLINGUAL_TABLET | SUBLINGUAL | Status: AC
  Filled 2020-11-07: qty 2

## 2020-11-07 MED ORDER — DILTIAZEM HCL 25 MG/5ML IV SOLN
5.0000 mg | Freq: Once | INTRAVENOUS | Status: AC
  Administered 2020-11-07: 5 mg via INTRAVENOUS
  Filled 2020-11-07: qty 5

## 2020-11-07 NOTE — Progress Notes (Signed)
Chief Complaint:   OBESITY Brandi Lester is here to discuss her progress with her obesity treatment plan along with follow-up of her obesity related diagnoses. Brandi Lester is on the Category 4 Plan and states she is following her eating plan approximately 75% of the time. Brandi Lester states she is not exercising at this time.  Today's visit was #: 73 Starting weight: 325 lbs Starting date: 05/23/2017 Today's weight: 308 lbs Today's date: 11/03/2020 Total lbs lost to date: 17 lbs Total lbs lost since last in-office visit: 1 lb  Interim History: Brandi Lester does not always get her protein in.  She is missing lunch quite often.  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 She is currently undergoing cardiology evaluation and will have a cardiac cath and echo.   Subjective:   1. Hypertension associated with type 2 diabetes mellitus ([place]) Well controlled with metoprolol, amlodipine, HCTZ, losartan.  BP Readings from Last 3 Encounters:  11/03/20 135/80  10/28/20 (!) 156/93  10/19/20 140/86   2. Hyperlipidemia associated with type 2 diabetes mellitus (HCC) TG and LDL at goal.  HDL low at 33.  On atorvastatin 10 mg daily.  Lab Results  Component Value Date   ALT 10 03/21/2020   AST 8 03/21/2020   ALKPHOS 68 03/21/2020   BILITOT 0.3 03/21/2020   Lab Results  Component Value Date   CHOL 106 03/21/2020   HDL 33.00 (L) 03/21/2020   LDLCALC 60 03/21/2020   TRIG 65.0 03/21/2020   CHOLHDL 3 03/21/2020   3. Type 2 diabetes mellitus with other specified complication, without long-term current use of insulin (HCC) Well controlled on Ozempic and metformin.  Lab Results  Component Value Date   HGBA1C 6.1 03/21/2020   HGBA1C 5.9 (H) 12/01/2019   HGBA1C 5.9 (H) 07/07/2019   Lab Results  Component Value Date   MICROALBUR 0.50 12/15/2012   LDLCALC 60 03/21/2020   CREATININE 0.64 10/20/2020   Lab Results  Component Value Date   INSULIN 20.7 12/01/2019   INSULIN 12.9 07/07/2019   INSULIN  21.2 11/20/2018   INSULIN 15.4 07/14/2018   INSULIN 12.9 09/12/2017   4. Other depression, with emotional eating Mood stable. Cravings well controlled.  Assessment/Plan:   1. Hypertension associated with type 2 diabetes mellitus (HCC) Will refill metoprolol today, as per below.  - Refill metoprolol succinate (TOPROL-XL) 50 MG 24 hr tablet; Take 1 tablet (50 mg total) by mouth daily. Take with or immediately following a meal.  Dispense: 90 tablet; Refill: 0  2. Hyperlipidemia associated with type 2 diabetes mellitus ([place]) Refill for atorvastatin sent to pharmacy today.  See below for prescription information.  - Refill atorvastatin (LIPITOR) 10 MG tablet; Take 1 tablet (10 mg total) by mouth daily.  Dispense: 90 tablet; Refill: 0  3. Type 2 diabetes mellitus with other specified complication, without long-term current use of insulin (HCC) Continue Ozempic at 0.25 mg subcutaneously weekly plus 10 clicks.  4. Other depression, with emotional eating Will refill bupropion today, as per below.  - Refill buPROPion (WELLBUTRIN SR) 150 MG 12 hr tablet; Take 1 tablet (150 mg total) by mouth in the morning.  Dispense: 90 tablet; Refill: 0  5. Class 3 severe obesity with serious comorbidity and body mass index (BMI) of 40.0 to 44.9 in adult, unspecified obesity type (HCC)  Brandi Lester is currently in the action stage of change. As such, her goal is to continue with weight loss efforts. She has agreed to the Category 4  Plan.   Track calories for a few days.  She is likely eating too few calories.  Exercise goals: No exercise has been prescribed at this time.  Behavioral modification strategies: increasing lean protein intake and no skipping meals.  Brandi Lester has agreed to follow-up with our clinic in 3 weeks. She was informed of the importance of frequent follow-up visits to maximize her success with intensive lifestyle modifications for her multiple health conditions.   Objective:   Blood  pressure 135/80, pulse 88, temperature 98.2 F (36.8 C), height 5\' 10"  (1.778 m), weight (!) 308 lb (139.7 kg), SpO2 98 %. Body mass index is 44.19 kg/m.  General: Cooperative, alert, well developed, in no acute distress. HEENT: Conjunctivae and lids unremarkable. Cardiovascular: Regular rhythm.  Lungs: Normal work of breathing. Neurologic: No focal deficits.   Lab Results  Component Value Date   CREATININE 0.64 10/20/2020   BUN 8 10/20/2020   NA 139 10/20/2020   K 3.7 10/20/2020   CL 102 10/20/2020   CO2 21 10/20/2020   Lab Results  Component Value Date   ALT 10 03/21/2020   AST 8 03/21/2020   ALKPHOS 68 03/21/2020   BILITOT 0.3 03/21/2020   Lab Results  Component Value Date   HGBA1C 6.1 03/21/2020   HGBA1C 5.9 (H) 12/01/2019   HGBA1C 5.9 (H) 07/07/2019   HGBA1C 6.1 (H) 11/20/2018   HGBA1C 5.9 (H) 07/14/2018   Lab Results  Component Value Date   INSULIN 20.7 12/01/2019   INSULIN 12.9 07/07/2019   INSULIN 21.2 11/20/2018   INSULIN 15.4 07/14/2018   INSULIN 12.9 09/12/2017   Lab Results  Component Value Date   TSH 1.76 03/21/2020   Lab Results  Component Value Date   CHOL 106 03/21/2020   HDL 33.00 (L) 03/21/2020   LDLCALC 60 03/21/2020   TRIG 65.0 03/21/2020   CHOLHDL 3 03/21/2020   Lab Results  Component Value Date   WBC 7.9 03/21/2020   HGB 12.2 03/21/2020   HCT 36.4 03/21/2020   MCV 84.5 03/21/2020   PLT 281.0 03/21/2020   Attestation Statements:   Reviewed by clinician on day of visit: allergies, medications, problem list, medical history, surgical history, family history, social history, and previous encounter notes.  I, Water quality scientist, CMA, am acting as Location manager for [employer], [place].  I have reviewed the above documentation for accuracy and completeness, and I agree with the above. -  Georgianne Fick, FNP

## 2020-11-10 NOTE — Telephone Encounter (Signed)
Denied on CMM. You do not meet the requirements of your plan. Your plan covers this drug when you meet all of these conditions: - You are an adult - You have symptomatic heart failure - You are using it to reduce your risk of being hospitalized for worsening heart failure

## 2020-11-14 ENCOUNTER — Other Ambulatory Visit: Payer: Self-pay

## 2020-11-14 ENCOUNTER — Ambulatory Visit (HOSPITAL_BASED_OUTPATIENT_CLINIC_OR_DEPARTMENT_OTHER)
Admission: RE | Admit: 2020-11-14 | Discharge: 2020-11-14 | Disposition: A | Discharge status: Home / Self Care | Payer: BC Managed Care – PPO | Source: Ambulatory Visit | Attending: Cardiology | Admitting: Cardiology

## 2020-11-14 DIAGNOSIS — R079 Chest pain, unspecified: Secondary | ICD-10-CM | POA: Diagnosis present

## 2020-11-14 LAB — ECHOCARDIOGRAM COMPLETE
Area-P 1/2: 2.26 cm2
S' Lateral: 2.65 cm

## 2020-11-24 ENCOUNTER — Ambulatory Visit (INDEPENDENT_AMBULATORY_CARE_PROVIDER_SITE_OTHER): Payer: BC Managed Care – PPO | Admitting: Family Medicine

## 2020-11-24 ENCOUNTER — Other Ambulatory Visit: Payer: Self-pay

## 2020-11-24 ENCOUNTER — Other Ambulatory Visit (INDEPENDENT_AMBULATORY_CARE_PROVIDER_SITE_OTHER): Payer: Self-pay | Admitting: Family Medicine

## 2020-11-24 ENCOUNTER — Encounter (INDEPENDENT_AMBULATORY_CARE_PROVIDER_SITE_OTHER): Payer: Self-pay | Admitting: Family Medicine

## 2020-11-24 VITALS — BP 137/69 | HR 82 | Temp 98.4°F | Ht 70.0 in | Wt 309.0 lb

## 2020-11-24 DIAGNOSIS — R002 Palpitations: Secondary | ICD-10-CM

## 2020-11-24 DIAGNOSIS — E1169 Type 2 diabetes mellitus with other specified complication: Secondary | ICD-10-CM | POA: Diagnosis not present

## 2020-11-24 DIAGNOSIS — Z9189 Other specified personal risk factors, not elsewhere classified: Secondary | ICD-10-CM | POA: Diagnosis not present

## 2020-11-24 DIAGNOSIS — Z6841 Body Mass Index (BMI) 40.0 and over, adult: Secondary | ICD-10-CM | POA: Diagnosis not present

## 2020-11-24 MED ORDER — OZEMPIC (0.25 OR 0.5 MG/DOSE) 2 MG/1.5ML ~~LOC~~ SOPN
0.5000 mg | PEN_INJECTOR | SUBCUTANEOUS | 0 refills | Status: DC
Start: 2020-11-24 — End: 2020-11-24

## 2020-11-24 MED ORDER — METFORMIN HCL 500 MG PO TABS: 500.0000 mg | ORAL_TABLET | Freq: Two times a day (BID) | ORAL | 0 refills | Status: DC

## 2020-11-24 MED FILL — OZEMPIC 0.25 OR 0.5 MG/DOSE: 2 | 28 days supply | Qty: 2 | Fill #0

## 2020-11-24 MED FILL — METFORMIN HCL 500 MG TABS: 500 | 90 days supply | Qty: 180 | Fill #0

## 2020-11-28 ENCOUNTER — Other Ambulatory Visit (INDEPENDENT_AMBULATORY_CARE_PROVIDER_SITE_OTHER): Payer: BC Managed Care – PPO

## 2020-11-28 ENCOUNTER — Encounter (INDEPENDENT_AMBULATORY_CARE_PROVIDER_SITE_OTHER): Payer: Self-pay | Admitting: Family Medicine

## 2020-11-28 ENCOUNTER — Other Ambulatory Visit: Payer: BC Managed Care – PPO

## 2020-11-28 ENCOUNTER — Other Ambulatory Visit: Payer: Self-pay

## 2020-11-28 DIAGNOSIS — E785 Hyperlipidemia, unspecified: Secondary | ICD-10-CM

## 2020-11-28 DIAGNOSIS — R7989 Other specified abnormal findings of blood chemistry: Secondary | ICD-10-CM

## 2020-11-28 DIAGNOSIS — E1169 Type 2 diabetes mellitus with other specified complication: Secondary | ICD-10-CM

## 2020-11-28 DIAGNOSIS — E1159 Type 2 diabetes mellitus with other circulatory complications: Secondary | ICD-10-CM

## 2020-11-28 DIAGNOSIS — I152 Hypertension secondary to endocrine disorders: Secondary | ICD-10-CM

## 2020-11-28 DIAGNOSIS — E559 Vitamin D deficiency, unspecified: Secondary | ICD-10-CM

## 2020-11-28 NOTE — Progress Notes (Addendum)
Chief Complaint:   OBESITY Brandi Lester is here to discuss her progress with her obesity treatment plan along with follow-up of her obesity related diagnoses. Brandi Lester is on the Category 4 Plan and states she is following her eating plan approximately 75% of the time. Brandi Lester states she is walking for 45 minutes 3 times per week.  Today's visit was #: 56 Starting weight: 325 lbs Starting date: 05/23/2017 Today's weight: 309 lbs Today's date: 11/24/2020 Total lbs lost to date: 16 Total lbs lost since last in-office visit: +1  Interim History: Brandi Lester feels she is eating slightly larger portions than she was a few months ago. She has deviated from the plan a few times, but she has done well overall. She has started back to exercise since her cardiac workup has been normal.  Subjective:   1. Type 2 diabetes mellitus with other specified complication, without long-term current use of insulin (HCC) [name]'s A1c is well controlled at 6.1. She is on Ozempic 0.375 mg and she notes occasional nausea.  Lab Results  Component Value Date   HGBA1C 6.1 03/21/2020   HGBA1C 5.9 (H) 12/01/2019   HGBA1C 5.9 (H) 07/07/2019   Lab Results  Component Value Date   MICROALBUR 0.50 12/15/2012   LDLCALC 60 03/21/2020   CREATININE 0.64 10/20/2020   Lab Results  Component Value Date   INSULIN 20.7 12/01/2019   INSULIN 12.9 07/07/2019   INSULIN 21.2 11/20/2018   INSULIN 15.4 07/14/2018   INSULIN 12.9 09/12/2017   2. Palpitation Graylyn has undergone a cardiac workup recently (echo, Zio patch, and coronary CT).  3. At risk for side effect of medication Brandi Lester is at risk for drug side effects due to increased dose of Ozempic.  Assessment/Plan:   1. Type 2 diabetes mellitus with other specified complication, without long-term current use of insulin (HCC)  Brandi Lester agreed to increase Ozempic to 0.5 mg weekly with no refills, and we will refill metformin BID for 90 days with no refills.  -  Semaglutide,0.25 or 0.5MG /DOS, (OZEMPIC, 0.25 OR 0.5 MG/DOSE,) 2 MG/1.5ML SOPN; Inject 0.5 mg into the skin once a week.  Dispense: 1.5 mL; Refill: 0 - metFORMIN (GLUCOPHAGE) 500 MG tablet; Take 1 tablet (500 mg total) by mouth 2 (two) times daily with a meal.  Dispense: 180 tablet; Refill: 0  2. Palpitation We discussed Brandi Lester's results of these tests which look to be fairly normal, except for some runs of Vtach.  3. At risk for side effect of medication Brandi Lester was given approximately 15 minutes of drug side effect counseling today.  We discussed side effect possibility and risk versus benefits. Brandi Lester agreed to the medication and will contact this office if these side effects are intolerable.  Repetitive spaced learning was employed today to elicit superior memory formation and behavioral change.  4. Obesity: BMI 53 Brandi Lester is currently in the action stage of change. As such, her goal is to continue with weight loss efforts. She has agreed to the Category 4 Plan.   Exercise goals: All adults should avoid inactivity. Some physical activity is better than none, and adults who participate in any amount of physical activity gain some health benefits.  Behavioral modification strategies: decreasing simple carbohydrates.  Brandi Lester has agreed to follow-up with our clinic in 3 to 4 weeks.  Objective:   Blood pressure 137/69, pulse 82, temperature 98.4 F (36.9 C), height 5\' 10"  (1.778 m), weight (!) 309 lb (140.2 kg), SpO2 99 %. Body mass index is 44.34  kg/m.  General: Cooperative, alert, well developed, in no acute distress. HEENT: Conjunctivae and lids unremarkable. Cardiovascular: Regular rhythm.  Lungs: Normal work of breathing. Neurologic: No focal deficits.   Lab Results  Component Value Date   CREATININE 0.64 10/20/2020   BUN 8 10/20/2020   NA 139 10/20/2020   K 3.7 10/20/2020   CL 102 10/20/2020   CO2 21 10/20/2020   Lab Results  Component Value Date   ALT 10  03/21/2020   AST 8 03/21/2020   ALKPHOS 68 03/21/2020   BILITOT 0.3 03/21/2020   Lab Results  Component Value Date   HGBA1C 6.1 03/21/2020   HGBA1C 5.9 (H) 12/01/2019   HGBA1C 5.9 (H) 07/07/2019   HGBA1C 6.1 (H) 11/20/2018   HGBA1C 5.9 (H) 07/14/2018   Lab Results  Component Value Date   INSULIN 20.7 12/01/2019   INSULIN 12.9 07/07/2019   INSULIN 21.2 11/20/2018   INSULIN 15.4 07/14/2018   INSULIN 12.9 09/12/2017   Lab Results  Component Value Date   TSH 1.76 03/21/2020   Lab Results  Component Value Date   CHOL 106 03/21/2020   HDL 33.00 (L) 03/21/2020   LDLCALC 60 03/21/2020   TRIG 65.0 03/21/2020   CHOLHDL 3 03/21/2020   Lab Results  Component Value Date   WBC 7.9 03/21/2020   HGB 12.2 03/21/2020   HCT 36.4 03/21/2020   MCV 84.5 03/21/2020   PLT 281.0 03/21/2020   No results found for: IRON, TIBC, FERRITIN  Attestation Statements:   Reviewed by clinician on day of visit: allergies, medications, problem list, medical history, surgical history, family history, social history, and previous encounter notes.   Wilhemena Durie, am acting as Location manager for [employer], FNP-C.  I have reviewed the above documentation for accuracy and completeness, and I agree with the above. -  Georgianne Fick, FNP

## 2020-11-29 LAB — COMPREHENSIVE METABOLIC PANEL
AG Ratio: 1.4 (calc) (ref 1.0–2.5)
ALT: 12 U/L (ref 6–29)
AST: 11 U/L (ref 10–35)
Albumin: 4.2 g/dL (ref 3.6–5.1)
Alkaline phosphatase (APISO): 85 U/L (ref 37–153)
BUN: 9 mg/dL (ref 7–25)
CO2: 26 mmol/L (ref 20–32)
Calcium: 9.2 mg/dL (ref 8.6–10.4)
Chloride: 100 mmol/L (ref 98–110)
Creat: 0.73 mg/dL (ref 0.50–1.05)
Globulin: 3 g/dL (calc) (ref 1.9–3.7)
Glucose, Bld: 84 mg/dL (ref 65–99)
Potassium: 3.6 mmol/L (ref 3.5–5.3)
Sodium: 139 mmol/L (ref 135–146)
Total Bilirubin: 0.3 mg/dL (ref 0.2–1.2)
Total Protein: 7.2 g/dL (ref 6.1–8.1)

## 2020-11-29 LAB — HEMOGLOBIN A1C
Hgb A1c MFr Bld: 5.6 % of total Hgb (ref <5.7)
Mean Plasma Glucose: 114 mg/dL
eAG (mmol/L): 6.3 mmol/L

## 2020-11-29 LAB — LIPID PANEL
Cholesterol: 119 mg/dL (ref <200)
HDL: 36 mg/dL — ABNORMAL LOW (ref >=50)
LDL Cholesterol (Calc): 64 mg/dL (calc)
Non-HDL Cholesterol (Calc): 83 mg/dL (calc) (ref <130)
Total CHOL/HDL Ratio: 3.3 (calc) (ref <5.0)
Triglycerides: 108 mg/dL (ref <150)

## 2020-11-29 LAB — CBC
HCT: 40.9 % (ref 35.0–45.0)
Hemoglobin: 13.5 g/dL (ref 11.7–15.5)
MCH: 28.3 pg (ref 27.0–33.0)
MCHC: 33 g/dL (ref 32.0–36.0)
MCV: 85.7 fL (ref 80.0–100.0)
MPV: 9.9 fL (ref 7.5–12.5)
Platelets: 326 10*3/uL (ref 140–400)
RBC: 4.77 10*6/uL (ref 3.80–5.10)
RDW: 13.6 % (ref 11.0–15.0)
WBC: 7 10*3/uL (ref 3.8–10.8)

## 2020-11-29 LAB — TSH: TSH: 2.19 mIU/L

## 2020-11-29 LAB — VITAMIN B12: Vitamin B-12: 460 pg/mL (ref 200–1100)

## 2020-11-29 LAB — VITAMIN D 25 HYDROXY (VIT D DEFICIENCY, FRACTURES): Vit D, 25-Hydroxy: 78 ng/mL (ref 30–100)

## 2020-11-29 LAB — INSULIN, RANDOM: Insulin: 13.5 u[IU]/mL

## 2020-12-05 ENCOUNTER — Other Ambulatory Visit (HOSPITAL_BASED_OUTPATIENT_CLINIC_OR_DEPARTMENT_OTHER): Payer: Self-pay

## 2020-12-05 ENCOUNTER — Encounter: Payer: Self-pay | Admitting: Family Medicine

## 2020-12-05 ENCOUNTER — Encounter: Payer: Self-pay | Admitting: *Deleted

## 2020-12-05 ENCOUNTER — Other Ambulatory Visit: Payer: Self-pay

## 2020-12-05 ENCOUNTER — Ambulatory Visit (INDEPENDENT_AMBULATORY_CARE_PROVIDER_SITE_OTHER): Payer: BC Managed Care – PPO | Admitting: Family Medicine

## 2020-12-05 VITALS — BP 128/76 | HR 84 | Temp 99.4°F | Resp 16 | Ht 70.0 in | Wt 312.8 lb

## 2020-12-05 DIAGNOSIS — M25521 Pain in right elbow: Secondary | ICD-10-CM | POA: Diagnosis not present

## 2020-12-05 DIAGNOSIS — E1159 Type 2 diabetes mellitus with other circulatory complications: Secondary | ICD-10-CM

## 2020-12-05 DIAGNOSIS — I10 Essential (primary) hypertension: Secondary | ICD-10-CM | POA: Diagnosis not present

## 2020-12-05 DIAGNOSIS — I152 Hypertension secondary to endocrine disorders: Secondary | ICD-10-CM

## 2020-12-05 DIAGNOSIS — E876 Hypokalemia: Secondary | ICD-10-CM

## 2020-12-05 DIAGNOSIS — E1169 Type 2 diabetes mellitus with other specified complication: Secondary | ICD-10-CM

## 2020-12-05 DIAGNOSIS — E785 Hyperlipidemia, unspecified: Secondary | ICD-10-CM

## 2020-12-05 DIAGNOSIS — E669 Obesity, unspecified: Secondary | ICD-10-CM

## 2020-12-05 DIAGNOSIS — Z0000 Encounter for general adult medical examination without abnormal findings: Secondary | ICD-10-CM

## 2020-12-05 DIAGNOSIS — E559 Vitamin D deficiency, unspecified: Secondary | ICD-10-CM

## 2020-12-05 DIAGNOSIS — T7840XA Allergy, unspecified, initial encounter: Secondary | ICD-10-CM

## 2020-12-05 DIAGNOSIS — R7989 Other specified abnormal findings of blood chemistry: Secondary | ICD-10-CM

## 2020-12-05 MED ORDER — HYDROCHLOROTHIAZIDE 25 MG PO TABS
25.0000 mg | ORAL_TABLET | Freq: Every day | ORAL | 1 refills | Status: DC
Start: 2020-12-05 — End: 2021-06-01
  Filled 2020-12-05: qty 90, 90d supply, fill #0
  Filled 2021-03-02: qty 90, 90d supply, fill #1

## 2020-12-05 MED ORDER — LOSARTAN POTASSIUM 100 MG PO TABS
100.0000 mg | ORAL_TABLET | Freq: Every day | ORAL | 1 refills | Status: DC
  Filled 2020-12-05: qty 90, 90d supply, fill #0
  Filled 2021-03-02: qty 90, 90d supply, fill #1

## 2020-12-05 MED ORDER — AMLODIPINE BESYLATE 10 MG PO TABS
10.0000 mg | ORAL_TABLET | Freq: Every day | ORAL | 1 refills | Status: DC
  Filled 2020-12-05: qty 90, 90d supply, fill #0
  Filled 2021-03-02: qty 90, 90d supply, fill #1

## 2020-12-05 MED ORDER — FLUTICASONE PROPIONATE 50 MCG/ACT NA SUSP
2.0000 | Freq: Every day | NASAL | 2 refills | Status: DC | PRN
  Filled 2020-12-05: qty 16, 30d supply, fill #0

## 2020-12-05 MED ORDER — FUROSEMIDE 20 MG PO TABS
20.0000 mg | ORAL_TABLET | Freq: Every day | ORAL | 1 refills | Status: DC | PRN
  Filled 2020-12-05: qty 30, 30d supply, fill #0

## 2020-12-05 MED ORDER — POTASSIUM CHLORIDE CRYS ER 20 MEQ PO TBCR
EXTENDED_RELEASE_TABLET | ORAL | 1 refills | Status: DC
  Filled 2020-12-05: qty 270, 90d supply, fill #0
  Filled 2021-03-23: qty 270, 90d supply, fill #1

## 2020-12-05 NOTE — Assessment & Plan Note (Signed)
Well controlled, no changes to meds. Encouraged heart healthy diet such as the DASH diet and exercise as tolerated.  °

## 2020-12-05 NOTE — Patient Instructions (Addendum)
Another beta blocker to consider is Carvedilol could replace Metoprolol if appropriate  Shingrix is the new shingles shot, 2 shots over 2-6 months at the pharmacy or check with insurance regarding coverage and make a nurse appt to get it here. Preventive Care 64-53 Years Old, Female Preventive care refers to lifestyle choices and visits with your health care provider that can promote health and wellness. This includes:  A yearly physical exam. This is also called an annual wellness visit.  Regular dental and eye exams.  Immunizations.  Screening for certain conditions.  Healthy lifestyle choices, such as: ? Eating a healthy diet. ? Getting regular exercise. ? Not using drugs or products that contain nicotine and tobacco. ? Limiting alcohol use. What can I expect for my preventive care visit? Physical exam Your health care provider will check your:  Height and weight. These may be used to calculate your BMI (body mass index). BMI is a measurement that tells if you are at a healthy weight.  Heart rate and blood pressure.  Body temperature.  Skin for abnormal spots. Counseling Your health care provider may ask you questions about your:  Past medical problems.  Family's medical history.  Alcohol, tobacco, and drug use.  Emotional well-being.  Home life and relationship well-being.  Sexual activity.  Diet, exercise, and sleep habits.  Work and work Statistician.  Access to firearms.  Method of birth control.  Menstrual cycle.  Pregnancy history. What immunizations do I need? Vaccines are usually given at various ages, according to a schedule. Your health care provider will recommend vaccines for you based on your age, medical history, and lifestyle or other factors, such as travel or where you work.   What tests do I need? Blood tests  Lipid and cholesterol levels. These may be checked every 5 years, or more often if you are over 93 years old.  Hepatitis C  test.  Hepatitis B test. Screening  Lung cancer screening. You may have this screening every year starting at age 21 if you have a 30-pack-year history of smoking and currently smoke or have quit within the past 15 years.  Colorectal cancer screening. ? All adults should have this screening starting at age 56 and continuing until age 60. ? Your health care provider may recommend screening at age 57 if you are at increased risk. ? You will have tests every 1-10 years, depending on your results and the type of screening test.  Diabetes screening. ? This is done by checking your blood sugar (glucose) after you have not eaten for a while (fasting). ? You may have this done every 1-3 years.  Mammogram. ? This may be done every 1-2 years. ? Talk with your health care provider about when you should start having regular mammograms. This may depend on whether you have a family history of breast cancer.  BRCA-related cancer screening. This may be done if you have a family history of breast, ovarian, tubal, or peritoneal cancers.  Pelvic exam and Pap test. ? This may be done every 3 years starting at age 43. ? Starting at age 66, this may be done every 5 years if you have a Pap test in combination with an HPV test. Other tests  STD (sexually transmitted disease) testing, if you are at risk.  Bone density scan. This is done to screen for osteoporosis. You may have this scan if you are at high risk for osteoporosis. Talk with your health care provider about your test results,  treatment options, and if necessary, the need for more tests. Follow these instructions at home: Eating and drinking  Eat a diet that includes fresh fruits and vegetables, whole grains, lean protein, and low-fat dairy products.  Take vitamin and mineral supplements as recommended by your health care provider.  Do not drink alcohol if: ? Your health care provider tells you not to drink. ? You are pregnant, may be  pregnant, or are planning to become pregnant.  If you drink alcohol: ? Limit how much you have to 0-1 drink a day. ? Be aware of how much alcohol is in your drink. In the [place] one drink equals one 12 oz bottle of beer (355 mL), one 5 oz glass of wine (148 mL), or one 1 oz glass of hard liquor (44 mL).   Lifestyle  Take daily care of your teeth and gums. Brush your teeth every morning and night with fluoride toothpaste. Floss one time each day.  Stay active. Exercise for at least 30 minutes 5 or more days each week.  Do not use any products that contain nicotine or tobacco, such as cigarettes, e-cigarettes, and chewing tobacco. If you need help quitting, ask your health care provider.  Do not use drugs.  If you are sexually active, practice safe sex. Use a condom or other form of protection to prevent STIs (sexually transmitted infections).  If you do not wish to become pregnant, use a form of birth control. If you plan to become pregnant, see your health care provider for a prepregnancy visit.  If told by your health care provider, take low-dose aspirin daily starting at age 27.  Find healthy ways to cope with stress, such as: ? Meditation, yoga, or listening to music. ? Journaling. ? Talking to a trusted person. ? Spending time with friends and family. Safety  Always wear your seat belt while driving or riding in a vehicle.  Do not drive: ? If you have been drinking alcohol. Do not ride with someone who has been drinking. ? When you are tired or distracted. ? While texting.  Wear a helmet and other protective equipment during sports activities.  If you have firearms in your house, make sure you follow all gun safety procedures. What's next?  Visit your health care provider once a year for an annual wellness visit.  Ask your health care provider how often you should have your eyes and teeth checked.  Stay up to date on all vaccines. This information is not intended to  replace advice given to you by your health care provider. Make sure you discuss any questions you have with your health care provider. Document Revised: 05/24/2020 Document Reviewed: 05/01/2018 Elsevier Patient Education  2021 [employer].

## 2020-12-05 NOTE — Assessment & Plan Note (Signed)
hgba1c acceptable, minimize simple carbs. Increase exercise as tolerated. Continue current meds 

## 2020-12-05 NOTE — Assessment & Plan Note (Signed)
monitor

## 2020-12-05 NOTE — Progress Notes (Signed)
Subjective:    Patient ID: Brandi Lester, female    DOB: 08/30/1966, 55 y.o.   MRN: [id number]  No chief complaint on file.   HPI Patient is in today for annual preventative exam and follow up on chronic medical concerns. No recent febrile illness or hospitalizations. No polyuria or polydipsia. 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 She just underwent a cardiac work up which was reassuring and has not had any recent palpitations or chest pains. She is noting some trouble with right elbow pain with certain movements. She denies any trauma or fall. No warmth or redness or swelling at elbow.  Denies CP/palp/SOB/HA/congestion/fevers/GI or GU c/o. Taking meds as prescribed  Past Medical History:  Diagnosis Date  . ADD (attention deficit disorder)   . Allergy    allergic rhinitis  . Anemia 07/25/2013  . Angio-edema   . Anxiety   . Arthritis    not dx'd  . Back pain   . Costochondritis 02/20/2015  . Diabetes mellitus    Type II   previously 3 years ago - no longer on meds   . Dyslipidemia 07/25/2013  . Fungus infection 10/13   Left great toe  . GERD (gastroesophageal reflux disease)   . Hypertension   . IBS (irritable bowel syndrome)   . Ingrown nail 10/13   right foot next to the last toe  . Insomnia 04/20/2013  . Joint pain   . Lactose intolerance   . Leg edema   . Obesity   . Pedal edema 02/20/2015  . Prediabetes   . Preventative health care 09/25/2015  . PVC (premature ventricular contraction)     Past Surgical History:  Procedure Laterality Date  . HYSTEROSCOPY N/A 10/04/2015   Procedure: HYSTEROSCOPY with Removal IUD;  Surgeon: Vanessa Kick, MD;  Location: [facility];  Service: Gynecology;  Laterality: N/A;  . lasik     eye surgery  . WISDOM TOOTH EXTRACTION      Family History  Problem Relation Age of Onset  . Heart attack Mother 70  . Hypertension Mother   . Heart disease Mother   . Obesity Mother   . Cancer  Father        prostate  . Prostate cancer Father   . Heart attack Sister 29  . Hypertension Other   . Colon cancer Neg Hx   . Colon polyps Neg Hx   . Esophageal cancer Neg Hx   . Stomach cancer Neg Hx   . Rectal cancer Neg Hx     Social History   Socioeconomic History  . Marital status: Single    Spouse name: Not on file  . Number of children: Not on file  . Years of education: Not on file  . Highest education level: Not on file  Occupational History  . Occupation: Pharmacist, hospital, [employer]  Tobacco Use  . Smoking status: Former Smoker    Packs/day: 1.00    Years: 15.00    Pack years: 15.00    Quit date: 09/03/2001    Years since quitting: 19.2  . Smokeless tobacco: Never Used  Vaping Use  . Vaping Use: Never used  Substance and Sexual Activity  . Alcohol use: Yes    Comment: rare  . Drug use: No  . Sexual activity: Yes    Birth control/protection: None  Other Topics Concern  . Not on file  Social History Narrative  . Not on file  Social Determinants of Health   Financial Resource Strain: Not on file  Food Insecurity: Not on file  Transportation Needs: Not on file  Physical Activity: Not on file  Stress: Not on file  Social Connections: Not on file  Intimate Partner Violence: Not on file    Outpatient Medications Prior to Visit  Medication Sig Dispense Refill  . acetaminophen (TYLENOL 8 HOUR) 650 MG CR tablet Take 1 tablet (650 mg total) every 8 (eight) hours as needed by mouth for pain.    [name] atorvastatin (LIPITOR) 10 MG tablet TAKE 1 TABLET BY MOUTH ONCE DAILY 90 tablet 0  . azelastine (ASTELIN) 0.1 % nasal spray Place 2 sprays into both nostrils 2 (two) times daily. Use in each nostril as directed (Patient taking differently: Place 2 sprays into both nostrils as needed. Use in each nostril as directed) 30 mL 2  . buPROPion (WELLBUTRIN SR) 150 MG 12 hr tablet TAKE 1 TABLET BY MOUTH EVERY MORNING 90 tablet 0  . cholecalciferol (VITAMIN D3) 25 MCG (1000 UT)  tablet Take 4,000 Units by mouth daily.    [name] EPINEPHrine (EPIPEN 2-PAK) 0.3 mg/0.3 mL IJ SOAJ injection Use as directed for severe allergic reaction 2 each 1  . ibuprofen (ADVIL,MOTRIN) 600 MG tablet Take 1 tablet (600 mg total) by mouth every 6 (six) hours as needed. 90 tablet 0  . ivabradine (CORLANOR) 5 MG TABS tablet TAKE 1 TABLET (5 MG TOTAL) BY MOUTH ONCE FOR 1 DOSE 2 HRS PRIOR TO CT APPT 1 tablet 0  . medroxyPROGESTERone (PROVERA) 10 MG tablet TAKE 1 TABLET (10 MG TOTAL) BY MOUTH DAILY. 90 tablet 3  . metFORMIN (GLUCOPHAGE) 500 MG tablet TAKE 1 TABLET BY MOUTH TWICE DAILY WITH A MEAL 180 tablet 0  . metoprolol succinate (TOPROL-XL) 50 MG 24 hr tablet TAKE 1 TABLET BY MOUTH DAILY WITH OR IMMEDIATELY FOLLOWING A MEAL 90 tablet 0  . montelukast (SINGULAIR) 10 MG tablet Take 1 tablet (10 mg total) by mouth at bedtime as needed. 90 tablet 1  . Omega-3 Fatty Acids (FISH OIL PO) Take 1 capsule by mouth daily.    . ondansetron (ZOFRAN-ODT) 8 MG disintegrating tablet DISSOLVE 1 TABLET (8 MG TOTAL) BY MOUTH EVERY 8 (EIGHT) HOURS AS NEEDED FOR NAUSEA OR VOMITING. 20 tablet 0  . pantoprazole (PROTONIX) 40 MG tablet TAKE 1 TABLET BY MOUTH ONCE DAILY 90 tablet 1  . Probiotic Product (PROBIOTIC-10 PO) Take by mouth as needed.     . Semaglutide,0.25 or 0.5MG /DOS, 2 MG/1.5ML SOPN INJECT 0.5MG  INTO THE SKIN ONCE A WEEK 1.5 mL 0  . TURMERIC PO Take 1 capsule by mouth daily.    [name] amLODipine (NORVASC) 10 MG tablet TAKE 1 TABLET BY MOUTH ONCE DAILY **NEEDS APPOINTMENT FOR FURTHER REFILLS** 90 tablet 1  . fluticasone (FLONASE) 50 MCG/ACT nasal spray Place 2 sprays into both nostrils daily as needed for allergies. 16 g 2  . hydrochlorothiazide (HYDRODIURIL) 25 MG tablet TAKE 1 TABLET BY MOUTH ONCE DAILY **NEEDS APPOINTMENT FOR FURTHER REFILLS** 90 tablet 1  . losartan (COZAAR) 100 MG tablet TAKE 1 TABLET BY MOUTH ONCE DAILY EVERY NIGHT AT BEDTIME **NEEDS APPOINTMENT FOR FURTHER REFILLS** 90 tablet 1  . potassium  chloride SA (KLOR-CON) 20 MEQ tablet TAKE 1 TABLET (20 MEQ TOTAL) BY MOUTH 3 (THREE) TIMES DAILY. 270 tablet 1   No facility-administered medications prior to visit.    Allergies  Allergen Reactions  . Penicillins Itching and Swelling    Has patient had  a PCN reaction causing immediate rash, facial/tongue/throat swelling, SOB or lightheadedness with hypotension: yes Has patient had a PCN reaction causing severe rash involving mucus membranes or skin necrosis: no Has patient had a PCN reaction that required hospitalization no Has patient had a PCN reaction occurring within the last 10 years: yes If all of the above answers are "NO", then may proceed with Cephalosporin use.   [name] [Liraglutide]     Vomiting    Review of Systems  Constitutional: Negative for chills, fever and malaise/fatigue.  HENT: Negative for congestion and hearing loss.   Eyes: Negative for discharge.  Respiratory: Negative for cough, sputum production and shortness of breath.   Cardiovascular: Negative for chest pain, palpitations and leg swelling.  Gastrointestinal: Negative for abdominal pain, blood in stool, constipation, diarrhea, heartburn, nausea and vomiting.  Genitourinary: Negative for dysuria, frequency, hematuria and urgency.  Musculoskeletal: Positive for joint pain. Negative for back pain, falls and myalgias.  Skin: Negative for rash.  Neurological: Negative for dizziness, sensory change, loss of consciousness, weakness and headaches.  Endo/Heme/Allergies: Negative for environmental allergies. Does not bruise/bleed easily.  Psychiatric/Behavioral: Negative for depression and suicidal ideas. The patient is not nervous/anxious and does not have insomnia.        Objective:    Physical Exam Constitutional:      General: She is not in acute distress.    Appearance: She is well-developed.  HENT:     Head: Normocephalic and atraumatic.  Eyes:     Conjunctiva/sclera: Conjunctivae normal.  Neck:      Thyroid: No thyromegaly.  Cardiovascular:     Rate and Rhythm: Normal rate and regular rhythm.     Heart sounds: Normal heart sounds. No murmur heard.   Pulmonary:     Effort: Pulmonary effort is normal. No respiratory distress.     Breath sounds: Normal breath sounds.  Abdominal:     General: Bowel sounds are normal. There is no distension.     Palpations: Abdomen is soft. There is no mass.     Tenderness: There is no abdominal tenderness.  Musculoskeletal:     Cervical back: Neck supple.  Lymphadenopathy:     Cervical: No cervical adenopathy.  Skin:    General: Skin is warm and dry.  Neurological:     Mental Status: She is alert and oriented to person, place, and time.  Psychiatric:        Behavior: Behavior normal.     BP 128/76 (BP Location: Left Arm, Cuff Size: Large)   Pulse 84   Temp 99.4 F (37.4 C) (Oral)   Resp 16   Ht 5\' 10"  (1.778 m)   Wt (!) 312 lb 12.8 oz (141.9 kg)   SpO2 97%   BMI 44.88 kg/m  Wt Readings from Last 3 Encounters:  12/05/20 (!) 312 lb 12.8 oz (141.9 kg)  11/24/20 (!) 309 lb (140.2 kg)  11/03/20 (!) 308 lb (139.7 kg)    Diabetic Foot Exam - Simple   Simple Foot Form Visual Inspection No deformities, no ulcerations, no other skin breakdown bilaterally: Yes Sensation Testing Intact to touch and monofilament testing bilaterally: Yes Pulse Check Posterior Tibialis and Dorsalis pulse intact bilaterally: Yes Comments    Lab Results  Component Value Date   WBC 7.0 11/28/2020   HGB 13.5 11/28/2020   HCT 40.9 11/28/2020   PLT 326 11/28/2020   GLUCOSE 84 11/28/2020   CHOL 119 11/28/2020   TRIG 108 11/28/2020   HDL 36 (L)  11/28/2020   LDLCALC 64 11/28/2020   ALT 12 11/28/2020   AST 11 11/28/2020   NA 139 11/28/2020   K 3.6 11/28/2020   CL 100 11/28/2020   CREATININE 0.73 11/28/2020   BUN 9 11/28/2020   CO2 26 11/28/2020   TSH 2.19 11/28/2020   HGBA1C 5.6 11/28/2020   MICROALBUR 0.50 12/15/2012    Lab Results   Component Value Date   TSH 2.19 11/28/2020   Lab Results  Component Value Date   WBC 7.0 11/28/2020   HGB 13.5 11/28/2020   HCT 40.9 11/28/2020   MCV 85.7 11/28/2020   PLT 326 11/28/2020   Lab Results  Component Value Date   NA 139 11/28/2020   K 3.6 11/28/2020   CO2 26 11/28/2020   GLUCOSE 84 11/28/2020   BUN 9 11/28/2020   CREATININE 0.73 11/28/2020   BILITOT 0.3 11/28/2020   ALKPHOS 68 03/21/2020   AST 11 11/28/2020   ALT 12 11/28/2020   PROT 7.2 11/28/2020   ALBUMIN 4.0 03/21/2020   CALCIUM 9.2 11/28/2020   GFR 95.31 03/21/2020   Lab Results  Component Value Date   CHOL 119 11/28/2020   Lab Results  Component Value Date   HDL 36 (L) 11/28/2020   Lab Results  Component Value Date   LDLCALC 64 11/28/2020   Lab Results  Component Value Date   TRIG 108 11/28/2020   Lab Results  Component Value Date   CHOLHDL 3.3 11/28/2020   Lab Results  Component Value Date   HGBA1C 5.6 11/28/2020       Assessment & Plan:   Problem List Items Addressed This Visit    Essential hypertension   Relevant Medications   losartan (COZAAR) 100 MG tablet   amLODipine (NORVASC) 10 MG tablet   hydrochlorothiazide (HYDRODIURIL) 25 MG tablet   furosemide (LASIX) 20 MG tablet   Other Relevant Orders   CBC   Comprehensive metabolic panel   TSH   Diabetes mellitus type 2 in obese (HCC)    hgba1c acceptable, minimize simple carbs. Increase exercise as tolerated. Continue current meds      Relevant Medications   losartan (COZAAR) 100 MG tablet   Other Relevant Orders   Hemoglobin A1c   Hyperlipidemia associated with type 2 diabetes mellitus (HCC)    Encouraged heart healthy diet, increase exercise, avoid trans fats, consider a krill oil cap daily      Relevant Medications   losartan (COZAAR) 100 MG tablet   Other Relevant Orders   Lipid panel   Hypokalemia   Relevant Medications   potassium chloride SA (KLOR-CON) 20 MEQ tablet   Preventative health care     Patient encouraged to maintain heart healthy diet, regular exercise, adequate sleep. Consider daily probiotics. Take medications as prescribed. Labs reviewed. Colonoscopy 05/2020 repeat in 10 years. Pap January 2021 repeat in 3 years. MGM 09/2020 repeat next year      Vitamin D deficiency    Supplement and monitor      Relevant Orders   VITAMIN D 25 Hydroxy (Vit-D Deficiency, Fractures)   High serum vitamin B12    monitor      Relevant Orders   Vitamin B12   Hypertension associated with type 2 diabetes mellitus ([place])    Well controlled, no changes to meds. Encouraged heart healthy diet such as the DASH diet and exercise as tolerated.       Relevant Medications   losartan (COZAAR) 100 MG tablet   amLODipine (NORVASC) 10  MG tablet   hydrochlorothiazide (HYDRODIURIL) 25 MG tablet   furosemide (LASIX) 20 MG tablet   Allergy   Relevant Medications   potassium chloride SA (KLOR-CON) 20 MEQ tablet   fluticasone (FLONASE) 50 MCG/ACT nasal spray   Right elbow pain    Possible tendonitis encouraged ice ad lidocaine topically if no improvement will refer to sports med       Other Visit Diagnoses    Benign essential HTN       Relevant Medications   losartan (COZAAR) 100 MG tablet   amLODipine (NORVASC) 10 MG tablet   hydrochlorothiazide (HYDRODIURIL) 25 MG tablet   potassium chloride SA (KLOR-CON) 20 MEQ tablet   furosemide (LASIX) 20 MG tablet      I have changed Blia T. Galyean's losartan, amLODipine, and hydrochlorothiazide. I am also having her start on furosemide. Additionally, I am having her maintain her TURMERIC PO, Omega-3 Fatty Acids (FISH OIL PO), ibuprofen, Probiotic Product (PROBIOTIC-10 PO), acetaminophen, EPINEPHrine, cholecalciferol, azelastine, montelukast, metFORMIN, Semaglutide(0.25 or 0.5MG /DOS), buPROPion, atorvastatin, ivabradine, ondansetron, pantoprazole, medroxyPROGESTERone, metoprolol succinate, potassium chloride SA, and fluticasone.  Meds ordered this  encounter  Medications  . losartan (COZAAR) 100 MG tablet    Sig: Take 1 tablet (100 mg total) by mouth daily.    Dispense:  90 tablet    Refill:  1  . amLODipine (NORVASC) 10 MG tablet    Sig: Take 1 tablet (10 mg total) by mouth daily.    Dispense:  90 tablet    Refill:  1  . hydrochlorothiazide (HYDRODIURIL) 25 MG tablet    Sig: Take 1 tablet (25 mg total) by mouth daily.    Dispense:  90 tablet    Refill:  1  . potassium chloride SA (KLOR-CON) 20 MEQ tablet    Sig: TAKE 1 TABLET (20 MEQ TOTAL) BY MOUTH 3 (THREE) TIMES DAILY.    Dispense:  270 tablet    Refill:  1  . fluticasone (FLONASE) 50 MCG/ACT nasal spray    Sig: Place 2 sprays into both nostrils daily as needed for allergies.    Dispense:  16 g    Refill:  2  . furosemide (LASIX) 20 MG tablet    Sig: Take 1 tablet (20 mg total) by mouth daily as needed for edema.    Dispense:  30 tablet    Refill:  1     Penni Homans, MD

## 2020-12-05 NOTE — Assessment & Plan Note (Signed)
Supplement and monitor 

## 2020-12-05 NOTE — Assessment & Plan Note (Signed)
Patient encouraged to maintain heart healthy diet, regular exercise, adequate sleep. Consider daily probiotics. Take medications as prescribed. Labs reviewed. Colonoscopy 05/2020 repeat in 10 years. Pap January 2021 repeat in 3 years. MGM 09/2020 repeat next year

## 2020-12-05 NOTE — Assessment & Plan Note (Signed)
Encouraged heart healthy diet, increase exercise, avoid trans fats, consider a krill oil cap daily 

## 2020-12-05 NOTE — Assessment & Plan Note (Signed)
Possible tendonitis encouraged ice ad lidocaine topically if no improvement will refer to sports med

## 2020-12-05 NOTE — Assessment & Plan Note (Deleted)
Well controlled, no changes to meds. Encouraged heart healthy diet such as the DASH diet and exercise as tolerated.  °

## 2020-12-15 ENCOUNTER — Other Ambulatory Visit (HOSPITAL_BASED_OUTPATIENT_CLINIC_OR_DEPARTMENT_OTHER): Payer: Self-pay

## 2020-12-15 MED ORDER — ZOSTER VAC RECOMB ADJUVANTED 50 MCG/0.5ML IM SUSR
INTRAMUSCULAR | 0 refills | Status: DC
  Filled 2020-12-15: qty 1, 1d supply, fill #0

## 2020-12-15 MED FILL — Bupropion HCl Tab ER 12HR 150 MG: ORAL | 90 days supply | Qty: 90 | Fill #0 | Status: AC

## 2020-12-20 ENCOUNTER — Encounter (INDEPENDENT_AMBULATORY_CARE_PROVIDER_SITE_OTHER): Payer: Self-pay | Admitting: Family Medicine

## 2020-12-20 ENCOUNTER — Other Ambulatory Visit (HOSPITAL_BASED_OUTPATIENT_CLINIC_OR_DEPARTMENT_OTHER): Payer: Self-pay

## 2020-12-20 ENCOUNTER — Other Ambulatory Visit: Payer: Self-pay

## 2020-12-20 ENCOUNTER — Ambulatory Visit (INDEPENDENT_AMBULATORY_CARE_PROVIDER_SITE_OTHER): Payer: BC Managed Care – PPO | Admitting: Family Medicine

## 2020-12-20 VITALS — BP 150/83 | HR 90 | Temp 98.1°F | Ht 70.0 in | Wt 306.0 lb

## 2020-12-20 DIAGNOSIS — K5903 Drug induced constipation: Secondary | ICD-10-CM | POA: Diagnosis not present

## 2020-12-20 DIAGNOSIS — E1169 Type 2 diabetes mellitus with other specified complication: Secondary | ICD-10-CM

## 2020-12-20 DIAGNOSIS — Z6841 Body Mass Index (BMI) 40.0 and over, adult: Secondary | ICD-10-CM | POA: Diagnosis not present

## 2020-12-20 MED ORDER — SEMAGLUTIDE(0.25 OR 0.5MG/DOS) 2 MG/1.5ML ~~LOC~~ SOPN
PEN_INJECTOR | SUBCUTANEOUS | 0 refills | Status: DC
  Filled 2020-12-20: qty 1.5, 30d supply, fill #0

## 2020-12-21 ENCOUNTER — Encounter (INDEPENDENT_AMBULATORY_CARE_PROVIDER_SITE_OTHER): Payer: Self-pay | Admitting: Family Medicine

## 2020-12-21 NOTE — Progress Notes (Addendum)
Chief Complaint:   OBESITY Brandi Lester is here to discuss her progress with her obesity treatment plan along with follow-up of her obesity related diagnoses. Brandi Lester is on the Category 4 Plan and states she is following her eating plan approximately 50% of the time. Brandi Lester states she is biking for 45 minutes 4 times per week.  Today's visit was #: 31 Starting weight: 325 lbs Starting date: 05/23/2017 Today's weight: 306 lbs Today's date: 12/20/2020 Total lbs lost to date: 19 lbs Total lbs lost since last in-office visit: 3 lbs  Interim History: Brandi Lester admits to some sweets recently and is having some constipation.  She is skipping quite a few meals.  She is extra hungry after skipping meals and then tends to snack or overeat.  Subjective:   1. Type 2 diabetes mellitus with other specified complication, without long-term current use of insulin (HCC) Very well controlled.  Last A1c was 5.6.  On metformin 500 mg twice daily and Ozempic 0.5 mg.  Notes good appetite suppression with 0.5 mg.  Lab Results  Component Value Date   HGBA1C 5.6 11/28/2020   HGBA1C 6.1 03/21/2020   HGBA1C 5.9 (H) 12/01/2019   Lab Results  Component Value Date   MICROALBUR 0.50 12/15/2012   LDLCALC 64 11/28/2020   CREATININE 0.73 11/28/2020   Lab Results  Component Value Date   INSULIN 20.7 12/01/2019   INSULIN 12.9 07/07/2019   INSULIN 21.2 11/20/2018   INSULIN 15.4 07/14/2018   INSULIN 12.9 09/12/2017   2. Drug-induced constipation Notes increased constipation with increased dose of Ozempic.  On daily stool softener.  Assessment/Plan:   1. Type 2 diabetes mellitus with other specified complication, without long-term current use of insulin (HCC) Refill Ozempic 0.5 mg weekly, as per below.  - Refill Semaglutide,0.25 or 0.5MG /DOS, 2 MG/1.5ML SOPN; INJECT 0.5MG  INTO THE SKIN ONCE A WEEK  Dispense: 1.5 mL; Refill: 0  2. Drug-induced constipation Continue stool softener.  Start MiraLAX every  other day up to daily.  3. Obesity: BMI 36  Brandi Lester is currently in the action stage of change. As such, her goal is to continue with weight loss efforts. She has agreed to the Category 4 Plan.   Exercise goals: As is.  Behavioral modification strategies: increasing lean protein intake.  Brandi Lester has agreed to follow-up with our clinic in 3 weeks.   Objective:   Blood pressure (!) 150/83, pulse 90, temperature 98.1 F (36.7 C), height 5\' 10"  (1.778 m), weight (!) 306 lb (138.8 kg), SpO2 99 %. Body mass index is 43.91 kg/m.  General: Cooperative, alert, well developed, in no acute distress. HEENT: Conjunctivae and lids unremarkable. Cardiovascular: Regular rhythm.  Lungs: Normal work of breathing. Neurologic: No focal deficits.   Lab Results  Component Value Date   CREATININE 0.73 11/28/2020   BUN 9 11/28/2020   NA 139 11/28/2020   K 3.6 11/28/2020   CL 100 11/28/2020   CO2 26 11/28/2020   Lab Results  Component Value Date   ALT 12 11/28/2020   AST 11 11/28/2020   ALKPHOS 68 03/21/2020   BILITOT 0.3 11/28/2020   Lab Results  Component Value Date   HGBA1C 5.6 11/28/2020   HGBA1C 6.1 03/21/2020   HGBA1C 5.9 (H) 12/01/2019   HGBA1C 5.9 (H) 07/07/2019   HGBA1C 6.1 (H) 11/20/2018   Lab Results  Component Value Date   INSULIN 20.7 12/01/2019   INSULIN 12.9 07/07/2019   INSULIN 21.2 11/20/2018   INSULIN 15.4 07/14/2018  INSULIN 12.9 09/12/2017   Lab Results  Component Value Date   TSH 2.19 11/28/2020   Lab Results  Component Value Date   CHOL 119 11/28/2020   HDL 36 (L) 11/28/2020   LDLCALC 64 11/28/2020   TRIG 108 11/28/2020   CHOLHDL 3.3 11/28/2020   Lab Results  Component Value Date   WBC 7.0 11/28/2020   HGB 13.5 11/28/2020   HCT 40.9 11/28/2020   MCV 85.7 11/28/2020   PLT 326 11/28/2020   Attestation Statements:   Reviewed by clinician on day of visit: allergies, medications, problem list, medical history, surgical history, family history,  social history, and previous encounter notes.  I, Water quality scientist, CMA, am acting as Location manager for [employer], [place].  I have reviewed the above documentation for accuracy and completeness, and I agree with the above. -  Georgianne Fick, FNP

## 2020-12-27 ENCOUNTER — Other Ambulatory Visit (HOSPITAL_BASED_OUTPATIENT_CLINIC_OR_DEPARTMENT_OTHER): Payer: Self-pay

## 2021-01-11 ENCOUNTER — Encounter (INDEPENDENT_AMBULATORY_CARE_PROVIDER_SITE_OTHER): Payer: Self-pay

## 2021-01-11 ENCOUNTER — Ambulatory Visit (INDEPENDENT_AMBULATORY_CARE_PROVIDER_SITE_OTHER): Payer: BC Managed Care – PPO | Admitting: Family Medicine

## 2021-01-16 ENCOUNTER — Ambulatory Visit: Payer: BC Managed Care – PPO | Admitting: Cardiology

## 2021-01-16 ENCOUNTER — Other Ambulatory Visit: Payer: Self-pay

## 2021-01-16 ENCOUNTER — Encounter: Payer: Self-pay | Admitting: Cardiology

## 2021-01-16 ENCOUNTER — Other Ambulatory Visit (HOSPITAL_BASED_OUTPATIENT_CLINIC_OR_DEPARTMENT_OTHER): Payer: Self-pay

## 2021-01-16 VITALS — BP 152/84 | HR 89 | Ht 70.0 in | Wt 313.1 lb

## 2021-01-16 DIAGNOSIS — I491 Atrial premature depolarization: Secondary | ICD-10-CM | POA: Diagnosis not present

## 2021-01-16 DIAGNOSIS — I10 Essential (primary) hypertension: Secondary | ICD-10-CM | POA: Insufficient documentation

## 2021-01-16 DIAGNOSIS — E785 Hyperlipidemia, unspecified: Secondary | ICD-10-CM

## 2021-01-16 DIAGNOSIS — E1169 Type 2 diabetes mellitus with other specified complication: Secondary | ICD-10-CM | POA: Diagnosis not present

## 2021-01-16 DIAGNOSIS — I493 Ventricular premature depolarization: Secondary | ICD-10-CM | POA: Diagnosis not present

## 2021-01-16 MED ORDER — CARVEDILOL 6.25 MG PO TABS
6.2500 mg | ORAL_TABLET | Freq: Two times a day (BID) | ORAL | 3 refills | Status: DC
  Filled 2021-01-16: qty 180, 90d supply, fill #0

## 2021-01-16 MED FILL — Medroxyprogesterone Acetate Tab 10 MG: ORAL | 90 days supply | Qty: 90 | Fill #0 | Status: AC

## 2021-01-16 NOTE — Progress Notes (Signed)
Cardiology Office Note:    Date:  01/16/2021   ID:  Brandi Lester, DOB 1965-10-31, MRN [id number]  PCP:  Mosie Lukes, MD  Cardiologist:  Berniece Salines, DO  Electrophysiologist:  None   Referring MD: Mosie Lukes, MD   I feel a lot better  History of Present Illness:    Brandi Lester is a 55 y.o. female with a hx of diabetes mellitus, hypertension, hyperlipidemia, presented initially on October 19, 2020 to be evaluated for chest pain and palpitations.  At that time I placed a monitor on the patient as well as order a coronary CT and echocardiogram.  In the interim she was able to get her testing done.  We called the patient previously with her testing results.  Past Medical History:  Diagnosis Date  . ADD (attention deficit disorder)   . Allergy    allergic rhinitis  . Anemia 07/25/2013  . Angio-edema   . Anxiety   . Arthritis    not dx'd  . Back pain   . Costochondritis 02/20/2015  . Diabetes mellitus    Type II   previously 3 years ago - no longer on meds   . Dyslipidemia 07/25/2013  . Fungus infection 10/13   Left great toe  . GERD (gastroesophageal reflux disease)   . Hypertension   . IBS (irritable bowel syndrome)   . Ingrown nail 10/13   right foot next to the last toe  . Insomnia 04/20/2013  . Joint pain   . Lactose intolerance   . Leg edema   . Obesity   . Pedal edema 02/20/2015  . Prediabetes   . Preventative health care 09/25/2015  . PVC (premature ventricular contraction)     Past Surgical History:  Procedure Laterality Date  . HYSTEROSCOPY N/A 10/04/2015   Procedure: HYSTEROSCOPY with Removal IUD;  Surgeon: Vanessa Kick, MD;  Location: [facility];  Service: Gynecology;  Laterality: N/A;  . lasik     eye surgery  . WISDOM TOOTH EXTRACTION      Current Medications: Current Meds  Medication Sig  . acetaminophen (TYLENOL 8 HOUR) 650 MG CR tablet Take 1 tablet (650 mg total) every 8 (eight) hours as needed by mouth for pain.  [name]  amLODipine (NORVASC) 10 MG tablet Take 1 tablet (10 mg total) by mouth daily.  [name] atorvastatin (LIPITOR) 10 MG tablet TAKE 1 TABLET BY MOUTH ONCE DAILY  . azelastine (ASTELIN) 0.1 % nasal spray Place 2 sprays into both nostrils 2 (two) times daily. Use in each nostril as directed  . buPROPion (WELLBUTRIN SR) 150 MG 12 hr tablet TAKE 1 TABLET BY MOUTH EVERY MORNING  . carvedilol (COREG) 6.25 MG tablet Take 1 tablet (6.25 mg total) by mouth 2 (two) times daily.  . cholecalciferol (VITAMIN D3) 25 MCG (1000 UT) tablet Take 4,000 Units by mouth daily.  [name] EPINEPHrine (EPIPEN 2-PAK) 0.3 mg/0.3 mL IJ SOAJ injection Use as directed for severe allergic reaction  . fluticasone (FLONASE) 50 MCG/ACT nasal spray Place 2 sprays into both nostrils daily as needed for allergies.  . furosemide (LASIX) 20 MG tablet Take 1 tablet (20 mg total) by mouth daily as needed for edema.  . hydrochlorothiazide (HYDRODIURIL) 25 MG tablet Take 1 tablet (25 mg total) by mouth daily.  [name] ibuprofen (ADVIL,MOTRIN) 600 MG tablet Take 1 tablet (600 mg total) by mouth every 6 (six) hours as needed.  [name] losartan (COZAAR) 100 MG tablet Take 1 tablet (100 mg total) by mouth  daily.  . medroxyPROGESTERone (PROVERA) 10 MG tablet TAKE 1 TABLET (10 MG TOTAL) BY MOUTH DAILY.  . metFORMIN (GLUCOPHAGE) 500 MG tablet TAKE 1 TABLET BY MOUTH TWICE DAILY WITH A MEAL  . montelukast (SINGULAIR) 10 MG tablet Take 1 tablet (10 mg total) by mouth at bedtime as needed.  . Omega-3 Fatty Acids (FISH OIL PO) Take 1 capsule by mouth daily.  . ondansetron (ZOFRAN-ODT) 8 MG disintegrating tablet DISSOLVE 1 TABLET (8 MG TOTAL) BY MOUTH EVERY 8 (EIGHT) HOURS AS NEEDED FOR NAUSEA OR VOMITING.  . pantoprazole (PROTONIX) 40 MG tablet TAKE 1 TABLET BY MOUTH ONCE DAILY  . potassium chloride SA (KLOR-CON) 20 MEQ tablet TAKE 1 TABLET (20 MEQ TOTAL) BY MOUTH 3 (THREE) TIMES DAILY.  . Probiotic Product (PROBIOTIC-10 PO) Take by mouth as needed.   . Semaglutide,0.25 or  0.5MG /DOS, 2 MG/1.5ML SOPN INJECT 0.5MG  INTO THE SKIN ONCE A WEEK  . TURMERIC PO Take 1 capsule by mouth daily.  [name] Zoster Vaccine Adjuvanted [facility]) injection Inject into the muscle. (Patient taking differently: Inject 0.5 mLs into the muscle once.)  . [DISCONTINUED] metoprolol succinate (TOPROL-XL) 50 MG 24 hr tablet TAKE 1 TABLET BY MOUTH DAILY WITH OR IMMEDIATELY FOLLOWING A MEAL     Allergies:   Penicillins and Victoza [liraglutide]   Social History   Socioeconomic History  . Marital status: Single    Spouse name: Not on file  . Number of children: Not on file  . Years of education: Not on file  . Highest education level: Not on file  Occupational History  . Occupation: Pharmacist, hospital, [employer]  Tobacco Use  . Smoking status: Former Smoker    Packs/day: 1.00    Years: 15.00    Pack years: 15.00    Quit date: 09/03/2001    Years since quitting: 19.3  . Smokeless tobacco: Never Used  Vaping Use  . Vaping Use: Never used  Substance and Sexual Activity  . Alcohol use: Yes    Comment: rare  . Drug use: No  . Sexual activity: Yes    Birth control/protection: None  Other Topics Concern  . Not on file  Social History Narrative  . Not on file   Social Determinants of Health   Financial Resource Strain: Not on file  Food Insecurity: Not on file  Transportation Needs: Not on file  Physical Activity: Not on file  Stress: Not on file  Social Connections: Not on file     Family History: The patient's family history includes Cancer in her father; Heart attack (age of onset: 67) in her sister; Heart attack (age of onset: 31) in her mother; Heart disease in her mother; Hypertension in her mother and another family member; Obesity in her mother; Prostate cancer in her father. There is no history of Colon cancer, Colon polyps, Esophageal cancer, Stomach cancer, or Rectal cancer.  ROS:   Review of Systems  Constitution: Negative for decreased appetite, fever and weight gain.   HENT: Negative for congestion, ear discharge, hoarse voice and sore throat.   Eyes: Negative for discharge, redness, vision loss in right eye and visual halos.  Cardiovascular: Negative for chest pain, dyspnea on exertion, leg swelling, orthopnea and palpitations.  Respiratory: Negative for cough, hemoptysis, shortness of breath and snoring.   Endocrine: Negative for heat intolerance and polyphagia.  Hematologic/Lymphatic: Negative for bleeding problem. Does not bruise/bleed easily.  Skin: Negative for flushing, nail changes, rash and suspicious lesions.  Musculoskeletal: Negative for arthritis, joint pain, muscle  cramps, myalgias, neck pain and stiffness.  Gastrointestinal: Negative for abdominal pain, bowel incontinence, diarrhea and excessive appetite.  Genitourinary: Negative for decreased libido, genital sores and incomplete emptying.  Neurological: Negative for brief paralysis, focal weakness, headaches and loss of balance.  Psychiatric/Behavioral: Negative for altered mental status, depression and suicidal ideas.  Allergic/Immunologic: Negative for HIV exposure and persistent infections.    EKGs/Labs/Other Studies Reviewed:    The following studies were reviewed today:   EKG: None today  Transthoracic echocardiogram November 14, 2020 IMPRESSIONS  1. Left ventricular ejection fraction, by estimation, is 60 to 65%. The  left ventricle has normal function. The left ventricle has no regional  wall motion abnormalities. Left ventricular diastolic parameters are  consistent with Grade I diastolic  dysfunction (impaired relaxation).  2. Right ventricular systolic function is normal. The right ventricular  size is normal.  3. The mitral valve is normal in structure. No evidence of mitral valve  regurgitation. No evidence of mitral stenosis.  4. The aortic valve is normal in structure. Aortic valve regurgitation is  not visualized. No aortic stenosis is present.  5. The inferior  vena cava is normal in size with greater than 50%  respiratory variability, suggesting right atrial pressure of 3 mmHg.   ZIO monitor  The patient wore the monitor for 6 days 20 hours starting October 19, 2020. Indication: Palpitations  The minimum heart rate was 71 bpm, maximum heart rate was 120 bpm, and average heart rate was 71 bpm. Predominant underlying rhythm was Sinus Rhythm.   Premature atrial complexes were rare less than 1%. Premature Ventricular complexes were rare less than 1%.  No ventricular tachycardia, no pauses, No AV block, no supraventricular tachycardia and no atrial fibrillation present.  9 patient triggered events associated with premature atrial complexes and premature ventricular complex.  10 diary events associated with sinus rhythm and premature ventricular complex.   Conclusion: This study is remarkable for rare symptomatic premature atrial complex and premature ventricular complex.   Recent Labs: 10/20/2020: Magnesium 1.9 11/28/2020: ALT 12; BUN 9; Creat 0.73; Hemoglobin 13.5; Platelets 326; Potassium 3.6; Sodium 139; TSH 2.19  Recent Lipid Panel    Component Value Date/Time   CHOL 119 11/28/2020 1358   CHOL 129 07/07/2019 0900   TRIG 108 11/28/2020 1358   HDL 36 (L) 11/28/2020 1358   HDL 33 (L) 07/07/2019 0900   CHOLHDL 3.3 11/28/2020 1358   VLDL 13.0 03/21/2020 1007   LDLCALC 64 11/28/2020 1358    Physical Exam:    VS:  BP (!) 152/84   Pulse 89   Ht 5\' 10"  (1.778 m)   Wt (!) 313 lb 1.3 oz (142 kg)   SpO2 97%   BMI 44.92 kg/m     Wt Readings from Last 3 Encounters:  01/16/21 (!) 313 lb 1.3 oz (142 kg)  12/20/20 (!) 306 lb (138.8 kg)  12/05/20 (!) 312 lb 12.8 oz (141.9 kg)     GEN: Well nourished, well developed in no acute distress HEENT: Normal NECK: No JVD; No carotid bruits LYMPHATICS: No lymphadenopathy CARDIAC: S1S2 noted,RRR, no murmurs, rubs, gallops RESPIRATORY:  Clear to auscultation without rales, wheezing or  rhonchi  ABDOMEN: Soft, non-tender, non-distended, +bowel sounds, no guarding. EXTREMITIES: No edema, No cyanosis, no clubbing MUSCULOSKELETAL:  No deformity  SKIN: Warm and dry NEUROLOGIC:  Alert and oriented x 3, non-focal PSYCHIATRIC:  Normal affect, good insight  ASSESSMENT:    1. Symptomatic PVCs   2. PAC (premature atrial contraction)  3. Hypertension, unspecified type   4. Hyperlipidemia associated with type 2 diabetes mellitus ([place])    PLAN:     1.  She is hypertensive in the office today.  Her blood pressure was manually taken by me.  But unwilling to stop the metoprolol and start the patient on carvedilol 6.25 mg twice a day.  She will take her blood pressure for a week and send me this information in my chart if needed we will optimize her antihypertensive medication during that time. 2.  We can discuss her testing results all of her questions has been answered.  P 3.  The patient understands the need to lose weight with diet and exercise. We have discussed specific strategies for this.  The patient is in agreement with the above plan. The patient left the office in stable condition.  The patient will follow up in 6 months or sooner if needed.   Medication Adjustments/Labs and Tests Ordered: Current medicines are reviewed at length with the patient today.  Concerns regarding medicines are outlined above.  No orders of the defined types were placed in this encounter.  Meds ordered this encounter  Medications  . carvedilol (COREG) 6.25 MG tablet    Sig: Take 1 tablet (6.25 mg total) by mouth 2 (two) times daily.    Dispense:  180 tablet    Refill:  3    Patient Instructions  Medication Instructions:  Your physician has recommended you make the following change in your medication: STOP: Metoprolol  START: Coreg 3.25 mg twice daily *If you need a refill on your cardiac medications before your next appointment, please call your pharmacy*   Lab Work: None If you  have labs (blood work) drawn today and your tests are completely normal, you will receive your results only by: [name] MyChart Message (if you have MyChart) OR . A paper copy in the mail If you have any lab test that is abnormal or we need to change your treatment, we will call you to review the results.   Testing/Procedures: None   Follow-Up: At [facility], you and your health needs are our priority.  As part of our continuing mission to provide you with exceptional heart care, we have created designated Provider Care Teams.  These Care Teams include your primary Cardiologist (physician) and Advanced Practice Providers (APPs -  Physician Assistants and Nurse Practitioners) who all work together to provide you with the care you need, when you need it.  We recommend signing up for the patient portal called "MyChart".  Sign up information is provided on this After Visit Summary.  MyChart is used to connect with patients for Virtual Visits (Telemedicine).  Patients are able to view lab/test results, encounter notes, upcoming appointments, etc.  Non-urgent messages can be sent to your provider as well.   To learn more about what you can do with MyChart, go to [email or web address].    Your next appointment:   6 month(s)  The format for your next appointment:   In Person  Provider:   Berniece Salines, DO   Other Instructions  Please sent blood pressures through MyChart message.   [facility] at [address], [place], [place] [address]    Adopting a Healthy Lifestyle.  Know what a healthy weight is for you (roughly BMI <25) and aim to maintain this   Aim for 7+ servings of fruits and vegetables daily   65-80+ fluid ounces of water or unsweet  tea for healthy kidneys   Limit to max 1 drink of alcohol per day; avoid smoking/tobacco   Limit animal fats in diet for cholesterol and heart health - choose grass fed whenever available   Avoid  highly processed foods, and foods high in saturated/trans fats   Aim for low stress - take time to unwind and care for your mental health   Aim for 150 min of moderate intensity exercise weekly for heart health, and weights twice weekly for bone health   Aim for 7-9 hours of sleep daily   When it comes to diets, agreement about the perfect plan isnt easy to find, even among the experts. Experts at the [employer] developed an idea known as the Healthy Eating Plate. Just imagine a plate divided into logical, healthy portions.   The emphasis is on diet quality:   Load up on vegetables and fruits - one-half of your plate: Aim for color and variety, and remember that potatoes dont count.   Go for whole grains - one-quarter of your plate: Whole wheat, barley, wheat berries, quinoa, oats, brown rice, and foods made with them. If you want pasta, go with whole wheat pasta.   Protein power - one-quarter of your plate: Fish, chicken, beans, and nuts are all healthy, versatile protein sources. Limit red meat.   The diet, however, does go beyond the plate, offering a few other suggestions.   Use healthy plant oils, such as olive, canola, soy, corn, sunflower and peanut. Check the labels, and avoid partially hydrogenated oil, which have unhealthy trans fats.   If youre thirsty, drink water. Coffee and tea are good in moderation, but skip sugary drinks and limit milk and dairy products to one or two daily servings.   The type of carbohydrate in the diet is more important than the amount. Some sources of carbohydrates, such as vegetables, fruits, whole grains, and beans-are healthier than others.   Finally, stay active  Signed, Berniece Salines, DO  01/16/2021 4:36 PM    [facility]

## 2021-01-16 NOTE — Patient Instructions (Addendum)
Medication Instructions:  Your physician has recommended you make the following change in your medication: STOP: Metoprolol  START: Coreg 3.25 mg twice daily *If you need a refill on your cardiac medications before your next appointment, please call your pharmacy*   Lab Work: None If you have labs (blood work) drawn today and your tests are completely normal, you will receive your results only by: [name] MyChart Message (if you have MyChart) OR . A paper copy in the mail If you have any lab test that is abnormal or we need to change your treatment, we will call you to review the results.   Testing/Procedures: None   Follow-Up: At [facility], you and your health needs are our priority.  As part of our continuing mission to provide you with exceptional heart care, we have created designated Provider Care Teams.  These Care Teams include your primary Cardiologist (physician) and Advanced Practice Providers (APPs -  Physician Assistants and Nurse Practitioners) who all work together to provide you with the care you need, when you need it.  We recommend signing up for the patient portal called "MyChart".  Sign up information is provided on this After Visit Summary.  MyChart is used to connect with patients for Virtual Visits (Telemedicine).  Patients are able to view lab/test results, encounter notes, upcoming appointments, etc.  Non-urgent messages can be sent to your provider as well.   To learn more about what you can do with MyChart, go to [email or web address].    Your next appointment:   6 month(s)  The format for your next appointment:   In Person  Provider:   Berniece Salines, DO   Other Instructions  Please sent blood pressures through MyChart message.   [facility] at [address], [place], [place] [address]

## 2021-01-16 NOTE — Progress Notes (Signed)
ekg 

## 2021-01-22 ENCOUNTER — Encounter (INDEPENDENT_AMBULATORY_CARE_PROVIDER_SITE_OTHER): Payer: Self-pay | Admitting: Family Medicine

## 2021-01-22 DIAGNOSIS — E1169 Type 2 diabetes mellitus with other specified complication: Secondary | ICD-10-CM

## 2021-01-23 ENCOUNTER — Other Ambulatory Visit (HOSPITAL_BASED_OUTPATIENT_CLINIC_OR_DEPARTMENT_OTHER): Payer: Self-pay

## 2021-01-23 MED ORDER — SEMAGLUTIDE(0.25 OR 0.5MG/DOS) 2 MG/1.5ML ~~LOC~~ SOPN
PEN_INJECTOR | SUBCUTANEOUS | 0 refills | Status: DC
  Filled 2021-01-23: qty 1.5, 28d supply, fill #0

## 2021-01-23 NOTE — Telephone Encounter (Signed)
Pt last seen by Dawn Whitmire, FNP.  

## 2021-01-26 ENCOUNTER — Other Ambulatory Visit (HOSPITAL_BASED_OUTPATIENT_CLINIC_OR_DEPARTMENT_OTHER): Payer: Self-pay

## 2021-01-26 ENCOUNTER — Other Ambulatory Visit: Payer: Self-pay

## 2021-01-26 MED ORDER — CARVEDILOL 12.5 MG PO TABS
12.5000 mg | ORAL_TABLET | Freq: Two times a day (BID) | ORAL | 3 refills | Status: DC
  Filled 2021-01-26: qty 180, 90d supply, fill #0

## 2021-01-26 NOTE — Progress Notes (Signed)
Prescription sent to pharmacy.

## 2021-02-02 ENCOUNTER — Ambulatory Visit (INDEPENDENT_AMBULATORY_CARE_PROVIDER_SITE_OTHER): Payer: BC Managed Care – PPO | Admitting: Family Medicine

## 2021-02-02 ENCOUNTER — Other Ambulatory Visit (HOSPITAL_BASED_OUTPATIENT_CLINIC_OR_DEPARTMENT_OTHER): Payer: Self-pay

## 2021-02-02 ENCOUNTER — Other Ambulatory Visit: Payer: Self-pay

## 2021-02-02 ENCOUNTER — Encounter (INDEPENDENT_AMBULATORY_CARE_PROVIDER_SITE_OTHER): Payer: Self-pay | Admitting: Family Medicine

## 2021-02-02 VITALS — BP 140/82 | HR 97 | Temp 98.9°F | Ht 70.0 in | Wt 308.0 lb

## 2021-02-02 DIAGNOSIS — E785 Hyperlipidemia, unspecified: Secondary | ICD-10-CM

## 2021-02-02 DIAGNOSIS — Z6841 Body Mass Index (BMI) 40.0 and over, adult: Secondary | ICD-10-CM

## 2021-02-02 DIAGNOSIS — E1169 Type 2 diabetes mellitus with other specified complication: Secondary | ICD-10-CM | POA: Diagnosis not present

## 2021-02-02 DIAGNOSIS — F3289 Other specified depressive episodes: Secondary | ICD-10-CM

## 2021-02-02 MED ORDER — ATORVASTATIN CALCIUM 10 MG PO TABS
ORAL_TABLET | Freq: Every day | ORAL | 0 refills | Status: DC
  Filled 2021-02-02: qty 90, 90d supply, fill #0

## 2021-02-02 MED ORDER — OZEMPIC (1 MG/DOSE) 4 MG/3ML ~~LOC~~ SOPN
1.0000 mg | PEN_INJECTOR | SUBCUTANEOUS | 0 refills | Status: DC
  Filled 2021-02-02: qty 3, 28d supply, fill #0

## 2021-02-02 MED ORDER — BUPROPION HCL ER (SR) 150 MG PO TB12
ORAL_TABLET | Freq: Every morning | ORAL | 0 refills | Status: DC
  Filled 2021-02-02: qty 90, 90d supply, fill #0

## 2021-02-07 ENCOUNTER — Other Ambulatory Visit (HOSPITAL_BASED_OUTPATIENT_CLINIC_OR_DEPARTMENT_OTHER): Payer: Self-pay

## 2021-02-07 ENCOUNTER — Encounter (INDEPENDENT_AMBULATORY_CARE_PROVIDER_SITE_OTHER): Payer: Self-pay | Admitting: Family Medicine

## 2021-02-07 MED ORDER — CARVEDILOL 25 MG PO TABS
25.0000 mg | ORAL_TABLET | Freq: Two times a day (BID) | ORAL | 3 refills | Status: DC
  Filled 2021-02-07: qty 180, 90d supply, fill #0
  Filled 2021-05-11: qty 180, 90d supply, fill #1
  Filled 2021-08-09: qty 180, 90d supply, fill #2
  Filled 2021-11-12: qty 180, 90d supply, fill #3

## 2021-02-07 NOTE — Progress Notes (Signed)
Chief Complaint:   OBESITY Brandi Lester is here to discuss her progress with her obesity treatment plan along with follow-up of her obesity related diagnoses. Brandi Lester is on the Category 4 Plan and states she is following her eating plan approximately 75% of the time. Brandi Lester states she is walking and biking 45 minutes 3-4 times per week.  Today's visit was #: 67 Starting weight: 325 lbs Starting date: 05/23/2017 Today's weight: 308 lbs Today's date: 02/02/2021 Total lbs lost to date: 17 lbs Total lbs lost since last in-office visit: 0  Interim History: Brandi Lester notes that she has had treats brought in by parents at school which has caused her to snack more than usual. She is doing a good job with protein intake. Water intake is adequate.  Subjective:   1. Type 2 diabetes mellitus with other specified complication, without long-term current use of insulin (HCC) Well controlled. Brandi Lester's last A1c was 5.6. She is on Ozempic and Metformin. Hunger has increased recently.  Lab Results  Component Value Date   HGBA1C 5.6 11/28/2020   HGBA1C 6.1 03/21/2020   HGBA1C 5.9 (H) 12/01/2019   Lab Results  Component Value Date   MICROALBUR 0.50 12/15/2012   LDLCALC 64 11/28/2020   CREATININE 0.73 11/28/2020   Lab Results  Component Value Date   INSULIN 20.7 12/01/2019   INSULIN 12.9 07/07/2019   INSULIN 21.2 11/20/2018   INSULIN 15.4 07/14/2018   INSULIN 12.9 09/12/2017    2. Other depression, with emotional eating Mood stable. Brandi Lester reports cravings are well controlled.  3. Hyperlipidemia associated with type 2 diabetes mellitus ([place]) Brandi Lester's LDL is at goal. HDL is low.   Lab Results  Component Value Date   ALT 12 11/28/2020   AST 11 11/28/2020   ALKPHOS 68 03/21/2020   BILITOT 0.3 11/28/2020   Lab Results  Component Value Date   CHOL 119 11/28/2020   HDL 36 (L) 11/28/2020   LDLCALC 64 11/28/2020   TRIG 108 11/28/2020   CHOLHDL 3.3 11/28/2020    Assessment/Plan:    1. Type 2 diabetes mellitus with other specified complication, without long-term current use of insulin (HCC) -Increase Ozempic 1 mg weekly, as prescribed below. - Semaglutide, 1 MG/DOSE, (OZEMPIC, 1 MG/DOSE,) 4 MG/3ML SOPN; Inject 1 mg into the skin once a week.  Dispense: 3 mL; Refill: 0  2. Other depression, with emotional eating Refill: - buPROPion (WELLBUTRIN SR) 150 MG 12 hr tablet; TAKE 1 TABLET BY MOUTH EVERY MORNING  Dispense: 90 tablet; Refill: 0  3. Hyperlipidemia associated with type 2 diabetes mellitus (HCC) Refill: - atorvastatin (LIPITOR) 10 MG tablet; TAKE 1 TABLET BY MOUTH ONCE DAILY  Dispense: 90 tablet; Refill: 0  4. Obesity: BMI 44.19  Brandi Lester is currently in the action stage of change. As such, her goal is to continue with weight loss efforts. She has agreed to the Category 4 Plan.   Exercise goals: Add resistance training  Behavioral modification strategies: increasing lean protein intake and decreasing simple carbohydrates.  Brandi Lester has agreed to follow-up with our clinic in 4 weeks.  Objective:   Blood pressure 140/82, pulse 97, temperature 98.9 F (37.2 C), weight (!) 308 lb (139.7 kg), SpO2 97 %. Body mass index is 44.19 kg/m.  General: Cooperative, alert, well developed, in no acute distress. HEENT: Conjunctivae and lids unremarkable. Cardiovascular: Regular rhythm.  Lungs: Normal work of breathing. Neurologic: No focal deficits.   Lab Results  Component Value Date   CREATININE 0.73 11/28/2020  BUN 9 11/28/2020   NA 139 11/28/2020   K 3.6 11/28/2020   CL 100 11/28/2020   CO2 26 11/28/2020   Lab Results  Component Value Date   ALT 12 11/28/2020   AST 11 11/28/2020   ALKPHOS 68 03/21/2020   BILITOT 0.3 11/28/2020   Lab Results  Component Value Date   HGBA1C 5.6 11/28/2020   HGBA1C 6.1 03/21/2020   HGBA1C 5.9 (H) 12/01/2019   HGBA1C 5.9 (H) 07/07/2019   HGBA1C 6.1 (H) 11/20/2018   Lab Results  Component Value Date   INSULIN  20.7 12/01/2019   INSULIN 12.9 07/07/2019   INSULIN 21.2 11/20/2018   INSULIN 15.4 07/14/2018   INSULIN 12.9 09/12/2017   Lab Results  Component Value Date   TSH 2.19 11/28/2020   Lab Results  Component Value Date   CHOL 119 11/28/2020   HDL 36 (L) 11/28/2020   LDLCALC 64 11/28/2020   TRIG 108 11/28/2020   CHOLHDL 3.3 11/28/2020   Lab Results  Component Value Date   WBC 7.0 11/28/2020   HGB 13.5 11/28/2020   HCT 40.9 11/28/2020   MCV 85.7 11/28/2020   PLT 326 11/28/2020   No results found for: IRON, TIBC, FERRITIN  Attestation Statements:   Reviewed by clinician on day of visit: allergies, medications, problem list, medical history, surgical history, family history, social history, and previous encounter notes.  Coral Ceo, CMA, am acting as Location manager for [employer], [place].  I have reviewed the above documentation for accuracy and completeness, and I agree with the above. -  Georgianne Fick, FNP

## 2021-02-15 ENCOUNTER — Telehealth: Payer: Self-pay

## 2021-02-15 DIAGNOSIS — Z006 Encounter for examination for normal comparison and control in clinical research program: Secondary | ICD-10-CM

## 2021-02-15 NOTE — Telephone Encounter (Signed)
I called patient for his 90-day Identify Study follow up phone call. Pt stated she was on vacation. She requested I call her back on Monday 02/20/2021 for her follow up phone call.

## 2021-02-21 ENCOUNTER — Telehealth: Payer: Self-pay

## 2021-02-21 DIAGNOSIS — Z006 Encounter for examination for normal comparison and control in clinical research program: Secondary | ICD-10-CM

## 2021-02-21 NOTE — Telephone Encounter (Signed)
I have attempted without success to contact this patient by phone for her Identify 90 day follow up phone call. I left a message for patient to return my phone call with my name and callback number. An e-mail was also sent to patient.  

## 2021-03-02 ENCOUNTER — Other Ambulatory Visit (HOSPITAL_BASED_OUTPATIENT_CLINIC_OR_DEPARTMENT_OTHER): Payer: Self-pay

## 2021-03-02 ENCOUNTER — Ambulatory Visit (INDEPENDENT_AMBULATORY_CARE_PROVIDER_SITE_OTHER): Payer: BC Managed Care – PPO | Admitting: Family Medicine

## 2021-03-02 ENCOUNTER — Encounter (INDEPENDENT_AMBULATORY_CARE_PROVIDER_SITE_OTHER): Payer: Self-pay | Admitting: Family Medicine

## 2021-03-02 ENCOUNTER — Other Ambulatory Visit: Payer: Self-pay

## 2021-03-02 VITALS — BP 119/78 | HR 93 | Temp 98.1°F | Ht 70.0 in | Wt 296.0 lb

## 2021-03-02 DIAGNOSIS — Z6841 Body Mass Index (BMI) 40.0 and over, adult: Secondary | ICD-10-CM

## 2021-03-02 DIAGNOSIS — F3289 Other specified depressive episodes: Secondary | ICD-10-CM | POA: Diagnosis not present

## 2021-03-02 DIAGNOSIS — E1169 Type 2 diabetes mellitus with other specified complication: Secondary | ICD-10-CM | POA: Diagnosis not present

## 2021-03-02 MED ORDER — BUPROPION HCL ER (SR) 150 MG PO TB12
ORAL_TABLET | Freq: Every morning | ORAL | 0 refills | Status: DC
  Filled 2021-03-02: qty 90, 90d supply, fill #0

## 2021-03-02 MED ORDER — ONDANSETRON 8 MG PO TBDP
ORAL_TABLET | ORAL | 0 refills | Status: DC
  Filled 2021-03-02: qty 18, 21d supply, fill #0

## 2021-03-02 MED ORDER — METFORMIN HCL 500 MG PO TABS
ORAL_TABLET | Freq: Two times a day (BID) | ORAL | 0 refills | Status: DC
  Filled 2021-03-02: qty 180, 90d supply, fill #0

## 2021-03-02 MED ORDER — OZEMPIC (1 MG/DOSE) 4 MG/3ML ~~LOC~~ SOPN
1.0000 mg | PEN_INJECTOR | SUBCUTANEOUS | 0 refills | Status: DC
  Filled 2021-03-02: qty 3, 28d supply, fill #0

## 2021-03-09 NOTE — Progress Notes (Signed)
Chief Complaint:   OBESITY Brandi Lester is here to discuss her progress with her obesity treatment plan along with follow-up of her obesity related diagnoses. Brandi Lester is on the Category 4 Plan and states she is following her eating plan approximately 75% of the time. Brandi Lester states she has increased walking to 10,000 steps per day.   Today's visit was #: 59 Starting weight: 325 lbs Starting date: 05/23/2017 Today's weight: 296 lbs Today's date: 03/02/2021 Total lbs lost to date: 29 Total lbs lost since last in-office visit: 12  Interim History: Brandi Lester is down 12 lbs. She has had COVID and we have increased her dose of Ozempic last OV. Her hunger is well satisfied. However, he is only having 1 shake and one meal per day.  Subjective:   1. Type 2 diabetes mellitus with other specified complication, without long-term current use of insulin (HCC) Brandi Lester's diabetes mellitus is well controlled. Last A1c was 5.6. She is on Ozempic and metformin.   Lab Results  Component Value Date   HGBA1C 5.6 11/28/2020   HGBA1C 6.1 03/21/2020   HGBA1C 5.9 (H) 12/01/2019   Lab Results  Component Value Date   MICROALBUR 0.50 12/15/2012   LDLCALC 64 11/28/2020   CREATININE 0.73 11/28/2020   Lab Results  Component Value Date   INSULIN 20.7 12/01/2019   INSULIN 12.9 07/07/2019   INSULIN 21.2 11/20/2018   INSULIN 15.4 07/14/2018   INSULIN 12.9 09/12/2017   2. Other depression, with emotional eating Brandi Lester notes her cravings well controlled on bupropion.  Assessment/Plan:   1. Type 2 diabetes mellitus with other specified complication, without long-term current use of insulin (HCC) We will refill Zofran 8 mg for 1 month, we will refill Ozempic for 1 month, and we will refill metformin for 90 days with no refill.   - metFORMIN (GLUCOPHAGE) 500 MG tablet; TAKE 1 TABLET BY MOUTH TWICE DAILY WITH Lester MEAL  Dispense: 180 tablet; Refill: 0 - ondansetron (ZOFRAN-ODT) 8 MG disintegrating tablet;  DISSOLVE 1 TABLET (8 MG TOTAL) BY MOUTH EVERY 8 (EIGHT) HOURS AS NEEDED FOR NAUSEA OR VOMITING.  Dispense: 20 tablet; Refill: 0 - Semaglutide, 1 MG/DOSE, (OZEMPIC, 1 MG/DOSE,) 4 MG/3ML SOPN; Inject 1 mg into the skin once Lester week.  Dispense: 3 mL; Refill: 0  2. Other depression, with emotional eating We will refill bupropion 150 for 90 days with no refills. Orders and follow up as documented in patient record.   - buPROPion (WELLBUTRIN SR) 150 MG 12 hr tablet; TAKE 1 TABLET BY MOUTH EVERY MORNING  Dispense: 90 tablet; Refill: 0  3. Obesity: BMI 42.47 Brandi Lester is currently in the action stage of change. As such, her goal is to continue with weight loss efforts. She has agreed to the Category 4 Plan.   Brandi Lester will add 15-20 grams of protein per day.  Exercise goals: As is.  Behavioral modification strategies: increasing lean protein intake and decreasing simple carbohydrates.  Brandi Lester has agreed to follow-up with our clinic in 3 weeks.  Objective:   Blood pressure 119/78, pulse 93, temperature 98.1 F (36.7 C), temperature source Oral, height 5\' 10"  (1.778 m), weight 296 lb (134.3 kg), SpO2 97 %. Body mass index is 42.47 kg/m.  General: Cooperative, alert, well developed, in no acute distress. HEENT: Conjunctivae and lids unremarkable. Cardiovascular: Regular rhythm.  Lungs: Normal work of breathing. Neurologic: No focal deficits.   Lab Results  Component Value Date   CREATININE 0.73 11/28/2020   BUN 9 11/28/2020  NA 139 11/28/2020   K 3.6 11/28/2020   CL 100 11/28/2020   CO2 26 11/28/2020   Lab Results  Component Value Date   ALT 12 11/28/2020   AST 11 11/28/2020   ALKPHOS 68 03/21/2020   BILITOT 0.3 11/28/2020   Lab Results  Component Value Date   HGBA1C 5.6 11/28/2020   HGBA1C 6.1 03/21/2020   HGBA1C 5.9 (H) 12/01/2019   HGBA1C 5.9 (H) 07/07/2019   HGBA1C 6.1 (H) 11/20/2018   Lab Results  Component Value Date   INSULIN 20.7 12/01/2019   INSULIN 12.9  07/07/2019   INSULIN 21.2 11/20/2018   INSULIN 15.4 07/14/2018   INSULIN 12.9 09/12/2017   Lab Results  Component Value Date   TSH 2.19 11/28/2020   Lab Results  Component Value Date   CHOL 119 11/28/2020   HDL 36 (L) 11/28/2020   LDLCALC 64 11/28/2020   TRIG 108 11/28/2020   CHOLHDL 3.3 11/28/2020   Lab Results  Component Value Date   VD25OH 78 11/28/2020   VD25OH 56.44 03/21/2020   VD25OH 61.8 12/01/2019   Lab Results  Component Value Date   WBC 7.0 11/28/2020   HGB 13.5 11/28/2020   HCT 40.9 11/28/2020   MCV 85.7 11/28/2020   PLT 326 11/28/2020   No results found for: IRON, TIBC, FERRITIN  Attestation Statements:   Reviewed by clinician on day of visit: allergies, medications, problem list, medical history, surgical history, family history, social history, and previous encounter notes.   Wilhemena Durie, am acting as Location manager for [employer], FNP-C.  I have reviewed the above documentation for accuracy and completeness, and I agree with the above. -  Georgianne Fick, FNP

## 2021-03-13 ENCOUNTER — Encounter (INDEPENDENT_AMBULATORY_CARE_PROVIDER_SITE_OTHER): Payer: Self-pay | Admitting: Family Medicine

## 2021-03-23 ENCOUNTER — Encounter (INDEPENDENT_AMBULATORY_CARE_PROVIDER_SITE_OTHER): Payer: Self-pay | Admitting: Family Medicine

## 2021-03-23 ENCOUNTER — Other Ambulatory Visit: Payer: Self-pay

## 2021-03-23 ENCOUNTER — Other Ambulatory Visit (HOSPITAL_BASED_OUTPATIENT_CLINIC_OR_DEPARTMENT_OTHER): Payer: Self-pay

## 2021-03-23 ENCOUNTER — Ambulatory Visit (INDEPENDENT_AMBULATORY_CARE_PROVIDER_SITE_OTHER): Payer: BC Managed Care – PPO | Admitting: Family Medicine

## 2021-03-23 VITALS — BP 123/74 | HR 87 | Temp 98.5°F | Ht 70.0 in | Wt 297.0 lb

## 2021-03-23 DIAGNOSIS — Z6841 Body Mass Index (BMI) 40.0 and over, adult: Secondary | ICD-10-CM

## 2021-03-23 DIAGNOSIS — E1159 Type 2 diabetes mellitus with other circulatory complications: Secondary | ICD-10-CM | POA: Diagnosis not present

## 2021-03-23 DIAGNOSIS — I152 Hypertension secondary to endocrine disorders: Secondary | ICD-10-CM | POA: Diagnosis not present

## 2021-03-23 DIAGNOSIS — E1169 Type 2 diabetes mellitus with other specified complication: Secondary | ICD-10-CM | POA: Diagnosis not present

## 2021-03-23 DIAGNOSIS — Z9189 Other specified personal risk factors, not elsewhere classified: Secondary | ICD-10-CM

## 2021-03-23 MED ORDER — OZEMPIC (0.25 OR 0.5 MG/DOSE) 2 MG/1.5ML ~~LOC~~ SOPN
0.5000 mg | PEN_INJECTOR | SUBCUTANEOUS | 0 refills | Status: DC
  Filled 2021-03-23: qty 1.5, 28d supply, fill #0

## 2021-03-24 ENCOUNTER — Other Ambulatory Visit (HOSPITAL_BASED_OUTPATIENT_CLINIC_OR_DEPARTMENT_OTHER): Payer: Self-pay

## 2021-03-24 ENCOUNTER — Ambulatory Visit: Payer: BC Managed Care – PPO | Attending: Internal Medicine

## 2021-03-24 DIAGNOSIS — Z23 Encounter for immunization: Secondary | ICD-10-CM

## 2021-03-24 MED ORDER — ZOSTER VAC RECOMB ADJUVANTED 50 MCG/0.5ML IM SUSR
INTRAMUSCULAR | 0 refills | Status: DC
  Filled 2021-03-24: qty 1, 1d supply, fill #0

## 2021-03-24 NOTE — Progress Notes (Signed)
   Covid-19 Vaccination Clinic  Name:  Brandi Lester    MRN: [id number] DOB: Jul 09, 1966  03/24/2021  [name] was observed post Covid-19 immunization for 15 minutes without incident. She was provided with Vaccine Information Sheet and instruction to access the V-Safe system.   [name] was instructed to call 911 with any severe reactions post vaccine: Difficulty breathing  Swelling of face and throat  A fast heartbeat  A bad rash all over body  Dizziness and weakness   Immunizations Administered     Name Date Dose VIS Date Route   PFIZER Comrnaty(Gray TOP) Covid-19 Vaccine 03/24/2021  9:57 AM 0.3 mL 08/11/2020 Intramuscular   Manufacturer: [place]   Lot: [id number]   Vander: [phone]

## 2021-03-27 ENCOUNTER — Other Ambulatory Visit (HOSPITAL_BASED_OUTPATIENT_CLINIC_OR_DEPARTMENT_OTHER): Payer: Self-pay

## 2021-03-27 MED ORDER — COVID-19 MRNA VAC-TRIS(PFIZER) 30 MCG/0.3ML IM SUSP
INTRAMUSCULAR | 0 refills | Status: DC
  Filled 2021-03-27: qty 0.3, 1d supply, fill #0

## 2021-03-29 ENCOUNTER — Other Ambulatory Visit (HOSPITAL_BASED_OUTPATIENT_CLINIC_OR_DEPARTMENT_OTHER): Payer: Self-pay

## 2021-03-30 NOTE — Progress Notes (Signed)
Chief Complaint:   OBESITY Brandi Lester is here to discuss her progress with her obesity treatment plan along with follow-up of her obesity related diagnoses. Brandi Lester is on the Category 4 Plan and states she is following her eating plan approximately 80% of the time. Brandi Lester states she is walking for 30 minutes 3 times per week and doing an exercise bike for 45 minutes 4 times per week.  Today's visit was #: 29 Starting weight: 325 lbs Starting date: 05/23/2017 Today's weight: 297 lbs Today's date: 03/23/2021 Total lbs lost to date: 28 lbs Total lbs lost since last in-office visit: 0  Interim History: Brandi Lester notes she has been keeping track of her protein and knows she is not eating enough. She is not always hungry at breakfast and sometimes skips it.  Subjective:   1. Type 2 diabetes mellitus with other specified complication, without long-term current use of insulin (HCC) Torrie's diabetes is well controlled. Her last A1C level was 5.6. She had some nausea with Ozempic and vomited on 1 mg.  Lab Results  Component Value Date   HGBA1C 5.6 11/28/2020   HGBA1C 6.1 03/21/2020   HGBA1C 5.9 (H) 12/01/2019   Lab Results  Component Value Date   MICROALBUR 0.50 12/15/2012   LDLCALC 64 11/28/2020   CREATININE 0.73 11/28/2020   Lab Results  Component Value Date   INSULIN 20.7 12/01/2019   INSULIN 12.9 07/07/2019   INSULIN 21.2 11/20/2018   INSULIN 15.4 07/14/2018   INSULIN 12.9 09/12/2017    2. Hypertension associated with type 2 diabetes mellitus ([place]) Kaneisha's hypertension is well controlled.  Managed by cardiology.  She is on Carvedilol, Hydrochlorothiazide, Lasix, and Losartan.  BP Readings from Last 3 Encounters:  03/23/21 123/74  03/02/21 119/78  02/02/21 140/82    3. At risk for deficient intake of food Brandi Lester is at risk for deficient intake of food duet to appetite from Ozempic and inadequate protein.  Assessment/Plan:   1. Type 2 diabetes mellitus with other  specified complication, without long-term current use of insulin ([place]) Brandi Lester agreed to decrease dose of Ozempic to 0.50 mg weekly with no refills. We decided to reduce her dose due to nausea from 1 mg to 0.5 mg.   - Semaglutide,0.25 or 0.'5MG'$ /DOS, (OZEMPIC, 0.25 OR 0.5 MG/DOSE,) 2 MG/1.5ML SOPN; Inject 0.5 mg into the skin once a week.  Dispense: 1.5 mL; Refill: 0  2. Hypertension associated with type 2 diabetes mellitus (Brandi Lester) Netanya will continue all medications and fu with cardiology as directed.    3. At risk for deficient intake of food Brandi Lester was given approximately 15 minutes of deficit intake of food prevention counseling today. Brandi Lester is at risk for eating too few calories based on current food recall. She was encouraged to focus on meeting caloric and protein goals according to her recommended meal plan.    4. Obesity: BMI 42.61 Brandi Lester is currently in the action stage of change. As such, her goal is to continue with weight loss efforts. She has agreed to the Category 4 Plan and 90 grams of protein.  Autie tracks protein. Her goal is 90 grams per day.  Exercise goals:  As is.  Behavioral modification strategies: increasing lean protein intake.  Ageliki has agreed to follow-up with our clinic in 3 weeks.  Objective:   Blood pressure 123/74, pulse 87, temperature 98.5 F (36.9 C), height '5\' 10"'$  (1.778 m), weight 297 lb (134.7 kg), SpO2 98 %. Body mass index is 42.62 kg/m.  General: Cooperative, alert, well developed, in no acute distress. HEENT: Conjunctivae and lids unremarkable. Cardiovascular: Regular rhythm.  Lungs: Normal work of breathing. Neurologic: No focal deficits.   Lab Results  Component Value Date   CREATININE 0.73 11/28/2020   BUN 9 11/28/2020   NA 139 11/28/2020   K 3.6 11/28/2020   CL 100 11/28/2020   CO2 26 11/28/2020   Lab Results  Component Value Date   ALT 12 11/28/2020   AST 11 11/28/2020   ALKPHOS 68 03/21/2020   BILITOT 0.3  11/28/2020   Lab Results  Component Value Date   HGBA1C 5.6 11/28/2020   HGBA1C 6.1 03/21/2020   HGBA1C 5.9 (H) 12/01/2019   HGBA1C 5.9 (H) 07/07/2019   HGBA1C 6.1 (H) 11/20/2018   Lab Results  Component Value Date   INSULIN 20.7 12/01/2019   INSULIN 12.9 07/07/2019   INSULIN 21.2 11/20/2018   INSULIN 15.4 07/14/2018   INSULIN 12.9 09/12/2017   Lab Results  Component Value Date   TSH 2.19 11/28/2020   Lab Results  Component Value Date   CHOL 119 11/28/2020   HDL 36 (L) 11/28/2020   LDLCALC 64 11/28/2020   TRIG 108 11/28/2020   CHOLHDL 3.3 11/28/2020   Lab Results  Component Value Date   VD25OH 78 11/28/2020   VD25OH 56.44 03/21/2020   VD25OH 61.8 12/01/2019   Lab Results  Component Value Date   WBC 7.0 11/28/2020   HGB 13.5 11/28/2020   HCT 40.9 11/28/2020   MCV 85.7 11/28/2020   PLT 326 11/28/2020   No results found for: IRON, TIBC, FERRITIN  Attestation Statements:   Reviewed by clinician on day of visit: allergies, medications, problem list, medical history, surgical history, family history, social history, and previous encounter notes.  I, Lizbeth Bark, RMA, am acting as Location manager for [employer], [place].   I have reviewed the above documentation for accuracy and completeness, and I agree with the above. -  Georgianne Fick, FNP

## 2021-04-12 ENCOUNTER — Other Ambulatory Visit: Payer: Self-pay | Admitting: Family Medicine

## 2021-04-12 ENCOUNTER — Other Ambulatory Visit (HOSPITAL_BASED_OUTPATIENT_CLINIC_OR_DEPARTMENT_OTHER): Payer: Self-pay

## 2021-04-12 MED ORDER — PANTOPRAZOLE SODIUM 40 MG PO TBEC
DELAYED_RELEASE_TABLET | Freq: Every day | ORAL | 1 refills | Status: DC
  Filled 2021-04-12: qty 90, 90d supply, fill #0
  Filled 2021-09-04: qty 90, 90d supply, fill #1

## 2021-04-12 MED ORDER — MEDROXYPROGESTERONE ACETATE 10 MG PO TABS
ORAL_TABLET | Freq: Every day | ORAL | 3 refills | Status: DC
  Filled 2021-04-12: qty 90, 90d supply, fill #0
  Filled 2021-07-18: qty 90, 90d supply, fill #1
  Filled 2021-10-05: qty 90, 90d supply, fill #2
  Filled 2022-01-08: qty 90, 90d supply, fill #3

## 2021-04-13 ENCOUNTER — Encounter (INDEPENDENT_AMBULATORY_CARE_PROVIDER_SITE_OTHER): Payer: Self-pay | Admitting: Family Medicine

## 2021-04-13 ENCOUNTER — Ambulatory Visit (INDEPENDENT_AMBULATORY_CARE_PROVIDER_SITE_OTHER): Payer: BC Managed Care – PPO | Admitting: Family Medicine

## 2021-04-13 ENCOUNTER — Other Ambulatory Visit: Payer: Self-pay

## 2021-04-13 ENCOUNTER — Other Ambulatory Visit (HOSPITAL_BASED_OUTPATIENT_CLINIC_OR_DEPARTMENT_OTHER): Payer: Self-pay

## 2021-04-13 VITALS — BP 113/74 | HR 95 | Temp 98.7°F | Ht 70.0 in | Wt 291.0 lb

## 2021-04-13 DIAGNOSIS — E1169 Type 2 diabetes mellitus with other specified complication: Secondary | ICD-10-CM

## 2021-04-13 DIAGNOSIS — Z9189 Other specified personal risk factors, not elsewhere classified: Secondary | ICD-10-CM | POA: Diagnosis not present

## 2021-04-13 DIAGNOSIS — I152 Hypertension secondary to endocrine disorders: Secondary | ICD-10-CM | POA: Diagnosis not present

## 2021-04-13 DIAGNOSIS — E1159 Type 2 diabetes mellitus with other circulatory complications: Secondary | ICD-10-CM | POA: Diagnosis not present

## 2021-04-13 DIAGNOSIS — Z6841 Body Mass Index (BMI) 40.0 and over, adult: Secondary | ICD-10-CM

## 2021-04-13 MED ORDER — SEMAGLUTIDE (1 MG/DOSE) 4 MG/3ML ~~LOC~~ SOPN
1.0000 mg | PEN_INJECTOR | SUBCUTANEOUS | 0 refills | Status: DC
Start: 2021-04-13 — End: 2021-05-11
  Filled 2021-04-13: qty 3, 28d supply, fill #0

## 2021-04-13 NOTE — Telephone Encounter (Signed)
Please advise 

## 2021-04-17 ENCOUNTER — Encounter (INDEPENDENT_AMBULATORY_CARE_PROVIDER_SITE_OTHER): Payer: Self-pay | Admitting: Family Medicine

## 2021-04-17 NOTE — Progress Notes (Signed)
Chief Complaint:   OBESITY Brandi Lester is here to discuss her progress with her obesity treatment plan along with follow-up of her obesity related diagnoses. Brandi Lester is on the Category 4 Plan plus 90 grams of protein and states she is following her eating plan approximately 75% of the time. Brandi Lester states she is riding a bike and walking for 45 minutes 2 times per week.  Today's visit was #: 60 Starting weight: 325 lbs Starting date: 05/23/2017 Today's weight: 291 lbs Today's date: 04/13/2021 Total lbs lost to date: 34 lbs Total lbs lost since last in-office visit: 6 lbs  Interim History: Willodean notes her appetite is well controlled. 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 She is concerned about not having time for lunch and also having to eat lunch around kids without masks.  Subjective:   1. Type 2 diabetes mellitus with other specified complication, without long-term current use of insulin (HCC) Brandi Lester is well controlled. She has some nausea with 1 mg Ozempic so we decreased dose to 0.5 mg. She has since increased dose back to 1 mg by using 2 injections of 0.'5mg'$ .  Lab Results  Component Value Date   HGBA1C 5.6 11/28/2020   HGBA1C 6.1 03/21/2020   HGBA1C 5.9 (H) 12/01/2019   Lab Results  Component Value Date   MICROALBUR 0.50 12/15/2012   LDLCALC 64 11/28/2020   CREATININE 0.73 11/28/2020   Lab Results  Component Value Date   INSULIN 20.7 12/01/2019   INSULIN 12.9 07/07/2019   INSULIN 21.2 11/20/2018   INSULIN 15.4 07/14/2018   INSULIN 12.9 09/12/2017    2. Hypertension associated with type 2 diabetes mellitus ([place]) Brandi Lester's blood pressure is very well controlled. She is concerned once school starts her blood pressure will not be well controlled due to stress. She is on Norvasc, Carvedilol, Lasix and Losartan.  BP Readings from Last 3 Encounters:  04/13/21 113/74  03/23/21 123/74  03/02/21 119/78     3. At risk for nausea Brandi Lester is at  risk for nausea due to increased dose of Ozempic. .  Assessment/Plan:   1. Type 2 diabetes mellitus with other specified complication, without long-term current use of insulin ([place])  Brandi Lester will increase dose to 1 mg. Brandi Lester agreed to start new prescription of Ozempic 1 mg weekly. I discussed with her to take [id number] mg (55 clicks) if needed due to nausea.  - Semaglutide, 1 MG/DOSE, 4 MG/3ML SOPN; Inject 1 mg as directed once a week.  Dispense: 3 mL; Refill: 0  2. Hypertension associated with type 2 diabetes mellitus ([place]) Brandi Lester will check blood pressure 2-3 days per week after school. She will contact cardiology if blood pressure is greater than 140/90.    3. At risk for nausea Brandi Lester Brandi Lester was given approximately 15 minutes of nausea prevention counseling today. Brandi Lester is at risk for nausea due to her new or current medication. She was encouraged to titrate her medication slowly, make sure to stay hydrated, eat smaller portions throughout the day, and avoid high fat meals.    4. Obesity: BMI 41.75 Shanteria is currently in the action stage of change. As such, her goal is to continue with weight loss efforts. She has agreed to the Category 4 Plan and 90 grams of protein.   Brandi Lester made plan for protein snacks in afternoon if no lunch.  Exercise goals:  As is.  Behavioral modification strategies: increasing lean protein intake, meal planning and cooking  strategies, and better snacking choices.  Brandi Lester has agreed to follow-up with our clinic in 3-4 weeks.  Objective:   Blood pressure 113/74, pulse 95, temperature 98.7 F (37.1 C), height '5\' 10"'$  (1.778 m), weight 291 lb (132 kg), SpO2 98 %. Body mass index is 41.75 kg/m.  General: Cooperative, alert, well developed, in no acute distress. HEENT: Conjunctivae and lids unremarkable. Cardiovascular: Regular rhythm.  Lungs: Normal work of breathing. Neurologic: No focal deficits.   Lab Results  Component Value Date    CREATININE 0.73 11/28/2020   BUN 9 11/28/2020   NA 139 11/28/2020   K 3.6 11/28/2020   CL 100 11/28/2020   CO2 26 11/28/2020   Lab Results  Component Value Date   ALT 12 11/28/2020   AST 11 11/28/2020   ALKPHOS 68 03/21/2020   BILITOT 0.3 11/28/2020   Lab Results  Component Value Date   HGBA1C 5.6 11/28/2020   HGBA1C 6.1 03/21/2020   HGBA1C 5.9 (H) 12/01/2019   HGBA1C 5.9 (H) 07/07/2019   HGBA1C 6.1 (H) 11/20/2018   Lab Results  Component Value Date   INSULIN 20.7 12/01/2019   INSULIN 12.9 07/07/2019   INSULIN 21.2 11/20/2018   INSULIN 15.4 07/14/2018   INSULIN 12.9 09/12/2017   Lab Results  Component Value Date   TSH 2.19 11/28/2020   Lab Results  Component Value Date   CHOL 119 11/28/2020   HDL 36 (L) 11/28/2020   LDLCALC 64 11/28/2020   TRIG 108 11/28/2020   CHOLHDL 3.3 11/28/2020   Lab Results  Component Value Date   VD25OH 78 11/28/2020   VD25OH 56.44 03/21/2020   VD25OH 61.8 12/01/2019   Lab Results  Component Value Date   WBC 7.0 11/28/2020   HGB 13.5 11/28/2020   HCT 40.9 11/28/2020   MCV 85.7 11/28/2020   PLT 326 11/28/2020   No results found for: IRON, TIBC, FERRITIN  Attestation Statements:   Reviewed by clinician on day of visit: allergies, medications, problem list, medical history, surgical history, family history, social history, and previous encounter notes.  I, Lizbeth Bark, RMA, am acting as Location manager for [employer], [place].   I have reviewed the above documentation for accuracy and completeness, and I agree with the above. -  Georgianne Fick, FNP

## 2021-05-11 ENCOUNTER — Other Ambulatory Visit: Payer: Self-pay

## 2021-05-11 ENCOUNTER — Other Ambulatory Visit (HOSPITAL_BASED_OUTPATIENT_CLINIC_OR_DEPARTMENT_OTHER): Payer: Self-pay

## 2021-05-11 ENCOUNTER — Ambulatory Visit (INDEPENDENT_AMBULATORY_CARE_PROVIDER_SITE_OTHER): Payer: BC Managed Care – PPO | Admitting: Family Medicine

## 2021-05-11 ENCOUNTER — Encounter (INDEPENDENT_AMBULATORY_CARE_PROVIDER_SITE_OTHER): Payer: Self-pay | Admitting: Family Medicine

## 2021-05-11 VITALS — BP 132/78 | HR 91 | Temp 98.7°F | Ht 70.0 in | Wt 298.0 lb

## 2021-05-11 DIAGNOSIS — F3289 Other specified depressive episodes: Secondary | ICD-10-CM | POA: Diagnosis not present

## 2021-05-11 DIAGNOSIS — Z9189 Other specified personal risk factors, not elsewhere classified: Secondary | ICD-10-CM

## 2021-05-11 DIAGNOSIS — E1169 Type 2 diabetes mellitus with other specified complication: Secondary | ICD-10-CM | POA: Diagnosis not present

## 2021-05-11 DIAGNOSIS — E785 Hyperlipidemia, unspecified: Secondary | ICD-10-CM | POA: Diagnosis not present

## 2021-05-11 DIAGNOSIS — Z6841 Body Mass Index (BMI) 40.0 and over, adult: Secondary | ICD-10-CM

## 2021-05-11 MED ORDER — METFORMIN HCL 500 MG PO TABS
500.0000 mg | ORAL_TABLET | Freq: Two times a day (BID) | ORAL | 0 refills | Status: DC
  Filled 2021-05-11: qty 180, fill #0
  Filled 2021-06-07: qty 180, 90d supply, fill #0

## 2021-05-11 MED ORDER — ATORVASTATIN CALCIUM 10 MG PO TABS
ORAL_TABLET | Freq: Every day | ORAL | 0 refills | Status: DC
  Filled 2021-05-11: qty 90, 90d supply, fill #0

## 2021-05-11 MED ORDER — SEMAGLUTIDE (1 MG/DOSE) 4 MG/3ML ~~LOC~~ SOPN
1.0000 mg | PEN_INJECTOR | SUBCUTANEOUS | 0 refills | Status: DC
  Filled 2021-05-11: qty 3, 28d supply, fill #0

## 2021-05-11 MED ORDER — BUPROPION HCL ER (SR) 150 MG PO TB12
150.0000 mg | ORAL_TABLET | Freq: Every morning | ORAL | 0 refills | Status: DC
Start: 2021-05-11 — End: 2021-08-22
  Filled 2021-05-11: qty 90, fill #0
  Filled 2021-06-07: qty 90, 90d supply, fill #0

## 2021-05-12 ENCOUNTER — Other Ambulatory Visit (HOSPITAL_BASED_OUTPATIENT_CLINIC_OR_DEPARTMENT_OTHER): Payer: Self-pay

## 2021-05-12 NOTE — Progress Notes (Signed)
Chief Complaint:   OBESITY Brandi Lester is here to discuss her progress with her obesity treatment plan along with follow-up of her obesity related diagnoses. Tejal is on the Category 4 Plan plus 90 grams protein and states she is following her eating plan approximately 75% of the time. Cody states she is not currently exercising.  Today's visit was #: 17 Starting weight: 325 lbs Starting date: 05/23/2017 Today's weight: 298 lbs Today's date: 05/11/2021 Total lbs lost to date: 27 Total lbs lost since last in-office visit: 0  Interim History: Brandi Lester is doing well on Ozempic and meal plan. The last time she went to get refill of Ozempic, she was told it was on backorder. She has a protein shake in the mornings. Lunch was [employer] microwave meal and supper is where pt struggles. Her birthday is coming up and she is likely going out to eat with her family.  Subjective:   1. Type 2 diabetes mellitus with other specified complication, without long-term current use of insulin (HCC) Pt is on Ozempic 1 mg and occasionally feels nausea. She is also on Metformin.  2. Hyperlipidemia associated with type 2 diabetes mellitus ([place]) Brandi Lester is doing well on Lipitor and denies transaminitis or myalgias.  3. Other depression, with emotional eating Pt denies suicidal or homicidal ideations. She is taking Wellbutrin.  4. At risk for deficient intake of food Amariona is at risk for deficient intake of food due to [place].  Assessment/Plan:   1. Type 2 diabetes mellitus with other specified complication, without long-term current use of insulin (HCC) Good blood sugar control is important to decrease the likelihood of diabetic complications such as nephropathy, neuropathy, limb loss, blindness, coronary artery disease, and death. Intensive lifestyle modification including diet, exercise and weight loss are the first line of treatment for diabetes.   Refill- Semaglutide, 1 MG/DOSE, 4 MG/3ML  SOPN; Inject 1 mg as directed once a week.  Dispense: 3 mL; Refill: 0 Refill- metFORMIN (GLUCOPHAGE) 500 MG tablet; TAKE 1 TABLET BY MOUTH TWICE DAILY WITH A MEAL  Dispense: 180 tablet; Refill: 0  2. Hyperlipidemia associated with type 2 diabetes mellitus ([place]) Cardiovascular risk and specific lipid/LDL goals reviewed.  We discussed several lifestyle modifications today and Pauleen will continue to work on diet, exercise and weight loss efforts. Orders and follow up as documented in patient record.   Counseling Intensive lifestyle modifications are the first line treatment for this issue. Dietary changes: Increase soluble fiber. Decrease simple carbohydrates. Exercise changes: Moderate to vigorous-intensity aerobic activity 150 minutes per week if tolerated. Lipid-lowering medications: see documented in medical record.  Refill- atorvastatin (LIPITOR) 10 MG tablet; TAKE 1 TABLET BY MOUTH ONCE DAILY  Dispense: 90 tablet; Refill: 0  3. Other depression, with emotional eating Behavior modification techniques were discussed today to help Brandi Lester deal with her emotional/non-hunger eating behaviors.  Orders and follow up as documented in patient record.   Refill- buPROPion (WELLBUTRIN SR) 150 MG 12 hr tablet; TAKE 1 TABLET BY MOUTH EVERY MORNING  Dispense: 90 tablet; Refill: 0  4. At risk for deficient intake of food Brandi Lester was given approximately 15 minutes of deficit intake of food prevention counseling today. Brandi Lester is at risk for eating too few calories based on current food recall. She was encouraged to focus on meeting caloric and protein goals according to her recommended meal plan.   5. Obesity: BMI 41.75  Brandi Lester is currently in the action stage of change. As such, her goal is to  continue with weight loss efforts. She has agreed to keeping a food journal and adhering to recommended goals of 1600-1800 calories and 125+ Grams protein.   Exercise goals: All adults should avoid inactivity.  Some physical activity is better than none, and adults who participate in any amount of physical activity gain some health benefits.  Behavioral modification strategies: increasing lean protein intake, no skipping meals, meal planning and cooking strategies, and keeping healthy foods in the home.  Brandi Lester has agreed to follow-up with our clinic in 3 weeks. She was informed of the importance of frequent follow-up visits to maximize her success with intensive lifestyle modifications for her multiple health conditions.   Objective:   Blood pressure 132/78, pulse 91, temperature 98.7 F (37.1 C), height '5\' 10"'$  (1.778 m), weight 298 lb (135.2 kg), SpO2 99 %. Body mass index is 42.76 kg/m.  General: Cooperative, alert, well developed, in no acute distress. HEENT: Conjunctivae and lids unremarkable. Cardiovascular: Regular rhythm.  Lungs: Normal work of breathing. Neurologic: No focal deficits.   Lab Results  Component Value Date   CREATININE 0.73 11/28/2020   BUN 9 11/28/2020   NA 139 11/28/2020   K 3.6 11/28/2020   CL 100 11/28/2020   CO2 26 11/28/2020   Lab Results  Component Value Date   ALT 12 11/28/2020   AST 11 11/28/2020   ALKPHOS 68 03/21/2020   BILITOT 0.3 11/28/2020   Lab Results  Component Value Date   HGBA1C 5.6 11/28/2020   HGBA1C 6.1 03/21/2020   HGBA1C 5.9 (H) 12/01/2019   HGBA1C 5.9 (H) 07/07/2019   HGBA1C 6.1 (H) 11/20/2018   Lab Results  Component Value Date   INSULIN 20.7 12/01/2019   INSULIN 12.9 07/07/2019   INSULIN 21.2 11/20/2018   INSULIN 15.4 07/14/2018   INSULIN 12.9 09/12/2017   Lab Results  Component Value Date   TSH 2.19 11/28/2020   Lab Results  Component Value Date   CHOL 119 11/28/2020   HDL 36 (L) 11/28/2020   LDLCALC 64 11/28/2020   TRIG 108 11/28/2020   CHOLHDL 3.3 11/28/2020   Lab Results  Component Value Date   VD25OH 78 11/28/2020   VD25OH 56.44 03/21/2020   VD25OH 61.8 12/01/2019   Lab Results  Component Value Date    WBC 7.0 11/28/2020   HGB 13.5 11/28/2020   HCT 40.9 11/28/2020   MCV 85.7 11/28/2020   PLT 326 11/28/2020    Attestation Statements:   Reviewed by clinician on day of visit: allergies, medications, problem list, medical history, surgical history, family history, social history, and previous encounter notes.  Coral Ceo, CMA, am acting as transcriptionist for Coralie Common, MD.   I have reviewed the above documentation for accuracy and completeness, and I agree with the above. - Coralie Common, MD

## 2021-06-01 ENCOUNTER — Other Ambulatory Visit: Payer: Self-pay | Admitting: Family Medicine

## 2021-06-01 ENCOUNTER — Ambulatory Visit (INDEPENDENT_AMBULATORY_CARE_PROVIDER_SITE_OTHER): Payer: BC Managed Care – PPO | Admitting: Family Medicine

## 2021-06-01 ENCOUNTER — Other Ambulatory Visit (HOSPITAL_BASED_OUTPATIENT_CLINIC_OR_DEPARTMENT_OTHER): Payer: Self-pay

## 2021-06-01 DIAGNOSIS — E1159 Type 2 diabetes mellitus with other circulatory complications: Secondary | ICD-10-CM

## 2021-06-01 MED ORDER — AMLODIPINE BESYLATE 10 MG PO TABS
10.0000 mg | ORAL_TABLET | Freq: Every day | ORAL | 1 refills | Status: DC
  Filled 2021-06-01: qty 90, 90d supply, fill #0
  Filled 2021-08-22: qty 90, 90d supply, fill #1

## 2021-06-01 MED ORDER — HYDROCHLOROTHIAZIDE 25 MG PO TABS
25.0000 mg | ORAL_TABLET | Freq: Every day | ORAL | 1 refills | Status: DC
  Filled 2021-06-01: qty 90, 90d supply, fill #0
  Filled 2021-08-22: qty 90, 90d supply, fill #1

## 2021-06-06 ENCOUNTER — Other Ambulatory Visit: Payer: Self-pay

## 2021-06-06 ENCOUNTER — Other Ambulatory Visit (HOSPITAL_BASED_OUTPATIENT_CLINIC_OR_DEPARTMENT_OTHER): Payer: Self-pay

## 2021-06-06 ENCOUNTER — Encounter: Payer: Self-pay | Admitting: Family Medicine

## 2021-06-06 ENCOUNTER — Ambulatory Visit: Payer: BC Managed Care – PPO | Attending: Internal Medicine

## 2021-06-06 ENCOUNTER — Ambulatory Visit: Payer: BC Managed Care – PPO | Admitting: Family Medicine

## 2021-06-06 VITALS — BP 124/78 | HR 100 | Temp 99.1°F | Resp 16 | Wt 292.6 lb

## 2021-06-06 DIAGNOSIS — E781 Pure hyperglyceridemia: Secondary | ICD-10-CM

## 2021-06-06 DIAGNOSIS — Z23 Encounter for immunization: Secondary | ICD-10-CM | POA: Diagnosis not present

## 2021-06-06 DIAGNOSIS — I152 Hypertension secondary to endocrine disorders: Secondary | ICD-10-CM

## 2021-06-06 DIAGNOSIS — E6609 Other obesity due to excess calories: Secondary | ICD-10-CM

## 2021-06-06 DIAGNOSIS — E785 Hyperlipidemia, unspecified: Secondary | ICD-10-CM

## 2021-06-06 DIAGNOSIS — E1169 Type 2 diabetes mellitus with other specified complication: Secondary | ICD-10-CM

## 2021-06-06 DIAGNOSIS — R002 Palpitations: Secondary | ICD-10-CM | POA: Diagnosis not present

## 2021-06-06 DIAGNOSIS — E559 Vitamin D deficiency, unspecified: Secondary | ICD-10-CM

## 2021-06-06 DIAGNOSIS — E1159 Type 2 diabetes mellitus with other circulatory complications: Secondary | ICD-10-CM

## 2021-06-06 DIAGNOSIS — I10 Essential (primary) hypertension: Secondary | ICD-10-CM | POA: Diagnosis not present

## 2021-06-06 DIAGNOSIS — E669 Obesity, unspecified: Secondary | ICD-10-CM

## 2021-06-06 DIAGNOSIS — R600 Localized edema: Secondary | ICD-10-CM

## 2021-06-06 DIAGNOSIS — E782 Mixed hyperlipidemia: Secondary | ICD-10-CM

## 2021-06-06 LAB — LIPID PANEL
Cholesterol: 111 mg/dL (ref 0–200)
HDL: 33.7 mg/dL — ABNORMAL LOW (ref >39.00)
LDL Cholesterol: 58 mg/dL (ref 0–99)
NonHDL: 77.58
Total CHOL/HDL Ratio: 3
Triglycerides: 99 mg/dL (ref 0.0–149.0)
VLDL: 19.8 mg/dL (ref 0.0–40.0)

## 2021-06-06 LAB — CBC
HCT: 36.9 % (ref 36.0–46.0)
Hemoglobin: 12.4 g/dL (ref 12.0–15.0)
MCHC: 33.5 g/dL (ref 30.0–36.0)
MCV: 86.3 fl (ref 78.0–100.0)
Platelets: 303 10*3/uL (ref 150.0–400.0)
RBC: 4.28 Mil/uL (ref 3.87–5.11)
RDW: 13.9 % (ref 11.5–15.5)
WBC: 6.3 10*3/uL (ref 4.0–10.5)

## 2021-06-06 LAB — COMPREHENSIVE METABOLIC PANEL
ALT: 9 U/L (ref 0–35)
AST: 10 U/L (ref 0–37)
Albumin: 4.1 g/dL (ref 3.5–5.2)
Alkaline Phosphatase: 77 U/L (ref 39–117)
BUN: 8 mg/dL (ref 6–23)
CO2: 28 mEq/L (ref 19–32)
Calcium: 9.1 mg/dL (ref 8.4–10.5)
Chloride: 101 mEq/L (ref 96–112)
Creatinine, Ser: 0.63 mg/dL (ref 0.40–1.20)
GFR: 101.53 mL/min (ref >60.00)
Glucose, Bld: 84 mg/dL (ref 70–99)
Potassium: 3.5 mEq/L (ref 3.5–5.1)
Sodium: 139 mEq/L (ref 135–145)
Total Bilirubin: 0.5 mg/dL (ref 0.2–1.2)
Total Protein: 6.9 g/dL (ref 6.0–8.3)

## 2021-06-06 LAB — TSH: TSH: 1.32 u[IU]/mL (ref 0.35–5.50)

## 2021-06-06 LAB — VITAMIN D 25 HYDROXY (VIT D DEFICIENCY, FRACTURES): VITD: 78.96 ng/mL (ref 30.00–100.00)

## 2021-06-06 LAB — MAGNESIUM: Magnesium: 1.8 mg/dL (ref 1.5–2.5)

## 2021-06-06 LAB — HEMOGLOBIN A1C: Hgb A1c MFr Bld: 5.5 % (ref 4.6–6.5)

## 2021-06-06 MED ORDER — FUROSEMIDE 20 MG PO TABS
20.0000 mg | ORAL_TABLET | Freq: Every day | ORAL | 3 refills | Status: DC | PRN
  Filled 2021-06-06: qty 30, 30d supply, fill #0

## 2021-06-06 MED ORDER — MONTELUKAST SODIUM 10 MG PO TABS
10.0000 mg | ORAL_TABLET | Freq: Every evening | ORAL | 1 refills | Status: DC | PRN
  Filled 2021-06-06: qty 90, 90d supply, fill #0
  Filled 2022-01-11: qty 90, 90d supply, fill #1

## 2021-06-06 MED ORDER — LOSARTAN POTASSIUM 100 MG PO TABS
100.0000 mg | ORAL_TABLET | Freq: Every day | ORAL | 1 refills | Status: DC
  Filled 2021-06-06: qty 90, 90d supply, fill #0
  Filled 2021-09-01: qty 90, 90d supply, fill #1

## 2021-06-06 NOTE — Assessment & Plan Note (Signed)
hgba1c acceptable, minimize simple carbs. Increase exercise as tolerated. Continue current meds 

## 2021-06-06 NOTE — Assessment & Plan Note (Signed)
Stable and infrequent. Cardiology evaluation was reassuring. No associated symptoms no changes

## 2021-06-06 NOTE — Assessment & Plan Note (Signed)
Is working with [facility] now. Ozempic and Wellbutrin are diminishing appetite and cravings and she is loosing weight

## 2021-06-06 NOTE — Assessment & Plan Note (Signed)
Well controlled, no changes to meds. Encouraged heart healthy diet such as the DASH diet and exercise as tolerated.  °

## 2021-06-06 NOTE — Assessment & Plan Note (Signed)
Tolerating statin, encouraged heart healthy diet, avoid trans fats, minimize simple carbs and saturated fats. Increase exercise as tolerated 

## 2021-06-06 NOTE — Assessment & Plan Note (Signed)
Supplement and monitor 

## 2021-06-06 NOTE — Assessment & Plan Note (Signed)
Worse after a long day at school, minimize sodium elevate use compression hose and refill given on Furosemide prn

## 2021-06-06 NOTE — Assessment & Plan Note (Signed)
Following with [facility]. Is noting appetite suppression with Ozempic and/or Wellbutrin

## 2021-06-06 NOTE — Patient Instructions (Addendum)
-Encouraged increased hydration and fiber in diet. Daily probiotics. If bowels not moving can use MOM 2 tbls po in 4 oz of warm prune juice by mouth every 2-3 days. If no results then repeat in 4 hours with  Dulcolax suppository pr, may repeat again in 4 more hours as needed. Seek care if symptoms worsen. Consider daily Miralax and/or Dulcolax if symptoms persist.  Add Benefiber twice daily   -Paxlovid and/or Molnupiravir is the new COVID medication we can give you if you get COVID so make sure you test if you have symptoms because we have to treat by day 5 of symptoms for it to be effective. If you are positive let us know so we can treat. If a home test is negative and your symptoms are persistent get a PCR test. Can check testing locations at [email or web address] If you are positive we will make an appointment with [name] and we will send in Paxlovid/Molnupiravir if you would like it. Check with your pharmacy before we meet to confirm they have it in stock, if they do not then we can get the prescription at the [facility]    Hypertension, Adult High blood pressure (hypertension) is when the force of blood pumping through the arteries is too strong. The arteries are the blood vessels that carry blood from the heart throughout the body. Hypertension forces the heart to work harder to pump blood and may cause arteries to become narrow or stiff. Untreated or uncontrolled hypertension can cause a heart attack, heart failure, a stroke, kidney disease, and other problems. A blood pressure reading consists of a higher number over a lower number. Ideally, your blood pressure should be below 120/80. The first ("top") number is called the systolic pressure. It is a measure of the pressure in your arteries as your heart beats. The second ("bottom") number is called the diastolic pressure. It is a measure of the pressure in your arteries as the heart relaxes. What are the causes? The exact cause of this  condition is not known. There are some conditions that result in or are related to high blood pressure. What increases the risk? Some risk factors for high blood pressure are under your control. The following factors may make you more likely to develop this condition: Smoking. Having type 2 diabetes mellitus, high cholesterol, or both. Not getting enough exercise or physical activity. Being overweight. Having too much fat, sugar, calories, or salt (sodium) in your diet. Drinking too much alcohol. Some risk factors for high blood pressure may be difficult or impossible to change. Some of these factors include: Having chronic kidney disease. Having a family history of high blood pressure. Age. Risk increases with age. Race. You may be at higher risk if you are African American. Gender. Men are at higher risk than women before age 75. After age 43, women are at higher risk than men. Having obstructive sleep apnea. Stress. What are the signs or symptoms? High blood pressure may not cause symptoms. Very high blood pressure (hypertensive crisis) may cause: Headache. Anxiety. Shortness of breath. Nosebleed. Nausea and vomiting. Vision changes. Severe chest pain. Seizures. How is this diagnosed? This condition is diagnosed by measuring your blood pressure while you are seated, with your arm resting on a flat surface, your legs uncrossed, and your feet flat on the floor. The cuff of the blood pressure monitor will be placed directly against the skin of your upper arm at the level of your heart. It should be  measured at least twice using the same arm. Certain conditions can cause a difference in blood pressure between your right and left arms. Certain factors can cause blood pressure readings to be lower or higher than normal for a short period of time: When your blood pressure is higher when you are in a health care provider's office than when you are at home, this is called white coat  hypertension. Most people with this condition do not need medicines. When your blood pressure is higher at home than when you are in a health care provider's office, this is called masked hypertension. Most people with this condition may need medicines to control blood pressure. If you have a high blood pressure reading during one visit or you have normal blood pressure with other risk factors, you may be asked to: Return on a different day to have your blood pressure checked again. Monitor your blood pressure at home for 1 week or longer. If you are diagnosed with hypertension, you may have other blood or imaging tests to help your health care provider understand your overall risk for other conditions. How is this treated? This condition is treated by making healthy lifestyle changes, such as eating healthy foods, exercising more, and reducing your alcohol intake. Your health care provider may prescribe medicine if lifestyle changes are not enough to get your blood pressure under control, and if: Your systolic blood pressure is above 130. Your diastolic blood pressure is above 80. Your personal target blood pressure may vary depending on your medical conditions, your age, and other factors. Follow these instructions at home: Eating and drinking  Eat a diet that is high in fiber and potassium, and low in sodium, added sugar, and fat. An example eating plan is called the DASH (Dietary Approaches to Stop Hypertension) diet. To eat this way: Eat plenty of fresh fruits and vegetables. Try to fill one half of your plate at each meal with fruits and vegetables. Eat whole grains, such as whole-wheat pasta, brown rice, or whole-grain bread. Fill about one fourth of your plate with whole grains. Eat or drink low-fat dairy products, such as skim milk or low-fat yogurt. Avoid fatty cuts of meat, processed or cured meats, and poultry with skin. Fill about one fourth of your plate with lean proteins, such as  fish, chicken without skin, beans, eggs, or tofu. Avoid pre-made and processed foods. These tend to be higher in sodium, added sugar, and fat. Reduce your daily sodium intake. Most people with hypertension should eat less than 1,500 mg of sodium a day. Do not drink alcohol if: Your health care provider tells you not to drink. You are pregnant, may be pregnant, or are planning to become pregnant. If you drink alcohol: Limit how much you use to: 0-1 drink a day for women. 0-2 drinks a day for men. Be aware of how much alcohol is in your drink. In the [place] one drink equals one 12 oz bottle of beer (355 mL), one 5 oz glass of wine (148 mL), or one 1 oz glass of hard liquor (44 mL). Lifestyle  Work with your health care provider to maintain a healthy body weight or to lose weight. Ask what an ideal weight is for you. Get at least 30 minutes of exercise most days of the week. Activities may include walking, swimming, or biking. Include exercise to strengthen your muscles (resistance exercise), such as Pilates or lifting weights, as part of your weekly exercise routine. Try to do these  types of exercises for 30 minutes at least 3 days a week. Do not use any products that contain nicotine or tobacco, such as cigarettes, e-cigarettes, and chewing tobacco. If you need help quitting, ask your health care provider. Monitor your blood pressure at home as told by your health care provider. Keep all follow-up visits as told by your health care provider. This is important. Medicines Take over-the-counter and prescription medicines only as told by your health care provider. Follow directions carefully. Blood pressure medicines must be taken as prescribed. Do not skip doses of blood pressure medicine. Doing this puts you at risk for problems and can make the medicine less effective. Ask your health care provider about side effects or reactions to medicines that you should watch for. Contact a health care  provider if you: Think you are having a reaction to a medicine you are taking. Have headaches that keep coming back (recurring). Feel dizzy. Have swelling in your ankles. Have trouble with your vision. Get help right away if you: Develop a severe headache or confusion. Have unusual weakness or numbness. Feel faint. Have severe pain in your chest or abdomen. Vomit repeatedly. Have trouble breathing. Summary Hypertension is when the force of blood pumping through your arteries is too strong. If this condition is not controlled, it may put you at risk for serious complications. Your personal target blood pressure may vary depending on your medical conditions, your age, and other factors. For most people, a normal blood pressure is less than 120/80. Hypertension is treated with lifestyle changes, medicines, or a combination of both. Lifestyle changes include losing weight, eating a healthy, low-sodium diet, exercising more, and limiting alcohol. This information is not intended to replace advice given to you by your health care provider. Make sure you discuss any questions you have with your health care provider. Document Revised: 04/30/2018 Document Reviewed: 04/30/2018 Elsevier Patient Education  [place].

## 2021-06-06 NOTE — Progress Notes (Signed)
Patient ID: Brandi Lester, female    DOB: June 05, 1966  Age: 55 y.o. MRN: [id number]    Subjective:   Chief Complaint  Patient presents with   Follow-up   Subjective   HPI Brandi Lester presents for office visit today for follow up on htn and type 2 diabetes. She has no febrile illnesses or recent ER visits to report. She is taking ozempic and Wellbutrin for weight loss/depression and notes successful appetite suppression. She experienced side effects only when she had to increase dosage but they were brief and leveled out. Denies CP/SOB/HA/congestion/fevers or GU c/o. Taking meds as prescribed. It is hard to keep up with exercise due to school and work. Still has palpitations intermittent. She is experiencing constipation.  Review of Systems  Constitutional:  Negative for chills, fatigue and fever.  HENT:  Negative for congestion, rhinorrhea, sinus pressure, sinus pain and sore throat.   Eyes:  Negative for pain.  Respiratory:  Negative for cough and shortness of breath.   Cardiovascular:  Negative for chest pain, palpitations and leg swelling.  Gastrointestinal:  Positive for constipation. Negative for abdominal pain, blood in stool, diarrhea, nausea and vomiting.  Genitourinary:  Negative for decreased urine volume, flank pain, frequency, vaginal bleeding and vaginal discharge.  Musculoskeletal:  Negative for back pain.  Neurological:  Negative for headaches.   History Past Medical History:  Diagnosis Date   ADD (attention deficit disorder)    Allergy    allergic rhinitis   Anemia 07/25/2013   Angio-edema    Anxiety    Arthritis    not dx'd   Back pain    Costochondritis 02/20/2015   Diabetes mellitus    Type II   previously 3 years ago - no longer on meds    Dyslipidemia 07/25/2013   Fungus infection 10/13   Left great toe   GERD (gastroesophageal reflux disease)    Hypertension    IBS (irritable bowel syndrome)    Ingrown nail 10/13   right foot next to  the last toe   Insomnia 04/20/2013   Joint pain    Lactose intolerance    Leg edema    Obesity    Pedal edema 02/20/2015   Prediabetes    Preventative health care 09/25/2015   PVC (premature ventricular contraction)     She has a past surgical history that includes lasik; Wisdom tooth extraction; and Hysteroscopy (N/A, 10/04/2015).   Her family history includes Cancer in her father; Heart attack (age of onset: 53) in her sister; Heart attack (age of onset: 45) in her mother; Heart disease in her mother; Hypertension in her mother and another family member; Obesity in her mother; Prostate cancer in her father.She reports that she quit smoking about 19 years ago. Her smoking use included cigarettes. She has a 15.00 pack-year smoking history. She has never used smokeless tobacco. She reports current alcohol use. She reports that she does not use drugs.  Current Outpatient Medications on File Prior to Visit  Medication Sig Dispense Refill   acetaminophen (TYLENOL 8 HOUR) 650 MG CR tablet Take 1 tablet (650 mg total) every 8 (eight) hours as needed by mouth for pain.     amLODipine (NORVASC) 10 MG tablet Take 1 tablet (10 mg total) by mouth daily. 90 tablet 1   atorvastatin (LIPITOR) 10 MG tablet TAKE 1 TABLET BY MOUTH ONCE DAILY 90 tablet 0   azelastine (ASTELIN) 0.1 % nasal spray Place 2 sprays into both nostrils 2 (two) times  daily. Use in each nostril as directed 30 mL 2   buPROPion (WELLBUTRIN SR) 150 MG 12 hr tablet TAKE 1 TABLET BY MOUTH EVERY MORNING 90 tablet 0   carvedilol (COREG) 25 MG tablet Take 1 tablet (25 mg total) by mouth 2 (two) times daily. 180 tablet 3   cholecalciferol (VITAMIN D3) 25 MCG (1000 UT) tablet Take 4,000 Units by mouth daily.     COVID-19 mRNA Vac-TriS, Pfizer, SUSP injection Inject into the muscle. 0.3 mL 0   EPINEPHrine (EPIPEN 2-PAK) 0.3 mg/0.3 mL IJ SOAJ injection Use as directed for severe allergic reaction 2 each 1   fluticasone (FLONASE) 50 MCG/ACT nasal  spray Place 2 sprays into both nostrils daily as needed for allergies. 16 g 2   hydrochlorothiazide (HYDRODIURIL) 25 MG tablet Take 1 tablet (25 mg total) by mouth daily. 90 tablet 1   ibuprofen (ADVIL,MOTRIN) 600 MG tablet Take 1 tablet (600 mg total) by mouth every 6 (six) hours as needed. 90 tablet 0   losartan (COZAAR) 100 MG tablet Take 1 tablet (100 mg total) by mouth daily. 90 tablet 1   medroxyPROGESTERone (PROVERA) 10 MG tablet TAKE 1 TABLET (10 MG TOTAL) BY MOUTH DAILY. 90 tablet 3   metFORMIN (GLUCOPHAGE) 500 MG tablet TAKE 1 TABLET BY MOUTH TWICE DAILY WITH A MEAL 180 tablet 0   montelukast (SINGULAIR) 10 MG tablet Take 1 tablet (10 mg total) by mouth at bedtime as needed. 90 tablet 1   Omega-3 Fatty Acids (FISH OIL PO) Take 1 capsule by mouth daily.     ondansetron (ZOFRAN-ODT) 8 MG disintegrating tablet DISSOLVE 1 TABLET (8 MG TOTAL) BY MOUTH EVERY 8 (EIGHT) HOURS AS NEEDED FOR NAUSEA OR VOMITING. 20 tablet 0   pantoprazole (PROTONIX) 40 MG tablet TAKE 1 TABLET BY MOUTH ONCE DAILY 90 tablet 1   potassium chloride SA (KLOR-CON) 20 MEQ tablet TAKE 1 TABLET (20 MEQ TOTAL) BY MOUTH 3 (THREE) TIMES DAILY. 270 tablet 1   Probiotic Product (PROBIOTIC-10 PO) Take by mouth as needed.      Semaglutide, 1 MG/DOSE, 4 MG/3ML SOPN Inject 1 mg as directed once a week. 3 mL 0   TURMERIC PO Take 1 capsule by mouth daily.     No current facility-administered medications on file prior to visit.     Objective:  Objective  Physical Exam Constitutional:      General: She is not in acute distress.    Appearance: Normal appearance. She is not ill-appearing or toxic-appearing.  HENT:     Head: Normocephalic and atraumatic.     Right Ear: Tympanic membrane, ear canal and external ear normal.     Left Ear: Tympanic membrane, ear canal and external ear normal.     Nose: No congestion or rhinorrhea.  Eyes:     Extraocular Movements: Extraocular movements intact.     Pupils: Pupils are equal, round,  and reactive to light.  Cardiovascular:     Rate and Rhythm: Normal rate and regular rhythm.     Pulses: Normal pulses.     Heart sounds: Normal heart sounds. No murmur heard. Pulmonary:     Effort: Pulmonary effort is normal. No respiratory distress.     Breath sounds: Normal breath sounds. No wheezing, rhonchi or rales.  Abdominal:     General: Bowel sounds are normal.     Palpations: Abdomen is soft. There is no mass.     Tenderness: There is no abdominal tenderness. There is no guarding.  Hernia: No hernia is present.  Musculoskeletal:        General: Normal range of motion.     Cervical back: Normal range of motion and neck supple.  Skin:    General: Skin is warm and dry.  Neurological:     Mental Status: She is alert and oriented to person, place, and time.  Psychiatric:        Behavior: Behavior normal.   BP 124/78   Pulse 100   Temp 99.1 F (37.3 C)   Resp 16   Wt 292 lb 9.6 oz (132.7 kg)   SpO2 99%   BMI 41.98 kg/m  Wt Readings from Last 3 Encounters:  06/06/21 292 lb 9.6 oz (132.7 kg)  05/11/21 298 lb (135.2 kg)  04/13/21 291 lb (132 kg)     Lab Results  Component Value Date   WBC 7.0 11/28/2020   HGB 13.5 11/28/2020   HCT 40.9 11/28/2020   PLT 326 11/28/2020   GLUCOSE 84 11/28/2020   CHOL 119 11/28/2020   TRIG 108 11/28/2020   HDL 36 (L) 11/28/2020   LDLCALC 64 11/28/2020   ALT 12 11/28/2020   AST 11 11/28/2020   NA 139 11/28/2020   K 3.6 11/28/2020   CL 100 11/28/2020   CREATININE 0.73 11/28/2020   BUN 9 11/28/2020   CO2 26 11/28/2020   TSH 2.19 11/28/2020   HGBA1C 5.6 11/28/2020   MICROALBUR 0.50 12/15/2012    ECHOCARDIOGRAM COMPLETE  Result Date: 11/14/2020    ECHOCARDIOGRAM REPORT   Patient Name:   Lennox Pippins Murfin Date of Exam: 11/14/2020 Medical Rec #:  [id number]            Height:       70.0 in Accession #:    [id number]           Weight:       308.0 lb Date of Birth:  1965-11-03            BSA:          2.508 m Patient Age:     22 years             BP:           140/86 mmHg Patient Gender: F                    HR:           83 bpm. Exam Location:  [facility] Procedure: 2D Echo, Cardiac Doppler and Color Doppler Indications:    R00.2 Palpitations  History:        Patient has no prior history of Echocardiogram examinations.                 Arrythmias:PVC, Signs/Symptoms:Chest Pain; Risk                 Factors:Hypertension, Diabetes, Dyslipidemia and Family History                 of Coronary Artery Disease.  Sonographer:    Geradine Girt Referring Phys: [id number] [place] TOBB  Sonographer Comments: Technically difficult study due to poor echo windows and patient is morbidly obese. Image acquisition challenging due to patient body habitus. IMPRESSIONS  1. Left ventricular ejection fraction, by estimation, is 60 to 65%. The left ventricle has normal function. The left ventricle has no regional wall motion abnormalities. Left ventricular diastolic parameters are consistent with Grade I diastolic dysfunction (impaired relaxation).  2. Right ventricular  systolic function is normal. The right ventricular size is normal.  3. The mitral valve is normal in structure. No evidence of mitral valve regurgitation. No evidence of mitral stenosis.  4. The aortic valve is normal in structure. Aortic valve regurgitation is not visualized. No aortic stenosis is present.  5. The inferior vena cava is normal in size with greater than 50% respiratory variability, suggesting right atrial pressure of 3 mmHg. FINDINGS  Left Ventricle: Left ventricular ejection fraction, by estimation, is 60 to 65%. The left ventricle has normal function. The left ventricle has no regional wall motion abnormalities. The left ventricular internal cavity size was normal in size. There is  no left ventricular hypertrophy. Left ventricular diastolic parameters are consistent with Grade I diastolic dysfunction (impaired relaxation). Right Ventricle: The right ventricular size is normal. No  increase in right ventricular wall thickness. Right ventricular systolic function is normal. Left Atrium: Left atrial size was normal in size. Right Atrium: Right atrial size was normal in size. Pericardium: There is no evidence of pericardial effusion. Mitral Valve: The mitral valve is normal in structure. No evidence of mitral valve regurgitation. No evidence of mitral valve stenosis. Tricuspid Valve: The tricuspid valve is normal in structure. Tricuspid valve regurgitation is not demonstrated. No evidence of tricuspid stenosis. Aortic Valve: The aortic valve is normal in structure. Aortic valve regurgitation is not visualized. No aortic stenosis is present. Pulmonic Valve: The pulmonic valve was normal in structure. Pulmonic valve regurgitation is not visualized. No evidence of pulmonic stenosis. Aorta: The aortic root is normal in size and structure. Venous: The inferior vena cava is normal in size with greater than 50% respiratory variability, suggesting right atrial pressure of 3 mmHg. IAS/Shunts: No atrial level shunt detected by color flow Doppler.  LEFT VENTRICLE PLAX 2D LVIDd:         4.20 cm LVIDs:         2.65 cm LV PW:         0.82 cm LV IVS:        1.44 cm LVOT diam:     2.20 cm LV SV:         54 LV SV Index:   22 LVOT Area:     3.80 cm  LEFT ATRIUM           Index       RIGHT ATRIUM           Index LA Vol (A2C): 31.6 ml 12.60 ml/m RA Area:     13.50 cm LA Vol (A4C): 45.3 ml 18.06 ml/m RA Volume:   30.30 ml  12.08 ml/m  AORTIC VALVE LVOT Vmax:   79.00 cm/s LVOT Vmean:  49.400 cm/s LVOT VTI:    0.143 m  AORTA Ao Root diam: 2.60 cm Ao Asc diam:  3.30 cm MITRAL VALVE MV Area (PHT): 2.26 cm    SHUNTS MV Decel Time: 335 msec    Systemic VTI:  0.14 m MV E velocity: 76.70 cm/s  Systemic Diam: 2.20 cm MV A velocity: 88.70 cm/s MV E/A ratio:  0.86 Sunny Schlein Revankar MD Electronically signed by Jyl Heinz MD Signature Date/Time: 11/14/2020/5:28:35 PM    Final      Assessment & Plan:  Plan    Meds  ordered this encounter  Medications   furosemide (LASIX) 20 MG tablet    Sig: Take 1 tablet (20 mg total) by mouth daily as needed for edema (weight gain of >3 lbs/24 hours).    Dispense:  30 tablet    Refill:  3    Problem List Items Addressed This Visit     Essential hypertension    Well controlled, no changes to meds. Encouraged heart healthy diet such as the DASH diet and exercise as tolerated.       Relevant Medications   furosemide (LASIX) 20 MG tablet   Other Relevant Orders   CBC   Comprehensive metabolic panel   TSH   Diabetes mellitus type 2 in obese (HCC)    hgba1c acceptable, minimize simple carbs. Increase exercise as tolerated. Continue current meds      Relevant Orders   Hemoglobin A1c   Insulin, random   Hyperlipidemia associated with type 2 diabetes mellitus ([place])    Tolerating statin, encouraged heart healthy diet, avoid trans fats, minimize simple carbs and saturated fats. Increase exercise as tolerated      Relevant Medications   furosemide (LASIX) 20 MG tablet   Pedal edema    Worse after a long day at school, minimize sodium elevate use compression hose and refill given on Furosemide prn      Vitamin D deficiency - Primary    Supplement and monitor      Relevant Orders   VITAMIN D 25 Hydroxy (Vit-D Deficiency, Fractures)   Hypertriglyceridemia   Relevant Medications   furosemide (LASIX) 20 MG tablet   Other Relevant Orders   Lipid panel   Palpitations    Stable and infrequent. Cardiology evaluation was reassuring. No associated symptoms no changes      Relevant Orders   Magnesium   Morbid obesity ([place])    Is working with [facility] now. Ozempic and Wellbutrin are diminishing appetite and cravings and she is loosing weight      Mixed hyperlipidemia    Tolerating statin, encouraged heart healthy diet, avoid trans fats, minimize simple carbs and saturated fats. Increase exercise as tolerated      Relevant Medications   furosemide (LASIX) 20  MG tablet   Hypertension    Well controlled, no changes to meds. Encouraged heart healthy diet such as the DASH diet and exercise as tolerated.       Relevant Medications   furosemide (LASIX) 20 MG tablet   RESOLVED: Obesity    Following with [facility]. Is noting appetite suppression with Ozempic and/or Wellbutrin      Other Visit Diagnoses     Need for influenza vaccination       Relevant Orders   Flu Vaccine QUAD 36+ mos IM (Fluarix, Fluzone & Afluria Quad PF (Completed)       Follow-up: Return in about 6 months (around 12/05/2021) for f/u, annual exam.  I, Suezanne Jacquet, acting as a scribe for Penni Homans, MD, have documented all relevent documentation on behalf of Penni Homans, MD, as directed by Penni Homans, MD while in the presence of Penni Homans, MD. DO:06/06/21. I, Mosie Lukes, MD personally performed the services described in this documentation. All medical record entries made by the scribe were at my direction and in my presence. I have reviewed the chart and agree that the record reflects my personal performance and is accurate and complete

## 2021-06-06 NOTE — Progress Notes (Signed)
   Covid-19 Vaccination Clinic  Name:  Brandi Lester    MRN: [id number] DOB: 1966/03/10  06/06/2021  [name] was observed post Covid-19 immunization for 15 minutes without incident. She was provided with Vaccine Information Sheet and instruction to access the V-Safe system.   [name] was instructed to call 911 with any severe reactions post vaccine: Difficulty breathing  Swelling of face and throat  A fast heartbeat  A bad rash all over body  Dizziness and weakness

## 2021-06-07 ENCOUNTER — Other Ambulatory Visit: Payer: Self-pay

## 2021-06-07 ENCOUNTER — Ambulatory Visit (INDEPENDENT_AMBULATORY_CARE_PROVIDER_SITE_OTHER): Payer: BC Managed Care – PPO | Admitting: Family Medicine

## 2021-06-07 ENCOUNTER — Encounter (INDEPENDENT_AMBULATORY_CARE_PROVIDER_SITE_OTHER): Payer: Self-pay | Admitting: Family Medicine

## 2021-06-07 ENCOUNTER — Other Ambulatory Visit (HOSPITAL_BASED_OUTPATIENT_CLINIC_OR_DEPARTMENT_OTHER): Payer: Self-pay

## 2021-06-07 VITALS — BP 112/74 | HR 87 | Temp 99.1°F | Ht 70.0 in | Wt 289.0 lb

## 2021-06-07 DIAGNOSIS — Z6841 Body Mass Index (BMI) 40.0 and over, adult: Secondary | ICD-10-CM | POA: Diagnosis not present

## 2021-06-07 DIAGNOSIS — E1169 Type 2 diabetes mellitus with other specified complication: Secondary | ICD-10-CM | POA: Diagnosis not present

## 2021-06-07 DIAGNOSIS — F3289 Other specified depressive episodes: Secondary | ICD-10-CM

## 2021-06-07 DIAGNOSIS — E66813 Obesity, class 3: Secondary | ICD-10-CM | POA: Insufficient documentation

## 2021-06-07 DIAGNOSIS — Z9189 Other specified personal risk factors, not elsewhere classified: Secondary | ICD-10-CM

## 2021-06-07 LAB — INSULIN, RANDOM: Insulin: 11.2 u[IU]/mL

## 2021-06-07 MED ORDER — ONDANSETRON 8 MG PO TBDP
ORAL_TABLET | ORAL | 1 refills | Status: DC
  Filled 2021-06-07: qty 18, 6d supply, fill #0

## 2021-06-07 MED ORDER — TIRZEPATIDE 7.5 MG/0.5ML ~~LOC~~ SOAJ
7.5000 mg | SUBCUTANEOUS | 0 refills | Status: DC
  Filled 2021-06-07: qty 2, 28d supply, fill #0

## 2021-06-07 NOTE — Progress Notes (Signed)
Chief Complaint:   OBESITY Brandi Lester is here to discuss her progress with her obesity treatment plan along with follow-up of her obesity related diagnoses. Brandi Lester is on the Category 4 Plan and keeping a food journal and adhering to recommended goals of 1600-1800 calories and 125 grams of protein and states she is following her eating plan approximately 75% of the time. Brandi Lester states she is using a stationary bike for 30 minutes 3 times per week and walking for 45 minutes 4 times per week.  Today's visit was #: 82 Starting weight: 325 lbs Starting date: 05/23/2017 Today's weight: 289 lbs Today's date: 06/07/2021 Total lbs lost to date: 36 lbs Total lbs lost since last in-office visit: 9 lbs  Interim History: Brandi Lester is journaling regularly and averaging 90 grams of protein daily. Her calories are at goal - between 1600-1800. She is doing great with exercise. 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   Subjective:   1. Type 2 diabetes mellitus with other specified complication, without long-term current use of insulin (HCC) Brandi Lester's diabetes mellitus is well controlled. Her A1C was 5.5. She denies hypoglycemia. She is on Metformin 500 mg and Ozempic 1 mg. She does have nausea with the Ozempic and takes Zofran.  Lab Results  Component Value Date   HGBA1C 5.5 06/06/2021   HGBA1C 5.6 11/28/2020   HGBA1C 6.1 03/21/2020   Lab Results  Component Value Date   MICROALBUR 0.50 12/15/2012   LDLCALC 58 06/06/2021   CREATININE 0.63 06/06/2021   Lab Results  Component Value Date   INSULIN 20.7 12/01/2019   INSULIN 12.9 07/07/2019   INSULIN 21.2 11/20/2018   INSULIN 15.4 07/14/2018   INSULIN 12.9 09/12/2017    2. Other depression, with emotional eating Brandi Lester's cravings are well controlled. She notes some anxiety. Her primary care provider suggested possibly adding another medications and she may consider.  3. At risk for side effect of medication Brandi Lester is at risk for side effect  of medication due to start of [name].  Assessment/Plan:   1. Type 2 diabetes mellitus with other specified complication, without long-term current use of insulin (HCC) Brandi Lester agrees to start [name] 7.5 mg weekly. She will discontinue Ozempic.  She will continue Metformin.  We will refill Zofran 8 mg today.  - tirzepatide ([name]) 7.5 MG/0.5ML Pen; Inject 7.5 mg into the skin once a week.  Dispense: 6 mL; Refill: 0  - ondansetron (ZOFRAN-ODT) 8 MG disintegrating tablet; DISSOLVE 1 TABLET (8 MG TOTAL) BY MOUTH EVERY 8 (EIGHT) HOURS AS NEEDED FOR NAUSEA OR VOMITING.  Dispense: 20 tablet; Refill: 1  2. Other depression, with emotional eating Behavior modification techniques were discussed today to help Brandi Lester deal with her emotional/non-hunger eating behaviors.  Brandi Lester will continue bupropion. Orders and follow up as documented in patient record.    3. At risk for side effect of medication Brandi Lester was given approximately 15 minutes of drug side effect counseling today.  We discussed side effect possibility and risk versus benefits. Brandi Lester agreed to the medication and will contact this office if these side effects are intolerable.  Repetitive spaced learning was employed today to elicit superior memory formation and behavioral change.   4. Obesity: BMI 41.47 Brandi Lester is currently in the action stage of change. As such, her goal is to continue with weight loss efforts. She has agreed to the Category 4 Plan and keeping a food journal and adhering to recommended goals of 1600-1800 calories and 90 grams protein daily.  Exercise  goals:  As is.  Behavioral modification strategies: decreasing simple carbohydrates and planning for success.  [name] has agreed to follow-up with our clinic in 5 weeks.  Objective:   Blood pressure 112/74, pulse 87, temperature 99.1 F (37.3 C), height 5\' 10"  (1.778 m), weight 289 lb (131.1 kg), SpO2 96 %. Body mass index is 41.47 kg/m.  General:  Cooperative, alert, well developed, in no acute distress. HEENT: Conjunctivae and lids unremarkable. Cardiovascular: Regular rhythm.  Lungs: Normal work of breathing. Neurologic: No focal deficits.   Lab Results  Component Value Date   CREATININE 0.63 06/06/2021   BUN 8 06/06/2021   NA 139 06/06/2021   K 3.5 06/06/2021   CL 101 06/06/2021   CO2 28 06/06/2021   Lab Results  Component Value Date   ALT 9 06/06/2021   AST 10 06/06/2021   ALKPHOS 77 06/06/2021   BILITOT 0.5 06/06/2021   Lab Results  Component Value Date   HGBA1C 5.5 06/06/2021   HGBA1C 5.6 11/28/2020   HGBA1C 6.1 03/21/2020   HGBA1C 5.9 (H) 12/01/2019   HGBA1C 5.9 (H) 07/07/2019   Lab Results  Component Value Date   INSULIN 20.7 12/01/2019   INSULIN 12.9 07/07/2019   INSULIN 21.2 11/20/2018   INSULIN 15.4 07/14/2018   INSULIN 12.9 09/12/2017   Lab Results  Component Value Date   TSH 1.32 06/06/2021   Lab Results  Component Value Date   CHOL 111 06/06/2021   HDL 33.70 (L) 06/06/2021   LDLCALC 58 06/06/2021   TRIG 99.0 06/06/2021   CHOLHDL 3 06/06/2021   Lab Results  Component Value Date   VD25OH 78.96 06/06/2021   VD25OH 78 11/28/2020   VD25OH 56.44 03/21/2020   Lab Results  Component Value Date   WBC 6.3 06/06/2021   HGB 12.4 06/06/2021   HCT 36.9 06/06/2021   MCV 86.3 06/06/2021   PLT 303.0 06/06/2021   No results found for: IRON, TIBC, FERRITIN  Attestation Statements:   Reviewed by clinician on day of visit: allergies, medications, problem list, medical history, surgical history, family history, social history, and previous encounter notes.  I, Lizbeth Bark, RMA, am acting as Location manager for [employer], [place].   I have reviewed the above documentation for accuracy and completeness, and I agree with the above. -  Georgianne Fick, FNP

## 2021-06-08 ENCOUNTER — Other Ambulatory Visit (HOSPITAL_BASED_OUTPATIENT_CLINIC_OR_DEPARTMENT_OTHER): Payer: Self-pay

## 2021-06-08 ENCOUNTER — Encounter (INDEPENDENT_AMBULATORY_CARE_PROVIDER_SITE_OTHER): Payer: Self-pay

## 2021-06-16 ENCOUNTER — Other Ambulatory Visit (HOSPITAL_BASED_OUTPATIENT_CLINIC_OR_DEPARTMENT_OTHER): Payer: Self-pay

## 2021-06-16 MED ORDER — MODERNA COVID-19 BIVAL BOOSTER 50 MCG/0.5ML IM SUSP
INTRAMUSCULAR | 0 refills | Status: DC
  Filled 2021-06-16: qty 0.5, 1d supply, fill #0

## 2021-06-27 ENCOUNTER — Encounter: Payer: Self-pay | Admitting: Family Medicine

## 2021-07-02 ENCOUNTER — Encounter (INDEPENDENT_AMBULATORY_CARE_PROVIDER_SITE_OTHER): Payer: Self-pay | Admitting: Family Medicine

## 2021-07-02 DIAGNOSIS — E1169 Type 2 diabetes mellitus with other specified complication: Secondary | ICD-10-CM

## 2021-07-03 ENCOUNTER — Other Ambulatory Visit (HOSPITAL_BASED_OUTPATIENT_CLINIC_OR_DEPARTMENT_OTHER): Payer: Self-pay

## 2021-07-03 ENCOUNTER — Other Ambulatory Visit: Payer: Self-pay | Admitting: Family Medicine

## 2021-07-03 DIAGNOSIS — I10 Essential (primary) hypertension: Secondary | ICD-10-CM

## 2021-07-03 DIAGNOSIS — E876 Hypokalemia: Secondary | ICD-10-CM

## 2021-07-03 DIAGNOSIS — T7840XA Allergy, unspecified, initial encounter: Secondary | ICD-10-CM

## 2021-07-03 MED ORDER — POTASSIUM CHLORIDE CRYS ER 20 MEQ PO TBCR
EXTENDED_RELEASE_TABLET | ORAL | 1 refills | Status: DC
Start: 2021-07-03 — End: 2022-01-08
  Filled 2021-07-03: qty 270, 90d supply, fill #0
  Filled 2021-10-05: qty 270, 90d supply, fill #1

## 2021-07-03 MED ORDER — TIRZEPATIDE 10 MG/0.5ML ~~LOC~~ SOAJ
10.0000 mg | SUBCUTANEOUS | 0 refills | Status: DC
  Filled 2021-07-03: qty 2, 28d supply, fill #0

## 2021-07-03 NOTE — Telephone Encounter (Signed)
Please advise 

## 2021-07-04 ENCOUNTER — Other Ambulatory Visit (HOSPITAL_BASED_OUTPATIENT_CLINIC_OR_DEPARTMENT_OTHER): Payer: Self-pay

## 2021-07-09 ENCOUNTER — Encounter: Payer: Self-pay | Admitting: Family Medicine

## 2021-07-10 ENCOUNTER — Telehealth (INDEPENDENT_AMBULATORY_CARE_PROVIDER_SITE_OTHER): Payer: BC Managed Care – PPO | Admitting: Family Medicine

## 2021-07-10 ENCOUNTER — Other Ambulatory Visit (HOSPITAL_BASED_OUTPATIENT_CLINIC_OR_DEPARTMENT_OTHER): Payer: Self-pay

## 2021-07-10 ENCOUNTER — Encounter: Payer: Self-pay | Admitting: Family Medicine

## 2021-07-10 DIAGNOSIS — J0100 Acute maxillary sinusitis, unspecified: Secondary | ICD-10-CM

## 2021-07-10 MED ORDER — PREDNISONE 20 MG PO TABS
40.0000 mg | ORAL_TABLET | Freq: Every day | ORAL | 0 refills | Status: AC
  Filled 2021-07-10: qty 10, 5d supply, fill #0

## 2021-07-10 MED ORDER — DOXYCYCLINE HYCLATE 100 MG PO TABS
100.0000 mg | ORAL_TABLET | Freq: Two times a day (BID) | ORAL | 0 refills | Status: DC
Start: 2021-07-10 — End: 2021-10-26
  Filled 2021-07-10: qty 20, 10d supply, fill #0

## 2021-07-10 NOTE — Progress Notes (Signed)
Chief Complaint  Patient presents with   Sinusitis   Ear Fullness    Left     Brandi Lester here for URI complaints. Due to COVID-19 pandemic, we are interacting via web portal for an electronic face-to-face visit. I verified patient's ID using 2 identifiers. Patient agreed to proceed with visit via this method. Patient is at home, I am at office. Patient and I are present for visit.   Duration: 10 days  Worsening.  Associated symptoms: sinus congestion, sinus pain, rhinorrhea, ear fullness, nausea, and ear drainage Denies: itchy watery eyes, ear pain, sore throat, wheezing, shortness of breath, myalgia, and fevers, V/D Treatment to date: Nasacort, Astelin, saline spray/rinses, Zyrtec, Singulair, Aleve Sick contacts: None known Did not test for covid.   Past Medical History:  Diagnosis Date   ADD (attention deficit disorder)    Allergy    allergic rhinitis   Anemia 07/25/2013   Angio-edema    Anxiety    Arthritis    not dx'd   Back pain    Costochondritis 02/20/2015   Diabetes mellitus    Type II   previously 3 years ago - no longer on meds    Dyslipidemia 07/25/2013   Fungus infection 10/13   Left great toe   GERD (gastroesophageal reflux disease)    Hypertension    IBS (irritable bowel syndrome)    Ingrown nail 10/13   right foot next to the last toe   Insomnia 04/20/2013   Joint pain    Lactose intolerance    Leg edema    Obesity    Pedal edema 02/20/2015   Prediabetes    Preventative health care 09/25/2015   PVC (premature ventricular contraction)     Objective No conversational dyspnea Age appropriate judgment and insight Nml affect and mood  Acute maxillary sinusitis, recurrence not specified - Plan: predniSONE (DELTASONE) 20 MG tablet, doxycycline (VIBRA-TABS) 100 MG tablet  5 d pred burst, if no better after 2 d, will start doxy.  Continue to push fluids, practice good hand hygiene, cover mouth when coughing. F/u prn. If starting to experience  fevers, shaking, or shortness of breath, seek immediate care. Pt voiced understanding and agreement to the plan.  [place], DO 07/10/21 2:25 PM

## 2021-07-10 NOTE — Telephone Encounter (Signed)
Pt called regarding sinus infection. Scheduled vv with [facility] 11/7 @ 2:15.

## 2021-07-11 ENCOUNTER — Other Ambulatory Visit: Payer: Self-pay

## 2021-07-11 ENCOUNTER — Ambulatory Visit (INDEPENDENT_AMBULATORY_CARE_PROVIDER_SITE_OTHER): Payer: BC Managed Care – PPO | Admitting: Family Medicine

## 2021-07-11 ENCOUNTER — Encounter (INDEPENDENT_AMBULATORY_CARE_PROVIDER_SITE_OTHER): Payer: Self-pay | Admitting: Family Medicine

## 2021-07-11 VITALS — BP 119/74 | HR 84 | Temp 98.4°F | Ht 70.0 in | Wt 288.0 lb

## 2021-07-11 DIAGNOSIS — E1169 Type 2 diabetes mellitus with other specified complication: Secondary | ICD-10-CM | POA: Diagnosis not present

## 2021-07-11 DIAGNOSIS — Z6841 Body Mass Index (BMI) 40.0 and over, adult: Secondary | ICD-10-CM

## 2021-07-11 DIAGNOSIS — E1159 Type 2 diabetes mellitus with other circulatory complications: Secondary | ICD-10-CM

## 2021-07-11 DIAGNOSIS — I152 Hypertension secondary to endocrine disorders: Secondary | ICD-10-CM

## 2021-07-11 NOTE — Progress Notes (Signed)
Chief Complaint:   OBESITY Brandi Lester is here to discuss her progress with her obesity treatment plan along with follow-up of her obesity related diagnoses. Brandi Lester is on the Category 4 Plan and keeping a food journal and adhering to recommended goals of 1600-1800 calories and 90 grams of protein and states she is following her eating plan approximately 75% of the time. Brandi Lester states she is walking and using a stationary bike for 45 minutes 3-4 times per week.  Today's visit was #: 44 Starting weight: 325 lbs Starting date: 05/23/2017 Today's weight: 288 lbs Today's date: 07/11/2021 Total lbs lost to date: 37 lbs Total lbs lost since last in-office visit: 1 lb  Interim History: Brandi Lester feels she has been snacking more recently. She has noted some hunger on [name]. We increased the dose which so far has worked well. She is able to meet protein and calorie goals for the most part.   Subjective:   1. Type 2 diabetes mellitus with other specified complication, without long-term current use of insulin (HCC) Brandi Lester feels Brandi Lester is helping fairly well for appetite suppression. She denies nausea. She is tolerating [name] well.   Lab Results  Component Value Date   HGBA1C 5.5 06/06/2021   HGBA1C 5.6 11/28/2020   HGBA1C 6.1 03/21/2020   Lab Results  Component Value Date   MICROALBUR 0.50 12/15/2012   LDLCALC 58 06/06/2021   CREATININE 0.63 06/06/2021   Lab Results  Component Value Date   INSULIN 20.7 12/01/2019   INSULIN 12.9 07/07/2019   INSULIN 21.2 11/20/2018   INSULIN 15.4 07/14/2018   INSULIN 12.9 09/12/2017    2. Hypertension associated with type 2 diabetes mellitus ([place]) Brandi Lester's blood pressure is very well controlled. She says she has an upcoming appointment with cardiology. She is on Norvasc 10 mg, Carvedilol 25 mg twice daily, HCTZ 25 mg, and Losartan 100 mg.   BP Readings from Last 3 Encounters:  07/11/21 119/74  06/07/21 112/74  06/06/21 124/78      Assessment/Plan:   1. Type 2 diabetes mellitus with other specified complication, without long-term current use of insulin ([place]) Brandi Lester will continue [name] 10 mg. She will need a refill in 3 weeks and will send me a message to let me know about status of appetite control so we can decide on dosage.   2. Hypertension associated with type 2 diabetes mellitus ([place]) Brandi Lester will follow up with cardiology. She will continue her medications.   3. Obesity: BMI 41.32 Brandi Lester is currently in the action stage of change. As such, her goal is to continue with weight loss efforts. She has agreed to keeping a food journal and adhering to recommended goals of 1600-1800 calories and 90 grams of protein daily.  Handouts: Holiday strategies.  Exercise goals:  As is.  Behavioral modification strategies: decreasing simple carbohydrates.  Brandi Lester has agreed to follow-up with our clinic in 6 weeks ( per patient request).  Objective:   Blood pressure 119/74, pulse 84, temperature 98.4 F (36.9 C), height 5\' 10"  (1.778 m), weight 288 lb (130.6 kg), SpO2 97 %. Body mass index is 41.32 kg/m.  General: Cooperative, alert, well developed, in no acute distress. HEENT: Conjunctivae and lids unremarkable. Cardiovascular: Regular rhythm.  Lungs: Normal work of breathing. Neurologic: No focal deficits.   Lab Results  Component Value Date   CREATININE 0.63 06/06/2021   BUN 8 06/06/2021   NA 139 06/06/2021   K 3.5 06/06/2021   CL 101 06/06/2021   CO2  28 06/06/2021   Lab Results  Component Value Date   ALT 9 06/06/2021   AST 10 06/06/2021   ALKPHOS 77 06/06/2021   BILITOT 0.5 06/06/2021   Lab Results  Component Value Date   HGBA1C 5.5 06/06/2021   HGBA1C 5.6 11/28/2020   HGBA1C 6.1 03/21/2020   HGBA1C 5.9 (H) 12/01/2019   HGBA1C 5.9 (H) 07/07/2019   Lab Results  Component Value Date   INSULIN 20.7 12/01/2019   INSULIN 12.9 07/07/2019   INSULIN 21.2 11/20/2018   INSULIN 15.4  07/14/2018   INSULIN 12.9 09/12/2017   Lab Results  Component Value Date   TSH 1.32 06/06/2021   Lab Results  Component Value Date   CHOL 111 06/06/2021   HDL 33.70 (L) 06/06/2021   LDLCALC 58 06/06/2021   TRIG 99.0 06/06/2021   CHOLHDL 3 06/06/2021   Lab Results  Component Value Date   VD25OH 78.96 06/06/2021   VD25OH 78 11/28/2020   VD25OH 56.44 03/21/2020   Lab Results  Component Value Date   WBC 6.3 06/06/2021   HGB 12.4 06/06/2021   HCT 36.9 06/06/2021   MCV 86.3 06/06/2021   PLT 303.0 06/06/2021   No results found for: IRON, TIBC, FERRITIN  Attestation Statements:   Reviewed by clinician on day of visit: allergies, medications, problem list, medical history, surgical history, family history, social history, and previous encounter notes.  I, Lizbeth Bark, RMA, am acting as Location manager for [employer], [place].   I have reviewed the above documentation for accuracy and completeness, and I agree with the above. -  Georgianne Fick, FNP

## 2021-07-17 ENCOUNTER — Ambulatory Visit: Payer: BC Managed Care – PPO | Admitting: Cardiology

## 2021-07-17 ENCOUNTER — Other Ambulatory Visit: Payer: Self-pay

## 2021-07-17 VITALS — BP 142/80 | HR 97 | Ht 70.0 in | Wt 298.8 lb

## 2021-07-17 DIAGNOSIS — I10 Essential (primary) hypertension: Secondary | ICD-10-CM | POA: Diagnosis not present

## 2021-07-17 DIAGNOSIS — I493 Ventricular premature depolarization: Secondary | ICD-10-CM

## 2021-07-17 NOTE — Patient Instructions (Signed)

## 2021-07-17 NOTE — Progress Notes (Signed)
Cardiology Office Note:    Date:  07/18/2021   ID:  Brandi Lester, DOB [id number], MRN [id number]  PCP:  Mosie Lukes, MD  Cardiologist:  Berniece Salines, DO  Electrophysiologist:  None   Referring MD: Mosie Lukes, MD   " I am doing well"   History of Present Illness:    Brandi Lester is a 55 y.o. female with a hx of hypertension, hyperlipidemia, diabetes mellitus is here today for follow-up visit.  I initially on October 19, 2020 to be evaluated for chest pain and palpitations.  At that time I placed a monitor on the patient as well as order a coronary CT and echocardiogram.  I saw the patient on Jan 16, 2021 at that time we switched her blood pressure medications from metoprolol to carvedilol.  We discussed her testing results.  This is outpatient she has been following her healthy wellness program.  She notes that her blood pressure has improved significantly.  She does still feel symptoms from her PVCs.  But they are not many and not debilitating.   Past Medical History:  Diagnosis Date   ADD (attention deficit disorder)    Allergy    allergic rhinitis   Anemia 07/25/2013   Angio-edema    Anxiety    Arthritis    not dx'd   Back pain    Costochondritis 02/20/2015   Diabetes mellitus    Type II   previously 3 years ago - no longer on meds    Dyslipidemia 07/25/2013   Fungus infection 10/13   Left great toe   GERD (gastroesophageal reflux disease)    Hypertension    IBS (irritable bowel syndrome)    Ingrown nail 10/13   right foot next to the last toe   Insomnia 04/20/2013   Joint pain    Lactose intolerance    Leg edema    Obesity    Pedal edema 02/20/2015   Prediabetes    Preventative health care 09/25/2015   PVC (premature ventricular contraction)     Past Surgical History:  Procedure Laterality Date   HYSTEROSCOPY N/A 10/04/2015   Procedure: HYSTEROSCOPY with Removal IUD;  Surgeon: Vanessa Kick, MD;  Location: [facility];  Service: Gynecology;   Laterality: N/A;   lasik     eye surgery   WISDOM TOOTH EXTRACTION      Current Medications: Current Meds  Medication Sig   acetaminophen (TYLENOL 8 HOUR) 650 MG CR tablet Take 1 tablet (650 mg total) every 8 (eight) hours as needed by mouth for pain.   amLODipine (NORVASC) 10 MG tablet Take 1 tablet (10 mg total) by mouth daily.   atorvastatin (LIPITOR) 10 MG tablet TAKE 1 TABLET BY MOUTH ONCE DAILY   azelastine (ASTELIN) 0.1 % nasal spray Place 2 sprays into both nostrils 2 (two) times daily. Use in each nostril as directed   buPROPion (WELLBUTRIN SR) 150 MG 12 hr tablet Take 1 tablet (150 mg total) by mouth every morning.   carvedilol (COREG) 25 MG tablet Take 1 tablet (25 mg total) by mouth 2 (two) times daily.   cholecalciferol (VITAMIN D3) 25 MCG (1000 UT) tablet Take 4,000 Units by mouth daily.   COVID-19 mRNA bivalent vaccine, Moderna, (MODERNA COVID-19 BIVAL BOOSTER) 50 MCG/0.5ML injection Inject into the muscle.   COVID-19 mRNA Vac-TriS, Pfizer, SUSP injection Inject into the muscle.   doxycycline (VIBRA-TABS) 100 MG tablet Take 1 tablet (100 mg total) by mouth 2 (two) times daily.  EPINEPHrine (EPIPEN 2-PAK) 0.3 mg/0.3 mL IJ SOAJ injection Use as directed for severe allergic reaction   fluticasone (FLONASE) 50 MCG/ACT nasal spray Place 2 sprays into both nostrils daily as needed for allergies.   furosemide (LASIX) 20 MG tablet Take 1 tablet (20 mg total) by mouth daily as needed for edema (weight gain of >3 lbs/24 hours).   hydrochlorothiazide (HYDRODIURIL) 25 MG tablet Take 1 tablet (25 mg total) by mouth daily.   ibuprofen (ADVIL,MOTRIN) 600 MG tablet Take 1 tablet (600 mg total) by mouth every 6 (six) hours as needed.   losartan (COZAAR) 100 MG tablet Take 1 tablet (100 mg total) by mouth daily.   medroxyPROGESTERone (PROVERA) 10 MG tablet TAKE 1 TABLET (10 MG TOTAL) BY MOUTH DAILY.   metFORMIN (GLUCOPHAGE) 500 MG tablet Take 1 tablet (500 mg total) by mouth 2 (two) times  daily with a meal.   montelukast (SINGULAIR) 10 MG tablet Take 1 tablet (10 mg total) by mouth at bedtime as needed.   Omega-3 Fatty Acids (FISH OIL PO) Take 1 capsule by mouth daily.   pantoprazole (PROTONIX) 40 MG tablet TAKE 1 TABLET BY MOUTH ONCE DAILY   potassium chloride SA (KLOR-CON) 20 MEQ tablet TAKE 1 TABLET (20 MEQ TOTAL) BY MOUTH 3 (THREE) TIMES DAILY.   Probiotic Product (PROBIOTIC-10 PO) Take by mouth as needed.    tirzepatide ([name]) 10 MG/0.5ML Pen Inject 10 mg into the skin once a week.   TURMERIC PO Take 1 capsule by mouth daily.     Allergies:   Penicillins and Victoza [liraglutide]   Social History   Socioeconomic History   Marital status: Single    Spouse name: Not on file   Number of children: Not on file   Years of education: Not on file   Highest education level: Not on file  Occupational History   Occupation: Pharmacist, hospital, [employer] manager  Tobacco Use   Smoking status: Former    Packs/day: 1.00    Years: 15.00    Pack years: 15.00    Types: Cigarettes    Quit date: 09/03/2001    Years since quitting: 19.8   Smokeless tobacco: Never  Vaping Use   Vaping Use: Never used  Substance and Sexual Activity   Alcohol use: Yes    Comment: rare   Drug use: No   Sexual activity: Yes    Birth control/protection: None  Other Topics Concern   Not on file  Social History Narrative   Not on file   Social Determinants of Health   Financial Resource Strain: Not on file  Food Insecurity: Not on file  Transportation Needs: Not on file  Physical Activity: Not on file  Stress: Not on file  Social Connections: Not on file     Family History: The patient's family history includes Cancer in her father; Heart attack (age of onset: 61) in her sister; Heart attack (age of onset: 54) in her mother; Heart disease in her mother; Hypertension in her mother and another family member; Obesity in her mother; Prostate cancer in her father. There is no history of Colon cancer,  Colon polyps, Esophageal cancer, Stomach cancer, or Rectal cancer.  ROS:   Review of Systems  Constitution: Negative for decreased appetite, fever and weight gain.  HENT: Negative for congestion, ear discharge, hoarse voice and sore throat.   Eyes: Negative for discharge, redness, vision loss in right eye and visual halos.  Cardiovascular: Negative for chest pain, dyspnea on exertion, leg swelling, orthopnea  and palpitations.  Respiratory: Negative for cough, hemoptysis, shortness of breath and snoring.   Endocrine: Negative for heat intolerance and polyphagia.  Hematologic/Lymphatic: Negative for bleeding problem. Does not bruise/bleed easily.  Skin: Negative for flushing, nail changes, rash and suspicious lesions.  Musculoskeletal: Negative for arthritis, joint pain, muscle cramps, myalgias, neck pain and stiffness.  Gastrointestinal: Negative for abdominal pain, bowel incontinence, diarrhea and excessive appetite.  Genitourinary: Negative for decreased libido, genital sores and incomplete emptying.  Neurological: Negative for brief paralysis, focal weakness, headaches and loss of balance.  Psychiatric/Behavioral: Negative for altered mental status, depression and suicidal ideas.  Allergic/Immunologic: Negative for HIV exposure and persistent infections.    EKGs/Labs/Other Studies Reviewed:    The following studies were reviewed today:   EKG: None today  CCTA 11/07/2020 Aorta: Normal size.  No calcifications.  No dissection.   Aortic Valve:  Trileaflet.  No calcifications.   Coronary Arteries:  Normal coronary origin.  Right dominance.   RCA is a large dominant artery that gives rise to PDA and PLVB. There is no plaque.   Left main is a large artery that gives rise to LAD and LCX arteries.   LAD is a large vessel that has no plaque.   LCX is a non-dominant artery that gives rise to one large OM1 branch. There is no plaque.   Other findings:   Normal pulmonary vein  drainage into the left atrium.   Normal left atrial appendage without a thrombus.   Normal size of the pulmonary artery.   IMPRESSION: 1. Coronary calcium score of 0. This was 0 percentile for age and sex matched control.   2. Normal coronary origin with right dominance.   3. No evidence of CAD.   Berniece Salines, DO     Electronically Signed   By: Berniece Salines DO   On: 11/07/2020 17:04    Addended by Berniece Salines, DO on 11/07/2020  5:06 PM   Study Result  Narrative & Impression  EXAM: OVER-READ INTERPRETATION  CT CHEST   The following report is an over-read performed by radiologist Dr. Vinnie Langton of [facility], [place] on 11/07/2020. This over-read does not include interpretation of cardiac or coronary anatomy or pathology. The coronary calcium score/coronary CTA interpretation by the cardiologist is attached.   COMPARISON:  None.   FINDINGS: Within the visualized portions of the thorax there are no suspicious appearing pulmonary nodules or masses, there is no acute consolidative airspace disease, no pleural effusions, no pneumothorax and no lymphadenopathy. Visualized portions of the upper abdomen are unremarkable. There are no aggressive appearing lytic or blastic lesions noted in the visualized portions of the skeleton.   IMPRESSION: No significant incidental noncardiac findings are noted.   Electronically Signed: By: Vinnie Langton M.D. On: 11/07/2020 10:53    Transthoracic echocardiogram November 14, 2020 IMPRESSIONS   1. Left ventricular ejection fraction, by estimation, is 60 to 65%. The  left ventricle has normal function. The left ventricle has no regional  wall motion abnormalities. Left ventricular diastolic parameters are  consistent with Grade I diastolic  dysfunction (impaired relaxation).   2. Right ventricular systolic function is normal. The right ventricular  size is normal.   3. The mitral valve is normal in structure. No evidence of  mitral valve  regurgitation. No evidence of mitral stenosis.   4. The aortic valve is normal in structure. Aortic valve regurgitation is  not visualized. No aortic stenosis is present.   5. The inferior vena cava  is normal in size with greater than 50%  respiratory variability, suggesting right atrial pressure of 3 mmHg.    ZIO monitor  The patient wore the monitor for 6 days 20 hours starting October 19, 2020. Indication: Palpitations   The minimum heart rate was 71 bpm, maximum heart rate was 120 bpm, and average heart rate was 71 bpm. Predominant underlying rhythm was Sinus Rhythm.     Premature atrial complexes were rare less than 1%. Premature Ventricular complexes were rare less than 1%.   No ventricular tachycardia, no pauses, No AV block, no supraventricular tachycardia and no atrial fibrillation present.   9 patient triggered events associated with premature atrial complexes and premature ventricular complex.  10 diary events associated with sinus rhythm and premature ventricular complex.     Conclusion: This study is remarkable for rare symptomatic premature atrial complex and premature ventricular complex.    Recent Labs: 06/06/2021: ALT 9; BUN 8; Creatinine, Ser 0.63; Hemoglobin 12.4; Magnesium 1.8; Platelets 303.0; Potassium 3.5; Sodium 139; TSH 1.32  Recent Lipid Panel    Component Value Date/Time   CHOL 111 06/06/2021 1050   CHOL 129 07/07/2019 0900   TRIG 99.0 06/06/2021 1050   HDL 33.70 (L) 06/06/2021 1050   HDL 33 (L) 07/07/2019 0900   CHOLHDL 3 06/06/2021 1050   VLDL 19.8 06/06/2021 1050   LDLCALC 58 06/06/2021 1050   LDLCALC 64 11/28/2020 1358    Physical Exam:    VS:  BP (!) 142/80 (BP Location: Left Arm)   Pulse 97   Ht 5\' 10"  (1.778 m)   Wt 298 lb 12.8 oz (135.5 kg)   SpO2 97%   BMI 42.87 kg/m     Wt Readings from Last 3 Encounters:  07/17/21 298 lb 12.8 oz (135.5 kg)  07/11/21 288 lb (130.6 kg)  06/07/21 289 lb (131.1 kg)     GEN: Well  nourished, well developed in no acute distress HEENT: Normal NECK: No JVD; No carotid bruits LYMPHATICS: No lymphadenopathy CARDIAC: S1S2 noted,RRR, no murmurs, rubs, gallops RESPIRATORY:  Clear to auscultation without rales, wheezing or rhonchi  ABDOMEN: Soft, non-tender, non-distended, +bowel sounds, no guarding. EXTREMITIES: No edema, No cyanosis, no clubbing MUSCULOSKELETAL:  No deformity  SKIN: Warm and dry NEUROLOGIC:  Alert and oriented x 3, non-focal PSYCHIATRIC:  Normal affect, good insight  ASSESSMENT:    1. Hypertension, unspecified type   2. Symptomatic PVCs   3. Morbid obesity ([place])    PLAN:    She did not take her medication this morning therefore I am not going to increase her antihypertensive given the fact that her most recent blood pressure on July 11, 276 showed a systolic of 412 and the patient tells me she is running between 878M systolic at home. I have advised the patient to continue to take her medications.  The patient understands the need to lose weight with diet and exercise. We have discussed specific strategies for this.  The patient is in agreement with the above plan. The patient left the office in stable condition.  The patient will follow up in 1 year or sooner if needed.   Medication Adjustments/Labs and Tests Ordered: Current medicines are reviewed at length with the patient today.  Concerns regarding medicines are outlined above.  No orders of the defined types were placed in this encounter.  No orders of the defined types were placed in this encounter.   Patient Instructions  Medication Instructions:  Your physician recommends that you continue on  your current medications as directed. Please refer to the Current Medication list given to you today.  *If you need a refill on your cardiac medications before your next appointment, please call your pharmacy*   Lab Work: None If you have labs (blood work) drawn today and your tests are  completely normal, you will receive your results only by: [place] (if you have MyChart) OR A paper copy in the mail If you have any lab test that is abnormal or we need to change your treatment, we will call you to review the results.   Testing/Procedures: None   Follow-Up: At [facility], you and your health needs are our priority.  As part of our continuing mission to provide you with exceptional heart care, we have created designated Provider Care Teams.  These Care Teams include your primary Cardiologist (physician) and Advanced Practice Providers (APPs -  Physician Assistants and Nurse Practitioners) who all work together to provide you with the care you need, when you need it.  We recommend signing up for the patient portal called "MyChart".  Sign up information is provided on this After Visit Summary.  MyChart is used to connect with patients for Virtual Visits (Telemedicine).  Patients are able to view lab/test results, encounter notes, upcoming appointments, etc.  Non-urgent messages can be sent to your provider as well.   To learn more about what you can do with MyChart, go to [email or web address].    Your next appointment:   1 year(s)  The format for your next appointment:   In Person  Provider:   Berniece Salines, DO     Other Instructions     Adopting a Healthy Lifestyle.  Know what a healthy weight is for you (roughly BMI <25) and aim to maintain this   Aim for 7+ servings of fruits and vegetables daily   65-80+ fluid ounces of water or unsweet tea for healthy kidneys   Limit to max 1 drink of alcohol per day; avoid smoking/tobacco   Limit animal fats in diet for cholesterol and heart health - choose grass fed whenever available   Avoid highly processed foods, and foods high in saturated/trans fats   Aim for low stress - take time to unwind and care for your mental health   Aim for 150 min of moderate intensity exercise weekly for heart health, and  weights twice weekly for bone health   Aim for 7-9 hours of sleep daily   When it comes to diets, agreement about the perfect plan isnt easy to find, even among the experts. Experts at the [employer] developed an idea known as the Healthy Eating Plate. Just imagine a plate divided into logical, healthy portions.   The emphasis is on diet quality:   Load up on vegetables and fruits - one-half of your plate: Aim for color and variety, and remember that potatoes dont count.   Go for whole grains - one-quarter of your plate: Whole wheat, barley, wheat berries, quinoa, oats, brown rice, and foods made with them. If you want pasta, go with whole wheat pasta.   Protein power - one-quarter of your plate: Fish, chicken, beans, and nuts are all healthy, versatile protein sources. Limit red meat.   The diet, however, does go beyond the plate, offering a few other suggestions.   Use healthy plant oils, such as olive, canola, soy, corn, sunflower and peanut. Check the labels, and avoid partially hydrogenated oil, which have unhealthy trans fats.  If youre thirsty, drink water. Coffee and tea are good in moderation, but skip sugary drinks and limit milk and dairy products to one or two daily servings.   The type of carbohydrate in the diet is more important than the amount. Some sources of carbohydrates, such as vegetables, fruits, whole grains, and beans-are healthier than others.   Finally, stay active  Signed, Berniece Salines, DO  07/18/2021 1:18 PM    [facility]

## 2021-07-18 ENCOUNTER — Other Ambulatory Visit (HOSPITAL_BASED_OUTPATIENT_CLINIC_OR_DEPARTMENT_OTHER): Payer: Self-pay

## 2021-07-30 ENCOUNTER — Encounter (INDEPENDENT_AMBULATORY_CARE_PROVIDER_SITE_OTHER): Payer: Self-pay | Admitting: Family Medicine

## 2021-07-30 DIAGNOSIS — E1169 Type 2 diabetes mellitus with other specified complication: Secondary | ICD-10-CM

## 2021-07-31 ENCOUNTER — Other Ambulatory Visit (HOSPITAL_BASED_OUTPATIENT_CLINIC_OR_DEPARTMENT_OTHER): Payer: Self-pay

## 2021-07-31 MED ORDER — TIRZEPATIDE 10 MG/0.5ML ~~LOC~~ SOAJ
10.0000 mg | SUBCUTANEOUS | 0 refills | Status: DC
  Filled 2021-07-31: qty 2, 28d supply, fill #0

## 2021-07-31 NOTE — Telephone Encounter (Signed)
Brandi Lester 

## 2021-07-31 NOTE — Telephone Encounter (Signed)
LAST APPOINTMENT DATE: 07/11/21 NEXT APPOINTMENT DATE: 08/22/21   [facility] [address], [place] [address] Phone: [phone] Fax: [phone]  Patient is requesting a refill of the following medications: Requested Prescriptions    No prescriptions requested or ordered in this encounter    Date last filled: 07/03/21 Previously prescribed by   [facility]   Lab Results  Component Value Date   HGBA1C 5.5 06/06/2021   HGBA1C 5.6 11/28/2020   HGBA1C 6.1 03/21/2020   Lab Results  Component Value Date   MICROALBUR 0.50 12/15/2012   LDLCALC 58 06/06/2021   CREATININE 0.63 06/06/2021   Lab Results  Component Value Date   VD25OH 78.96 06/06/2021   VD25OH 78 11/28/2020   VD25OH 56.44 03/21/2020    BP Readings from Last 3 Encounters:  07/17/21 (!) 142/80  07/11/21 119/74  06/07/21 112/74

## 2021-08-08 ENCOUNTER — Other Ambulatory Visit: Payer: Self-pay | Admitting: Family Medicine

## 2021-08-08 ENCOUNTER — Encounter (INDEPENDENT_AMBULATORY_CARE_PROVIDER_SITE_OTHER): Payer: Self-pay | Admitting: Family Medicine

## 2021-08-08 DIAGNOSIS — Z1231 Encounter for screening mammogram for malignant neoplasm of breast: Secondary | ICD-10-CM

## 2021-08-08 DIAGNOSIS — E785 Hyperlipidemia, unspecified: Secondary | ICD-10-CM

## 2021-08-08 DIAGNOSIS — E1169 Type 2 diabetes mellitus with other specified complication: Secondary | ICD-10-CM

## 2021-08-09 ENCOUNTER — Other Ambulatory Visit (HOSPITAL_BASED_OUTPATIENT_CLINIC_OR_DEPARTMENT_OTHER): Payer: Self-pay

## 2021-08-09 MED ORDER — ATORVASTATIN CALCIUM 10 MG PO TABS
ORAL_TABLET | Freq: Every day | ORAL | 0 refills | Status: DC
  Filled 2021-08-09: qty 90, 90d supply, fill #0

## 2021-08-09 NOTE — Telephone Encounter (Signed)
LAST APPOINTMENT DATE: 07/30/21 NEXT APPOINTMENT DATE: 08/2021   [facility] [address] [place] [address] Phone: [phone] Fax: [phone]  Patient is requesting a refill of the following medications: Requested Prescriptions    No prescriptions requested or ordered in this encounter    Date last filled: 05/11/21  (90 day supply) Previously prescribed by Dr Jearld Shines  Lab Results  Component Value Date   HGBA1C 5.5 06/06/2021   HGBA1C 5.6 11/28/2020   HGBA1C 6.1 03/21/2020   Lab Results  Component Value Date   MICROALBUR 0.50 12/15/2012   LDLCALC 58 06/06/2021   CREATININE 0.63 06/06/2021   Lab Results  Component Value Date   VD25OH 78.96 06/06/2021   VD25OH 78 11/28/2020   VD25OH 56.44 03/21/2020    BP Readings from Last 3 Encounters:  07/17/21 (!) 142/80  07/11/21 119/74  06/07/21 112/74

## 2021-08-09 NOTE — Telephone Encounter (Signed)
Pt last seen by Dawn Whitmire, FNP.  

## 2021-08-22 ENCOUNTER — Other Ambulatory Visit: Payer: Self-pay

## 2021-08-22 ENCOUNTER — Encounter (INDEPENDENT_AMBULATORY_CARE_PROVIDER_SITE_OTHER): Payer: Self-pay | Admitting: Family Medicine

## 2021-08-22 ENCOUNTER — Other Ambulatory Visit (HOSPITAL_BASED_OUTPATIENT_CLINIC_OR_DEPARTMENT_OTHER): Payer: Self-pay

## 2021-08-22 ENCOUNTER — Ambulatory Visit (INDEPENDENT_AMBULATORY_CARE_PROVIDER_SITE_OTHER): Payer: BC Managed Care – PPO | Admitting: Family Medicine

## 2021-08-22 VITALS — BP 119/75 | HR 92 | Temp 99.0°F | Ht 70.0 in | Wt 286.0 lb

## 2021-08-22 DIAGNOSIS — E1169 Type 2 diabetes mellitus with other specified complication: Secondary | ICD-10-CM | POA: Diagnosis not present

## 2021-08-22 DIAGNOSIS — Z6841 Body Mass Index (BMI) 40.0 and over, adult: Secondary | ICD-10-CM | POA: Diagnosis not present

## 2021-08-22 DIAGNOSIS — F3289 Other specified depressive episodes: Secondary | ICD-10-CM | POA: Diagnosis not present

## 2021-08-22 MED ORDER — BUPROPION HCL ER (SR) 200 MG PO TB12
200.0000 mg | ORAL_TABLET | Freq: Every morning | ORAL | 0 refills | Status: DC
  Filled 2021-08-22: qty 30, 30d supply, fill #0

## 2021-08-22 MED ORDER — METFORMIN HCL 500 MG PO TABS
500.0000 mg | ORAL_TABLET | Freq: Two times a day (BID) | ORAL | 0 refills | Status: DC
  Filled 2021-08-22: qty 180, 90d supply, fill #0

## 2021-08-22 MED ORDER — TIRZEPATIDE 10 MG/0.5ML ~~LOC~~ SOAJ
10.0000 mg | SUBCUTANEOUS | 0 refills | Status: DC
  Filled 2021-08-22: qty 2, 28d supply, fill #0

## 2021-08-22 NOTE — Progress Notes (Signed)
Chief Complaint:   OBESITY Brandi Lester is here to discuss her progress with her obesity treatment plan along with follow-up of her obesity related diagnoses. Brandi Lester is on keeping a food journal and adhering to recommended goals of 1600-1800 calories and 90 grams of protein and states she is following her eating plan approximately 75% of the time. Brandi Lester states she is walking and using the stationary bike for 45 minutes 3-4 times per week.  Today's visit was #: 66 Starting weight: 325 lbs Starting date: 05/23/2017 Today's weight: 286 lbs Today's date: 08/22/2021 Total lbs lost to date: 39 lbs Total lbs lost since last in-office visit: 2 lbs  Interim History: Brandi Lester is doing very well. She journals about 5 days per week. When journals she meets her calorie and protein goals.  Subjective:   1. Type 2 diabetes mellitus with other specified complication, without long-term current use of insulin (HCC) Brandi Lester's Type 2 diabetes mellitus is well controlled. Her A1C is 5.5. She is on 10 mg [name] and Metformin 500 mg BID.  Lab Results  Component Value Date   HGBA1C 5.5 06/06/2021   HGBA1C 5.6 11/28/2020   HGBA1C 6.1 03/21/2020   Lab Results  Component Value Date   MICROALBUR 0.50 12/15/2012   LDLCALC 58 06/06/2021   CREATININE 0.63 06/06/2021   Lab Results  Component Value Date   INSULIN 20.7 12/01/2019   INSULIN 12.9 07/07/2019   INSULIN 21.2 11/20/2018   INSULIN 15.4 07/14/2018   INSULIN 12.9 09/12/2017    2. Other depression, with emotional eating Brandi Lester notes increased tendency to snack, but portions are smaller due to [facility]. She is currently taking bupropion 150 mg QAM.  Assessment/Plan:   1. Type 2 diabetes mellitus with other specified complication, without long-term current use of insulin (HCC) We will refill [name] 10 mg weekly and we will refill Metformin 500 mg twice daily for 3 months with no refills.  - metFORMIN (GLUCOPHAGE) 500 MG tablet; Take 1  tablet (500 mg total) by mouth 2 (two) times daily with a meal.  Dispense: 180 tablet; Refill: 0 - tirzepatide ([name]) 10 MG/0.5ML Pen; Inject 10 mg into the skin once a week.  Dispense: 6 mL; Refill: 0  2. Other depression, with emotional eating  Brandi Lester agrees to increase dose of Wellbutrin 200 mg daily for 1 month with no refills.   - buPROPion (WELLBUTRIN SR) 200 MG 12 hr tablet; Take 1 tablet (200 mg total) by mouth in the morning.  Dispense: 30 tablet; Refill: 0  3. Obesity: BMI 41.04 Brandi Lester is currently in the action stage of change. As such, her goal is to continue with weight loss efforts. She has agreed to keeping a food journal and adhering to recommended goals of 1600-1800 calories and 90 grams of protein daily.   Brandi Lester will increase journaling to 6 days per week.  Exercise goals:  As is.  Behavioral modification strategies: decreasing simple carbohydrates and emotional eating strategies.  Brandi Lester has agreed to follow-up with our clinic in 4 weeks.  Objective:   Blood pressure 119/75, pulse 92, temperature 99 F (37.2 C), height 5\' 10"  (1.778 m), weight 286 lb (129.7 kg), SpO2 100 %. Body mass index is 41.04 kg/m.  General: Cooperative, alert, well developed, in no acute distress. HEENT: Conjunctivae and lids unremarkable. Cardiovascular: Regular rhythm.  Lungs: Normal work of breathing. Neurologic: No focal deficits.   Lab Results  Component Value Date   CREATININE 0.63 06/06/2021   BUN 8 06/06/2021  NA 139 06/06/2021   K 3.5 06/06/2021   CL 101 06/06/2021   CO2 28 06/06/2021   Lab Results  Component Value Date   ALT 9 06/06/2021   AST 10 06/06/2021   ALKPHOS 77 06/06/2021   BILITOT 0.5 06/06/2021   Lab Results  Component Value Date   HGBA1C 5.5 06/06/2021   HGBA1C 5.6 11/28/2020   HGBA1C 6.1 03/21/2020   HGBA1C 5.9 (H) 12/01/2019   HGBA1C 5.9 (H) 07/07/2019   Lab Results  Component Value Date   INSULIN 20.7 12/01/2019   INSULIN 12.9  07/07/2019   INSULIN 21.2 11/20/2018   INSULIN 15.4 07/14/2018   INSULIN 12.9 09/12/2017   Lab Results  Component Value Date   TSH 1.32 06/06/2021   Lab Results  Component Value Date   CHOL 111 06/06/2021   HDL 33.70 (L) 06/06/2021   LDLCALC 58 06/06/2021   TRIG 99.0 06/06/2021   CHOLHDL 3 06/06/2021   Lab Results  Component Value Date   VD25OH 78.96 06/06/2021   VD25OH 78 11/28/2020   VD25OH 56.44 03/21/2020   Lab Results  Component Value Date   WBC 6.3 06/06/2021   HGB 12.4 06/06/2021   HCT 36.9 06/06/2021   MCV 86.3 06/06/2021   PLT 303.0 06/06/2021   No results found for: IRON, TIBC, FERRITIN  Attestation Statements:   Reviewed by clinician on day of visit: allergies, medications, problem list, medical history, surgical history, family history, social history, and previous encounter notes.  I, Lizbeth Bark, RMA, am acting as Location manager for [employer], [place].  I have reviewed the above documentation for accuracy and completeness, and I agree with the above. -  Georgianne Fick, FNP

## 2021-09-01 ENCOUNTER — Other Ambulatory Visit (HOSPITAL_BASED_OUTPATIENT_CLINIC_OR_DEPARTMENT_OTHER): Payer: Self-pay

## 2021-09-04 ENCOUNTER — Other Ambulatory Visit (HOSPITAL_BASED_OUTPATIENT_CLINIC_OR_DEPARTMENT_OTHER): Payer: Self-pay

## 2021-09-08 ENCOUNTER — Encounter: Payer: Self-pay | Admitting: Family Medicine

## 2021-09-18 ENCOUNTER — Encounter (INDEPENDENT_AMBULATORY_CARE_PROVIDER_SITE_OTHER): Payer: Self-pay | Admitting: Family Medicine

## 2021-09-18 ENCOUNTER — Other Ambulatory Visit: Payer: Self-pay

## 2021-09-18 ENCOUNTER — Other Ambulatory Visit (HOSPITAL_BASED_OUTPATIENT_CLINIC_OR_DEPARTMENT_OTHER): Payer: Self-pay

## 2021-09-18 ENCOUNTER — Ambulatory Visit (INDEPENDENT_AMBULATORY_CARE_PROVIDER_SITE_OTHER): Payer: BC Managed Care – PPO | Admitting: Family Medicine

## 2021-09-18 VITALS — BP 132/77 | HR 94 | Temp 99.0°F | Ht 70.0 in | Wt 292.0 lb

## 2021-09-18 DIAGNOSIS — Z6841 Body Mass Index (BMI) 40.0 and over, adult: Secondary | ICD-10-CM | POA: Diagnosis not present

## 2021-09-18 DIAGNOSIS — F3289 Other specified depressive episodes: Secondary | ICD-10-CM | POA: Diagnosis not present

## 2021-09-18 DIAGNOSIS — E1169 Type 2 diabetes mellitus with other specified complication: Secondary | ICD-10-CM

## 2021-09-18 MED ORDER — TIRZEPATIDE 12.5 MG/0.5ML ~~LOC~~ SOAJ
12.5000 mg | SUBCUTANEOUS | 0 refills | Status: DC
  Filled 2021-09-18 (×2): qty 2, 28d supply, fill #0

## 2021-09-18 MED ORDER — BUPROPION HCL ER (SR) 200 MG PO TB12
200.0000 mg | ORAL_TABLET | Freq: Every morning | ORAL | 0 refills | Status: DC
  Filled 2021-09-18: qty 90, 90d supply, fill #0

## 2021-09-18 NOTE — Progress Notes (Signed)
Chief Complaint:   OBESITY Brandi Lester is here to discuss her progress with her obesity treatment plan along with follow-up of her obesity related diagnoses. Brandi Lester is on keeping a food journal and adhering to recommended goals of 1600-1800 calories and 90 grams of protein and states she is following her eating plan approximately 75% of the time. Brandi Lester states she is walking and using the stationary bike for 45 minutes 4 times per week.  Today's visit was #: 75 Starting weight: 325 lbs Starting date: 05/23/2017 Today's weight: 292 lbs Today's date: 09/18/2021 Total lbs lost to date: 33 lbs Total lbs lost since last in-office visit: 0  Interim History: Brandi Lester notes some extra celebration eating over the holidays. She also says she tends to snack more about 4 days after her [name] injection as the effects wear off.  Subjective:   1. Type 2 diabetes mellitus with other specified complication, without long-term current use of insulin (HCC) Brandi Lester's diabetes is well controlled. Her last A1C is 5.5. She is on Metformin and [name]. She notes appetite suppression wanes after 4 days with [name].   Lab Results  Component Value Date   HGBA1C 5.5 06/06/2021   HGBA1C 5.6 11/28/2020   HGBA1C 6.1 03/21/2020   Lab Results  Component Value Date   MICROALBUR 0.50 12/15/2012   LDLCALC 58 06/06/2021   CREATININE 0.63 06/06/2021   Lab Results  Component Value Date   INSULIN 20.7 12/01/2019   INSULIN 12.9 07/07/2019   INSULIN 21.2 11/20/2018   INSULIN 15.4 07/14/2018   INSULIN 12.9 09/12/2017   2. Other depression, with emotional eating Her dose of bupropion was increased to 200 mg last office visit. She feels it helped with snacking and cravings.   Assessment/Plan:   1.Type 2 diabetes mellitus with other specified complication, without long-term current use of insulin (HCC) Brandi Lester agrees to increase dose [name] to 12.5 mg weekly.  - tirzepatide ([name]) 12.5 MG/0.5ML Pen;  Inject 12.5 mg into the skin once a week.  Dispense: 2 mL; Refill: 0  2.  Other depression, with emotional eating We will refill bupropion. - buPROPion (WELLBUTRIN SR) 200 MG 12 hr tablet; Take 1 tablet (200 mg total) by mouth in the morning.  Dispense: 90 tablet; Refill: 0  3. Obesity: BMI 41.9 [name] is currently in the action stage of change. As such, her goal is to continue with weight loss efforts. She has agreed to the Category 4 Plan or keeping a food journal and adhering to recommended goals of 1600-1800 calories and 90 grams of protein daily.  Brandi Lester will start back Category 4 to give her more structure. She may go back to journaling if she finds cat 4 is not working well for her.   Exercise goals:  As is.  Behavioral modification strategies: decreasing simple carbohydrates, increasing water intake, and better snacking choices.  Brandi Lester has agreed to follow-up with our clinic in 3-4 weeks.  Objective:   Blood pressure 132/77, pulse 94, temperature 99 F (37.2 C), height 5\' 10"  (1.778 m), weight 292 lb (132.5 kg), SpO2 99 %. Body mass index is 41.9 kg/m.  General: Cooperative, alert, well developed, in no acute distress. HEENT: Conjunctivae and lids unremarkable. Cardiovascular: Regular rhythm.  Lungs: Normal work of breathing. Neurologic: No focal deficits.   Lab Results  Component Value Date   CREATININE 0.63 06/06/2021   BUN 8 06/06/2021   NA 139 06/06/2021   K 3.5 06/06/2021   CL 101 06/06/2021   CO2  28 06/06/2021   Lab Results  Component Value Date   ALT 9 06/06/2021   AST 10 06/06/2021   ALKPHOS 77 06/06/2021   BILITOT 0.5 06/06/2021   Lab Results  Component Value Date   HGBA1C 5.5 06/06/2021   HGBA1C 5.6 11/28/2020   HGBA1C 6.1 03/21/2020   HGBA1C 5.9 (H) 12/01/2019   HGBA1C 5.9 (H) 07/07/2019   Lab Results  Component Value Date   INSULIN 20.7 12/01/2019   INSULIN 12.9 07/07/2019   INSULIN 21.2 11/20/2018   INSULIN 15.4 07/14/2018   INSULIN  12.9 09/12/2017   Lab Results  Component Value Date   TSH 1.32 06/06/2021   Lab Results  Component Value Date   CHOL 111 06/06/2021   HDL 33.70 (L) 06/06/2021   LDLCALC 58 06/06/2021   TRIG 99.0 06/06/2021   CHOLHDL 3 06/06/2021   Lab Results  Component Value Date   VD25OH 78.96 06/06/2021   VD25OH 78 11/28/2020   VD25OH 56.44 03/21/2020   Lab Results  Component Value Date   WBC 6.3 06/06/2021   HGB 12.4 06/06/2021   HCT 36.9 06/06/2021   MCV 86.3 06/06/2021   PLT 303.0 06/06/2021   No results found for: IRON, TIBC, FERRITIN  Attestation Statements:   Reviewed by clinician on day of visit: allergies, medications, problem list, medical history, surgical history, family history, social history, and previous encounter notes.  I, Lizbeth Bark, RMA, am acting as Location manager for [employer], [place].  I have reviewed the above documentation for accuracy and completeness, and I agree with the above. -  Georgianne Fick, FNP

## 2021-09-26 ENCOUNTER — Encounter: Payer: Self-pay | Admitting: Family Medicine

## 2021-09-26 LAB — HM DIABETES EYE EXAM

## 2021-09-27 ENCOUNTER — Ambulatory Visit
Admission: RE | Admit: 2021-09-27 | Discharge: 2021-09-27 | Disposition: A | Discharge status: Home / Self Care | Payer: BC Managed Care – PPO | Source: Ambulatory Visit

## 2021-09-27 DIAGNOSIS — Z1231 Encounter for screening mammogram for malignant neoplasm of breast: Secondary | ICD-10-CM

## 2021-10-05 ENCOUNTER — Other Ambulatory Visit (HOSPITAL_BASED_OUTPATIENT_CLINIC_OR_DEPARTMENT_OTHER): Payer: Self-pay

## 2021-10-23 ENCOUNTER — Other Ambulatory Visit (HOSPITAL_BASED_OUTPATIENT_CLINIC_OR_DEPARTMENT_OTHER): Payer: Self-pay

## 2021-10-23 ENCOUNTER — Ambulatory Visit (INDEPENDENT_AMBULATORY_CARE_PROVIDER_SITE_OTHER): Payer: BC Managed Care – PPO | Admitting: Family Medicine

## 2021-10-23 ENCOUNTER — Other Ambulatory Visit: Payer: Self-pay

## 2021-10-23 ENCOUNTER — Encounter (INDEPENDENT_AMBULATORY_CARE_PROVIDER_SITE_OTHER): Payer: Self-pay | Admitting: Family Medicine

## 2021-10-23 VITALS — BP 131/80 | HR 78 | Temp 98.2°F | Ht 70.0 in | Wt 280.0 lb

## 2021-10-23 DIAGNOSIS — E1169 Type 2 diabetes mellitus with other specified complication: Secondary | ICD-10-CM

## 2021-10-23 DIAGNOSIS — E785 Hyperlipidemia, unspecified: Secondary | ICD-10-CM

## 2021-10-23 DIAGNOSIS — Z6841 Body Mass Index (BMI) 40.0 and over, adult: Secondary | ICD-10-CM

## 2021-10-23 DIAGNOSIS — Z7984 Long term (current) use of oral hypoglycemic drugs: Secondary | ICD-10-CM

## 2021-10-23 DIAGNOSIS — E669 Obesity, unspecified: Secondary | ICD-10-CM | POA: Diagnosis not present

## 2021-10-23 MED ORDER — TIRZEPATIDE 12.5 MG/0.5ML ~~LOC~~ SOAJ
12.5000 mg | SUBCUTANEOUS | 0 refills | Status: DC
  Filled 2021-10-23: qty 2, 28d supply, fill #0

## 2021-10-23 MED ORDER — METFORMIN HCL 500 MG PO TABS
500.0000 mg | ORAL_TABLET | Freq: Two times a day (BID) | ORAL | 0 refills | Status: DC
  Filled 2021-10-23 – 2021-12-01 (×3): qty 180, 90d supply, fill #0

## 2021-10-23 MED ORDER — ATORVASTATIN CALCIUM 10 MG PO TABS
ORAL_TABLET | Freq: Every day | ORAL | 0 refills | Status: DC
  Filled 2021-10-23: qty 90, 90d supply, fill #0

## 2021-10-23 NOTE — Progress Notes (Signed)
Chief Complaint:   OBESITY Brandi Lester is here to discuss her progress with her obesity treatment plan along with follow-up of her obesity related diagnoses. Brandi Lester is on the Category 4 Plan or keeping a food journal and adhering to recommended goals of 1600-1800 calories and 90 grams of protein and states she is following her eating plan approximately 75% of the time. Brandi Lester states she is walking, using the exercise bike for 45 minutes 3-4 times per week.  Today's visit was #: 81 Starting weight: 325 lbs Starting date: 05/23/2017 Today's weight: 280 lbs Today's date: 10/23/2021 Total lbs lost to date: 45 lbs Total lbs lost since last in-office visit: 12 lbs  Interim History: Brandi Lester had a stomach bug last week and lost some weight.  She has lost 12 lbs since last office visit.  She is getting in her lunch.  She has a protein shake at breakfast (Revival shake).  She is getting her in her prescribed protein.  She has stopped for take-out a few times after school.  Subjective:   1. Type 2 diabetes mellitus with other specified complication, without long-term current use of insulin (HCC) Well controlled.  Last A1c was 5.5.  Does not check sugars at home.  On [name] and metformin.  Lab Results  Component Value Date   HGBA1C 5.5 06/06/2021   HGBA1C 5.6 11/28/2020   HGBA1C 6.1 03/21/2020   Lab Results  Component Value Date   MICROALBUR 0.50 12/15/2012   LDLCALC 58 06/06/2021   CREATININE 0.63 06/06/2021   Lab Results  Component Value Date   INSULIN 20.7 12/01/2019   INSULIN 12.9 07/07/2019   INSULIN 21.2 11/20/2018   INSULIN 15.4 07/14/2018   INSULIN 12.9 09/12/2017   2. Hyperlipidemia associated with type 2 diabetes mellitus (HCC) Last LDL at goal (58), HDL low at 33.  On atorvastatin daily.  Lab Results  Component Value Date   ALT 9 06/06/2021   AST 10 06/06/2021   ALKPHOS 77 06/06/2021   BILITOT 0.5 06/06/2021   Lab Results  Component Value Date   CHOL 111  06/06/2021   HDL 33.70 (L) 06/06/2021   LDLCALC 58 06/06/2021   TRIG 99.0 06/06/2021   CHOLHDL 3 06/06/2021   Assessment/Plan:   1. Type 2 diabetes mellitus with other specified complication, without long-term current use of insulin (HCC) Refill [name] 2.5 mg weekly. Refill metformin 500 mg twice daily with meals.  - Refill metFORMIN (GLUCOPHAGE) 500 MG tablet; Take 1 tablet (500 mg total) by mouth 2 (two) times daily with a meal.  Dispense: 180 tablet; Refill: 0 - Refill tirzepatide ([name]) 12.5 MG/0.5ML Pen; Inject 12.5 mg into the skin once a week.  Dispense: 2 mL; Refill: 0  2. Hyperlipidemia associated with type 2 diabetes mellitus (HCC) Refill atorvastatin 10 mg daily.  - Refill atorvastatin (LIPITOR) 10 MG tablet; TAKE 1 TABLET BY MOUTH ONCE DAILY  Dispense: 90 tablet; Refill: 0  3. Obesity: BMI 40.18  Brandi Lester is currently in the action stage of change. As such, her goal is to continue with weight loss efforts. She has agreed to the Category 4 Plan.   Exercise goals:  As is.  Behavioral modification strategies: decreasing eating out.  Brandi Lester has agreed to follow-up with our clinic in 4 weeks.  Objective:   Blood pressure 131/80, pulse 78, temperature 98.2 F (36.8 C), height 5\' 10"  (1.778 m), weight 280 lb (127 kg), SpO2 99 %. Body mass index is 40.18 kg/m.  General: Cooperative, alert,  well developed, in no acute distress. HEENT: Conjunctivae and lids unremarkable. Cardiovascular: Regular rhythm.  Lungs: Normal work of breathing. Neurologic: No focal deficits.   Lab Results  Component Value Date   CREATININE 0.63 06/06/2021   BUN 8 06/06/2021   NA 139 06/06/2021   K 3.5 06/06/2021   CL 101 06/06/2021   CO2 28 06/06/2021   Lab Results  Component Value Date   ALT 9 06/06/2021   AST 10 06/06/2021   ALKPHOS 77 06/06/2021   BILITOT 0.5 06/06/2021   Lab Results  Component Value Date   HGBA1C 5.5 06/06/2021   HGBA1C 5.6 11/28/2020   HGBA1C 6.1  03/21/2020   HGBA1C 5.9 (H) 12/01/2019   HGBA1C 5.9 (H) 07/07/2019   Lab Results  Component Value Date   INSULIN 20.7 12/01/2019   INSULIN 12.9 07/07/2019   INSULIN 21.2 11/20/2018   INSULIN 15.4 07/14/2018   INSULIN 12.9 09/12/2017   Lab Results  Component Value Date   TSH 1.32 06/06/2021   Lab Results  Component Value Date   CHOL 111 06/06/2021   HDL 33.70 (L) 06/06/2021   LDLCALC 58 06/06/2021   TRIG 99.0 06/06/2021   CHOLHDL 3 06/06/2021   Lab Results  Component Value Date   VD25OH 78.96 06/06/2021   VD25OH 78 11/28/2020   VD25OH 56.44 03/21/2020   Lab Results  Component Value Date   WBC 6.3 06/06/2021   HGB 12.4 06/06/2021   HCT 36.9 06/06/2021   MCV 86.3 06/06/2021   PLT 303.0 06/06/2021   Attestation Statements:   Reviewed by clinician on day of visit: allergies, medications, problem list, medical history, surgical history, family history, social history, and previous encounter notes.  I, Water quality scientist, CMA, am acting as Location manager for [employer], [place].  I have reviewed the above documentation for accuracy and completeness, and I agree with the above. -  Georgianne Fick, FNP

## 2021-10-24 ENCOUNTER — Other Ambulatory Visit (HOSPITAL_BASED_OUTPATIENT_CLINIC_OR_DEPARTMENT_OTHER): Payer: Self-pay

## 2021-10-25 ENCOUNTER — Other Ambulatory Visit (HOSPITAL_BASED_OUTPATIENT_CLINIC_OR_DEPARTMENT_OTHER): Payer: Self-pay

## 2021-10-26 ENCOUNTER — Other Ambulatory Visit (HOSPITAL_BASED_OUTPATIENT_CLINIC_OR_DEPARTMENT_OTHER): Payer: Self-pay

## 2021-10-26 ENCOUNTER — Ambulatory Visit: Payer: BC Managed Care – PPO | Admitting: Family Medicine

## 2021-10-26 ENCOUNTER — Encounter: Payer: Self-pay | Admitting: Family Medicine

## 2021-10-26 DIAGNOSIS — J0140 Acute pansinusitis, unspecified: Secondary | ICD-10-CM

## 2021-10-26 DIAGNOSIS — R0981 Nasal congestion: Secondary | ICD-10-CM | POA: Diagnosis not present

## 2021-10-26 DIAGNOSIS — J349 Unspecified disorder of nose and nasal sinuses: Secondary | ICD-10-CM | POA: Diagnosis not present

## 2021-10-26 MED ORDER — PREDNISONE 20 MG PO TABS
40.0000 mg | ORAL_TABLET | Freq: Every day | ORAL | 0 refills | Status: AC
  Filled 2021-10-26: qty 10, 5d supply, fill #0

## 2021-10-26 MED ORDER — AZELASTINE HCL 0.1 % NA SOLN
2.0000 | Freq: Two times a day (BID) | NASAL | 2 refills | Status: DC
  Filled 2021-10-26: qty 30, 50d supply, fill #0

## 2021-10-26 MED ORDER — FLUTICASONE PROPIONATE 50 MCG/ACT NA SUSP
2.0000 | Freq: Every day | NASAL | 2 refills | Status: DC | PRN
  Filled 2021-10-26: qty 16, 30d supply, fill #0

## 2021-10-26 MED ORDER — DOXYCYCLINE HYCLATE 100 MG PO TABS
100.0000 mg | ORAL_TABLET | Freq: Two times a day (BID) | ORAL | 0 refills | Status: AC
Start: 2021-10-26 — End: 2021-11-05
  Filled 2021-10-26: qty 20, 10d supply, fill #0

## 2021-10-26 NOTE — Progress Notes (Signed)
Virtual Video Visit via MyChart Note converted to telephone  I connected with  Brandi Lester on 10/26/21 at  1:40 PM EST by the video enabled telemedicine application for MyChart, and verified that I am speaking with the correct person using two identifiers.   I introduced myself as a Designer, jewellery with the practice. We discussed the limitations of evaluation and management by telemedicine and the availability of in person appointments. The patient expressed understanding and agreed to proceed.  Participating parties in this visit include: The patient and the nurse practitioner listed.  The patient is: At home I am: In the office - [facility] [place]:    CC:  Chief Complaint  Patient presents with   Sinus Problem    Nasal congestion, swollen around face, face feels painful Ears itch Started around super bowl    HPI: Brandi Lester is a 56 y.o. year old female presenting today via [place] today for sinusitis .  Patient reports >2 weeks of URI symptoms with nasal congestion, sinus pressure/pain/swelling, ear pressure. Reports she used to have bad allergies and frequent sinus infections - last one was in November, so she would like to see allergist again. She denies any chest pain, dyspnea, wheezing, fevers.    Past medical history, Surgical history, Family history not pertinant except as noted below, Social history, Allergies, and medications have been entered into the medical record, reviewed, and corrections made.   Review of Systems:  All review of systems negative except what is listed in the HPI   Objective:    General:  Speaking clearly in complete sentences. Absent shortness of breath noted.   Alert and oriented x3.   Normal judgment.  Absent acute distress.   Impression and Recommendations:    1. Nasal congestion 2. Sinus problem 3. Acute non-recurrent pansinusitis Given duration, starting Doxy and  prednisone. Refilling Astelin and Flonase. Continue supportive measures including rest, hydration, humidifier use, steam showers, warm compresses to sinuses, warm liquids with lemon and honey, and over-the-counter cough, cold, and analgesics as needed.  Patient aware of signs/symptoms requiring further/urgent evaluation. Allergist referral placed.   - azelastine (ASTELIN) 0.1 % nasal spray; Place 2 sprays into both nostrils 2 (two) times daily. Use in each nostril as directed  Dispense: 30 mL; Refill: 2 - fluticasone (FLONASE) 50 MCG/ACT nasal spray; Place 2 sprays into both nostrils daily as needed for allergies.  Dispense: 16 g; Refill: 2 - doxycycline (VIBRA-TABS) 100 MG tablet; Take 1 tablet (100 mg total) by mouth 2 (two) times daily for 10 days.  Dispense: 14 tablet; Refill: 0 - predniSONE (DELTASONE) 20 MG tablet; Take 2 tablets (40 mg total) by mouth daily with breakfast for 5 days.  Dispense: 10 tablet; Refill: 0 - Ambulatory referral to Allergy   Follow-up if symptoms worsen or fail to improve.    I discussed the assessment and treatment plan with the patient. The patient was provided an opportunity to ask questions and all were answered. The patient agreed with the plan and demonstrated an understanding of the instructions.   The patient was advised to call back or seek an in-person evaluation if the symptoms worsen or if the condition fails to improve as anticipated.  I spent 20 minutes dedicated to the care of this patient on the date of this encounter to include pre-visit chart review of prior notes and results, face-to-face time with the patient, and post-visit ordering of testing as indicated.  Terrilyn Saver, NP '

## 2021-11-13 ENCOUNTER — Other Ambulatory Visit (HOSPITAL_BASED_OUTPATIENT_CLINIC_OR_DEPARTMENT_OTHER): Payer: Self-pay

## 2021-11-20 ENCOUNTER — Other Ambulatory Visit (HOSPITAL_BASED_OUTPATIENT_CLINIC_OR_DEPARTMENT_OTHER): Payer: Self-pay

## 2021-11-20 ENCOUNTER — Other Ambulatory Visit: Payer: Self-pay

## 2021-11-20 ENCOUNTER — Encounter (INDEPENDENT_AMBULATORY_CARE_PROVIDER_SITE_OTHER): Payer: Self-pay | Admitting: Physician Assistant

## 2021-11-20 ENCOUNTER — Ambulatory Visit (INDEPENDENT_AMBULATORY_CARE_PROVIDER_SITE_OTHER): Payer: BC Managed Care – PPO | Admitting: Physician Assistant

## 2021-11-20 VITALS — BP 139/67 | HR 65 | Temp 99.4°F | Ht 70.0 in | Wt 283.0 lb

## 2021-11-20 DIAGNOSIS — E1169 Type 2 diabetes mellitus with other specified complication: Secondary | ICD-10-CM | POA: Diagnosis not present

## 2021-11-20 DIAGNOSIS — Z9189 Other specified personal risk factors, not elsewhere classified: Secondary | ICD-10-CM

## 2021-11-20 DIAGNOSIS — Z7985 Long-term (current) use of injectable non-insulin antidiabetic drugs: Secondary | ICD-10-CM

## 2021-11-20 DIAGNOSIS — Z6841 Body Mass Index (BMI) 40.0 and over, adult: Secondary | ICD-10-CM

## 2021-11-20 DIAGNOSIS — I10 Essential (primary) hypertension: Secondary | ICD-10-CM

## 2021-11-20 DIAGNOSIS — E669 Obesity, unspecified: Secondary | ICD-10-CM | POA: Diagnosis not present

## 2021-11-20 MED ORDER — TIRZEPATIDE 12.5 MG/0.5ML ~~LOC~~ SOAJ
12.5000 mg | SUBCUTANEOUS | 0 refills | Status: DC
  Filled 2021-11-20: qty 2, 28d supply, fill #0

## 2021-11-22 NOTE — Progress Notes (Signed)
? ? ? ?Chief Complaint:  ? ?OBESITY ?Brandi Lester is here to discuss her progress with her obesity treatment plan along with follow-up of her obesity related diagnoses. Brandi Lester is on the Category 4 Plan and states she is following her eating plan approximately 75% of the time. Brandi Lester states she is walking and riding a bike for 45 minutes 3-4 times per week. ? ?Today's visit was #: 57 ?Starting weight: 325 lbs ?Starting date: 05/23/2017 ?Today's weight: 283 lbs ?Today's date: 11/20/2021 ?Total lbs lost to date: 42 lbs ?Total lbs lost since last in-office visit: 0 ? ?Interim History: Brandi Lester is a [place]. She drinks a protein shake for breakfast and is eating microwave meals for lunch. She prefers journaling and would like to go back to journaling.  ? ?Subjective:  ? ?1. Type 2 diabetes mellitus with other specified complication, without long-term current use of insulin (Brandi Lester) ?Brandi Lester's last A1C was 5.5. She does not check her blood sugar at home. She is on [name] 12.5 mg weekly and Metformin 500 mg twice daily.  ? ?2. Essential hypertension ?Brandi Lester sees a cardiologist. She has white coat syndrome. She is taking her medications as prescribed.  ? ?3. At risk for heart disease ?Brandi Lester is at higher than average risk for cardiovascular disease due to obesity.  ? ?Assessment/Plan:  ? ?1. Type 2 diabetes mellitus with other specified complication, without long-term current use of insulin ([place]) ?We will refill [name] 12.5 mg for 1 month with no refills. Good blood sugar control is important to decrease the likelihood of diabetic complications such as nephropathy, neuropathy, limb loss, blindness, coronary artery disease, and death. Intensive lifestyle modification including diet, exercise and weight loss are the first line of treatment for diabetes.  ? ?- tirzepatide ([name]) 12.5 MG/0.5ML Pen; Inject 12.5 mg into the skin once a week.  Dispense: 2 mL; Refill: 0 ? ?2. Essential hypertension ?Brandi Lester will follow  up with cardiologist. We will continue to monitor her blood pressure. She is working on healthy weight loss and exercise to improve blood pressure control. We will watch for signs of hypotension as she continues her lifestyle modifications. ? ?3. At risk for heart disease ?Brandi Lester was given approximately 15 minutes of coronary artery disease prevention counseling today. She is 56 y.o. female and has risk factors for heart disease including obesity. We discussed intensive lifestyle modifications today with an emphasis on specific weight loss instructions and strategies. ? ?Repetitive spaced learning was employed today to elicit superior memory formation and behavioral change.   ? ?4. Obesity: BMI 40.18 ?Brandi Lester is currently in the action stage of change. As such, her goal is to continue with weight loss efforts. She has agreed to keeping a food journal and adhering to recommended goals of 1700-1800 calories and 115 grams of protein daily.  ? ?Exercise goals:  As is. ? ?Behavioral modification strategies: increasing lean protein intake, meal planning and cooking strategies, and keeping a strict food journal. ? ?Brandi Lester has agreed to follow-up with our clinic in 3 weeks. She was informed of the importance of frequent follow-up visits to maximize her success with intensive lifestyle modifications for her multiple health conditions.  ? ?Objective:  ? ?Blood pressure 139/67, pulse 65, temperature 99.4 ?F (37.4 ?C), height '5\' 10"'$  (1.778 m), weight 283 lb (128.4 kg), SpO2 98 %. ?Body mass index is 40.61 kg/m?. ? ?General: Cooperative, alert, well developed, in no acute distress. ?HEENT: Conjunctivae and lids unremarkable. ?Cardiovascular: Regular rhythm.  ?Lungs: Normal work of breathing. ?  Neurologic: No focal deficits.  ? ?Lab Results  ?Component Value Date  ? CREATININE 0.63 06/06/2021  ? BUN 8 06/06/2021  ? NA 139 06/06/2021  ? K 3.5 06/06/2021  ? CL 101 06/06/2021  ? CO2 28 06/06/2021  ? ?Lab Results  ?Component Value  Date  ? ALT 9 06/06/2021  ? AST 10 06/06/2021  ? ALKPHOS 77 06/06/2021  ? BILITOT 0.5 06/06/2021  ? ?Lab Results  ?Component Value Date  ? HGBA1C 5.5 06/06/2021  ? HGBA1C 5.6 11/28/2020  ? HGBA1C 6.1 03/21/2020  ? HGBA1C 5.9 (H) 12/01/2019  ? HGBA1C 5.9 (H) 07/07/2019  ? ?Lab Results  ?Component Value Date  ? INSULIN 20.7 12/01/2019  ? INSULIN 12.9 07/07/2019  ? INSULIN 21.2 11/20/2018  ? INSULIN 15.4 07/14/2018  ? INSULIN 12.9 09/12/2017  ? ?Lab Results  ?Component Value Date  ? TSH 1.32 06/06/2021  ? ?Lab Results  ?Component Value Date  ? CHOL 111 06/06/2021  ? HDL 33.70 (L) 06/06/2021  ? [place] 58 06/06/2021  ? TRIG 99.0 06/06/2021  ? CHOLHDL 3 06/06/2021  ? ?Lab Results  ?Component Value Date  ? VD25OH 78.96 06/06/2021  ? VD25OH 78 11/28/2020  ? VD25OH 56.44 03/21/2020  ? ?Lab Results  ?Component Value Date  ? WBC 6.3 06/06/2021  ? HGB 12.4 06/06/2021  ? HCT 36.9 06/06/2021  ? MCV 86.3 06/06/2021  ? PLT 303.0 06/06/2021  ? ?No results found for: IRON, TIBC, FERRITIN ? ?Attestation Statements:  ? ?Reviewed by clinician on day of visit: allergies, medications, problem list, medical history, surgical history, family history, social history, and previous encounter notes. ? ?I, Tonye Pearson, am acting as Location manager for [employer], PA-C. ? ?I have reviewed the above documentation for accuracy and completeness, and I agree with the above. Abby Potash, PA-C ? ?

## 2021-11-26 ENCOUNTER — Other Ambulatory Visit: Payer: Self-pay | Admitting: Family Medicine

## 2021-11-26 DIAGNOSIS — E1159 Type 2 diabetes mellitus with other circulatory complications: Secondary | ICD-10-CM

## 2021-11-27 ENCOUNTER — Other Ambulatory Visit (HOSPITAL_BASED_OUTPATIENT_CLINIC_OR_DEPARTMENT_OTHER): Payer: Self-pay

## 2021-11-27 MED ORDER — HYDROCHLOROTHIAZIDE 25 MG PO TABS
25.0000 mg | ORAL_TABLET | Freq: Every day | ORAL | 1 refills | Status: DC
  Filled 2021-11-27: qty 90, 90d supply, fill #0
  Filled 2022-02-21: qty 90, 90d supply, fill #1

## 2021-11-27 MED ORDER — AMLODIPINE BESYLATE 10 MG PO TABS
10.0000 mg | ORAL_TABLET | Freq: Every day | ORAL | 1 refills | Status: DC
  Filled 2021-11-27: qty 90, 90d supply, fill #0
  Filled 2022-02-21: qty 90, 90d supply, fill #1

## 2021-12-01 ENCOUNTER — Other Ambulatory Visit (HOSPITAL_BASED_OUTPATIENT_CLINIC_OR_DEPARTMENT_OTHER): Payer: Self-pay

## 2021-12-01 ENCOUNTER — Other Ambulatory Visit: Payer: Self-pay | Admitting: Family Medicine

## 2021-12-01 DIAGNOSIS — E1159 Type 2 diabetes mellitus with other circulatory complications: Secondary | ICD-10-CM

## 2021-12-01 MED ORDER — LOSARTAN POTASSIUM 100 MG PO TABS
100.0000 mg | ORAL_TABLET | Freq: Every day | ORAL | 1 refills | Status: DC
  Filled 2021-12-01: qty 90, 90d supply, fill #0
  Filled 2022-02-21: qty 90, 90d supply, fill #1

## 2021-12-12 ENCOUNTER — Encounter (INDEPENDENT_AMBULATORY_CARE_PROVIDER_SITE_OTHER): Payer: Self-pay | Admitting: Physician Assistant

## 2021-12-12 ENCOUNTER — Other Ambulatory Visit (HOSPITAL_BASED_OUTPATIENT_CLINIC_OR_DEPARTMENT_OTHER): Payer: Self-pay

## 2021-12-12 ENCOUNTER — Ambulatory Visit (INDEPENDENT_AMBULATORY_CARE_PROVIDER_SITE_OTHER): Payer: BC Managed Care – PPO | Admitting: Physician Assistant

## 2021-12-12 VITALS — BP 111/70 | HR 83 | Temp 98.9°F | Ht 70.0 in | Wt 281.0 lb

## 2021-12-12 DIAGNOSIS — E669 Obesity, unspecified: Secondary | ICD-10-CM | POA: Diagnosis not present

## 2021-12-12 DIAGNOSIS — E1169 Type 2 diabetes mellitus with other specified complication: Secondary | ICD-10-CM

## 2021-12-12 DIAGNOSIS — Z6841 Body Mass Index (BMI) 40.0 and over, adult: Secondary | ICD-10-CM

## 2021-12-12 DIAGNOSIS — Z7985 Long-term (current) use of injectable non-insulin antidiabetic drugs: Secondary | ICD-10-CM

## 2021-12-12 DIAGNOSIS — Z9189 Other specified personal risk factors, not elsewhere classified: Secondary | ICD-10-CM

## 2021-12-12 DIAGNOSIS — F3289 Other specified depressive episodes: Secondary | ICD-10-CM | POA: Diagnosis not present

## 2021-12-12 DIAGNOSIS — E66813 Obesity, class 3: Secondary | ICD-10-CM

## 2021-12-12 MED ORDER — TIRZEPATIDE 12.5 MG/0.5ML ~~LOC~~ SOAJ
12.5000 mg | SUBCUTANEOUS | 0 refills | Status: DC
  Filled 2021-12-12: qty 2, 28d supply, fill #0

## 2021-12-12 MED ORDER — BUPROPION HCL ER (SR) 200 MG PO TB12
200.0000 mg | ORAL_TABLET | Freq: Every morning | ORAL | 0 refills | Status: DC
  Filled 2021-12-12: qty 90, 90d supply, fill #0

## 2021-12-13 NOTE — Progress Notes (Signed)
? ?Subjective:  ? ? Patient ID: Brandi Lester, female    DOB: 07-03-1966, 56 y.o.   MRN: [id number] ? ?Chief Complaint  ?Patient presents with  ? Annual Exam  ? ? ?HPI ?Patient is in today for her annual physical exam and overall she is feeling well. 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 No recent febrile illness or hospitalizations. No complaints of polyuria or polydipsia. Is trying to maintain a heart healthy diet and to stay active. Denies CP/palp/SOB/HA/congestion/fevers/GI or GU c/o. Taking meds as prescribed  ? ?Past Medical History:  ?Diagnosis Date  ? ADD (attention deficit disorder)   ? Allergy   ? allergic rhinitis  ? Anemia 07/25/2013  ? Angio-edema   ? Anxiety   ? Arthritis   ? not dx'd  ? Back pain   ? Costochondritis 02/20/2015  ? Diabetes mellitus   ? Type II   previously 3 years ago - no longer on meds   ? Dyslipidemia 07/25/2013  ? Fungus infection 10/13  ? Left great toe  ? GERD (gastroesophageal reflux disease)   ? Hypertension   ? IBS (irritable bowel syndrome)   ? Ingrown nail 10/13  ? right foot next to the last toe  ? Insomnia 04/20/2013  ? Joint pain   ? Lactose intolerance   ? Leg edema   ? Obesity   ? Pedal edema 02/20/2015  ? Prediabetes   ? Preventative health care 09/25/2015  ? PVC (premature ventricular contraction)   ? ? ?Past Surgical History:  ?Procedure Laterality Date  ? HYSTEROSCOPY N/A 10/04/2015  ? Procedure: HYSTEROSCOPY with Removal IUD;  Surgeon: Vanessa Kick, MD;  Location: [facility];  Service: Gynecology;  Laterality: N/A;  ? lasik    ? eye surgery  ? WISDOM TOOTH EXTRACTION    ? ? ?Family History  ?Problem Relation Age of Onset  ? Heart attack Mother 16  ? Hypertension Mother   ? Heart disease Mother   ? Obesity Mother   ? Cancer Father   ?     prostate  ? Prostate cancer Father   ? Heart attack Sister 79  ? Hypertension Other   ? Colon cancer Neg Hx   ? Colon polyps Neg Hx   ? Esophageal cancer Neg Hx   ? Stomach cancer Neg Hx   ? Rectal cancer Neg Hx    ? ? ?Social History  ? ?Socioeconomic History  ? Marital status: Single  ?  Spouse name: Not on file  ? Number of children: Not on file  ? Years of education: Not on file  ? Highest education level: Not on file  ?Occupational History  ? Occupation: Pharmacist, hospital, [employer]  ?Tobacco Use  ? Smoking status: Former  ?  Packs/day: 1.00  ?  Years: 15.00  ?  Pack years: 15.00  ?  Types: Cigarettes  ?  Quit date: 09/03/2001  ?  Years since quitting: 20.2  ? Smokeless tobacco: Never  ?Vaping Use  ? Vaping Use: Never used  ?Substance and Sexual Activity  ? Alcohol use: Yes  ?  Comment: rare  ? Drug use: No  ? Sexual activity: Yes  ?  Birth control/protection: None  ?Other Topics Concern  ? Not on file  ?Social History Narrative  ? Not on file  ? ?Social Determinants of Health  ? ?Financial Resource Strain: Not on file  ?Food Insecurity: Not on file  ?Transportation Needs: Not on file  ?Physical Activity:  Not on file  ?Stress: Not on file  ?Social Connections: Not on file  ?Intimate Partner Violence: Not on file  ? ? ?Outpatient Medications Prior to Visit  ?Medication Sig Dispense Refill  ? acetaminophen (TYLENOL 8 HOUR) 650 MG CR tablet Take 1 tablet (650 mg total) every 8 (eight) hours as needed by mouth for pain.    ? amLODipine (NORVASC) 10 MG tablet Take 1 tablet (10 mg total) by mouth daily. 90 tablet 1  ? atorvastatin (LIPITOR) 10 MG tablet TAKE 1 TABLET BY MOUTH ONCE DAILY 90 tablet 0  ? azelastine (ASTELIN) 0.1 % nasal spray Place 2 sprays into both nostrils 2 (two) times daily. 30 mL 2  ? buPROPion (WELLBUTRIN SR) 200 MG 12 hr tablet Take 1 tablet (200 mg total) by mouth in the morning. 90 tablet 0  ? carvedilol (COREG) 25 MG tablet Take 1 tablet (25 mg total) by mouth 2 (two) times daily. 180 tablet 3  ? cholecalciferol (VITAMIN D3) 25 MCG (1000 UT) tablet Take 4,000 Units by mouth daily.    ? COVID-19 mRNA bivalent vaccine, Moderna, (MODERNA COVID-19 BIVAL BOOSTER) 50 MCG/0.5ML injection Inject into the muscle. 0.5 mL  0  ? COVID-19 mRNA Vac-TriS, Pfizer, SUSP injection Inject into the muscle. 0.3 mL 0  ? EPINEPHrine (EPIPEN 2-PAK) 0.3 mg/0.3 mL IJ SOAJ injection Use as directed for severe allergic reaction 2 each 1  ? fluticasone (FLONASE) 50 MCG/ACT nasal spray Place 2 sprays into both nostrils daily as needed for allergies. 16 g 2  ? furosemide (LASIX) 20 MG tablet Take 1 tablet (20 mg total) by mouth daily as needed for edema (weight gain of >3 lbs/24 hours). 30 tablet 3  ? hydrochlorothiazide (HYDRODIURIL) 25 MG tablet Take 1 tablet (25 mg total) by mouth daily. 90 tablet 1  ? ibuprofen (ADVIL,MOTRIN) 600 MG tablet Take 1 tablet (600 mg total) by mouth every 6 (six) hours as needed. 90 tablet 0  ? losartan (COZAAR) 100 MG tablet Take 1 tablet (100 mg total) by mouth daily. 90 tablet 1  ? medroxyPROGESTERone (PROVERA) 10 MG tablet TAKE 1 TABLET (10 MG TOTAL) BY MOUTH DAILY. 90 tablet 3  ? metFORMIN (GLUCOPHAGE) 500 MG tablet Take 1 tablet (500 mg total) by mouth 2 (two) times daily with a meal. 180 tablet 0  ? montelukast (SINGULAIR) 10 MG tablet Take 1 tablet (10 mg total) by mouth at bedtime as needed. 90 tablet 1  ? Omega-3 Fatty Acids (FISH OIL PO) Take 1 capsule by mouth daily.    ? pantoprazole (PROTONIX) 40 MG tablet TAKE 1 TABLET BY MOUTH ONCE DAILY 90 tablet 1  ? potassium chloride SA (KLOR-CON M) 20 MEQ tablet TAKE 1 TABLET (20 MEQ TOTAL) BY MOUTH 3 (THREE) TIMES DAILY. 270 tablet 1  ? Probiotic Product (PROBIOTIC-10 PO) Take by mouth as needed.     ? tirzepatide ([name]) 12.5 MG/0.5ML Pen Inject 12.5 mg into the skin once a week. 2 mL 0  ? TURMERIC PO Take 1 capsule by mouth daily.    ? ?No facility-administered medications prior to visit.  ? ? ?Allergies  ?Allergen Reactions  ? Penicillins Itching and Swelling  ?  Has patient had a PCN reaction causing immediate rash, facial/tongue/throat swelling, SOB or lightheadedness with hypotension: yes ?Has patient had a PCN reaction causing severe rash involving mucus  membranes or skin necrosis: no ?Has patient had a PCN reaction that required hospitalization no ?Has patient had a PCN reaction occurring within the  last 10 years: yes ?If all of the above answers are "NO", then may proceed with Cephalosporin use. ?  ? Victoza [Liraglutide]   ?  Vomiting  ? ? ?Review of Systems  ?Constitutional:  Negative for chills, fever and malaise/fatigue.  ?HENT:  Negative for congestion and hearing loss.   ?Eyes:  Negative for discharge.  ?Respiratory:  Negative for cough, sputum production and shortness of breath.   ?Cardiovascular:  Negative for chest pain, palpitations and leg swelling.  ?Gastrointestinal:  Negative for abdominal pain, blood in stool, constipation, diarrhea, heartburn, nausea and vomiting.  ?Genitourinary:  Negative for dysuria, frequency, hematuria and urgency.  ?Musculoskeletal:  Negative for back pain, falls and myalgias.  ?Skin:  Negative for rash.  ?Neurological:  Negative for dizziness, sensory change, loss of consciousness, weakness and headaches.  ?Endo/Heme/Allergies:  Negative for environmental allergies. Does not bruise/bleed easily.  ?Psychiatric/Behavioral:  Negative for depression and suicidal ideas. The patient is not nervous/anxious and does not have insomnia.   ? ?   ?Objective:  ?  ?Physical Exam ?Constitutional:   ?   General: She is not in acute distress. ?   Appearance: She is well-developed.  ?HENT:  ?   Head: Normocephalic and atraumatic.  ?Eyes:  ?   Conjunctiva/sclera: Conjunctivae normal.  ?Neck:  ?   Thyroid: No thyromegaly.  ?Cardiovascular:  ?   Rate and Rhythm: Normal rate and regular rhythm.  ?   Heart sounds: Normal heart sounds. No murmur heard. ?Pulmonary:  ?   Effort: Pulmonary effort is normal. No respiratory distress.  ?   Breath sounds: Normal breath sounds.  ?Abdominal:  ?   General: Bowel sounds are normal. There is no distension.  ?   Palpations: Abdomen is soft. There is no mass.  ?   Tenderness: There is no abdominal tenderness.   ?Musculoskeletal:  ?   Cervical back: Neck supple.  ?Lymphadenopathy:  ?   Cervical: No cervical adenopathy.  ?Skin: ?   General: Skin is warm and dry.  ?Neurological:  ?   Mental Status: She is alert and oriented

## 2021-12-14 ENCOUNTER — Encounter: Payer: Self-pay | Admitting: Family Medicine

## 2021-12-14 ENCOUNTER — Ambulatory Visit (INDEPENDENT_AMBULATORY_CARE_PROVIDER_SITE_OTHER): Payer: BC Managed Care – PPO | Admitting: Family Medicine

## 2021-12-14 VITALS — BP 114/70 | HR 87 | Resp 20 | Ht 70.0 in | Wt 289.2 lb

## 2021-12-14 DIAGNOSIS — I152 Hypertension secondary to endocrine disorders: Secondary | ICD-10-CM

## 2021-12-14 DIAGNOSIS — E1169 Type 2 diabetes mellitus with other specified complication: Secondary | ICD-10-CM

## 2021-12-14 DIAGNOSIS — R002 Palpitations: Secondary | ICD-10-CM

## 2021-12-14 DIAGNOSIS — E669 Obesity, unspecified: Secondary | ICD-10-CM | POA: Diagnosis not present

## 2021-12-14 DIAGNOSIS — Z0000 Encounter for general adult medical examination without abnormal findings: Secondary | ICD-10-CM | POA: Diagnosis not present

## 2021-12-14 DIAGNOSIS — E559 Vitamin D deficiency, unspecified: Secondary | ICD-10-CM | POA: Diagnosis not present

## 2021-12-14 DIAGNOSIS — E785 Hyperlipidemia, unspecified: Secondary | ICD-10-CM | POA: Diagnosis not present

## 2021-12-14 DIAGNOSIS — E1159 Type 2 diabetes mellitus with other circulatory complications: Secondary | ICD-10-CM

## 2021-12-14 DIAGNOSIS — E782 Mixed hyperlipidemia: Secondary | ICD-10-CM

## 2021-12-14 NOTE — Assessment & Plan Note (Signed)
Supplement and monitor 

## 2021-12-14 NOTE — Patient Instructions (Signed)

## 2021-12-14 NOTE — Assessment & Plan Note (Signed)
hgba1c acceptable, minimize simple carbs. Increase exercise as tolerated. Continue current meds 

## 2021-12-14 NOTE — Assessment & Plan Note (Signed)
Well controlled, no changes to meds. Encouraged heart healthy diet such as the DASH diet and exercise as tolerated.  °

## 2021-12-14 NOTE — Progress Notes (Signed)
? ? ? ?Chief Complaint:  ? ?OBESITY ?Brandi Lester is here to discuss her progress with her obesity treatment plan along with follow-up of her obesity related diagnoses. Brandi Lester is on the Category 4 Plan and keeping a food journal and adhering to recommended goals of 1700-1800 calories and 115 grams of protein protein and states she is following her eating plan approximately 75% of the time. Brandi Lester states she is using the stationary bike and walking for 45 minutes 4-5 times per week. ? ?Today's visit was #: 63 ?Starting weight: 325 lbs ?Starting date: 05/23/2017 ?Today's weight: 281 lbs ?Today's date: 12/12/2021 ?Total lbs lost to date: 44 lbs ?Total lbs lost since last in-office visit: 2 lb ? ?Interim History: Brandi Lester is trying to increase her exercise. She is struggling to get in all of her food since increasing [name] to 12.5 mg.  ? ?Subjective:  ? ?1. Type 2 diabetes mellitus with other specified complication, without long-term current use of insulin (Brandi Lester) ?Brandi Lester is currently on [name] 12.5. She is struggling to eat all of her food but 10 mg does not decrease her appetite. Her last A1C was 5.5. ? ?2. Other depression, with emotional eating ?Brandi Lester states Wellbutrin helps with mood and cravings.  ? ?3. At risk for impaired metabolic function ?Brandi Lester is at risk for impaired metabolic function due to not eating enough. ? ?Assessment/Plan:  ? ?1. Type 2 diabetes mellitus with other specified complication, without long-term current use of insulin (Brandi Lester) ?We will refill [name] 12.5 mg. Good blood sugar control is important to decrease the likelihood of diabetic complications such as nephropathy, neuropathy, limb loss, blindness, coronary artery disease, and death. Intensive lifestyle modification including diet, exercise and weight loss are the first line of treatment for diabetes.  ? ?- tirzepatide ([name]) 12.5 MG/0.5ML Pen; Inject 12.5 mg into the skin once a week.  Dispense: 2 mL; Refill: 0 ? ?2. Other  depression, with emotional eating ?We will refill Wellbutrin SR 200 mg for 1 month with no refills. Behavior modification techniques were discussed today to help Brandi Lester deal with her emotional/non-hunger eating behaviors.  Orders and follow up as documented in patient record.  ? ?- buPROPion (WELLBUTRIN SR) 200 MG 12 hr tablet; Take 1 tablet (200 mg total) by mouth in the morning.  Dispense: 90 tablet; Refill: 0 ? ?3. At risk for impaired metabolic function ?Brandi Lester was given approximately 15 minutes of impaired  metabolic function prevention counseling today. We discussed intensive lifestyle modifications today with an emphasis on specific nutrition and exercise instructions and strategies.  ? ?Repetitive spaced learning was employed today to elicit superior memory formation and behavioral change.  ? ?4. Obesity: BMI 40.32 ?Brandi Lester is currently in the action stage of change. As such, her goal is to continue with weight loss efforts. She has agreed to keeping a food journal and adhering to recommended goals of 1700-1800 calories and 115 grams of protein daily.  ? ?Exercise goals:  As is.  ? ?Behavioral modification strategies: meal planning and cooking strategies and keeping healthy foods in the home. ? ?Brandi Lester has agreed to follow-up with our clinic in 4 weeks. She was informed of the importance of frequent follow-up visits to maximize her success with intensive lifestyle modifications for her multiple health conditions.  ? ?Objective:  ? ?Blood pressure 111/70, pulse 83, temperature 98.9 ?F (37.2 ?C), height '5\' 10"'$  (1.778 m), weight 281 lb (127.5 kg), SpO2 99 %. ?Body mass index is 40.32 kg/m?. ? ?General: Cooperative, alert, well developed, in  no acute distress. ?HEENT: Conjunctivae and lids unremarkable. ?Cardiovascular: Regular rhythm.  ?Lungs: Normal work of breathing. ?Neurologic: No focal deficits.  ? ?Lab Results  ?Component Value Date  ? CREATININE 0.63 06/06/2021  ? BUN 8 06/06/2021  ? NA 139 06/06/2021   ? K 3.5 06/06/2021  ? CL 101 06/06/2021  ? CO2 28 06/06/2021  ? ?Lab Results  ?Component Value Date  ? ALT 9 06/06/2021  ? AST 10 06/06/2021  ? ALKPHOS 77 06/06/2021  ? BILITOT 0.5 06/06/2021  ? ?Lab Results  ?Component Value Date  ? HGBA1C 5.5 06/06/2021  ? HGBA1C 5.6 11/28/2020  ? HGBA1C 6.1 03/21/2020  ? HGBA1C 5.9 (H) 12/01/2019  ? HGBA1C 5.9 (H) 07/07/2019  ? ?Lab Results  ?Component Value Date  ? INSULIN 20.7 12/01/2019  ? INSULIN 12.9 07/07/2019  ? INSULIN 21.2 11/20/2018  ? INSULIN 15.4 07/14/2018  ? INSULIN 12.9 09/12/2017  ? ?Lab Results  ?Component Value Date  ? TSH 1.32 06/06/2021  ? ?Lab Results  ?Component Value Date  ? CHOL 111 06/06/2021  ? HDL 33.70 (L) 06/06/2021  ? [place] 58 06/06/2021  ? TRIG 99.0 06/06/2021  ? CHOLHDL 3 06/06/2021  ? ?Lab Results  ?Component Value Date  ? VD25OH 78.96 06/06/2021  ? VD25OH 78 11/28/2020  ? VD25OH 56.44 03/21/2020  ? ?Lab Results  ?Component Value Date  ? WBC 6.3 06/06/2021  ? HGB 12.4 06/06/2021  ? HCT 36.9 06/06/2021  ? MCV 86.3 06/06/2021  ? PLT 303.0 06/06/2021  ? ?No results found for: IRON, TIBC, FERRITIN ? ?Attestation Statements:  ? ?Reviewed by clinician on day of visit: allergies, medications, problem list, medical history, surgical history, family history, social history, and previous encounter notes. ? ?I, Tonye Pearson, am acting as Location manager for [employer], PA-C. ? ?I have reviewed the above documentation for accuracy and completeness, and I agree with the above. Abby Potash, PA-C ? ?

## 2021-12-14 NOTE — Assessment & Plan Note (Signed)
Does best when she avoids caffeine ?

## 2021-12-14 NOTE — Assessment & Plan Note (Signed)
Encourage heart healthy diet such as MIND or DASH diet, increase exercise, avoid trans fats, simple carbohydrates and processed foods, consider a krill or fish or flaxseed oil cap daily. Tolerating Atorvastatin 

## 2021-12-14 NOTE — Assessment & Plan Note (Signed)
Patient encouraged to maintain heart healthy diet, regular exercise, adequate sleep. Consider daily probiotics. Take medications as prescribed. Labs ordered and reviewed. Follows with OB for paps and MGMs.  Colonoscopy was 2021 repeat in 10 years ?

## 2021-12-14 NOTE — Assessment & Plan Note (Signed)
Encouraged DASH or MIND diet, decrease po intake and increase exercise as tolerated. Needs 7-8 hours of sleep nightly. Avoid trans fats, eat small, frequent meals every 4-5 hours with lean proteins, complex carbs and healthy fats. Minimize simple carbs, high fat foods and processed foods. Following with healthy weight and wellness. Labs checked for them today ?

## 2021-12-15 LAB — COMPREHENSIVE METABOLIC PANEL
ALT: 10 U/L (ref 0–35)
AST: 10 U/L (ref 0–37)
Albumin: 4.2 g/dL (ref 3.5–5.2)
Alkaline Phosphatase: 84 U/L (ref 39–117)
BUN: 11 mg/dL (ref 6–23)
CO2: 27 mEq/L (ref 19–32)
Calcium: 9.5 mg/dL (ref 8.4–10.5)
Chloride: 103 mEq/L (ref 96–112)
Creatinine, Ser: 0.78 mg/dL (ref 0.40–1.20)
GFR: 86.61 mL/min (ref >60.00)
Glucose, Bld: 70 mg/dL (ref 70–99)
Potassium: 3.6 mEq/L (ref 3.5–5.1)
Sodium: 138 mEq/L (ref 135–145)
Total Bilirubin: 0.3 mg/dL (ref 0.2–1.2)
Total Protein: 7 g/dL (ref 6.0–8.3)

## 2021-12-15 LAB — LIPID PANEL
Cholesterol: 111 mg/dL (ref 0–200)
HDL: 33.3 mg/dL — ABNORMAL LOW (ref >39.00)
LDL Cholesterol: 60 mg/dL (ref 0–99)
NonHDL: 77.23
Total CHOL/HDL Ratio: 3
Triglycerides: 88 mg/dL (ref 0.0–149.0)
VLDL: 17.6 mg/dL (ref 0.0–40.0)

## 2021-12-15 LAB — CBC
HCT: 37.3 % (ref 36.0–46.0)
Hemoglobin: 12.6 g/dL (ref 12.0–15.0)
MCHC: 33.7 g/dL (ref 30.0–36.0)
MCV: 83.8 fl (ref 78.0–100.0)
Platelets: 300 10*3/uL (ref 150.0–400.0)
RBC: 4.45 Mil/uL (ref 3.87–5.11)
RDW: 14.8 % (ref 11.5–15.5)
WBC: 6.6 10*3/uL (ref 4.0–10.5)

## 2021-12-15 LAB — MAGNESIUM: Magnesium: 1.8 mg/dL (ref 1.5–2.5)

## 2021-12-15 LAB — INSULIN, RANDOM: Insulin: 12.5 u[IU]/mL

## 2021-12-15 LAB — HEMOGLOBIN A1C: Hgb A1c MFr Bld: 5.8 % (ref 4.6–6.5)

## 2021-12-15 LAB — VITAMIN D 25 HYDROXY (VIT D DEFICIENCY, FRACTURES): VITD: 90.29 ng/mL (ref 30.00–100.00)

## 2021-12-15 LAB — TSH: TSH: 1.19 u[IU]/mL (ref 0.35–5.50)

## 2022-01-08 ENCOUNTER — Other Ambulatory Visit (HOSPITAL_BASED_OUTPATIENT_CLINIC_OR_DEPARTMENT_OTHER): Payer: Self-pay

## 2022-01-08 ENCOUNTER — Encounter (INDEPENDENT_AMBULATORY_CARE_PROVIDER_SITE_OTHER): Payer: Self-pay | Admitting: Adult Health

## 2022-01-08 ENCOUNTER — Ambulatory Visit (INDEPENDENT_AMBULATORY_CARE_PROVIDER_SITE_OTHER): Payer: BC Managed Care – PPO | Admitting: Adult Health

## 2022-01-08 ENCOUNTER — Other Ambulatory Visit: Payer: Self-pay | Admitting: Family Medicine

## 2022-01-08 VITALS — BP 104/69 | HR 83 | Temp 98.7°F | Ht 70.0 in | Wt 281.0 lb

## 2022-01-08 DIAGNOSIS — Z7985 Long-term (current) use of injectable non-insulin antidiabetic drugs: Secondary | ICD-10-CM

## 2022-01-08 DIAGNOSIS — I10 Essential (primary) hypertension: Secondary | ICD-10-CM

## 2022-01-08 DIAGNOSIS — E785 Hyperlipidemia, unspecified: Secondary | ICD-10-CM | POA: Diagnosis not present

## 2022-01-08 DIAGNOSIS — E1169 Type 2 diabetes mellitus with other specified complication: Secondary | ICD-10-CM

## 2022-01-08 DIAGNOSIS — Z9189 Other specified personal risk factors, not elsewhere classified: Secondary | ICD-10-CM

## 2022-01-08 DIAGNOSIS — E876 Hypokalemia: Secondary | ICD-10-CM

## 2022-01-08 DIAGNOSIS — T7840XA Allergy, unspecified, initial encounter: Secondary | ICD-10-CM

## 2022-01-08 DIAGNOSIS — E559 Vitamin D deficiency, unspecified: Secondary | ICD-10-CM | POA: Diagnosis not present

## 2022-01-08 DIAGNOSIS — E669 Obesity, unspecified: Secondary | ICD-10-CM

## 2022-01-08 DIAGNOSIS — Z6841 Body Mass Index (BMI) 40.0 and over, adult: Secondary | ICD-10-CM

## 2022-01-08 MED ORDER — TIRZEPATIDE 10 MG/0.5ML ~~LOC~~ SOAJ
10.0000 mg | SUBCUTANEOUS | 0 refills | Status: DC
  Filled 2022-01-08: qty 2, 28d supply, fill #0

## 2022-01-08 MED ORDER — ATORVASTATIN CALCIUM 10 MG PO TABS
ORAL_TABLET | Freq: Every day | ORAL | 0 refills | Status: DC
  Filled 2022-01-08: qty 90, 90d supply, fill #0

## 2022-01-09 ENCOUNTER — Other Ambulatory Visit (HOSPITAL_BASED_OUTPATIENT_CLINIC_OR_DEPARTMENT_OTHER): Payer: Self-pay

## 2022-01-09 MED ORDER — POTASSIUM CHLORIDE CRYS ER 20 MEQ PO TBCR
EXTENDED_RELEASE_TABLET | ORAL | 1 refills | Status: DC
  Filled 2022-01-09: qty 270, 90d supply, fill #0
  Filled 2022-04-07: qty 270, 90d supply, fill #1

## 2022-01-10 ENCOUNTER — Ambulatory Visit (INDEPENDENT_AMBULATORY_CARE_PROVIDER_SITE_OTHER): Payer: BC Managed Care – PPO | Admitting: Physician Assistant

## 2022-01-11 ENCOUNTER — Encounter: Payer: Self-pay | Admitting: Allergy & Immunology

## 2022-01-11 ENCOUNTER — Ambulatory Visit: Payer: BC Managed Care – PPO | Admitting: Allergy & Immunology

## 2022-01-11 ENCOUNTER — Other Ambulatory Visit (HOSPITAL_BASED_OUTPATIENT_CLINIC_OR_DEPARTMENT_OTHER): Payer: Self-pay

## 2022-01-11 VITALS — BP 128/60 | HR 97 | Temp 98.0°F | Resp 16 | Ht 68.0 in | Wt 291.8 lb

## 2022-01-11 DIAGNOSIS — J302 Other seasonal allergic rhinitis: Secondary | ICD-10-CM

## 2022-01-11 DIAGNOSIS — J3089 Other allergic rhinitis: Secondary | ICD-10-CM

## 2022-01-11 DIAGNOSIS — J329 Chronic sinusitis, unspecified: Secondary | ICD-10-CM

## 2022-01-11 DIAGNOSIS — J310 Chronic rhinitis: Secondary | ICD-10-CM

## 2022-01-11 DIAGNOSIS — K219 Gastro-esophageal reflux disease without esophagitis: Secondary | ICD-10-CM

## 2022-01-11 MED ORDER — EPINEPHRINE 0.3 MG/0.3ML IJ SOAJ
INTRAMUSCULAR | 1 refills | Status: AC
Start: 2022-01-11 — End: ?
  Filled 2022-01-11: qty 2, 2d supply, fill #0

## 2022-01-11 NOTE — Patient Instructions (Addendum)
1. Chronic rhinitis ?- Testing today showed: grasses, ragweed, indoor molds, dust mites, dog, and cockroach. ?- Copy of test results provided.  ?- Avoidance measures provided. ?- Continue with: Zyrtec (cetirizine) '10mg'$  tablet once daily, Singulair (montelukast) '10mg'$  daily, Flonase (fluticasone) one spray per nostril daily (AIM FOR EAR ON EACH SIDE), and Astelin (azelastine) 2 sprays per nostril 1-2 times daily as needed ?- You can use an extra dose of the antihistamine, if needed, for breakthrough symptoms.  ?- Consider nasal saline rinses 1-2 times daily to remove allergens from the nasal cavities as well as help with mucous clearance (this is especially helpful to do before the nasal sprays are given) ?- We will plan to start allergy shots as a means of long-term control. ?- Allergy shots "re-train" and "reset" the immune system to ignore environmental allergens and decrease the resulting immune response to those allergens (sneezing, itchy watery eyes, runny nose, nasal congestion, etc).    ?- Allergy shots improve symptoms in 75-85% of patients.  ?- Information provided on RUSH immunotherapy as well, which will remove a few months of visits to get you to the top dose sooner. ?- Codes provided for [facility] immunotherapy. ?- Someone will talk to you about this from the [facility] office.  ? ?2. Return in about 3 months (around 04/13/2022).  ? ? ?Please inform us of any Emergency Department visits, hospitalizations, or changes in symptoms. Call us before going to the ED for breathing or allergy symptoms since we might be able to fit you in for a sick visit. Feel free to contact us anytime with any questions, problems, or concerns. ? ?It was a pleasure to meet you today! ? ?Websites that have reliable patient information: ?1. American Academy of Asthma, Allergy, and Immunology: www.aaaai.org ?2. Food Allergy Research and Education (FARE): foodallergy.org ?3. Mothers of Asthmatics: http://www.asthmacommunitynetwork.org ?4.  [employer] of Allergy, Asthma, and Immunology: MonthlyElectricBill.co.uk ? ? ?COVID-19 Vaccine Information can be found at: ShippingScam.co.uk For questions related to vaccine distribution or appointments, please email [email or web address] or call [phone].  ? ?We realize that you might be concerned about having an allergic reaction to the COVID19 vaccines. To help with that concern, WE ARE OFFERING THE COVID19 VACCINES IN OUR OFFICE! Ask the front desk for dates!  ? ? ? ??Like? [name] on Facebook and Instagram for our latest updates!  ?  ? ? ?A healthy democracy works best when [employer] participate! Make sure you are registered to vote! If you have moved or changed any of your contact information, you will need to get this updated before voting! ? ?In some cases, you MAY be able to register to vote online: [email or web address] ? ? ? ? ? ? ? Airborne Adult Perc - 01/11/22 1501   ? ? Time Antigen Placed 1506   ? Allergen Manufacturer Lavella Hammock   ? Location Back   ? Number of Test 59   ? 1. Control-Buffer 50% Glycerol Negative   ? 2. Control-Histamine 1 mg/ml 2+   ? 3. Albumin saline Negative   ? 4. [place] Negative   ? 5. [place] Negative   ? 6. [place] Negative   ? 7. [place] Blue Negative   ? 8. Meadow Fescue Negative   ? 9. Perennial Rye Negative   ? 10. Sweet Vernal Negative   ? 11. Timothy Negative   ? 12. Cocklebur Negative   ? 13. Burweed Marshelder Negative   ? 14. Ragweed, short Negative   ? 15. Ragweed, Giant Negative   ?  16. Plantain,  English Negative   ? 17. Lamb's Quarters Negative   ? 18. Sheep Sorrell Negative   ? 19. Rough Pigweed Negative   ? 20. Marsh Elder, Rough Negative   ? 21. Mugwort, Common Negative   ? 22. Ash mix Negative   ? 23. Wendee Copp mix Negative   ? 24. Beech American Negative   ? 25. Box, Elder Negative   ? 26. Cedar, red Negative   ? 27. Cottonwood, [place] Negative   ? 28. Elm mix Negative   ? 29. Hickory  Negative   ? 30. Maple mix Negative   ? 31. Oak, [place] mix Negative   ? 32. Pecan Pollen Negative   ? 33. Pine mix Negative   ? 34. Sycamore Eastern Negative   ? 35. Walnut, Black Pollen Negative   ? 36. Alternaria alternata Negative   ? 32. Cladosporium Herbarum Negative   ? 38. Aspergillus mix Negative   ? 39. Penicillium mix Negative   ? 40. Bipolaris sorokiniana (Helminthosporium) Negative   ? 41. Drechslera spicifera (Curvularia) Negative   ? 42. Mucor plumbeus Negative   ? 43. Fusarium moniliforme Negative   ? 44. Aureobasidium pullulans (pullulara) Negative   ? 45. Rhizopus oryzae Negative   ? 46. Botrytis cinera Negative   ? 47. Epicoccum nigrum Negative   ? 48. Phoma betae Negative   ? 49. Candida Albicans Negative   ? 50. Trichophyton mentagrophytes Negative   ? 51. Mite, D Farinae  5,000 AU/ml Negative   ? 52. Mite, D Pteronyssinus  5,000 AU/ml Negative   ? 53. Cat Hair 10,000 BAU/ml Negative   ? 54.  Dog Epithelia Negative   ? 55. Mixed Feathers Negative   ? 56. Horse Epithelia Negative   ? 57. Cockroach, [name] Negative   ? 58. Mouse Negative   ? 59. Tobacco Leaf Negative   ? ?  ?  ? ?  ? ? Intradermal - 01/11/22 1530   ? ? Time Antigen Placed 1540   ? Allergen Manufacturer Lavella Hammock   ? Location Arm   ? Number of Test 15   ? Control Negative   ? [place] Negative   ? Johnson 1+   ? 7 Grass Negative   ? Ragweed mix 1+   ? Weed mix Negative   ? Tree mix Negative   ? Mold 1 Negative   ? Mold 2 2+   ? Mold 3 Negative   ? Mold 4 2+   ? Cat Negative   ? Dog 1+   ? Cockroach 3+   ? Mite mix 4+   ? ?  ?  ? ?  ? ? ?Reducing Pollen Exposure ? ?The American Academy of Allergy, Asthma and Immunology suggests the following steps to reduce your exposure to pollen during allergy seasons. ?   ?Do not hang sheets or clothing out to dry; pollen may collect on these items. ?Do not mow lawns or spend time around freshly cut grass; mowing stirs up pollen. ?Keep windows closed at night.  Keep car windows closed while  driving. ?Minimize morning activities outdoors, a time when pollen counts are usually at their highest. ?Stay indoors as much as possible when pollen counts or humidity is high and on windy days when pollen tends to remain in the air longer. ?Use air conditioning when possible.  Many air conditioners have filters that trap the pollen spores. ?Use a HEPA room air filter to remove pollen form the indoor air  you breathe. ? ?Control of Mold Allergen  ? ?Mold and fungi can grow on a variety of surfaces provided certain temperature and moisture conditions exist.  Outdoor molds grow on plants, decaying vegetation and soil.  The major outdoor mold, Alternaria and Cladosporium, are found in very high numbers during hot and dry conditions.  Generally, a late Summer - Fall peak is seen for common outdoor fungal spores.  Rain will temporarily lower outdoor mold spore count, but counts rise rapidly when the rainy period ends.  The most important indoor molds are Aspergillus and Penicillium.  Dark, humid and poorly ventilated basements are ideal sites for mold growth.  The next most common sites of mold growth are the bathroom and the kitchen. ? ?Indoor (Perennial) Mold Control  ? ?Positive indoor molds via skin testing: Aspergillus, Penicillium, Fusarium, Aureobasidium (Pullulara), and Rhizopus ? ?Maintain humidity below 50%. ?Clean washable surfaces with 5% bleach solution. ?Remove sources e.g. contaminated carpets. ? ? ? ?Control of Dog or Cat Allergen ? ?Avoidance is the best way to manage a dog or cat allergy. If you have a dog or cat and are allergic to dog or cats, consider removing the dog or cat from the home. ?If you have a dog or cat but don?t want to find it a new home, or if your family wants a pet even though someone in the household is allergic, here are some strategies that may help keep symptoms at bay: ? ?Keep the pet out of your bedroom and restrict it to only a few rooms. Be advised that keeping the dog or cat  in only one room will not limit the allergens to that room. ?Don?t pet, hug or kiss the dog or cat; if you do, wash your hands with soap and water. ?High-efficiency particulate air (HEPA) cleaners run contin

## 2022-01-11 NOTE — Progress Notes (Signed)
? ?NEW PATIENT ? ?Date of Service/Encounter:  01/11/22 ? ?Consult requested by: Mosie Lukes, MD ? ? ?Assessment:  ? ?Seasonal allergic rhinitis ? ?GERD ? ?Recurrent sinusitis ? ?One of 10 siblings ? ?Plan/Recommendations:  ? ?1. Chronic rhinitis ?- Testing today showed: grasses, ragweed, indoor molds, dust mites, dog, and cockroach. ?- Copy of test results provided.  ?- Avoidance measures provided. ?- Continue with: Zyrtec (cetirizine) '10mg'$  tablet once daily, Singulair (montelukast) '10mg'$  daily, Flonase (fluticasone) one spray per nostril daily (AIM FOR EAR ON EACH SIDE), and Astelin (azelastine) 2 sprays per nostril 1-2 times daily as needed ?- You can use an extra dose of the antihistamine, if needed, for breakthrough symptoms.  ?- Consider nasal saline rinses 1-2 times daily to remove allergens from the nasal cavities as well as help with mucous clearance (this is especially helpful to do before the nasal sprays are given) ?- We will plan to start allergy shots as a means of long-term control. ?- Allergy shots "re-train" and "reset" the immune system to ignore environmental allergens and decrease the resulting immune response to those allergens (sneezing, itchy watery eyes, runny nose, nasal congestion, etc).    ?- Allergy shots improve symptoms in 75-85% of patients.  ?- Information provided on [facility] immunotherapy as well, which will remove a few months of visits to get you to the top dose sooner. ?- Codes provided for [facility] immunotherapy. ?- Someone will talk to you about this from the [facility] office.  ? ?2. Return in about 3 months (around 04/13/2022).  ? ? ?This note in its entirety was forwarded to the Provider who requested this consultation. ? ?Subjective:  ? ?Brandi Lester is a 56 y.o. female presenting today for evaluation of  ?Chief Complaint  ?Patient presents with  ? Allergy Testing  ?  Retest. Completed injections in the past but now needs a retest because she is having too many sinus  infections so doctor sent her back to [name]. Potentially wants to restart allergy injections.  ? Allergic Rhinitis   ? ? ?Brandi Lester has a history of the following: ?Patient Active Problem List  ? Diagnosis Date Noted  ? Class 3 severe obesity with serious comorbidity and body mass index (BMI) of 45.0 to 49.9 in adult [facility]) 06/07/2021  ? Symptomatic PVCs 01/16/2021  ? PAC (premature atrial contraction) 01/16/2021  ? Right elbow pain 12/05/2020  ? Palpitations 10/19/2020  ? Chest pain of uncertain etiology 50/27/7412  ? Morbid obesity ([place]) 10/19/2020  ? Mixed hyperlipidemia 10/19/2020  ? PVC (premature ventricular contraction)   ? Lactose intolerance   ? Joint pain   ? IBS (irritable bowel syndrome)   ? Hypertension associated with type 2 diabetes mellitus ([place])   ? Back pain   ? Arthritis   ? Anxiety   ? Angio-edema   ? Allergy   ? ADD (attention deficit disorder)   ? High serum vitamin B12 06/01/2020  ? Low level of high density lipoprotein (HDL) 07/08/2019  ? Fibroid 09/15/2018  ? Depression 06/26/2018  ? Hypertriglyceridemia 09/12/2017  ? Vitamin D deficiency 07/02/2017  ? Diabetes mellitus ([place]) 07/02/2017  ? Hypokalemia 09/25/2015  ? Preventative health care 09/25/2015  ? Pedal edema 02/20/2015  ? Gastro-esophageal reflux 11/15/2013  ? Anemia 07/25/2013  ? Hyperlipidemia associated with type 2 diabetes mellitus ([place]) 07/25/2013  ? Insomnia 04/20/2013  ? Ingrown nail 06/2012  ? Musculoskeletal pain 12/07/2011  ? Diabetes mellitus type 2 in obese ([place]) 11/26/2008  ?  HEMORRHOIDS, EXTERNAL 03/25/2008  ? Essential hypertension 03/26/2007  ? PREMATURE VENTRICULAR CONTRACTIONS 03/26/2007  ? Allergic rhinitis 03/26/2007  ? ? ?History obtained from: chart review and patient. ? ?Brandi Lester was referred by Mosie Lukes, MD.    ? ?Lexis is a 56 y.o. female presenting for an evaluation of environmental allergies .  She was previously seen by Dr. Neldon Mc.  Her last visit was July 2019.  She was  receiving 3 allergy injections.  1 containing trees, another continued cockroach, and another contains dust mite and weeds.  She was getting 0.5 mL of the Red Vial every 4 weeks. ? ?Since last visit, she has done well for a period of time.  She reports that she was good for approximately 2 years after stopping her allergy shots.  Then she started to have sinus infections again.  She estimates that she was getting antibiotics every 6 to 8 weeks.  She has a penicillin allergy, and mostly receives doxycycline for her sinus infections.  She also been getting prednisone for her sinus infections.  This combination does clear up her sinuses and symptoms very well.  However, symptoms returned within 2 months. ? ?These are mostly isolated to sinus infections.  She did have an ear infection at one point.  She has not had pneumonias or other infections.  She has never needed IV antibiotics and has never been to the hospital.  She has no history of autoimmune issues or cytopenias. ? ?She has no history of asthma, food allergies, or eczema.  She will get hives in her hands occasionally when she is exposed to certain environmental triggers. ? ?She is planning to go to [place] in August with her sister.  She is 1 of 10 children. ?  ?Otherwise, there is no history of other atopic diseases, including drug allergies, stinging insect allergies, eczema, urticaria, or contact dermatitis. There is no significant infectious history. Vaccinations are up to date.  ? ? ?Past Medical History: ?Patient Active Problem List  ? Diagnosis Date Noted  ? Class 3 severe obesity with serious comorbidity and body mass index (BMI) of 45.0 to 49.9 in adult [facility]) 06/07/2021  ? Symptomatic PVCs 01/16/2021  ? PAC (premature atrial contraction) 01/16/2021  ? Right elbow pain 12/05/2020  ? Palpitations 10/19/2020  ? Chest pain of uncertain etiology 92/33/0076  ? Morbid obesity ([place]) 10/19/2020  ? Mixed hyperlipidemia 10/19/2020  ? PVC (premature ventricular  contraction)   ? Lactose intolerance   ? Joint pain   ? IBS (irritable bowel syndrome)   ? Hypertension associated with type 2 diabetes mellitus ([place])   ? Back pain   ? Arthritis   ? Anxiety   ? Angio-edema   ? Allergy   ? ADD (attention deficit disorder)   ? High serum vitamin B12 06/01/2020  ? Low level of high density lipoprotein (HDL) 07/08/2019  ? Fibroid 09/15/2018  ? Depression 06/26/2018  ? Hypertriglyceridemia 09/12/2017  ? Vitamin D deficiency 07/02/2017  ? Diabetes mellitus ([place]) 07/02/2017  ? Hypokalemia 09/25/2015  ? Preventative health care 09/25/2015  ? Pedal edema 02/20/2015  ? Gastro-esophageal reflux 11/15/2013  ? Anemia 07/25/2013  ? Hyperlipidemia associated with type 2 diabetes mellitus ([place]) 07/25/2013  ? Insomnia 04/20/2013  ? Ingrown nail 06/2012  ? Musculoskeletal pain 12/07/2011  ? Diabetes mellitus type 2 in obese ([place]) 11/26/2008  ? HEMORRHOIDS, EXTERNAL 03/25/2008  ? Essential hypertension 03/26/2007  ? PREMATURE VENTRICULAR CONTRACTIONS 03/26/2007  ? Allergic rhinitis 03/26/2007  ? ? ?  Medication List:  ?Allergies as of 01/11/2022   ? ?   Reactions  ? Penicillins Itching, Swelling  ? Has patient had a PCN reaction causing immediate rash, facial/tongue/throat swelling, SOB or lightheadedness with hypotension: yes ?Has patient had a PCN reaction causing severe rash involving mucus membranes or skin necrosis: no ?Has patient had a PCN reaction that required hospitalization no ?Has patient had a PCN reaction occurring within the last 10 years: yes ?If all of the above answers are "NO", then may proceed with Cephalosporin use.  ? Victoza [liraglutide]   ? Vomiting  ? ?  ? ?  ?Medication List  ?  ? ?  ? Accurate as of Jan 11, 2022 11:59 PM. If you have any questions, ask your nurse or doctor.  ?  ?  ? ?  ? ?acetaminophen 650 MG CR tablet ?Commonly known as: Tylenol 8 Hour ?Take 1 tablet (650 mg total) every 8 (eight) hours as needed by mouth for pain. ?  ?amLODipine 10 MG tablet ?Commonly known  as: NORVASC ?Take 1 tablet (10 mg total) by mouth daily. ?  ?atorvastatin 10 MG tablet ?Commonly known as: LIPITOR ?TAKE 1 TABLET BY MOUTH ONCE DAILY ?  ?azelastine 0.1 % nasal spray ?Commonly known as: ASTELIN ?Place

## 2022-01-12 ENCOUNTER — Encounter: Payer: Self-pay | Admitting: Allergy & Immunology

## 2022-01-12 DIAGNOSIS — J310 Chronic rhinitis: Secondary | ICD-10-CM | POA: Insufficient documentation

## 2022-01-17 NOTE — Progress Notes (Signed)
Chief Complaint:   OBESITY Brandi Lester is here to discuss her progress with her obesity treatment plan along with follow-up of her obesity related diagnoses. Brandi Lester is on the Category 4 Plan and states she is following her eating plan approximately 75% of the time. Brandi Lester states she is using her exercise bike and walking 30-45 minutes 4 times per week.  Today's visit was #: 47 Starting weight: 325 lbs Starting date: 05/23/2017 Today's weight: 281 lbs Today's date: 01/08/2022 Total lbs lost to date: 44 lbs Total lbs lost since last in-office visit: 0  Interim History: Annual physical with fasting labs completed with Dr. Sherral Hammers on 12/14/21. Reviewed labs results with Saralyn.   Subjective:   1. Hyperlipidemia associated with type 2 diabetes mellitus ([place]) 12/14/21:  Lipid panel- HDL 33 All other levels at goal She is on Atorvastatin 10 mg daily, denies myalgias.  Discussed labs with patient today.   2. Type 2 diabetes mellitus with other specified complication, without long-term current use of insulin (HCC) [name] 10 mg she experienced polyphagia. [name] 12.5 mg suppresses her appetite to the point that is challenged to consume all foods on prescribed meal plan.  12/14/21:  A1c 5.8, at goal. Discussed labs with patient today.   3. Vitamin D deficiency 12/14/2021 Vitamin D level 90.2 She is on OTC Vitamin D3, 1,000 IU daily Discussed labs with patient today.  4. At risk for deficient intake of food The patient is at a higher than average risk of deficient intake of food due to nausea and mounjaro.    Assessment/Plan:   1. Hyperlipidemia associated with type 2 diabetes mellitus (HCC) Refill Atorvastatin 10 mg  daily disp #90 tab.  See below. Discussed labs with patient today.   - atorvastatin (LIPITOR) 10 MG tablet; TAKE 1 TABLET BY MOUTH ONCE DAILY  Dispense: 90 tablet; Refill: 0  2. Type 2 diabetes mellitus with other specified complication, without long-term  current use of insulin (HCC) Refill [name] 10 mg once weekly disp. 6 mL.  3. Vitamin D deficiency Check vit d level in 2 months. Take OTC Vitamin  D3 1,000 IU every other day.  4. At risk for deficient intake of food Brandi Lester was given approximately 15 minutes of deficient intake of food prevention counseling today. Brandi Lester is at risk for eating too few calories based on current food recall. She was encouraged to focus on meeting caloric and protein goals according to her recommended meal plan.   5. Obesity: BMI 40.4 Brandi Lester is currently in the action stage of change. As such, her goal is to continue with weight loss efforts. She has agreed to the Category 4 Plan.   Exercise goals:  As is.   Behavioral modification strategies: increasing lean protein intake, decreasing simple carbohydrates, meal planning and cooking strategies, keeping healthy foods in the home, and planning for success.  Dotty has agreed to follow-up with our clinic in 4 weeks. She was informed of the importance of frequent follow-up visits to maximize her success with intensive lifestyle modifications for her multiple health conditions.   Objective:   Blood pressure 104/69, pulse 83, temperature 98.7 F (37.1 C), height '5\' 10"'$  (1.778 m), weight 281 lb (127.5 kg), SpO2 100 %. Body mass index is 40.32 kg/m.  General: Cooperative, alert, well developed, in no acute distress. HEENT: Conjunctivae and lids unremarkable. Cardiovascular: Regular rhythm.  Lungs: Normal work of breathing. Neurologic: No focal deficits.   Lab Results  Component Value Date   CREATININE 0.78  12/14/2021   BUN 11 12/14/2021   NA 138 12/14/2021   K 3.6 12/14/2021   CL 103 12/14/2021   CO2 27 12/14/2021   Lab Results  Component Value Date   ALT 10 12/14/2021   AST 10 12/14/2021   ALKPHOS 84 12/14/2021   BILITOT 0.3 12/14/2021   Lab Results  Component Value Date   HGBA1C 5.8 12/14/2021   HGBA1C 5.5 06/06/2021   HGBA1C 5.6  11/28/2020   HGBA1C 6.1 03/21/2020   HGBA1C 5.9 (H) 12/01/2019   Lab Results  Component Value Date   INSULIN 20.7 12/01/2019   INSULIN 12.9 07/07/2019   INSULIN 21.2 11/20/2018   INSULIN 15.4 07/14/2018   INSULIN 12.9 09/12/2017   Lab Results  Component Value Date   TSH 1.19 12/14/2021   Lab Results  Component Value Date   CHOL 111 12/14/2021   HDL 33.30 (L) 12/14/2021   LDLCALC 60 12/14/2021   TRIG 88.0 12/14/2021   CHOLHDL 3 12/14/2021   Lab Results  Component Value Date   VD25OH 90.29 12/14/2021   VD25OH 78.96 06/06/2021   VD25OH 78 11/28/2020   Lab Results  Component Value Date   WBC 6.6 12/14/2021   HGB 12.6 12/14/2021   HCT 37.3 12/14/2021   MCV 83.8 12/14/2021   PLT 300.0 12/14/2021   No results found for: IRON, TIBC, FERRITIN  Attestation Statements:   Reviewed by clinician on day of visit: allergies, medications, problem list, medical history, surgical history, family history, social history, and previous encounter notes.  I, Davy Pique, RMA, am acting as Location manager for Mina Marble, NP.  I have reviewed the above documentation for accuracy and completeness, and I agree with the above. -  Mazikeen Hehn d. Loyal Rudy, NP-C

## 2022-01-25 ENCOUNTER — Other Ambulatory Visit (HOSPITAL_BASED_OUTPATIENT_CLINIC_OR_DEPARTMENT_OTHER): Payer: Self-pay

## 2022-02-05 ENCOUNTER — Ambulatory Visit (INDEPENDENT_AMBULATORY_CARE_PROVIDER_SITE_OTHER): Payer: BC Managed Care – PPO | Admitting: Adult Health

## 2022-02-05 ENCOUNTER — Other Ambulatory Visit (HOSPITAL_BASED_OUTPATIENT_CLINIC_OR_DEPARTMENT_OTHER): Payer: Self-pay

## 2022-02-05 ENCOUNTER — Encounter (INDEPENDENT_AMBULATORY_CARE_PROVIDER_SITE_OTHER): Payer: Self-pay | Admitting: Adult Health

## 2022-02-05 VITALS — BP 110/71 | HR 94 | Temp 98.6°F | Ht 70.0 in | Wt 281.0 lb

## 2022-02-05 DIAGNOSIS — F3289 Other specified depressive episodes: Secondary | ICD-10-CM | POA: Diagnosis not present

## 2022-02-05 DIAGNOSIS — Z6841 Body Mass Index (BMI) 40.0 and over, adult: Secondary | ICD-10-CM | POA: Diagnosis not present

## 2022-02-05 DIAGNOSIS — E1169 Type 2 diabetes mellitus with other specified complication: Secondary | ICD-10-CM

## 2022-02-05 DIAGNOSIS — Z7985 Long-term (current) use of injectable non-insulin antidiabetic drugs: Secondary | ICD-10-CM

## 2022-02-05 DIAGNOSIS — E669 Obesity, unspecified: Secondary | ICD-10-CM | POA: Diagnosis not present

## 2022-02-05 DIAGNOSIS — Z9189 Other specified personal risk factors, not elsewhere classified: Secondary | ICD-10-CM

## 2022-02-05 DIAGNOSIS — Z7984 Long term (current) use of oral hypoglycemic drugs: Secondary | ICD-10-CM

## 2022-02-05 MED ORDER — METFORMIN HCL 500 MG PO TABS
500.0000 mg | ORAL_TABLET | Freq: Two times a day (BID) | ORAL | 0 refills | Status: DC
  Filled 2022-02-05 – 2022-02-28 (×2): qty 180, 90d supply, fill #0

## 2022-02-05 MED ORDER — BUPROPION HCL ER (SR) 200 MG PO TB12
200.0000 mg | ORAL_TABLET | Freq: Every morning | ORAL | 0 refills | Status: DC
  Filled 2022-02-05 – 2022-03-12 (×2): qty 90, 90d supply, fill #0

## 2022-02-05 MED ORDER — MOUNJARO 12.5 MG/0.5ML ~~LOC~~ SOAJ
12.5000 mg | SUBCUTANEOUS | 0 refills | Status: DC
  Filled 2022-02-05: qty 2, 28d supply, fill #0

## 2022-02-07 ENCOUNTER — Other Ambulatory Visit (HOSPITAL_BASED_OUTPATIENT_CLINIC_OR_DEPARTMENT_OTHER): Payer: Self-pay

## 2022-02-10 DIAGNOSIS — Z9189 Other specified personal risk factors, not elsewhere classified: Secondary | ICD-10-CM | POA: Insufficient documentation

## 2022-02-10 NOTE — Progress Notes (Unsigned)
Chief Complaint:   OBESITY Brandi Lester is here to discuss her progress with her obesity treatment plan along with follow-up of her obesity related diagnoses. Brandi Lester is on the Category 4 Plan and journaling and states she is following her eating plan approximately 50% of the time. Brandi Lester states she is walking 30-45 3-4 times per week.  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  Increased stress leads to increased drinking of sugary drinks ie:  Venti [employer]'s.  Subjective:   1. Type 2 diabetes mellitus with other specified complication, without long-term current use of insulin (HCC) Brandi Lester is on Metformin 500 mg BID with meals and [name] 10 mg, injects on Thursday.  Polyphagia develops.   2. Other depression, with emotional eating Brandi Lester is on Buproprion SR 200 mg daily, reports stable mood.  She denies any suicidal or homicidal ideations.   3. At risk for nausea Brandi Lester is at risk for nausea due to increase in [place].    Assessment/Plan:   1. Type 2 diabetes mellitus with other specified complication, without long-term current use of insulin (HCC) Refill Metformin 500 mg BID with meals, dispense #180, no refills. Refill, increase [name] to 12.5 mg once weekly dispense 6 mL, no refills.  See below.   - tirzepatide ([name]) 12.5 MG/0.5ML Pen; Inject 12.5 mg into the skin once a week.  Dispense: 6 mL; Refill: 0 - metFORMIN (GLUCOPHAGE) 500 MG tablet; Take 1 tablet (500 mg total) by mouth 2 (two) times daily with a meal.  Dispense: 180 tablet; Refill: 0  2. Other depression, with emotional eating Refill Buproprion SR 200 mg daily, dispense #90 no refills, see below.   - buPROPion (WELLBUTRIN SR) 200 MG 12 hr  tablet; Take 1 tablet (200 mg total) by mouth in the morning.  Dispense: 90 tablet; Refill: 0  3. At risk for nausea Brandi Lester was given approximately 15 minutes of nausea prevention counseling today. Brandi Lester is at risk for nausea due to her new or current medication. She was encouraged to titrate her medication slowly, make sure to stay hydrated, eat smaller portions throughout the day, and avoid high fat meals.     4. Obesity: BMI 40.4 Brandi Lester is currently in the action stage of change. As such, her goal is to continue with weight loss efforts. She has agreed to the Category 4 Plan.   Exercise goals:  As is.   Behavioral modification strategies: increasing lean protein intake, decreasing simple carbohydrates, meal planning and cooking strategies, keeping healthy foods in the home, and planning for success.  Brandi Lester has agreed to follow-up with our clinic in 3-4 weeks. She was informed of the importance of frequent follow-up visits to maximize her success with intensive lifestyle modifications for her multiple health conditions.   Objective:   Blood pressure 110/71, pulse 94, temperature 98.6 F (37 C), height '5\' 10"'$  (1.778 m), weight 281 lb (127.5 kg), SpO2 100 %. Body mass index is 40.32 kg/m.  General: Cooperative, alert, well developed, in no acute distress. HEENT: Conjunctivae and lids unremarkable. Cardiovascular: Regular rhythm.  Lungs: Normal work of breathing. Neurologic: No focal deficits.   Lab Results  Component Value Date   CREATININE 0.78 12/14/2021   BUN 11 12/14/2021   NA  138 12/14/2021   K 3.6 12/14/2021   CL 103 12/14/2021   CO2 27 12/14/2021   Lab Results  Component Value Date   ALT 10 12/14/2021   AST 10 12/14/2021   ALKPHOS 84 12/14/2021   BILITOT 0.3 12/14/2021   Lab Results  Component Value Date   HGBA1C 5.8 12/14/2021   HGBA1C 5.5 06/06/2021   HGBA1C 5.6 11/28/2020   HGBA1C 6.1 03/21/2020   HGBA1C 5.9 (H) 12/01/2019   Lab Results   Component Value Date   INSULIN 20.7 12/01/2019   INSULIN 12.9 07/07/2019   INSULIN 21.2 11/20/2018   INSULIN 15.4 07/14/2018   INSULIN 12.9 09/12/2017   Lab Results  Component Value Date   TSH 1.19 12/14/2021   Lab Results  Component Value Date   CHOL 111 12/14/2021   HDL 33.30 (L) 12/14/2021   LDLCALC 60 12/14/2021   TRIG 88.0 12/14/2021   CHOLHDL 3 12/14/2021   Lab Results  Component Value Date   VD25OH 90.29 12/14/2021   VD25OH 78.96 06/06/2021   VD25OH 78 11/28/2020   Lab Results  Component Value Date   WBC 6.6 12/14/2021   HGB 12.6 12/14/2021   HCT 37.3 12/14/2021   MCV 83.8 12/14/2021   PLT 300.0 12/14/2021   No results found for: "IRON", "TIBC", "FERRITIN"  Attestation Statements:   Reviewed by clinician on day of visit: allergies, medications, problem list, medical history, surgical history, family history, social history, and previous encounter notes.  I, Davy Pique, RMA, am acting as Location manager for Mina Marble, NP.   I have reviewed the above documentation for accuracy and completeness, and I agree with the above. -  ***

## 2022-02-12 ENCOUNTER — Other Ambulatory Visit (HOSPITAL_BASED_OUTPATIENT_CLINIC_OR_DEPARTMENT_OTHER): Payer: Self-pay

## 2022-02-12 ENCOUNTER — Other Ambulatory Visit: Payer: Self-pay | Admitting: Cardiology

## 2022-02-12 MED ORDER — CARVEDILOL 25 MG PO TABS
25.0000 mg | ORAL_TABLET | Freq: Two times a day (BID) | ORAL | 1 refills | Status: DC
  Filled 2022-02-12: qty 180, 90d supply, fill #0
  Filled 2022-05-03: qty 180, 90d supply, fill #1

## 2022-02-12 NOTE — Telephone Encounter (Signed)
Rx refill sent to pharmacy. 

## 2022-02-21 ENCOUNTER — Other Ambulatory Visit (HOSPITAL_BASED_OUTPATIENT_CLINIC_OR_DEPARTMENT_OTHER): Payer: Self-pay

## 2022-02-21 ENCOUNTER — Other Ambulatory Visit: Payer: Self-pay | Admitting: Family Medicine

## 2022-02-22 ENCOUNTER — Other Ambulatory Visit (HOSPITAL_BASED_OUTPATIENT_CLINIC_OR_DEPARTMENT_OTHER): Payer: Self-pay

## 2022-02-22 MED ORDER — PANTOPRAZOLE SODIUM 40 MG PO TBEC
DELAYED_RELEASE_TABLET | Freq: Every day | ORAL | 1 refills | Status: DC
  Filled 2022-02-22: qty 90, 90d supply, fill #0
  Filled 2022-05-25: qty 90, 90d supply, fill #1

## 2022-02-28 ENCOUNTER — Other Ambulatory Visit (HOSPITAL_BASED_OUTPATIENT_CLINIC_OR_DEPARTMENT_OTHER): Payer: Self-pay

## 2022-03-12 ENCOUNTER — Ambulatory Visit (INDEPENDENT_AMBULATORY_CARE_PROVIDER_SITE_OTHER): Payer: BC Managed Care – PPO | Admitting: Adult Health

## 2022-03-12 ENCOUNTER — Encounter (INDEPENDENT_AMBULATORY_CARE_PROVIDER_SITE_OTHER): Payer: Self-pay | Admitting: Adult Health

## 2022-03-12 ENCOUNTER — Other Ambulatory Visit (HOSPITAL_BASED_OUTPATIENT_CLINIC_OR_DEPARTMENT_OTHER): Payer: Self-pay

## 2022-03-12 VITALS — BP 118/78 | HR 88 | Temp 97.6°F | Ht 70.0 in | Wt 281.0 lb

## 2022-03-12 DIAGNOSIS — E1169 Type 2 diabetes mellitus with other specified complication: Secondary | ICD-10-CM

## 2022-03-12 DIAGNOSIS — Z7985 Long-term (current) use of injectable non-insulin antidiabetic drugs: Secondary | ICD-10-CM

## 2022-03-12 DIAGNOSIS — E669 Obesity, unspecified: Secondary | ICD-10-CM

## 2022-03-12 DIAGNOSIS — Z6841 Body Mass Index (BMI) 40.0 and over, adult: Secondary | ICD-10-CM | POA: Diagnosis not present

## 2022-03-12 DIAGNOSIS — Z7984 Long term (current) use of oral hypoglycemic drugs: Secondary | ICD-10-CM

## 2022-03-12 DIAGNOSIS — F3289 Other specified depressive episodes: Secondary | ICD-10-CM | POA: Diagnosis not present

## 2022-03-12 DIAGNOSIS — Z9189 Other specified personal risk factors, not elsewhere classified: Secondary | ICD-10-CM

## 2022-03-12 MED ORDER — MOUNJARO 12.5 MG/0.5ML ~~LOC~~ SOAJ
12.5000 mg | SUBCUTANEOUS | 0 refills | Status: DC
  Filled 2022-03-12: qty 6, 84d supply, fill #0

## 2022-03-13 ENCOUNTER — Other Ambulatory Visit (HOSPITAL_BASED_OUTPATIENT_CLINIC_OR_DEPARTMENT_OTHER): Payer: Self-pay

## 2022-03-13 NOTE — Progress Notes (Unsigned)
Chief Complaint:   OBESITY Brandi Lester is here to discuss her progress with her obesity treatment plan along with follow-up of her obesity related diagnoses. Brandi Lester is on the Category 4 Plan and states she is following her eating plan approximately 50% of the time. Brandi Lester states she is doing 0 minutes 0 times per week.  Today's visit was #: 56 Starting weight: 325 lbs Starting date: 05/23/2017 Today's weight: 281 lbs Today's date: 03/12/2022 Total lbs lost to date: 44 Total lbs lost since last in-office visit: 0  Interim History: Brandi's Lester was increased from '10mg'$  to 12.5 mg at her last office visit on 02/05/2022.  She has had 3 doses at this strength, and notes mild nausea without vomiting on Thursday (injects day of Lester).  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  Subjective:   1. Type 2 diabetes mellitus with other specified complication, without long-term current use of insulin (HCC) Brandi Lester is on metformin 500 mg twice daily with meals,***.  On 12/14/2021 her A1c was 5.8, at goal but slightly elevated insulin level at 12.5.  2. Other depression, with emotional eating Brandi Lester's blood pressure and heart rate are excellent at her office visit today.  She reports stable mood, and denies suicidal or homicidal ideations.  3. At risk for deficient intake of food The patient is at a higher than average risk of deficient intake of food due to nausea with GLP-1 therapy.  Assessment/Plan:   1. Type 2 diabetes mellitus with other specified complication, without long-term current use of insulin (HCC) We will refill Lester at 12.5 mg once weekly for 1 month. Brandi Lester is to not overeat.  - tirzepatide (Lester) 12.5 MG/0.5ML Pen; Inject 12.5 mg into the skin once a week.  Dispense: 6 mL; Refill: 0  2. Other depression, with emotional eating Brandi Lester will continue bupropion SR 200 mg once daily, and we will follow-up at her next office  visit.  3. At risk for deficient intake of food Brandi Lester was given approximately 15 minutes of deficient intake of food prevention counseling today. Brandi Lester is at risk for eating too few calories based on current food recall. She was encouraged to focus on meeting caloric and protein goals according to her recommended meal plan.  4. Obesity: BMI 40.3 [place] is currently in the action stage of change. As such, her goal is to continue with weight loss efforts. She has agreed to the Category 4 Plan.   Exercise goals: Walk for 30 to 45 minutes, bike ride 2 times per week.  Behavioral modification strategies: increasing lean protein intake, decreasing simple carbohydrates, meal planning and cooking strategies, keeping healthy foods in the home, and planning for success.  Brandi Lester has agreed to follow-up with our clinic in 4 to 5 weeks. She was informed of the importance of frequent follow-up visits to maximize her success with intensive lifestyle modifications for her multiple health conditions.   Objective:   Blood pressure 118/78, pulse 88, temperature 97.6 F (36.4 C), height '5\' 10"'$  (1.778 m), weight 281 lb (127.5 kg), SpO2 99 %. Body mass index is 40.32 kg/m.  General: Cooperative, alert, well developed, in no acute distress. HEENT: Conjunctivae and lids unremarkable. Cardiovascular: Regular rhythm.  Lungs: Normal work of breathing. Neurologic: No focal deficits.   Lab Results  Component Value Date   CREATININE 0.78 12/14/2021   BUN 11 12/14/2021   NA 138 12/14/2021   K 3.6 12/14/2021   CL 103 12/14/2021  CO2 27 12/14/2021   Lab Results  Component Value Date   ALT 10 12/14/2021   AST 10 12/14/2021   ALKPHOS 84 12/14/2021   BILITOT 0.3 12/14/2021   Lab Results  Component Value Date   HGBA1C 5.8 12/14/2021   HGBA1C 5.5 06/06/2021   HGBA1C 5.6 11/28/2020   HGBA1C 6.1 03/21/2020   HGBA1C 5.9 (H) 12/01/2019   Lab Results  Component Value Date   INSULIN 20.7 12/01/2019    INSULIN 12.9 07/07/2019   INSULIN 21.2 11/20/2018   INSULIN 15.4 07/14/2018   INSULIN 12.9 09/12/2017   Lab Results  Component Value Date   TSH 1.19 12/14/2021   Lab Results  Component Value Date   CHOL 111 12/14/2021   HDL 33.30 (L) 12/14/2021   LDLCALC 60 12/14/2021   TRIG 88.0 12/14/2021   CHOLHDL 3 12/14/2021   Lab Results  Component Value Date   VD25OH 90.29 12/14/2021   VD25OH 78.96 06/06/2021   VD25OH 78 11/28/2020   Lab Results  Component Value Date   WBC 6.6 12/14/2021   HGB 12.6 12/14/2021   HCT 37.3 12/14/2021   MCV 83.8 12/14/2021   PLT 300.0 12/14/2021   No results found for: "IRON", "TIBC", "FERRITIN"  Attestation Statements:   Reviewed by clinician on day of visit: allergies, medications, problem list, medical history, surgical history, family history, social history, and previous encounter notes.   Wilhemena Durie, am acting as transcriptionist for Brandi Marble, NP.  I have reviewed the above documentation for accuracy and completeness, and I agree with the above. -  ***

## 2022-03-14 ENCOUNTER — Other Ambulatory Visit (INDEPENDENT_AMBULATORY_CARE_PROVIDER_SITE_OTHER): Payer: Self-pay | Admitting: Adult Health

## 2022-03-14 ENCOUNTER — Encounter (INDEPENDENT_AMBULATORY_CARE_PROVIDER_SITE_OTHER): Payer: Self-pay | Admitting: Adult Health

## 2022-03-14 ENCOUNTER — Other Ambulatory Visit (HOSPITAL_BASED_OUTPATIENT_CLINIC_OR_DEPARTMENT_OTHER): Payer: Self-pay

## 2022-03-14 MED ORDER — OZEMPIC (2 MG/DOSE) 8 MG/3ML ~~LOC~~ SOPN
2.0000 mg | PEN_INJECTOR | SUBCUTANEOUS | 0 refills | Status: DC
  Filled 2022-03-14: qty 3, 28d supply, fill #0

## 2022-03-14 NOTE — Telephone Encounter (Signed)
[name] not covered on her plan she was using co-pay card. PA was denied 06/08/2021. Her plans preferred medications are Ozempic, Rybelsus,Trulicity. Please advise.

## 2022-04-04 NOTE — Progress Notes (Signed)
ANNUAL EXAM Patient name: Brandi Lester MRN [id number]  Date of birth: Apr 05, 1966 Chief Complaint:   Annual Exam  History of Present Illness:   Brandi Lester is a 56 y.o. G0P0000 female being seen today for a routine annual exam.   Current complaints: None  Patient's last menstrual period was 03/06/2022 (exact date).  Last pap: 02/2020. Results were: NILM w/ HRHPV negative. H/O abnormal pap: no Last MXR: 09/28/2021  Health Maintenance Due  Topic Date Due   INFLUENZA VACCINE  04/03/2022    Review of Systems:   Pertinent items are noted in HPI Denies any headaches, blurred vision, fatigue, shortness of breath, chest pain, abdominal pain, abnormal vaginal discharge/itching/odor/irritation, problems with periods, bowel movements, urination, or intercourse unless otherwise stated above.  Pertinent History Reviewed:  Reviewed past medical,surgical, social and family history.  Reviewed problem list, medications and allergies. Physical Assessment:   Vitals:   04/09/22 1033  BP: 126/69  Pulse: 95  Weight: 286 lb (129.7 kg)  Height: '5\' 10"'$  (1.778 m)  Body mass index is 41.04 kg/m.   Physical Examination:  General appearance - well appearing, and in no distress Mental status - alert, oriented to person, place, and time Psych:  She has a normal mood and affect Skin - warm and dry, normal color, no suspicious lesions noted Chest - effort normal Heart - normal rate  Breasts - breasts appear normal, no suspicious masses, no skin or nipple changes or axillary nodes Abdomen - soft, nontender, nondistended, no masses or organomegaly Pelvic -  VULVA: normal appearing vulva with no masses, tenderness or lesions  VAGINA: normal appearing vagina with normal color and discharge, no lesions  CERVIX: normal appearing cervix without discharge or lesions, no CMT UTERUS: uterus is felt to be enlarged size, shape, consistency and nontender ADNEXA: No adnexal masses or tenderness  noted. Extremities:  No swelling or varicosities noted  Chaperone present for exam  No results found for this or any previous visit (from the past 24 hour(s)).  Assessment & Plan:  Brandi Lester was seen today for annual exam.  Diagnoses and all orders for this visit:  Encounter for annual routine gynecological examination - Cervical cancer screening: Discussed guidelines. Pap with HPV up to date - Breast Health: Encouraged self breast awareness/SBE. Discussed limits of clinical breast exam for detecting breast cancer. Discussed importance of annual MXR. MXR is up to date: 09/2021 - Climacteric/Sexual health: Reviewed typical and atypical symptoms of menopause/peri-menopause. Discussed PMB and to call if any amount of spotting.  - Colonoscopy: 2021 - F/U 12 months and prn  Fibroids - Discussed may have grown since her prior imaging based on exam - feels about 15-16wk size.  - Discussed no intervention required unless she has issues with bleeding or pain or they are growing once she is through menopause. She has not yet hit one year of no period but we discussed the importance of that milestone.  -     US PELVIC COMPLETE WITH TRANSVAGINAL; Future  Other orders -     medroxyPROGESTERone (PROVERA) 10 MG tablet; TAKE 1 TABLET (10 MG TOTAL) BY MOUTH DAILY.   Orders Placed This Encounter  Procedures   US PELVIC COMPLETE WITH TRANSVAGINAL    Meds:  Meds ordered this encounter  Medications   medroxyPROGESTERone (PROVERA) 10 MG tablet    Sig: TAKE 1 TABLET (10 MG TOTAL) BY MOUTH DAILY.    Dispense:  90 tablet    Refill:  3    Follow-up:  Return in about 1 year (around 04/10/2023) for annual.  Radene Gunning, MD 04/09/2022 11:06 AM

## 2022-04-09 ENCOUNTER — Other Ambulatory Visit (HOSPITAL_BASED_OUTPATIENT_CLINIC_OR_DEPARTMENT_OTHER): Payer: Self-pay

## 2022-04-09 ENCOUNTER — Encounter: Payer: Self-pay | Admitting: Obstetrics and Gynecology

## 2022-04-09 ENCOUNTER — Ambulatory Visit (INDEPENDENT_AMBULATORY_CARE_PROVIDER_SITE_OTHER): Payer: BC Managed Care – PPO | Admitting: Obstetrics and Gynecology

## 2022-04-09 VITALS — BP 126/69 | HR 95 | Ht 70.0 in | Wt 286.0 lb

## 2022-04-09 DIAGNOSIS — D219 Benign neoplasm of connective and other soft tissue, unspecified: Secondary | ICD-10-CM

## 2022-04-09 DIAGNOSIS — Z01419 Encounter for gynecological examination (general) (routine) without abnormal findings: Secondary | ICD-10-CM

## 2022-04-09 LAB — HM PAP SMEAR: HM Pap smear: NORMAL

## 2022-04-09 MED ORDER — AZITHROMYCIN 250 MG PO TABS
ORAL_TABLET | ORAL | 0 refills | Status: DC
  Filled 2022-04-09: qty 6, 5d supply, fill #0

## 2022-04-09 MED ORDER — MEDROXYPROGESTERONE ACETATE 10 MG PO TABS
ORAL_TABLET | Freq: Every day | ORAL | 3 refills | Status: DC
  Filled 2022-04-09: qty 90, 90d supply, fill #0
  Filled 2022-07-01: qty 90, 90d supply, fill #1
  Filled 2022-09-29: qty 90, 90d supply, fill #2
  Filled 2023-01-09: qty 90, 90d supply, fill #3

## 2022-04-11 ENCOUNTER — Encounter (INDEPENDENT_AMBULATORY_CARE_PROVIDER_SITE_OTHER): Payer: Self-pay

## 2022-04-16 ENCOUNTER — Encounter (INDEPENDENT_AMBULATORY_CARE_PROVIDER_SITE_OTHER): Payer: Self-pay | Admitting: Adult Health

## 2022-04-16 ENCOUNTER — Ambulatory Visit (INDEPENDENT_AMBULATORY_CARE_PROVIDER_SITE_OTHER): Payer: BC Managed Care – PPO | Admitting: Adult Health

## 2022-04-16 ENCOUNTER — Other Ambulatory Visit (HOSPITAL_BASED_OUTPATIENT_CLINIC_OR_DEPARTMENT_OTHER): Payer: Self-pay

## 2022-04-16 ENCOUNTER — Encounter: Payer: Self-pay | Admitting: Obstetrics and Gynecology

## 2022-04-16 VITALS — BP 124/75 | HR 86 | Temp 98.0°F | Ht 70.0 in | Wt 280.0 lb

## 2022-04-16 DIAGNOSIS — Z6841 Body Mass Index (BMI) 40.0 and over, adult: Secondary | ICD-10-CM

## 2022-04-16 DIAGNOSIS — E669 Obesity, unspecified: Secondary | ICD-10-CM

## 2022-04-16 DIAGNOSIS — E785 Hyperlipidemia, unspecified: Secondary | ICD-10-CM

## 2022-04-16 DIAGNOSIS — E1169 Type 2 diabetes mellitus with other specified complication: Secondary | ICD-10-CM

## 2022-04-16 DIAGNOSIS — Z7985 Long-term (current) use of injectable non-insulin antidiabetic drugs: Secondary | ICD-10-CM

## 2022-04-16 MED ORDER — ATORVASTATIN CALCIUM 10 MG PO TABS
ORAL_TABLET | Freq: Every day | ORAL | 0 refills | Status: DC
  Filled 2022-04-16: qty 90, 90d supply, fill #0

## 2022-04-16 MED ORDER — OZEMPIC (2 MG/DOSE) 8 MG/3ML ~~LOC~~ SOPN
2.0000 mg | PEN_INJECTOR | SUBCUTANEOUS | 0 refills | Status: DC
  Filled 2022-04-16: qty 3, 28d supply, fill #0

## 2022-04-17 ENCOUNTER — Ambulatory Visit: Payer: BC Managed Care – PPO | Admitting: Allergy & Immunology

## 2022-04-17 ENCOUNTER — Other Ambulatory Visit (HOSPITAL_BASED_OUTPATIENT_CLINIC_OR_DEPARTMENT_OTHER): Payer: Self-pay

## 2022-04-17 ENCOUNTER — Encounter: Payer: Self-pay | Admitting: Allergy & Immunology

## 2022-04-17 VITALS — BP 140/70 | HR 92 | Temp 98.2°F | Resp 16 | Wt 284.4 lb

## 2022-04-17 DIAGNOSIS — J3089 Other allergic rhinitis: Secondary | ICD-10-CM

## 2022-04-17 DIAGNOSIS — K219 Gastro-esophageal reflux disease without esophagitis: Secondary | ICD-10-CM | POA: Diagnosis not present

## 2022-04-17 DIAGNOSIS — J0140 Acute pansinusitis, unspecified: Secondary | ICD-10-CM

## 2022-04-17 DIAGNOSIS — J302 Other seasonal allergic rhinitis: Secondary | ICD-10-CM

## 2022-04-17 MED ORDER — FLUTICASONE PROPIONATE 50 MCG/ACT NA SUSP
2.0000 | Freq: Every day | NASAL | 5 refills | Status: DC | PRN
  Filled 2022-04-17: qty 16, 30d supply, fill #0

## 2022-04-17 MED ORDER — AZELASTINE HCL 0.1 % NA SOLN
2.0000 | Freq: Two times a day (BID) | NASAL | 5 refills | Status: DC
  Filled 2022-04-17: qty 30, 50d supply, fill #0
  Filled 2022-11-05: qty 30, 50d supply, fill #1
  Filled 2023-01-21: qty 30, 50d supply, fill #2

## 2022-04-17 NOTE — Progress Notes (Signed)
FOLLOW UP  Date of Service/Encounter:  04/17/22   Assessment:   Seasonal and perennial allergic rhinitis (grasses, ragweed, indoor molds, dust mites, dog, and cockroach)   GERD   Recurrent sinusitis   One of 10 siblings    Plan/Recommendations:   1. Chronic rhinitis - Previous testing showed: grasses, ragweed, indoor molds, dust mites, dog, and cockroach. - I would be consistent with the nose sprays to see if this CAN prevent the need for antibiotics.  - Continue with: Zyrtec (cetirizine) '10mg'$  tablet once daily, Singulair (montelukast) '10mg'$  daily, Flonase (fluticasone) one spray per nostril daily (AIM FOR EAR ON EACH SIDE), and Astelin (azelastine) 2 sprays per nostril 1-2 times daily as needed - You can use an extra dose of the antihistamine, if needed, for breakthrough symptoms.  - Consider nasal saline rinses 1-2 times daily to remove allergens from the nasal cavities as well as help with mucous clearance (this is especially helpful to do before the nasal sprays are given) - We can start traditional allergy shots since that is likely covered best.  2. Return in about 6 months (around 10/18/2022).     Subjective:   Brandi Lester is a 56 y.o. female presenting today for follow up of  Chief Complaint  Patient presents with   Follow-up    Congestion still some. Fall is the worst season for her because of the wagweed. Patient wasn't able to call insurance company to see how much they covered for Rush.     Brandi Lester has a history of the following: Patient Active Problem List   Diagnosis Date Noted   At risk for nausea 02/10/2022   Chronic rhinitis 01/12/2022   Class 3 severe obesity with serious comorbidity and body mass index (BMI) of 45.0 to 49.9 in adult [facility]) 06/07/2021   Symptomatic PVCs 01/16/2021   PAC (premature atrial contraction) 01/16/2021   Right elbow pain 12/05/2020   Palpitations 10/19/2020   Chest pain of uncertain etiology 78/58/8502    Morbid obesity ([place]) 10/19/2020   Mixed hyperlipidemia 10/19/2020   PVC (premature ventricular contraction)    Lactose intolerance    Joint pain    IBS (irritable bowel syndrome)    Hypertension associated with type 2 diabetes mellitus ([place])    Back pain    Arthritis    Anxiety    Angio-edema    Allergy    ADD (attention deficit disorder)    High serum vitamin B12 06/01/2020   Low level of high density lipoprotein (HDL) 07/08/2019   Fibroid 09/15/2018   Depression 06/26/2018   Hypertriglyceridemia 09/12/2017   Vitamin D deficiency 07/02/2017   Diabetes mellitus ([place]) 07/02/2017   Hypokalemia 09/25/2015   Preventative health care 09/25/2015   Pedal edema 02/20/2015   Gastro-esophageal reflux 11/15/2013   Anemia 07/25/2013   Hyperlipidemia associated with type 2 diabetes mellitus ([place]) 07/25/2013   Insomnia 04/20/2013   Ingrown nail 06/2012   Musculoskeletal pain 12/07/2011   Diabetes mellitus type 2 in obese ([place]) 11/26/2008   HEMORRHOIDS, EXTERNAL 03/25/2008   Essential hypertension 03/26/2007   PREMATURE VENTRICULAR CONTRACTIONS 03/26/2007   Allergic rhinitis 03/26/2007    History obtained from: chart review and patient.  Brandi Lester is a 56 y.o. female presenting for a follow up visit She was last seen in May 2023. At that time, testing was positive to multiple indoor and outdoor allergens. We continued with cetirizine as well as Flonase and Astelin.   Since the last visit, she has done well.  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 She is now working at [employer], but prior to that she was at [employer]. She is in the middle of getting an implant for her dental issues. She is also having issues with fibroids and some other issues.   Currently she is using her nose sprays. She uses saline to clean everything out. She is using her nose sprays every other day or so. She always tends to more consistent with her nasal  regimen when she goes somewhere new. She is open to starting traditional allergy shots, she just has not checked on the rush immunotherapy. She has been on traditional allergy shots in the past and has done fine with those.   GERD is controlled with the Protonix every other day. She will only increase this to twice a day currently is daily when her symptoms worsen.  She just tries to take the least effective dose of her medications to decrease her exposure.  Otherwise, there have been no changes to her past medical history, surgical history, family history, or social history.    Review of Systems  Constitutional: Negative.  Negative for fever, malaise/fatigue and weight loss.  HENT: Negative.  Negative for congestion, ear discharge and ear pain.   Eyes:  Negative for pain, discharge and redness.  Respiratory:  Negative for cough, sputum production, shortness of breath and wheezing.   Cardiovascular: Negative.  Negative for chest pain and palpitations.  Gastrointestinal:  Negative for abdominal pain and heartburn.  Skin: Negative.  Negative for itching and rash.  Neurological:  Negative for dizziness and headaches.  Endo/Heme/Allergies:  Negative for environmental allergies. Does not bruise/bleed easily.       Objective:   Blood pressure (!) 140/70, pulse 92, temperature 98.2 F (36.8 C), temperature source Temporal, resp. rate 16, weight 284 lb 6.4 oz (129 kg), last menstrual period 03/06/2022, SpO2 98 %. Body mass index is 40.81 kg/m.    Physical Exam Vitals reviewed.  Constitutional:      Appearance: She is well-developed.  HENT:     Head: Normocephalic and atraumatic.     Right Ear: Tympanic membrane, ear canal and external ear normal.     Left Ear: Tympanic membrane, ear canal and external ear normal.     Nose: No nasal deformity, septal deviation, mucosal edema or rhinorrhea.     Right Turbinates: Not enlarged or swollen.     Left Turbinates: Not enlarged or swollen.      Right Sinus: No maxillary sinus tenderness or frontal sinus tenderness.     Left Sinus: No maxillary sinus tenderness or frontal sinus tenderness.     Mouth/Throat:     Mouth: Mucous membranes are not pale and not dry.     Pharynx: Uvula midline.  Eyes:     General: Lids are normal. No allergic shiner.       Right eye: No discharge.        Left eye: No discharge.     Conjunctiva/sclera: Conjunctivae normal.     Right eye: Right conjunctiva is not injected. No chemosis.    Left eye: Left conjunctiva is not injected. No chemosis.    Pupils: Pupils are equal, round, and reactive to light.  Cardiovascular:     Rate and Rhythm: Normal rate and regular rhythm.     Heart sounds: Normal heart sounds.  Pulmonary:     Effort: Pulmonary effort is normal. No tachypnea, accessory muscle  usage or respiratory distress.     Breath sounds: Normal breath sounds. No wheezing, rhonchi or rales.  Chest:     Chest wall: No tenderness.  Lymphadenopathy:     Cervical: No cervical adenopathy.  Skin:    Coloration: Skin is not pale.     Findings: No abrasion, erythema, petechiae or rash. Rash is not papular, urticarial or vesicular.  Neurological:     Mental Status: She is alert.  Psychiatric:        Behavior: Behavior is cooperative.      Diagnostic studies: none      Salvatore Marvel, MD  [facility]

## 2022-04-17 NOTE — Patient Instructions (Addendum)
1. Chronic rhinitis - Previous testing showed: grasses, ragweed, indoor molds, dust mites, dog, and cockroach. - I would be consistent with the nose sprays to see if this CAN prevent the need for antibiotics.  - Continue with: Zyrtec (cetirizine) '10mg'$  tablet once daily, Singulair (montelukast) '10mg'$  daily, Flonase (fluticasone) one spray per nostril daily (AIM FOR EAR ON EACH SIDE), and Astelin (azelastine) 2 sprays per nostril 1-2 times daily as needed - You can use an extra dose of the antihistamine, if needed, for breakthrough symptoms.  - Consider nasal saline rinses 1-2 times daily to remove allergens from the nasal cavities as well as help with mucous clearance (this is especially helpful to do before the nasal sprays are given) - We can start traditional allergy shots since that is likely covered best.  2. Return in about 6 months (around 10/18/2022).    Please inform us of any Emergency Department visits, hospitalizations, or changes in symptoms. Call us before going to the ED for breathing or allergy symptoms since we might be able to fit you in for a sick visit. Feel free to contact us anytime with any questions, problems, or concerns.  It was a pleasure to see you again today!  Websites that have reliable patient information: 1. American Academy of Asthma, Allergy, and Immunology: www.aaaai.org 2. Food Allergy Research and Education (FARE): foodallergy.org 3. Mothers of Asthmatics: http://www.asthmacommunitynetwork.org 4. American College of Allergy, Asthma, and Immunology: www.acaai.org   COVID-19 Vaccine Information can be found at: ShippingScam.co.uk For questions related to vaccine distribution or appointments, please email [email or web address] or call [phone].   We realize that you might be concerned about having an allergic reaction to the COVID19 vaccines. To help with that concern, WE ARE OFFERING THE COVID19  VACCINES IN OUR OFFICE! Ask the front desk for dates!     "Like" [name] on Facebook and Instagram for our latest updates!      A healthy democracy works best when [employer] participate! Make sure you are registered to vote! If you have moved or changed any of your contact information, you will need to get this updated before voting!  In some cases, you MAY be able to register to vote online: [email or web address]

## 2022-04-18 ENCOUNTER — Ambulatory Visit (HOSPITAL_BASED_OUTPATIENT_CLINIC_OR_DEPARTMENT_OTHER): Payer: BC Managed Care – PPO

## 2022-04-18 NOTE — Progress Notes (Signed)
Aeroallergen Immunotherapy   Ordering Provider: Dr. Salvatore Marvel   Patient Details  Name: Brandi Lester  MRN: [id number]  Date of Birth: 1966-08-08   Order 1 of 2   Vial Label: G/RW/D/DM   0.2 ml (Volume)  1:20 Concentration -- Johnson  0.4 ml (Volume)  1:20 Concentration -- Ragweed Mix  0.5 ml (Volume)  1:10 Concentration -- Dog Epithelia  0.7 ml (Volume)   AU Concentration -- Mite Mix (DF 5,000 & DP 5,000)    1.8  ml Extract Subtotal  3.2  ml Diluent  5.0  ml Maintenance Total   Schedule:  B   Blue Vial (1:100,000): Schedule B (6 doses)  Yellow Vial (1:10,000): Schedule B (6 doses)  Green Vial (1:1,000): Schedule B (6 doses)  Red Vial (1:100): Schedule B (6 doses)   Special Instructions: none

## 2022-04-18 NOTE — Progress Notes (Signed)
Aeroallergen Immunotherapy   Ordering Provider: Dr. Salvatore Marvel   Patient Details  Name: Corianna Avallone  MRN: [id number]  Date of Birth: [birth year]   Order 2 of 2   Vial Label: Molds/CR   0.2 ml (Volume)  1:10 Concentration -- Aspergillus mix  0.2 ml (Volume)  1:10 Concentration -- Penicillium mix  0.2 ml (Volume)  1:10 Concentration -- Fusarium moniliforme  0.2 ml (Volume)  1:40 Concentration -- Aureobasidium pullulans  0.2 ml (Volume)  1:10 Concentration -- Rhizopus oryzae  0.3 ml (Volume)  1:20 Concentration -- Cockroach, German    1.3  ml Extract Subtotal  3.7  ml Diluent  5.0  ml Maintenance Total   Schedule:  B   Blue Vial (1:100,000): Schedule B (6 doses)  Yellow Vial (1:10,000): Schedule B (6 doses)  Green Vial (1:1,000): Schedule B (6 doses)  Red Vial (1:100): Schedule B (6 doses)   Special Instructions: none

## 2022-04-18 NOTE — Progress Notes (Signed)
VIALS NOT MADE UNTIL APPT IS SCHED 

## 2022-04-23 NOTE — Progress Notes (Unsigned)
Chief Complaint:   OBESITY Brandi Lester is here to discuss her progress with her obesity treatment plan along with follow-up of her obesity related diagnoses. Brandi Lester is on the Category 4 Plan and states she is following her eating plan approximately 50% of the time. Brandi Lester states she is walking 120 minutes 5 times per week.  Today's visit was #: 69 Starting weight: 325 lbs Starting date: 05/23/2017 Today's weight: 280 lbs Today's date: 04/16/2022 Total lbs lost to date: 45 lbs Total lbs lost since last in-office visit: 1  Interim History: Brandi Lester had a 1 week vacation at [employer].  On 03/21/22, [name] 12.5 mg was replaced with Ozempic--titrated up to full 2 mg dose weekly. ***  Subjective:   1. Type 2 diabetes mellitus with other specified complication, unspecified whether long term insulin use (HCC) Brandi Lester's fasting blood sugars are 100-110's. PP< 140.  ***  2. Hyperlipidemia associated with type 2 diabetes mellitus ([place]) Brandi Lester denies myalgia with Lipitor 10 mg daily.  Assessment/Plan:   1. Type 2 diabetes mellitus with other specified complication, unspecified whether long term insulin use (HCC) We will refill Ozempic 2 mg once weekly for 1 month with 0 refills.  -Refill Semaglutide, 2 MG/DOSE, (OZEMPIC, 2 MG/DOSE,) 8 MG/3ML SOPN; Inject 2 mg into the skin once a week.  Dispense: 3 mL; Refill: 0  2. Hyperlipidemia associated with type 2 diabetes mellitus (HCC) We will refill Lipitor 10 mg daily at bedtime for 3 months with 0 refills.  -Reill atorvastatin (LIPITOR) 10 MG tablet; TAKE 1 TABLET BY MOUTH ONCE DAILY  Dispense: 90 tablet; Refill: 0  3. Obesity: BMI 40.2 Brandi Lester is currently in the action stage of change. As such, her goal is to continue with weight loss efforts. She has agreed to the Category 4 Plan.   Exercise goals: As is.  Behavioral modification strategies: increasing lean protein intake, decreasing simple carbohydrates, meal  planning and cooking strategies, keeping healthy foods in the home, and planning for success.  Brandi Lester has agreed to follow-up with our clinic in 4 weeks. She was informed of the importance of frequent follow-up visits to maximize her success with intensive lifestyle modifications for her multiple health conditions.   Objective:   Blood pressure 124/75, pulse 86, temperature 98 F (36.7 C), height '5\' 10"'$  (1.778 m), weight 280 lb (127 kg), last menstrual period 03/06/2022, SpO2 100 %. Body mass index is 40.18 kg/m.  General: Cooperative, alert, well developed, in no acute distress. HEENT: Conjunctivae and lids unremarkable. Cardiovascular: Regular rhythm.  Lungs: Normal work of breathing. Neurologic: No focal deficits.   Lab Results  Component Value Date   CREATININE 0.78 12/14/2021   BUN 11 12/14/2021   NA 138 12/14/2021   K 3.6 12/14/2021   CL 103 12/14/2021   CO2 27 12/14/2021   Lab Results  Component Value Date   ALT 10 12/14/2021   AST 10 12/14/2021   ALKPHOS 84 12/14/2021   BILITOT 0.3 12/14/2021   Lab Results  Component Value Date   HGBA1C 5.8 12/14/2021   HGBA1C 5.5 06/06/2021   HGBA1C 5.6 11/28/2020   HGBA1C 6.1 03/21/2020   HGBA1C 5.9 (H) 12/01/2019   Lab Results  Component Value Date   INSULIN 20.7 12/01/2019   INSULIN 12.9 07/07/2019   INSULIN 21.2 11/20/2018   INSULIN 15.4 07/14/2018   INSULIN 12.9 09/12/2017   Lab Results  Component Value Date   TSH 1.19 12/14/2021   Lab Results  Component Value Date  CHOL 111 12/14/2021   HDL 33.30 (L) 12/14/2021   LDLCALC 60 12/14/2021   TRIG 88.0 12/14/2021   CHOLHDL 3 12/14/2021   Lab Results  Component Value Date   VD25OH 90.29 12/14/2021   VD25OH 78.96 06/06/2021   VD25OH 78 11/28/2020   Lab Results  Component Value Date   WBC 6.6 12/14/2021   HGB 12.6 12/14/2021   HCT 37.3 12/14/2021   MCV 83.8 12/14/2021   PLT 300.0 12/14/2021   No results found for: "IRON", "TIBC",  "FERRITIN"  Attestation Statements:   Reviewed by clinician on day of visit: allergies, medications, problem list, medical history, surgical history, family history, social history, and previous encounter notes.  I, Daveah Varone, RMA, am acting as transcriptionist for Mina Marble, NP.  I have reviewed the above documentation for accuracy and completeness, and I agree with the above. -  ***

## 2022-04-25 ENCOUNTER — Ambulatory Visit: Payer: BC Managed Care – PPO | Admitting: Family Medicine

## 2022-04-27 ENCOUNTER — Ambulatory Visit (INDEPENDENT_AMBULATORY_CARE_PROVIDER_SITE_OTHER): Payer: BC Managed Care – PPO

## 2022-04-27 DIAGNOSIS — D259 Leiomyoma of uterus, unspecified: Secondary | ICD-10-CM

## 2022-04-27 DIAGNOSIS — D219 Benign neoplasm of connective and other soft tissue, unspecified: Secondary | ICD-10-CM

## 2022-05-03 ENCOUNTER — Other Ambulatory Visit (HOSPITAL_BASED_OUTPATIENT_CLINIC_OR_DEPARTMENT_OTHER): Payer: Self-pay

## 2022-05-14 ENCOUNTER — Other Ambulatory Visit (HOSPITAL_BASED_OUTPATIENT_CLINIC_OR_DEPARTMENT_OTHER): Payer: Self-pay

## 2022-05-14 ENCOUNTER — Telehealth (INDEPENDENT_AMBULATORY_CARE_PROVIDER_SITE_OTHER): Payer: BC Managed Care – PPO | Admitting: Adult Health

## 2022-05-14 ENCOUNTER — Other Ambulatory Visit: Payer: Self-pay | Admitting: Family Medicine

## 2022-05-14 DIAGNOSIS — E1169 Type 2 diabetes mellitus with other specified complication: Secondary | ICD-10-CM | POA: Diagnosis not present

## 2022-05-14 DIAGNOSIS — I152 Hypertension secondary to endocrine disorders: Secondary | ICD-10-CM

## 2022-05-14 DIAGNOSIS — E669 Obesity, unspecified: Secondary | ICD-10-CM | POA: Diagnosis not present

## 2022-05-14 DIAGNOSIS — R600 Localized edema: Secondary | ICD-10-CM

## 2022-05-14 DIAGNOSIS — Z7985 Long-term (current) use of injectable non-insulin antidiabetic drugs: Secondary | ICD-10-CM

## 2022-05-14 DIAGNOSIS — Z6841 Body Mass Index (BMI) 40.0 and over, adult: Secondary | ICD-10-CM

## 2022-05-14 MED ORDER — AMLODIPINE BESYLATE 10 MG PO TABS
10.0000 mg | ORAL_TABLET | Freq: Every day | ORAL | 1 refills | Status: DC
  Filled 2022-05-14: qty 90, 90d supply, fill #0

## 2022-05-14 MED ORDER — OZEMPIC (2 MG/DOSE) 8 MG/3ML ~~LOC~~ SOPN
2.0000 mg | PEN_INJECTOR | SUBCUTANEOUS | 0 refills | Status: DC
  Filled 2022-05-14: qty 3, 28d supply, fill #0

## 2022-05-14 MED ORDER — METFORMIN HCL 500 MG PO TABS
500.0000 mg | ORAL_TABLET | Freq: Two times a day (BID) | ORAL | 0 refills | Status: DC
  Filled 2022-05-14: qty 180, 90d supply, fill #0

## 2022-05-14 MED ORDER — HYDROCHLOROTHIAZIDE 25 MG PO TABS
25.0000 mg | ORAL_TABLET | Freq: Every day | ORAL | 1 refills | Status: DC
  Filled 2022-05-14: qty 90, 90d supply, fill #0

## 2022-05-17 ENCOUNTER — Encounter (INDEPENDENT_AMBULATORY_CARE_PROVIDER_SITE_OTHER): Payer: Self-pay | Admitting: Adult Health

## 2022-05-17 DIAGNOSIS — R600 Localized edema: Secondary | ICD-10-CM | POA: Insufficient documentation

## 2022-05-17 NOTE — Progress Notes (Unsigned)
TeleHealth Visit:  Due to the COVID-19 pandemic, this visit was completed with telemedicine (audio/video) technology to reduce patient and provider exposure as well as to preserve personal protective equipment.   Brandi Lester has verbally consented to this TeleHealth visit. The patient is located at home, the provider is located at the [facility] office. The participants in this visit include the listed provider and patient.  Chief Complaint: OBESITY Brandi Lester is here to discuss her progress with her obesity treatment plan along with follow-up of her obesity related diagnoses. Brandi Lester is on the Category 4 Plan and states she is following her eating plan approximately 75-80% of the time. M[place]enn[place] states she is walking and biking 30-45 minutes 4-5 times per week.  Today's visit was #: 45 Starting weight: 325 lbs Starting date: 05/23/2017  Interim History: She needed to convert from in office [facility] appt to [place] due to inability to make commute from office.  Today is her birthday!  Subjective:   1. Type 2 diabetes mellitus with other specified complication, without long-term current use of insulin (HCC)  On 03/21/22, Brandi Lester 12.5 mg was replaced with Ozempic--titrated up to full 2 mg dose weekly.  She denies mass in neck, dysphagia, dyspepsia, persistent hoarseness, abdominal pain, or N/V/Constipation.  2. Bilateral lower extremity edema She reports bilateral lower extremity edema.   L ankle worse than R ankle. She is taking HCTZ 25 mg daily.  She added Lasix '20mg'$ - 3 doses. She reports decrease in edema.  Assessment/Plan:   1. Type 2 diabetes mellitus with other specified complication, without long-term current use of insulin (HCC) Refill - Semaglutide, 2 MG/DOSE, (OZEMPIC, 2 MG/DOSE,) 8 MG/3ML SOPN; Inject 2 mg into the skin once a week.  Dispense: 3 mL; Refill: 0  Refill - metFORMIN (GLUCOPHAGE) 500 MG tablet; Take 1 tablet (500 mg total) by mouth 2 (two) times  daily with a meal.  Dispense: 180 tablet; Refill: 0  2. Bilateral lower extremity edema Elevate legs, compression socks.   Daily HCTZ, lasix sparingly.  3. Obesity: BMI 40.2 [name] is currently in the action stage of change. As such, her goal is to continue with weight loss efforts. She has agreed to the Category 4 Plan.   Exercise goals:  as is.  Behavioral modification strategies: increasing lean protein intake, decreasing simple carbohydrates, increasing water intake, meal planning and cooking strategies, keeping healthy foods in the home, and planning for success.  Brandi Lester has agreed to follow-up with our clinic in 4 weeks. She was informed of the importance of frequent follow-up visits to maximize her success with intensive lifestyle modifications for her multiple health conditions.  Objective:   VITALS: Per patient if applicable, see vitals. GENERAL: Alert and in no acute distress. CARDIOPULMONARY: No increased WOB. Speaking in clear sentences.  PSYCH: Pleasant and cooperative. Speech normal rate and rhythm. Affect is appropriate. Insight and judgement are appropriate. Attention is focused, linear, and appropriate.  NEURO: Oriented as arrived to appointment on time with no prompting.   Lab Results  Component Value Date   CREATININE 0.78 12/14/2021   BUN 11 12/14/2021   NA 138 12/14/2021   K 3.6 12/14/2021   CL 103 12/14/2021   CO2 27 12/14/2021   Lab Results  Component Value Date   ALT 10 12/14/2021   AST 10 12/14/2021   ALKPHOS 84 12/14/2021   BILITOT 0.3 12/14/2021   Lab Results  Component Value Date   HGBA1C 5.8 12/14/2021   HGBA1C 5.5 06/06/2021  HGBA1C 5.6 11/28/2020   HGBA1C 6.1 03/21/2020   HGBA1C 5.9 (H) 12/01/2019   Lab Results  Component Value Date   INSULIN 20.7 12/01/2019   INSULIN 12.9 07/07/2019   INSULIN 21.2 11/20/2018   INSULIN 15.4 07/14/2018   INSULIN 12.9 09/12/2017   Lab Results  Component Value Date   TSH 1.19 12/14/2021   Lab  Results  Component Value Date   CHOL 111 12/14/2021   HDL 33.30 (L) 12/14/2021   LDLCALC 60 12/14/2021   TRIG 88.0 12/14/2021   CHOLHDL 3 12/14/2021   Lab Results  Component Value Date   VD25OH 90.29 12/14/2021   VD25OH 78.96 06/06/2021   VD25OH 78 11/28/2020   Lab Results  Component Value Date   WBC 6.6 12/14/2021   HGB 12.6 12/14/2021   HCT 37.3 12/14/2021   MCV 83.8 12/14/2021   PLT 300.0 12/14/2021   No results found for: "IRON", "TIBC", "FERRITIN"  Attestation Statements:   Reviewed by clinician on day of visit: allergies, medications, problem list, medical history, surgical history, family history, social history, and previous encounter notes.  Time spent on visit including pre-visit chart review and post-visit charting and care was 20 minutes.   I, Davy Pique, RMA, am acting as Location manager for Mina Marble, NP.  I have reviewed the above documentation for accuracy and completeness, and I agree with the above. - Katy d. Danford, NP-C

## 2022-05-25 ENCOUNTER — Other Ambulatory Visit (HOSPITAL_BASED_OUTPATIENT_CLINIC_OR_DEPARTMENT_OTHER): Payer: Self-pay

## 2022-05-25 ENCOUNTER — Other Ambulatory Visit: Payer: Self-pay | Admitting: Family Medicine

## 2022-05-25 DIAGNOSIS — E1159 Type 2 diabetes mellitus with other circulatory complications: Secondary | ICD-10-CM

## 2022-05-25 MED ORDER — LOSARTAN POTASSIUM 100 MG PO TABS
100.0000 mg | ORAL_TABLET | Freq: Every day | ORAL | 1 refills | Status: DC
  Filled 2022-05-25: qty 90, 90d supply, fill #0

## 2022-06-11 ENCOUNTER — Encounter (INDEPENDENT_AMBULATORY_CARE_PROVIDER_SITE_OTHER): Payer: Self-pay | Admitting: Adult Health

## 2022-06-11 ENCOUNTER — Ambulatory Visit (INDEPENDENT_AMBULATORY_CARE_PROVIDER_SITE_OTHER): Payer: BC Managed Care – PPO | Admitting: Adult Health

## 2022-06-11 ENCOUNTER — Other Ambulatory Visit (HOSPITAL_BASED_OUTPATIENT_CLINIC_OR_DEPARTMENT_OTHER): Payer: Self-pay

## 2022-06-11 VITALS — BP 119/75 | HR 88 | Temp 98.6°F | Ht 70.0 in | Wt 278.0 lb

## 2022-06-11 DIAGNOSIS — E669 Obesity, unspecified: Secondary | ICD-10-CM

## 2022-06-11 DIAGNOSIS — F3289 Other specified depressive episodes: Secondary | ICD-10-CM

## 2022-06-11 DIAGNOSIS — Z6841 Body Mass Index (BMI) 40.0 and over, adult: Secondary | ICD-10-CM | POA: Diagnosis not present

## 2022-06-11 DIAGNOSIS — E1169 Type 2 diabetes mellitus with other specified complication: Secondary | ICD-10-CM

## 2022-06-11 DIAGNOSIS — Z7985 Long-term (current) use of injectable non-insulin antidiabetic drugs: Secondary | ICD-10-CM

## 2022-06-11 MED ORDER — OZEMPIC (2 MG/DOSE) 8 MG/3ML ~~LOC~~ SOPN
2.0000 mg | PEN_INJECTOR | SUBCUTANEOUS | 2 refills | Status: DC
  Filled 2022-06-11: qty 3, 28d supply, fill #0
  Filled 2022-07-11: qty 3, 28d supply, fill #1
  Filled 2022-08-04: qty 3, 28d supply, fill #2

## 2022-06-11 MED ORDER — BUPROPION HCL ER (SR) 200 MG PO TB12
200.0000 mg | ORAL_TABLET | Freq: Every morning | ORAL | 0 refills | Status: DC
  Filled 2022-06-11: qty 90, 90d supply, fill #0

## 2022-06-13 ENCOUNTER — Other Ambulatory Visit (HOSPITAL_BASED_OUTPATIENT_CLINIC_OR_DEPARTMENT_OTHER): Payer: Self-pay

## 2022-06-17 NOTE — Assessment & Plan Note (Addendum)
hgba1c acceptable, minimize simple carbs. Increase exercise as tolerated. Continue current meds. Recommend flu and COVID boosters, flu shot today, covid booster in 2 weeks

## 2022-06-17 NOTE — Assessment & Plan Note (Signed)
No recent symptomatic episodes of PVCs and PACs

## 2022-06-17 NOTE — Progress Notes (Unsigned)
Subjective:    Patient ID: Brandi Lester, female    DOB: [birth year], 56 y.o.   MRN: [id number]  No chief complaint on file.   HPI Patient is in today for follow up on chronic medical concerns. No recent febrile illness or acute hospitalizations.   Past Medical History:  Diagnosis Date   ADD (attention deficit disorder)    Allergy    allergic rhinitis   Anemia 07/25/2013   Angio-edema    Anxiety    Arthritis    not dx'd   Back pain    Costochondritis 02/20/2015   Diabetes mellitus    Type II   previously 3 years ago - no longer on meds    Dyslipidemia 07/25/2013   Fungus infection 06/2012   Left great toe   GERD (gastroesophageal reflux disease)    Hypertension    IBS (irritable bowel syndrome)    Ingrown nail 06/2012   right foot next to the last toe   Insomnia 04/20/2013   Joint pain    Lactose intolerance    Leg edema    Obesity    Pedal edema 02/20/2015   Prediabetes    Preventative health care 09/25/2015   PVC (premature ventricular contraction)    Recurrent upper respiratory infection (URI)    Urticaria     Past Surgical History:  Procedure Laterality Date   HYSTEROSCOPY N/A 10/04/2015   Procedure: HYSTEROSCOPY with Removal IUD;  Surgeon: Vanessa Kick, MD;  Location: [facility];  Service: Gynecology;  Laterality: N/A;   lasik     eye surgery   WISDOM TOOTH EXTRACTION      Family History  Problem Relation Age of Onset   Allergic rhinitis Mother    Heart attack Mother 48   Hypertension Mother    Heart disease Mother    Obesity Mother    Cancer Father        prostate   Prostate cancer Father    Allergic rhinitis Sister    Heart attack Sister 4   Allergic rhinitis Sister    Allergic rhinitis Sister    Allergic rhinitis Maternal Grandmother    Hypertension Other    Colon cancer Neg Hx    Colon polyps Neg Hx    Esophageal cancer Neg Hx    Stomach cancer Neg Hx    Rectal cancer Neg Hx     Social History   Socioeconomic History    Marital status: Single    Spouse name: Not on file   Number of children: Not on file   Years of education: Not on file   Highest education level: Not on file  Occupational History   Occupation: Pharmacist, hospital, [employer] manager  Tobacco Use   Smoking status: Former    Packs/day: 1.00    Years: 15.00    Total pack years: 15.00    Types: Cigarettes    Quit date: 09/03/2001    Years since quitting: 20.8    Passive exposure: Never   Smokeless tobacco: Never  Vaping Use   Vaping Use: Never used  Substance and Sexual Activity   Alcohol use: Yes    Comment: rare   Drug use: No   Sexual activity: Yes    Birth control/protection: None  Other Topics Concern   Not on file  Social History Narrative   Not on file   Social Determinants of Health   Financial Resource Strain: Not on file  Food Insecurity: Not on file  Transportation Needs: Not on file  Physical Activity: Not on file  Stress: Not on file  Social Connections: Not on file  Intimate Partner Violence: Not on file    Outpatient Medications Prior to Visit  Medication Sig Dispense Refill   acetaminophen (TYLENOL 8 HOUR) 650 MG CR tablet Take 1 tablet (650 mg total) every 8 (eight) hours as needed by mouth for pain.     amLODipine (NORVASC) 10 MG tablet Take 1 tablet (10 mg total) by mouth daily. 90 tablet 1   atorvastatin (LIPITOR) 10 MG tablet TAKE 1 TABLET BY MOUTH ONCE DAILY 90 tablet 0   azelastine (ASTELIN) 0.1 % nasal spray Place 2 sprays into both nostrils 2 (two) times daily. 30 mL 5   buPROPion (WELLBUTRIN SR) 200 MG 12 hr tablet Take 1 tablet (200 mg total) by mouth in the morning. 90 tablet 0   carvedilol (COREG) 25 MG tablet Take 1 tablet (25 mg total) by mouth 2 (two) times daily. 180 tablet 1   cholecalciferol (VITAMIN D3) 25 MCG (1000 UT) tablet Take 4,000 Units by mouth daily.     EPINEPHrine (EPIPEN 2-PAK) 0.3 mg/0.3 mL IJ SOAJ injection Use as directed for severe allergic reaction 2 each 1   fluticasone (FLONASE) 50  MCG/ACT nasal spray Place 2 sprays into both nostrils daily as needed for allergies. 16 g 5   furosemide (LASIX) 20 MG tablet Take 1 tablet (20 mg total) by mouth daily as needed for edema (weight gain of >3 lbs/24 hours). 30 tablet 3   hydrochlorothiazide (HYDRODIURIL) 25 MG tablet Take 1 tablet (25 mg total) by mouth daily. 90 tablet 1   ibuprofen (ADVIL,MOTRIN) 600 MG tablet Take 1 tablet (600 mg total) by mouth every 6 (six) hours as needed. 90 tablet 0   losartan (COZAAR) 100 MG tablet Take 1 tablet (100 mg total) by mouth daily. 90 tablet 1   medroxyPROGESTERone (PROVERA) 10 MG tablet TAKE 1 TABLET (10 MG TOTAL) BY MOUTH DAILY. 90 tablet 3   metFORMIN (GLUCOPHAGE) 500 MG tablet Take 1 tablet (500 mg total) by mouth 2 (two) times daily with a meal. 180 tablet 0   montelukast (SINGULAIR) 10 MG tablet Take 1 tablet (10 mg total) by mouth at bedtime as needed. 90 tablet 1   Omega-3 Fatty Acids (FISH OIL PO) Take 1 capsule by mouth daily.     pantoprazole (PROTONIX) 40 MG tablet TAKE 1 TABLET BY MOUTH ONCE DAILY 90 tablet 1   potassium chloride SA (KLOR-CON M) 20 MEQ tablet TAKE 1 TABLET (20 MEQ TOTAL) BY MOUTH 3 (THREE) TIMES DAILY. 270 tablet 1   Probiotic Product (PROBIOTIC-10 PO) Take by mouth as needed.     Semaglutide, 2 MG/DOSE, (OZEMPIC, 2 MG/DOSE,) 8 MG/3ML SOPN Inject 2 mg into the skin once a week. 3 mL 2   TURMERIC PO Take 1 capsule by mouth daily.     azithromycin (ZITHROMAX) 250 MG tablet Take 2 tablets by mouth today then take 1 tablet everyday for 4 days 6 tablet 0   COVID-19 mRNA bivalent vaccine, Moderna, (MODERNA COVID-19 BIVAL BOOSTER) 50 MCG/0.5ML injection Inject into the muscle. 0.5 mL 0   COVID-19 mRNA Vac-TriS, Pfizer, SUSP injection Inject into the muscle. 0.3 mL 0   No facility-administered medications prior to visit.    Allergies  Allergen Reactions   Penicillins Itching and Swelling    Has patient had a PCN reaction causing immediate rash, facial/tongue/throat  swelling, SOB or lightheadedness with hypotension: yes Has patient had a PCN reaction  causing severe rash involving mucus membranes or skin necrosis: no Has patient had a PCN reaction that required hospitalization no Has patient had a PCN reaction occurring within the last 10 years: yes If all of the above answers are "NO", then may proceed with Cephalosporin use.    Victoza [Liraglutide]     Vomiting    Review of Systems  Constitutional:  Negative for fever and malaise/fatigue.  HENT:  Negative for congestion.   Eyes:  Negative for blurred vision.  Respiratory:  Negative for shortness of breath.   Cardiovascular:  Negative for chest pain, palpitations and leg swelling.  Gastrointestinal:  Negative for abdominal pain, blood in stool and nausea.  Genitourinary:  Negative for dysuria and frequency.  Musculoskeletal:  Negative for falls.  Skin:  Negative for rash.  Neurological:  Negative for dizziness, loss of consciousness and headaches.  Endo/Heme/Allergies:  Negative for environmental allergies.  Psychiatric/Behavioral:  Negative for depression. The patient is not nervous/anxious.        Objective:    Physical Exam Constitutional:      General: She is not in acute distress.    Appearance: She is well-developed.  HENT:     Head: Normocephalic and atraumatic.  Eyes:     Conjunctiva/sclera: Conjunctivae normal.  Neck:     Thyroid: No thyromegaly.  Cardiovascular:     Rate and Rhythm: Normal rate and regular rhythm.     Heart sounds: Normal heart sounds. No murmur heard. Pulmonary:     Effort: Pulmonary effort is normal. No respiratory distress.     Breath sounds: Normal breath sounds.  Abdominal:     General: Bowel sounds are normal. There is no distension.     Palpations: Abdomen is soft. There is no mass.     Tenderness: There is no abdominal tenderness.  Musculoskeletal:     Cervical back: Neck supple.  Lymphadenopathy:     Cervical: No cervical adenopathy.   Skin:    General: Skin is warm and dry.  Neurological:     Mental Status: She is alert and oriented to person, place, and time.  Psychiatric:        Behavior: Behavior normal.     There were no vitals taken for this visit. Wt Readings from Last 3 Encounters:  06/11/22 278 lb (126.1 kg)  04/17/22 284 lb 6.4 oz (129 kg)  04/16/22 280 lb (127 kg)    Diabetic Foot Exam - Simple   No data filed    Lab Results  Component Value Date   WBC 6.6 12/14/2021   HGB 12.6 12/14/2021   HCT 37.3 12/14/2021   PLT 300.0 12/14/2021   GLUCOSE 70 12/14/2021   CHOL 111 12/14/2021   TRIG 88.0 12/14/2021   HDL 33.30 (L) 12/14/2021   LDLCALC 60 12/14/2021   ALT 10 12/14/2021   AST 10 12/14/2021   NA 138 12/14/2021   K 3.6 12/14/2021   CL 103 12/14/2021   CREATININE 0.78 12/14/2021   BUN 11 12/14/2021   CO2 27 12/14/2021   TSH 1.19 12/14/2021   HGBA1C 5.8 12/14/2021   MICROALBUR 0.50 12/15/2012    Lab Results  Component Value Date   TSH 1.19 12/14/2021   Lab Results  Component Value Date   WBC 6.6 12/14/2021   HGB 12.6 12/14/2021   HCT 37.3 12/14/2021   MCV 83.8 12/14/2021   PLT 300.0 12/14/2021   Lab Results  Component Value Date   NA 138 12/14/2021   K 3.6 12/14/2021  CO2 27 12/14/2021   GLUCOSE 70 12/14/2021   BUN 11 12/14/2021   CREATININE 0.78 12/14/2021   BILITOT 0.3 12/14/2021   ALKPHOS 84 12/14/2021   AST 10 12/14/2021   ALT 10 12/14/2021   PROT 7.0 12/14/2021   ALBUMIN 4.2 12/14/2021   CALCIUM 9.5 12/14/2021   GFR 86.61 12/14/2021   Lab Results  Component Value Date   CHOL 111 12/14/2021   Lab Results  Component Value Date   HDL 33.30 (L) 12/14/2021   Lab Results  Component Value Date   LDLCALC 60 12/14/2021   Lab Results  Component Value Date   TRIG 88.0 12/14/2021   Lab Results  Component Value Date   CHOLHDL 3 12/14/2021   Lab Results  Component Value Date   HGBA1C 5.8 12/14/2021       Assessment & Plan:   Problem List Items  Addressed This Visit     Diabetes mellitus type 2 in obese ([place])    hgba1c acceptable, minimize simple carbs. Increase exercise as tolerated. Continue current meds. Recommend flu and COVID boosters      Hyperlipidemia associated with type 2 diabetes mellitus ([place])    Encourage heart healthy diet such as MIND or DASH diet, increase exercise, avoid trans fats, simple carbohydrates and processed foods, consider a krill or fish or flaxseed oil cap daily. Tolerating Atorvastatin      Vitamin D deficiency    Supplement and monitor      High serum vitamin B12    monitor      Hypertension associated with type 2 diabetes mellitus ([place])    Well controlled, no changes to meds. Encouraged heart healthy diet such as the DASH diet and exercise as tolerated.       Palpitations    No recent symptomatic episodes of PVCs and PACs      Class 3 severe obesity with serious comorbidity and body mass index (BMI) of 45.0 to 49.9 in adult [facility])    Encouraged DASH or MIND diet, decrease po intake and increase exercise as tolerated. Needs 7-8 hours of sleep nightly. Avoid trans fats, eat small, frequent meals every 4-5 hours with lean proteins, complex carbs and healthy fats. Minimize simple carbs, high fat foods and processed foods       I have discontinued Aujanae T. Missildine's COVID-19 mRNA Vac-TriS Therapist, music), [employer], and azithromycin. I am also having her maintain her TURMERIC PO, Omega-3 Fatty Acids (FISH OIL PO), ibuprofen, Probiotic Product (PROBIOTIC-10 PO), acetaminophen, cholecalciferol, furosemide, montelukast, potassium chloride SA, EPINEPHrine, carvedilol, pantoprazole, medroxyPROGESTERone, atorvastatin, fluticasone, azelastine, hydrochlorothiazide, amLODipine, metFORMIN, losartan, Ozempic (2 MG/DOSE), and buPROPion.  No orders of the defined types were placed in this encounter.    Penni Homans, MD

## 2022-06-17 NOTE — Assessment & Plan Note (Signed)
Encouraged DASH or MIND diet, decrease po intake and increase exercise as tolerated. Needs 7-8 hours of sleep nightly. Avoid trans fats, eat small, frequent meals every 4-5 hours with lean proteins, complex carbs and healthy fats. Minimize simple carbs, high fat foods and processed foods 

## 2022-06-17 NOTE — Assessment & Plan Note (Signed)
Encourage heart healthy diet such as MIND or DASH diet, increase exercise, avoid trans fats, simple carbohydrates and processed foods, consider a krill or fish or flaxseed oil cap daily. Tolerating Atorvastatin 

## 2022-06-17 NOTE — Assessment & Plan Note (Signed)
monitor

## 2022-06-17 NOTE — Assessment & Plan Note (Signed)
Well controlled, no changes to meds. Encouraged heart healthy diet such as the DASH diet and exercise as tolerated.  °

## 2022-06-17 NOTE — Assessment & Plan Note (Signed)
Supplement and monitor 

## 2022-06-18 ENCOUNTER — Other Ambulatory Visit (HOSPITAL_BASED_OUTPATIENT_CLINIC_OR_DEPARTMENT_OTHER): Payer: Self-pay

## 2022-06-18 ENCOUNTER — Ambulatory Visit: Payer: BC Managed Care – PPO | Admitting: Family Medicine

## 2022-06-18 VITALS — BP 128/78 | HR 91 | Temp 98.0°F | Resp 16 | Ht 69.0 in | Wt 285.0 lb

## 2022-06-18 DIAGNOSIS — E1169 Type 2 diabetes mellitus with other specified complication: Secondary | ICD-10-CM

## 2022-06-18 DIAGNOSIS — R7989 Other specified abnormal findings of blood chemistry: Secondary | ICD-10-CM

## 2022-06-18 DIAGNOSIS — Z23 Encounter for immunization: Secondary | ICD-10-CM

## 2022-06-18 DIAGNOSIS — I152 Hypertension secondary to endocrine disorders: Secondary | ICD-10-CM | POA: Diagnosis not present

## 2022-06-18 DIAGNOSIS — R002 Palpitations: Secondary | ICD-10-CM

## 2022-06-18 DIAGNOSIS — E785 Hyperlipidemia, unspecified: Secondary | ICD-10-CM

## 2022-06-18 DIAGNOSIS — E1159 Type 2 diabetes mellitus with other circulatory complications: Secondary | ICD-10-CM | POA: Diagnosis not present

## 2022-06-18 DIAGNOSIS — E669 Obesity, unspecified: Secondary | ICD-10-CM | POA: Diagnosis not present

## 2022-06-18 DIAGNOSIS — J069 Acute upper respiratory infection, unspecified: Secondary | ICD-10-CM

## 2022-06-18 DIAGNOSIS — Z6841 Body Mass Index (BMI) 40.0 and over, adult: Secondary | ICD-10-CM

## 2022-06-18 DIAGNOSIS — E559 Vitamin D deficiency, unspecified: Secondary | ICD-10-CM

## 2022-06-18 LAB — COMPREHENSIVE METABOLIC PANEL
ALT: 10 U/L (ref 0–35)
AST: 9 U/L (ref 0–37)
Albumin: 4 g/dL (ref 3.5–5.2)
Alkaline Phosphatase: 74 U/L (ref 39–117)
BUN: 8 mg/dL (ref 6–23)
CO2: 27 mEq/L (ref 19–32)
Calcium: 9 mg/dL (ref 8.4–10.5)
Chloride: 102 mEq/L (ref 96–112)
Creatinine, Ser: 0.61 mg/dL (ref 0.40–1.20)
GFR: 101.59 mL/min (ref >60.00)
Glucose, Bld: 87 mg/dL (ref 70–99)
Potassium: 3.4 mEq/L — ABNORMAL LOW (ref 3.5–5.1)
Sodium: 139 mEq/L (ref 135–145)
Total Bilirubin: 0.4 mg/dL (ref 0.2–1.2)
Total Protein: 6.7 g/dL (ref 6.0–8.3)

## 2022-06-18 LAB — CBC
HCT: 36.2 % (ref 36.0–46.0)
Hemoglobin: 12.8 g/dL (ref 12.0–15.0)
MCHC: 35.2 g/dL (ref 30.0–36.0)
MCV: 86.9 fl (ref 78.0–100.0)
Platelets: 263 10*3/uL (ref 150.0–400.0)
RBC: 4.17 Mil/uL (ref 3.87–5.11)
RDW: 14.2 % (ref 11.5–15.5)
WBC: 5.2 10*3/uL (ref 4.0–10.5)

## 2022-06-18 LAB — LIPID PANEL
Cholesterol: 100 mg/dL (ref 0–200)
HDL: 32.4 mg/dL — ABNORMAL LOW (ref >39.00)
LDL Cholesterol: 53 mg/dL (ref 0–99)
NonHDL: 67.6
Total CHOL/HDL Ratio: 3
Triglycerides: 73 mg/dL (ref 0.0–149.0)
VLDL: 14.6 mg/dL (ref 0.0–40.0)

## 2022-06-18 LAB — TSH: TSH: 1.18 u[IU]/mL (ref 0.35–5.50)

## 2022-06-18 LAB — MICROALBUMIN / CREATININE URINE RATIO
Creatinine,U: 253.1 mg/dL
Microalb Creat Ratio: 1.5 mg/g (ref 0.0–30.0)
Microalb, Ur: 3.7 mg/dL — ABNORMAL HIGH (ref 0.0–1.9)

## 2022-06-18 LAB — VITAMIN B12: Vitamin B-12: 339 pg/mL (ref 211–911)

## 2022-06-18 LAB — VITAMIN D 25 HYDROXY (VIT D DEFICIENCY, FRACTURES): VITD: 68.76 ng/mL (ref 30.00–100.00)

## 2022-06-18 LAB — HEMOGLOBIN A1C: Hgb A1c MFr Bld: 5.7 % (ref 4.6–6.5)

## 2022-06-18 MED ORDER — AZITHROMYCIN 250 MG PO TABS
ORAL_TABLET | ORAL | 0 refills | Status: AC
  Filled 2022-06-18: qty 6, 5d supply, fill #0

## 2022-06-18 MED ORDER — BENZONATATE 100 MG PO CAPS
100.0000 mg | ORAL_CAPSULE | Freq: Three times a day (TID) | ORAL | 1 refills | Status: DC | PRN
  Filled 2022-06-18: qty 40, 7d supply, fill #0
  Filled 2022-10-24: qty 40, 7d supply, fill #1

## 2022-06-18 MED ORDER — ALBUTEROL SULFATE HFA 108 (90 BASE) MCG/ACT IN AERS
2.0000 | INHALATION_SPRAY | Freq: Four times a day (QID) | RESPIRATORY_TRACT | 1 refills | Status: DC | PRN
  Filled 2022-06-18: qty 6.7, 25d supply, fill #0

## 2022-06-18 NOTE — Assessment & Plan Note (Signed)
She has been ill for about a week. With cough generally productive of clear sputum although it is green in am. She notes nasal congestion and mild headache as well but denies fevers, chills, myalgias, ear pain or sore throat. Has been taking Mucinex 1200 mg bid and will add Tessalon Pereles and Albuterol prn, if symptoms worsen can take Azithromycin

## 2022-06-18 NOTE — Progress Notes (Signed)
Chief Complaint:   OBESITY Brandi Lester is here to discuss her progress with her obesity treatment plan along with follow-up of her obesity related diagnoses. Brandi Lester is on the Category 4 Plan and states she is following her eating plan approximately 75% of the time. [name] states she is walking and biking 30-45 minutes 3-4 times per week.  Today's visit was #: 85 Starting weight: 325 lbs Starting date: 05/23/2017 Today's weight: 278 lbs Today's date: 06/11/2022 Total lbs lost to date: 47 lbs Total lbs lost since last in-office visit: 2 lbs  Interim History:  03/21/2022, [name] 12.5 mg replaced with Ozempic, titrated up 2 mg max dose.  She provided the following intake that is typical of a day: Breakfast:  protein shake Lunch:  microwave meal or meat sandwich Dinner:  meat protein  Subjective:   1. Type 2 diabetes mellitus with other specified complication, without long-term current use of insulin (HCC) Lab Results  Component Value Date   HGBA1C 5.7 06/18/2022   HGBA1C 5.8 12/14/2021   HGBA1C 5.5 06/06/2021  03/21/2022, [name] 12.5 mg replaced with Ozempic, titrated up 2 mg max dose.  She denies mass in neck, dysphagia, dyspepsia, persistent hoarseness, abdominal pain, or N/V/Constipation.  2. Other depression, with emotional eating Blood pressure and heart rate goal at office visit.  She endorses stable mood and denies any suicidal or homicidal ideations.   She is on Buproprion SR 200 mg daily.   Assessment/Plan:   1. Type 2 diabetes mellitus with other specified complication, without long-term current use of insulin (HCC) Refill - Semaglutide, 2 MG/DOSE, (OZEMPIC, 2 MG/DOSE,) 8 MG/3ML SOPN; Inject 2 mg into the skin once a week.  Dispense: 3 mL; Refill: 2  2. Other depression, with emotional eating Refill - buPROPion (WELLBUTRIN SR) 200 MG 12 hr tablet; Take 1 tablet (200 mg total) by mouth in the morning.  Dispense: 90 tablet; Refill: 0  3. Obesity: BMI  40.0 [name] is currently in the action stage of change. As such, her goal is to continue with weight loss efforts. She has agreed to the Category 4 Plan.   Exercise goals:  As is.   Behavioral modification strategies: increasing lean protein intake, decreasing simple carbohydrates, meal planning and cooking strategies, keeping healthy foods in the home, and planning for success.  Brandi Lester has agreed to follow-up with our clinic in 4 weeks. She was informed of the importance of frequent follow-up visits to maximize her success with intensive lifestyle modifications for her multiple health conditions.   Objective:   Blood pressure 119/75, pulse 88, temperature 98.6 F (37 C), height 5\' 10"  (1.778 m), weight 278 lb (126.1 kg), SpO2 100 %. Body mass index is 39.89 kg/m.  General: Cooperative, alert, well developed, in no acute distress. HEENT: Conjunctivae and lids unremarkable. Cardiovascular: Regular rhythm.  Lungs: Normal work of breathing. Neurologic: No focal deficits.   Lab Results  Component Value Date   CREATININE 0.78 12/14/2021   BUN 11 12/14/2021   NA 138 12/14/2021   K 3.6 12/14/2021   CL 103 12/14/2021   CO2 27 12/14/2021   Lab Results  Component Value Date   ALT 10 12/14/2021   AST 10 12/14/2021   ALKPHOS 84 12/14/2021   BILITOT 0.3 12/14/2021   Lab Results  Component Value Date   HGBA1C 5.8 12/14/2021   HGBA1C 5.5 06/06/2021   HGBA1C 5.6 11/28/2020   HGBA1C 6.1 03/21/2020   HGBA1C 5.9 (H) 12/01/2019   Lab Results  Component  Value Date   INSULIN 20.7 12/01/2019   INSULIN 12.9 07/07/2019   INSULIN 21.2 11/20/2018   INSULIN 15.4 07/14/2018   INSULIN 12.9 09/12/2017   Lab Results  Component Value Date   TSH 1.19 12/14/2021   Lab Results  Component Value Date   CHOL 111 12/14/2021   HDL 33.30 (L) 12/14/2021   LDLCALC 60 12/14/2021   TRIG 88.0 12/14/2021   CHOLHDL 3 12/14/2021   Lab Results  Component Value Date   VD25OH 90.29 12/14/2021    VD25OH 78.96 06/06/2021   VD25OH 78 11/28/2020   Lab Results  Component Value Date   WBC 6.6 12/14/2021   HGB 12.6 12/14/2021   HCT 37.3 12/14/2021   MCV 83.8 12/14/2021   PLT 300.0 12/14/2021   No results found for: "IRON", "TIBC", "FERRITIN"  Attestation Statements:   Reviewed by clinician on day of visit: allergies, medications, problem list, medical history, surgical history, family history, social history, and previous encounter notes.  I, Malcolm Metro, RMA, am acting as Energy manager for William Hamburger, NP.  I have reviewed the above documentation for accuracy and completeness, and I agree with the above. - Jerzie Bieri d. Candace Ramus, NP-C

## 2022-06-18 NOTE — Patient Instructions (Signed)

## 2022-06-19 ENCOUNTER — Encounter: Payer: Self-pay | Admitting: Family Medicine

## 2022-06-19 ENCOUNTER — Other Ambulatory Visit: Payer: Self-pay

## 2022-06-19 DIAGNOSIS — E1169 Type 2 diabetes mellitus with other specified complication: Secondary | ICD-10-CM

## 2022-07-01 ENCOUNTER — Other Ambulatory Visit: Payer: Self-pay | Admitting: Family Medicine

## 2022-07-01 DIAGNOSIS — I10 Essential (primary) hypertension: Secondary | ICD-10-CM

## 2022-07-01 DIAGNOSIS — E876 Hypokalemia: Secondary | ICD-10-CM

## 2022-07-01 DIAGNOSIS — T7840XA Allergy, unspecified, initial encounter: Secondary | ICD-10-CM

## 2022-07-02 ENCOUNTER — Other Ambulatory Visit (HOSPITAL_BASED_OUTPATIENT_CLINIC_OR_DEPARTMENT_OTHER): Payer: Self-pay

## 2022-07-02 MED ORDER — POTASSIUM CHLORIDE CRYS ER 20 MEQ PO TBCR
20.0000 meq | EXTENDED_RELEASE_TABLET | Freq: Three times a day (TID) | ORAL | 1 refills | Status: DC
  Filled 2022-07-02: qty 270, 90d supply, fill #0
  Filled 2022-09-29: qty 270, 90d supply, fill #1

## 2022-07-09 ENCOUNTER — Encounter: Payer: Self-pay | Admitting: Family Medicine

## 2022-07-10 NOTE — Progress Notes (Signed)
TeleHealth Visit:  This visit was completed with telemedicine (audio/video) technology. Brandi Lester has verbally consented to this TeleHealth visit. The patient is located at home, the provider is located at home. The participants in this visit include the listed provider and patient. The visit was conducted today via MyChart video.  OBESITY Brandi Lester is here to discuss her progress with her obesity treatment plan along with follow-up of her obesity related diagnoses.   Today's visit was # 86 Starting weight: 325 lbs Starting date: 05/23/2017 Weight at last in office visit: 278 lbs on 06/11/22 Total weight loss: 47 lbs at last in office visit on 06/11/22. Today's reported weight:  No weight reported.  Nutrition Plan: the Category 4 Plan.   Current exercise:  walking and biking 30-45 minutes 3-4 times per week.  Interim History: [name] reports she is following the category 4 plan consistently.  She is also doing a good job with her exercise.   She generally has a protein shake for breakfast.  She packs her lunch but sometimes does not get to eat until very late.  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. Water intake is good. She has had some nausea related to the Ozempic recently.  She says she did not have side effects like that with the [facility].  Assessment/Plan:  1. Nausea Has had a few episodes of nausea with vomiting since switching from [facility] to Ozempic.  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  She asked for refill of Zofran.  Plan: Refill Zofran-ODT 4 mg every 8 hours as needed for nausea. Her insurance will cover [facility] and we discussed switching back to [facility] when she needs her next refill.  2.  Hyperlipidemia associated with type 2 diabetes Hyperlipidemia LDL is at goal.  LDL currently less than 70-it was 53 on 06/18/2022.  HDL low at 32.  Triglycerides normal at 73. Medication(s): Atorvastatin 10 mg daily. Cardiovascular risk factors: diabetes  mellitus, dyslipidemia, hypertension, and obesity (BMI >= 30 kg/m2)  Lab Results  Component Value Date   CHOL 100 06/18/2022   HDL 32.40 (L) 06/18/2022   LDLCALC 53 06/18/2022   TRIG 73.0 06/18/2022   CHOLHDL 3 06/18/2022   Lab Results  Component Value Date   ALT 10 06/18/2022   AST 9 06/18/2022   ALKPHOS 74 06/18/2022   BILITOT 0.4 06/18/2022   The ASCVD Risk score (Arnett DK, et al., 2019) failed to calculate for the following reasons:   The valid total cholesterol range is 130 to 320 mg/dL  Plan: Refill atorvastatin 10 mg daily.  3. Obesity: Current BMI 39.9 Brandi Lester is currently in the action stage of change. As such, her goal is to continue with weight loss efforts.  She has agreed to the Category 4 Plan.   Exercise goals:  as is  Behavioral modification strategies: increasing lean protein intake, decreasing simple carbohydrates, meal planning and cooking strategies, and planning for success.  [name] has agreed to follow-up with our clinic in 4 weeks.   No orders of the defined types were placed in this encounter.   Medications Discontinued During This Encounter  Medication Reason   atorvastatin (LIPITOR) 10 MG tablet Reorder     Meds ordered this encounter  Medications   ondansetron (ZOFRAN-ODT) 4 MG disintegrating tablet    Sig: Take 1 tablet (4 mg total) by mouth every 8 (eight) hours as needed for nausea or vomiting.    Dispense:  20 tablet    Refill:  0  Order Specific Question:   Supervising Provider    Answer:   Glennis Brink [[id number]]   atorvastatin (LIPITOR) 10 MG tablet    Sig: TAKE 1 TABLET BY MOUTH ONCE DAILY    Dispense:  90 tablet    Refill:  0    Order Specific Question:   Supervising Provider    Answer:   Glennis Brink [[id number]]      Objective:   VITALS: Per patient if applicable, see vitals. GENERAL: Alert and in no acute distress. CARDIOPULMONARY: No increased WOB. Speaking in clear sentences.  PSYCH: Pleasant and cooperative. Speech  normal rate and rhythm. Affect is appropriate. Insight and judgement are appropriate. Attention is focused, linear, and appropriate.  NEURO: Oriented as arrived to appointment on time with no prompting.   Lab Results  Component Value Date   CREATININE 0.61 06/18/2022   BUN 8 06/18/2022   NA 139 06/18/2022   K 3.4 (L) 06/18/2022   CL 102 06/18/2022   CO2 27 06/18/2022   Lab Results  Component Value Date   ALT 10 06/18/2022   AST 9 06/18/2022   ALKPHOS 74 06/18/2022   BILITOT 0.4 06/18/2022   Lab Results  Component Value Date   HGBA1C 5.7 06/18/2022   HGBA1C 5.8 12/14/2021   HGBA1C 5.5 06/06/2021   HGBA1C 5.6 11/28/2020   HGBA1C 6.1 03/21/2020   Lab Results  Component Value Date   INSULIN 20.7 12/01/2019   INSULIN 12.9 07/07/2019   INSULIN 21.2 11/20/2018   INSULIN 15.4 07/14/2018   INSULIN 12.9 09/12/2017   Lab Results  Component Value Date   TSH 1.18 06/18/2022   Lab Results  Component Value Date   CHOL 100 06/18/2022   HDL 32.40 (L) 06/18/2022   LDLCALC 53 06/18/2022   TRIG 73.0 06/18/2022   CHOLHDL 3 06/18/2022   Lab Results  Component Value Date   WBC 5.2 06/18/2022   HGB 12.8 06/18/2022   HCT 36.2 06/18/2022   MCV 86.9 06/18/2022   PLT 263.0 06/18/2022   No results found for: "IRON", "TIBC", "FERRITIN" Lab Results  Component Value Date   VD25OH 68.76 06/18/2022   VD25OH 90.29 12/14/2021   VD25OH 78.96 06/06/2021    Attestation Statements:   Reviewed by clinician on day of visit: allergies, medications, problem list, medical history, surgical history, family history, social history, and previous encounter notes.

## 2022-07-11 ENCOUNTER — Encounter (INDEPENDENT_AMBULATORY_CARE_PROVIDER_SITE_OTHER): Payer: Self-pay | Admitting: Family Medicine

## 2022-07-11 ENCOUNTER — Telehealth (INDEPENDENT_AMBULATORY_CARE_PROVIDER_SITE_OTHER): Payer: BC Managed Care – PPO | Admitting: Family Medicine

## 2022-07-11 ENCOUNTER — Other Ambulatory Visit (HOSPITAL_BASED_OUTPATIENT_CLINIC_OR_DEPARTMENT_OTHER): Payer: Self-pay

## 2022-07-11 DIAGNOSIS — E1169 Type 2 diabetes mellitus with other specified complication: Secondary | ICD-10-CM

## 2022-07-11 DIAGNOSIS — E785 Hyperlipidemia, unspecified: Secondary | ICD-10-CM

## 2022-07-11 DIAGNOSIS — E669 Obesity, unspecified: Secondary | ICD-10-CM | POA: Diagnosis not present

## 2022-07-11 DIAGNOSIS — Z7984 Long term (current) use of oral hypoglycemic drugs: Secondary | ICD-10-CM

## 2022-07-11 DIAGNOSIS — Z6839 Body mass index (BMI) 39.0-39.9, adult: Secondary | ICD-10-CM

## 2022-07-11 DIAGNOSIS — R110 Nausea: Secondary | ICD-10-CM

## 2022-07-11 MED ORDER — ONDANSETRON 4 MG PO TBDP
4.0000 mg | ORAL_TABLET | Freq: Three times a day (TID) | ORAL | 0 refills | Status: DC | PRN
  Filled 2022-07-11: qty 18, 6d supply, fill #0

## 2022-07-11 MED ORDER — ATORVASTATIN CALCIUM 10 MG PO TABS
ORAL_TABLET | Freq: Every day | ORAL | 0 refills | Status: DC
  Filled 2022-07-11: qty 90, 90d supply, fill #0

## 2022-07-12 ENCOUNTER — Other Ambulatory Visit (HOSPITAL_BASED_OUTPATIENT_CLINIC_OR_DEPARTMENT_OTHER): Payer: Self-pay

## 2022-07-20 ENCOUNTER — Other Ambulatory Visit (INDEPENDENT_AMBULATORY_CARE_PROVIDER_SITE_OTHER): Payer: BC Managed Care – PPO

## 2022-07-20 DIAGNOSIS — E785 Hyperlipidemia, unspecified: Secondary | ICD-10-CM

## 2022-07-20 DIAGNOSIS — E1169 Type 2 diabetes mellitus with other specified complication: Secondary | ICD-10-CM

## 2022-07-20 NOTE — Addendum Note (Signed)
Addended by: Manuela Schwartz on: 07/20/2022 02:57 PM   Modules accepted: Orders

## 2022-07-21 ENCOUNTER — Encounter: Payer: Self-pay | Admitting: Family Medicine

## 2022-07-21 LAB — COMPREHENSIVE METABOLIC PANEL
AG Ratio: 1.5 (calc) (ref 1.0–2.5)
ALT: 12 U/L (ref 6–29)
AST: 10 U/L (ref 10–35)
Albumin: 4.1 g/dL (ref 3.6–5.1)
Alkaline phosphatase (APISO): 75 U/L (ref 37–153)
BUN: 9 mg/dL (ref 7–25)
CO2: 24 mmol/L (ref 20–32)
Calcium: 9.1 mg/dL (ref 8.6–10.4)
Chloride: 103 mmol/L (ref 98–110)
Creat: 0.65 mg/dL (ref 0.50–1.03)
Globulin: 2.8 g/dL (calc) (ref 1.9–3.7)
Glucose, Bld: 76 mg/dL (ref 65–99)
Potassium: 3.3 mmol/L — ABNORMAL LOW (ref 3.5–5.3)
Sodium: 138 mmol/L (ref 135–146)
Total Bilirubin: 0.4 mg/dL (ref 0.2–1.2)
Total Protein: 6.9 g/dL (ref 6.1–8.1)

## 2022-07-23 ENCOUNTER — Other Ambulatory Visit: Payer: Self-pay

## 2022-07-23 DIAGNOSIS — E1169 Type 2 diabetes mellitus with other specified complication: Secondary | ICD-10-CM

## 2022-07-25 ENCOUNTER — Encounter: Payer: Self-pay | Admitting: Cardiology

## 2022-07-25 ENCOUNTER — Ambulatory Visit: Payer: BC Managed Care – PPO | Attending: Cardiology | Admitting: Cardiology

## 2022-07-25 ENCOUNTER — Other Ambulatory Visit (HOSPITAL_BASED_OUTPATIENT_CLINIC_OR_DEPARTMENT_OTHER): Payer: Self-pay

## 2022-07-25 DIAGNOSIS — E785 Hyperlipidemia, unspecified: Secondary | ICD-10-CM

## 2022-07-25 DIAGNOSIS — E1169 Type 2 diabetes mellitus with other specified complication: Secondary | ICD-10-CM

## 2022-07-25 DIAGNOSIS — E1159 Type 2 diabetes mellitus with other circulatory complications: Secondary | ICD-10-CM

## 2022-07-25 DIAGNOSIS — I152 Hypertension secondary to endocrine disorders: Secondary | ICD-10-CM

## 2022-07-25 MED ORDER — ATORVASTATIN CALCIUM 10 MG PO TABS
10.0000 mg | ORAL_TABLET | Freq: Every day | ORAL | 3 refills | Status: DC
  Filled 2022-07-25 – 2022-10-23 (×4): qty 90, 90d supply, fill #0
  Filled 2023-01-21: qty 90, 90d supply, fill #1
  Filled 2023-04-28: qty 90, 90d supply, fill #2
  Filled 2023-07-23: qty 90, 90d supply, fill #3

## 2022-07-25 MED ORDER — FUROSEMIDE 20 MG PO TABS
20.0000 mg | ORAL_TABLET | Freq: Every day | ORAL | 3 refills | Status: DC | PRN
  Filled 2022-07-25: qty 30, 30d supply, fill #0

## 2022-07-25 MED ORDER — HYDROCHLOROTHIAZIDE 25 MG PO TABS
25.0000 mg | ORAL_TABLET | Freq: Every day | ORAL | 3 refills | Status: DC
  Filled 2022-07-25: qty 90, 90d supply, fill #0
  Filled 2022-11-15: qty 90, 90d supply, fill #1

## 2022-07-25 MED ORDER — CARVEDILOL 25 MG PO TABS
25.0000 mg | ORAL_TABLET | Freq: Two times a day (BID) | ORAL | 3 refills | Status: DC
  Filled 2022-07-25: qty 180, 90d supply, fill #0
  Filled 2022-11-05: qty 180, 90d supply, fill #1
  Filled 2023-02-01: qty 180, 90d supply, fill #2
  Filled 2023-04-28: qty 180, 90d supply, fill #3

## 2022-07-25 MED ORDER — LOSARTAN POTASSIUM 100 MG PO TABS
100.0000 mg | ORAL_TABLET | Freq: Every day | ORAL | 3 refills | Status: DC
  Filled 2022-07-25 – 2022-08-22 (×2): qty 90, 90d supply, fill #0
  Filled 2022-11-21: qty 90, 90d supply, fill #1
  Filled 2023-02-19: qty 90, 90d supply, fill #2
  Filled 2023-05-28: qty 90, 90d supply, fill #3

## 2022-07-25 NOTE — Patient Instructions (Signed)
Medication Instructions:  Your physician recommends that you continue on your current medications as directed. Please refer to the Current Medication list given to you today.  *If you need a refill on your cardiac medications before your next appointment, please call your pharmacy*   Lab Work: NONE If you have labs (blood work) drawn today and your tests are completely normal, you will receive your results only by: MyChart Message (if you have MyChart) OR A paper copy in the mail If you have any lab test that is abnormal or we need to change your treatment, we will call you to review the results.   Testing/Procedures: NONE   Follow-Up: At [facility], you and your health needs are our priority.  As part of our continuing mission to provide you with exceptional heart care, we have created designated Provider Care Teams.  These Care Teams include your primary Cardiologist (physician) and Advanced Practice Providers (APPs -  Physician Assistants and Nurse Practitioners) who all work together to provide you with the care you need, when you need it.  We recommend signing up for the patient portal called "MyChart".  Sign up information is provided on this After Visit Summary.  MyChart is used to connect with patients for Virtual Visits (Telemedicine).  Patients are able to view lab/test results, encounter notes, upcoming appointments, etc.  Non-urgent messages can be sent to your provider as well.   To learn more about what you can do with MyChart, go to [email or web address].    Your next appointment:   1 year(s)  The format for your next appointment:   In Person  Provider:   Kardie Tobb, DO   

## 2022-07-25 NOTE — Progress Notes (Signed)
Cardiology Office Note:    Date:  07/25/2022   ID:  Brandi Lester, DOB 13-Dec-1965, MRN [id number]  PCP:  Mosie Lukes, MD  Cardiologist:  Berniece Salines, DO  Electrophysiologist:  None   Referring MD: Mosie Lukes, MD   " I am doing well"   History of Present Illness:    Brandi Lester is a 56 y.o. female with a hx of hypertension, hyperlipidemia, diabetes mellitus is here today for follow-up visit.  I initially on October 19, 2020 to be evaluated for chest pain and palpitations.  At that time I placed a monitor on the patient as well as order a coronary CT and echocardiogram.  I saw the patient on Jan 16, 2021 at that time we switched her blood pressure medications from metoprolol to carvedilol.  We discussed her testing results.  This is outpatient she has been following her healthy wellness program.  She notes that her blood pressure has improved significantly.  She does still feel symptoms from her PVCs.  But they are not many and not debilitating.  I saw the patient on July 17, 2021 at that time she was hypertensive but had not taken her medication.  Did not change any antihypertensive medication.  She is here today for follow-up visit.  She is happy she has started following up in healthy weight and wellness.  She was recently started on Ozempic.  She is recovering from a respiratory viral infection.    Past Medical History:  Diagnosis Date   ADD (attention deficit disorder)    Allergy    allergic rhinitis   Anemia 07/25/2013   Angio-edema    Anxiety    Arthritis    not dx'd   Back pain    Costochondritis 02/20/2015   Diabetes mellitus    Type II   previously 3 years ago - no longer on meds    Dyslipidemia 07/25/2013   Fungus infection 06/2012   Left great toe   GERD (gastroesophageal reflux disease)    Hypertension    IBS (irritable bowel syndrome)    Ingrown nail 06/2012   right foot next to the last toe   Insomnia 04/20/2013   Joint pain     Lactose intolerance    Leg edema    Obesity    Pedal edema 02/20/2015   Prediabetes    Preventative health care 09/25/2015   PVC (premature ventricular contraction)    Recurrent upper respiratory infection (URI)    Urticaria     Past Surgical History:  Procedure Laterality Date   HYSTEROSCOPY N/A 10/04/2015   Procedure: HYSTEROSCOPY with Removal IUD;  Surgeon: Vanessa Kick, MD;  Location: [facility];  Service: Gynecology;  Laterality: N/A;   lasik     eye surgery   WISDOM TOOTH EXTRACTION      Current Medications: Current Meds  Medication Sig   acetaminophen (TYLENOL 8 HOUR) 650 MG CR tablet Take 1 tablet (650 mg total) every 8 (eight) hours as needed by mouth for pain.   albuterol (VENTOLIN HFA) 108 (90 Base) MCG/ACT inhaler Inhale 2 puffs into the lungs every 6 (six) hours as needed for wheezing or shortness of breath.   azelastine (ASTELIN) 0.1 % nasal spray Place 2 sprays into both nostrils 2 (two) times daily.   benzonatate (TESSALON PERLES) 100 MG capsule Take 1-2 capsules (100-200 mg total) by mouth 3 (three) times daily as needed for cough.   buPROPion (WELLBUTRIN SR) 200 MG 12 hr tablet Take  1 tablet (200 mg total) by mouth in the morning.   cholecalciferol (VITAMIN D3) 25 MCG (1000 UT) tablet Take 4,000 Units by mouth daily.   EPINEPHrine (EPIPEN 2-PAK) 0.3 mg/0.3 mL IJ SOAJ injection Use as directed for severe allergic reaction   fluticasone (FLONASE) 50 MCG/ACT nasal spray Place 2 sprays into both nostrils daily as needed for allergies.   ibuprofen (ADVIL,MOTRIN) 600 MG tablet Take 1 tablet (600 mg total) by mouth every 6 (six) hours as needed.   medroxyPROGESTERone (PROVERA) 10 MG tablet TAKE 1 TABLET (10 MG TOTAL) BY MOUTH DAILY.   metFORMIN (GLUCOPHAGE) 500 MG tablet Take 1 tablet (500 mg total) by mouth 2 (two) times daily with a meal.   montelukast (SINGULAIR) 10 MG tablet Take 1 tablet (10 mg total) by mouth at bedtime as needed.   ondansetron (ZOFRAN-ODT) 4 MG  disintegrating tablet Take 1 tablet (4 mg total) by mouth every 8 (eight) hours as needed for nausea or vomiting.   pantoprazole (PROTONIX) 40 MG tablet TAKE 1 TABLET BY MOUTH ONCE DAILY   potassium chloride SA (KLOR-CON M) 20 MEQ tablet Take 1 tablet (20 mEq total) by mouth 3 (three) times daily.   Semaglutide, 2 MG/DOSE, (OZEMPIC, 2 MG/DOSE,) 8 MG/3ML SOPN Inject 2 mg into the skin once a week.   TURMERIC PO Take 1 capsule by mouth daily.   [DISCONTINUED] atorvastatin (LIPITOR) 10 MG tablet TAKE 1 TABLET BY MOUTH ONCE DAILY   [DISCONTINUED] carvedilol (COREG) 25 MG tablet Take 1 tablet (25 mg total) by mouth 2 (two) times daily.   [DISCONTINUED] furosemide (LASIX) 20 MG tablet Take 1 tablet (20 mg total) by mouth daily as needed for edema (weight gain of >3 lbs/24 hours).   [DISCONTINUED] hydrochlorothiazide (HYDRODIURIL) 25 MG tablet Take 1 tablet (25 mg total) by mouth daily.   [DISCONTINUED] losartan (COZAAR) 100 MG tablet Take 1 tablet (100 mg total) by mouth daily.     Allergies:   Penicillins and Victoza [liraglutide]   Social History   Socioeconomic History   Marital status: Single    Spouse name: Not on file   Number of children: Not on file   Years of education: Not on file   Highest education level: Not on file  Occupational History   Occupation: Pharmacist, hospital, [employer]  Tobacco Use   Smoking status: Former    Packs/day: 1.00    Years: 15.00    Total pack years: 15.00    Types: Cigarettes    Quit date: 09/03/2001    Years since quitting: 20.9    Passive exposure: Never   Smokeless tobacco: Never  Vaping Use   Vaping Use: Never used  Substance and Sexual Activity   Alcohol use: Yes    Comment: rare   Drug use: No   Sexual activity: Yes    Birth control/protection: None  Other Topics Concern   Not on file  Social History Narrative   Not on file   Social Determinants of Health   Financial Resource Strain: Not on file  Food Insecurity: Not on file   Transportation Needs: Not on file  Physical Activity: Not on file  Stress: Not on file  Social Connections: Not on file     Family History: The patient's family history includes Allergic rhinitis in her maternal grandmother, mother, sister, sister, and sister; Cancer in her father; Heart attack (age of onset: 40) in her sister; Heart attack (age of onset: 49) in her mother; Heart disease in her mother;  Hypertension in her mother and another family member; Obesity in her mother; Prostate cancer in her father. There is no history of Colon cancer, Colon polyps, Esophageal cancer, Stomach cancer, or Rectal cancer.  ROS:   Review of Systems  Constitution: Negative for decreased appetite, fever and weight gain.  HENT: Negative for congestion, ear discharge, hoarse voice and sore throat.   Eyes: Negative for discharge, redness, vision loss in right eye and visual halos.  Cardiovascular: Negative for chest pain, dyspnea on exertion, leg swelling, orthopnea and palpitations.  Respiratory: Negative for cough, hemoptysis, shortness of breath and snoring.   Endocrine: Negative for heat intolerance and polyphagia.  Hematologic/Lymphatic: Negative for bleeding problem. Does not bruise/bleed easily.  Skin: Negative for flushing, nail changes, rash and suspicious lesions.  Musculoskeletal: Negative for arthritis, joint pain, muscle cramps, myalgias, neck pain and stiffness.  Gastrointestinal: Negative for abdominal pain, bowel incontinence, diarrhea and excessive appetite.  Genitourinary: Negative for decreased libido, genital sores and incomplete emptying.  Neurological: Negative for brief paralysis, focal weakness, headaches and loss of balance.  Psychiatric/Behavioral: Negative for altered mental status, depression and suicidal ideas.  Allergic/Immunologic: Negative for HIV exposure and persistent infections.    EKGs/Labs/Other Studies Reviewed:    The following studies were reviewed  today:   EKG: Sinus rhythm, heart rate 83 bpm compared to prior EKG no significant change.  CCTA 11/07/2020 Aorta: Normal size.  No calcifications.  No dissection.   Aortic Valve:  Trileaflet.  No calcifications.   Coronary Arteries:  Normal coronary origin.  Right dominance.   RCA is a large dominant artery that gives rise to PDA and PLVB. There is no plaque.   Left main is a large artery that gives rise to LAD and LCX arteries.   LAD is a large vessel that has no plaque.   LCX is a non-dominant artery that gives rise to one large OM1 branch. There is no plaque.   Other findings:   Normal pulmonary vein drainage into the left atrium.   Normal left atrial appendage without a thrombus.   Normal size of the pulmonary artery.   IMPRESSION: 1. Coronary calcium score of 0. This was 0 percentile for age and sex matched control.   2. Normal coronary origin with right dominance.   3. No evidence of CAD.   Berniece Salines, DO     Electronically Signed   By: Berniece Salines DO   On: 11/07/2020 17:04    Addended by Berniece Salines, DO on 11/07/2020  5:06 PM   Study Result  Narrative & Impression  EXAM: OVER-READ INTERPRETATION  CT CHEST   The following report is an over-read performed by radiologist Dr. Vinnie Langton of [facility], [place] on 11/07/2020. This over-read does not include interpretation of cardiac or coronary anatomy or pathology. The coronary calcium score/coronary CTA interpretation by the cardiologist is attached.   COMPARISON:  None.   FINDINGS: Within the visualized portions of the thorax there are no suspicious appearing pulmonary nodules or masses, there is no acute consolidative airspace disease, no pleural effusions, no pneumothorax and no lymphadenopathy. Visualized portions of the upper abdomen are unremarkable. There are no aggressive appearing lytic or blastic lesions noted in the visualized portions of the skeleton.   IMPRESSION: No  significant incidental noncardiac findings are noted.   Electronically Signed: By: Vinnie Langton M.D. On: 11/07/2020 10:53    Transthoracic echocardiogram November 14, 2020 IMPRESSIONS   1. Left ventricular ejection fraction, by estimation, is 60 to  65%. The  left ventricle has normal function. The left ventricle has no regional  wall motion abnormalities. Left ventricular diastolic parameters are  consistent with Grade I diastolic  dysfunction (impaired relaxation).   2. Right ventricular systolic function is normal. The right ventricular  size is normal.   3. The mitral valve is normal in structure. No evidence of mitral valve  regurgitation. No evidence of mitral stenosis.   4. The aortic valve is normal in structure. Aortic valve regurgitation is  not visualized. No aortic stenosis is present.   5. The inferior vena cava is normal in size with greater than 50%  respiratory variability, suggesting right atrial pressure of 3 mmHg.    ZIO monitor  The patient wore the monitor for 6 days 20 hours starting October 19, 2020. Indication: Palpitations   The minimum heart rate was 71 bpm, maximum heart rate was 120 bpm, and average heart rate was 71 bpm. Predominant underlying rhythm was Sinus Rhythm.     Premature atrial complexes were rare less than 1%. Premature Ventricular complexes were rare less than 1%.   No ventricular tachycardia, no pauses, No AV block, no supraventricular tachycardia and no atrial fibrillation present.   9 patient triggered events associated with premature atrial complexes and premature ventricular complex.  10 diary events associated with sinus rhythm and premature ventricular complex.     Conclusion: This study is remarkable for rare symptomatic premature atrial complex and premature ventricular complex.    Recent Labs: 12/14/2021: Magnesium 1.8 06/18/2022: Hemoglobin 12.8; Platelets 263.0; TSH 1.18 07/20/2022: ALT 12; BUN 9; Creat 0.65; Potassium  3.3; Sodium 138  Recent Lipid Panel    Component Value Date/Time   CHOL 100 06/18/2022 0953   CHOL 129 07/07/2019 0900   TRIG 73.0 06/18/2022 0953   HDL 32.40 (L) 06/18/2022 0953   HDL 33 (L) 07/07/2019 0900   CHOLHDL 3 06/18/2022 0953   VLDL 14.6 06/18/2022 0953   LDLCALC 53 06/18/2022 0953   LDLCALC 64 11/28/2020 1358    Physical Exam:    VS:  BP 124/80   Pulse 83   Ht '5\' 10"'$  (1.778 m)   Wt 282 lb 9.6 oz (128.2 kg)   SpO2 97%   BMI 40.55 kg/m     Wt Readings from Last 3 Encounters:  07/25/22 282 lb 9.6 oz (128.2 kg)  06/18/22 285 lb (129.3 kg)  06/11/22 278 lb (126.1 kg)     GEN: Well nourished, well developed in no acute distress HEENT: Normal NECK: No JVD; No carotid bruits LYMPHATICS: No lymphadenopathy CARDIAC: S1S2 noted,RRR, no murmurs, rubs, gallops RESPIRATORY:  Clear to auscultation without rales, wheezing or rhonchi  ABDOMEN: Soft, non-tender, non-distended, +bowel sounds, no guarding. EXTREMITIES: No edema, No cyanosis, no clubbing MUSCULOSKELETAL:  No deformity  SKIN: Warm and dry NEUROLOGIC:  Alert and oriented x 3, non-focal PSYCHIATRIC:  Normal affect, good insight  ASSESSMENT:    1. Hypertension associated with type 2 diabetes mellitus ([place])   2. Hyperlipidemia associated with type 2 diabetes mellitus ([place])     PLAN:    She is doing well from a cardiovascular standpoint.  No medication changes today.  He is excited about her journey with Ozempic.  I am happy for her.  The patient understands the need to lose weight with diet and exercise. We have discussed specific strategies for this.  The patient is in agreement with the above plan. The patient left the office in stable condition.  The patient will follow  up in 1 year or sooner if needed.   Medication Adjustments/Labs and Tests Ordered: Current medicines are reviewed at length with the patient today.  Concerns regarding medicines are outlined above.  Orders Placed This Encounter   Procedures   EKG 12-Lead    Meds ordered this encounter  Medications   carvedilol (COREG) 25 MG tablet    Sig: Take 1 tablet (25 mg total) by mouth 2 (two) times daily.    Dispense:  180 tablet    Refill:  3   atorvastatin (LIPITOR) 10 MG tablet    Sig: Take 1 tablet (10 mg total) by mouth daily.    Dispense:  90 tablet    Refill:  3   furosemide (LASIX) 20 MG tablet    Sig: Take 1 tablet (20 mg total) by mouth daily as needed for edema (weight gain of >3 lbs/24 hours).    Dispense:  30 tablet    Refill:  3   hydrochlorothiazide (HYDRODIURIL) 25 MG tablet    Sig: Take 1 tablet (25 mg total) by mouth daily.    Dispense:  90 tablet    Refill:  3   losartan (COZAAR) 100 MG tablet    Sig: Take 1 tablet (100 mg total) by mouth daily.    Dispense:  90 tablet    Refill:  3     Patient Instructions  Medication Instructions:  Your physician recommends that you continue on your current medications as directed. Please refer to the Current Medication list given to you today.  *If you need a refill on your cardiac medications before your next appointment, please call your pharmacy*   Lab Work: NONE If you have labs (blood work) drawn today and your tests are completely normal, you will receive your results only by: [place] (if you have MyChart) OR A paper copy in the mail If you have any lab test that is abnormal or we need to change your treatment, we will call you to review the results.   Testing/Procedures: NONE   Follow-Up: At [facility], you and your health needs are our priority.  As part of our continuing mission to provide you with exceptional heart care, we have created designated Provider Care Teams.  These Care Teams include your primary Cardiologist (physician) and Advanced Practice Providers (APPs -  Physician Assistants and Nurse Practitioners) who all work together to provide you with the care you need, when you need it.  We recommend signing  up for the patient portal called "MyChart".  Sign up information is provided on this After Visit Summary.  MyChart is used to connect with patients for Virtual Visits (Telemedicine).  Patients are able to view lab/test results, encounter notes, upcoming appointments, etc.  Non-urgent messages can be sent to your provider as well.   To learn more about what you can do with MyChart, go to [email or web address].    Your next appointment:   1 year(s)  The format for your next appointment:   In Person  Provider:   Berniece Salines, DO     Adopting a Healthy Lifestyle.  Know what a healthy weight is for you (roughly BMI <25) and aim to maintain this   Aim for 7+ servings of fruits and vegetables daily   65-80+ fluid ounces of water or unsweet tea for healthy kidneys   Limit to max 1 drink of alcohol per day; avoid smoking/tobacco   Limit animal fats in diet for cholesterol and heart health - choose  grass fed whenever available   Avoid highly processed foods, and foods high in saturated/trans fats   Aim for low stress - take time to unwind and care for your mental health   Aim for 150 min of moderate intensity exercise weekly for heart health, and weights twice weekly for bone health   Aim for 7-9 hours of sleep daily   When it comes to diets, agreement about the perfect plan isnt easy to find, even among the experts. Experts at the [employer] developed an idea known as the Healthy Eating Plate. Just imagine a plate divided into logical, healthy portions.   The emphasis is on diet quality:   Load up on vegetables and fruits - one-half of your plate: Aim for color and variety, and remember that potatoes dont count.   Go for whole grains - one-quarter of your plate: Whole wheat, barley, wheat berries, quinoa, oats, brown rice, and foods made with them. If you want pasta, go with whole wheat pasta.   Protein power - one-quarter of your plate: Fish, chicken, beans,  and nuts are all healthy, versatile protein sources. Limit red meat.   The diet, however, does go beyond the plate, offering a few other suggestions.   Use healthy plant oils, such as olive, canola, soy, corn, sunflower and peanut. Check the labels, and avoid partially hydrogenated oil, which have unhealthy trans fats.   If youre thirsty, drink water. Coffee and tea are good in moderation, but skip sugary drinks and limit milk and dairy products to one or two daily servings.   The type of carbohydrate in the diet is more important than the amount. Some sources of carbohydrates, such as vegetables, fruits, whole grains, and beans-are healthier than others.   Finally, stay active  Signed, Berniece Salines, DO  07/25/2022 8:24 AM    [place]

## 2022-08-03 ENCOUNTER — Other Ambulatory Visit (INDEPENDENT_AMBULATORY_CARE_PROVIDER_SITE_OTHER): Payer: BC Managed Care – PPO

## 2022-08-03 DIAGNOSIS — E785 Hyperlipidemia, unspecified: Secondary | ICD-10-CM | POA: Diagnosis not present

## 2022-08-03 DIAGNOSIS — E1169 Type 2 diabetes mellitus with other specified complication: Secondary | ICD-10-CM

## 2022-08-03 NOTE — Addendum Note (Signed)
Addended by: Kelle Darting A on: 08/03/2022 02:51 PM   Modules accepted: Orders

## 2022-08-04 ENCOUNTER — Encounter: Payer: Self-pay | Admitting: Family Medicine

## 2022-08-04 ENCOUNTER — Other Ambulatory Visit (INDEPENDENT_AMBULATORY_CARE_PROVIDER_SITE_OTHER): Payer: Self-pay | Admitting: Adult Health

## 2022-08-04 DIAGNOSIS — E1169 Type 2 diabetes mellitus with other specified complication: Secondary | ICD-10-CM

## 2022-08-04 LAB — COMPREHENSIVE METABOLIC PANEL
AG Ratio: 1.5 (calc) (ref 1.0–2.5)
ALT: 13 U/L (ref 6–29)
AST: 11 U/L (ref 10–35)
Albumin: 4.3 g/dL (ref 3.6–5.1)
Alkaline phosphatase (APISO): 86 U/L (ref 37–153)
BUN: 12 mg/dL (ref 7–25)
CO2: 25 mmol/L (ref 20–32)
Calcium: 9.4 mg/dL (ref 8.6–10.4)
Chloride: 102 mmol/L (ref 98–110)
Creat: 0.79 mg/dL (ref 0.50–1.03)
Globulin: 2.9 g/dL (calc) (ref 1.9–3.7)
Glucose, Bld: 92 mg/dL (ref 65–99)
Potassium: 3.7 mmol/L (ref 3.5–5.3)
Sodium: 139 mmol/L (ref 135–146)
Total Bilirubin: 0.3 mg/dL (ref 0.2–1.2)
Total Protein: 7.2 g/dL (ref 6.1–8.1)

## 2022-08-06 ENCOUNTER — Other Ambulatory Visit: Payer: Self-pay | Admitting: Family Medicine

## 2022-08-06 ENCOUNTER — Other Ambulatory Visit (HOSPITAL_BASED_OUTPATIENT_CLINIC_OR_DEPARTMENT_OTHER): Payer: Self-pay

## 2022-08-06 ENCOUNTER — Other Ambulatory Visit: Payer: Self-pay

## 2022-08-06 DIAGNOSIS — E1159 Type 2 diabetes mellitus with other circulatory complications: Secondary | ICD-10-CM

## 2022-08-06 DIAGNOSIS — E785 Hyperlipidemia, unspecified: Secondary | ICD-10-CM

## 2022-08-06 DIAGNOSIS — I152 Hypertension secondary to endocrine disorders: Secondary | ICD-10-CM

## 2022-08-06 NOTE — Telephone Encounter (Signed)
Called pt was advised and lab appt made  Pt stated she understand

## 2022-08-07 ENCOUNTER — Other Ambulatory Visit (HOSPITAL_BASED_OUTPATIENT_CLINIC_OR_DEPARTMENT_OTHER): Payer: Self-pay

## 2022-08-07 ENCOUNTER — Encounter: Payer: Self-pay | Admitting: Cardiology

## 2022-08-07 MED ORDER — AMLODIPINE BESYLATE 10 MG PO TABS
10.0000 mg | ORAL_TABLET | Freq: Every day | ORAL | 3 refills | Status: DC
  Filled 2022-08-07: qty 90, 90d supply, fill #0
  Filled 2022-11-15: qty 90, 90d supply, fill #1

## 2022-08-08 ENCOUNTER — Ambulatory Visit (INDEPENDENT_AMBULATORY_CARE_PROVIDER_SITE_OTHER): Payer: BC Managed Care – PPO | Admitting: Physician Assistant

## 2022-08-08 ENCOUNTER — Other Ambulatory Visit (HOSPITAL_BASED_OUTPATIENT_CLINIC_OR_DEPARTMENT_OTHER): Payer: Self-pay

## 2022-08-08 ENCOUNTER — Encounter (INDEPENDENT_AMBULATORY_CARE_PROVIDER_SITE_OTHER): Payer: Self-pay | Admitting: Physician Assistant

## 2022-08-08 VITALS — BP 130/79 | HR 86 | Temp 98.2°F | Ht 70.0 in | Wt 277.0 lb

## 2022-08-08 DIAGNOSIS — Z7984 Long term (current) use of oral hypoglycemic drugs: Secondary | ICD-10-CM

## 2022-08-08 DIAGNOSIS — Z6839 Body mass index (BMI) 39.0-39.9, adult: Secondary | ICD-10-CM

## 2022-08-08 DIAGNOSIS — I152 Hypertension secondary to endocrine disorders: Secondary | ICD-10-CM

## 2022-08-08 DIAGNOSIS — E1169 Type 2 diabetes mellitus with other specified complication: Secondary | ICD-10-CM

## 2022-08-08 DIAGNOSIS — R110 Nausea: Secondary | ICD-10-CM | POA: Diagnosis not present

## 2022-08-08 DIAGNOSIS — E1159 Type 2 diabetes mellitus with other circulatory complications: Secondary | ICD-10-CM

## 2022-08-08 DIAGNOSIS — E669 Obesity, unspecified: Secondary | ICD-10-CM

## 2022-08-08 DIAGNOSIS — K5900 Constipation, unspecified: Secondary | ICD-10-CM | POA: Diagnosis not present

## 2022-08-08 MED ORDER — METFORMIN HCL 500 MG PO TABS
500.0000 mg | ORAL_TABLET | Freq: Two times a day (BID) | ORAL | 0 refills | Status: DC
  Filled 2022-08-08: qty 180, 90d supply, fill #0

## 2022-08-08 MED ORDER — ONDANSETRON 4 MG PO TBDP
4.0000 mg | ORAL_TABLET | Freq: Three times a day (TID) | ORAL | 0 refills | Status: DC | PRN
  Filled 2022-08-08: qty 18, 6d supply, fill #0

## 2022-08-09 ENCOUNTER — Other Ambulatory Visit (HOSPITAL_BASED_OUTPATIENT_CLINIC_OR_DEPARTMENT_OTHER): Payer: Self-pay

## 2022-08-10 ENCOUNTER — Ambulatory Visit (INDEPENDENT_AMBULATORY_CARE_PROVIDER_SITE_OTHER): Payer: BC Managed Care – PPO

## 2022-08-10 ENCOUNTER — Other Ambulatory Visit (INDEPENDENT_AMBULATORY_CARE_PROVIDER_SITE_OTHER): Payer: BC Managed Care – PPO

## 2022-08-10 ENCOUNTER — Other Ambulatory Visit: Payer: Self-pay

## 2022-08-10 VITALS — BP 128/78 | HR 84

## 2022-08-10 DIAGNOSIS — E1169 Type 2 diabetes mellitus with other specified complication: Secondary | ICD-10-CM | POA: Diagnosis not present

## 2022-08-10 DIAGNOSIS — I152 Hypertension secondary to endocrine disorders: Secondary | ICD-10-CM

## 2022-08-10 DIAGNOSIS — E1159 Type 2 diabetes mellitus with other circulatory complications: Secondary | ICD-10-CM | POA: Diagnosis not present

## 2022-08-10 DIAGNOSIS — E785 Hyperlipidemia, unspecified: Secondary | ICD-10-CM

## 2022-08-10 NOTE — Progress Notes (Signed)
Pt here for Blood pressure check per Dr.Blyth   Pt currently takes: Amlodipine 10 mg Carvedilol 25 mg, and  HCTZ 12.5 mg Losartan 100 mg    Pt reports compliance with medication.  BP today @ = 128/78 HR =84  Pt advised  per: Dr.Wendling  Continue same dozes and follow up Pcp at next visit.

## 2022-08-11 LAB — COMPREHENSIVE METABOLIC PANEL
AG Ratio: 1.6 (calc) (ref 1.0–2.5)
ALT: 12 U/L (ref 6–29)
AST: 11 U/L (ref 10–35)
Albumin: 4.1 g/dL (ref 3.6–5.1)
Alkaline phosphatase (APISO): 75 U/L (ref 37–153)
BUN: 12 mg/dL (ref 7–25)
CO2: 22 mmol/L (ref 20–32)
Calcium: 9.2 mg/dL (ref 8.6–10.4)
Chloride: 104 mmol/L (ref 98–110)
Creat: 0.79 mg/dL (ref 0.50–1.03)
Globulin: 2.6 g/dL (calc) (ref 1.9–3.7)
Glucose, Bld: 76 mg/dL (ref 65–99)
Potassium: 3.8 mmol/L (ref 3.5–5.3)
Sodium: 139 mmol/L (ref 135–146)
Total Bilirubin: 0.3 mg/dL (ref 0.2–1.2)
Total Protein: 6.7 g/dL (ref 6.1–8.1)

## 2022-08-22 ENCOUNTER — Other Ambulatory Visit (HOSPITAL_BASED_OUTPATIENT_CLINIC_OR_DEPARTMENT_OTHER): Payer: Self-pay

## 2022-08-22 ENCOUNTER — Other Ambulatory Visit: Payer: Self-pay | Admitting: Family Medicine

## 2022-08-22 MED ORDER — PANTOPRAZOLE SODIUM 40 MG PO TBEC
40.0000 mg | DELAYED_RELEASE_TABLET | Freq: Every day | ORAL | 1 refills | Status: DC
  Filled 2022-08-22: qty 90, 90d supply, fill #0
  Filled 2022-11-15: qty 90, 90d supply, fill #1

## 2022-08-22 NOTE — Progress Notes (Unsigned)
Chief Complaint:   OBESITY Brandi Lester is here to discuss her progress with her obesity treatment plan along with follow-up of her obesity related diagnoses. Brandi Lester is on the Category 4 Plan and states she is following her eating plan approximately 70% of the time. Brandi Lester states she is walking treadmill/bike 45 minutes 3-4 times per week.  Today's visit was #: 67 Starting weight: 325 lbs Starting date: 05/23/2017 Today's weight: 277 lbs Today's date: 08/08/2022 Total lbs lost to date: 48 lbs Total lbs lost since last in-office visit: 1   Interim History: Patient has done well with weight loss- some intermittent nausea and vomiting on Ozempic 2 mg weekly. Reports did not have this issue on [name], but no insurance coverage for [facility] now.  Subjective:   1. Type 2 diabetes mellitus with other specified complication, without long-term current use of insulin (HCC) Taking Ozempic 2 mg weekly.  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  Last A1c of 5.7 on 06/18/2022.  Taking metformin 500 mg twice a day with no side effects.  Some increased simple carbs (sweet tea) and may be part of nausea and discussed decreasing simple carbohydrates may help decrease the nausea.   2. Nausea Related to GLP-1, Ozempic. Notes Zofran helps--counseled to avoid simple carbs as well.  3. Constipation, unspecified constipation type She reports some continued constipation/decreased stool frequency on Ozempic, and we discussed taking Metamucil and Miralax regularly to keep having consistent BM and avoid hard stools.  4. Hypertension associated with type 2 diabetes mellitus ([place]) PCP has decreased HCTZ to 12.5 mg as patient having increase problem maintaining potassium and requiring KCL regularly--She has follow up BMET this week per PCP.   Blood pressure at goal today.  Assessment/Plan:   1. Type 2 diabetes mellitus with other specified complication,  without long-term current use of insulin (HCC) Continue/Refill Metformin 500 mg twice a day for 1 month with 0 refills.  -Refill metFORMIN (GLUCOPHAGE) 500 MG tablet; Take 1 tablet (500 mg total) by mouth 2 (two) times daily with a meal.  Dispense: 180 tablet; Refill: 0  2. Nausea We will refill Zofran 4 mg as needed for 1 month with 0 refills.  -Refill ondansetron (ZOFRAN-ODT) 4 MG disintegrating tablet; Take 1 tablet (4 mg total) by mouth every 8 (eight) hours as needed for nausea or vomiting.  Dispense: 20 tablet; Refill: 0  3. Constipation, unspecified constipation type Continue Metamucil and Mirlax and also increase fiber in diet and discussed high fiber foods.   4. Hypertension associated with type 2 diabetes mellitus ([place]) Continue decreased dose of HCTZ 12.5 mg daily and follow up with PCP for monitoring of potassium. Monitor for hypotension. Continue healthy eating plan and exercise to promote weight loss.  5. Obesity: BMI 39.8 Brandi Lester is currently in the action stage of change. As such, her goal is to continue with weight loss efforts. She has agreed to the Category 4 Plan.   Exercise goals: As is.  Behavioral modification strategies: increasing lean protein intake, decreasing simple carbohydrates, and holiday eating strategies .  Brandi Lester has agreed to follow-up with our clinic in 4 weeks. She was informed of the importance of frequent follow-up visits to maximize her success with intensive lifestyle modifications for her multiple health conditions.   Objective:   Blood pressure 130/79, pulse 86, temperature 98.2 F (36.8 C), height '5\' 10"'$  (1.778 m), weight 277 lb (125.6 kg), SpO2 99 %. Body mass index is  39.75 kg/m.  General: Cooperative, alert, well developed, in no acute distress. HEENT: Conjunctivae and lids unremarkable. Cardiovascular: Regular rhythm.  Lungs: Normal work of breathing. Neurologic: No focal deficits.   Lab Results  Component Value Date    CREATININE 0.79 08/10/2022   BUN 12 08/10/2022   NA 139 08/10/2022   K 3.8 08/10/2022   CL 104 08/10/2022   CO2 22 08/10/2022   Lab Results  Component Value Date   ALT 12 08/10/2022   AST 11 08/10/2022   ALKPHOS 74 06/18/2022   BILITOT 0.3 08/10/2022   Lab Results  Component Value Date   HGBA1C 5.7 06/18/2022   HGBA1C 5.8 12/14/2021   HGBA1C 5.5 06/06/2021   HGBA1C 5.6 11/28/2020   HGBA1C 6.1 03/21/2020   Lab Results  Component Value Date   INSULIN 20.7 12/01/2019   INSULIN 12.9 07/07/2019   INSULIN 21.2 11/20/2018   INSULIN 15.4 07/14/2018   INSULIN 12.9 09/12/2017   Lab Results  Component Value Date   TSH 1.18 06/18/2022   Lab Results  Component Value Date   CHOL 100 06/18/2022   HDL 32.40 (L) 06/18/2022   LDLCALC 53 06/18/2022   TRIG 73.0 06/18/2022   CHOLHDL 3 06/18/2022   Lab Results  Component Value Date   VD25OH 68.76 06/18/2022   VD25OH 90.29 12/14/2021   VD25OH 78.96 06/06/2021   Lab Results  Component Value Date   WBC 5.2 06/18/2022   HGB 12.8 06/18/2022   HCT 36.2 06/18/2022   MCV 86.9 06/18/2022   PLT 263.0 06/18/2022   No results found for: "IRON", "TIBC", "FERRITIN"  Attestation Statements:   Reviewed by clinician on day of visit: allergies, medications, problem list, medical history, surgical history, family history, social history, and previous encounter notes.  I, Brendell Tyus, am acting as transcriptionist for [employer], [employer].  I have reviewed the above documentation for accuracy and completeness, and I agree with the above. -  Aymee Fomby,PA-C

## 2022-08-30 ENCOUNTER — Encounter (INDEPENDENT_AMBULATORY_CARE_PROVIDER_SITE_OTHER): Payer: Self-pay | Admitting: Physician Assistant

## 2022-08-30 ENCOUNTER — Ambulatory Visit (INDEPENDENT_AMBULATORY_CARE_PROVIDER_SITE_OTHER): Payer: BC Managed Care – PPO | Admitting: Physician Assistant

## 2022-08-30 ENCOUNTER — Other Ambulatory Visit (HOSPITAL_BASED_OUTPATIENT_CLINIC_OR_DEPARTMENT_OTHER): Payer: Self-pay

## 2022-08-30 VITALS — BP 126/81 | HR 79 | Temp 99.2°F | Ht 70.0 in | Wt 270.0 lb

## 2022-08-30 DIAGNOSIS — E1169 Type 2 diabetes mellitus with other specified complication: Secondary | ICD-10-CM

## 2022-08-30 DIAGNOSIS — E669 Obesity, unspecified: Secondary | ICD-10-CM | POA: Diagnosis not present

## 2022-08-30 DIAGNOSIS — F3289 Other specified depressive episodes: Secondary | ICD-10-CM

## 2022-08-30 DIAGNOSIS — Z7985 Long-term (current) use of injectable non-insulin antidiabetic drugs: Secondary | ICD-10-CM

## 2022-08-30 DIAGNOSIS — Z6838 Body mass index (BMI) 38.0-38.9, adult: Secondary | ICD-10-CM | POA: Diagnosis not present

## 2022-08-30 MED ORDER — OZEMPIC (2 MG/DOSE) 8 MG/3ML ~~LOC~~ SOPN
2.0000 mg | PEN_INJECTOR | SUBCUTANEOUS | 2 refills | Status: DC
  Filled 2022-08-30: qty 3, 28d supply, fill #0

## 2022-08-30 MED ORDER — BUPROPION HCL ER (SR) 200 MG PO TB12
200.0000 mg | ORAL_TABLET | Freq: Every morning | ORAL | 0 refills | Status: DC
  Filled 2022-08-30: qty 90, 90d supply, fill #0

## 2022-08-31 ENCOUNTER — Other Ambulatory Visit: Payer: Self-pay | Admitting: Family Medicine

## 2022-08-31 DIAGNOSIS — Z1231 Encounter for screening mammogram for malignant neoplasm of breast: Secondary | ICD-10-CM

## 2022-09-12 NOTE — Progress Notes (Signed)
Chief Complaint:   OBESITY Brandi Lester is here to discuss her progress with her obesity treatment plan along with follow-up of her obesity related diagnoses. Brandi Lester is on the Category 4 Plan and states she is following her eating plan approximately 50% of the time. Brandi Lester states she is exercising 0 minutes 0 times per week.  Today's visit was #: 83 Starting weight: 325 lbs Starting date: 05/23/2017 Today's weight: 270 lbs Today's date: 08/30/2022 Total lbs lost to date: 5 lbs Total lbs lost since last in-office visit: 7  Interim History: Brandi Lester has done well with weight loss over the holiday.   She was mindful over the holidays and did well with portion control and smarter choices.  Reports hunger and appetite were controlled.     Wants to add back exercise in January 2024.  Subjective:   1. Type 2 diabetes mellitus with other specified complication, without long-term current use of insulin (HCC) Brandi Lester is taking Ozempic 2 mg weekly-no side effects and taking metformin 500 mg twice a day with no side effects.  Her last A1c was 5.7 on 06/18/2022 which continues to be much improved  2. Other depression, with emotional eating Brandi Lester is taking Wellbutrin SR 200 mg daily daily.  Notes some increased craving for sweets lately.  Will monitor on current medication.  Assessment/Plan:   1. Type 2 diabetes mellitus with other specified complication, without long-term current use of insulin (HCC) Continue/Refill Ozempic 2 mg SQ for 1 month with 0 refills. Continue healthy eating plan to decrease simple carbohydrates, increase lean protein and exercise to promote weight loss.  -Refill Semaglutide, 2 MG/DOSE, (OZEMPIC, 2 MG/DOSE,) 8 MG/3ML SOPN; Inject 2 mg into the skin once a week.  Dispense: 3 mL; Refill: 2  2. Other depression, with emotional eating Continue/Refill Wellbutrin 200 mg daily for 3 months with 0 refills.  -Refill buPROPion (WELLBUTRIN SR) 200 MG 12 hr tablet; Take 1  tablet (200 mg total) by mouth in the morning.  Dispense: 90 tablet; Refill: 0  3. Obesity: BMI 38.8 Brandi Lester is currently in the action stage of change. As such, her goal is to continue with weight loss efforts. She has agreed to the Category 4 Plan.   Exercise goals: As is.  Behavioral modification strategies: increasing lean protein intake, decreasing simple carbohydrates, increasing water intake, and meal planning and cooking strategies.  Brandi Lester has agreed to follow-up with our clinic in 4 weeks. She was informed of the importance of frequent follow-up visits to maximize her success with intensive lifestyle modifications for her multiple health conditions.   Objective:   Blood pressure 126/81, pulse 79, temperature 99.2 F (37.3 C), height '5\' 10"'$  (1.778 m), weight 270 lb (122.5 kg), SpO2 100 %. Body mass index is 38.74 kg/m.  General: Cooperative, alert, well developed, in no acute distress. HEENT: Conjunctivae and lids unremarkable. Cardiovascular: Regular rhythm.  Lungs: Normal work of breathing. Neurologic: No focal deficits.   Lab Results  Component Value Date   CREATININE 0.79 08/10/2022   BUN 12 08/10/2022   NA 139 08/10/2022   K 3.8 08/10/2022   CL 104 08/10/2022   CO2 22 08/10/2022   Lab Results  Component Value Date   ALT 12 08/10/2022   AST 11 08/10/2022   ALKPHOS 74 06/18/2022   BILITOT 0.3 08/10/2022   Lab Results  Component Value Date   HGBA1C 5.7 06/18/2022   HGBA1C 5.8 12/14/2021   HGBA1C 5.5 06/06/2021   HGBA1C 5.6 11/28/2020  HGBA1C 6.1 03/21/2020   Lab Results  Component Value Date   INSULIN 20.7 12/01/2019   INSULIN 12.9 07/07/2019   INSULIN 21.2 11/20/2018   INSULIN 15.4 07/14/2018   INSULIN 12.9 09/12/2017   Lab Results  Component Value Date   TSH 1.18 06/18/2022   Lab Results  Component Value Date   CHOL 100 06/18/2022   HDL 32.40 (L) 06/18/2022   LDLCALC 53 06/18/2022   TRIG 73.0 06/18/2022   CHOLHDL 3 06/18/2022   Lab  Results  Component Value Date   VD25OH 68.76 06/18/2022   VD25OH 90.29 12/14/2021   VD25OH 78.96 06/06/2021   Lab Results  Component Value Date   WBC 5.2 06/18/2022   HGB 12.8 06/18/2022   HCT 36.2 06/18/2022   MCV 86.9 06/18/2022   PLT 263.0 06/18/2022   No results found for: "IRON", "TIBC", "FERRITIN"  Attestation Statements:   Reviewed by clinician on day of visit: allergies, medications, problem list, medical history, surgical history, family history, social history, and previous encounter notes.  I, Brendell Tyus, am acting as transcriptionist for [employer], [employer].  I have reviewed the above documentation for accuracy and completeness, and I agree with the above. -  Delman Goshorn,PA-C

## 2022-10-01 ENCOUNTER — Other Ambulatory Visit: Payer: Self-pay

## 2022-10-01 ENCOUNTER — Ambulatory Visit (INDEPENDENT_AMBULATORY_CARE_PROVIDER_SITE_OTHER): Payer: BC Managed Care – PPO | Admitting: Physician Assistant

## 2022-10-01 ENCOUNTER — Encounter (INDEPENDENT_AMBULATORY_CARE_PROVIDER_SITE_OTHER): Payer: Self-pay | Admitting: Physician Assistant

## 2022-10-01 ENCOUNTER — Other Ambulatory Visit (HOSPITAL_BASED_OUTPATIENT_CLINIC_OR_DEPARTMENT_OTHER): Payer: Self-pay

## 2022-10-01 VITALS — BP 115/72 | HR 86 | Temp 99.3°F | Ht 70.0 in | Wt 275.0 lb

## 2022-10-01 DIAGNOSIS — Z7985 Long-term (current) use of injectable non-insulin antidiabetic drugs: Secondary | ICD-10-CM

## 2022-10-01 DIAGNOSIS — Z6839 Body mass index (BMI) 39.0-39.9, adult: Secondary | ICD-10-CM | POA: Diagnosis not present

## 2022-10-01 DIAGNOSIS — E1169 Type 2 diabetes mellitus with other specified complication: Secondary | ICD-10-CM | POA: Diagnosis not present

## 2022-10-01 DIAGNOSIS — E669 Obesity, unspecified: Secondary | ICD-10-CM

## 2022-10-01 DIAGNOSIS — F3289 Other specified depressive episodes: Secondary | ICD-10-CM | POA: Diagnosis not present

## 2022-10-01 DIAGNOSIS — Z7984 Long term (current) use of oral hypoglycemic drugs: Secondary | ICD-10-CM

## 2022-10-01 MED ORDER — TIRZEPATIDE 5 MG/0.5ML ~~LOC~~ SOAJ
5.0000 mg | SUBCUTANEOUS | 0 refills | Status: DC
  Filled 2022-10-01: qty 2, 28d supply, fill #0

## 2022-10-08 ENCOUNTER — Encounter: Payer: Self-pay | Admitting: Family Medicine

## 2022-10-08 ENCOUNTER — Ambulatory Visit: Payer: BC Managed Care – PPO | Admitting: Family Medicine

## 2022-10-08 ENCOUNTER — Other Ambulatory Visit: Payer: Self-pay

## 2022-10-08 VITALS — BP 134/70 | HR 87 | Temp 98.4°F | Resp 16 | Wt 278.8 lb

## 2022-10-08 DIAGNOSIS — K219 Gastro-esophageal reflux disease without esophagitis: Secondary | ICD-10-CM | POA: Diagnosis not present

## 2022-10-08 DIAGNOSIS — J3089 Other allergic rhinitis: Secondary | ICD-10-CM

## 2022-10-08 DIAGNOSIS — J329 Chronic sinusitis, unspecified: Secondary | ICD-10-CM | POA: Diagnosis not present

## 2022-10-08 DIAGNOSIS — J302 Other seasonal allergic rhinitis: Secondary | ICD-10-CM

## 2022-10-08 MED ORDER — XHANCE 93 MCG/ACT NA EXHU: INHALANT_SUSPENSION | NASAL | 5 refills | Status: DC

## 2022-10-08 MED ORDER — CARBINOXAMINE MALEATE 4 MG PO TABS: 4.0000 mg | ORAL_TABLET | Freq: Four times a day (QID) | ORAL | 0 refills | Status: AC | PRN

## 2022-10-08 NOTE — Patient Instructions (Addendum)
Allergic rhinitis Continue allergen avoidance measures directed toward grass pollen, ragweed pollen, dog, and dust mite as listed below Begin carbinoxamine 4 mg tablets.  Take 2 tablets in the morning and 2 tablets in the evening if needed for nasal symptoms. We may need to change your antihistamine once or twice a year for best results.  Some examples of over the counter antihistamines include Zyrtec (cetirizine), Xyzal (levocetirizine), Allegra (fexofenadine), and Claritin (loratidine) Begin Xhance nasal spray 2 sprays in each nostril twice a day for nasal congestion Consider saline nasal rinses as needed for nasal symptoms. Use this before any medicated nasal sprays for best result Consider allergen immunotherapy if the treatment plan as listed above is not controlling your symptoms.  Call the clinic if this treatment plan is not working well for you  Recurrent sinusitis Keep track of infections, antibiotic use, and steroid use  Reflux Continue dietary and lifestyle modifications as listed below Continue pantoprazole as previously prescribed  Follow up in 3 months or sooner if needed.  Reducing Pollen Exposure The American Academy of Allergy, Asthma and Immunology suggests the following steps to reduce your exposure to pollen during allergy seasons. Do not hang sheets or clothing out to dry; pollen may collect on these items. Do not mow lawns or spend time around freshly cut grass; mowing stirs up pollen. Keep windows closed at night.  Keep car windows closed while driving. Minimize morning activities outdoors, a time when pollen counts are usually at their highest. Stay indoors as much as possible when pollen counts or humidity is high and on windy days when pollen tends to remain in the air longer. Use air conditioning when possible.  Many air conditioners have filters that trap the pollen spores. Use a HEPA room air filter to remove pollen form the indoor air you breathe.  Control of  Dog or Cat Allergen Avoidance is the best way to manage a dog or cat allergy. If you have a dog or cat and are allergic to dog or cats, consider removing the dog or cat from the home. If you have a dog or cat but don't want to find it a new home, or if your family wants a pet even though someone in the household is allergic, here are some strategies that may help keep symptoms at bay:  Keep the pet out of your bedroom and restrict it to only a few rooms. Be advised that keeping the dog or cat in only one room will not limit the allergens to that room. Don't pet, hug or kiss the dog or cat; if you do, wash your hands with soap and water. High-efficiency particulate air (HEPA) cleaners run continuously in a bedroom or living room can reduce allergen levels over time. Regular use of a high-efficiency vacuum cleaner or a central vacuum can reduce allergen levels. Giving your dog or cat a bath at least once a week can reduce airborne allergen.   Control of Dust Mite Allergen Dust mites play a major role in allergic asthma and rhinitis. They occur in environments with high humidity wherever human skin is found. Dust mites absorb humidity from the atmosphere (ie, they do not drink) and feed on organic matter (including shed human and animal skin). Dust mites are a microscopic type of insect that you cannot see with the naked eye. High levels of dust mites have been detected from mattresses, pillows, carpets, upholstered furniture, bed covers, clothes, soft toys and any woven material. The principal allergen of the  dust mite is found in its feces. A gram of dust may contain 1,000 mites and 250,000 fecal particles. Mite antigen is easily measured in the air during house cleaning activities. Dust mites do not bite and do not cause harm to humans, other than by triggering allergies/asthma.  Ways to decrease your exposure to dust mites in your home:  1. Encase mattresses, box springs and pillows with a  mite-impermeable barrier or cover  2. Wash sheets, blankets and drapes weekly in hot water (130 F) with detergent and dry them in a dryer on the hot setting.  3. Have the room cleaned frequently with a vacuum cleaner and a damp dust-mop. For carpeting or rugs, vacuuming with a vacuum cleaner equipped with a high-efficiency particulate air (HEPA) filter. The dust mite allergic individual should not be in a room which is being cleaned and should wait 1 hour after cleaning before going into the room.  4. Do not sleep on upholstered furniture (eg, couches).  5. If possible removing carpeting, upholstered furniture and drapery from the home is ideal. Horizontal blinds should be eliminated in the rooms where the person spends the most time (bedroom, study, television room). Washable vinyl, roller-type shades are optimal.  6. Remove all non-washable stuffed toys from the bedroom. Wash stuffed toys weekly like sheets and blankets above.  7. Reduce indoor humidity to less than 50%. Inexpensive humidity monitors can be purchased at most hardware stores. Do not use a humidifier as can make the problem worse and are not recommended.

## 2022-10-08 NOTE — Progress Notes (Signed)
[place] [address] Dept: [phone]  FOLLOW UP NOTE  Patient ID: Brandi Lester, female    DOB: [birth year]  Age: 57 y.o. MRN: [id number] Date of Office Visit: 10/08/2022  Assessment  Chief Complaint: Follow-up (Having sneezing, nasal drainage, runny and itchy eyes, headaches, itchy throat, sore/ tender on temple and under eyes)  HPI Brandi Lester is a 57 year old female who presents to the clinic for a follow up visit. She was last seen in this clinic on 04/17/2022 by Dr. Ernst Bowler for evaluation of allergic rhinitis, frequent sinusitis, and reflux.  At today's visit, she reports her allergic rhinitis has been poorly controlled with symptoms including sneezing, clear rhinorrhea, cough producing thick mucus, headache across her forehead, and postnasal drainage which began on Friday night to Saturday morning.  She denies any new circumstances in her life such as dogs, new carpets, or new breaks.  She continues cetirizine 10 mg once a day with occasional Benadryl, Nasacort, azelastine, and saline nasal rinses.  She continues Mucinex as needed for thick postnasal drainage.  She reports that she has taken cetirizine for greater than 1 year at this time.  She does report excellent nasal spray application technique.  Her last environmental allergy testing was on 01/11/2022 and was positive to grass pollen, ragweed pollen, dog, dust mite, mold, and cockroach.  She reports that she has previously taken allergy injections, however, she is interested in making medication changes at this time and possibly beginning allergy injections in the future.  She reports that she has not needed an antibiotic or prednisone since her last visit to this clinic for treatment of sinusitis.  Reflux is reported as well-controlled with no symptoms including heartburn or vomiting.  She continues pantoprazole daily . Her current medications are listed in the chart.   Drug Allergies:  Allergies   Allergen Reactions   Penicillins Itching and Swelling    Has patient had a PCN reaction causing immediate rash, facial/tongue/throat swelling, SOB or lightheadedness with hypotension: yes Has patient had a PCN reaction causing severe rash involving mucus membranes or skin necrosis: no Has patient had a PCN reaction that required hospitalization no Has patient had a PCN reaction occurring within the last 10 years: yes If all of the above answers are "NO", then may proceed with Cephalosporin use.    Victoza [Liraglutide]     Vomiting    Physical Exam: BP 134/70   Pulse 87   Temp 98.4 F (36.9 C) (Temporal)   Resp 16   Wt 278 lb 12.8 oz (126.5 kg)   SpO2 98%   BMI 40.00 kg/m    Physical Exam Vitals reviewed.  Constitutional:      Appearance: Normal appearance.  HENT:     Head: Normocephalic and atraumatic.     Right Ear: Tympanic membrane normal.     Left Ear: Tympanic membrane normal.     Nose:     Comments: Bilateral nares edematous and pale with clear nasal drainage noted.  Pharynx slightly erythematous with no exudate.  Ears normal.  Eyes normal. Eyes:     Conjunctiva/sclera: Conjunctivae normal.  Cardiovascular:     Rate and Rhythm: Normal rate and regular rhythm.     Heart sounds: Normal heart sounds. No murmur heard. Pulmonary:     Effort: Pulmonary effort is normal.     Breath sounds: Normal breath sounds.  Musculoskeletal:        General: Normal range of motion.  Cervical back: Normal range of motion and neck supple.  Skin:    General: Skin is warm and dry.  Neurological:     Mental Status: She is alert and oriented to person, place, and time.  Psychiatric:        Mood and Affect: Mood normal.        Behavior: Behavior normal.        Thought Content: Thought content normal.        Judgment: Judgment normal.     Assessment and Plan: 1. Gastroesophageal reflux disease, unspecified whether esophagitis present   2. Seasonal and perennial allergic  rhinitis   3. Recurrent sinusitis     Meds ordered this encounter  Medications   Fluticasone Propionate (XHANCE) 93 MCG/ACT EXHU    Sig: spray 2 sprays in each nostril twice a day for nasal congestion    Dispense:  16 mL    Refill:  5    Contact info: [phone]   Carbinoxamine Maleate 4 MG TABS    Sig: Take 1 tablet (4 mg total) by mouth 4 (four) times daily as needed.    Dispense:  28 tablet    Refill:  0    Patient Instructions  Allergic rhinitis Continue allergen avoidance measures directed toward grass pollen, ragweed pollen, dog, and dust mite as listed below Begin carbinoxamine 4 mg tablets.  Take 2 tablets in the morning and 2 tablets in the evening if needed for nasal symptoms. We may need to change your antihistamine once or twice a year for best results.  Some examples of over the counter antihistamines include Zyrtec (cetirizine), Xyzal (levocetirizine), Allegra (fexofenadine), and Claritin (loratidine) Begin Xhance nasal spray 2 sprays in each nostril twice a day for nasal congestion Consider saline nasal rinses as needed for nasal symptoms. Use this before any medicated nasal sprays for best result Consider allergen immunotherapy if the treatment plan as listed above is not controlling your symptoms.  Call the clinic if this treatment plan is not working well for you  Recurrent sinusitis Keep track of infections, antibiotic use, and steroid use  Reflux Continue dietary and lifestyle modifications as listed below Continue pantoprazole as previously prescribed  Follow up in 3 months or sooner if needed.   Return in about 3 months (around 01/06/2023), or if symptoms worsen or fail to improve.    Thank you for the opportunity to care for this patient.  Please do not hesitate to contact me with questions.  Gareth Morgan, FNP [facility]

## 2022-10-09 NOTE — Progress Notes (Signed)
Chief Complaint:   OBESITY Brandi Lester is here to discuss her progress with her obesity treatment plan along with follow-up of her obesity related diagnoses. Brandi Lester is on the Category 4 Plan and states she is following her eating plan approximately 50% of the time. Brandi Lester states she is biking/walking 30-45 minutes 3-4 times per week.  Today's visit was #: 54 Starting weight: 325 lbs Starting date: 05/23/2017 Today's weight: 275 lbs Today's date: 10/01/2022 Total lbs lost to date: 50 lbs Total lbs lost since last in-office visit: 0  Interim History: Brandi Lester has been working to get back on track with her nutrition plan after some recent get-togethers where she endorses eating off plan.  She switched from [facility] 12.5 mg in July 2023, and has been on max dose of Ozempic 2 mg weekly since then, but felt that Darcel Bayley was much more effective at helping control her appetite and hunger.  She reports no side effect with either the Ozempic or the [facility].  She does feel like she is struggling more and has tried the low-carb nutrition plan in the past and this has helped kick start her weight loss and she would like to switch to a low-carb for a period of time.  Subjective:   1. Type 2 diabetes mellitus with other specified complication, without long-term current use of insulin (HCC) Brandi Lester is on Ozempic 2 mg weekly.  Denies any side effects.  On Metformin 500 mg twice a day-Denies any side effects.  She feels she did much better on the [facility] and felt that there was much better appetite suppression and hunger control with [name].  She is continue to work on decreasing simple carbs, increasing lean protein and exercise to promote weight loss, and improve glycemic control.  2. Other depression, with emotional eating Brandi Lester is on Wellbutrin SR 200 mg daily.  Feels it helps to decrease emotional eating behavior.  Mood is stable.  Assessment/Plan:   1. Type 2 diabetes mellitus with other  specified complication, without long-term current use of insulin (HCC) We discussed switching back to [facility] and the patient would like to try to switch back to [facility] and we discussed starting [name] pending prior approval.   Start [name] 5 mg SQ once a week for 1 month with 0 refills.  -Start tirzepatide [facility]) 5 MG/0.5ML Pen; Inject 5 mg into the skin once a week.  Dispense: 6 mL; Refill: 0  2. Other depression, with emotional eating Continue Wellbutrin and discussed strategies to decrease emotional eating behavior.  No refills needed today.  3. Obesity: BMI 39.5 Brandi Lester is currently in the action stage of change. As such, her goal is to continue with weight loss efforts. She has agreed to the Category 4 Plan and following a lower carbohydrate, vegetable and lean protein rich diet plan.   Exercise goals: As is.  Behavioral modification strategies: increasing lean protein intake, decreasing simple carbohydrates, meal planning and cooking strategies, emotional eating strategies, and planning for success.  Brandi Lester has agreed to follow-up with our clinic in 4 weeks. She was informed of the importance of frequent follow-up visits to maximize her success with intensive lifestyle modifications for her multiple health conditions.   Objective:   Blood pressure 115/72, pulse 86, temperature 99.3 F (37.4 C), height 5' 10"$  (1.778 m), weight 275 lb (124.7 kg), SpO2 100 %. Body mass index is 39.46 kg/m.  General: Cooperative, alert, well developed, in no acute distress. HEENT: Conjunctivae and lids unremarkable. Cardiovascular: Regular rhythm.  Lungs: Normal work of breathing. Neurologic: No focal deficits.   Lab Results  Component Value Date   CREATININE 0.79 08/10/2022   BUN 12 08/10/2022   NA 139 08/10/2022   K 3.8 08/10/2022   CL 104 08/10/2022   CO2 22 08/10/2022   Lab Results  Component Value Date   ALT 12 08/10/2022   AST 11 08/10/2022   ALKPHOS 74 06/18/2022    BILITOT 0.3 08/10/2022   Lab Results  Component Value Date   HGBA1C 5.7 06/18/2022   HGBA1C 5.8 12/14/2021   HGBA1C 5.5 06/06/2021   HGBA1C 5.6 11/28/2020   HGBA1C 6.1 03/21/2020   Lab Results  Component Value Date   INSULIN 20.7 12/01/2019   INSULIN 12.9 07/07/2019   INSULIN 21.2 11/20/2018   INSULIN 15.4 07/14/2018   INSULIN 12.9 09/12/2017   Lab Results  Component Value Date   TSH 1.18 06/18/2022   Lab Results  Component Value Date   CHOL 100 06/18/2022   HDL 32.40 (L) 06/18/2022   LDLCALC 53 06/18/2022   TRIG 73.0 06/18/2022   CHOLHDL 3 06/18/2022   Lab Results  Component Value Date   VD25OH 68.76 06/18/2022   VD25OH 90.29 12/14/2021   VD25OH 78.96 06/06/2021   Lab Results  Component Value Date   WBC 5.2 06/18/2022   HGB 12.8 06/18/2022   HCT 36.2 06/18/2022   MCV 86.9 06/18/2022   PLT 263.0 06/18/2022   No results found for: "IRON", "TIBC", "FERRITIN"  Attestation Statements:   Reviewed by clinician on day of visit: allergies, medications, problem list, medical history, surgical history, family history, social history, and previous encounter notes.  I, Brendell Tyus, am acting as transcriptionist for [employer], [employer].  I have reviewed the above documentation for accuracy and completeness, and I agree with the above. -  Fradel Baldonado,PA-C

## 2022-10-10 ENCOUNTER — Encounter: Payer: Self-pay | Admitting: Cardiology

## 2022-10-18 ENCOUNTER — Ambulatory Visit: Payer: BC Managed Care – PPO | Admitting: Allergy & Immunology

## 2022-10-19 ENCOUNTER — Ambulatory Visit
Admission: RE | Admit: 2022-10-19 | Discharge: 2022-10-19 | Disposition: A | Discharge status: Home / Self Care | Payer: BC Managed Care – PPO | Source: Ambulatory Visit

## 2022-10-19 DIAGNOSIS — Z1231 Encounter for screening mammogram for malignant neoplasm of breast: Secondary | ICD-10-CM

## 2022-10-21 NOTE — Assessment & Plan Note (Signed)
Supplement and monitor 

## 2022-10-21 NOTE — Assessment & Plan Note (Signed)
Well controlled, no changes to meds. Encouraged heart healthy diet such as the DASH diet and exercise as tolerated.  

## 2022-10-21 NOTE — Assessment & Plan Note (Signed)
Encourage heart healthy diet such as MIND or DASH diet, increase exercise, avoid trans fats, simple carbohydrates and processed foods, consider a krill or fish or flaxseed oil cap daily. Tolerating Atorvastatin

## 2022-10-21 NOTE — Assessment & Plan Note (Addendum)
hgba1c acceptable, minimize simple carbs. Increase exercise as tolerated. Continue current meds but increase strength of [name].

## 2022-10-21 NOTE — Assessment & Plan Note (Signed)
Encouraged DASH or MIND diet, decrease po intake and increase exercise as tolerated. Needs 7-8 hours of sleep nightly. Avoid trans fats, eat small, frequent meals every 4-5 hours with lean proteins, complex carbs and healthy fats. Minimize simple carbs, high fat foods and processed foods

## 2022-10-22 ENCOUNTER — Ambulatory Visit (INDEPENDENT_AMBULATORY_CARE_PROVIDER_SITE_OTHER): Payer: BC Managed Care – PPO | Admitting: Family Medicine

## 2022-10-22 ENCOUNTER — Other Ambulatory Visit (HOSPITAL_BASED_OUTPATIENT_CLINIC_OR_DEPARTMENT_OTHER): Payer: Self-pay

## 2022-10-22 VITALS — BP 128/68 | HR 93 | Temp 98.0°F | Resp 16 | Ht 70.0 in | Wt 282.4 lb

## 2022-10-22 DIAGNOSIS — I152 Hypertension secondary to endocrine disorders: Secondary | ICD-10-CM

## 2022-10-22 DIAGNOSIS — E669 Obesity, unspecified: Secondary | ICD-10-CM | POA: Diagnosis not present

## 2022-10-22 DIAGNOSIS — E785 Hyperlipidemia, unspecified: Secondary | ICD-10-CM | POA: Diagnosis not present

## 2022-10-22 DIAGNOSIS — E1169 Type 2 diabetes mellitus with other specified complication: Secondary | ICD-10-CM | POA: Diagnosis not present

## 2022-10-22 DIAGNOSIS — E1159 Type 2 diabetes mellitus with other circulatory complications: Secondary | ICD-10-CM

## 2022-10-22 DIAGNOSIS — J329 Chronic sinusitis, unspecified: Secondary | ICD-10-CM

## 2022-10-22 DIAGNOSIS — Z6841 Body Mass Index (BMI) 40.0 and over, adult: Secondary | ICD-10-CM | POA: Diagnosis not present

## 2022-10-22 DIAGNOSIS — R5383 Other fatigue: Secondary | ICD-10-CM

## 2022-10-22 DIAGNOSIS — E559 Vitamin D deficiency, unspecified: Secondary | ICD-10-CM

## 2022-10-22 DIAGNOSIS — G479 Sleep disorder, unspecified: Secondary | ICD-10-CM

## 2022-10-22 LAB — COMPREHENSIVE METABOLIC PANEL
ALT: 15 U/L (ref 0–35)
AST: 11 U/L (ref 0–37)
Albumin: 4.2 g/dL (ref 3.5–5.2)
Alkaline Phosphatase: 88 U/L (ref 39–117)
BUN: 6 mg/dL (ref 6–23)
CO2: 27 mEq/L (ref 19–32)
Calcium: 9.6 mg/dL (ref 8.4–10.5)
Chloride: 104 mEq/L (ref 96–112)
Creatinine, Ser: 0.7 mg/dL (ref 0.40–1.20)
GFR: 98.04 mL/min (ref >60.00)
Glucose, Bld: 99 mg/dL (ref 70–99)
Potassium: 3.9 mEq/L (ref 3.5–5.1)
Sodium: 140 mEq/L (ref 135–145)
Total Bilirubin: 0.4 mg/dL (ref 0.2–1.2)
Total Protein: 7.2 g/dL (ref 6.0–8.3)

## 2022-10-22 LAB — CBC WITH DIFFERENTIAL/PLATELET
Basophils Absolute: 0.1 10*3/uL (ref 0.0–0.1)
Basophils Relative: 1 % (ref 0.0–3.0)
Eosinophils Absolute: 0.1 10*3/uL (ref 0.0–0.7)
Eosinophils Relative: 2.6 % (ref 0.0–5.0)
HCT: 38.3 % (ref 36.0–46.0)
Hemoglobin: 13.1 g/dL (ref 12.0–15.0)
Lymphocytes Relative: 16.1 % (ref 12.0–46.0)
Lymphs Abs: 0.9 10*3/uL (ref 0.7–4.0)
MCHC: 34.1 g/dL (ref 30.0–36.0)
MCV: 88.9 fl (ref 78.0–100.0)
Monocytes Absolute: 0.7 10*3/uL (ref 0.1–1.0)
Monocytes Relative: 12.9 % — ABNORMAL HIGH (ref 3.0–12.0)
Neutro Abs: 3.7 10*3/uL (ref 1.4–7.7)
Neutrophils Relative %: 67.4 % (ref 43.0–77.0)
Platelets: 342 10*3/uL (ref 150.0–400.0)
RBC: 4.32 Mil/uL (ref 3.87–5.11)
RDW: 14.5 % (ref 11.5–15.5)
WBC: 5.5 10*3/uL (ref 4.0–10.5)

## 2022-10-22 LAB — LIPID PANEL
Cholesterol: 114 mg/dL (ref 0–200)
HDL: 39.5 mg/dL (ref >39.00)
LDL Cholesterol: 60 mg/dL (ref 0–99)
NonHDL: 74.35
Total CHOL/HDL Ratio: 3
Triglycerides: 73 mg/dL (ref 0.0–149.0)
VLDL: 14.6 mg/dL (ref 0.0–40.0)

## 2022-10-22 LAB — VITAMIN D 25 HYDROXY (VIT D DEFICIENCY, FRACTURES): VITD: 77.64 ng/mL (ref 30.00–100.00)

## 2022-10-22 LAB — HEMOGLOBIN A1C: Hgb A1c MFr Bld: 5.5 % (ref 4.6–6.5)

## 2022-10-22 LAB — TSH: TSH: 1.47 u[IU]/mL (ref 0.35–5.50)

## 2022-10-22 MED ORDER — DOXYCYCLINE HYCLATE 100 MG PO TABS
100.0000 mg | ORAL_TABLET | Freq: Two times a day (BID) | ORAL | 0 refills | Status: DC
  Filled 2022-10-22: qty 20, 10d supply, fill #0

## 2022-10-22 NOTE — Patient Instructions (Signed)
Fatigue If you have fatigue, you feel tired all the time and have a lack of energy or a lack of motivation. Fatigue may make it difficult to start or complete tasks because of exhaustion. Occasional or mild fatigue is often a normal response to activity or life. However, long-term (chronic) or extreme fatigue may be a symptom of a medical condition such as: Depression. Not having enough red blood cells or hemoglobin in the blood (anemia). A problem with a small gland located in the lower front part of the neck (thyroid disorder). Rheumatologic conditions. These are problems related to the body's defense system (immune system). Infections, especially certain viral infections. Fatigue can also lead to negative health outcomes over time. Follow these instructions at home: Medicines Take over-the-counter and prescription medicines only as told by your health care provider. Take a multivitamin if told by your health care provider. Do not use herbal or dietary supplements unless they are approved by your health care provider. Eating and drinking  Avoid heavy meals in the evening. Eat a well-balanced diet, which includes lean proteins, whole grains, plenty of fruits and vegetables, and low-fat dairy products. Avoid eating or drinking too many products with caffeine in them. Avoid alcohol. Drink enough fluid to keep your urine pale yellow. Activity  Exercise regularly, as told by your health care provider. Use or practice techniques to help you relax, such as yoga, tai chi, meditation, or massage therapy. Lifestyle Change situations that cause you stress. Try to keep your work and personal schedules in balance. Do not use recreational or illegal drugs. General instructions Monitor your fatigue for any changes. Go to bed and get up at the same time every day. Avoid fatigue by pacing yourself during the day and getting enough sleep at night. Maintain a healthy weight. Contact a health care  provider if: Your fatigue does not get better. You have a fever. You suddenly lose or gain weight. You have headaches. You have trouble falling asleep or sleeping through the night. You feel angry, guilty, anxious, or sad. You have swelling in your legs or another part of your body. Get help right away if: You feel confused, feel like you might faint, or faint. Your vision is blurry or you have a severe headache. You have severe pain in your abdomen, your back, or the area between your waist and hips (pelvis). You have chest pain, shortness of breath, or an irregular or fast heartbeat. You are unable to urinate, or you urinate less than normal. You have abnormal bleeding from the rectum, nose, lungs, nipples, or, if you are female, the vagina. You vomit blood. You have thoughts about hurting yourself or others. These symptoms may be an emergency. Get help right away. Call 911. Do not wait to see if the symptoms will go away. Do not drive yourself to the hospital. Get help right away if you feel like you may hurt yourself or others, or have thoughts about taking your own life. Go to your nearest emergency room or: Call 911. Call the National Suicide Prevention Lifeline at 1-800-273-8255 or 988. This is open 24 hours a day. Text the Crisis Text Line at 741741. Summary If you have fatigue, you feel tired all the time and have a lack of energy or a lack of motivation. Fatigue may make it difficult to start or complete tasks because of exhaustion. Long-term (chronic) or extreme fatigue may be a symptom of a medical condition. Exercise regularly, as told by your health care provider.   Change situations that cause you stress. Try to keep your work and personal schedules in balance. This information is not intended to replace advice given to you by your health care provider. Make sure you discuss any questions you have with your health care provider. Document Revised: 06/12/2021 Document  Reviewed: 06/12/2021 Elsevier Patient Education  2023 Elsevier Inc.  

## 2022-10-23 ENCOUNTER — Ambulatory Visit: Payer: BC Managed Care – PPO | Admitting: Family Medicine

## 2022-10-23 ENCOUNTER — Other Ambulatory Visit (HOSPITAL_BASED_OUTPATIENT_CLINIC_OR_DEPARTMENT_OTHER): Payer: Self-pay

## 2022-10-23 LAB — INSULIN, RANDOM: Insulin: 25.4 u[IU]/mL — ABNORMAL HIGH

## 2022-10-24 ENCOUNTER — Other Ambulatory Visit (HOSPITAL_BASED_OUTPATIENT_CLINIC_OR_DEPARTMENT_OTHER): Payer: Self-pay

## 2022-10-24 ENCOUNTER — Encounter (INDEPENDENT_AMBULATORY_CARE_PROVIDER_SITE_OTHER): Payer: Self-pay | Admitting: Physician Assistant

## 2022-10-24 ENCOUNTER — Ambulatory Visit (INDEPENDENT_AMBULATORY_CARE_PROVIDER_SITE_OTHER): Payer: BC Managed Care – PPO | Admitting: Physician Assistant

## 2022-10-24 VITALS — BP 119/75 | HR 93 | Temp 98.5°F | Ht 70.0 in | Wt 277.0 lb

## 2022-10-24 DIAGNOSIS — E1159 Type 2 diabetes mellitus with other circulatory complications: Secondary | ICD-10-CM

## 2022-10-24 DIAGNOSIS — F3289 Other specified depressive episodes: Secondary | ICD-10-CM

## 2022-10-24 DIAGNOSIS — I152 Hypertension secondary to endocrine disorders: Secondary | ICD-10-CM | POA: Diagnosis not present

## 2022-10-24 DIAGNOSIS — Z7984 Long term (current) use of oral hypoglycemic drugs: Secondary | ICD-10-CM

## 2022-10-24 DIAGNOSIS — E559 Vitamin D deficiency, unspecified: Secondary | ICD-10-CM

## 2022-10-24 DIAGNOSIS — Z7985 Long-term (current) use of injectable non-insulin antidiabetic drugs: Secondary | ICD-10-CM

## 2022-10-24 DIAGNOSIS — Z6839 Body mass index (BMI) 39.0-39.9, adult: Secondary | ICD-10-CM

## 2022-10-24 DIAGNOSIS — E1169 Type 2 diabetes mellitus with other specified complication: Secondary | ICD-10-CM | POA: Diagnosis not present

## 2022-10-24 DIAGNOSIS — E669 Obesity, unspecified: Secondary | ICD-10-CM

## 2022-10-24 MED ORDER — BUPROPION HCL ER (SR) 200 MG PO TB12
200.0000 mg | ORAL_TABLET | Freq: Every morning | ORAL | 0 refills | Status: DC
  Filled 2022-10-24 – 2022-11-21 (×2): qty 90, 90d supply, fill #0

## 2022-10-24 MED ORDER — TIRZEPATIDE 7.5 MG/0.5ML ~~LOC~~ SOAJ
7.5000 mg | SUBCUTANEOUS | 0 refills | Status: DC
  Filled 2022-10-24: qty 2, 28d supply, fill #0

## 2022-10-24 MED ORDER — METFORMIN HCL 500 MG PO TABS
500.0000 mg | ORAL_TABLET | Freq: Two times a day (BID) | ORAL | 0 refills | Status: DC
  Filled 2022-10-24 – 2022-11-21 (×2): qty 180, 90d supply, fill #0

## 2022-10-24 MED ORDER — ONDANSETRON 4 MG PO TBDP
4.0000 mg | ORAL_TABLET | Freq: Three times a day (TID) | ORAL | 0 refills | Status: DC | PRN
  Filled 2022-10-24: qty 18, 6d supply, fill #0

## 2022-10-24 NOTE — Progress Notes (Signed)
Chief Complaint:   OBESITY Precious is here to discuss her progress with her obesity treatment plan along with follow-up of her obesity related diagnoses. Yaileen is on the Category 4 Plan and following a lower carbohydrate, vegetable and lean protein rich diet plan and states she is following her eating plan approximately 50% of the time. [name] states she is Biking  30-45 minutes 3 times per week.  Today's visit was #: 25 Starting weight: 325 lbs Starting date: 05/23/2017 Today's weight: 277 lbs Today's date: 10/24/2022 Total lbs lost to date: 48 lbs Total lbs lost since last in-office visit: +2 lbs  Interim History: Payden has done well with weight loss overall. Her brother was killed in a MVC earlier this month. He was 55 yrs old and the family has planned a [employer] in [place]. She has been doing some comfort eating- jelly beans.  Hunger and appetite okay, but feels needs to increase [name].  Sleeping poorly after her brother's death, but a little better over the past few days.  We discussed trying to focus on protein goals over the next few weeks.   Subjective:   1. Type 2 diabetes mellitus with other specified complication, without long-term current use of insulin ([place]) Labs reviewed with the patient today. On [name] 5 mg daily and Metformin 500 mg daily. No side effects- No nausea, no vomiting, no diarrhea or constipation. No mass of neck or difficulty swallowing.  A1C 5.5- at goal. Insulin 25.4- not at goal and likely due to increased simple carbohydrates and patient working to get back on track with nutrition plan.  Wants to have Zofran available just incase the increased [name] causes any nausea.   2. Hypertension associated with type 2 diabetes mellitus ([place]) Labs reviewed with patient. BP at goal. Renal function is normal. She is on amlodipine 10 mg daily, carvedilol 25 mg daily and lasix as needed for leg edema. No side effects with medications. No  symptoms of hypotension.   3. Vitamin D deficiency Labs reviewed with patient. Level 77.64 . On OTC vitamin D 1000 iu 2 tablets daily.  We discussed decreasing OTC vitamin D to 1000 iu 1 tablet daily.   4. Other depression, with emotional eating She is doing some comfort eating and we discussed continuing Wellbutrin SR 200 mg daily. She is grieving appropriately, but will let us know if she feels she needs more supports/counseling or other help to get through this difficult time.   5. Obesity ([place])- Start BMI 47.99   6. BMI 39.0-39.9,adult, Current BMI 39.8    Assessment/Plan:   1. Type 2 diabetes mellitus with other specified complication, without long-term current use of insulin (HCC) Will increase [name] to 7 mg weekly.  REFILL- ondansetron (ZOFRAN-ODT) 4 MG disintegrating tablet; Take 1 tablet (4 mg total) by mouth every 8 (eight) hours as needed for nausea or vomiting.  Dispense: 20 tablet; Refill: 0 INCREASE/REFILL- tirzepatide ([name]) 7.5 MG/0.5ML Pen; Inject 7.5 mg into the skin once a week.  Dispense: 6 mL; Refill: 0 REFILL- metFORMIN (GLUCOPHAGE) 500 MG tablet; Take 1 tablet (500 mg total) by mouth 2 (two) times daily with a meal.  Dispense: 180 tablet; Refill: 0  Continue nutrition plan to decrease simple carbohydrates, increase lean protein and exercise to promote weight loss and improve glycemic control.   2. Hypertension associated with type 2 diabetes mellitus ([place]) Continue medications, amlodipine, carvedilol, lasix. Will monitor for hypotension. Monitor renal function.   3. Vitamin D deficiency  Will decrease OTC vitamin D to 1000 iu daily from 2000 iu daily.  Will recheck level again in 3- 4 months to avoid over supplementation.   4. Other depression, with emotional eating  Support provided and patient will reach out should she need more supports.  She will continue Wellbutrin SR 200 mg daily and emotional eating strategies were discussed.  - buPROPion  (WELLBUTRIN SR) 200 MG 12 hr tablet; Take 1 tablet (200 mg total) by mouth in the morning.  Dispense: 90 tablet; Refill: 0  5. Obesity ([place])- Start BMI 47.99   6. BMI 39.0-39.9,adult, Current BMI 39.8   Kylah is currently in the action stage of change. As such, her goal is to continue with weight loss efforts. She has agreed to the Category 4 Plan and following a lower carbohydrate, vegetable and lean protein rich diet plan.   Exercise goals: All adults should avoid inactivity. Some physical activity is better than none, and adults who participate in any amount of physical activity gain some health benefits.  Behavioral modification strategies: increasing lean protein intake, decreasing simple carbohydrates, increasing water intake, no skipping meals, and planning for success.  Bibiana has agreed to follow-up with our clinic in 3 weeks. She was informed of the importance of frequent follow-up visits to maximize her success with intensive lifestyle modifications for her multiple health conditions.     Objective:   Blood pressure 119/75, pulse 93, temperature 98.5 F (36.9 C), height 5' 10"$  (1.778 m), weight 277 lb (125.6 kg), SpO2 100 %. Body mass index is 39.75 kg/m.  General: Cooperative, alert, well developed, in no acute distress. HEENT: Conjunctivae and lids unremarkable. Cardiovascular: Regular rhythm.  Lungs: Normal work of breathing. Neurologic: No focal deficits.   Lab Results  Component Value Date   CREATININE 0.70 10/22/2022   BUN 6 10/22/2022   NA 140 10/22/2022   K 3.9 10/22/2022   CL 104 10/22/2022   CO2 27 10/22/2022   Lab Results  Component Value Date   ALT 15 10/22/2022   AST 11 10/22/2022   ALKPHOS 88 10/22/2022   BILITOT 0.4 10/22/2022   Lab Results  Component Value Date   HGBA1C 5.5 10/22/2022   HGBA1C 5.7 06/18/2022   HGBA1C 5.8 12/14/2021   HGBA1C 5.5 06/06/2021   HGBA1C 5.6 11/28/2020   Lab Results  Component Value Date   INSULIN 20.7  12/01/2019   INSULIN 12.9 07/07/2019   INSULIN 21.2 11/20/2018   INSULIN 15.4 07/14/2018   INSULIN 12.9 09/12/2017   Lab Results  Component Value Date   TSH 1.47 10/22/2022   Lab Results  Component Value Date   CHOL 114 10/22/2022   HDL 39.50 10/22/2022   LDLCALC 60 10/22/2022   TRIG 73.0 10/22/2022   CHOLHDL 3 10/22/2022   Lab Results  Component Value Date   VD25OH 77.64 10/22/2022   VD25OH 68.76 06/18/2022   VD25OH 90.29 12/14/2021   Lab Results  Component Value Date   WBC 5.5 10/22/2022   HGB 13.1 10/22/2022   HCT 38.3 10/22/2022   MCV 88.9 10/22/2022   PLT 342.0 10/22/2022  No results found for: "IRON", "TIBC", "FERRITIN"  Obesity Behavioral Intervention:   Approximately 15 minutes were spent on the discussion below.  ASK: We discussed the diagnosis of obesity with [place] today and [place] agreed to give [name] permission to discuss obesity behavioral modification therapy today.  ASSESS: Anala has the diagnosis of obesity and her BMI today is 39.8. Jonique is in the action stage  of change.   ADVISE: Lukesha was educated on the multiple health risks of obesity as well as the benefit of weight loss to improve her health. She was advised of the need for long term treatment and the importance of lifestyle modifications to improve her current health and to decrease her risk of future health problems.  AGREE: Multiple dietary modification options and treatment options were discussed and Aymara agreed to follow the recommendations documented in the above note.  ARRANGE: Ludean was educated on the importance of frequent visits to treat obesity as outlined per CMS and USPSTF guidelines and agreed to schedule her next follow up appointment today.  Attestation Statements:   Reviewed by clinician on day of visit: allergies, medications, problem list, medical history, surgical history, family history, social history, and previous encounter  notes.  Jamilyn Pigeon,PA-C

## 2022-10-28 DIAGNOSIS — R5383 Other fatigue: Secondary | ICD-10-CM | POA: Insufficient documentation

## 2022-10-28 NOTE — Assessment & Plan Note (Signed)
Doxycycline and Mucinex bid.

## 2022-10-28 NOTE — Assessment & Plan Note (Signed)
Encouraged DASH or MIND diet, decrease po intake and increase exercise as tolerated. Needs 7-8 hours of sleep nightly. Avoid trans fats, eat small, frequent meals every 4-5 hours with lean proteins, complex carbs and healthy fats. Minimize simple carbs, high fat foods and processed foods 

## 2022-10-28 NOTE — Progress Notes (Signed)
Subjective:    Patient ID: Brandi Lester, female    DOB: [birth year], 57 y.o.   MRN: [id number]  Chief Complaint  Patient presents with   Follow-up    Follow up    HPI Patient is in today for follow up on chronic medical concerns. No recent febrile illness or acute hospitalizations. No complaints of polyuria or polydipsia. Her allergies and congestion are bothering her. She notes green sputum and sinus pressure as well as post nasal drip. Denies CP/palp/SOB/HA/fevers/GI or GU c/o. Taking meds as prescribed. Tolerating [name].   Past Medical History:  Diagnosis Date   ADD (attention deficit disorder)    Allergy    allergic rhinitis   Anemia 07/25/2013   Angio-edema    Anxiety    Arthritis    not dx'd   Back pain    Costochondritis 02/20/2015   Diabetes mellitus    Type II   previously 3 years ago - no longer on meds    Dyslipidemia 07/25/2013   Fungus infection 06/2012   Left great toe   GERD (gastroesophageal reflux disease)    Hypertension    IBS (irritable bowel syndrome)    Ingrown nail 06/2012   right foot next to the last toe   Insomnia 04/20/2013   Joint pain    Lactose intolerance    Leg edema    Obesity    Pedal edema 02/20/2015   Prediabetes    Preventative health care 09/25/2015   PVC (premature ventricular contraction)    Recurrent upper respiratory infection (URI)    Urticaria     Past Surgical History:  Procedure Laterality Date   HYSTEROSCOPY N/A 10/04/2015   Procedure: HYSTEROSCOPY with Removal IUD;  Surgeon: Vanessa Kick, MD;  Location: [facility];  Service: Gynecology;  Laterality: N/A;   lasik     eye surgery   WISDOM TOOTH EXTRACTION      Family History  Problem Relation Age of Onset   Allergic rhinitis Mother    Heart attack Mother 10   Hypertension Mother    Heart disease Mother    Obesity Mother    Cancer Father        prostate   Prostate cancer Father    Allergic rhinitis Sister    Heart attack Sister 39   Allergic  rhinitis Sister    Allergic rhinitis Sister    Allergic rhinitis Maternal Grandmother    Hypertension Other    Colon cancer Neg Hx    Colon polyps Neg Hx    Esophageal cancer Neg Hx    Stomach cancer Neg Hx    Rectal cancer Neg Hx     Social History   Socioeconomic History   Marital status: Single    Spouse name: Not on file   Number of children: Not on file   Years of education: Not on file   Highest education level: Not on file  Occupational History   Occupation: Pharmacist, hospital, [employer]  Tobacco Use   Smoking status: Former    Packs/day: 1.00    Years: 15.00    Total pack years: 15.00    Types: Cigarettes    Quit date: 09/03/2001    Years since quitting: 21.1    Passive exposure: Never   Smokeless tobacco: Never  Vaping Use   Vaping Use: Never used  Substance and Sexual Activity   Alcohol use: Yes    Comment: rare   Drug use: No   Sexual activity: Yes  Birth control/protection: None  Other Topics Concern   Not on file  Social History Narrative   Not on file   Social Determinants of Health   Financial Resource Strain: Not on file  Food Insecurity: Not on file  Transportation Needs: Not on file  Physical Activity: Not on file  Stress: Not on file  Social Connections: Not on file  Intimate Partner Violence: Not on file    Outpatient Medications Prior to Visit  Medication Sig Dispense Refill   acetaminophen (TYLENOL 8 HOUR) 650 MG CR tablet Take 1 tablet (650 mg total) every 8 (eight) hours as needed by mouth for pain.     albuterol (VENTOLIN HFA) 108 (90 Base) MCG/ACT inhaler Inhale 2 puffs into the lungs every 6 (six) hours as needed for wheezing or shortness of breath. 6.7 g 1   amLODipine (NORVASC) 10 MG tablet Take 1 tablet (10 mg total) by mouth daily. 90 tablet 3   atorvastatin (LIPITOR) 10 MG tablet Take 1 tablet (10 mg total) by mouth daily. 90 tablet 3   azelastine (ASTELIN) 0.1 % nasal spray Place 2 sprays into both nostrils 2 (two) times daily. 30  mL 5   benzonatate (TESSALON PERLES) 100 MG capsule Take 1-2 capsules (100-200 mg total) by mouth 3 (three) times daily as needed for cough. 40 capsule 1   Carbinoxamine Maleate 4 MG TABS Take 1 tablet (4 mg total) by mouth 4 (four) times daily as needed. 28 tablet 0   carvedilol (COREG) 25 MG tablet Take 1 tablet (25 mg total) by mouth 2 (two) times daily. 180 tablet 3   cholecalciferol (VITAMIN D3) 25 MCG (1000 UT) tablet Take 4,000 Units by mouth daily.     EPINEPHrine (EPIPEN 2-PAK) 0.3 mg/0.3 mL IJ SOAJ injection Use as directed for severe allergic reaction 2 each 1   Fluticasone Propionate (XHANCE) 93 MCG/ACT EXHU spray 2 sprays in each nostril twice a day for nasal congestion 16 mL 5   furosemide (LASIX) 20 MG tablet Take 1 tablet (20 mg total) by mouth daily as needed for edema (weight gain of >3 lbs/24 hours). 30 tablet 3   hydrochlorothiazide (HYDRODIURIL) 25 MG tablet Take 1 tablet (25 mg total) by mouth daily. (Patient taking differently: Take 25 mg by mouth daily. Taking half a pill daily which is 12.5.) 90 tablet 3   ibuprofen (ADVIL,MOTRIN) 600 MG tablet Take 1 tablet (600 mg total) by mouth every 6 (six) hours as needed. 90 tablet 0   losartan (COZAAR) 100 MG tablet Take 1 tablet (100 mg total) by mouth daily. 90 tablet 3   medroxyPROGESTERone (PROVERA) 10 MG tablet TAKE 1 TABLET (10 MG TOTAL) BY MOUTH DAILY. 90 tablet 3   montelukast (SINGULAIR) 10 MG tablet Take 1 tablet (10 mg total) by mouth at bedtime as needed. 90 tablet 1   pantoprazole (PROTONIX) 40 MG tablet Take 1 tablet (40 mg total) by mouth daily. 90 tablet 1   potassium chloride SA (KLOR-CON M) 20 MEQ tablet Take 1 tablet (20 mEq total) by mouth 3 (three) times daily. 270 tablet 1   TURMERIC PO Take 1 capsule by mouth daily.     buPROPion (WELLBUTRIN SR) 200 MG 12 hr tablet Take 1 tablet (200 mg total) by mouth in the morning. 90 tablet 0   metFORMIN (GLUCOPHAGE) 500 MG tablet Take 1 tablet (500 mg total) by mouth 2  (two) times daily with a meal. 180 tablet 0   ondansetron (ZOFRAN-ODT) 4 MG disintegrating  tablet Take 1 tablet (4 mg total) by mouth every 8 (eight) hours as needed for nausea or vomiting. 20 tablet 0   tirzepatide ([name]) 5 MG/0.5ML Pen Inject 5 mg into the skin once a week. 6 mL 0   No facility-administered medications prior to visit.    Allergies  Allergen Reactions   Penicillins Itching and Swelling    Has patient had a PCN reaction causing immediate rash, facial/tongue/throat swelling, SOB or lightheadedness with hypotension: yes Has patient had a PCN reaction causing severe rash involving mucus membranes or skin necrosis: no Has patient had a PCN reaction that required hospitalization no Has patient had a PCN reaction occurring within the last 10 years: yes If all of the above answers are "NO", then may proceed with Cephalosporin use.    Victoza [Liraglutide]     Vomiting    Review of Systems  Constitutional:  Negative for fever and malaise/fatigue.  HENT:  Positive for congestion and sinus pain.   Eyes:  Negative for blurred vision.  Respiratory:  Positive for sputum production. Negative for shortness of breath.   Cardiovascular:  Negative for chest pain, palpitations and leg swelling.  Gastrointestinal:  Negative for abdominal pain, blood in stool and nausea.  Genitourinary:  Negative for dysuria and frequency.  Musculoskeletal:  Negative for falls.  Skin:  Negative for rash.  Neurological:  Negative for dizziness, loss of consciousness and headaches.  Endo/Heme/Allergies:  Negative for environmental allergies.  Psychiatric/Behavioral:  Negative for depression. The patient is not nervous/anxious.        Objective:    Physical Exam Constitutional:      General: She is not in acute distress.    Appearance: Normal appearance. She is well-developed. She is not toxic-appearing.  HENT:     Head: Normocephalic and atraumatic.     Right Ear: External ear normal.      Left Ear: External ear normal.     Nose: Nose normal.  Eyes:     General:        Right eye: No discharge.        Left eye: No discharge.     Conjunctiva/sclera: Conjunctivae normal.  Neck:     Thyroid: No thyromegaly.  Cardiovascular:     Rate and Rhythm: Normal rate and regular rhythm.     Heart sounds: Normal heart sounds. No murmur heard. Pulmonary:     Effort: Pulmonary effort is normal. No respiratory distress.     Breath sounds: Normal breath sounds.  Abdominal:     General: Bowel sounds are normal.     Palpations: Abdomen is soft.     Tenderness: There is no abdominal tenderness. There is no guarding.  Musculoskeletal:        General: Normal range of motion.     Cervical back: Neck supple.  Lymphadenopathy:     Cervical: No cervical adenopathy.  Skin:    General: Skin is warm and dry.  Neurological:     Mental Status: She is alert and oriented to person, place, and time.  Psychiatric:        Mood and Affect: Mood normal.        Behavior: Behavior normal.        Thought Content: Thought content normal.        Judgment: Judgment normal.     BP 128/68 (BP Location: Right Arm, Patient Position: Sitting, Cuff Size: Normal)   Pulse 93   Temp 98 F (36.7 C) (Oral)   Resp  16   Ht '5\' 10"'$  (1.778 m)   Wt 282 lb 6.4 oz (128.1 kg)   SpO2 97%   BMI 40.52 kg/m  Wt Readings from Last 3 Encounters:  10/24/22 277 lb (125.6 kg)  10/22/22 282 lb 6.4 oz (128.1 kg)  10/08/22 278 lb 12.8 oz (126.5 kg)    Diabetic Foot Exam - Simple   No data filed    Lab Results  Component Value Date   WBC 5.5 10/22/2022   HGB 13.1 10/22/2022   HCT 38.3 10/22/2022   PLT 342.0 10/22/2022   GLUCOSE 99 10/22/2022   CHOL 114 10/22/2022   TRIG 73.0 10/22/2022   HDL 39.50 10/22/2022   LDLCALC 60 10/22/2022   ALT 15 10/22/2022   AST 11 10/22/2022   NA 140 10/22/2022   K 3.9 10/22/2022   CL 104 10/22/2022   CREATININE 0.70 10/22/2022   BUN 6 10/22/2022   CO2 27 10/22/2022   TSH 1.47  10/22/2022   HGBA1C 5.5 10/22/2022   MICROALBUR 3.7 (H) 06/18/2022    Lab Results  Component Value Date   TSH 1.47 10/22/2022   Lab Results  Component Value Date   WBC 5.5 10/22/2022   HGB 13.1 10/22/2022   HCT 38.3 10/22/2022   MCV 88.9 10/22/2022   PLT 342.0 10/22/2022   Lab Results  Component Value Date   NA 140 10/22/2022   K 3.9 10/22/2022   CO2 27 10/22/2022   GLUCOSE 99 10/22/2022   BUN 6 10/22/2022   CREATININE 0.70 10/22/2022   BILITOT 0.4 10/22/2022   ALKPHOS 88 10/22/2022   AST 11 10/22/2022   ALT 15 10/22/2022   PROT 7.2 10/22/2022   ALBUMIN 4.2 10/22/2022   CALCIUM 9.6 10/22/2022   GFR 98.04 10/22/2022   Lab Results  Component Value Date   CHOL 114 10/22/2022   Lab Results  Component Value Date   HDL 39.50 10/22/2022   Lab Results  Component Value Date   LDLCALC 60 10/22/2022   Lab Results  Component Value Date   TRIG 73.0 10/22/2022   Lab Results  Component Value Date   CHOLHDL 3 10/22/2022   Lab Results  Component Value Date   HGBA1C 5.5 10/22/2022       Assessment & Plan:  Class 3 severe obesity with serious comorbidity and body mass index (BMI) of 45.0 to 49.9 in adult, unspecified obesity type ([place]) Assessment & Plan: Encouraged DASH or MIND diet, decrease po intake and increase exercise as tolerated. Needs 7-8 hours of sleep nightly. Avoid trans fats, eat small, frequent meals every 4-5 hours with lean proteins, complex carbs and healthy fats. Minimize simple carbs, high fat foods and processed foods   Diabetes mellitus type 2 in obese [facility]) Assessment & Plan: hgba1c acceptable, minimize simple carbs. Increase exercise as tolerated. Continue current meds   Orders: -     Hemoglobin A1c -     Insulin, random  Hypertension associated with type 2 diabetes mellitus ([place]) Assessment & Plan: Well controlled, no changes to meds. Encouraged heart healthy diet such as the DASH diet and exercise as tolerated.    Orders: -     CBC  with Differential/Platelet -     Comprehensive metabolic panel -     TSH  Hyperlipidemia associated with type 2 diabetes mellitus ([place]) Assessment & Plan: Encourage heart healthy diet such as MIND or DASH diet, increase exercise, avoid trans fats, simple carbohydrates and processed foods, consider a krill or fish or  flaxseed oil cap daily. Tolerating Atorvastatin  Orders: -     Lipid panel  Vitamin D deficiency Assessment & Plan: Supplement and monitor   Orders: -     VITAMIN D 25 Hydroxy (Vit-D Deficiency, Fractures)  Restless sleeper -     Ambulatory referral to Pulmonology  Fatigue, unspecified type Assessment & Plan: And restless sleeper. Referred to pulmonology for further consideration. Possible sleep study  Orders: -     Ambulatory referral to Pulmonology  Morbid obesity [facility]) Assessment & Plan: Encouraged DASH or MIND diet, decrease po intake and increase exercise as tolerated. Needs 7-8 hours of sleep nightly. Avoid trans fats, eat small, frequent meals every 4-5 hours with lean proteins, complex carbs and healthy fats. Minimize simple carbs, high fat foods and processed foods    Recurrent sinusitis Assessment & Plan: Doxycycline and Mucinex bid.    Other orders -     Doxycycline Hyclate; Take 1 tablet (100 mg total) by mouth 2 (two) times daily.  Dispense: 20 tablet; Refill: 0    Penni Homans, MD

## 2022-10-28 NOTE — Assessment & Plan Note (Signed)
And restless sleeper. Referred to pulmonology for further consideration. Possible sleep study

## 2022-11-05 ENCOUNTER — Other Ambulatory Visit (HOSPITAL_BASED_OUTPATIENT_CLINIC_OR_DEPARTMENT_OTHER): Payer: Self-pay

## 2022-11-07 IMAGING — CT CT HEART MORP W/ CTA COR W/ SCORE W/ CA W/CM &/OR W/O CM
3 of 6 series · 11 of 20 positions shown, 12 images · IV contrast (APPLIED)
Comparison: None.
COMPARISON: None.

Addendum:
EXAM:
OVER-READ INTERPRETATION  CT CHEST

The following report is an over-read performed by radiologist Dr.
Doretha Velazquez [[email or web address]] on 11/07/2020. This
over-read does not include interpretation of cardiac or coronary
anatomy or pathology. The coronary calcium score/coronary CTA
interpretation by the cardiologist is attached.
CLINICAL DATA: This is a 54 year old female with chest pain.
Cardiac/Coronary  CT
TECHNIQUE: The patient was scanned on a [employer] Force scanner.

[Series 6: best diast 95 % · axial · 0.40mm/px · z∈[+1299,+1354]mm · 3 of 279 slices shown]
[im 70/279  vessel]
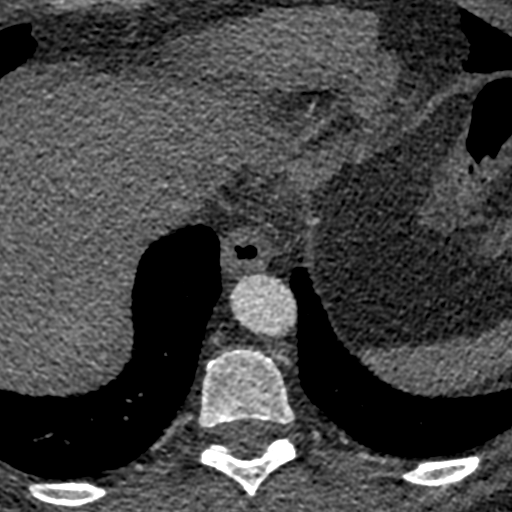
[im 140/279  vessel]
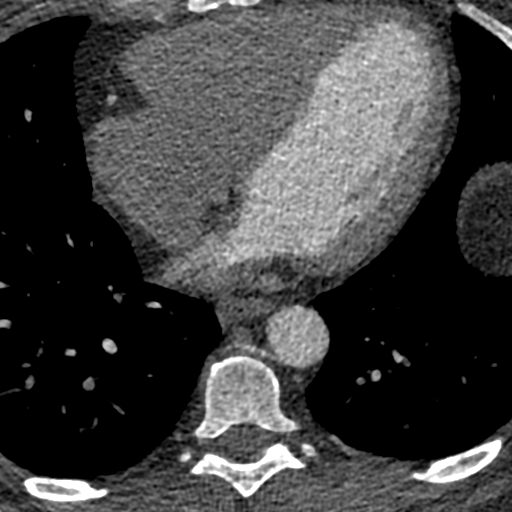
[im 209/279  vessel]
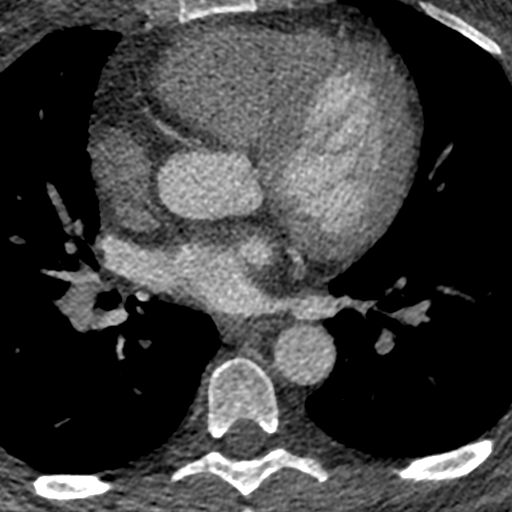

[Series 7: best syst · axial · 0.49mm/px · z∈[+1293,+1360]mm · 4 of 279 slices shown, 5 images (1 of 2)]
[im 56/279  vessel]
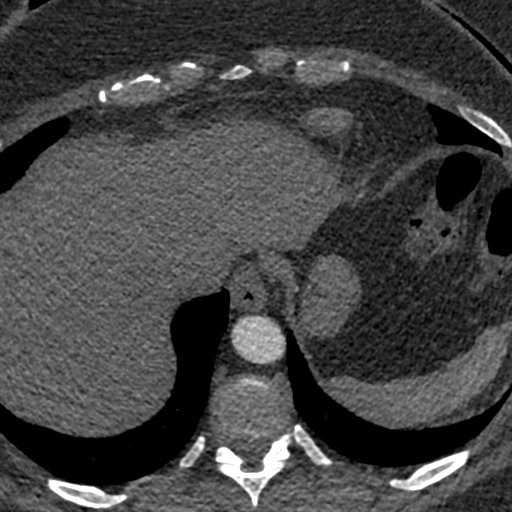
[im 56/279  lung]
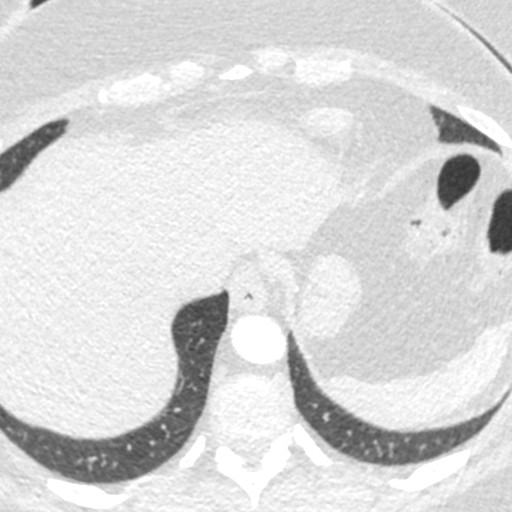
[im 112/279  vessel]
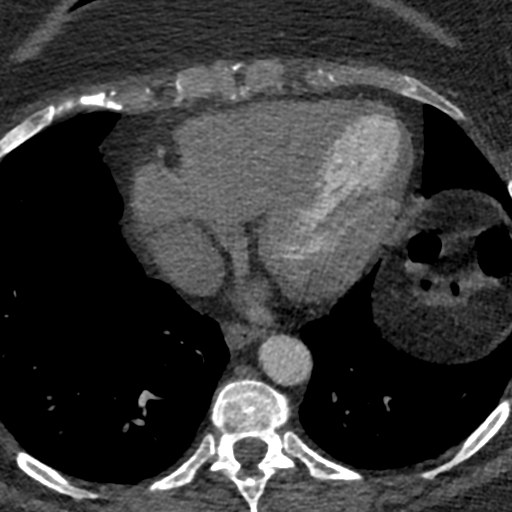
[im 167/279  vessel]
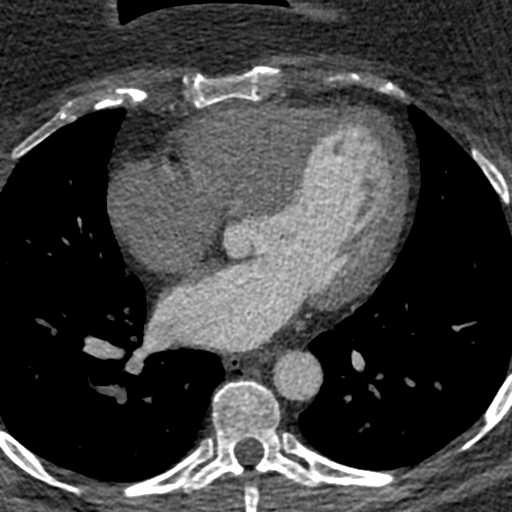
[im 223/279  vessel]
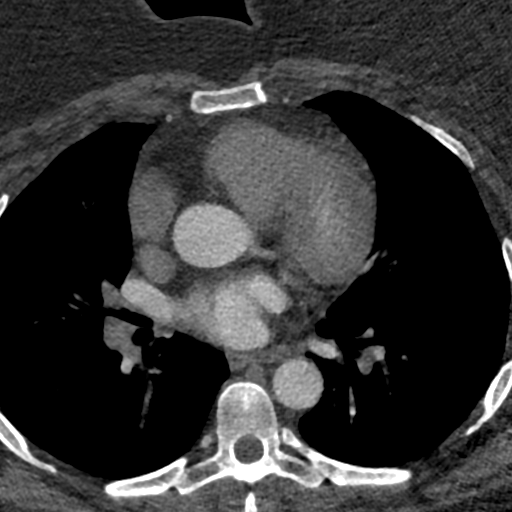

[Series 8: best syst · axial · 0.49mm/px · z∈[+1293,+1360]mm · 4 of 279 slices shown (2 of 2)]
[im 56/279  vessel]
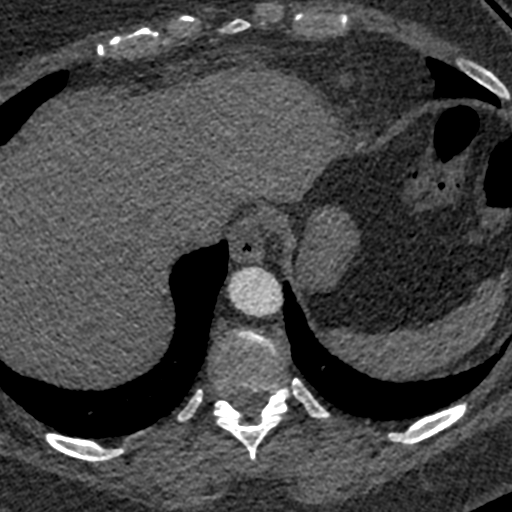
[im 112/279  vessel]
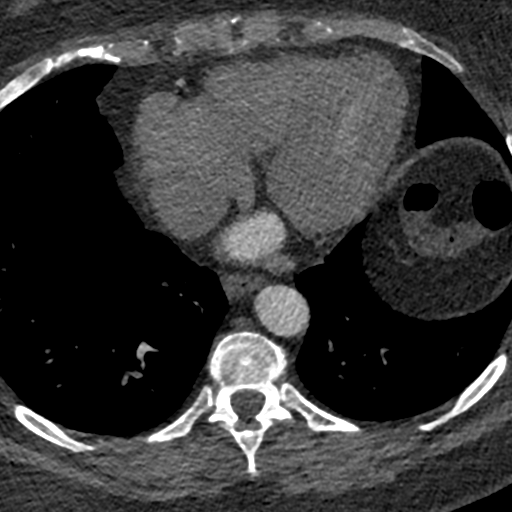
[im 167/279  vessel]
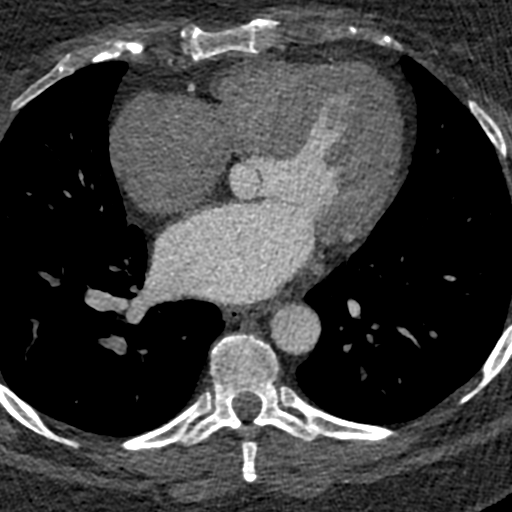
[im 223/279  vessel]
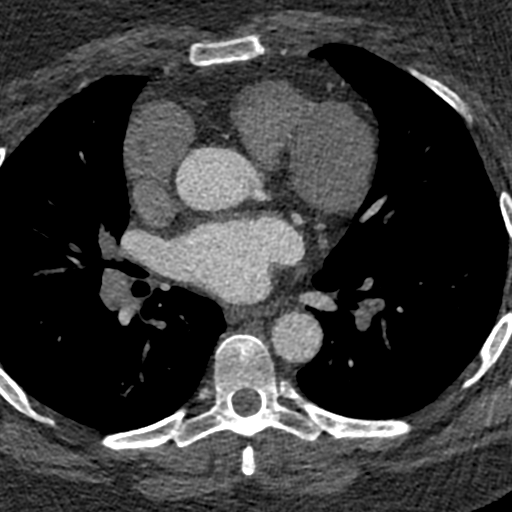

[11 of 20 positions shown; findings below may reference images not displayed]

FINDINGS: Within the visualized portions of the thorax there are no suspicious
appearing pulmonary nodules or masses, there is no acute
consolidative airspace disease, no pleural effusions, no
pneumothorax and no lymphadenopathy. Visualized portions of the
upper abdomen are unremarkable. There are no aggressive appearing
lytic or blastic lesions noted in the visualized portions of the
skeleton.
IMPRESSION: No significant incidental noncardiac findings are noted.
FINDINGS: A 120 kV prospective scan was triggered in the descending thoracic
aorta at 111 HU's. Axial non-contrast 3 mm slices were carried out
through the heart. The data set was analyzed on a dedicated work
station and scored using the Agatson method. Gantry rotation speed
was 250 msecs and collimation was .6 mm. No beta blockade and 0.8 mg
of sl NTG was given. The 3D data set was reconstructed in 5%
intervals of the 67-82 % of the R-R cycle. Diastolic phases were
analyzed on a dedicated work station using MPR, MIP and VRT modes.
The patient received 80 cc of contrast.

Aorta: Normal size.  No calcifications.  No dissection.

Aortic Valve:  Trileaflet.  No calcifications.

Coronary Arteries:  Normal coronary origin.  Right dominance.

RCA is a large dominant artery that gives rise to PDA and PLVB.
There is no plaque.

Left main is a large artery that gives rise to LAD and LCX arteries.

LAD is a large vessel that has no plaque.

LCX is a non-dominant artery that gives rise to one large OM1
branch. There is no plaque.

Other findings:

Normal pulmonary vein drainage into the left atrium.

Normal left atrial appendage without a thrombus.

Normal size of the pulmonary artery.
IMPRESSION: 1. Coronary calcium score of 0. This was 0 percentile for age and
sex matched control.

2. Normal coronary origin with right dominance.

3. No evidence of CAD.

Mariaelena Olney, DO

*** End of Addendum ***
EXAM:
OVER-READ INTERPRETATION  CT CHEST

The following report is an over-read performed by radiologist Dr.
Doretha Velazquez [[email or web address]] on 11/07/2020. This
over-read does not include interpretation of cardiac or coronary
anatomy or pathology. The coronary calcium score/coronary CTA
interpretation by the cardiologist is attached.
FINDINGS: Within the visualized portions of the thorax there are no suspicious
appearing pulmonary nodules or masses, there is no acute
consolidative airspace disease, no pleural effusions, no
pneumothorax and no lymphadenopathy. Visualized portions of the
upper abdomen are unremarkable. There are no aggressive appearing
lytic or blastic lesions noted in the visualized portions of the
skeleton.
IMPRESSION: No significant incidental noncardiac findings are noted.

## 2022-11-14 ENCOUNTER — Ambulatory Visit (INDEPENDENT_AMBULATORY_CARE_PROVIDER_SITE_OTHER): Payer: BC Managed Care – PPO | Admitting: Physician Assistant

## 2022-11-14 ENCOUNTER — Encounter (INDEPENDENT_AMBULATORY_CARE_PROVIDER_SITE_OTHER): Payer: Self-pay | Admitting: Physician Assistant

## 2022-11-14 ENCOUNTER — Other Ambulatory Visit (HOSPITAL_BASED_OUTPATIENT_CLINIC_OR_DEPARTMENT_OTHER): Payer: Self-pay

## 2022-11-14 VITALS — BP 105/66 | HR 79 | Temp 99.0°F | Ht 70.0 in | Wt 274.0 lb

## 2022-11-14 DIAGNOSIS — E1169 Type 2 diabetes mellitus with other specified complication: Secondary | ICD-10-CM | POA: Diagnosis not present

## 2022-11-14 DIAGNOSIS — E119 Type 2 diabetes mellitus without complications: Secondary | ICD-10-CM | POA: Insufficient documentation

## 2022-11-14 DIAGNOSIS — E559 Vitamin D deficiency, unspecified: Secondary | ICD-10-CM

## 2022-11-14 DIAGNOSIS — F3289 Other specified depressive episodes: Secondary | ICD-10-CM | POA: Diagnosis not present

## 2022-11-14 DIAGNOSIS — Z7985 Long-term (current) use of injectable non-insulin antidiabetic drugs: Secondary | ICD-10-CM

## 2022-11-14 DIAGNOSIS — Z6839 Body mass index (BMI) 39.0-39.9, adult: Secondary | ICD-10-CM

## 2022-11-14 MED ORDER — TIRZEPATIDE 10 MG/0.5ML ~~LOC~~ SOAJ
10.0000 mg | SUBCUTANEOUS | 0 refills | Status: DC
  Filled 2022-11-14: qty 2, 28d supply, fill #0

## 2022-11-14 MED ORDER — ONDANSETRON 4 MG PO TBDP
4.0000 mg | ORAL_TABLET | Freq: Three times a day (TID) | ORAL | 0 refills | Status: DC | PRN
  Filled 2022-11-14: qty 18, 21d supply, fill #0

## 2022-11-14 NOTE — Assessment & Plan Note (Signed)
Type 2 Diabetes Mellitus with other specified complication, without long-term current use of insulin HgbA1c is at goal. Last A1c was 5.5  Episodes of hypoglycemia: no Medication(s): [name] 7.5 mg SQ weekly  Lab Results  Component Value Date   HGBA1C 5.5 10/22/2022   HGBA1C 5.7 06/18/2022   HGBA1C 5.8 12/14/2021   Lab Results  Component Value Date   MICROALBUR 3.7 (H) 06/18/2022   LDLCALC 60 10/22/2022   CREATININE 0.70 10/22/2022   Lab Results  Component Value Date   GFR 98.04 10/22/2022   GFR 101.59 06/18/2022   GFR 86.61 12/14/2021    Plan: Continue and increase dose [name] 10 mg SQ weekly Continue working on nutrition plan to decrease simple carbohydrates, increase lean proteins and exercise to promote weight loss, and improve glycemic control.

## 2022-11-14 NOTE — Progress Notes (Signed)
Office: [phone]  /  Fax: [phone]  WEIGHT SUMMARY AND BIOMETRICS  Vitals Temp: 99 F (37.2 C) BP: 105/66 Pulse Rate: 79 SpO2: 100 %   Anthropometric Measurements Height: '5\' 10"'$  (1.778 m) Weight: 274 lb (124.3 kg) BMI (Calculated): 39.32 Weight at Last Visit: 277 lb Weight Lost Since Last Visit: 3 lb Starting Weight: 325 lb Total Weight Loss (lbs): 51 lb (23.1 kg)   Body Composition  Body Fat %: 46.5 % Fat Mass (lbs): 127.6 lbs Muscle Mass (lbs): 139.2 lbs Total Body Water (lbs): 105.6 lbs Visceral Fat Rating : 14   Other Clinical Data Fasting: no Labs: no Today's Visit #: 91 Starting Date: 05/23/17     HPI  Chief Complaint: OBESITY  Brandi Lester is here to discuss her progress with her obesity treatment plan. She is on the the Category 4 Plan and states she is following her eating plan approximately 50 % of the time. She states she is exercising/walking/indoor bike 30-45 minutes 3-4 times per week.   Interval History:  Since last office visit she has done well with weight loss. She is down 3 lbs.  She had a GI bug and this has resolved now and she is resuming her nutrition plan.  Hunger and appetite prior to GI bug were increased.  She has not been skipping meals.   She has spring break from school later this month.    Pharmacotherapy: [name] 7.5 mg weekly. No side effects.   PHYSICAL EXAM:  Blood pressure 105/66, pulse 79, temperature 99 F (37.2 C), height '5\' 10"'$  (1.778 m), weight 274 lb (124.3 kg), SpO2 100 %. Body mass index is 39.31 kg/m.  General: She is overweight, cooperative, alert, well developed, and in no acute distress. PSYCH: Has normal mood, affect and thought process.   Lungs: Normal breathing effort, no conversational dyspnea.  DIAGNOSTIC DATA REVIEWED:  BMET    Component Value Date/Time   NA 140 10/22/2022 1151   NA 139 10/20/2020 0933   K 3.9 10/22/2022 1151   CL 104 10/22/2022 1151   CO2 27 10/22/2022 1151    GLUCOSE 99 10/22/2022 1151   BUN 6 10/22/2022 1151   BUN 8 10/20/2020 0933   CREATININE 0.70 10/22/2022 1151   CREATININE 0.79 08/10/2022 1503   CALCIUM 9.6 10/22/2022 1151   GFRNONAA 103 10/20/2020 0933   GFRAA 119 10/20/2020 0933   Lab Results  Component Value Date   HGBA1C 5.5 10/22/2022   HGBA1C 6.0 10/14/2008   Lab Results  Component Value Date   INSULIN 20.7 12/01/2019   INSULIN 31.1 (H) 05/23/2017   Lab Results  Component Value Date   TSH 1.47 10/22/2022   CBC    Component Value Date/Time   WBC 5.5 10/22/2022 1151   RBC 4.32 10/22/2022 1151   HGB 13.1 10/22/2022 1151   HGB 12.3 07/07/2019 0900   HCT 38.3 10/22/2022 1151   HCT 37.3 07/07/2019 0900   PLT 342.0 10/22/2022 1151   PLT 304 07/07/2019 0900   MCV 88.9 10/22/2022 1151   MCV 84 07/07/2019 0900   MCH 28.3 11/28/2020 1358   MCHC 34.1 10/22/2022 1151   RDW 14.5 10/22/2022 1151   RDW 14.2 07/07/2019 0900   Iron Studies No results found for: "IRON", "TIBC", "FERRITIN", "IRONPCTSAT" Lipid Panel     Component Value Date/Time   CHOL 114 10/22/2022 1151   CHOL 129 07/07/2019 0900   TRIG 73.0 10/22/2022 1151   HDL 39.50 10/22/2022 1151   HDL 33 (L)  07/07/2019 0900   CHOLHDL 3 10/22/2022 1151   VLDL 14.6 10/22/2022 1151   LDLCALC 60 10/22/2022 1151   LDLCALC 64 11/28/2020 1358   Hepatic Function Panel     Component Value Date/Time   PROT 7.2 10/22/2022 1151   PROT 7.0 12/01/2019 0941   ALBUMIN 4.2 10/22/2022 1151   ALBUMIN 4.2 12/01/2019 0941   AST 11 10/22/2022 1151   ALT 15 10/22/2022 1151   ALKPHOS 88 10/22/2022 1151   BILITOT 0.4 10/22/2022 1151   BILITOT 0.3 12/01/2019 0941   BILIDIR 0.0 06/09/2014 0843   IBILI 0.1 (L) 02/03/2014 1648      Component Value Date/Time   TSH 1.47 10/22/2022 1151   Nutritional Lab Results  Component Value Date   VD25OH 77.64 10/22/2022   VD25OH 68.76 06/18/2022   VD25OH 90.29 12/14/2021    ASSOCIATED CONDITIONS ADDRESSED TODAY  ASSESSMENT AND  PLAN  Problem List Items Addressed This Visit     Vitamin D deficiency    Vitamin D Deficiency Vitamin D is at goal of 84.  Most recent vitamin D level was 77.64. She is on OTC vitamin D3 1000 IU daily. Lab Results  Component Value Date   VD25OH 77.64 10/22/2022   VD25OH 68.76 06/18/2022   VD25OH 90.29 12/14/2021   Plan: Continue vitamin D 1000 IU  4 tablets daily. Follow vitamin D level 2-3 times yearly to avoid over supplementation.        Depression    Other depression/emotional eating Brandi Lester has had issues with stress/emotional eating. Currently this is well controlled. Overall mood is stable. Medication(s): Bupropion SR 200 mg daily No side effects with medication.   Plan: Continue Bupropion SR 200 mg daily Continue emotional eating strategies.           Morbid obesity ([place])   Relevant Medications   tirzepatide ([name]) 10 MG/0.5ML Pen   Diabetes mellitus ([place]) - Primary    Type 2 Diabetes Mellitus with other specified complication, without long-term current use of insulin HgbA1c is at goal. Last A1c was 5.5  Episodes of hypoglycemia: no Medication(s): [name] 7.5 mg SQ weekly  Lab Results  Component Value Date   HGBA1C 5.5 10/22/2022   HGBA1C 5.7 06/18/2022   HGBA1C 5.8 12/14/2021   Lab Results  Component Value Date   MICROALBUR 3.7 (H) 06/18/2022   LDLCALC 60 10/22/2022   CREATININE 0.70 10/22/2022   Lab Results  Component Value Date   GFR 98.04 10/22/2022   GFR 101.59 06/18/2022   GFR 86.61 12/14/2021   Plan: Continue and increase dose [name] 10 mg SQ weekly Continue working on nutrition plan to decrease simple carbohydrates, increase lean proteins and exercise to promote weight loss, and improve glycemic control.        Relevant Medications   ondansetron (ZOFRAN-ODT) 4 MG disintegrating tablet   tirzepatide ([name]) 10 MG/0.5ML Pen   Other Visit Diagnoses     BMI 39.0-39.9,adult, Current BMI 39.8             TREATMENT  PLAN FOR OBESITY:  Recommended Dietary Goals  Brandi Lester is currently in the action stage of change. As such, her goal is to continue weight management plan. She has agreed to the Category 4 Plan.  Behavioral Intervention  We discussed the following Behavioral Modification Strategies today: increasing lean protein intake, decreasing simple carbohydrates , increasing vegetables, increasing water intake, and work on meal planning and easy cooking plans.  Additional resources provided today: NA  Recommended Physical Activity Goals  [place] has been advised to work up to 150 minutes of moderate intensity aerobic activity a week and strengthening exercises 2-3 times per week for cardiovascular health, weight loss maintenance and preservation of muscle mass.   She has agreed to increase physical activity in their day and reduce sedentary time (increase NEAT).    Pharmacotherapy We discussed various medication options to help Rakia with her weight loss efforts and we both agreed to continue [facility] for Type 2 diabetes.    Return in about 4 weeks (around 12/12/2022).[name] She was informed of the importance of frequent follow up visits to maximize her success with intensive lifestyle modifications for her multiple health conditions.   ATTESTASTION STATEMENTS:  Reviewed by clinician on day of visit: allergies, medications, problem list, medical history, surgical history, family history, social history, and previous encounter notes.   I have personally spent 30 minutes total time today in preparation, patient care, nutritional counseling and documentation for this visit, including the following: review of clinical lab tests; review of medical tests/procedures/services.      Nary Sneed, PA-C

## 2022-11-14 NOTE — Assessment & Plan Note (Signed)
Vitamin D Deficiency Vitamin D is at goal of 50.  Most recent vitamin D level was 77.64. She is on OTC vitamin D3 1000 IU daily. Lab Results  Component Value Date   VD25OH 77.64 10/22/2022   VD25OH 68.76 06/18/2022   VD25OH 90.29 12/14/2021    Plan: Continue vitamin D 1000 IU  4 tablets daily. Follow vitamin D level 2-3 times yearly to avoid over supplementation.

## 2022-11-14 NOTE — Assessment & Plan Note (Signed)
Other depression/emotional eating Brandi Lester has had issues with stress/emotional eating. Currently this is well controlled. Overall mood is stable. Medication(s): Bupropion SR 200 mg daily No side effects with medication.   Plan: Continue Bupropion SR 200 mg daily Continue emotional eating strategies.

## 2022-11-15 ENCOUNTER — Other Ambulatory Visit: Payer: Self-pay

## 2022-11-21 ENCOUNTER — Other Ambulatory Visit (HOSPITAL_BASED_OUTPATIENT_CLINIC_OR_DEPARTMENT_OTHER): Payer: Self-pay

## 2022-11-26 ENCOUNTER — Institutional Professional Consult (permissible substitution) (HOSPITAL_BASED_OUTPATIENT_CLINIC_OR_DEPARTMENT_OTHER): Payer: BC Managed Care – PPO | Admitting: Pulmonary Disease

## 2022-11-27 LAB — HM DIABETES EYE EXAM

## 2022-12-12 ENCOUNTER — Encounter (INDEPENDENT_AMBULATORY_CARE_PROVIDER_SITE_OTHER): Payer: Self-pay | Admitting: Physician Assistant

## 2022-12-12 ENCOUNTER — Other Ambulatory Visit (HOSPITAL_BASED_OUTPATIENT_CLINIC_OR_DEPARTMENT_OTHER): Payer: Self-pay

## 2022-12-12 ENCOUNTER — Ambulatory Visit (INDEPENDENT_AMBULATORY_CARE_PROVIDER_SITE_OTHER): Payer: BC Managed Care – PPO | Admitting: Physician Assistant

## 2022-12-12 VITALS — BP 119/68 | HR 85 | Temp 98.4°F | Ht 70.0 in | Wt 277.0 lb

## 2022-12-12 DIAGNOSIS — I152 Hypertension secondary to endocrine disorders: Secondary | ICD-10-CM | POA: Diagnosis not present

## 2022-12-12 DIAGNOSIS — Z7985 Long-term (current) use of injectable non-insulin antidiabetic drugs: Secondary | ICD-10-CM

## 2022-12-12 DIAGNOSIS — G479 Sleep disorder, unspecified: Secondary | ICD-10-CM | POA: Diagnosis not present

## 2022-12-12 DIAGNOSIS — E1159 Type 2 diabetes mellitus with other circulatory complications: Secondary | ICD-10-CM

## 2022-12-12 DIAGNOSIS — E1169 Type 2 diabetes mellitus with other specified complication: Secondary | ICD-10-CM

## 2022-12-12 DIAGNOSIS — Z6839 Body mass index (BMI) 39.0-39.9, adult: Secondary | ICD-10-CM | POA: Insufficient documentation

## 2022-12-12 MED ORDER — TIRZEPATIDE 12.5 MG/0.5ML ~~LOC~~ SOAJ
12.5000 mg | SUBCUTANEOUS | 0 refills | Status: DC
  Filled 2022-12-12: qty 2, 28d supply, fill #0

## 2022-12-12 NOTE — Assessment & Plan Note (Signed)
Reports history of poor sleep, lack of deep sleep and notes some fatigue/daytime sleepiness.  We discussed potential consequences of untreated sleep apnea including weight gain, poor BP control and chronic fatigue.  Plan: Was set up for sleep study, but did not proceed and plans to reschedule.

## 2022-12-12 NOTE — Assessment & Plan Note (Signed)
Hypertension Hypertension well controlled, improved, asymptomatic, and no significant medication side effects noted.  Medication(s): amlodipine 10 mg daily Losartan 100 mg daily Carvedilol 25 mg daily   HCTZ 25 mg daily   Lasix as needed for swelling in legs  BP Readings from Last 3 Encounters:  12/12/22 119/68  11/14/22 105/66  10/24/22 119/75   Lab Results  Component Value Date   CREATININE 0.70 10/22/2022   CREATININE 0.79 08/10/2022   CREATININE 0.79 08/03/2022   Lab Results  Component Value Date   GFR 98.04 10/22/2022   GFR 101.59 06/18/2022   GFR 86.61 12/14/2021    Plan: BP has been much improved recently . Will try to decrease amlodipine to 5 mg daily and have her monitor at home and follow here. Continue all other antihypertensives at current dosages. Continue to work on nutrition plan to promote weight loss and improve BP control.

## 2022-12-12 NOTE — Assessment & Plan Note (Signed)
Type 2 Diabetes Mellitus with other specified complication, without long-term current use of insulin HgbA1c is at goal. Last A1c was 5.5  Medication(s): [name] 10 mg SQ weekly No side effects with [name].  She is working on nutrition plan to decrease simple carbohydrates, increase lean proteins and exercise to promote weight loss, improve glycemic control.   Lab Results  Component Value Date   HGBA1C 5.5 10/22/2022   HGBA1C 5.7 06/18/2022   HGBA1C 5.8 12/14/2021   Lab Results  Component Value Date   MICROALBUR 3.7 (H) 06/18/2022   LDLCALC 60 10/22/2022   CREATININE 0.70 10/22/2022   Lab Results  Component Value Date   GFR 98.04 10/22/2022   GFR 101.59 06/18/2022   GFR 86.61 12/14/2021    Plan: Continue and increase dose [name] 12.5 mg SQ weekly Continue working on nutrition plan to decrease simple carbohydrates, increase lean proteins and exercise to promote weight loss, and improve glycemic control .

## 2022-12-12 NOTE — Progress Notes (Signed)
Office: [phone]  /  Fax: [phone]  WEIGHT SUMMARY AND BIOMETRICS  Vitals Temp: 98.4 F (36.9 C) BP: 119/68 Pulse Rate: 85 SpO2: 93 %   Anthropometric Measurements Height: 5\' 10"  (1.778 m) Weight: 277 lb (125.6 kg) BMI (Calculated): 39.75 Weight at Last Visit: 274 lb Weight Lost Since Last Visit: 0 lb Weight Gained Since Last Visit: 3 lb Starting Weight: 325 lb Total Weight Loss (lbs): 48 lb (21.8 kg)   Body Composition  Body Fat %: 49.7 % Fat Mass (lbs): 137.8 lbs Muscle Mass (lbs): 132.6 lbs Visceral Fat Rating : 15   Other Clinical Data Fasting: no Labs: no Today's Visit #: 92 Starting Date: 05/23/17     HPI  Chief Complaint: OBESITY  Brandi Lester is here to discuss her progress with her obesity treatment plan. She is on the the Category 4 Plan and states she is following her eating plan approximately 80 % of the time. She states she is exercising/walking 40-45 minutes 3-4 times per week.   Interval History:  Since last office visit she is up 3 lbs. Hunger/appetite partially controlled Sleep- reports was supposed to go for sleep study, but had to cancel the appointment and we discussed the importance of getting evaluated and treating sleep apnea if she needs it.  Exercise- walking several times weekly . She is working a second job grading and notes some decrease in movement as sitting in evenings right now.    Pharmacotherapy: [name] 10 mg weekly. No N/V/constipation/diarrhea, no mass of neck or difficulty swallowing. No mood changes. Mood is stable.   PHYSICAL EXAM:  Blood pressure 119/68, pulse 85, temperature 98.4 F (36.9 C), height 5\' 10"  (1.778 m), weight 277 lb (125.6 kg), SpO2 93 %. Body mass index is 39.75 kg/m.  General: She is overweight, cooperative, alert, well developed, and in no acute distress. PSYCH: Has normal mood, affect and thought process.   Cardiovascular: regular rhythm, HR 85, BP 119/68 Lungs: Normal breathing  effort, no conversational dyspnea. Neuro: no focal deficits  DIAGNOSTIC DATA REVIEWED:  BMET    Component Value Date/Time   NA 140 10/22/2022 1151   NA 139 10/20/2020 0933   K 3.9 10/22/2022 1151   CL 104 10/22/2022 1151   CO2 27 10/22/2022 1151   GLUCOSE 99 10/22/2022 1151   BUN 6 10/22/2022 1151   BUN 8 10/20/2020 0933   CREATININE 0.70 10/22/2022 1151   CREATININE 0.79 08/10/2022 1503   CALCIUM 9.6 10/22/2022 1151   GFRNONAA 103 10/20/2020 0933   GFRAA 119 10/20/2020 0933   Lab Results  Component Value Date   HGBA1C 5.5 10/22/2022   HGBA1C 6.0 10/14/2008   Lab Results  Component Value Date   INSULIN 20.7 12/01/2019   INSULIN 31.1 (H) 05/23/2017   Lab Results  Component Value Date   TSH 1.47 10/22/2022   CBC    Component Value Date/Time   WBC 5.5 10/22/2022 1151   RBC 4.32 10/22/2022 1151   HGB 13.1 10/22/2022 1151   HGB 12.3 07/07/2019 0900   HCT 38.3 10/22/2022 1151   HCT 37.3 07/07/2019 0900   PLT 342.0 10/22/2022 1151   PLT 304 07/07/2019 0900   MCV 88.9 10/22/2022 1151   MCV 84 07/07/2019 0900   MCH 28.3 11/28/2020 1358   MCHC 34.1 10/22/2022 1151   RDW 14.5 10/22/2022 1151   RDW 14.2 07/07/2019 0900   Iron Studies No results found for: "IRON", "TIBC", "FERRITIN", "IRONPCTSAT" Lipid Panel     Component Value  Date/Time   CHOL 114 10/22/2022 1151   CHOL 129 07/07/2019 0900   TRIG 73.0 10/22/2022 1151   HDL 39.50 10/22/2022 1151   HDL 33 (L) 07/07/2019 0900   CHOLHDL 3 10/22/2022 1151   VLDL 14.6 10/22/2022 1151   LDLCALC 60 10/22/2022 1151   LDLCALC 64 11/28/2020 1358   Hepatic Function Panel     Component Value Date/Time   PROT 7.2 10/22/2022 1151   PROT 7.0 12/01/2019 0941   ALBUMIN 4.2 10/22/2022 1151   ALBUMIN 4.2 12/01/2019 0941   AST 11 10/22/2022 1151   ALT 15 10/22/2022 1151   ALKPHOS 88 10/22/2022 1151   BILITOT 0.4 10/22/2022 1151   BILITOT 0.3 12/01/2019 0941   BILIDIR 0.0 06/09/2014 0843   IBILI 0.1 (L) 02/03/2014 1648       Component Value Date/Time   TSH 1.47 10/22/2022 1151   Nutritional Lab Results  Component Value Date   VD25OH 77.64 10/22/2022   VD25OH 68.76 06/18/2022   VD25OH 90.29 12/14/2021    ASSOCIATED CONDITIONS ADDRESSED TODAY  ASSESSMENT AND PLAN  Problem List Items Addressed This Visit     Hypertension associated with type 2 diabetes mellitus    Hypertension Hypertension well controlled, improved, asymptomatic, and no significant medication side effects noted.  Medication(s): amlodipine 10 mg daily Losartan 100 mg daily Carvedilol 25 mg daily   HCTZ 25 mg daily   Lasix as needed for swelling in legs  BP Readings from Last 3 Encounters:  12/12/22 119/68  11/14/22 105/66  10/24/22 119/75   Lab Results  Component Value Date   CREATININE 0.70 10/22/2022   CREATININE 0.79 08/10/2022   CREATININE 0.79 08/03/2022   Lab Results  Component Value Date   GFR 98.04 10/22/2022   GFR 101.59 06/18/2022   GFR 86.61 12/14/2021   Plan: BP has been much improved recently . Will try to decrease amlodipine to 5 mg daily and have her monitor at home and follow here. Continue all other antihypertensives at current dosages. Continue to work on nutrition plan to promote weight loss and improve BP control.         Relevant Medications   tirzepatide ([name]) 12.5 MG/0.5ML Pen   Morbid obesity   Relevant Medications   tirzepatide ([name]) 12.5 MG/0.5ML Pen   Diabetes mellitus - Primary    Type 2 Diabetes Mellitus with other specified complication, without long-term current use of insulin HgbA1c is at goal. Last A1c was 5.5  Medication(s): [name] 10 mg SQ weekly No side effects with [name].  She is working on nutrition plan to decrease simple carbohydrates, increase lean proteins and exercise to promote weight loss, improve glycemic control.   Lab Results  Component Value Date   HGBA1C 5.5 10/22/2022   HGBA1C 5.7 06/18/2022   HGBA1C 5.8 12/14/2021   Lab Results   Component Value Date   MICROALBUR 3.7 (H) 06/18/2022   LDLCALC 60 10/22/2022   CREATININE 0.70 10/22/2022   Lab Results  Component Value Date   GFR 98.04 10/22/2022   GFR 101.59 06/18/2022   GFR 86.61 12/14/2021   Plan: Continue and increase dose [name] 12.5 mg SQ weekly Continue working on nutrition plan to decrease simple carbohydrates, increase lean proteins and exercise to promote weight loss, and improve glycemic control .        Relevant Medications   tirzepatide ([name]) 12.5 MG/0.5ML Pen   Disordered sleep    Reports history of poor sleep, lack of deep sleep and notes some fatigue/daytime sleepiness.  We discussed potential consequences of untreated sleep apnea including weight gain, poor BP control and chronic fatigue.  Plan: Was set up for sleep study, but did not proceed and plans to reschedule.       BMI 39.0-39.9,adult Current BMI 39.8   Current BMI 39.8 Down total of 48 lbs TBW loss of 14.8%  TREATMENT PLAN FOR OBESITY:  Recommended Dietary Goals  Casey is currently in the action stage of change. As such, her goal is to continue weight management plan. She has agreed to the Category 4 Plan.  Behavioral Intervention  We discussed the following Behavioral Modification Strategies today: increasing lean protein intake, decreasing simple carbohydrates , increasing vegetables, increasing fiber rich foods, avoiding skipping meals, increasing water intake, and planning for success.  Additional resources provided today: NA  Recommended Physical Activity Goals  Nathan has been advised to work up to 150 minutes of moderate intensity aerobic activity a week and strengthening exercises 2-3 times per week for cardiovascular health, weight loss maintenance and preservation of muscle mass.   She has agreed to Continue current level of physical activity    Pharmacotherapy We discussed various medication options to help Anihya with her weight loss efforts  and we both agreed to increase [name] to 12.5 mg weekly for Type 2 diabetes and to continue to work on nutritional and behavioral strategies to promote weight loss.    Return in about 4 weeks (around 01/09/2023).[name]. She was informed of the importance of frequent follow up visits to maximize her success with intensive lifestyle modifications for her multiple health conditions.   ATTESTASTION STATEMENTS:  Reviewed by clinician on day of visit: allergies, medications, problem list, medical history, surgical history, family history, social history, and previous encounter notes.   I have personally spent 35 minutes total time today in preparation, patient care, nutritional counseling and documentation for this visit, including the following: review of clinical lab tests; review of medical tests/procedures/services.      Allyah Heather, PA-C

## 2022-12-13 ENCOUNTER — Other Ambulatory Visit (HOSPITAL_BASED_OUTPATIENT_CLINIC_OR_DEPARTMENT_OTHER): Payer: Self-pay

## 2023-01-09 ENCOUNTER — Other Ambulatory Visit (HOSPITAL_BASED_OUTPATIENT_CLINIC_OR_DEPARTMENT_OTHER): Payer: Self-pay

## 2023-01-09 ENCOUNTER — Ambulatory Visit (INDEPENDENT_AMBULATORY_CARE_PROVIDER_SITE_OTHER): Payer: BC Managed Care – PPO | Admitting: Physician Assistant

## 2023-01-09 ENCOUNTER — Other Ambulatory Visit: Payer: Self-pay | Admitting: Family Medicine

## 2023-01-09 ENCOUNTER — Encounter (INDEPENDENT_AMBULATORY_CARE_PROVIDER_SITE_OTHER): Payer: Self-pay | Admitting: Physician Assistant

## 2023-01-09 VITALS — BP 132/83 | HR 83 | Temp 98.8°F | Ht 70.0 in | Wt 282.0 lb

## 2023-01-09 DIAGNOSIS — I10 Essential (primary) hypertension: Secondary | ICD-10-CM

## 2023-01-09 DIAGNOSIS — I152 Hypertension secondary to endocrine disorders: Secondary | ICD-10-CM

## 2023-01-09 DIAGNOSIS — Z7984 Long term (current) use of oral hypoglycemic drugs: Secondary | ICD-10-CM

## 2023-01-09 DIAGNOSIS — T7840XA Allergy, unspecified, initial encounter: Secondary | ICD-10-CM

## 2023-01-09 DIAGNOSIS — E1159 Type 2 diabetes mellitus with other circulatory complications: Secondary | ICD-10-CM | POA: Diagnosis not present

## 2023-01-09 DIAGNOSIS — Z7985 Long-term (current) use of injectable non-insulin antidiabetic drugs: Secondary | ICD-10-CM

## 2023-01-09 DIAGNOSIS — Z6841 Body Mass Index (BMI) 40.0 and over, adult: Secondary | ICD-10-CM

## 2023-01-09 DIAGNOSIS — E1169 Type 2 diabetes mellitus with other specified complication: Secondary | ICD-10-CM | POA: Diagnosis not present

## 2023-01-09 DIAGNOSIS — F3289 Other specified depressive episodes: Secondary | ICD-10-CM | POA: Diagnosis not present

## 2023-01-09 DIAGNOSIS — E876 Hypokalemia: Secondary | ICD-10-CM

## 2023-01-09 MED ORDER — AMLODIPINE BESYLATE 5 MG PO TABS
5.0000 mg | ORAL_TABLET | Freq: Every day | ORAL | 0 refills | Status: DC
  Filled 2023-01-09: qty 90, 90d supply, fill #0

## 2023-01-09 MED ORDER — TIRZEPATIDE 15 MG/0.5ML ~~LOC~~ SOAJ
15.0000 mg | SUBCUTANEOUS | 0 refills | Status: DC
  Filled 2023-01-09: qty 2, 28d supply, fill #0

## 2023-01-09 MED ORDER — BUPROPION HCL ER (SR) 200 MG PO TB12
200.0000 mg | ORAL_TABLET | Freq: Every morning | ORAL | 0 refills | Status: DC
  Filled 2023-01-09: qty 90, 90d supply, fill #0

## 2023-01-09 NOTE — Progress Notes (Signed)
Office: [phone]  /  Fax: [phone]  WEIGHT SUMMARY AND BIOMETRICS  Vitals Temp: 98.8 F (37.1 C) BP: 132/83 Pulse Rate: 83 SpO2: 99 %   Anthropometric Measurements Height: 5\' 10"  (1.778 m) Weight: 282 lb (127.9 kg) BMI (Calculated): 40.46 Weight at Last Visit: 277 lb Weight Lost Since Last Visit: 0 lb Weight Gained Since Last Visit: 5 lb Starting Weight: 325 lb   Body Composition  Body Fat %: 43 % Fat Mass (lbs): 121.6 lbs Muscle Mass (lbs): 153.2 lbs Total Body Water (lbs): 118.2 lbs Visceral Fat Rating : 13   Other Clinical Data Fasting: no Labs: no Today's Visit #: 93 Starting Date: 05/23/17     HPI  Chief Complaint: OBESITY  Brandi Lester is here to discuss her progress with her obesity treatment plan. She is on the the Category 4 Plan and states she is following her eating plan approximately 70-80 % of the time. She states she is exercising 30-45 minutes 2 times per week.   Interval History:  Since last office visit she up 5 lbs. But Bio impedence shows up significantly in muscle mass, but also up significantly in water weight.  She is feeling much better and did well with extensive walking with [place]. She has been working on increasing her exercise. She is working a 2nd job currently, but school will end soon as does the second job, so she will have more time to focus on her plan.    Pharmacotherapy: [name] 12.5 mg weekly. Denies mass in neck, dysphagia, dyspepsia, persistent hoarseness, abdominal pain, or N/V/Constipation or diarrhea. Has annual eye exam. Mood is stable.    PHYSICAL EXAM:  Blood pressure 132/83, pulse 83, temperature 98.8 F (37.1 C), height 5\' 10"  (1.778 m), weight 282 lb (127.9 kg), SpO2 99 %. Body mass index is 40.46 kg/m.  General: She is overweight, cooperative, alert, well developed, and in no acute distress. PSYCH: Has normal mood, affect and thought process.   Cardiovascular: HR 80's regular.  BP 132/83 today. Trace ankle/pedal edema Lungs: Normal breathing effort, no conversational dyspnea. Neuro: no focal deficits  DIAGNOSTIC DATA REVIEWED:  BMET    Component Value Date/Time   NA 140 10/22/2022 1151   NA 139 10/20/2020 0933   K 3.9 10/22/2022 1151   CL 104 10/22/2022 1151   CO2 27 10/22/2022 1151   GLUCOSE 99 10/22/2022 1151   BUN 6 10/22/2022 1151   BUN 8 10/20/2020 0933   CREATININE 0.70 10/22/2022 1151   CREATININE 0.79 08/10/2022 1503   CALCIUM 9.6 10/22/2022 1151   GFRNONAA 103 10/20/2020 0933   GFRAA 119 10/20/2020 0933   Lab Results  Component Value Date   HGBA1C 5.5 10/22/2022   HGBA1C 6.0 10/14/2008   Lab Results  Component Value Date   INSULIN 20.7 12/01/2019   INSULIN 31.1 (H) 05/23/2017   Lab Results  Component Value Date   TSH 1.47 10/22/2022   CBC    Component Value Date/Time   WBC 5.5 10/22/2022 1151   RBC 4.32 10/22/2022 1151   HGB 13.1 10/22/2022 1151   HGB 12.3 07/07/2019 0900   HCT 38.3 10/22/2022 1151   HCT 37.3 07/07/2019 0900   PLT 342.0 10/22/2022 1151   PLT 304 07/07/2019 0900   MCV 88.9 10/22/2022 1151   MCV 84 07/07/2019 0900   MCH 28.3 11/28/2020 1358   MCHC 34.1 10/22/2022 1151   RDW 14.5 10/22/2022 1151   RDW 14.2 07/07/2019 0900  Iron Studies No results found for: "IRON", "TIBC", "FERRITIN", "IRONPCTSAT" Lipid Panel     Component Value Date/Time   CHOL 114 10/22/2022 1151   CHOL 129 07/07/2019 0900   TRIG 73.0 10/22/2022 1151   HDL 39.50 10/22/2022 1151   HDL 33 (L) 07/07/2019 0900   CHOLHDL 3 10/22/2022 1151   VLDL 14.6 10/22/2022 1151   LDLCALC 60 10/22/2022 1151   LDLCALC 64 11/28/2020 1358   Hepatic Function Panel     Component Value Date/Time   PROT 7.2 10/22/2022 1151   PROT 7.0 12/01/2019 0941   ALBUMIN 4.2 10/22/2022 1151   ALBUMIN 4.2 12/01/2019 0941   AST 11 10/22/2022 1151   ALT 15 10/22/2022 1151   ALKPHOS 88 10/22/2022 1151   BILITOT 0.4 10/22/2022 1151   BILITOT 0.3 12/01/2019  0941   BILIDIR 0.0 06/09/2014 0843   IBILI 0.1 (L) 02/03/2014 1648      Component Value Date/Time   TSH 1.47 10/22/2022 1151   Nutritional Lab Results  Component Value Date   VD25OH 77.64 10/22/2022   VD25OH 68.76 06/18/2022   VD25OH 90.29 12/14/2021    ASSOCIATED CONDITIONS ADDRESSED TODAY  ASSESSMENT AND PLAN  Problem List Items Addressed This Visit     Depression   Relevant Medications   buPROPion (WELLBUTRIN SR) 200 MG 12 hr tablet   Hypertension associated with type 2 diabetes mellitus (HCC)   Relevant Medications   tirzepatide ([name]) 15 MG/0.5ML Pen   amLODipine (NORVASC) 5 MG tablet   Morbid obesity (HCC)   Relevant Medications   tirzepatide ([name]) 15 MG/0.5ML Pen   Diabetes mellitus (HCC) - Primary   Relevant Medications   tirzepatide ([name]) 15 MG/0.5ML Pen   Other Visit Diagnoses     BMI 40.0-44.9, adult (HCC) BMI 40.6       Relevant Medications   tirzepatide ([name]) 15 MG/0.5ML Pen     Type 2 Diabetes Mellitus with other specified complication, without long-term current use of insulin HgbA1c is at goal. Last A1c was 5.5  Medication(s): [name] 12.5 mg SQ weekly and Metformin 500 mg twice daily with meals  Lab Results  Component Value Date   HGBA1C 5.5 10/22/2022   HGBA1C 5.7 06/18/2022   HGBA1C 5.8 12/14/2021   Lab Results  Component Value Date   MICROALBUR 3.7 (H) 06/18/2022   LDLCALC 60 10/22/2022   CREATININE 0.70 10/22/2022   Lab Results  Component Value Date   GFR 98.04 10/22/2022   GFR 101.59 06/18/2022   GFR 86.61 12/14/2021    Plan: Continue and increase dose [name] 15 mg SQ weekly  Hypertension Hypertension well controlled, asymptomatic, no significant medication side effects noted, needs further observation, and total water weight is up over the past few weeks. Discussed watching sodium intake and she is going to take her PRN lasix 20 mg on Wed and Sat for the next few weeks . She continues on HCTZ at 12.5 mg  daily and is on KCL supplementation due to previously low potassium levels .  Medication(s): amlodipine 5 mg daily   HCTZ 12.5 mg daily   Carvedilol 25 mg twice daily   Losartan 100 mg daily  BP Readings from Last 3 Encounters:  01/09/23 132/83  12/12/22 119/68  11/14/22 105/66   Lab Results  Component Value Date   CREATININE 0.70 10/22/2022   CREATININE 0.79 08/10/2022   CREATININE 0.79 08/03/2022   Lab Results  Component Value Date   GFR 98.04 10/22/2022   GFR 101.59 06/18/2022  GFR 86.61 12/14/2021   Plan: Will continue to monitor BP closely. Plan to DC amlodipine if BP is consistently lower or any signs of hypotension. Continue and refill amlodipine 5 mg daily and continue all other antihypertensives at current doses.  Overall hypertension is well controlled, asymptomatic, no significant medication side effects noted, but total water weight is up over the past few weeks 10 + lbs and HCTZ was decreased due to hypokalemia. Discussed watching sodium intake and she is going to take her PRN lasix 20 mg on Wed and Sat for the next few weeks . She continues on HCTZ at 12.5 mg daily and is on KCL supplementation due to previously low potassium levels. Will monitor closely as have seen significant improvement in BP management with patients on GLP/GIP medications resulting in decreased need for antihypertensives.    Other depression/emotional eating Samaiyah has had issues with stress/emotional eating. Currently this is well controlled. Overall mood is stable. Medication(s): Bupropion SR 200 mg daily in am  Plan: Continue and refill Bupropion SR 200 mg daily in am Continue to work on emotional eating strategies.    Obesity:  Current BMI 40.6 Down 43 lbs TBW loss of 13.23% Increasing [name] to 15 mg weekly.   TREATMENT PLAN FOR OBESITY:  Recommended Dietary Goals  Tikita is currently in the action stage of change. As such, her goal is to continue weight management  plan. She has agreed to the Category 4 Plan.  Behavioral Intervention  We discussed the following Behavioral Modification Strategies today: increasing lean protein intake, decreasing simple carbohydrates , increasing vegetables, increasing lower glycemic fruits, increasing fiber rich foods, increasing water intake, emotional eating strategies and understanding the difference between hunger signals and cravings, continue to practice mindfulness when eating, and planning for success.  Additional resources provided today: NA  Recommended Physical Activity Goals  Tarri has been advised to work up to 150 minutes of moderate intensity aerobic activity a week and strengthening exercises 2-3 times per week for cardiovascular health, weight loss maintenance and preservation of muscle mass.   She has agreed to Continue current level of physical activity  and Increase the intensity, frequency or duration of aerobic exercises     Pharmacotherapy We discussed various medication options to help Jory with her weight loss efforts and we both agreed to increase [name] to 15 mg weekly and continue metformin for Type 2 diabetes and continue to work on nutritional and behavioral strategies to promote weight loss.      Return in about 4 weeks (around 02/06/2023).[name] She was informed of the importance of frequent follow up visits to maximize her success with intensive lifestyle modifications for her multiple health conditions.   ATTESTASTION STATEMENTS:  Reviewed by clinician on day of visit: allergies, medications, problem list, medical history, surgical history, family history, social history, and previous encounter notes.   I have personally spent 42 minutes total time today in preparation, patient care, nutritional counseling and documentation for this visit, including the following: review of clinical lab tests; review of medical tests/procedures/services.       [name] Stamos, PA-C

## 2023-01-10 ENCOUNTER — Other Ambulatory Visit: Payer: Self-pay

## 2023-01-10 ENCOUNTER — Other Ambulatory Visit (HOSPITAL_BASED_OUTPATIENT_CLINIC_OR_DEPARTMENT_OTHER): Payer: Self-pay

## 2023-01-10 MED ORDER — POTASSIUM CHLORIDE CRYS ER 20 MEQ PO TBCR
20.0000 meq | EXTENDED_RELEASE_TABLET | Freq: Three times a day (TID) | ORAL | 1 refills | Status: DC
  Filled 2023-01-10: qty 270, 90d supply, fill #0
  Filled 2023-04-07: qty 270, 90d supply, fill #1

## 2023-01-11 ENCOUNTER — Ambulatory Visit: Payer: BC Managed Care – PPO | Admitting: Family Medicine

## 2023-01-21 ENCOUNTER — Ambulatory Visit: Payer: BC Managed Care – PPO | Admitting: Family Medicine

## 2023-01-21 ENCOUNTER — Other Ambulatory Visit (HOSPITAL_BASED_OUTPATIENT_CLINIC_OR_DEPARTMENT_OTHER): Payer: Self-pay

## 2023-01-21 ENCOUNTER — Encounter: Payer: Self-pay | Admitting: Family Medicine

## 2023-01-21 VITALS — BP 130/80 | HR 91 | Temp 98.6°F | Resp 18 | Ht 70.0 in | Wt 279.4 lb

## 2023-01-21 DIAGNOSIS — R051 Acute cough: Secondary | ICD-10-CM

## 2023-01-21 DIAGNOSIS — J0140 Acute pansinusitis, unspecified: Secondary | ICD-10-CM | POA: Diagnosis not present

## 2023-01-21 MED ORDER — BENZONATATE 200 MG PO CAPS
200.0000 mg | ORAL_CAPSULE | Freq: Two times a day (BID) | ORAL | 0 refills | Status: DC | PRN
  Filled 2023-01-21: qty 20, 10d supply, fill #0

## 2023-01-21 MED ORDER — DOXYCYCLINE HYCLATE 100 MG PO TABS
100.0000 mg | ORAL_TABLET | Freq: Two times a day (BID) | ORAL | 0 refills | Status: DC
  Filled 2023-01-21: qty 20, 10d supply, fill #0

## 2023-01-21 NOTE — Assessment & Plan Note (Signed)
Doxycycline 100 mg bid x 10 days  Tessalon perles  Con't astelin and nasacort  F/u as needed

## 2023-01-21 NOTE — Progress Notes (Signed)
Subjective:   By signing my name below, I, [name], attest that this documentation has been prepared under the direction and in the presence of Seabron Spates R, DO. 01/21/2023.   Patient ID: Brandi Lester, female    DOB: 1966/04/12, 57 y.o.   MRN: [id number]  Chief Complaint  Patient presents with   Cough    Green Mucus (smells bad), Headaches, Congestion, Sneezing, Coughing, Drainage. No Covid Test, x3 weeks.     HPI Patient is in today for an office visit.  malodorous green mucus  She believes that she has a sinus infection lasting for about two and a half weeks. Her symptoms have included congestion, sneezing, coughing, soreness of the roof of her mouth and throat, sinus pressure (L>R) and dental soreness. Today, she became more concerned as she noted greenish nasal mucus that was malodorous, which is a new symptom. She denies any wheezing or shortness of breath.   For treatment she has been using multiple OTC medications including Mucinex, Flonase, Nasacort, Xyzal, and sometimes benadryl. She is avoiding decongestants given her history of hypertension. She requests a prescription for tessalon perles as they have been helpful in the past.   Past Medical History:  Diagnosis Date   ADD (attention deficit disorder)    Allergy    allergic rhinitis   Anemia 07/25/2013   Angio-edema    Anxiety    Arthritis    not dx'd   Back pain    Costochondritis 02/20/2015   Diabetes mellitus    Type II   previously 3 years ago - no longer on meds    Dyslipidemia 07/25/2013   Fungus infection 06/2012   Left great toe   GERD (gastroesophageal reflux disease)    Hypertension    IBS (irritable bowel syndrome)    Ingrown nail 06/2012   right foot next to the last toe   Insomnia 04/20/2013   Joint pain    Lactose intolerance    Leg edema    Obesity    Pedal edema 02/20/2015   Prediabetes    Preventative health care 09/25/2015   PVC (premature ventricular contraction)     Recurrent upper respiratory infection (URI)    Urticaria     Past Surgical History:  Procedure Laterality Date   HYSTEROSCOPY N/A 10/04/2015   Procedure: HYSTEROSCOPY with Removal IUD;  Surgeon: Waynard Reeds, MD;  Location: [facility];  Service: Gynecology;  Laterality: N/A;   lasik     eye surgery   WISDOM TOOTH EXTRACTION      Family History  Problem Relation Age of Onset   Allergic rhinitis Mother    Heart attack Mother 71   Hypertension Mother    Heart disease Mother    Obesity Mother    Cancer Father        prostate   Prostate cancer Father    Allergic rhinitis Sister    Heart attack Sister 74   Allergic rhinitis Sister    Allergic rhinitis Sister    Allergic rhinitis Maternal Grandmother    Hypertension Other    Colon cancer Neg Hx    Colon polyps Neg Hx    Esophageal cancer Neg Hx    Stomach cancer Neg Hx    Rectal cancer Neg Hx     Social History   Socioeconomic History   Marital status: Single    Spouse name: Not on file   Number of children: Not on file   Years of education: Not on  file   Highest education level: Not on file  Occupational History   Occupation: Runner, broadcasting/film/video, Public librarian  Tobacco Use   Smoking status: Former    Packs/day: 1.00    Years: 15.00    Additional pack years: 0.00    Total pack years: 15.00    Types: Cigarettes    Quit date: 09/03/2001    Years since quitting: 21.3    Passive exposure: Never   Smokeless tobacco: Never  Vaping Use   Vaping Use: Never used  Substance and Sexual Activity   Alcohol use: Yes    Comment: rare   Drug use: No   Sexual activity: Yes    Birth control/protection: None  Other Topics Concern   Not on file  Social History Narrative   Not on file   Social Determinants of Health   Financial Resource Strain: Not on file  Food Insecurity: Not on file  Transportation Needs: Not on file  Physical Activity: Not on file  Stress: Not on file  Social Connections: Not on file  Intimate Partner Violence:  Not on file    Outpatient Medications Prior to Visit  Medication Sig Dispense Refill   acetaminophen (TYLENOL 8 HOUR) 650 MG CR tablet Take 1 tablet (650 mg total) every 8 (eight) hours as needed by mouth for pain.     albuterol (VENTOLIN HFA) 108 (90 Base) MCG/ACT inhaler Inhale 2 puffs into the lungs every 6 (six) hours as needed for wheezing or shortness of breath. 6.7 g 1   amLODipine (NORVASC) 5 MG tablet Take 1 tablet (5 mg total) by mouth daily. 90 tablet 0   atorvastatin (LIPITOR) 10 MG tablet Take 1 tablet (10 mg total) by mouth daily. 90 tablet 3   azelastine (ASTELIN) 0.1 % nasal spray Place 2 sprays into both nostrils 2 (two) times daily. 30 mL 5   buPROPion (WELLBUTRIN SR) 200 MG 12 hr tablet Take 1 tablet (200 mg total) by mouth in the morning. 90 tablet 0   Carbinoxamine Maleate 4 MG TABS Take 1 tablet (4 mg total) by mouth 4 (four) times daily as needed. 28 tablet 0   carvedilol (COREG) 25 MG tablet Take 1 tablet (25 mg total) by mouth 2 (two) times daily. 180 tablet 3   cholecalciferol (VITAMIN D3) 25 MCG (1000 UT) tablet Take 4,000 Units by mouth daily.     EPINEPHrine (EPIPEN 2-PAK) 0.3 mg/0.3 mL IJ SOAJ injection Use as directed for severe allergic reaction 2 each 1   Fluticasone Propionate (XHANCE) 93 MCG/ACT EXHU spray 2 sprays in each nostril twice a day for nasal congestion 16 mL 5   furosemide (LASIX) 20 MG tablet Take 1 tablet (20 mg total) by mouth daily as needed for edema (weight gain of >3 lbs/24 hours). 30 tablet 3   hydrochlorothiazide (HYDRODIURIL) 25 MG tablet Take 1 tablet (25 mg total) by mouth daily. (Patient taking differently: Take 25 mg by mouth daily. Taking half a pill daily which is 12.5.) 90 tablet 3   ibuprofen (ADVIL,MOTRIN) 600 MG tablet Take 1 tablet (600 mg total) by mouth every 6 (six) hours as needed. 90 tablet 0   losartan (COZAAR) 100 MG tablet Take 1 tablet (100 mg total) by mouth daily. 90 tablet 3   medroxyPROGESTERone (PROVERA) 10 MG tablet  TAKE 1 TABLET (10 MG TOTAL) BY MOUTH DAILY. 90 tablet 3   metFORMIN (GLUCOPHAGE) 500 MG tablet Take 1 tablet (500 mg total) by mouth 2 (two) times daily with a  meal. 180 tablet 0   ondansetron (ZOFRAN-ODT) 4 MG disintegrating tablet Take 1 tablet (4 mg total) by mouth every 8 (eight) hours as needed for nausea or vomiting. 20 tablet 0   pantoprazole (PROTONIX) 40 MG tablet Take 1 tablet (40 mg total) by mouth daily. 90 tablet 1   potassium chloride SA (KLOR-CON M) 20 MEQ tablet Take 1 tablet (20 mEq total) by mouth 3 (three) times daily. 270 tablet 1   tirzepatide ([name]) 15 MG/0.5ML Pen Inject 15 mg into the skin once a week. 6 mL 0   No facility-administered medications prior to visit.    Allergies  Allergen Reactions   Penicillins Itching and Swelling    Has patient had a PCN reaction causing immediate rash, facial/tongue/throat swelling, SOB or lightheadedness with hypotension: yes Has patient had a PCN reaction causing severe rash involving mucus membranes or skin necrosis: no Has patient had a PCN reaction that required hospitalization no Has patient had a PCN reaction occurring within the last 10 years: yes If all of the above answers are "NO", then may proceed with Cephalosporin use.    Victoza [Liraglutide]     Vomiting    Review of Systems  Constitutional:  Negative for fever and malaise/fatigue.  HENT:  Positive for congestion, sinus pain and sore throat.        + Sneezing + Greenish, malodorous mucus  Eyes:  Negative for blurred vision.  Respiratory:  Positive for cough. Negative for sputum production, shortness of breath and wheezing.   Cardiovascular:  Negative for chest pain, palpitations and leg swelling.  Gastrointestinal:  Negative for abdominal pain, blood in stool and nausea.  Genitourinary:  Negative for dysuria and frequency.  Musculoskeletal:  Negative for falls.  Skin:  Negative for rash.  Neurological:  Negative for dizziness and loss of consciousness.   Endo/Heme/Allergies:  Negative for environmental allergies.  Psychiatric/Behavioral:  Negative for depression. The patient is not nervous/anxious.        Objective:    Physical Exam Vitals and nursing note reviewed.  Constitutional:      Appearance: Normal appearance.  HENT:     Head: Normocephalic and atraumatic.     Right Ear: Tympanic membrane, ear canal and external ear normal.     Left Ear: Tympanic membrane, ear canal and external ear normal.     Nose:     Right Sinus: Maxillary sinus tenderness and frontal sinus tenderness present.     Left Sinus: Maxillary sinus tenderness and frontal sinus tenderness present.     Mouth/Throat:     Mouth: Mucous membranes are moist.     Pharynx: No oropharyngeal exudate or posterior oropharyngeal erythema.     Comments: Post-nasal drainage is noted. Eyes:     Extraocular Movements: Extraocular movements intact.     Pupils: Pupils are equal, round, and reactive to light.  Cardiovascular:     Rate and Rhythm: Normal rate and regular rhythm.     Heart sounds: Normal heart sounds. No murmur heard.    No gallop.  Pulmonary:     Effort: Pulmonary effort is normal. No respiratory distress.     Breath sounds: Normal breath sounds. No wheezing or rales.  Lymphadenopathy:     Cervical: Cervical adenopathy present.  Skin:    General: Skin is warm and dry.  Neurological:     General: No focal deficit present.     Mental Status: She is alert and oriented to person, place, and time.  Psychiatric:  Mood and Affect: Mood normal.        Behavior: Behavior normal.     BP 130/80 (BP Location: Left Arm, Patient Position: Sitting, Cuff Size: Large)   Pulse 91   Temp 98.6 F (37 C) (Oral)   Resp 18   Ht 5\' 10"  (1.778 m)   Wt 279 lb 6.4 oz (126.7 kg)   SpO2 97%   BMI 40.09 kg/m  Wt Readings from Last 3 Encounters:  01/21/23 279 lb 6.4 oz (126.7 kg)  01/09/23 282 lb (127.9 kg)  12/12/22 277 lb (125.6 kg)    Diabetic Foot Exam -  Simple   No data filed    Lab Results  Component Value Date   WBC 5.5 10/22/2022   HGB 13.1 10/22/2022   HCT 38.3 10/22/2022   PLT 342.0 10/22/2022   GLUCOSE 99 10/22/2022   CHOL 114 10/22/2022   TRIG 73.0 10/22/2022   HDL 39.50 10/22/2022   LDLCALC 60 10/22/2022   ALT 15 10/22/2022   AST 11 10/22/2022   NA 140 10/22/2022   K 3.9 10/22/2022   CL 104 10/22/2022   CREATININE 0.70 10/22/2022   BUN 6 10/22/2022   CO2 27 10/22/2022   TSH 1.47 10/22/2022   HGBA1C 5.5 10/22/2022   MICROALBUR 3.7 (H) 06/18/2022    Lab Results  Component Value Date   TSH 1.47 10/22/2022   Lab Results  Component Value Date   WBC 5.5 10/22/2022   HGB 13.1 10/22/2022   HCT 38.3 10/22/2022   MCV 88.9 10/22/2022   PLT 342.0 10/22/2022   Lab Results  Component Value Date   NA 140 10/22/2022   K 3.9 10/22/2022   CO2 27 10/22/2022   GLUCOSE 99 10/22/2022   BUN 6 10/22/2022   CREATININE 0.70 10/22/2022   BILITOT 0.4 10/22/2022   ALKPHOS 88 10/22/2022   AST 11 10/22/2022   ALT 15 10/22/2022   PROT 7.2 10/22/2022   ALBUMIN 4.2 10/22/2022   CALCIUM 9.6 10/22/2022   GFR 98.04 10/22/2022   Lab Results  Component Value Date   CHOL 114 10/22/2022   Lab Results  Component Value Date   HDL 39.50 10/22/2022   Lab Results  Component Value Date   LDLCALC 60 10/22/2022   Lab Results  Component Value Date   TRIG 73.0 10/22/2022   Lab Results  Component Value Date   CHOLHDL 3 10/22/2022   Lab Results  Component Value Date   HGBA1C 5.5 10/22/2022       Assessment & Plan:   Problem List Items Addressed This Visit       Unprioritized   Acute non-recurrent pansinusitis - Primary    Doxycycline 100 mg bid x 10 days  Tessalon perles  Con't astelin and nasacort  F/u as needed       Relevant Medications   doxycycline (VIBRA-TABS) 100 MG tablet   benzonatate (TESSALON) 200 MG capsule   Other Visit Diagnoses     Acute cough       Relevant Medications   benzonatate  (TESSALON) 200 MG capsule        Meds ordered this encounter  Medications   doxycycline (VIBRA-TABS) 100 MG tablet    Sig: Take 1 tablet (100 mg total) by mouth 2 (two) times daily.    Dispense:  20 tablet    Refill:  0   benzonatate (TESSALON) 200 MG capsule    Sig: Take 1 capsule (200 mg total) by mouth 2 (two) times daily  as needed for cough.    Dispense:  20 capsule    Refill:  0    I, Donato Schultz, DO, personally preformed the services described in this documentation.  All medical record entries made by the scribe were at my direction and in my presence.  I have reviewed the chart and discharge instructions (if applicable) and agree that the record reflects my personal performance and is accurate and complete. 01/21/2023.  I,Mathew Stumpf,acting as a Neurosurgeon for [employer], DO.,have documented all relevant documentation on the behalf of Donato Schultz, DO,as directed by  Donato Schultz, DO while in the presence of Donato Schultz, DO.   Donato Schultz, DO

## 2023-02-07 ENCOUNTER — Encounter (INDEPENDENT_AMBULATORY_CARE_PROVIDER_SITE_OTHER): Payer: Self-pay | Admitting: Physician Assistant

## 2023-02-07 ENCOUNTER — Other Ambulatory Visit (HOSPITAL_BASED_OUTPATIENT_CLINIC_OR_DEPARTMENT_OTHER): Payer: Self-pay

## 2023-02-07 ENCOUNTER — Ambulatory Visit (INDEPENDENT_AMBULATORY_CARE_PROVIDER_SITE_OTHER): Payer: BC Managed Care – PPO | Admitting: Physician Assistant

## 2023-02-07 ENCOUNTER — Other Ambulatory Visit: Payer: Self-pay

## 2023-02-07 VITALS — BP 131/80 | HR 85 | Temp 98.4°F | Ht 70.0 in | Wt 276.0 lb

## 2023-02-07 DIAGNOSIS — I152 Hypertension secondary to endocrine disorders: Secondary | ICD-10-CM | POA: Diagnosis not present

## 2023-02-07 DIAGNOSIS — E1169 Type 2 diabetes mellitus with other specified complication: Secondary | ICD-10-CM | POA: Diagnosis not present

## 2023-02-07 DIAGNOSIS — F3289 Other specified depressive episodes: Secondary | ICD-10-CM | POA: Diagnosis not present

## 2023-02-07 DIAGNOSIS — Z7984 Long term (current) use of oral hypoglycemic drugs: Secondary | ICD-10-CM

## 2023-02-07 DIAGNOSIS — Z6839 Body mass index (BMI) 39.0-39.9, adult: Secondary | ICD-10-CM

## 2023-02-07 DIAGNOSIS — E1159 Type 2 diabetes mellitus with other circulatory complications: Secondary | ICD-10-CM | POA: Diagnosis not present

## 2023-02-07 DIAGNOSIS — Z7985 Long-term (current) use of injectable non-insulin antidiabetic drugs: Secondary | ICD-10-CM

## 2023-02-07 MED ORDER — METFORMIN HCL 500 MG PO TABS
500.0000 mg | ORAL_TABLET | Freq: Two times a day (BID) | ORAL | 0 refills | Status: DC
Start: 2023-02-07 — End: 2023-05-08
  Filled 2023-02-07: qty 180, 90d supply, fill #0

## 2023-02-07 MED ORDER — TIRZEPATIDE 15 MG/0.5ML ~~LOC~~ SOAJ
15.0000 mg | SUBCUTANEOUS | 0 refills | Status: DC
  Filled 2023-02-07: qty 6, 84d supply, fill #0
  Filled 2023-02-08: qty 2, 28d supply, fill #0

## 2023-02-07 NOTE — Progress Notes (Unsigned)
.smr  Office: [phone]  /  Fax: [phone]  WEIGHT SUMMARY AND BIOMETRICS  Vitals Temp: 98.4 F (36.9 C) BP: 131/80 Pulse Rate: 85 SpO2: 100 %   Anthropometric Measurements Height: 5\' 10"  (1.778 m) Weight: 276 lb (125.2 kg) BMI (Calculated): 39.6 Weight at Last Visit: 282 lb Weight Lost Since Last Visit: 6 lb Starting Weight: 325 lb   Body Composition  Body Fat %: 41 % Fat Mass (lbs): 113.2 lbs Muscle Mass (lbs): 154.8 lbs Total Body Water (lbs): 111.8 lbs Visceral Fat Rating : 12   Other Clinical Data Fasting: No Labs: No Today's Visit #: 83 Starting Date: 05/23/17     HPI  Chief Complaint: OBESITY  Brandi Lester is here to discuss her progress with her obesity treatment plan. She is on the the Category 4 Plan and states she is following her eating plan approximately 70 % of the time. She states she is exercising /biking/walking 30-45 minutes 3-4 times per week.   Interval History:  Since last office visit she is down 6 lbs. She reports she thought her [name] pen did not injecti properly and she gave herself another  injection. She then had significant nausea for several days. This has resolved and she is doing well with intake again. She is having BM's and is otherwise feeling at baseline.  Hunger/appetite-well controlled and not skipping meals currently, but very nauseated with accidental second injection.  Cravings- not excessive Stress- She is going to have the summer off this year and is planning to focus on nutrition and increasing exercise. We discussed going to [facility] to work with a Psychologist, educational.  Sleep- restorative for the most part Exercise-regularly 3-4 times weekly Hydration-adequate   Pharmacotherapy: [name] 15 mg weekly. Denies mass in neck, dysphagia, dyspepsia, persistent hoarseness, abdominal pain, or N/V/Constipation or diarrhea. Has annual eye exam. Mood is stable.    TREATMENT PLAN FOR OBESITY:  Recommended Dietary Goals  Brandi Lester  is currently in the action stage of change. As such, her goal is to continue weight management plan. She has agreed to the Category 4 Plan.  Behavioral Intervention  We discussed the following Behavioral Modification Strategies today: increasing lean protein intake, decreasing simple carbohydrates , increasing vegetables, increasing lower glycemic fruits, increasing fiber rich foods, avoiding skipping meals, increasing water intake, continue to practice mindfulness when eating, and planning for success.  Additional resources provided today: NA  Recommended Physical Activity Goals  Brandi Lester has been advised to work up to 150 minutes of moderate intensity aerobic activity a week and strengthening exercises 2-3 times per week for cardiovascular health, weight loss maintenance and preservation of muscle mass.   She has agreed to Increase the intensity, frequency or duration of strengthening exercises  and Increase the intensity, frequency or duration of aerobic exercises     Pharmacotherapy We discussed various medication options to help Brandi Lester with her weight loss efforts and we both agreed to continue [name] 15 mg weekly.    Return in about 4 weeks (around 03/07/2023).[name] She was informed of the importance of frequent follow up visits to maximize her success with intensive lifestyle modifications for her multiple health conditions.  PHYSICAL EXAM:  Blood pressure 131/80, pulse 85, temperature 98.4 F (36.9 C), height 5\' 10"  (1.778 m), weight 276 lb (125.2 kg), SpO2 100 %. Body mass index is 39.6 kg/m.  General: She is overweight, cooperative, alert, well developed, and in no acute distress. PSYCH: Has normal mood, affect and thought process.   Cardiovascular: HR 80's regular Lungs:  Normal breathing effort, no conversational dyspnea. Neuro: no focal deficit  DIAGNOSTIC DATA REVIEWED:  BMET    Component Value Date/Time   NA 140 10/22/2022 1151   NA 139 10/20/2020 0933   K 3.9  10/22/2022 1151   CL 104 10/22/2022 1151   CO2 27 10/22/2022 1151   GLUCOSE 99 10/22/2022 1151   BUN 6 10/22/2022 1151   BUN 8 10/20/2020 0933   CREATININE 0.70 10/22/2022 1151   CREATININE 0.79 08/10/2022 1503   CALCIUM 9.6 10/22/2022 1151   GFRNONAA 103 10/20/2020 0933   GFRAA 119 10/20/2020 0933   Lab Results  Component Value Date   HGBA1C 5.5 10/22/2022   HGBA1C 6.0 10/14/2008   Lab Results  Component Value Date   INSULIN 20.7 12/01/2019   INSULIN 31.1 (H) 05/23/2017   Lab Results  Component Value Date   TSH 1.47 10/22/2022   CBC    Component Value Date/Time   WBC 5.5 10/22/2022 1151   RBC 4.32 10/22/2022 1151   HGB 13.1 10/22/2022 1151   HGB 12.3 07/07/2019 0900   HCT 38.3 10/22/2022 1151   HCT 37.3 07/07/2019 0900   PLT 342.0 10/22/2022 1151   PLT 304 07/07/2019 0900   MCV 88.9 10/22/2022 1151   MCV 84 07/07/2019 0900   MCH 28.3 11/28/2020 1358   MCHC 34.1 10/22/2022 1151   RDW 14.5 10/22/2022 1151   RDW 14.2 07/07/2019 0900   Iron Studies No results found for: "IRON", "TIBC", "FERRITIN", "IRONPCTSAT" Lipid Panel     Component Value Date/Time   CHOL 114 10/22/2022 1151   CHOL 129 07/07/2019 0900   TRIG 73.0 10/22/2022 1151   HDL 39.50 10/22/2022 1151   HDL 33 (L) 07/07/2019 0900   CHOLHDL 3 10/22/2022 1151   VLDL 14.6 10/22/2022 1151   LDLCALC 60 10/22/2022 1151   LDLCALC 64 11/28/2020 1358   Hepatic Function Panel     Component Value Date/Time   PROT 7.2 10/22/2022 1151   PROT 7.0 12/01/2019 0941   ALBUMIN 4.2 10/22/2022 1151   ALBUMIN 4.2 12/01/2019 0941   AST 11 10/22/2022 1151   ALT 15 10/22/2022 1151   ALKPHOS 88 10/22/2022 1151   BILITOT 0.4 10/22/2022 1151   BILITOT 0.3 12/01/2019 0941   BILIDIR 0.0 06/09/2014 0843   IBILI 0.1 (L) 02/03/2014 1648      Component Value Date/Time   TSH 1.47 10/22/2022 1151   Nutritional Lab Results  Component Value Date   VD25OH 77.64 10/22/2022   VD25OH 68.76 06/18/2022   VD25OH 90.29  12/14/2021    ASSOCIATED CONDITIONS ADDRESSED TODAY  ASSESSMENT AND PLAN  Problem List Items Addressed This Visit     Depression   Hypertension associated with type 2 diabetes mellitus (HCC)   Relevant Medications   tirzepatide ([name]) 15 MG/0.5ML Pen   metFORMIN (GLUCOPHAGE) 500 MG tablet   Morbid obesity (HCC)   Relevant Medications   tirzepatide ([name]) 15 MG/0.5ML Pen   metFORMIN (GLUCOPHAGE) 500 MG tablet   Diabetes mellitus (HCC) - Primary   Relevant Medications   tirzepatide ([name]) 15 MG/0.5ML Pen   metFORMIN (GLUCOPHAGE) 500 MG tablet   Type 2 Diabetes Mellitus {dwwdmcomplications:29152}, {dwwinsulin:29153} HgbA1c {ACTION; IS/IS NOT:21021397} at goal. Last A1c was *** CBGs: {dwwcbg:29160} Episodes of hypoglycemia: {yes***/no:17258} Medication(s): {dwwpharmacotherapy:29109}  Lab Results  Component Value Date   HGBA1C 5.5 10/22/2022   HGBA1C 5.7 06/18/2022   HGBA1C 5.8 12/14/2021   Lab Results  Component Value Date   MICROALBUR 3.7 (H) 06/18/2022  LDLCALC 60 10/22/2022   CREATININE 0.70 10/22/2022   Lab Results  Component Value Date   GFR 98.04 10/22/2022   GFR 101.59 06/18/2022   GFR 86.61 12/14/2021    Plan: {dwwmed:29123} {dwwpharmacotherapy:29109} Hypertension Hypertension {disease control degree:315147}.  Medication(s): ***  BP Readings from Last 3 Encounters:  02/07/23 131/80  01/21/23 130/80  01/09/23 132/83   Lab Results  Component Value Date   CREATININE 0.70 10/22/2022   CREATININE 0.79 08/10/2022   CREATININE 0.79 08/03/2022   Lab Results  Component Value Date   GFR 98.04 10/22/2022   GFR 101.59 06/18/2022   GFR 86.61 12/14/2021    Plan: Continue all antihypertensives at current dosages. {dwwee:29148}/emotional eating Brandi Lester has had issues with stress/emotional eating. Currently this is {EWCONTROLASSESSMENT:24261}. Overall mood {ACTION; IS/IS NOT:21021397} stable. Medication(s):  {dwweemeds:29150}  Plan: {dwwmed:29123} {dwweemeds:29150}  ATTESTASTION STATEMENTS:  Reviewed by clinician on day of visit: allergies, medications, problem list, medical history, surgical history, family history, social history, and previous encounter notes.   I have personally spent 30 minutes total time today in preparation, patient care, nutritional counseling and documentation for this visit, including the following: review of clinical lab tests; review of medical tests/procedures/services.      Brandi Salata, PA-C

## 2023-02-08 ENCOUNTER — Other Ambulatory Visit (HOSPITAL_BASED_OUTPATIENT_CLINIC_OR_DEPARTMENT_OTHER): Payer: Self-pay

## 2023-02-09 ENCOUNTER — Other Ambulatory Visit (HOSPITAL_BASED_OUTPATIENT_CLINIC_OR_DEPARTMENT_OTHER): Payer: Self-pay

## 2023-02-14 ENCOUNTER — Other Ambulatory Visit (HOSPITAL_BASED_OUTPATIENT_CLINIC_OR_DEPARTMENT_OTHER): Payer: Self-pay

## 2023-02-18 ENCOUNTER — Other Ambulatory Visit (INDEPENDENT_AMBULATORY_CARE_PROVIDER_SITE_OTHER): Payer: Self-pay | Admitting: Physician Assistant

## 2023-02-18 ENCOUNTER — Encounter (INDEPENDENT_AMBULATORY_CARE_PROVIDER_SITE_OTHER): Payer: Self-pay | Admitting: Physician Assistant

## 2023-02-18 ENCOUNTER — Other Ambulatory Visit (HOSPITAL_BASED_OUTPATIENT_CLINIC_OR_DEPARTMENT_OTHER): Payer: Self-pay

## 2023-02-18 DIAGNOSIS — E1169 Type 2 diabetes mellitus with other specified complication: Secondary | ICD-10-CM

## 2023-02-18 MED ORDER — TIRZEPATIDE 12.5 MG/0.5ML ~~LOC~~ SOAJ
12.5000 mg | SUBCUTANEOUS | 0 refills | Status: DC
Start: 2023-02-18 — End: 2023-03-11
  Filled 2023-02-18: qty 2, 28d supply, fill #0

## 2023-02-19 ENCOUNTER — Other Ambulatory Visit (HOSPITAL_BASED_OUTPATIENT_CLINIC_OR_DEPARTMENT_OTHER): Payer: Self-pay

## 2023-02-19 ENCOUNTER — Ambulatory Visit (INDEPENDENT_AMBULATORY_CARE_PROVIDER_SITE_OTHER): Payer: BC Managed Care – PPO | Admitting: Family Medicine

## 2023-02-19 ENCOUNTER — Other Ambulatory Visit: Payer: Self-pay

## 2023-02-19 ENCOUNTER — Other Ambulatory Visit: Payer: Self-pay | Admitting: Family Medicine

## 2023-02-19 VITALS — BP 128/78 | HR 81 | Temp 98.0°F | Resp 16 | Ht 70.0 in | Wt 283.6 lb

## 2023-02-19 DIAGNOSIS — E1169 Type 2 diabetes mellitus with other specified complication: Secondary | ICD-10-CM

## 2023-02-19 DIAGNOSIS — E559 Vitamin D deficiency, unspecified: Secondary | ICD-10-CM

## 2023-02-19 DIAGNOSIS — R7989 Other specified abnormal findings of blood chemistry: Secondary | ICD-10-CM

## 2023-02-19 DIAGNOSIS — I152 Hypertension secondary to endocrine disorders: Secondary | ICD-10-CM | POA: Diagnosis not present

## 2023-02-19 DIAGNOSIS — E785 Hyperlipidemia, unspecified: Secondary | ICD-10-CM

## 2023-02-19 DIAGNOSIS — Z7985 Long-term (current) use of injectable non-insulin antidiabetic drugs: Secondary | ICD-10-CM

## 2023-02-19 DIAGNOSIS — Z0000 Encounter for general adult medical examination without abnormal findings: Secondary | ICD-10-CM | POA: Diagnosis not present

## 2023-02-19 DIAGNOSIS — Z7984 Long term (current) use of oral hypoglycemic drugs: Secondary | ICD-10-CM

## 2023-02-19 DIAGNOSIS — E1159 Type 2 diabetes mellitus with other circulatory complications: Secondary | ICD-10-CM

## 2023-02-19 MED ORDER — PANTOPRAZOLE SODIUM 40 MG PO TBEC
40.0000 mg | DELAYED_RELEASE_TABLET | Freq: Every day | ORAL | 0 refills | Status: DC
  Filled 2023-02-19: qty 90, 90d supply, fill #0

## 2023-02-19 MED ORDER — HYDROCHLOROTHIAZIDE 12.5 MG PO CAPS
12.5000 mg | ORAL_CAPSULE | Freq: Every day | ORAL | 1 refills | Status: DC
  Filled 2023-02-19: qty 90, 90d supply, fill #0
  Filled 2023-05-14: qty 90, 90d supply, fill #1

## 2023-02-19 NOTE — Patient Instructions (Signed)
Hypertension, Adult High blood pressure (hypertension) is when the force of blood pumping through the arteries is too strong. The arteries are the blood vessels that carry blood from the heart throughout the body. Hypertension forces the heart to work harder to pump blood and may cause arteries to become narrow or stiff. Untreated or uncontrolled hypertension can lead to a heart attack, heart failure, a stroke, kidney disease, and other problems. A blood pressure reading consists of a higher number over a lower number. Ideally, your blood pressure should be below 120/80. The first ("top") number is called the systolic pressure. It is a measure of the pressure in your arteries as your heart beats. The second ("bottom") number is called the diastolic pressure. It is a measure of the pressure in your arteries as the heart relaxes. What are the causes? The exact cause of this condition is not known. There are some conditions that result in high blood pressure. What increases the risk? Certain factors may make you more likely to develop high blood pressure. Some of these risk factors are under your control, including: Smoking. Not getting enough exercise or physical activity. Being overweight. Having too much fat, sugar, calories, or salt (sodium) in your diet. Drinking too much alcohol. Other risk factors include: Having a personal history of heart disease, diabetes, high cholesterol, or kidney disease. Stress. Having a family history of high blood pressure and high cholesterol. Having obstructive sleep apnea. Age. The risk increases with age. What are the signs or symptoms? High blood pressure may not cause symptoms. Very high blood pressure (hypertensive crisis) may cause: Headache. Fast or irregular heartbeats (palpitations). Shortness of breath. Nosebleed. Nausea and vomiting. Vision changes. Severe chest pain, dizziness, and seizures. How is this diagnosed? This condition is diagnosed by  measuring your blood pressure while you are seated, with your arm resting on a flat surface, your legs uncrossed, and your feet flat on the floor. The cuff of the blood pressure monitor will be placed directly against the skin of your upper arm at the level of your heart. Blood pressure should be measured at least twice using the same arm. Certain conditions can cause a difference in blood pressure between your right and left arms. If you have a high blood pressure reading during one visit or you have normal blood pressure with other risk factors, you may be asked to: Return on a different day to have your blood pressure checked again. Monitor your blood pressure at home for 1 week or longer. If you are diagnosed with hypertension, you may have other blood or imaging tests to help your health care provider understand your overall risk for other conditions. How is this treated? This condition is treated by making healthy lifestyle changes, such as eating healthy foods, exercising more, and reducing your alcohol intake. You may be referred for counseling on a healthy diet and physical activity. Your health care provider may prescribe medicine if lifestyle changes are not enough to get your blood pressure under control and if: Your systolic blood pressure is above 130. Your diastolic blood pressure is above 80. Your personal target blood pressure may vary depending on your medical conditions, your age, and other factors. Follow these instructions at home: Eating and drinking  Eat a diet that is high in fiber and potassium, and low in sodium, added sugar, and fat. An example of this eating plan is called the DASH diet. DASH stands for Dietary Approaches to Stop Hypertension. To eat this way: Eat   plenty of fresh fruits and vegetables. Try to fill one half of your plate at each meal with fruits and vegetables. Eat whole grains, such as whole-wheat pasta, brown rice, or whole-grain bread. Fill about one  fourth of your plate with whole grains. Eat or drink low-fat dairy products, such as skim milk or low-fat yogurt. Avoid fatty cuts of meat, processed or cured meats, and poultry with skin. Fill about one fourth of your plate with lean proteins, such as fish, chicken without skin, beans, eggs, or tofu. Avoid pre-made and processed foods. These tend to be higher in sodium, added sugar, and fat. Reduce your daily sodium intake. Many people with hypertension should eat less than 1,500 mg of sodium a day. Do not drink alcohol if: Your health care provider tells you not to drink. You are pregnant, may be pregnant, or are planning to become pregnant. If you drink alcohol: Limit how much you have to: 0-1 drink a day for women. 0-2 drinks a day for men. Know how much alcohol is in your drink. In the [place] one drink equals one 12 oz bottle of beer (355 mL), one 5 oz glass of wine (148 mL), or one 1 oz glass of hard liquor (44 mL). Lifestyle  Work with your health care provider to maintain a healthy body weight or to lose weight. Ask what an ideal weight is for you. Get at least 30 minutes of exercise that causes your heart to beat faster (aerobic exercise) most days of the week. Activities may include walking, swimming, or biking. Include exercise to strengthen your muscles (resistance exercise), such as Pilates or lifting weights, as part of your weekly exercise routine. Try to do these types of exercises for 30 minutes at least 3 days a week. Do not use any products that contain nicotine or tobacco. These products include cigarettes, chewing tobacco, and vaping devices, such as e-cigarettes. If you need help quitting, ask your health care provider. Monitor your blood pressure at home as told by your health care provider. Keep all follow-up visits. This is important. Medicines Take over-the-counter and prescription medicines only as told by your health care provider. Follow directions carefully. Blood  pressure medicines must be taken as prescribed. Do not skip doses of blood pressure medicine. Doing this puts you at risk for problems and can make the medicine less effective. Ask your health care provider about side effects or reactions to medicines that you should watch for. Contact a health care provider if you: Think you are having a reaction to a medicine you are taking. Have headaches that keep coming back (recurring). Feel dizzy. Have swelling in your ankles. Have trouble with your vision. Get help right away if you: Develop a severe headache or confusion. Have unusual weakness or numbness. Feel faint. Have severe pain in your chest or abdomen. Vomit repeatedly. Have trouble breathing. These symptoms may be an emergency. Get help right away. Call 911. Do not wait to see if the symptoms will go away. Do not drive yourself to the hospital. Summary Hypertension is when the force of blood pumping through your arteries is too strong. If this condition is not controlled, it may put you at risk for serious complications. Your personal target blood pressure may vary depending on your medical conditions, your age, and other factors. For most people, a normal blood pressure is less than 120/80. Hypertension is treated with lifestyle changes, medicines, or a combination of both. Lifestyle changes include losing weight, eating a healthy,   low-sodium diet, exercising more, and limiting alcohol. This information is not intended to replace advice given to you by your health care provider. Make sure you discuss any questions you have with your health care provider. Document Revised: 06/27/2021 Document Reviewed: 06/27/2021 Elsevier Patient Education  2024 [employer]  

## 2023-02-19 NOTE — Assessment & Plan Note (Signed)
Will monitor

## 2023-02-19 NOTE — Assessment & Plan Note (Signed)
Well controlled, no changes to meds. Encouraged heart healthy diet such as the DASH diet and exercise as tolerated.  °

## 2023-02-19 NOTE — Assessment & Plan Note (Signed)
Encourage heart healthy diet such as MIND or DASH diet, increase exercise, avoid trans fats, simple carbohydrates and processed foods, consider a krill or fish or flaxseed oil cap daily. Tolerating Atorvastatin 

## 2023-02-19 NOTE — Assessment & Plan Note (Signed)
Patient encouraged to maintain heart healthy diet, regular exercise, adequate sleep. Consider daily probiotics. Take medications as prescribed. Labs ordered and reviewed. Follows with OB for paps and MGMs.  Colonoscopy was 2021 repeat in 10 years. Given and reviewed copy of ACP documents from [employer] and encouraged to complete and return

## 2023-02-19 NOTE — Assessment & Plan Note (Signed)
hgba1c acceptable, minimize simple carbs. Increase exercise as tolerated. Continue current meds 

## 2023-02-19 NOTE — Assessment & Plan Note (Signed)
Supplement and monitor 

## 2023-02-19 NOTE — Progress Notes (Unsigned)
Subjective:    Patient ID: Brandi Lester, female    DOB: [birth year], 57 y.o.   MRN: [id number]  No chief complaint on file.   HPI Discussed the use of AI scribe software for clinical note transcription with the patient, who gave verbal consent to proceed.  History of Present Illness            Past Medical History:  Diagnosis Date  . ADD (attention deficit disorder)   . Allergy    allergic rhinitis  . Anemia 07/25/2013  . Angio-edema   . Anxiety   . Arthritis    not dx'd  . Back pain   . Costochondritis 02/20/2015  . Diabetes mellitus    Type II   previously 3 years ago - no longer on meds   . Dyslipidemia 07/25/2013  . Fungus infection 06/2012   Left great toe  . GERD (gastroesophageal reflux disease)   . Hypertension   . IBS (irritable bowel syndrome)   . Ingrown nail 06/2012   right foot next to the last toe  . Insomnia 04/20/2013  . Joint pain   . Lactose intolerance   . Leg edema   . Obesity   . Pedal edema 02/20/2015  . Prediabetes   . Preventative health care 09/25/2015  . PVC (premature ventricular contraction)   . Recurrent upper respiratory infection (URI)   . Urticaria     Past Surgical History:  Procedure Laterality Date  . HYSTEROSCOPY N/A 10/04/2015   Procedure: HYSTEROSCOPY with Removal IUD;  Surgeon: Waynard Reeds, MD;  Location: [facility];  Service: Gynecology;  Laterality: N/A;  . lasik     eye surgery  . WISDOM TOOTH EXTRACTION      Family History  Problem Relation Age of Onset  . Allergic rhinitis Mother   . Heart attack Mother 48  . Hypertension Mother   . Heart disease Mother   . Obesity Mother   . Cancer Father        prostate  . Prostate cancer Father   . Allergic rhinitis Sister   . Heart attack Sister 18  . Allergic rhinitis Sister   . Allergic rhinitis Sister   . Allergic rhinitis Maternal Grandmother   . Hypertension Other   . Colon cancer Neg Hx   . Colon polyps Neg Hx   . Esophageal cancer Neg Hx   .  Stomach cancer Neg Hx   . Rectal cancer Neg Hx     Social History   Socioeconomic History  . Marital status: Single    Spouse name: Not on file  . Number of children: Not on file  . Years of education: Not on file  . Highest education level: Not on file  Occupational History  . Occupation: Runner, broadcasting/film/video, Public librarian  Tobacco Use  . Smoking status: Former    Packs/day: 1.00    Years: 15.00    Additional pack years: 0.00    Total pack years: 15.00    Types: Cigarettes    Quit date: 09/03/2001    Years since quitting: 21.4    Passive exposure: Never  . Smokeless tobacco: Never  Vaping Use  . Vaping Use: Never used  Substance and Sexual Activity  . Alcohol use: Yes    Comment: rare  . Drug use: No  . Sexual activity: Yes    Birth control/protection: None  Other Topics Concern  . Not on file  Social History Narrative  . Not on file   Social  Determinants of Health   Financial Resource Strain: Not on file  Food Insecurity: Not on file  Transportation Needs: Not on file  Physical Activity: Not on file  Stress: Not on file  Social Connections: Not on file  Intimate Partner Violence: Not on file    Outpatient Medications Prior to Visit  Medication Sig Dispense Refill  . acetaminophen (TYLENOL 8 HOUR) 650 MG CR tablet Take 1 tablet (650 mg total) every 8 (eight) hours as needed by mouth for pain.    [name] albuterol (VENTOLIN HFA) 108 (90 Base) MCG/ACT inhaler Inhale 2 puffs into the lungs every 6 (six) hours as needed for wheezing or shortness of breath. 6.7 g 1  . amLODipine (NORVASC) 5 MG tablet Take 1 tablet (5 mg total) by mouth daily. 90 tablet 0  . atorvastatin (LIPITOR) 10 MG tablet Take 1 tablet (10 mg total) by mouth daily. 90 tablet 3  . azelastine (ASTELIN) 0.1 % nasal spray Place 2 sprays into both nostrils 2 (two) times daily. 30 mL 5  . benzonatate (TESSALON) 200 MG capsule Take 1 capsule (200 mg total) by mouth 2 (two) times daily as needed for cough. 20 capsule 0  .  buPROPion (WELLBUTRIN SR) 200 MG 12 hr tablet Take 1 tablet (200 mg total) by mouth in the morning. 90 tablet 0  . Carbinoxamine Maleate 4 MG TABS Take 1 tablet (4 mg total) by mouth 4 (four) times daily as needed. 28 tablet 0  . carvedilol (COREG) 25 MG tablet Take 1 tablet (25 mg total) by mouth 2 (two) times daily. 180 tablet 3  . cholecalciferol (VITAMIN D3) 25 MCG (1000 UT) tablet Take 4,000 Units by mouth daily.    [name] doxycycline (VIBRA-TABS) 100 MG tablet Take 1 tablet (100 mg total) by mouth 2 (two) times daily. 20 tablet 0  . EPINEPHrine (EPIPEN 2-PAK) 0.3 mg/0.3 mL IJ SOAJ injection Use as directed for severe allergic reaction 2 each 1  . Fluticasone Propionate (XHANCE) 93 MCG/ACT EXHU spray 2 sprays in each nostril twice a day for nasal congestion 16 mL 5  . furosemide (LASIX) 20 MG tablet Take 1 tablet (20 mg total) by mouth daily as needed for edema (weight gain of >3 lbs/24 hours). 30 tablet 3  . hydrochlorothiazide (HYDRODIURIL) 25 MG tablet Take 1 tablet (25 mg total) by mouth daily. (Patient taking differently: Take 25 mg by mouth daily. Taking half a pill daily which is 12.5.) 90 tablet 3  . ibuprofen (ADVIL,MOTRIN) 600 MG tablet Take 1 tablet (600 mg total) by mouth every 6 (six) hours as needed. 90 tablet 0  . losartan (COZAAR) 100 MG tablet Take 1 tablet (100 mg total) by mouth daily. 90 tablet 3  . medroxyPROGESTERone (PROVERA) 10 MG tablet TAKE 1 TABLET (10 MG TOTAL) BY MOUTH DAILY. 90 tablet 3  . metFORMIN (GLUCOPHAGE) 500 MG tablet Take 1 tablet (500 mg total) by mouth 2 (two) times daily with a meal. 180 tablet 0  . ondansetron (ZOFRAN-ODT) 4 MG disintegrating tablet Take 1 tablet (4 mg total) by mouth every 8 (eight) hours as needed for nausea or vomiting. 20 tablet 0  . pantoprazole (PROTONIX) 40 MG tablet Take 1 tablet (40 mg total) by mouth daily. 90 tablet 1  . potassium chloride SA (KLOR-CON M) 20 MEQ tablet Take 1 tablet (20 mEq total) by mouth 3 (three) times daily.  270 tablet 1  . tirzepatide ([name]) 12.5 MG/0.5ML Pen Inject 12.5 mg into the skin once a  week. 2 mL 0   No facility-administered medications prior to visit.    Allergies  Allergen Reactions  . Penicillins Itching and Swelling    Has patient had a PCN reaction causing immediate rash, facial/tongue/throat swelling, SOB or lightheadedness with hypotension: yes Has patient had a PCN reaction causing severe rash involving mucus membranes or skin necrosis: no Has patient had a PCN reaction that required hospitalization no Has patient had a PCN reaction occurring within the last 10 years: yes If all of the above answers are "NO", then may proceed with Cephalosporin use.   Verdis Prime [Liraglutide]     Vomiting    Review of Systems  Constitutional:  Negative for chills, fever and malaise/fatigue.  HENT:  Negative for congestion and hearing loss.   Eyes:  Negative for blurred vision and discharge.  Respiratory:  Negative for cough, sputum production and shortness of breath.   Cardiovascular:  Negative for chest pain, palpitations and leg swelling.  Gastrointestinal:  Negative for abdominal pain, blood in stool, constipation, diarrhea, heartburn, nausea and vomiting.  Genitourinary:  Negative for dysuria, frequency, hematuria and urgency.  Musculoskeletal:  Negative for back pain, falls and myalgias.  Skin:  Negative for rash.  Neurological:  Negative for dizziness, sensory change, loss of consciousness, weakness and headaches.  Endo/Heme/Allergies:  Negative for environmental allergies. Does not bruise/bleed easily.  Psychiatric/Behavioral:  Negative for depression and suicidal ideas. The patient is not nervous/anxious and does not have insomnia.       Objective:    Physical Exam Constitutional:      General: She is not in acute distress.    Appearance: Normal appearance. She is not diaphoretic.  HENT:     Head: Normocephalic and atraumatic.     Right Ear: Tympanic membrane, ear  canal and external ear normal.     Left Ear: Tympanic membrane, ear canal and external ear normal.     Nose: Nose normal.     Mouth/Throat:     Mouth: Mucous membranes are moist.     Pharynx: Oropharynx is clear. No oropharyngeal exudate.  Eyes:     General: No scleral icterus.       Right eye: No discharge.        Left eye: No discharge.     Conjunctiva/sclera: Conjunctivae normal.     Pupils: Pupils are equal, round, and reactive to light.  Neck:     Thyroid: No thyromegaly.  Cardiovascular:     Rate and Rhythm: Normal rate and regular rhythm.     Heart sounds: Normal heart sounds. No murmur heard. Pulmonary:     Effort: Pulmonary effort is normal. No respiratory distress.     Breath sounds: Normal breath sounds. No wheezing or rales.  Abdominal:     General: Bowel sounds are normal. There is no distension.     Palpations: Abdomen is soft. There is no mass.     Tenderness: There is no abdominal tenderness.  Musculoskeletal:        General: No tenderness. Normal range of motion.     Cervical back: Normal range of motion and neck supple.  Lymphadenopathy:     Cervical: No cervical adenopathy.  Skin:    General: Skin is warm and dry.     Findings: No rash.  Neurological:     General: No focal deficit present.     Mental Status: She is alert and oriented to person, place, and time.     Cranial Nerves: No cranial nerve  deficit.     Coordination: Coordination normal.     Deep Tendon Reflexes: Reflexes are normal and symmetric. Reflexes normal.  Psychiatric:        Mood and Affect: Mood normal.        Behavior: Behavior normal.        Thought Content: Thought content normal.        Judgment: Judgment normal.   There were no vitals taken for this visit. Wt Readings from Last 3 Encounters:  02/07/23 276 lb (125.2 kg)  01/21/23 279 lb 6.4 oz (126.7 kg)  01/09/23 282 lb (127.9 kg)    Diabetic Foot Exam - Simple   No data filed    Lab Results  Component Value Date   WBC  5.5 10/22/2022   HGB 13.1 10/22/2022   HCT 38.3 10/22/2022   PLT 342.0 10/22/2022   GLUCOSE 99 10/22/2022   CHOL 114 10/22/2022   TRIG 73.0 10/22/2022   HDL 39.50 10/22/2022   LDLCALC 60 10/22/2022   ALT 15 10/22/2022   AST 11 10/22/2022   NA 140 10/22/2022   K 3.9 10/22/2022   CL 104 10/22/2022   CREATININE 0.70 10/22/2022   BUN 6 10/22/2022   CO2 27 10/22/2022   TSH 1.47 10/22/2022   HGBA1C 5.5 10/22/2022   MICROALBUR 3.7 (H) 06/18/2022    Lab Results  Component Value Date   TSH 1.47 10/22/2022   Lab Results  Component Value Date   WBC 5.5 10/22/2022   HGB 13.1 10/22/2022   HCT 38.3 10/22/2022   MCV 88.9 10/22/2022   PLT 342.0 10/22/2022   Lab Results  Component Value Date   NA 140 10/22/2022   K 3.9 10/22/2022   CO2 27 10/22/2022   GLUCOSE 99 10/22/2022   BUN 6 10/22/2022   CREATININE 0.70 10/22/2022   BILITOT 0.4 10/22/2022   ALKPHOS 88 10/22/2022   AST 11 10/22/2022   ALT 15 10/22/2022   PROT 7.2 10/22/2022   ALBUMIN 4.2 10/22/2022   CALCIUM 9.6 10/22/2022   GFR 98.04 10/22/2022   Lab Results  Component Value Date   CHOL 114 10/22/2022   Lab Results  Component Value Date   HDL 39.50 10/22/2022   Lab Results  Component Value Date   LDLCALC 60 10/22/2022   Lab Results  Component Value Date   TRIG 73.0 10/22/2022   Lab Results  Component Value Date   CHOLHDL 3 10/22/2022   Lab Results  Component Value Date   HGBA1C 5.5 10/22/2022       Assessment & Plan:  Hyperlipidemia associated with type 2 diabetes mellitus (HCC) Assessment & Plan: Encourage heart healthy diet such as MIND or DASH diet, increase exercise, avoid trans fats, simple carbohydrates and processed foods, consider a krill or fish or flaxseed oil cap daily. Tolerating Atorvastatin   Vitamin D deficiency Assessment & Plan: Supplement and monitor    High serum vitamin B12 Assessment & Plan: Will monitor   Hypertension associated with type 2 diabetes mellitus  (HCC) Assessment & Plan: Well controlled, no changes to meds. Encouraged heart healthy diet such as the DASH diet and exercise as tolerated.     Preventative health care Assessment & Plan: Patient encouraged to maintain heart healthy diet, regular exercise, adequate sleep. Consider daily probiotics. Take medications as prescribed. Labs ordered and reviewed. Follows with OB for paps and MGMs.  Colonoscopy was 2021 repeat in 10 years. Given and reviewed copy of ACP documents from [employer] and  encouraged to complete and return      Assessment and Plan              Danise Edge, MD

## 2023-02-20 ENCOUNTER — Encounter: Payer: Self-pay | Admitting: Family Medicine

## 2023-02-20 LAB — COMPREHENSIVE METABOLIC PANEL
ALT: 23 U/L (ref 0–35)
AST: 14 U/L (ref 0–37)
Albumin: 4.2 g/dL (ref 3.5–5.2)
Alkaline Phosphatase: 71 U/L (ref 39–117)
BUN: 13 mg/dL (ref 6–23)
CO2: 27 mEq/L (ref 19–32)
Calcium: 9.4 mg/dL (ref 8.4–10.5)
Chloride: 106 mEq/L (ref 96–112)
Creatinine, Ser: 0.73 mg/dL (ref 0.40–1.20)
GFR: 93.01 mL/min (ref >60.00)
Glucose, Bld: 72 mg/dL (ref 70–99)
Potassium: 3.9 mEq/L (ref 3.5–5.1)
Sodium: 142 mEq/L (ref 135–145)
Total Bilirubin: 0.3 mg/dL (ref 0.2–1.2)
Total Protein: 7.3 g/dL (ref 6.0–8.3)

## 2023-02-20 LAB — CBC WITH DIFFERENTIAL/PLATELET
Basophils Absolute: 0 10*3/uL (ref 0.0–0.1)
Basophils Relative: 0.7 % (ref 0.0–3.0)
Eosinophils Absolute: 0.1 10*3/uL (ref 0.0–0.7)
Eosinophils Relative: 1.9 % (ref 0.0–5.0)
HCT: 37.4 % (ref 36.0–46.0)
Hemoglobin: 12.3 g/dL (ref 12.0–15.0)
Lymphocytes Relative: 29.1 % (ref 12.0–46.0)
Lymphs Abs: 1.7 10*3/uL (ref 0.7–4.0)
MCHC: 32.8 g/dL (ref 30.0–36.0)
MCV: 87.6 fl (ref 78.0–100.0)
Monocytes Absolute: 0.7 10*3/uL (ref 0.1–1.0)
Monocytes Relative: 11.6 % (ref 3.0–12.0)
Neutro Abs: 3.2 10*3/uL (ref 1.4–7.7)
Neutrophils Relative %: 56.7 % (ref 43.0–77.0)
Platelets: 285 10*3/uL (ref 150.0–400.0)
RBC: 4.27 Mil/uL (ref 3.87–5.11)
RDW: 14.4 % (ref 11.5–15.5)
WBC: 5.7 10*3/uL (ref 4.0–10.5)

## 2023-02-20 LAB — VITAMIN D 25 HYDROXY (VIT D DEFICIENCY, FRACTURES): VITD: 88.5 ng/mL (ref 30.00–100.00)

## 2023-02-20 LAB — LIPID PANEL
Cholesterol: 114 mg/dL (ref 0–200)
HDL: 38.4 mg/dL — ABNORMAL LOW (ref >39.00)
LDL Cholesterol: 55 mg/dL (ref 0–99)
NonHDL: 76.06
Total CHOL/HDL Ratio: 3
Triglycerides: 104 mg/dL (ref 0.0–149.0)
VLDL: 20.8 mg/dL (ref 0.0–40.0)

## 2023-02-20 LAB — TSH: TSH: 1.49 u[IU]/mL (ref 0.35–5.50)

## 2023-02-20 LAB — INSULIN, RANDOM: Insulin: 19.6 u[IU]/mL — ABNORMAL HIGH

## 2023-02-20 LAB — HEMOGLOBIN A1C: Hgb A1c MFr Bld: 5.4 % (ref 4.6–6.5)

## 2023-03-11 ENCOUNTER — Encounter (INDEPENDENT_AMBULATORY_CARE_PROVIDER_SITE_OTHER): Payer: Self-pay | Admitting: Physician Assistant

## 2023-03-11 ENCOUNTER — Other Ambulatory Visit (HOSPITAL_BASED_OUTPATIENT_CLINIC_OR_DEPARTMENT_OTHER): Payer: Self-pay

## 2023-03-11 ENCOUNTER — Ambulatory Visit (INDEPENDENT_AMBULATORY_CARE_PROVIDER_SITE_OTHER): Payer: BC Managed Care – PPO | Admitting: Physician Assistant

## 2023-03-11 VITALS — BP 119/75 | HR 88 | Temp 98.7°F | Ht 70.0 in | Wt 279.0 lb

## 2023-03-11 DIAGNOSIS — Z7985 Long-term (current) use of injectable non-insulin antidiabetic drugs: Secondary | ICD-10-CM

## 2023-03-11 DIAGNOSIS — E785 Hyperlipidemia, unspecified: Secondary | ICD-10-CM

## 2023-03-11 DIAGNOSIS — E1169 Type 2 diabetes mellitus with other specified complication: Secondary | ICD-10-CM | POA: Diagnosis not present

## 2023-03-11 DIAGNOSIS — E559 Vitamin D deficiency, unspecified: Secondary | ICD-10-CM

## 2023-03-11 DIAGNOSIS — I152 Hypertension secondary to endocrine disorders: Secondary | ICD-10-CM

## 2023-03-11 DIAGNOSIS — Z7984 Long term (current) use of oral hypoglycemic drugs: Secondary | ICD-10-CM

## 2023-03-11 DIAGNOSIS — F3289 Other specified depressive episodes: Secondary | ICD-10-CM | POA: Diagnosis not present

## 2023-03-11 DIAGNOSIS — Z6841 Body Mass Index (BMI) 40.0 and over, adult: Secondary | ICD-10-CM

## 2023-03-11 DIAGNOSIS — E1159 Type 2 diabetes mellitus with other circulatory complications: Secondary | ICD-10-CM

## 2023-03-11 MED ORDER — TIRZEPATIDE 12.5 MG/0.5ML ~~LOC~~ SOAJ
12.5000 mg | SUBCUTANEOUS | 0 refills | Status: DC
Start: 2023-03-11 — End: 2023-04-08
  Filled 2023-03-11 – 2023-03-15 (×2): qty 2, 28d supply, fill #0

## 2023-03-11 MED ORDER — BUPROPION HCL ER (SR) 200 MG PO TB12
200.0000 mg | ORAL_TABLET | Freq: Every morning | ORAL | 0 refills | Status: DC
Start: 2023-03-11 — End: 2023-06-12
  Filled 2023-03-11: qty 90, 90d supply, fill #0

## 2023-03-11 NOTE — Progress Notes (Signed)
.smr  Office: [phone]  /  Fax: [phone]  WEIGHT SUMMARY AND BIOMETRICS  Vitals Temp: 98.7 F (37.1 C) BP: 119/75 Pulse Rate: 88 SpO2: 98 %   Anthropometric Measurements Height: 5\' 10"  (1.778 m) Weight: 279 lb (126.6 kg) BMI (Calculated): 40.03 Weight at Last Visit: 276 lb Weight Lost Since Last Visit: 0 Weight Gained Since Last Visit: 3 lb Starting Weight: 325 lb Total Weight Loss (lbs): 46 lb (20.9 kg) Peak Weight: 330 lb   Body Composition  Body Fat %: 43.7 % Fat Mass (lbs): 122.2 lbs Muscle Mass (lbs): 149.4 lbs Total Body Water (lbs): 98 lbs Visceral Fat Rating : 13   Other Clinical Data Fasting: yes Labs: no Today's Visit #: 95 Starting Date: 05/23/17     HPI  Chief Complaint: OBESITY  Brandi Lester is here to discuss her progress with her obesity treatment plan. She is on the the Category 4 Plan and states she is following her eating plan approximately 60 % of the time. She states she is exercising walking/ stationary bike 45 minutes 3 times per week.  Discussed the use of AI scribe software for clinical note transcription with the patient, who gave verbal consent to proceed.  History of Present Illness           Interval History:  Since last office visit she up 3 lbs. She has been without [name] for several weeks and just got started back.  She did note increased cravings increased appetite when off [name]. Hunger/appetite-increased when not taking [name] Cravings-increased cravings for chips and we discussed some protein chips options such as [employer] or [employer] chips.  She feels like this is going to be less of an issue if she can consistently be on [name]. 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 Exercise-she is exercising regularly but had intended to start working out at [employer] but has not yet done this and is thinking about making this a goal. Hydration-adequate  She saw her primary care physician Dr. Danise Edge  for annual wellness visit and had labs done at that visit and we discussed her fasting labs as noted below.  Pharmacotherapy: [name] 12.5 mg weekly for type 2 diabetes. Denies mass in neck, dysphagia, dyspepsia, persistent hoarseness, abdominal pain, or N/V/Constipation or diarrhea. Has annual eye exam. Mood is stable.  Metformin for Type 2 diabetes. No GI side effects.  Previously on Ozempic but switch to [facility] and notes better overall results with weight loss as well as fewer GI side effects since switch to [facility]..  TREATMENT PLAN FOR OBESITY: Obesity: Patient making dietary changes to control carbohydrate and fat intake. Struggling with cravings and consistency. -Encouraged to continue making healthy food choices. -Consider trying protein chips as a healthier alternative to regular chips. Recommended Dietary Goals  Brandi Lester is currently in the action stage of change. As such, her goal is to continue weight management plan. She has agreed to the Category 4 Plan.  Behavioral Intervention  We discussed the following Behavioral Modification Strategies today: increasing lean protein intake, decreasing simple carbohydrates , increasing vegetables, increasing lower glycemic fruits, increasing fiber rich foods, avoiding skipping meals, increasing water intake, continue to practice mindfulness when eating, planning for success, and better snacking choices.  Additional resources provided today: NA  Recommended Physical Activity Goals  Brandi Lester has been advised to work up to 150 minutes of moderate intensity aerobic activity a week and strengthening exercises 2-3 times per week for cardiovascular health, weight loss maintenance and preservation of muscle  mass.   She has agreed to Continue current level of physical activity  and Think about ways to increase daily physical activity and overcoming barriers to exercise   Pharmacotherapy We discussed various medication options to help Brandi Lester  with her weight loss efforts and we both agreed to continue [name] 12.5 mg weekly for Type 2 diabetes.    Return in about 4 weeks (around 04/08/2023).[name] She was informed of the importance of frequent follow up visits to maximize her success with intensive lifestyle modifications for her multiple health conditions.  PHYSICAL EXAM:  Blood pressure 119/75, pulse 88, temperature 98.7 F (37.1 C), height 5\' 10"  (1.778 m), weight 279 lb (126.6 kg), SpO2 98 %. Body mass index is 40.03 kg/m.  General: She is overweight, cooperative, alert, well developed, and in no acute distress. PSYCH: Has normal mood, affect and thought process.   Cardiovascular: HR 80's regular, BP 119/75 Lungs: Normal breathing effort, no conversational dyspnea. Neuro: no focal deficits  DIAGNOSTIC DATA REVIEWED:  BMET    Component Value Date/Time   NA 142 02/19/2023 1452   NA 139 10/20/2020 0933   K 3.9 02/19/2023 1452   CL 106 02/19/2023 1452   CO2 27 02/19/2023 1452   GLUCOSE 72 02/19/2023 1452   BUN 13 02/19/2023 1452   BUN 8 10/20/2020 0933   CREATININE 0.73 02/19/2023 1452   CREATININE 0.79 08/10/2022 1503   CALCIUM 9.4 02/19/2023 1452   GFRNONAA 103 10/20/2020 0933   GFRAA 119 10/20/2020 0933   Lab Results  Component Value Date   HGBA1C 5.4 02/19/2023   HGBA1C 6.0 10/14/2008   Lab Results  Component Value Date   INSULIN 20.7 12/01/2019   INSULIN 31.1 (H) 05/23/2017   Lab Results  Component Value Date   TSH 1.49 02/19/2023   CBC    Component Value Date/Time   WBC 5.7 02/19/2023 1452   RBC 4.27 02/19/2023 1452   HGB 12.3 02/19/2023 1452   HGB 12.3 07/07/2019 0900   HCT 37.4 02/19/2023 1452   HCT 37.3 07/07/2019 0900   PLT 285.0 02/19/2023 1452   PLT 304 07/07/2019 0900   MCV 87.6 02/19/2023 1452   MCV 84 07/07/2019 0900   MCH 28.3 11/28/2020 1358   MCHC 32.8 02/19/2023 1452   RDW 14.4 02/19/2023 1452   RDW 14.2 07/07/2019 0900   Iron Studies No results found for: "IRON", "TIBC",  "FERRITIN", "IRONPCTSAT" Lipid Panel     Component Value Date/Time   CHOL 114 02/19/2023 1452   CHOL 129 07/07/2019 0900   TRIG 104.0 02/19/2023 1452   HDL 38.40 (L) 02/19/2023 1452   HDL 33 (L) 07/07/2019 0900   CHOLHDL 3 02/19/2023 1452   VLDL 20.8 02/19/2023 1452   LDLCALC 55 02/19/2023 1452   LDLCALC 64 11/28/2020 1358   Hepatic Function Panel     Component Value Date/Time   PROT 7.3 02/19/2023 1452   PROT 7.0 12/01/2019 0941   ALBUMIN 4.2 02/19/2023 1452   ALBUMIN 4.2 12/01/2019 0941   AST 14 02/19/2023 1452   ALT 23 02/19/2023 1452   ALKPHOS 71 02/19/2023 1452   BILITOT 0.3 02/19/2023 1452   BILITOT 0.3 12/01/2019 0941   BILIDIR 0.0 06/09/2014 0843   IBILI 0.1 (L) 02/03/2014 1648      Component Value Date/Time   TSH 1.49 02/19/2023 1452   Nutritional Lab Results  Component Value Date   VD25OH 88.50 02/19/2023   VD25OH 77.64 10/22/2022   VD25OH 68.76 06/18/2022    ASSOCIATED CONDITIONS  ADDRESSED TODAY  ASSESSMENT AND PLAN  Problem List Items Addressed This Visit     Hyperlipidemia associated with type 2 diabetes mellitus (HCC)   Relevant Medications   tirzepatide ([name]) 12.5 MG/0.5ML Pen   Vitamin D deficiency   Depression   Relevant Medications   buPROPion (WELLBUTRIN SR) 200 MG 12 hr tablet   Hypertension associated with type 2 diabetes mellitus (HCC)   Relevant Medications   tirzepatide ([name]) 12.5 MG/0.5ML Pen   Morbid obesity (HCC)   Relevant Medications   tirzepatide ([name]) 12.5 MG/0.5ML Pen   Diabetes mellitus (HCC) - Primary   Relevant Medications   tirzepatide ([name]) 12.5 MG/0.5ML Pen   BMI 40.0-44.9, adult (HCC) Current BMI 40.1   Relevant Medications   tirzepatide ([name]) 12.5 MG/0.5ML Pen   Type 2 Diabetes Mellitus with other specified complication, without long-term current use of insulin Labs were reviewed today and discussed with the patient.   HgbA1c is at goal. Last A1c was 5.4- at goal. Insulin 19.4  improved, but not yet at goal.  CBGs: Not checking Episodes of hypoglycemia: no Medication(s): [name] 12.5 mg SQ weekly Denies mass in neck, dysphagia, dyspepsia, persistent hoarseness, abdominal pain, or N/V/Constipation or diarrhea. Has annual eye exam. Mood is stable.  She had previously been on [name] 15 mg once weekly but was having difficulty obtaining the medication due to drug shortages.  She appears to be doing well on the 12.5 mg weekly and we discussed continuing on this dose at this time. Metformin 500 mg twice daily. No GI or other side effects with metformin.   Lab Results  Component Value Date   HGBA1C 5.4 02/19/2023   HGBA1C 5.5 10/22/2022   HGBA1C 5.7 06/18/2022   Lab Results  Component Value Date   MICROALBUR 3.7 (H) 06/18/2022   LDLCALC 55 02/19/2023   CREATININE 0.73 02/19/2023   Lab Results  Component Value Date   GFR 93.01 02/19/2023   GFR 98.04 10/22/2022   GFR 101.59 06/18/2022    Plan: Continue and refill [name] 12.5 mg SQ weekly Continue metformin 500 mg twice daily.  Continue working on nutrition plan to decrease simple carbohydrates, increase lean proteins and exercise to promote weight loss and improve glycemic control .    Hypertension Labs were reviewed today and discussed with the patient.   Hypertension improved, no significant medication side effects noted, and needs further observation.  Medication(s): Losartan 100 mg daily   Carvedilol 25 mg twice daily   Amlodipine 5 mg daily   Hydrochlorothiazide 12.5 mg daily   (Lasix 20 mg daily as needed for leg edema)  Renal function is normal  BP Readings from Last 3 Encounters:  03/11/23 119/75  02/19/23 128/78  02/07/23 131/80   Lab Results  Component Value Date   CREATININE 0.73 02/19/2023   CREATININE 0.70 10/22/2022   CREATININE 0.79 08/10/2022   Lab Results  Component Value Date   GFR 93.01 02/19/2023   GFR 98.04 10/22/2022   GFR 101.59 06/18/2022    Plan: Continue  all antihypertensives at current dosages.  Continue to monitor blood pressure closely as may be able to taper or perhaps even discontinue amlodipine at some point in the future should she continue to have good blood pressure control with continued treatment with tirzepatide with continued weight loss. Continue to work on nutrition plan to promote weight loss and improve BP control.    Hyperlipidemia Labs were reviewed today and discussed with the patient.   LDL is at goal.  Triglycerides at goal.  HDL not at goal but stable. Medication(s): Atorvastatin 10 mg daily Cardiovascular risk factors: diabetes mellitus, dyslipidemia, hypertension, and obesity (BMI >= 30 kg/m2)  Lab Results  Component Value Date   CHOL 114 02/19/2023   HDL 38.40 (L) 02/19/2023   LDLCALC 55 02/19/2023   TRIG 104.0 02/19/2023   CHOLHDL 3 02/19/2023   CHOLHDL 3 10/22/2022   CHOLHDL 3 06/18/2022   Lab Results  Component Value Date   ALT 23 02/19/2023   AST 14 02/19/2023   ALKPHOS 71 02/19/2023   BILITOT 0.3 02/19/2023   The ASCVD Risk score (Arnett DK, et al., 2019) failed to calculate for the following reasons:   The valid total cholesterol range is 130 to 320 mg/dL  Plan: Continue atorvastatin 10 mg daily.  Continue treatment with GLP-1/GIP medication to hopefully further reduce cardiovascular risks. Continue to work on nutrition plan -decreasing simple carbohydrates, increasing lean proteins, decreasing saturated fats and cholesterol , avoiding trans fats and exercise as able to promote weight loss, improve lipids and decrease cardiovascular risks.   Vitamin D Deficiency Labs were reviewed today and discussed with the patient.   Vitamin D is at goal of 50.  Most recent vitamin D level was 88.5. She is on OTC vitamin D she thinks she is taking 4000 units by mouth daily but not totally sure of dosing.  She reports no nausea/vomiting or muscle weakness on OTC vitamin D.. Lab Results  Component Value Date    VD25OH 88.50 02/19/2023   VD25OH 77.64 10/22/2022   VD25OH 68.76 06/18/2022    Plan: Continue and decrease dose OTC vitamin D to once every Monday Wednesday Friday.  Will plan to recheck vitamin D level in 3 to 4 months to avoid oversupplementation Low vitamin D levels can be associated with adiposity and may result in leptin resistance and weight gain. Also associated with fatigue. Currently on vitamin D supplementation without any adverse effects.    Other depression/emotional eating Brandi Lester has had issues with stress/emotional eating. Currently this is moderately controlled. Overall mood is stable. Medication(s): Bupropion SR 200 mg daily in am  Plan: Continue and refill Bupropion SR 200 mg daily in am She continues to work on emotional eating strategies.   ATTESTASTION STATEMENTS:  Reviewed by clinician on day of visit: allergies, medications, problem list, medical history, surgical history, family history, social history, and previous encounter notes.   I have personally spent 48 minutes total time today in preparation, patient care, nutritional counseling and documentation for this visit, including the following: review of clinical lab tests; review of medical tests/procedures/services.      Brandi Behnke, PA-C

## 2023-03-15 ENCOUNTER — Other Ambulatory Visit (HOSPITAL_BASED_OUTPATIENT_CLINIC_OR_DEPARTMENT_OTHER): Payer: Self-pay

## 2023-03-26 ENCOUNTER — Ambulatory Visit (HOSPITAL_BASED_OUTPATIENT_CLINIC_OR_DEPARTMENT_OTHER): Payer: BC Managed Care – PPO | Admitting: Pulmonary Disease

## 2023-03-26 ENCOUNTER — Encounter (HOSPITAL_BASED_OUTPATIENT_CLINIC_OR_DEPARTMENT_OTHER): Payer: Self-pay | Admitting: Pulmonary Disease

## 2023-03-26 VITALS — BP 118/76 | HR 85 | Resp 15 | Ht 70.0 in | Wt 283.0 lb

## 2023-03-26 DIAGNOSIS — R0683 Snoring: Secondary | ICD-10-CM

## 2023-03-26 NOTE — Patient Instructions (Signed)
Will arrange for a home sleep study Will call to arrange for follow up after sleep study reviewed  

## 2023-03-26 NOTE — Progress Notes (Signed)
[name]  Chief Complaint  Patient presents with   New Patient (Initial Visit)    New patient having issues sleeping  having trouble staying a sleep getting 4-5 hours of sleep a night, she is not napping doing the day.     Past Surgical History:  She  has a past surgical history that includes lasik; Wisdom tooth extraction; and Hysteroscopy (N/A, 10/04/2015).  Past Medical History:  ADD, Allergies, Anemia, Anxiety, Back pain, DM type 2, HLD, GERD, HTN, IBS, Lactose intolerance, Urticaria  Constitutional:  BP 118/76   Pulse 85   Resp 15   Ht 5\' 10"  (1.778 m)   Wt 283 lb (128.4 kg)   SpO2 98%   BMI 40.61 kg/m   Brief Summary:  Brandi Lester is a 57 y.o. female with fatigue and restless sleep.      Subjective:   She has trouble staying asleep.  This has been an issue for a while.  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  She feels fatigued in the late afternoon and evening.  She has several relatives that have sleep apnea and use CPAP.  She has ring device that monitors her sleep pattern and heart rate variability.  The connected phone APP shows her heart rate variability is abnormal and that she has disrupted sleep.  She goes to sleep between 9 and 10 pm.  She falls asleep in 10 to 15 minutes.  She wakes up 1 time to use the bathroom at around midnight.  She can fall back to sleep after this.  She wakes up again around 3 am, and then has trouble falling back to sleep.  She gets out of bed at 530 am.  She feels okay in the morning, but starts feeling tired later in the day.  She denies morning headache.  She uses benadryl at night sometimes to help with allergies, and this can help her fall asleep. She drinks decaffeinated coffee.  She denies sleep walking, sleep talking, bruxism, or nightmares.  There is no history of restless legs.  She denies sleep hallucinations, sleep paralysis, or cataplexy.  The Epworth score is 3 out of  24.   Physical Exam:   Appearance - well kempt   ENMT - no sinus tenderness, no oral exudate, no LAN, Mallampati 4 airway, no stridor  Respiratory - equal breath sounds bilaterally, no wheezing or rales  CV - s1s2 regular rate and rhythm, no murmurs  Ext - no clubbing, no edema  Skin - no rashes  Psych - normal mood and affect   Sleep Tests:    Cardiac Tests:  Echo 11/14/20 >> EF 60 to 65%, grade 1 DD  Social History:  She  reports that she quit smoking about 21 years ago. Her smoking use included cigarettes. She started smoking about 36 years ago. She has a 15 pack-year smoking history. She has never been exposed to tobacco smoke. She has never used smokeless tobacco. She reports current alcohol use. She reports that she does not use drugs.  Family History:  Her family history includes Allergic rhinitis in her maternal grandmother, mother, sister, sister, and sister; Cancer in her father; Heart attack (age of onset: 74) in her sister; Heart attack (age of onset: 40) in her mother; Heart disease in her mother; Hypertension in her mother and another family member; Obesity in her mother; Prostate cancer in her father.    Discussion:  She has snoring, sleep disruption, apnea,  and daytime sleepiness.  She has history of hypertension and diabetes, and family history of sleep apnea.  Her BMI is > 35.  I am concerned she could have obstructive sleep apnea.  Assessment/Plan:   Snoring with excessive daytime sleepiness. - will need to arrange for a home sleep study  Obesity. - discussed how weight can impact sleep and risk for sleep disordered breathing - discussed options to assist with weight loss: combination of diet modification, cardiovascular and strength training exercises  Cardiovascular risk. - had an extensive discussion regarding the adverse health consequences related to untreated sleep disordered breathing - specifically discussed the risks for hypertension, coronary  artery disease, cardiac dysrhythmias, cerebrovascular disease, and diabetes - lifestyle modification discussed  Safe driving practices. - discussed how sleep disruption can increase risk of accidents, particularly when driving - safe driving practices were discussed  Therapies for obstructive sleep apnea. - if the sleep study shows significant sleep apnea, then various therapies for treatment were reviewed: CPAP, oral appliance, and surgical interventions  Time Spent Involved in Patient Care on Day of Examination:  36 minutes  Follow up:   Patient Instructions  Will arrange for a home sleep study Will call to arrange for follow up after sleep study reviewed   Medication List:   Allergies as of 03/26/2023       Reactions   Penicillins Itching, Swelling   Has patient had a PCN reaction causing immediate rash, facial/tongue/throat swelling, SOB or lightheadedness with hypotension: yes Has patient had a PCN reaction causing severe rash involving mucus membranes or skin necrosis: no Has patient had a PCN reaction that required hospitalization no Has patient had a PCN reaction occurring within the last 10 years: yes If all of the above answers are "NO", then may proceed with Cephalosporin use.   Victoza [liraglutide]    Vomiting        Medication List        Accurate as of March 26, 2023  9:22 AM. If you have any questions, ask your nurse or doctor.          STOP taking these medications    benzonatate 200 MG capsule Commonly known as: TESSALON Stopped by: Coralyn Helling   furosemide 20 MG tablet Commonly known as: LASIX Stopped by: Coralyn Helling       TAKE these medications    acetaminophen 650 MG CR tablet Commonly known as: Tylenol 8 Hour Take 1 tablet (650 mg total) every 8 (eight) hours as needed by mouth for pain.   albuterol 108 (90 Base) MCG/ACT inhaler Commonly known as: VENTOLIN HFA Inhale 2 puffs into the lungs every 6 (six) hours as needed for  wheezing or shortness of breath.   amLODipine 5 MG tablet Commonly known as: NORVASC Take 1 tablet (5 mg total) by mouth daily.   atorvastatin 10 MG tablet Commonly known as: LIPITOR Take 1 tablet (10 mg total) by mouth daily.   Azelastine HCl 137 MCG/SPRAY Soln Place 2 sprays into both nostrils 2 (two) times daily.   buPROPion 200 MG 12 hr tablet Commonly known as: WELLBUTRIN SR Take 1 tablet (200 mg total) by mouth in the morning.   Carbinoxamine Maleate 4 MG Tabs Take 1 tablet (4 mg total) by mouth 4 (four) times daily as needed.   carvedilol 25 MG tablet Commonly known as: COREG Take 1 tablet (25 mg total) by mouth 2 (two) times daily.   cholecalciferol 25 MCG (1000 UNIT) tablet Commonly known as: VITAMIN D3  Take 4,000 Units by mouth daily.   EPINEPHrine 0.3 mg/0.3 mL Soaj injection Commonly known as: EpiPen 2-Pak Use as directed for severe allergic reaction   hydrochlorothiazide 12.5 MG capsule Commonly known as: MICROZIDE Take 1 capsule (12.5 mg total) by mouth daily.   ibuprofen 600 MG tablet Commonly known as: ADVIL Take 1 tablet (600 mg total) by mouth every 6 (six) hours as needed.   losartan 100 MG tablet Commonly known as: COZAAR Take 1 tablet (100 mg total) by mouth daily.   medroxyPROGESTERone 10 MG tablet Commonly known as: PROVERA TAKE 1 TABLET (10 MG TOTAL) BY MOUTH DAILY.   metFORMIN 500 MG tablet Commonly known as: GLUCOPHAGE Take 1 tablet (500 mg total) by mouth 2 (two) times daily with a meal.   [name] 12.5 MG/0.5ML Pen Generic drug: tirzepatide Inject 12.5 mg into the skin once a week.   ondansetron 4 MG disintegrating tablet Commonly known as: ZOFRAN-ODT Take 1 tablet (4 mg total) by mouth every 8 (eight) hours as needed for nausea or vomiting.   pantoprazole 40 MG tablet Commonly known as: PROTONIX Take 1 tablet (40 mg total) by mouth daily.   potassium chloride SA 20 MEQ tablet Commonly known as: KLOR-CON M Take 1 tablet (20  mEq total) by mouth 3 (three) times daily.   Xhance 93 MCG/ACT Exhu Generic drug: Fluticasone Propionate spray 2 sprays in each nostril twice a day for nasal congestion        Signature:  Coralyn Helling, MD [facility] Pager - [phone] 03/26/2023, 9:22 AM

## 2023-04-07 ENCOUNTER — Other Ambulatory Visit: Payer: Self-pay | Admitting: Obstetrics and Gynecology

## 2023-04-08 ENCOUNTER — Other Ambulatory Visit (HOSPITAL_BASED_OUTPATIENT_CLINIC_OR_DEPARTMENT_OTHER): Payer: Self-pay

## 2023-04-08 ENCOUNTER — Other Ambulatory Visit: Payer: Self-pay

## 2023-04-08 ENCOUNTER — Ambulatory Visit (INDEPENDENT_AMBULATORY_CARE_PROVIDER_SITE_OTHER): Payer: BC Managed Care – PPO | Admitting: Physician Assistant

## 2023-04-08 ENCOUNTER — Encounter (INDEPENDENT_AMBULATORY_CARE_PROVIDER_SITE_OTHER): Payer: Self-pay | Admitting: Physician Assistant

## 2023-04-08 VITALS — BP 113/75 | HR 86 | Temp 98.7°F | Ht 70.0 in | Wt 280.0 lb

## 2023-04-08 DIAGNOSIS — Z7985 Long-term (current) use of injectable non-insulin antidiabetic drugs: Secondary | ICD-10-CM

## 2023-04-08 DIAGNOSIS — E785 Hyperlipidemia, unspecified: Secondary | ICD-10-CM

## 2023-04-08 DIAGNOSIS — E559 Vitamin D deficiency, unspecified: Secondary | ICD-10-CM | POA: Diagnosis not present

## 2023-04-08 DIAGNOSIS — F3289 Other specified depressive episodes: Secondary | ICD-10-CM | POA: Diagnosis not present

## 2023-04-08 DIAGNOSIS — I152 Hypertension secondary to endocrine disorders: Secondary | ICD-10-CM

## 2023-04-08 DIAGNOSIS — E1169 Type 2 diabetes mellitus with other specified complication: Secondary | ICD-10-CM | POA: Diagnosis not present

## 2023-04-08 DIAGNOSIS — Z6841 Body Mass Index (BMI) 40.0 and over, adult: Secondary | ICD-10-CM

## 2023-04-08 DIAGNOSIS — E1159 Type 2 diabetes mellitus with other circulatory complications: Secondary | ICD-10-CM

## 2023-04-08 MED ORDER — AMLODIPINE BESYLATE 5 MG PO TABS
5.0000 mg | ORAL_TABLET | Freq: Every day | ORAL | 0 refills | Status: DC
Start: 2023-04-08 — End: 2023-06-12
  Filled 2023-04-08: qty 90, 90d supply, fill #0

## 2023-04-08 MED ORDER — TIRZEPATIDE 12.5 MG/0.5ML ~~LOC~~ SOAJ
12.5000 mg | SUBCUTANEOUS | 0 refills | Status: DC
Start: 2023-04-08 — End: 2023-05-08
  Filled 2023-04-08: qty 2, 28d supply, fill #0

## 2023-04-08 NOTE — Progress Notes (Signed)
.smr  Office: [phone]  /  Fax: [phone]  WEIGHT SUMMARY AND BIOMETRICS  Vitals Temp: 98.7 F (37.1 C) BP: 113/75 Pulse Rate: 86 SpO2: 100 %   Anthropometric Measurements Height: 5\' 10"  (1.778 m) Weight: 280 lb (127 kg) BMI (Calculated): 40.18 Weight at Last Visit: 279 lb Weight Lost Since Last Visit: 0 Weight Gained Since Last Visit: 1 lb Starting Weight: 325 lb Total Weight Loss (lbs): 45 lb (20.4 kg) Peak Weight: 330 lb   Body Composition  Body Fat %: 46.8 % Fat Mass (lbs): 131.2 lbs Muscle Mass (lbs): 141.6 lbs Total Body Water (lbs): 105.6 lbs Visceral Fat Rating : 14   Other Clinical Data Fasting: no Labs: no Today's Visit #: 96 Starting Date: 05/23/17     HPI  Chief Complaint: OBESITY  Brandi Lester is here to discuss her progress with her obesity treatment plan. She is on the the Category 4 Plan and states she is following her eating plan approximately 50 % of the time. She states she is exercising 0 minutes 0 times per week. Discussed the use of AI scribe software for clinical note transcription with the patient, who gave verbal consent to proceed.  History of Present Illness    Interval History:  Since last office visit she is up 1 lb    The patient, with a history of obesity, Type 2 diabetes, hypertension, and emotional eating behavior, presents for a follow-up visit. She reports that her current medication, [name] 12.5mg , is effective in controlling her appetite. However, she experienced severe gas pains and bloating after consuming a Frosty from [employer], which she attributes to her lactose intolerance. The patient also mentions a craving for chocolate, which she has been managing with [employer] products.  In addition to her obesity treatment, the patient has been undergoing a sleep study for suspected sleep apnea. She expresses concerns about the comfort and effectiveness of potential CPAP treatments. 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  The patient acknowledges a recent lack of exercise and a slight weight gain, but expresses a commitment to returning to her exercise routine and managing her weight. She also expresses interest in monitoring her blood sugar levels to better understand the effects of her diet on her body.  Pharmacotherapy: [name] 12.5 mg weekly for type 2 diabetes. Denies mass in neck, dysphagia, dyspepsia, persistent hoarseness, abdominal pain, or N/V/Constipation or diarrhea. Has annual eye exam. Mood is stable.  Metformin for Type 2 diabetes. No GI side effects.  Previously on Ozempic but switch to [facility] and notes better overall results with weight loss as well as fewer GI side effects since switch to [facility]..  TREATMENT PLAN FOR OBESITY: Obesity Patient is on a weight loss regimen with [name] 12.5mg . Recent weight gain noted, possibly due to dietary indiscretions. No significant adverse effects reported, except for a transient episode of abdominal pain and bloating after consuming a high-fat food item Farrel Demark). -Continue [name] 12.5mg . -Encourage adherence to dietary plan, avoiding high-fat foods. -Consider continuous glucose monitoring for better understanding of dietary impact on blood glucose levels. Patient to discuss with primary care provider. Recommended Dietary Goals  Brandi Lester is currently in the action stage of change. As such, her goal is to continue weight management plan. She has agreed to the Category 4 Plan.  Behavioral Intervention  We discussed the following Behavioral Modification Strategies today: increasing lean  protein intake, decreasing simple carbohydrates , increasing vegetables, increasing lower glycemic fruits, increasing fiber rich foods, avoiding skipping meals, increasing water intake, decreasing eating out or consumption of processed foods, and  making healthy choices when eating convenient foods, emotional eating strategies and understanding the difference between hunger signals and cravings, avoiding temptations and identifying enticing environmental cues, continue to practice mindfulness when eating, and planning for success.  Additional resources provided today: NA  Recommended Physical Activity Goals  Brandi Lester has been advised to work up to 150 minutes of moderate intensity aerobic activity a week and strengthening exercises 2-3 times per week for cardiovascular health, weight loss maintenance and preservation of muscle mass.   She has agreed to Continue current level of physical activity  and Increase the intensity, frequency or duration of aerobic exercises     Pharmacotherapy We discussed various medication options to help Alek with her weight loss efforts and we both agreed to Continue [name] 12.5 mg weekly for Type 2 diabetes and metformin 500 mg twice daily for Type 2 diabetes.    Return in about 4 weeks (around 05/06/2023).[name] She was informed of the importance of frequent follow up visits to maximize her success with intensive lifestyle modifications for her multiple health conditions.  PHYSICAL EXAM:  Blood pressure 113/75, pulse 86, temperature 98.7 F (37.1 C), height 5\' 10"  (1.778 m), weight 280 lb (127 kg), SpO2 100%. Body mass index is 40.18 kg/m.  General: She is overweight, cooperative, alert, well developed, and in no acute distress. PSYCH: Has normal mood, affect and thought process.   Cardiovascular: HR 80's regular, BP 113/76 Lungs: Normal breathing effort, no conversational dyspnea. Neuro: no focal deficits  DIAGNOSTIC DATA REVIEWED:  BMET    Component Value Date/Time   NA 142 02/19/2023 1452   NA 139 10/20/2020 0933   K 3.9 02/19/2023 1452   CL 106 02/19/2023 1452   CO2 27 02/19/2023 1452   GLUCOSE 72 02/19/2023 1452   BUN 13 02/19/2023 1452   BUN 8 10/20/2020 0933   CREATININE 0.73  02/19/2023 1452   CREATININE 0.79 08/10/2022 1503   CALCIUM 9.4 02/19/2023 1452   GFRNONAA 103 10/20/2020 0933   GFRAA 119 10/20/2020 0933   Lab Results  Component Value Date   HGBA1C 5.4 02/19/2023   HGBA1C 6.0 10/14/2008   Lab Results  Component Value Date   INSULIN 20.7 12/01/2019   INSULIN 31.1 (H) 05/23/2017   Lab Results  Component Value Date   TSH 1.49 02/19/2023   CBC    Component Value Date/Time   WBC 5.7 02/19/2023 1452   RBC 4.27 02/19/2023 1452   HGB 12.3 02/19/2023 1452   HGB 12.3 07/07/2019 0900   HCT 37.4 02/19/2023 1452   HCT 37.3 07/07/2019 0900   PLT 285.0 02/19/2023 1452   PLT 304 07/07/2019 0900   MCV 87.6 02/19/2023 1452   MCV 84 07/07/2019 0900   MCH 28.3 11/28/2020 1358   MCHC 32.8 02/19/2023 1452   RDW 14.4 02/19/2023 1452   RDW 14.2 07/07/2019 0900   Iron Studies No results found for: "IRON", "TIBC", "FERRITIN", "IRONPCTSAT" Lipid Panel     Component Value Date/Time   CHOL 114 02/19/2023 1452   CHOL 129 07/07/2019 0900   TRIG 104.0 02/19/2023 1452   HDL 38.40 (L) 02/19/2023 1452   HDL 33 (L) 07/07/2019 0900   CHOLHDL 3 02/19/2023 1452   VLDL 20.8 02/19/2023 1452   LDLCALC 55 02/19/2023 1452   LDLCALC 64 11/28/2020 1358  Hepatic Function Panel     Component Value Date/Time   PROT 7.3 02/19/2023 1452   PROT 7.0 12/01/2019 0941   ALBUMIN 4.2 02/19/2023 1452   ALBUMIN 4.2 12/01/2019 0941   AST 14 02/19/2023 1452   ALT 23 02/19/2023 1452   ALKPHOS 71 02/19/2023 1452   BILITOT 0.3 02/19/2023 1452   BILITOT 0.3 12/01/2019 0941   BILIDIR 0.0 06/09/2014 0843   IBILI 0.1 (L) 02/03/2014 1648      Component Value Date/Time   TSH 1.49 02/19/2023 1452   Nutritional Lab Results  Component Value Date   VD25OH 88.50 02/19/2023   VD25OH 77.64 10/22/2022   VD25OH 68.76 06/18/2022    ASSOCIATED CONDITIONS ADDRESSED TODAY  ASSESSMENT AND PLAN  Problem List Items Addressed This Visit     Hyperlipidemia associated with type 2  diabetes mellitus (HCC)   Relevant Medications   amLODipine (NORVASC) 5 MG tablet   tirzepatide ([name]) 12.5 MG/0.5ML Pen   Vitamin D deficiency   Depression   Hypertension associated with type 2 diabetes mellitus (HCC)   Relevant Medications   amLODipine (NORVASC) 5 MG tablet   tirzepatide ([name]) 12.5 MG/0.5ML Pen   Morbid obesity (HCC)   Relevant Medications   tirzepatide ([name]) 12.5 MG/0.5ML Pen   Diabetes mellitus (HCC) - Primary   Relevant Medications   tirzepatide ([name]) 12.5 MG/0.5ML Pen   BMI 40.0-44.9, adult (HCC) Current BMI 40.1   Relevant Medications   tirzepatide ([name]) 12.5 MG/0.5ML Pen    Type 2 Diabetes Patient is on a weight loss regimen which also helps with glycemic control. No recent blood glucose readings available. -Consider continuous glucose monitoring for better understanding of dietary impact on blood glucose levels. Patient to discuss with primary care provider.  Hypertension Hypertension well controlled, asymptomatic, and no significant medication side effects noted.  Medication(s): amlodipine 5 mg daily   Carvedilol 25 mg daily   Hydrochlorothiazide 12.5 mg daily   Losartan 100 mg daily  Renal function normal.   BP Readings from Last 3 Encounters:  04/08/23 113/75  03/26/23 118/76  03/11/23 119/75   Lab Results  Component Value Date   CREATININE 0.73 02/19/2023   CREATININE 0.70 10/22/2022   CREATININE 0.79 08/10/2022   Lab Results  Component Value Date   GFR 93.01 02/19/2023   GFR 98.04 10/22/2022   GFR 101.59 06/18/2022    Plan: Continue all antihypertensives at current dosages. Anticipate further reduction in antihypertensive medications with continued [name] therapy.  May be able to stop amlodipine or taper further.  Continue to work on nutrition plan to promote weight loss and improve BP control.     Emotional Eating Behavior Patient reported a recent episode of craving and consuming a high-fat food  item Farrel Demark), leading to abdominal discomfort. Continue wellbutrin 200 mg daily. No side effects with medication.  -Continue with dietary counseling and strategies to manage cravings.  Sleep Apnea Patient recently completed a sleep study, awaiting results. -Follow up on sleep study results and discuss potential need for CPAP therapy.  General Health Maintenance -Continue regular physical activity, aiming for 45 minutes on exercise bike 3-4 days a week. -Follow up appointment scheduled for Wednesday, September 4th.  ATTESTASTION STATEMENTS:  Reviewed by clinician on day of visit: allergies, medications, problem list, medical history, surgical history, family history, social history, and previous encounter notes.   I have personally spent 38 minutes total time today in preparation, patient care, nutritional counseling and documentation for this visit, including the following: review of  clinical lab tests; review of medical tests/procedures/services.       , PA-C

## 2023-04-09 ENCOUNTER — Other Ambulatory Visit: Payer: Self-pay

## 2023-04-09 ENCOUNTER — Other Ambulatory Visit (HOSPITAL_BASED_OUTPATIENT_CLINIC_OR_DEPARTMENT_OTHER): Payer: Self-pay

## 2023-04-09 MED ORDER — MEDROXYPROGESTERONE ACETATE 10 MG PO TABS
10.0000 mg | ORAL_TABLET | Freq: Every day | ORAL | 0 refills | Status: DC
  Filled 2023-04-09: qty 90, 90d supply, fill #0

## 2023-04-17 ENCOUNTER — Encounter (INDEPENDENT_AMBULATORY_CARE_PROVIDER_SITE_OTHER): Payer: BC Managed Care – PPO

## 2023-04-17 ENCOUNTER — Encounter (HOSPITAL_BASED_OUTPATIENT_CLINIC_OR_DEPARTMENT_OTHER): Payer: Self-pay | Admitting: Pulmonary Disease

## 2023-04-17 ENCOUNTER — Encounter (HOSPITAL_BASED_OUTPATIENT_CLINIC_OR_DEPARTMENT_OTHER): Payer: Self-pay

## 2023-04-17 ENCOUNTER — Telehealth: Payer: Self-pay | Admitting: Pulmonary Disease

## 2023-04-17 ENCOUNTER — Encounter (HOSPITAL_BASED_OUTPATIENT_CLINIC_OR_DEPARTMENT_OTHER): Payer: BC Managed Care – PPO | Admitting: Pulmonary Disease

## 2023-04-17 DIAGNOSIS — G4733 Obstructive sleep apnea (adult) (pediatric): Secondary | ICD-10-CM

## 2023-04-17 DIAGNOSIS — R0683 Snoring: Secondary | ICD-10-CM

## 2023-04-17 NOTE — Telephone Encounter (Signed)
HST 04/06/23 >> AHI 27, SpO2 low 82%   Please inform her that her sleep study shows moderate obstructive sleep apnea.  Please arrange for ROV with me or NP to discuss treatment options.

## 2023-04-20 ENCOUNTER — Encounter: Payer: Self-pay | Admitting: Family Medicine

## 2023-04-23 ENCOUNTER — Other Ambulatory Visit (HOSPITAL_BASED_OUTPATIENT_CLINIC_OR_DEPARTMENT_OTHER): Payer: Self-pay

## 2023-04-23 ENCOUNTER — Other Ambulatory Visit: Payer: Self-pay

## 2023-04-23 MED ORDER — DEXCOM G7 RECEIVER DEVI
2 refills | Status: DC
Start: 2023-04-23 — End: 2023-07-15
  Filled 2023-04-23: qty 6, 60d supply, fill #0

## 2023-04-23 NOTE — Telephone Encounter (Signed)
Not sure you will need to call your insurance to see what they will cover.

## 2023-04-23 NOTE — Telephone Encounter (Signed)
Sent pt mychart message and sent Dexcom.

## 2023-04-24 ENCOUNTER — Other Ambulatory Visit (HOSPITAL_COMMUNITY): Payer: Self-pay

## 2023-04-24 ENCOUNTER — Other Ambulatory Visit (HOSPITAL_BASED_OUTPATIENT_CLINIC_OR_DEPARTMENT_OTHER): Payer: Self-pay

## 2023-04-25 ENCOUNTER — Other Ambulatory Visit (HOSPITAL_BASED_OUTPATIENT_CLINIC_OR_DEPARTMENT_OTHER): Payer: Self-pay

## 2023-04-26 ENCOUNTER — Other Ambulatory Visit (HOSPITAL_BASED_OUTPATIENT_CLINIC_OR_DEPARTMENT_OTHER): Payer: Self-pay

## 2023-04-29 ENCOUNTER — Other Ambulatory Visit (HOSPITAL_BASED_OUTPATIENT_CLINIC_OR_DEPARTMENT_OTHER): Payer: Self-pay

## 2023-04-30 ENCOUNTER — Other Ambulatory Visit (HOSPITAL_BASED_OUTPATIENT_CLINIC_OR_DEPARTMENT_OTHER): Payer: Self-pay

## 2023-05-01 ENCOUNTER — Telehealth: Payer: Self-pay | Admitting: Pharmacy Technician

## 2023-05-01 ENCOUNTER — Other Ambulatory Visit (HOSPITAL_COMMUNITY): Payer: Self-pay

## 2023-05-01 NOTE — Telephone Encounter (Signed)
Pharmacy Patient Advocate Encounter   Received notification from CoverMyMeds that prior authorization for [facility] G7 Receiver device is required/requested.   Insurance verification completed.   The patient is insured through [facility] .   Per test claim: PA required; PA submitted to [facility] via CoverMyMeds Key/confirmation #/EOC [id number] Status is pending

## 2023-05-02 ENCOUNTER — Other Ambulatory Visit (HOSPITAL_BASED_OUTPATIENT_CLINIC_OR_DEPARTMENT_OTHER): Payer: Self-pay

## 2023-05-02 NOTE — Telephone Encounter (Signed)
Appt scheduled for 05/27/23

## 2023-05-08 ENCOUNTER — Encounter (INDEPENDENT_AMBULATORY_CARE_PROVIDER_SITE_OTHER): Payer: Self-pay | Admitting: Physician Assistant

## 2023-05-08 ENCOUNTER — Other Ambulatory Visit (HOSPITAL_BASED_OUTPATIENT_CLINIC_OR_DEPARTMENT_OTHER): Payer: Self-pay

## 2023-05-08 ENCOUNTER — Ambulatory Visit (INDEPENDENT_AMBULATORY_CARE_PROVIDER_SITE_OTHER): Payer: BC Managed Care – PPO | Admitting: Physician Assistant

## 2023-05-08 VITALS — BP 117/73 | HR 89 | Temp 99.2°F | Ht 70.0 in | Wt 280.0 lb

## 2023-05-08 DIAGNOSIS — E1159 Type 2 diabetes mellitus with other circulatory complications: Secondary | ICD-10-CM | POA: Diagnosis not present

## 2023-05-08 DIAGNOSIS — Z6841 Body Mass Index (BMI) 40.0 and over, adult: Secondary | ICD-10-CM

## 2023-05-08 DIAGNOSIS — I152 Hypertension secondary to endocrine disorders: Secondary | ICD-10-CM | POA: Diagnosis not present

## 2023-05-08 DIAGNOSIS — Z7984 Long term (current) use of oral hypoglycemic drugs: Secondary | ICD-10-CM

## 2023-05-08 DIAGNOSIS — E559 Vitamin D deficiency, unspecified: Secondary | ICD-10-CM | POA: Diagnosis not present

## 2023-05-08 DIAGNOSIS — E669 Obesity, unspecified: Secondary | ICD-10-CM

## 2023-05-08 DIAGNOSIS — Z7985 Long-term (current) use of injectable non-insulin antidiabetic drugs: Secondary | ICD-10-CM

## 2023-05-08 DIAGNOSIS — E1169 Type 2 diabetes mellitus with other specified complication: Secondary | ICD-10-CM | POA: Diagnosis not present

## 2023-05-08 MED ORDER — METFORMIN HCL 500 MG PO TABS
500.0000 mg | ORAL_TABLET | Freq: Two times a day (BID) | ORAL | 0 refills | Status: DC
Start: 2023-05-08 — End: 2023-08-07
  Filled 2023-05-08: qty 180, 90d supply, fill #0

## 2023-05-08 MED ORDER — TIRZEPATIDE 12.5 MG/0.5ML ~~LOC~~ SOAJ
12.5000 mg | SUBCUTANEOUS | 0 refills | Status: DC
Start: 2023-05-08 — End: 2023-06-12
  Filled 2023-05-08: qty 2, 28d supply, fill #0

## 2023-05-08 MED ORDER — ONDANSETRON 4 MG PO TBDP
4.0000 mg | ORAL_TABLET | Freq: Three times a day (TID) | ORAL | 0 refills | Status: DC | PRN
Start: 2023-05-08 — End: 2023-06-12
  Filled 2023-05-08: qty 18, 6d supply, fill #0

## 2023-05-08 NOTE — Progress Notes (Signed)
.smr  Office: [phone]  /  Fax: [phone]  WEIGHT SUMMARY AND BIOMETRICS  Vitals Temp: 99.2 F (37.3 C) BP: 117/73 Pulse Rate: 89 SpO2: 99 %   Anthropometric Measurements Height: 5\' 10"  (1.778 m) Weight: 280 lb (127 kg) BMI (Calculated): 40.18 Weight at Last Visit: 280 lb Weight Lost Since Last Visit: 0 Weight Gained Since Last Visit: 0 Starting Weight: 325 lb Total Weight Loss (lbs): 45 lb (20.4 kg) Peak Weight: 330 lb   Body Composition  Body Fat %: 44.1 % Fat Mass (lbs): 123.6 lbs Muscle Mass (lbs): 148.8 lbs Total Body Water (lbs): 106.8 lbs Visceral Fat Rating : 13   Other Clinical Data Fasting: no Labs: no Today's Visit #: 97 Starting Date: 05/23/17     HPI  Chief Complaint: OBESITY  Brandi Lester is here to discuss her progress with her obesity treatment plan. She is on the the Category 4 Plan and states she is following her eating plan approximately 80 % of the time. She states she is exercising walking/bike 45+ minutes 3-4 times per week.   Interval History:  Since last office visit she maintained her weight.  Bio impedence scale reviewed with the patient:  Up 7.2 lbs muscle mass! Down 7.6 adipose mass ! Hunger/appetite-moderate control Cravings- not excessive Stress- increased with start of school year- but manageable overall Sleep- SLeep study showed OSA and has follow up scheduled with pulmonary.  Exercise-Increased muscle mass with increased exercise. Great work!!! Hydration-adequate   Pharmacotherapy: [name] 12.5 mg weekly. Denies mass in neck, dysphagia, dyspepsia, persistent hoarseness, abdominal pain, or V/Constipation or diarrhea. Has annual eye exam. Mood is stable. Some nausea for first day or so after injection.   Metformin twice daily. Will have her hold metformin for several days to 1 week after the next injection to see if nausea less noticeable. If no change or increased constipation, advised to resume usual metformin.     TREATMENT PLAN FOR OBESITY:  Recommended Dietary Goals  Brandi Lester is currently in the action stage of change. As such, her goal is to continue weight management plan. She has agreed to the Category 4 Plan.  Behavioral Intervention  We discussed the following Behavioral Modification Strategies today: increasing lean protein intake, decreasing simple carbohydrates , increasing vegetables, increasing lower glycemic fruits, avoiding skipping meals, increasing water intake, emotional eating strategies and understanding the difference between hunger signals and cravings, work on managing stress, creating time for self-care and relaxation measures, continue to practice mindfulness when eating, and planning for success.  Additional resources provided today: NA  Recommended Physical Activity Goals  Brandi Lester has been advised to work up to 150 minutes of moderate intensity aerobic activity a week and strengthening exercises 2-3 times per week for cardiovascular health, weight loss maintenance and preservation of muscle mass.   She has agreed to Continue current level of physical activity    Pharmacotherapy We discussed various medication options to help Brandi Lester with her weight loss efforts and we both agreed to continue [name] 12.5 mg weekly .    Return in about 4 weeks (around 06/05/2023).[name] She was informed of the importance of frequent follow up visits to maximize her success with intensive lifestyle modifications for her multiple health conditions.  PHYSICAL EXAM:  Blood pressure 117/73, pulse 89, temperature 99.2 F (37.3 C), height 5\' 10"  (1.778 m), weight 280 lb (127 kg), SpO2 99%. Body mass index is 40.18 kg/m.  General: She is overweight, cooperative, alert, well developed, and in no acute distress. PSYCH: Has  normal mood, affect and thought process.   Cardiovascular: HR 80's, BP 117/73 Lungs: Normal breathing effort, no conversational dyspnea.z Neuro: no focal  deficits  DIAGNOSTIC DATA REVIEWED:  BMET    Component Value Date/Time   NA 142 02/19/2023 1452   NA 139 10/20/2020 0933   K 3.9 02/19/2023 1452   CL 106 02/19/2023 1452   CO2 27 02/19/2023 1452   GLUCOSE 72 02/19/2023 1452   BUN 13 02/19/2023 1452   BUN 8 10/20/2020 0933   CREATININE 0.73 02/19/2023 1452   CREATININE 0.79 08/10/2022 1503   CALCIUM 9.4 02/19/2023 1452   GFRNONAA 103 10/20/2020 0933   GFRAA 119 10/20/2020 0933   Lab Results  Component Value Date   HGBA1C 5.4 02/19/2023   HGBA1C 6.0 10/14/2008   Lab Results  Component Value Date   INSULIN 20.7 12/01/2019   INSULIN 31.1 (H) 05/23/2017   Lab Results  Component Value Date   TSH 1.49 02/19/2023   CBC    Component Value Date/Time   WBC 5.7 02/19/2023 1452   RBC 4.27 02/19/2023 1452   HGB 12.3 02/19/2023 1452   HGB 12.3 07/07/2019 0900   HCT 37.4 02/19/2023 1452   HCT 37.3 07/07/2019 0900   PLT 285.0 02/19/2023 1452   PLT 304 07/07/2019 0900   MCV 87.6 02/19/2023 1452   MCV 84 07/07/2019 0900   MCH 28.3 11/28/2020 1358   MCHC 32.8 02/19/2023 1452   RDW 14.4 02/19/2023 1452   RDW 14.2 07/07/2019 0900   Iron Studies No results found for: "IRON", "TIBC", "FERRITIN", "IRONPCTSAT" Lipid Panel     Component Value Date/Time   CHOL 114 02/19/2023 1452   CHOL 129 07/07/2019 0900   TRIG 104.0 02/19/2023 1452   HDL 38.40 (L) 02/19/2023 1452   HDL 33 (L) 07/07/2019 0900   CHOLHDL 3 02/19/2023 1452   VLDL 20.8 02/19/2023 1452   LDLCALC 55 02/19/2023 1452   LDLCALC 64 11/28/2020 1358   Hepatic Function Panel     Component Value Date/Time   PROT 7.3 02/19/2023 1452   PROT 7.0 12/01/2019 0941   ALBUMIN 4.2 02/19/2023 1452   ALBUMIN 4.2 12/01/2019 0941   AST 14 02/19/2023 1452   ALT 23 02/19/2023 1452   ALKPHOS 71 02/19/2023 1452   BILITOT 0.3 02/19/2023 1452   BILITOT 0.3 12/01/2019 0941   BILIDIR 0.0 06/09/2014 0843   IBILI 0.1 (L) 02/03/2014 1648      Component Value Date/Time   TSH 1.49  02/19/2023 1452   Nutritional Lab Results  Component Value Date   VD25OH 88.50 02/19/2023   VD25OH 77.64 10/22/2022   VD25OH 68.76 06/18/2022    ASSOCIATED CONDITIONS ADDRESSED TODAY  ASSESSMENT AND PLAN  Problem List Items Addressed This Visit     Vitamin D deficiency   Hypertension associated with type 2 diabetes mellitus (HCC)   Relevant Medications   metFORMIN (GLUCOPHAGE) 500 MG tablet   tirzepatide ([name]) 12.5 MG/0.5ML Pen   Morbid obesity (HCC)   Relevant Medications   metFORMIN (GLUCOPHAGE) 500 MG tablet   tirzepatide ([name]) 12.5 MG/0.5ML Pen   Diabetes mellitus (HCC) - Primary   Relevant Medications   metFORMIN (GLUCOPHAGE) 500 MG tablet   tirzepatide ([name]) 12.5 MG/0.5ML Pen   ondansetron (ZOFRAN-ODT) 4 MG disintegrating tablet   BMI 40.0-44.9, adult (HCC) Current BMI 40.1   Relevant Medications   metFORMIN (GLUCOPHAGE) 500 MG tablet   tirzepatide ([name]) 12.5 MG/0.5ML Pen  Type 2 Diabetes Mellitus with other specified complication, without long-term  current use of insulin HgbA1c is at goal. Last A1c was 5.4 She was unable to get insurance approval for CMG. We did discuss that an OTC DEXCOM is available from [facility] now and she might consider ordering to try for a month.  Medication(s): [name] 12.5 mg SQ weekly and Metformin 500 mg twice daily with meals Denies mass in neck, dysphagia, dyspepsia, persistent hoarseness, abdominal pain, or V/Constipation or diarrhea. Has annual eye exam. Mood is stable. Some nausea day of and day following injection. Will refill Zofran for nausea prn, but also discussed holding metformin for several days to see if this effects nausea.  No side effects with metformin.  Lab Results  Component Value Date   HGBA1C 5.4 02/19/2023   HGBA1C 5.5 10/22/2022   HGBA1C 5.7 06/18/2022   Lab Results  Component Value Date   MICROALBUR 3.7 (H) 06/18/2022   LDLCALC 55 02/19/2023   CREATININE 0.73 02/19/2023   Lab Results   Component Value Date   GFR 93.01 02/19/2023   GFR 98.04 10/22/2022   GFR 101.59 06/18/2022    Plan: Continue and refill [name] 12.5 mg SQ weekly and Metformin 500 mg twice daily with meals Refill zofran and monitor off metformin to see if nausea improved on [name] only.  Continue working on nutrition plan to decrease simple carbohydrates, increase lean proteins and exercise to promote weight loss and improve glycemic control .  Recheck labs over the next 1-2 months.   Hypertension Hypertension well controlled, asymptomatic, and no significant medication side effects noted.  Medication(s): amlodipine 5 mg daily                         Carvedilol 25 mg daily                         Hydrochlorothiazide 12.5 mg daily                         Losartan 100 mg daily  BP Readings from Last 3 Encounters:  05/08/23 117/73  04/17/23 138/72  04/08/23 113/75   Lab Results  Component Value Date   CREATININE 0.73 02/19/2023   CREATININE 0.70 10/22/2022   CREATININE 0.79 08/10/2022   Lab Results  Component Value Date   GFR 93.01 02/19/2023   GFR 98.04 10/22/2022   GFR 101.59 06/18/2022    Plan: Continue all antihypertensives at current dosages. Continue to work on nutrition plan to promote weight loss and improve BP control.  Continue to monitor closely. Suspect may be able to decrease medications with further weight loss/continued treatment with [name].  Recheck labs over the next 1-2 months.   Vitamin D Deficiency Vitamin D is at goal of 50.  Most recent vitamin D level was 88.5. She is on OTC vitamin D3 1000 IU daily. Lab Results  Component Value Date   VD25OH 88.50 02/19/2023   VD25OH 77.64 10/22/2022   VD25OH 68.76 06/18/2022    Plan: Continue OTC vitamin D3 1000 IU daily Low vitamin D levels can be associated with adiposity and may result in leptin resistance and weight gain. Also associated with fatigue. Currently on vitamin D supplementation without any adverse  effects.  Plan to recheck labs over the next 1-2 months to optimize supplementation/avoid over supplementation.   ATTESTASTION STATEMENTS:  Reviewed by clinician on day of visit: allergies, medications, problem list, medical history, surgical history, family history, social history, and previous  encounter notes.   I have personally spent 46 minutes total time today in preparation, patient care, nutritional counseling and documentation for this visit, including the following: review of clinical lab tests; review of medical tests/procedures/services.      Alfonzo Arca, PA-C

## 2023-05-09 ENCOUNTER — Other Ambulatory Visit (HOSPITAL_BASED_OUTPATIENT_CLINIC_OR_DEPARTMENT_OTHER): Payer: Self-pay

## 2023-05-14 NOTE — Telephone Encounter (Signed)
Pharmacy Patient Advocate Encounter  Received notification from [facility] that Prior Authorization for [facility] G7 Receiver device has been DENIED.  Full denial letter will be uploaded to the media tab. See denial reason below.  Your plan only covers this product if you will be using it with an intensive insulin regimen. We have denied your request because: A) You  are not (or will not be) taking it, and B) You do not have a medical reason not to take it. We reviewed the information we had. Your request has been denied. Your doctor can send [name] any new or missing information for [name] to review. For this product, you may have to meet other criteria. You can request the policy for more details. You can also request other plan documents for your review.   PA #/Case ID/Reference #: [id number]  Please be advised we currently do not have a Pharmacist to review denials, therefore you will need to process appeals accordingly as needed. Thanks for your support at this time. Contact for appeals are as follows: Phone: [phone], Fax: [phone]

## 2023-05-15 NOTE — Telephone Encounter (Signed)
Sent pt message let her know about PA.

## 2023-05-27 ENCOUNTER — Ambulatory Visit: Payer: BC Managed Care – PPO | Admitting: Adult Health

## 2023-05-27 ENCOUNTER — Encounter: Payer: Self-pay | Admitting: Adult Health

## 2023-05-27 VITALS — BP 128/80 | HR 97 | Ht 70.0 in | Wt 283.0 lb

## 2023-05-27 DIAGNOSIS — Z23 Encounter for immunization: Secondary | ICD-10-CM

## 2023-05-27 DIAGNOSIS — Z6841 Body Mass Index (BMI) 40.0 and over, adult: Secondary | ICD-10-CM

## 2023-05-27 DIAGNOSIS — G4733 Obstructive sleep apnea (adult) (pediatric): Secondary | ICD-10-CM

## 2023-05-27 NOTE — Addendum Note (Signed)
Addended by: Delrae Rend on: 05/27/2023 04:45 PM   Modules accepted: Orders

## 2023-05-27 NOTE — Assessment & Plan Note (Signed)
Healthy weight loss

## 2023-05-27 NOTE — Patient Instructions (Addendum)
Begin CPAP at bedtime, wear all night long for at least 6hrs.  Healthy sleep regimen Do not drive if sleepy Work on healthy weight Follow-up in 3 months and as needed

## 2023-05-27 NOTE — Progress Notes (Signed)
@Patient  ID: Brandi Lester, female    DOB: [birth year], 57 y.o.   MRN: [id number]  Chief Complaint  Patient presents with   Follow-up    Referring provider: Bradd Canary, MD  HPI: 57 year old female seen for sleep consult March 26, 2023 for snoring and daytime sleepiness found to have moderate obstructive sleep apnea  TEST/EVENTS :  HST 04/06/23 >> AHI 27, SpO2 low 82%   05/27/2023 Follow up : OSA  Patient presents for a 23-month follow-up.  Patient was seen last visit for a sleep consult for snoring and daytime sleepiness.  She was set up for a home sleep study that was done on April 06, 2023 that showed moderate obstructive sleep apnea with AHI of 27/hour and SpO2 low at 82%.  We discussed her sleep study results in detail.  Went over treatment options.  Patient would like to proceed with CPAP therapy.  Is working on weight loss .   Allergies  Allergen Reactions   Penicillins Itching and Swelling    Has patient had a PCN reaction causing immediate rash, facial/tongue/throat swelling, SOB or lightheadedness with hypotension: yes Has patient had a PCN reaction causing severe rash involving mucus membranes or skin necrosis: no Has patient had a PCN reaction that required hospitalization no Has patient had a PCN reaction occurring within the last 10 years: yes If all of the above answers are "NO", then may proceed with Cephalosporin use.    Victoza [Liraglutide]     Vomiting    Immunization History  Administered Date(s) Administered   Influenza Whole 06/22/2009, 08/03/2010   Influenza,inj,Quad PF,6+ Mos 07/20/2013, 05/11/2014, 06/29/2015, 06/26/2016, 07/23/2017, 06/04/2019, 05/31/2020, 06/06/2021, 06/18/2022   Influenza-Unspecified 06/17/2014, 07/23/2017   Moderna Covid-19 Vaccine Bivalent Booster 55yrs & up 06/06/2021   PFIZER Comirnaty(Gray Top)Covid-19 Tri-Sucrose Vaccine 03/24/2021   PFIZER(Purple Top)SARS-COV-2 Vaccination 10/31/2019, 11/21/2019, 07/07/2020    Pneumococcal Conjugate-13 09/15/2015   Pneumococcal Polysaccharide-23 07/01/2018   Td 07/21/2009   Tdap 05/31/2020   Zoster Recombinant(Shingrix) 12/15/2020, 03/24/2021    Past Medical History:  Diagnosis Date   ADD (attention deficit disorder)    Allergy    allergic rhinitis   Anemia 07/25/2013   Angio-edema    Anxiety    Arthritis    not dx'd   Back pain    Costochondritis 02/20/2015   Diabetes mellitus    Type II   previously 3 years ago - no longer on meds    Dyslipidemia 07/25/2013   Fungus infection 06/2012   Left great toe   GERD (gastroesophageal reflux disease)    Hypertension    IBS (irritable bowel syndrome)    Ingrown nail 06/2012   right foot next to the last toe   Insomnia 04/20/2013   Joint pain    Lactose intolerance    Leg edema    Obesity    Pedal edema 02/20/2015   Prediabetes    Preventative health care 09/25/2015   PVC (premature ventricular contraction)    Recurrent upper respiratory infection (URI)    Urticaria     Tobacco History: Social History   Tobacco Use  Smoking Status Former   Current packs/day: 0.00   Average packs/day: 1 pack/day for 15.0 years (15.0 ttl pk-yrs)   Types: Cigarettes   Start date: 09/03/1986   Quit date: 09/03/2001   Years since quitting: 21.7   Passive exposure: Never  Smokeless Tobacco Never   Counseling given: Not Answered   Outpatient Medications Prior to Visit  Medication Sig Dispense Refill  acetaminophen (TYLENOL 8 HOUR) 650 MG CR tablet Take 1 tablet (650 mg total) every 8 (eight) hours as needed by mouth for pain.     albuterol (VENTOLIN HFA) 108 (90 Base) MCG/ACT inhaler Inhale 2 puffs into the lungs every 6 (six) hours as needed for wheezing or shortness of breath. 6.7 g 1   amLODipine (NORVASC) 5 MG tablet Take 1 tablet (5 mg total) by mouth daily. 90 tablet 0   atorvastatin (LIPITOR) 10 MG tablet Take 1 tablet (10 mg total) by mouth daily. 90 tablet 3   buPROPion (WELLBUTRIN SR) 200 MG 12 hr  tablet Take 1 tablet (200 mg total) by mouth in the morning. 90 tablet 0   Carbinoxamine Maleate 4 MG TABS Take 1 tablet (4 mg total) by mouth 4 (four) times daily as needed. 28 tablet 0   carvedilol (COREG) 25 MG tablet Take 1 tablet (25 mg total) by mouth 2 (two) times daily. 180 tablet 3   cholecalciferol (VITAMIN D3) 25 MCG (1000 UT) tablet Take 4,000 Units by mouth daily.     Continuous Glucose Receiver (DEXCOM G7 RECEIVER) DEVI Use to check blood sugar as needed. 6 each 2   EPINEPHrine (EPIPEN 2-PAK) 0.3 mg/0.3 mL IJ SOAJ injection Use as directed for severe allergic reaction 2 each 1   Fluticasone Propionate (XHANCE) 93 MCG/ACT EXHU spray 2 sprays in each nostril twice a day for nasal congestion 16 mL 5   hydrochlorothiazide (MICROZIDE) 12.5 MG capsule Take 1 capsule (12.5 mg total) by mouth daily. 90 capsule 1   ibuprofen (ADVIL,MOTRIN) 600 MG tablet Take 1 tablet (600 mg total) by mouth every 6 (six) hours as needed. 90 tablet 0   losartan (COZAAR) 100 MG tablet Take 1 tablet (100 mg total) by mouth daily. 90 tablet 3   medroxyPROGESTERone (PROVERA) 10 MG tablet Take 1 tablet (10 mg total) by mouth daily. 90 tablet 0   metFORMIN (GLUCOPHAGE) 500 MG tablet Take 1 tablet (500 mg total) by mouth 2 (two) times daily with a meal. 180 tablet 0   ondansetron (ZOFRAN-ODT) 4 MG disintegrating tablet Dissolve 1 tablet (4 mg total) by mouth every 8 (eight) hours as needed for nausea or vomiting. 20 tablet 0   pantoprazole (PROTONIX) 40 MG tablet Take 1 tablet (40 mg total) by mouth daily. 90 tablet 0   potassium chloride SA (KLOR-CON M) 20 MEQ tablet Take 1 tablet (20 mEq total) by mouth 3 (three) times daily. 270 tablet 1   tirzepatide ([name]) 12.5 MG/0.5ML Pen Inject 12.5 mg into the skin once a week. 2 mL 0   No facility-administered medications prior to visit.     Review of Systems:   Constitutional:   No  weight loss, night sweats,  Fevers, chills,  +fatigue, or  lassitude.  HEENT:    No headaches,  Difficulty swallowing,  Tooth/dental problems, or  Sore throat,                No sneezing, itching, ear ache, nasal congestion, post nasal drip,   CV:  No chest pain,  Orthopnea, PND, swelling in lower extremities, anasarca, dizziness, palpitations, syncope.   GI  No heartburn, indigestion, abdominal pain, nausea, vomiting, diarrhea, change in bowel habits, loss of appetite, bloody stools.   Resp: No shortness of breath with exertion or at rest.  No excess mucus, no productive cough,  No non-productive cough,  No coughing up of blood.  No change in color of mucus.  No wheezing.  No chest wall deformity  Skin: no rash or lesions.  GU: no dysuria, change in color of urine, no urgency or frequency.  No flank pain, no hematuria   MS:  No joint pain or swelling.  No decreased range of motion.  No back pain.    Physical Exam  BP 128/80 (BP Location: Left Arm, Patient Position: Sitting, Cuff Size: Large)   Pulse 97   Ht 5\' 10"  (1.778 m)   Wt 283 lb (128.4 kg)   SpO2 100%   BMI 40.61 kg/m   GEN: A/Ox3; pleasant , NAD, well nourished    HEENT:  [place]/AT,  NOSE-clear, THROAT-clear, no lesions, no postnasal drip or exudate noted. Class 3-4 MP airway   NECK:  Supple w/ fair ROM; no JVD; normal carotid impulses w/o bruits; no thyromegaly or nodules palpated; no lymphadenopathy.    RESP  Clear  P & A; w/o, wheezes/ rales/ or rhonchi. no accessory muscle use, no dullness to percussion  CARD:  RRR, no m/r/g, no peripheral edema, pulses intact, no cyanosis or clubbing.  GI:   Soft & nt; nml bowel sounds; no organomegaly or masses detected.   Musco: Warm bil, no deformities or joint swelling noted.   Neuro: alert, no focal deficits noted.    Skin: Warm, no lesions or rashes    Lab Results:      BNP No results found for: "BNP"  ProBNP No results found for: "PROBNP"  Imaging: No results found.  Administration History     None           No data to display           No results found for: "NITRICOXIDE"      Assessment & Plan:   OSA (obstructive sleep apnea) Moderate OSA - HOme sleep study on 04/06/23 showed moderate OSA with AHI at 27/hr. Will start CPAP -auto CPAP 5 to 15 cmH2O. [name] HST reviewed in detail .   Plan  Patient Instructions  Begin CPAP at bedtime, wear all night long for at least 6hrs.  Healthy sleep regimen Do not drive if sleepy Work on healthy weight Follow-up in 3 months and as needed    BMI 40.0-44.9, adult (HCC) Current BMI 40.1 Healthy weight loss      Rubye Oaks, NP 05/27/2023

## 2023-05-27 NOTE — Assessment & Plan Note (Signed)
Moderate OSA - HOme sleep study on 04/06/23 showed moderate OSA with AHI at 27/hr. Will start CPAP -auto CPAP 5 to 15 cmH2O. [name] HST reviewed in detail .   Plan  Patient Instructions  Begin CPAP at bedtime, wear all night long for at least 6hrs.  Healthy sleep regimen Do not drive if sleepy Work on healthy weight Follow-up in 3 months and as needed

## 2023-05-29 ENCOUNTER — Other Ambulatory Visit (HOSPITAL_BASED_OUTPATIENT_CLINIC_OR_DEPARTMENT_OTHER): Payer: Self-pay

## 2023-05-29 MED ORDER — COMIRNATY 30 MCG/0.3ML IM SUSY
0.3000 mL | PREFILLED_SYRINGE | Freq: Once | INTRAMUSCULAR | 0 refills | Status: AC
  Filled 2023-05-29: qty 0.3, 1d supply, fill #0

## 2023-05-29 NOTE — Progress Notes (Signed)
Reviewed and agree with assessment/plan.   Coralyn Helling, MD [facility] Pulmonary/Critical Care 05/29/2023, 8:37 AM Pager:  [phone]

## 2023-06-03 ENCOUNTER — Encounter: Payer: Self-pay | Admitting: Obstetrics and Gynecology

## 2023-06-03 ENCOUNTER — Other Ambulatory Visit (HOSPITAL_BASED_OUTPATIENT_CLINIC_OR_DEPARTMENT_OTHER): Payer: Self-pay

## 2023-06-03 ENCOUNTER — Ambulatory Visit (INDEPENDENT_AMBULATORY_CARE_PROVIDER_SITE_OTHER): Payer: BC Managed Care – PPO | Admitting: Obstetrics and Gynecology

## 2023-06-03 ENCOUNTER — Other Ambulatory Visit (HOSPITAL_COMMUNITY)
Admission: RE | Admit: 2023-06-03 | Discharge: 2023-06-03 | Disposition: A | Discharge status: Home / Self Care | Payer: BC Managed Care – PPO | Source: Ambulatory Visit | Attending: Obstetrics and Gynecology | Admitting: Obstetrics and Gynecology

## 2023-06-03 VITALS — BP 131/81 | HR 85 | Ht 70.0 in | Wt 280.0 lb

## 2023-06-03 DIAGNOSIS — Z1339 Encounter for screening examination for other mental health and behavioral disorders: Secondary | ICD-10-CM | POA: Diagnosis not present

## 2023-06-03 DIAGNOSIS — N939 Abnormal uterine and vaginal bleeding, unspecified: Secondary | ICD-10-CM | POA: Insufficient documentation

## 2023-06-03 DIAGNOSIS — D219 Benign neoplasm of connective and other soft tissue, unspecified: Secondary | ICD-10-CM | POA: Diagnosis not present

## 2023-06-03 DIAGNOSIS — Z01419 Encounter for gynecological examination (general) (routine) without abnormal findings: Secondary | ICD-10-CM | POA: Diagnosis not present

## 2023-06-03 MED ORDER — MEDROXYPROGESTERONE ACETATE 10 MG PO TABS
10.0000 mg | ORAL_TABLET | Freq: Every day | ORAL | 3 refills | Status: DC
Start: 2023-06-03 — End: 2024-06-30
  Filled 2023-06-03 – 2023-07-02 (×2): qty 90, 90d supply, fill #0
  Filled 2023-10-03: qty 90, 90d supply, fill #1
  Filled 2024-01-13: qty 90, 90d supply, fill #2
  Filled 2024-04-08: qty 90, 90d supply, fill #3

## 2023-06-03 NOTE — Progress Notes (Signed)
ANNUAL EXAM Patient name: Brandi Lester MRN [id number]  Date of birth: 11-03-1965 Chief Complaint:   No chief complaint on file.  History of Present Illness:   Brandi Lester is a 57 y.o. G0P0000 female being seen today for a routine annual exam.   Current concerns: Irregular bleeding that comes and goes without any consistent pattern. She has not gone one year without bleeding. She has known fibroids with largest being 9-10 cm and overall uterine size was 14 cm. She has occasional hot flashes. She is taking Provera daily.   She is not sexually active and was last active one year ago.   No LMP recorded. (Menstrual status: Irregular Periods).   Last MXR: Feb 2023.  Last Pap/Pap History:  Normal: 2021 - pap/HPV wnl    Health Maintenance Due  Topic Date Due   FOOT EXAM  06/06/2022   Diabetic kidney evaluation - Urine ACR  06/19/2023    Review of Systems:   Pertinent items are noted in HPI Denies any headaches, blurred vision, fatigue, shortness of breath, chest pain, abdominal pain, abnormal vaginal discharge/itching/odor/irritation, bowel movements, urination, or intercourse unless otherwise stated above.  Pertinent History Reviewed:  Reviewed past medical,surgical, social and family history.  Reviewed problem list, medications and allergies. Physical Assessment:   Vitals:   06/03/23 0935  BP: 131/81  Pulse: 85  Weight: 280 lb (127 kg)  Height: 5\' 10"  (1.778 m)  Body mass index is 40.18 kg/m.   Physical Examination:  General appearance - well appearing, and in no distress Mental status - alert, oriented to person, place, and time Psych:  She has a normal mood and affect Skin - warm and dry, normal color, no suspicious lesions noted Chest - effort normal Heart - normal rate  Breasts - breasts appear normal, no suspicious masses, no skin or nipple changes or axillary nodes Abdomen - soft, nontender, nondistended, no masses or organomegaly Pelvic -   VULVA: normal appearing vulva with no masses, tenderness or lesions  VAGINA: normal appearing vagina with normal color and discharge, no lesions  CERVIX: normal appearing cervix without discharge or lesions, no CMT UTERUS: uterus is felt to be enlarged consistent with fibroids  ADNEXA: No adnexal masses or tenderness noted. Extremities:  No swelling or varicosities noted  Chaperone present for exam    GYNECOLOGY OFFICE PROCEDURE NOTE   ENDOMETRIAL BIOPSY     The indications for endometrial biopsy were reviewed.   Risks of the biopsy including cramping, bleeding, infection, uterine perforation, inadequate specimen and need for additional procedures were discussed. Offered alternative of hysteroscopy, dilation and curettage in OR. The patient states she understands the R/B/I/A and agrees to undergo procedure today. Urine pregnancy test was Not indicated. Consent was signed. Time out was performed.    Patient was positioned in dorsal lithotomy position. A vaginal speculum was placed.  The cervix was visualized and was prepped with Betadine.  A single-toothed tenaculum was placed on the anterior lip of the cervix to stabilize it. The 3 mm pipelle was easily introduced into the endometrial cavity without difficulty to a depth of 10 cm, and a Moderate amount of tissue was obtained after two passes and sent to pathology. The instruments were removed from the patient's vagina. Minimal bleeding from the cervix was noted. The patient tolerated the procedure well.  Assessment & Plan:  Diagnoses and all orders for this visit:  Encounter for annual routine gynecological examination - Cervical cancer screening: Discussed guidelines. Pap with HPV  done - Breast Health: Encouraged self breast awareness/SBE. Discussed limits of clinical breast exam for detecting breast cancer. Discussed importance of annual MXR. MXR is up to date: 10/2022 - Climacteric/Sexual health: Reviewed typical and atypical symptoms of  menopause/peri-menopause. Discussed PMB and to call if any amount of spotting.  - Colonoscopy:  2021 - F/U 12 months and prn  -     Cytology - PAP( [facility]) -     MM 3D SCREENING MAMMOGRAM BILATERAL BREAST; Future  Abnormal uterine bleeding (AUB) Check FSH.  EMB done today as a precaution.  Continue Provera until [facility] confirmed menopausal.  -     Surgical pathology -     medroxyPROGESTERone (PROVERA) 10 MG tablet; Take 1 tablet (10 mg total) by mouth daily. -     FSH  Fibroids - Uterus continues to feel about 16 week size consistent with my exam from last year.  - Discussed no intervention required unless she has issues with bleeding or pain or they are growing once she is through menopause. She has not yet hit one year of no period but we discussed the importance of that milestone.     Orders Placed This Encounter  Procedures   MM 3D SCREENING MAMMOGRAM BILATERAL BREAST   FSH    Meds:  Meds ordered this encounter  Medications   medroxyPROGESTERone (PROVERA) 10 MG tablet    Sig: Take 1 tablet (10 mg total) by mouth daily.    Dispense:  90 tablet    Refill:  3    Follow-up: No follow-ups on file.  Milas Hock, MD 06/03/2023 10:08 AM

## 2023-06-04 LAB — SURGICAL PATHOLOGY

## 2023-06-05 LAB — CYTOLOGY - PAP
Comment: NEGATIVE
Diagnosis: NEGATIVE
High risk HPV: NEGATIVE

## 2023-06-11 NOTE — Progress Notes (Unsigned)
.smr  Office: [phone]  /  Fax: [phone]  WEIGHT SUMMARY AND BIOMETRICS  Vitals Temp: 99.8 F (37.7 C) BP: 128/81 Pulse Rate: 89 SpO2: 100 %   Anthropometric Measurements Height: 5\' 10"  (1.778 m) Weight: 278 lb (126.1 kg) BMI (Calculated): 39.89 Weight at Last Visit: 280 lb Weight Lost Since Last Visit: 2 lb Weight Gained Since Last Visit: 0 Starting Weight: 325 lb Total Weight Loss (lbs): 47 lb (21.3 kg) Peak Weight: 330 lb   Body Composition  Body Fat %: 42.4 % Fat Mass (lbs): 118.2 lbs Muscle Mass (lbs): 152.4 lbs Total Body Water (lbs): 112.6 lbs Visceral Fat Rating : 13   Other Clinical Data Fasting: no Labs: no Today's Visit #: 98 Starting Date: 05/23/17     HPI  Chief Complaint: OBESITY  Brandi Lester is here to discuss her progress with her obesity treatment plan. She is on the the Category 4 Plan and states she is following her eating plan approximately 80 % of the time. She states she is exercising walking/exercise bike 45 minutes 4 times per week.  Discussed the use of AI scribe software for clinical note transcription with the patient, who gave verbal consent to proceed.  History of Present Illness     Interval History:  Since last office visit she is down 2 lbs.  Down 47 lbs overall TBW loss of 14.5%  Brandi Lester who has a history of type two diabetes, hypertension, and vitamin D deficiency.  She reports struggles with insurance coverage for continuous glucose monitoring, despite its potential benefits for her diabetes management as she is not on insulin.   She recently acquired a CPAP machine, which she has used intermittently due to headaches.  She also mentions an upcoming uterine biopsy due to ongoing menstruation and the presence of uterine fibroids, due to concerns for continued uterine bleeding in her mid fifties.  She expresses concern about potential surgery and its impact on her work and exercise routine. The patient also reports  constipation, which she attributes to [facility]. She has resumed taking metformin twice daily and reports improvement in constipation with twice daily dosing.   Pharmacotherapy:  [name] 12.5 mg weekly. Denies mass in neck, dysphagia, dyspepsia, persistent hoarseness, abdominal pain, or V/Constipation or diarrhea. Has annual eye exam. Mood is stable. Some nausea for first day or so after injection. Constipation was improved with increasing metformin to BID.    Metformin twice daily. Feels twice daily metformin helps with bowel regularity. No other side effects with metformin.   TREATMENT PLAN FOR OBESITY: Obesity Continued progress with weight loss and increased muscle mass. Discussed the benefits of a weighted vest for enhancing walking exercise. -Continue current exercise regimen and consider adding a weighted vest. Recommended Dietary Goals  Brandi Lester is currently in the action stage of change. As such, her goal is to continue weight management plan. She has agreed to the Category 4 Plan.  Behavioral Intervention  We discussed the following Behavioral Modification Strategies today: continue to work on maintaining a reduced calorie state, getting the recommended amount of protein, incorporating whole foods, making healthy choices, staying well hydrated and practicing mindfulness when eating..  Additional resources provided today: NA  Recommended Physical Activity Goals  Brandi Lester has been advised to work up to 150 minutes of moderate intensity aerobic activity a week and strengthening exercises 2-3 times per week for cardiovascular health, weight loss maintenance and preservation of muscle mass.   She has agreed to Continue current level of physical activity  and  Increase physical activity in their day and reduce sedentary time (increase NEAT).   Pharmacotherapy We discussed various medication options to help Brandi Lester with her weight loss efforts and we both agreed to continue [name] 12.5  mg weekly and metformin 500 mg twice daily for Type 2 diabetes management.    Return in about 4 weeks (around 07/10/2023).[name] She was informed of the importance of frequent follow up visits to maximize her success with intensive lifestyle modifications for her multiple health conditions.  PHYSICAL EXAM:  Blood pressure 128/81, pulse 89, temperature 99.8 F (37.7 C), height 5\' 10"  (1.778 m), weight 278 lb (126.1 kg), SpO2 100%. Body mass index is 39.89 kg/m.  General: She is overweight, cooperative, alert, well developed, and in no acute distress. PSYCH: Has normal mood, affect and thought process.   Cardiovascular: HR 80's BP 128/81 Lungs: Normal breathing effort, no conversational dyspnea. Neuro: no focal deficits  DIAGNOSTIC DATA REVIEWED:  BMET    Component Value Date/Time   NA 142 02/19/2023 1452   NA 139 10/20/2020 0933   K 3.9 02/19/2023 1452   CL 106 02/19/2023 1452   CO2 27 02/19/2023 1452   GLUCOSE 72 02/19/2023 1452   BUN 13 02/19/2023 1452   BUN 8 10/20/2020 0933   CREATININE 0.73 02/19/2023 1452   CREATININE 0.79 08/10/2022 1503   CALCIUM 9.4 02/19/2023 1452   GFRNONAA 103 10/20/2020 0933   GFRAA 119 10/20/2020 0933   Lab Results  Component Value Date   HGBA1C 5.4 02/19/2023   HGBA1C 6.0 10/14/2008   Lab Results  Component Value Date   INSULIN 20.7 12/01/2019   INSULIN 31.1 (H) 05/23/2017   Lab Results  Component Value Date   TSH 1.49 02/19/2023   CBC    Component Value Date/Time   WBC 5.7 02/19/2023 1452   RBC 4.27 02/19/2023 1452   HGB 12.3 02/19/2023 1452   HGB 12.3 07/07/2019 0900   HCT 37.4 02/19/2023 1452   HCT 37.3 07/07/2019 0900   PLT 285.0 02/19/2023 1452   PLT 304 07/07/2019 0900   MCV 87.6 02/19/2023 1452   MCV 84 07/07/2019 0900   MCH 28.3 11/28/2020 1358   MCHC 32.8 02/19/2023 1452   RDW 14.4 02/19/2023 1452   RDW 14.2 07/07/2019 0900   Iron Studies No results found for: "IRON", "TIBC", "FERRITIN", "IRONPCTSAT" Lipid Panel      Component Value Date/Time   CHOL 114 02/19/2023 1452   CHOL 129 07/07/2019 0900   TRIG 104.0 02/19/2023 1452   HDL 38.40 (L) 02/19/2023 1452   HDL 33 (L) 07/07/2019 0900   CHOLHDL 3 02/19/2023 1452   VLDL 20.8 02/19/2023 1452   LDLCALC 55 02/19/2023 1452   LDLCALC 64 11/28/2020 1358   Hepatic Function Panel     Component Value Date/Time   PROT 7.3 02/19/2023 1452   PROT 7.0 12/01/2019 0941   ALBUMIN 4.2 02/19/2023 1452   ALBUMIN 4.2 12/01/2019 0941   AST 14 02/19/2023 1452   ALT 23 02/19/2023 1452   ALKPHOS 71 02/19/2023 1452   BILITOT 0.3 02/19/2023 1452   BILITOT 0.3 12/01/2019 0941   BILIDIR 0.0 06/09/2014 0843   IBILI 0.1 (L) 02/03/2014 1648      Component Value Date/Time   TSH 1.49 02/19/2023 1452   Nutritional Lab Results  Component Value Date   VD25OH 88.50 02/19/2023   VD25OH 77.64 10/22/2022   VD25OH 68.76 06/18/2022    ASSOCIATED CONDITIONS ADDRESSED TODAY  ASSESSMENT AND PLAN  Problem List Items Addressed  This Visit     Vitamin D deficiency   Depression   Relevant Medications   buPROPion (WELLBUTRIN SR) 200 MG 12 hr tablet   Hypertension associated with type 2 diabetes mellitus (HCC)   Relevant Medications   tirzepatide ([name]) 12.5 MG/0.5ML Pen   amLODipine (NORVASC) 5 MG tablet   Morbid obesity (HCC)   Relevant Medications   tirzepatide ([name]) 12.5 MG/0.5ML Pen   Diabetes mellitus (HCC) - Primary   Relevant Medications   ondansetron (ZOFRAN-ODT) 4 MG disintegrating tablet   tirzepatide ([name]) 12.5 MG/0.5ML Pen   BMI 39.0-39.9,adult Current BMI 39.8   Type 2 Diabetes Mellitus with other specified complication, without long-term current use of insulin HgbA1c is at goal. Last A1c was 5.4  Would like to get CGM for immediate feedback/increasing mindful eating, but unable to obtain as insurance will not cover with not being on insulin.  Medication(s): [name] 12.5 mg SQ weekly and Metformin 500 mg twice daily with meals She is  working  on nutrition plan to decrease simple carbohydrates, increase lean proteins and exercise to promote weight loss and improve glycemic control .  Lab Results  Component Value Date   HGBA1C 5.4 02/19/2023   HGBA1C 5.5 10/22/2022   HGBA1C 5.7 06/18/2022   Lab Results  Component Value Date   MICROALBUR 3.7 (H) 06/18/2022   LDLCALC 55 02/19/2023   CREATININE 0.73 02/19/2023   Lab Results  Component Value Date   GFR 93.01 02/19/2023   GFR 98.04 10/22/2022   GFR 101.59 06/18/2022    Plan: Continue and refill [name] 12.5 mg SQ weekly and continue metformin 500 mg BID.  No refill for metformin needed.  Continue working  on nutrition plan to decrease simple carbohydrates, increase lean proteins and exercise to promote weight loss and improve glycemic control .   Hypertension Hypertension improved, asymptomatic, and no significant medication side effects noted.  Medication(s): amlodipine 5 mg daily                         Carvedilol 25 mg daily                         Hydrochlorothiazide 12.5 mg daily                         Losartan 100 mg daily  BP Readings from Last 3 Encounters:  06/12/23 128/81  06/03/23 131/81  05/27/23 128/80   Lab Results  Component Value Date   CREATININE 0.73 02/19/2023   CREATININE 0.70 10/22/2022   CREATININE 0.79 08/10/2022   Lab Results  Component Value Date   GFR 93.01 02/19/2023   GFR 98.04 10/22/2022   GFR 101.59 06/18/2022    Plan: Continue all antihypertensives at current dosages. Refill amlodipine  Continue to work on nutrition plan to promote weight loss and improve BP control.   Vitamin D Deficiency Vitamin D is at goal of 50.  Most recent vitamin D level was 88.5. She is on OTC vitamin D3 1000 IU daily. Lab Results  Component Value Date   VD25OH 88.50 02/19/2023   VD25OH 77.64 10/22/2022   VD25OH 68.76 06/18/2022    Plan: Continue OTC vitamin D3 1000 IU daily  Intensive lifestyle modifications are the first  line treatment for this issue. We discussed several lifestyle modifications today and she will continue to work on diet, exercise and weight loss efforts. We will continue to  monitor. Orders and follow up as documented in patient record.    Eating disorder/emotional eating Kaleia has had issues with stress/emotional eating. Currently this is moderately controlled. Overall mood is stable. Medication(s): Bupropion SR 200 mg daily in am  Plan: Continue and refill Bupropion SR 200 mg daily in am   Sleep Apnea Recently started using CPAP machine with some initial discomfort. Discussed the importance of consistent use for overall health. -Continue using CPAP machine nightly. Intensive lifestyle modifications are the first line treatment for this issue. We discussed several lifestyle modifications today and she will continue to work on diet, exercise and weight loss efforts. We will continue to monitor.   Continuous Glucose Monitoring Discussed the benefits and insurance coverage. Patient is not currently insulin-dependent, which is a requirement for coverage. Plan to revisit this topic when insurance changes in January. -No changes to current plan.  Uterine Fibroids Upcoming biopsy scheduled for October 23rd. Discussed potential for hysterectomy and recovery time. -Follow up after biopsy results.    Follow-up appointment scheduled for November 6th. ATTESTASTION STATEMENTS:  Reviewed by clinician on day of visit: allergies, medications, problem list, medical history, surgical history, family history, social history, and previous encounter notes.   I have personally spent 48 minutes total time today in preparation, patient care, nutritional counseling and documentation for this visit, including the following: review of clinical lab tests; review of medical tests/procedures/services.      Crimson Beer, PA-C

## 2023-06-12 ENCOUNTER — Ambulatory Visit (INDEPENDENT_AMBULATORY_CARE_PROVIDER_SITE_OTHER): Payer: BC Managed Care – PPO | Admitting: Physician Assistant

## 2023-06-12 ENCOUNTER — Other Ambulatory Visit (HOSPITAL_BASED_OUTPATIENT_CLINIC_OR_DEPARTMENT_OTHER): Payer: Self-pay

## 2023-06-12 ENCOUNTER — Encounter (INDEPENDENT_AMBULATORY_CARE_PROVIDER_SITE_OTHER): Payer: Self-pay | Admitting: Physician Assistant

## 2023-06-12 VITALS — BP 128/81 | HR 89 | Temp 99.8°F | Ht 70.0 in | Wt 278.0 lb

## 2023-06-12 DIAGNOSIS — F5089 Other specified eating disorder: Secondary | ICD-10-CM

## 2023-06-12 DIAGNOSIS — I152 Hypertension secondary to endocrine disorders: Secondary | ICD-10-CM

## 2023-06-12 DIAGNOSIS — Z6839 Body mass index (BMI) 39.0-39.9, adult: Secondary | ICD-10-CM

## 2023-06-12 DIAGNOSIS — E559 Vitamin D deficiency, unspecified: Secondary | ICD-10-CM

## 2023-06-12 DIAGNOSIS — E1159 Type 2 diabetes mellitus with other circulatory complications: Secondary | ICD-10-CM

## 2023-06-12 DIAGNOSIS — E1169 Type 2 diabetes mellitus with other specified complication: Secondary | ICD-10-CM | POA: Diagnosis not present

## 2023-06-12 DIAGNOSIS — Z7985 Long-term (current) use of injectable non-insulin antidiabetic drugs: Secondary | ICD-10-CM

## 2023-06-12 DIAGNOSIS — F3289 Other specified depressive episodes: Secondary | ICD-10-CM

## 2023-06-12 DIAGNOSIS — G4733 Obstructive sleep apnea (adult) (pediatric): Secondary | ICD-10-CM

## 2023-06-12 DIAGNOSIS — Z7984 Long term (current) use of oral hypoglycemic drugs: Secondary | ICD-10-CM

## 2023-06-12 MED ORDER — ONDANSETRON 4 MG PO TBDP
4.0000 mg | ORAL_TABLET | Freq: Three times a day (TID) | ORAL | 0 refills | Status: DC | PRN
Start: 2023-06-12 — End: 2023-07-10
  Filled 2023-06-12: qty 18, 6d supply, fill #0

## 2023-06-12 MED ORDER — AMLODIPINE BESYLATE 5 MG PO TABS
5.0000 mg | ORAL_TABLET | Freq: Every day | ORAL | 0 refills | Status: DC
  Filled 2023-06-12 – 2023-07-02 (×2): qty 90, 90d supply, fill #0

## 2023-06-12 MED ORDER — BUPROPION HCL ER (SR) 200 MG PO TB12
200.0000 mg | ORAL_TABLET | Freq: Every morning | ORAL | 0 refills | Status: DC
  Filled 2023-06-12: qty 90, 90d supply, fill #0

## 2023-06-12 MED ORDER — TIRZEPATIDE 12.5 MG/0.5ML ~~LOC~~ SOAJ
12.5000 mg | SUBCUTANEOUS | 0 refills | Status: DC
  Filled 2023-06-12: qty 2, 28d supply, fill #0

## 2023-06-14 ENCOUNTER — Encounter: Payer: Self-pay | Admitting: Obstetrics and Gynecology

## 2023-06-25 ENCOUNTER — Encounter: Payer: Self-pay | Admitting: Obstetrics and Gynecology

## 2023-06-26 ENCOUNTER — Encounter: Payer: Self-pay | Admitting: Obstetrics & Gynecology

## 2023-06-26 ENCOUNTER — Ambulatory Visit: Payer: BC Managed Care – PPO | Admitting: Obstetrics & Gynecology

## 2023-06-26 VITALS — BP 126/71 | HR 88 | Wt 279.0 lb

## 2023-06-26 DIAGNOSIS — D251 Intramural leiomyoma of uterus: Secondary | ICD-10-CM

## 2023-06-26 DIAGNOSIS — N939 Abnormal uterine and vaginal bleeding, unspecified: Secondary | ICD-10-CM | POA: Diagnosis not present

## 2023-06-26 NOTE — Patient Instructions (Signed)
Uterine Artery Embolization for Fibroids Uterine artery embolization is a procedure to shrink uterine fibroids. Uterine fibroids are abnormal growths of tissue (tumors) that can develop in the uterus and cause heavy menstrual bleeding and pain. This type of tumor is not cancerous (is benign). Fibroids can vary in size, shape, weight, and where they grow in the uterus. In this procedure, a small, thin tube (catheter) is used to inject tiny particle beads that block the blood supply to the fibroid. This causes the fibroid to shrink. Tell a health care provider about: Any allergies you have. All medicines you are taking, including vitamins, herbs, eye drops, creams, and over-the-counter medicines. Any problems you or family members have had with anesthetic medicines. Any blood disorders you have. Any surgeries you have had. Any medical conditions you have. Whether you are pregnant or may be pregnant. What are the risks? Generally, this is a safe procedure. However, problems may occur, including: Bleeding. Allergic reactions to medicines or dyes. Damage to nearby structures or organs. Infection, including blood infection. Injury to the uterus from decreased blood supply. Lack of menstrual periods. Other problems may occur, including: Death of tissue cells around your bladder or vulva. A hole that may develop between organs, or from an organ to the surface of your skin. Blood clot in the legs or lung. What happens before the procedure? Staying hydrated Follow instructions from your health care provider about hydration, which may include: Up to 2 hours before the procedure - you may continue to drink clear liquids, such as water, clear fruit juice, black coffee, and plain tea.  Eating and drinking restrictions Follow instructions from your health care provider about eating and drinking, which may include: 8 hours before the procedure - stop eating heavy meals or foods, such as meat, fried  foods, or fatty foods. 6 hours before the procedure - stop eating light meals or foods, such as toast or cereal. 6 hours before the procedure - stop drinking milk or drinks that contain milk. 2 hours before the procedure - stop drinking clear liquids. Medicines Ask your health care provider about: Changing or stopping your regular medicines. This is especially important if you are taking diabetes medicines or blood thinners. Taking over-the-counter medicines, vitamins, herbs, and supplements. Taking medicines such as aspirin and ibuprofen. These medicines can thin your blood. Do not take these medicines unless your health care provider tells you to take them. You may be given medicine to prevent nausea and vomiting. General instructions Do not use any products that contain nicotine or tobacco for at least 4 weeks before the procedure. These products include cigarettes, chewing tobacco, and vaping devices, such as e-cigarettes. If you need help quitting, ask your health care provider. Ask your health care provider: What steps will be taken to help prevent infection. These steps may include: Removing hair at the surgery site. Washing skin with a germ-killing soap. Taking antibiotic medicine. You will be asked to empty your bladder before the procedure. Plan to have a responsible adult take you home from the hospital or clinic. Plan to have a responsible adult care for you for the time you are told after you leave the hospital or clinic. This is important. What happens during the procedure?  An IV will be inserted into one of your veins. You will be given one or more of the following: A medicine to help you relax (sedative). A medicine to numb the area (local anesthetic). A small cut (incision) will be made in your  groin to find the main artery in your leg. A catheter will be inserted into the main artery in your leg and guided to your uterus. Images and X-rays will be taken while dye is  injected through the catheter. This shows the blood supply to your uterus and fibroids. Tiny particles, about the size of grains of sand, will be injected through the catheter and will lodge in tiny branches of the uterine artery. This will block the blood supply to the fibroids and cause them to shrink. Metal coils may also be used to help block the artery. The procedure will be repeated on the artery supplying blood to the other side of the uterus. The catheter will be removed and a bandage (dressing) will be placed over the incision. The procedure may vary among health care providers and hospitals. What can I expect after the procedure? Your blood pressure, heart rate, breathing rate, and blood oxygen level will be monitored until you leave the hospital or clinic. You will be given pain medicine as needed. You may be given medicine for nausea and vomiting as needed. If you were given a sedative during the procedure, it can affect you for several hours. Do not drive or operate machinery until your health care provider says it is safe. Summary Uterine artery embolization is a procedure to shrink uterine fibroids by blocking their blood supply. You may be given a medicine to help you relax (sedative) and a medicine to numb the area (local anesthetic) for the procedure. Tiny particles will be injected in the artery to your uterus to block blood flow and shrink the fibroid. After the procedure, you will be given medicine for pain and nausea as needed. If you were given a sedative during the procedure, it can affect you for several hours. Do not drive or operate machinery until your health care provider says it is safe. This information is not intended to replace advice given to you by your health care provider. Make sure you discuss any questions you have with your health care provider. Document Revised: 03/28/2020 Document Reviewed: 03/28/2020 Elsevier Patient Education  2024 [employer] Uterine  Artery Embolization for Fibroids, Care After The following information offers guidance on how to care for yourself after your procedure. Your health care provider may also give you more specific instructions. If you have problems or questions, contact your health care provider. What can I expect after the procedure? After the procedure, it is common to have: Pain or discomfort at the incision site. Cramps in the area between your hips (pelvis). Vaginal bleeding. Vaginal discharge. Nausea and vomiting. Follow these instructions at home: Medicines Take over-the-counter and prescription medicines only as told by your health care provider. If you were prescribed an antibiotic medicine, take it as told by your health care provider. Do not stop using the antibiotic even if you start to feel better. If you are taking blood thinners: Talk with your health care provider before you take any medicines that contain aspirin or NSAIDs, such as ibuprofen. These medicines increase your risk for dangerous bleeding. Take your medicine exactly as told, at the same time every day. Avoid activities that could cause injury or bruising, and follow instructions about how to prevent falls. Wear a medical alert bracelet or carry a card that lists what medicines you take. Ask your health care provider if the medicine prescribed to you: Requires you to avoid driving or using machinery. Can cause constipation. You may need to take these actions to  prevent or treat constipation: Drink enough fluid to keep your urine pale yellow. Take over-the-counter or prescription medicines. Eat foods that are high in fiber, such as beans, whole grains, and fresh fruits and vegetables. Limit foods that are high in fat and processed sugars, such as fried or sweet foods. Incision care  Follow instructions from your health care provider about how to take care of your incision. Make sure you: Wash your hands with soap and water for at  least 20 seconds before and after you change your bandage (dressing). If soap and water are not available, use hand sanitizer. Change your dressing as told by your health care provider. Leave stitches (sutures), skin glue, or adhesive strips in place. These skin closures may need to stay in place for 2 weeks or longer. If adhesive strip edges start to loosen and curl up, you may trim the loose edges. Do not remove adhesive strips completely unless your health care provider tells you to do that. Check your incision area every day for signs of infection. Check for: Redness, swelling, or pain. Fluid or blood. Warmth. Pus or a bad smell. Activity Do not lift anything that is heavier than 5 lb (2.3 kg), or the limit that you are told, until your health care provider says that it is safe. Return to your normal activities as told by your health care provider. Ask your health care provider what activities are safe for you. General instructions Do not use any products that contain nicotine or tobacco. These products include cigarettes, chewing tobacco, and vaping devices, such as e-cigarettes. These can delay incision healing. If you need help quitting, ask your health care provider. Do not take baths, swim, or use a hot tub until your health care provider approves. Ask your health care provider if you may take showers. You may only be allowed to take sponge baths. Do not have sex or put anything in your vagina until your health care provider says that this is safe. Do not drink alcohol until your health care provider says it is okay. Wear compression stockings as told by your health care provider. These stockings help to prevent blood clots and reduce swelling in your legs. Keep all follow-up visits. This is important. Contact a health care provider if: You have a fever. You have more redness, swelling, or pain around your incision. You have more fluid or blood coming from your incision site. Your  incision feels warm to the touch. You have pus or a bad smell coming from your incision. You have a rash. You have nausea, or you cannot eat or drink anything without vomiting. You have a vaginal discharge that is not getting lighter. Get help right away if: You have trouble breathing. You have chest pain. You have severe pain in your abdomen, and it does not get better with medicine. You have leg pain or leg swelling. You feel dizzy, or you faint. These symptoms may represent a serious problem that is an emergency. Do not wait to see if the symptoms will go away. Get medical help right away. Call your local emergency services (911 in the [place] Do not drive yourself to the hospital. Summary After the procedure, it is common to have cramps, or pain or discomfort at the incision site. You will be given pain medicine. Follow instructions from your health care provider about how to take care of your incision. Check your incision area every day for signs of infection. Take over-the-counter and prescription medicines only as  told by your health care provider. Contact your health care provider if you have symptoms of infection or other symptoms that do not get better with treatment. This information is not intended to replace advice given to you by your health care provider. Make sure you discuss any questions you have with your health care provider. Document Revised: 03/28/2020 Document Reviewed: 03/28/2020 Elsevier Patient Education  2024 [employer].

## 2023-06-26 NOTE — Progress Notes (Signed)
History:  57 y.o. G0P0000 here today for repeat endo bx. She had a endo bx 9/24 that showed insufficient cells.  Pt with enlarged uterus and persistent irreg menses. Her sisters had fibroids as well and the 2 that did not have hysterectomies, had menopause in their late 63's.  The following portions of the patient's history were reviewed and updated as appropriate: allergies, current medications, past family history, past medical history, past social history, past surgical history and problem list.  Review of Systems:  Pertinent items are noted in HPI.    Objective:  Physical Exam Weight 279 lb (126.6 kg), last menstrual period 06/13/2023.  CONSTITUTIONAL: Well-developed, well-nourished female in no acute distress.  HENT:  Normocephalic, atraumatic EYES: Conjunctivae and EOM are normal. No scleral icterus.  NECK: Normal range of motion SKIN: Skin is warm and dry. No rash noted. Not diaphoretic.No pallor. NEUROLGIC: Alert and oriented to person, place, and time. Normal coordination.   GYN procedure: The indications for endometrial biopsy were reviewed.   Risks of the biopsy including cramping, bleeding, infection, uterine perforation, inadequate specimen and need for additional procedures  were discussed. The patient states she understands and agrees to undergo procedure today. Consent was signed. Time out was performed. Urine HCG was negative. A sterile speculum was placed in the patient's vagina and the cervix was prepped with Betadine. A single-toothed tenaculum was placed on the anterior lip of the cervix to stabilize it. The 3 mm pipelle was introduced into the endometrial cavity without difficulty to a depth of 11cm, and a moderate very dark red blood. There did not appear to be a lot of tissue. However I am certain I was within the endometrium, the tissue obtained was sent to pathology. The instruments were removed from the patient's vagina. Minimal bleeding from the cervix was noted. The  patient tolerated the procedure well. Routine post-procedure instructions were given to the patient. The patient will follow up to review the results and for further management.    Labs and Imaging 04/27/2022 CLINICAL DATA:  Uterine fibroids, LMP 04/15/2022   EXAM: TRANSABDOMINAL AND TRANSVAGINAL ULTRASOUND OF PELVIS   TECHNIQUE: Both transabdominal and transvaginal ultrasound examinations of the pelvis were performed. Transabdominal technique was performed for global imaging of the pelvis including uterus, ovaries, adnexal regions, and pelvic cul-de-sac. It was necessary to proceed with endovaginal exam following the transabdominal exam to visualize the endometrium and ovaries.   COMPARISON:  None   FINDINGS: Uterus   Measurements: 14.2 x 10.2 x 12.8 cm = volume: 970 mL. Enlarged, anteverted. Multiple masses consistent with leiomyomata. Some of these are calcified and shadowing. Largest lesions measure 9.8 cm anterior uterus subserosal, 7.1 cm exophytic LEFT lateral, 6.7 cm anterior fundus intramural, and 5.4 cm fundal subserosal.   Endometrium   Obscured by multiple leiomyomata   Right ovary   Not visualized likely due to bowel loops and enlarged nodular uterus   Left ovary   Not visualized likely due to bowel loops and enlarged nodular uterus   Other findings   No free pelvic fluid or adnexal masses.   IMPRESSION: Significantly enlarged uterus containing multiple leiomyomata up to 9.8 cm diameter.   Nonvisualization of endometrial complex and ovaries.   05/28/2023 FINAL MICROSCOPIC DIAGNOSIS:   A. ENDOMETRIUM, BIOPSY:       Predominant endocervical glands with minute fragments of  superficial inactive endometrium.       Non-diagnostic of glandular hyperplasia, atypia or malignancy.     Assessment & Plan:  Diagnoses  and all orders for this visit:  Abnormal uterine bleeding -     FSH -     MR PELVIS W WO CONTRAST; Future -     Ambulatory referral to  Interventional Radiology  Fibroids, intramural -     Ambulatory referral to Interventional Radiology   I have discussed with pt the options if her endo bx is neg. She does not wish to have surgery. Discussed UFE. She is interested. Will order MRI and refer to interventional rad. Will f/u with pt re result of endo bx.   Zanyla Klebba L. Harraway-Smith, M.D., [facility]

## 2023-06-27 ENCOUNTER — Other Ambulatory Visit (HOSPITAL_COMMUNITY)
Admission: RE | Admit: 2023-06-27 | Discharge: 2023-06-27 | Disposition: A | Discharge status: Home / Self Care | Payer: BC Managed Care – PPO | Source: Ambulatory Visit | Attending: Obstetrics & Gynecology | Admitting: Obstetrics & Gynecology

## 2023-06-27 DIAGNOSIS — N939 Abnormal uterine and vaginal bleeding, unspecified: Secondary | ICD-10-CM | POA: Diagnosis present

## 2023-06-27 LAB — FOLLICLE STIMULATING HORMONE: FSH: 16.5 m[IU]/mL

## 2023-06-27 NOTE — Addendum Note (Signed)
Addended by: Lorelle Gibbs L on: 06/27/2023 08:03 AM   Modules accepted: Orders

## 2023-07-01 ENCOUNTER — Encounter: Payer: Self-pay | Admitting: Obstetrics & Gynecology

## 2023-07-02 ENCOUNTER — Other Ambulatory Visit: Payer: Self-pay | Admitting: Family Medicine

## 2023-07-02 ENCOUNTER — Other Ambulatory Visit (HOSPITAL_BASED_OUTPATIENT_CLINIC_OR_DEPARTMENT_OTHER): Payer: Self-pay

## 2023-07-02 DIAGNOSIS — E876 Hypokalemia: Secondary | ICD-10-CM

## 2023-07-02 DIAGNOSIS — I10 Essential (primary) hypertension: Secondary | ICD-10-CM

## 2023-07-02 DIAGNOSIS — T7840XA Allergy, unspecified, initial encounter: Secondary | ICD-10-CM

## 2023-07-02 MED ORDER — POTASSIUM CHLORIDE CRYS ER 20 MEQ PO TBCR
20.0000 meq | EXTENDED_RELEASE_TABLET | Freq: Three times a day (TID) | ORAL | 0 refills | Status: DC
  Filled 2023-07-02: qty 270, 90d supply, fill #0

## 2023-07-05 ENCOUNTER — Encounter: Payer: Self-pay | Admitting: Obstetrics & Gynecology

## 2023-07-06 ENCOUNTER — Encounter: Payer: Self-pay | Admitting: Obstetrics & Gynecology

## 2023-07-10 ENCOUNTER — Ambulatory Visit (INDEPENDENT_AMBULATORY_CARE_PROVIDER_SITE_OTHER): Payer: BC Managed Care – PPO | Admitting: Physician Assistant

## 2023-07-10 ENCOUNTER — Encounter (INDEPENDENT_AMBULATORY_CARE_PROVIDER_SITE_OTHER): Payer: Self-pay | Admitting: Physician Assistant

## 2023-07-10 ENCOUNTER — Other Ambulatory Visit (HOSPITAL_BASED_OUTPATIENT_CLINIC_OR_DEPARTMENT_OTHER): Payer: Self-pay

## 2023-07-10 VITALS — BP 110/69 | HR 83 | Temp 99.5°F | Ht 68.0 in | Wt 275.0 lb

## 2023-07-10 DIAGNOSIS — E1169 Type 2 diabetes mellitus with other specified complication: Secondary | ICD-10-CM | POA: Diagnosis not present

## 2023-07-10 DIAGNOSIS — E1159 Type 2 diabetes mellitus with other circulatory complications: Secondary | ICD-10-CM | POA: Diagnosis not present

## 2023-07-10 DIAGNOSIS — I152 Hypertension secondary to endocrine disorders: Secondary | ICD-10-CM

## 2023-07-10 DIAGNOSIS — R110 Nausea: Secondary | ICD-10-CM | POA: Diagnosis not present

## 2023-07-10 DIAGNOSIS — Z7985 Long-term (current) use of injectable non-insulin antidiabetic drugs: Secondary | ICD-10-CM

## 2023-07-10 DIAGNOSIS — Z6841 Body Mass Index (BMI) 40.0 and over, adult: Secondary | ICD-10-CM

## 2023-07-10 MED ORDER — TIRZEPATIDE 12.5 MG/0.5ML ~~LOC~~ SOAJ
12.5000 mg | SUBCUTANEOUS | 0 refills | Status: DC
  Filled 2023-07-10: qty 2, 28d supply, fill #0

## 2023-07-10 MED ORDER — ONDANSETRON 4 MG PO TBDP
4.0000 mg | ORAL_TABLET | Freq: Three times a day (TID) | ORAL | 0 refills | Status: DC | PRN
  Filled 2023-07-10: qty 18, 6d supply, fill #0

## 2023-07-10 NOTE — Assessment & Plan Note (Signed)
Has done well on [name] resumed in Jan. 2024  Total weight loss of 50 lbs.  TBW loss of 15.4%

## 2023-07-10 NOTE — Progress Notes (Signed)
SUBJECTIVE:  Chief Complaint: Obesity  Interim History: Discussed the use of AI scribe software for clinical note transcription with the patient, who gave verbal consent to proceed.  History of Present Illness   The patient, with a history of obesity, type 2 diabetes, hypertension, and hypercholesterolemia, presents for a follow-up visit regarding her obesity treatment plan. She reports a weight loss of 3 pounds and adherence to her diet. However, she has been struggling with her eating schedule, often skipping dinner, which she attributes to a lack of appetite. She also reports constipation when not taking metformin. She is currently on metformin, amlodipine, and hydrochlorothiazide. The patient also mentions an upcoming appointment for IUD replacement and expresses anxiety about the procedure.       Netanya is here to discuss her progress with her obesity treatment plan. She is on the Category 4 Plan and states she is following her eating plan approximately 75 % of the time. She states she is exercising 30-45 minutes 4 times per week.   OBJECTIVE: Visit Diagnoses: Problem List Items Addressed This Visit     Type 2 diabetes mellitus with obesity (HCC)    Has done well on [name] resumed in Jan. 2024  Total weight loss of 50 lbs.  TBW loss of 15.4%      Relevant Medications   tirzepatide ([name]) 12.5 MG/0.5ML Pen   Diabetes mellitus (HCC)   Relevant Medications   tirzepatide ([name]) 12.5 MG/0.5ML Pen   Other Visit Diagnoses     Nausea    -  Primary   Relevant Medications   ondansetron (ZOFRAN-ODT) 4 MG disintegrating tablet     Type 2 Diabetes Mellitus with other specified complication, without long-term current use of insulin HgbA1c is at goal. Last A1c was 5.4  Medication(s): [name] 12.5 mg SQ weekly Denies mass in neck, dysphagia, dyspepsia, persistent hoarseness, abdominal pain, or N/V/Constipation or diarrhea. Has annual eye exam. Mood is stable.  Some  nausea at times and taking Zofran with good result.  Lab Results  Component Value Date   HGBA1C 5.4 02/19/2023   HGBA1C 5.5 10/22/2022   HGBA1C 5.7 06/18/2022   Lab Results  Component Value Date   MICROALBUR 3.7 (H) 06/18/2022   LDLCALC 55 02/19/2023   CREATININE 0.73 02/19/2023   Lab Results  Component Value Date   GFR 93.01 02/19/2023   GFR 98.04 10/22/2022   GFR 101.59 06/18/2022    Plan: Continue and refill [name] 12.5 mg SQ weeklyRefill Zofran prn for occasional nausea and monitor closely.  Continue working  on nutrition plan to decrease simple carbohydrates, increase lean proteins and exercise to promote weight loss and improve glycemic control .  Hypertension Hypertension asymptomatic, reasonably well controlled, and no significant medication side effects noted.  Medication(s): Coreg 25 mg BID   Losartan 100 mg daily   Hydrochlorothiazide 12.5 mg daily  Decreased medications since starting [name] therapy  Renal function is normal.   BP Readings from Last 3 Encounters:  07/10/23 110/69  06/26/23 126/71  06/12/23 128/81   Lab Results  Component Value Date   CREATININE 0.73 02/19/2023   CREATININE 0.70 10/22/2022   CREATININE 0.79 08/10/2022   Lab Results  Component Value Date   GFR 93.01 02/19/2023   GFR 98.04 10/22/2022   GFR 101.59 06/18/2022    Plan: Continue all antihypertensives at current dosages. Continue to work on nutrition plan to promote weight loss and improve BP control.  No hypotension Continue to monitor closely with use of [facility]  as may need further adjustment/decrease of medications.    Expand All Collapse All    WEIGHT SUMMARY AND BIOMETRICS   Vitals Temp: 99.5 F (37.5 C) BP: 110/69 Pulse Rate: 83 SpO2: 100 %      Anthropometric Measurements Height: 5\' 8"  (1.775m) Changed height to reflect current measurement Weight: 275 lb (124.7 kg) BMI (Calculated): 41.8 Weight at Last Visit: 278 lb Weight Lost Since Last  Visit: 3 lb Weight Gained Since Last Visit: 0 Starting Weight: 325 lb Total Weight Loss (lbs): 50 lb (22.7 kg) Peak Weight: 330 lb  Body Composition  Body Fat %: 50.3 % Fat Mass (lbs): 138.6 lbs Muscle Mass (lbs): 130 lbs Total Body Water (lbs): 101.6 lbs Visceral Fat Rating : 16  Other Clinical Data Fasting: no Labs: no Today's Visit #: 99 Starting Date: 05/23/17   ASSESSMENT AND PLAN:  Diet: Charina is currently in the action stage of change. As such, her goal is to continue with weight loss efforts. She has agreed to Category 4 Plan.  Exercise: Hollee has been instructed to continue exercising as is for weight loss and overall health benefits.   Behavior Modification:  We discussed the following Behavioral Modification Strategies today: increasing lean protein intake, decreasing simple carbohydrates, increasing vegetables, increase H2O intake, increase high fiber foods, no skipping meals, and holiday eating strategies. We discussed various medication options to help Mairin with her weight loss efforts and we both agreed to continue [name] 12.5 mg weekly.  Return in about 4 weeks (around 08/07/2023).[name] She was informed of the importance of frequent follow up visits to maximize her success with intensive lifestyle modifications for her multiple health conditions.  Attestation Statements:   Reviewed by clinician on day of visit: allergies, medications, problem list, medical history, surgical history, family history, social history, and previous encounter notes.   Time spent on visit including pre-visit chart review and post-visit care and charting was 45 minutes.    Trayquan Kolakowski, PA-C

## 2023-07-11 ENCOUNTER — Ambulatory Visit
Admission: RE | Admit: 2023-07-11 | Discharge: 2023-07-11 | Disposition: A | Discharge status: Home / Self Care | Payer: BC Managed Care – PPO | Source: Ambulatory Visit | Attending: Obstetrics & Gynecology | Admitting: Obstetrics & Gynecology

## 2023-07-11 DIAGNOSIS — D251 Intramural leiomyoma of uterus: Secondary | ICD-10-CM

## 2023-07-11 DIAGNOSIS — N939 Abnormal uterine and vaginal bleeding, unspecified: Secondary | ICD-10-CM

## 2023-07-11 MED ORDER — GADOPICLENOL 0.5 MMOL/ML IV SOLN
10.0000 mL | Freq: Once | INTRAVENOUS | Status: AC | PRN
  Administered 2023-07-11: 10 mL via INTRAVENOUS

## 2023-07-15 LAB — SURGICAL PATHOLOGY

## 2023-07-25 ENCOUNTER — Ambulatory Visit: Payer: BC Managed Care – PPO | Admitting: Cardiology

## 2023-07-28 NOTE — Assessment & Plan Note (Signed)
Encouraged DASH or MIND diet, decrease po intake and increase exercise as tolerated. Needs 7-8 hours of sleep nightly. Avoid trans fats, eat small, frequent meals every 4-5 hours with lean proteins, complex carbs and healthy fats. Minimize simple carbs, high fat foods and processed foods 

## 2023-07-28 NOTE — Assessment & Plan Note (Signed)
Well controlled, no changes to meds. Encouraged heart healthy diet such as the DASH diet and exercise as tolerated.  °

## 2023-07-28 NOTE — Assessment & Plan Note (Signed)
hgba1c acceptable, minimize simple carbs. Increase exercise as tolerated. Continue current meds but increase strength of [name].

## 2023-07-28 NOTE — Assessment & Plan Note (Signed)
Supplement and monitor 

## 2023-07-28 NOTE — Assessment & Plan Note (Signed)
Encourage heart healthy diet such as MIND or DASH diet, increase exercise, avoid trans fats, simple carbohydrates and processed foods, consider a krill or fish or flaxseed oil cap daily. Tolerating Atorvastatin

## 2023-07-29 ENCOUNTER — Other Ambulatory Visit (HOSPITAL_BASED_OUTPATIENT_CLINIC_OR_DEPARTMENT_OTHER): Payer: Self-pay

## 2023-07-29 ENCOUNTER — Telehealth (INDEPENDENT_AMBULATORY_CARE_PROVIDER_SITE_OTHER): Payer: BC Managed Care – PPO | Admitting: Family Medicine

## 2023-07-29 ENCOUNTER — Other Ambulatory Visit: Payer: Self-pay

## 2023-07-29 ENCOUNTER — Encounter: Payer: Self-pay | Admitting: Family Medicine

## 2023-07-29 ENCOUNTER — Other Ambulatory Visit: Payer: Self-pay | Admitting: Cardiology

## 2023-07-29 VITALS — BP 128/91 | HR 89

## 2023-07-29 DIAGNOSIS — E559 Vitamin D deficiency, unspecified: Secondary | ICD-10-CM | POA: Diagnosis not present

## 2023-07-29 DIAGNOSIS — I152 Hypertension secondary to endocrine disorders: Secondary | ICD-10-CM

## 2023-07-29 DIAGNOSIS — Z6841 Body Mass Index (BMI) 40.0 and over, adult: Secondary | ICD-10-CM

## 2023-07-29 DIAGNOSIS — E1159 Type 2 diabetes mellitus with other circulatory complications: Secondary | ICD-10-CM | POA: Diagnosis not present

## 2023-07-29 DIAGNOSIS — E669 Obesity, unspecified: Secondary | ICD-10-CM

## 2023-07-29 DIAGNOSIS — E785 Hyperlipidemia, unspecified: Secondary | ICD-10-CM

## 2023-07-29 DIAGNOSIS — E1169 Type 2 diabetes mellitus with other specified complication: Secondary | ICD-10-CM | POA: Diagnosis not present

## 2023-07-29 DIAGNOSIS — G4733 Obstructive sleep apnea (adult) (pediatric): Secondary | ICD-10-CM

## 2023-07-29 MED ORDER — HYDROCHLOROTHIAZIDE 12.5 MG PO CAPS
12.5000 mg | ORAL_CAPSULE | Freq: Every day | ORAL | 1 refills | Status: DC
  Filled 2023-07-29: qty 90, 90d supply, fill #0
  Filled 2023-11-11: qty 90, 90d supply, fill #1

## 2023-07-29 MED ORDER — FREESTYLE LIBRE 3 PLUS SENSOR MISC
5 refills | Status: DC
  Filled 2023-07-29: qty 2, 30d supply, fill #0
  Filled 2023-09-12: qty 2, 30d supply, fill #1

## 2023-07-29 MED ORDER — FREESTYLE LIBRE 3 READER DEVI
0 refills | Status: DC
  Filled 2023-07-29: qty 1, 30d supply, fill #0
  Filled 2023-07-29: qty 1, 1d supply, fill #0

## 2023-07-29 MED ORDER — XHANCE 93 MCG/ACT NA EXHU
2.0000 | INHALANT_SUSPENSION | Freq: Two times a day (BID) | NASAL | 5 refills | Status: DC
  Filled 2023-07-29: qty 16, 30d supply, fill #0

## 2023-07-29 MED ORDER — CARVEDILOL 25 MG PO TABS
25.0000 mg | ORAL_TABLET | Freq: Two times a day (BID) | ORAL | 3 refills | Status: DC
  Filled 2023-07-29: qty 180, 90d supply, fill #0
  Filled 2023-10-28: qty 180, 90d supply, fill #1
  Filled 2024-01-22: qty 180, 90d supply, fill #2
  Filled 2024-04-21: qty 180, 90d supply, fill #3

## 2023-07-29 NOTE — Patient Instructions (Signed)
Arexvy, RSV, Respiratory Syncitial Virus Vaccine

## 2023-07-29 NOTE — Progress Notes (Signed)
MyChart Video Visit    Virtual Visit via Video Note   This patient is at least at moderate risk for complications without adequate follow up. This format is felt to be most appropriate for this patient at this time. Physical exam was limited by quality of the video and audio technology used for the visit. Juanetta, CMA was able to get the patient set up on a video visit.  Patient location: Home Patient and provider in visit Provider location: Office  I discussed the limitations of evaluation and management by telemedicine and the availability of in person appointments. The patient expressed understanding and agreed to proceed.  Visit Date: 07/29/2023  Today's healthcare provider: Danise Edge, MD     Subjective:    Patient ID: Brandi Lester, female    DOB: 01/24/1966, 57 y.o.   MRN: [id number]  No chief complaint on file.   HPI Discussed the use of AI scribe software for clinical note transcription with the patient, who gave verbal consent to proceed.  History of Present Illness   The patient, with a history of diabetes, hypertension, and sleep apnea, presents with concerns about recent gynecological findings. She underwent an endometrial biopsy due to continued menstruation at an advanced age, which raised concerns about potential cancer. The initial biopsy did not yield clear cells, prompting a second biopsy and a blood test. The patient also underwent an MRI to assess fibroids in the uterus. The patient is considering treatment options, including embolization of the fibroids or a hysterectomy, but is hesitant due to the recovery time and impact on work.  The patient also reports using a CPAP machine for sleep apnea, with an average of 27 episodes per hour. She started using the machine in October and is still adjusting to it. She expresses a desire to switch from a full face mask to a nasal mask.  The patient also discusses her diabetes management, specifically the  desire for a continuous glucose monitor. She reports that her healthcare team at the [facility] is considering reducing her metformin dosage, and she believes a continuous glucose monitor would help her manage her blood sugar levels more effectively.  The patient also requests a refill of hydrochlorothiazide for her hypertension and discusses her use of Xhance for allergies. She expresses satisfaction with the [facility] and requests a refill.  Finally, the patient discusses her vaccination status. She has received the pneumonia, tetanus, COVID, and shingles vaccines, and the doctor recommends considering the new RSV vaccine.        Past Medical History:  Diagnosis Date   ADD (attention deficit disorder)    Allergy    allergic rhinitis   Anemia 07/25/2013   Angio-edema    Anxiety    Arthritis    not dx'd   Back pain    Costochondritis 02/20/2015   Diabetes mellitus    Type II   previously 3 years ago - no longer on meds    Dyslipidemia 07/25/2013   Fungus infection 06/2012   Left great toe   GERD (gastroesophageal reflux disease)    Hypertension    IBS (irritable bowel syndrome)    Ingrown nail 06/2012   right foot next to the last toe   Insomnia 04/20/2013   Joint pain    Lactose intolerance    Leg edema    Obesity    Pedal edema 02/20/2015   Prediabetes    Preventative health care 09/25/2015   PVC (premature ventricular contraction)  Recurrent upper respiratory infection (URI)    Urticaria     Past Surgical History:  Procedure Laterality Date   HYSTEROSCOPY N/A 10/04/2015   Procedure: HYSTEROSCOPY with Removal IUD;  Surgeon: Waynard Reeds, MD;  Location: [facility];  Service: Gynecology;  Laterality: N/A;   lasik     eye surgery   WISDOM TOOTH EXTRACTION      Family History  Problem Relation Age of Onset   Allergic rhinitis Mother    Heart attack Mother 35   Hypertension Mother    Heart disease Mother    Obesity Mother    Cancer Father         prostate   Prostate cancer Father    Allergic rhinitis Sister    Heart attack Sister 67   Allergic rhinitis Sister    Allergic rhinitis Sister    Allergic rhinitis Maternal Grandmother    Hypertension Other    Colon cancer Neg Hx    Colon polyps Neg Hx    Esophageal cancer Neg Hx    Stomach cancer Neg Hx    Rectal cancer Neg Hx     Social History   Socioeconomic History   Marital status: Single    Spouse name: Not on file   Number of children: Not on file   Years of education: Not on file   Highest education level: Not on file  Occupational History   Occupation: Runner, broadcasting/film/video, Public librarian  Tobacco Use   Smoking status: Former    Current packs/day: 0.00    Average packs/day: 1 pack/day for 15.0 years (15.0 ttl pk-yrs)    Types: Cigarettes    Start date: 09/03/1986    Quit date: 09/03/2001    Years since quitting: 21.9    Passive exposure: Never   Smokeless tobacco: Never  Vaping Use   Vaping status: Never Used  Substance and Sexual Activity   Alcohol use: Yes    Comment: rare   Drug use: No   Sexual activity: Yes    Birth control/protection: None  Other Topics Concern   Not on file  Social History Narrative   Not on file   Social Determinants of Health   Financial Resource Strain: Not on file  Food Insecurity: Not on file  Transportation Needs: Not on file  Physical Activity: Not on file  Stress: Not on file  Social Connections: Not on file  Intimate Partner Violence: Not on file    Outpatient Medications Prior to Visit  Medication Sig Dispense Refill   acetaminophen (TYLENOL 8 HOUR) 650 MG CR tablet Take 1 tablet (650 mg total) every 8 (eight) hours as needed by mouth for pain.     amLODipine (NORVASC) 5 MG tablet Take 1 tablet (5 mg total) by mouth daily. 90 tablet 0   atorvastatin (LIPITOR) 10 MG tablet Take 1 tablet (10 mg total) by mouth daily. 90 tablet 3   buPROPion (WELLBUTRIN SR) 200 MG 12 hr tablet Take 1 tablet (200 mg total) by mouth in the  morning. 90 tablet 0   Carbinoxamine Maleate 4 MG TABS Take 1 tablet (4 mg total) by mouth 4 (four) times daily as needed. 28 tablet 0   cholecalciferol (VITAMIN D3) 25 MCG (1000 UT) tablet Take 4,000 Units by mouth daily.     EPINEPHrine (EPIPEN 2-PAK) 0.3 mg/0.3 mL IJ SOAJ injection Use as directed for severe allergic reaction 2 each 1   ibuprofen (ADVIL,MOTRIN) 600 MG tablet Take 1 tablet (600 mg total) by mouth every  6 (six) hours as needed. 90 tablet 0   losartan (COZAAR) 100 MG tablet Take 1 tablet (100 mg total) by mouth daily. 90 tablet 3   medroxyPROGESTERone (PROVERA) 10 MG tablet Take 1 tablet (10 mg total) by mouth daily. 90 tablet 3   metFORMIN (GLUCOPHAGE) 500 MG tablet Take 1 tablet (500 mg total) by mouth 2 (two) times daily with a meal. 180 tablet 0   ondansetron (ZOFRAN-ODT) 4 MG disintegrating tablet Dissolve 1 tablet (4 mg total) by mouth every 8 (eight) hours as needed for nausea or vomiting. 20 tablet 0   pantoprazole (PROTONIX) 40 MG tablet Take 1 tablet (40 mg total) by mouth daily. 90 tablet 0   potassium chloride SA (KLOR-CON M) 20 MEQ tablet Take 1 tablet (20 mEq total) by mouth 3 (three) times daily. 270 tablet 0   tirzepatide ([name]) 12.5 MG/0.5ML Pen Inject 12.5 mg into the skin once a week. 2 mL 0   carvedilol (COREG) 25 MG tablet Take 1 tablet (25 mg total) by mouth 2 (two) times daily. 180 tablet 3   Fluticasone Propionate (XHANCE) 93 MCG/ACT EXHU spray 2 sprays in each nostril twice a day for nasal congestion 16 mL 5   hydrochlorothiazide (MICROZIDE) 12.5 MG capsule Take 1 capsule (12.5 mg total) by mouth daily. 90 capsule 1   No facility-administered medications prior to visit.    Allergies  Allergen Reactions   Penicillins Itching and Swelling    Has patient had a PCN reaction causing immediate rash, facial/tongue/throat swelling, SOB or lightheadedness with hypotension: yes Has patient had a PCN reaction causing severe rash involving mucus membranes or  skin necrosis: no Has patient had a PCN reaction that required hospitalization no Has patient had a PCN reaction occurring within the last 10 years: yes If all of the above answers are "NO", then may proceed with Cephalosporin use.    Victoza [Liraglutide]     Vomiting    Review of Systems  Constitutional:  Negative for fever and malaise/fatigue.  HENT:  Negative for congestion.   Eyes:  Negative for blurred vision.  Respiratory:  Negative for shortness of breath.   Cardiovascular:  Negative for chest pain, palpitations and leg swelling.  Gastrointestinal:  Negative for abdominal pain, blood in stool and nausea.  Genitourinary:  Negative for dysuria and frequency.  Musculoskeletal:  Negative for falls.  Skin:  Negative for rash.  Neurological:  Negative for dizziness, loss of consciousness and headaches.  Endo/Heme/Allergies:  Negative for environmental allergies.  Psychiatric/Behavioral:  Negative for depression. The patient is not nervous/anxious.        Objective:    Physical Exam Constitutional:      General: She is not in acute distress.    Appearance: Normal appearance. She is well-developed. She is not toxic-appearing.  HENT:     Head: Normocephalic and atraumatic.     Right Ear: External ear normal.     Left Ear: External ear normal.     Nose: Nose normal.  Eyes:     General:        Right eye: No discharge.        Left eye: No discharge.     Conjunctiva/sclera: Conjunctivae normal.  Neck:     Thyroid: No thyromegaly.  Cardiovascular:     Rate and Rhythm: Normal rate and regular rhythm.     Heart sounds: Normal heart sounds. No murmur heard. Pulmonary:     Effort: Pulmonary effort is normal. No respiratory distress.  Breath sounds: Normal breath sounds.  Abdominal:     General: Bowel sounds are normal.     Palpations: Abdomen is soft.     Tenderness: There is no abdominal tenderness. There is no guarding.  Musculoskeletal:        General: Normal range  of motion.     Cervical back: Neck supple.  Lymphadenopathy:     Cervical: No cervical adenopathy.  Skin:    General: Skin is warm and dry.  Neurological:     Mental Status: She is alert and oriented to person, place, and time.  Psychiatric:        Mood and Affect: Mood normal.        Behavior: Behavior normal.        Thought Content: Thought content normal.        Judgment: Judgment normal.     BP (!) 128/91   Pulse 89  Wt Readings from Last 3 Encounters:  07/10/23 275 lb (124.7 kg)  06/26/23 279 lb (126.6 kg)  06/12/23 278 lb (126.1 kg)       Assessment & Plan:  BMI 40.0-44.9, adult (HCC) Current BMI 40.1 Assessment & Plan: Encouraged DASH or MIND diet, decrease po intake and increase exercise as tolerated. Needs 7-8 hours of sleep nightly. Avoid trans fats, eat small, frequent meals every 4-5 hours with lean proteins, complex carbs and healthy fats. Minimize simple carbs, high fat foods and processed foods    Hyperlipidemia associated with type 2 diabetes mellitus (HCC) Assessment & Plan: Encourage heart healthy diet such as MIND or DASH diet, increase exercise, avoid trans fats, simple carbohydrates and processed foods, consider a krill or fish or flaxseed oil cap daily. Tolerating Atorvastatin  Orders: -     Lipid panel; Future  Hypertension associated with type 2 diabetes mellitus (HCC) Assessment & Plan: Well controlled, no changes to meds. Encouraged heart healthy diet such as the DASH diet and exercise as tolerated.    Orders: -     Comprehensive metabolic panel; Future -     CBC with Differential/Platelet; Future -     TSH; Future  Vitamin D deficiency Assessment & Plan: Supplement and monitor   Orders: -     VITAMIN D 25 Hydroxy (Vit-D Deficiency, Fractures); Future  Type 2 diabetes mellitus with obesity (HCC) Assessment & Plan: hgba1c acceptable, minimize simple carbs. Increase exercise as tolerated. Continue current meds but increase strength of  [name].   Orders: -     Hemoglobin A1c; Future -     Insulin, random; Future -     Microalbumin / creatinine urine ratio; Future  OSA (obstructive sleep apnea) Assessment & Plan: Is adjusting to her CPAP but has been working with pulmonary to get a mask she tolerates it.    Other orders -     Xhance; Place 2 sprays into both nostrils 2 (two) times daily for nasal congestion.  Dispense: 16 mL; Refill: 5 -     hydroCHLOROthiazide; Take 1 capsule (12.5 mg total) by mouth daily.  Dispense: 90 capsule; Refill: 1 -     FreeStyle Libre 3 Reader; Use as directed to check sugar. Dx Code E11.9  Dispense: 1 each; Refill: 0 -     FreeStyle Libre 3 Plus Sensor; Change sensor every 15 days. Dx Code E11.9  Dispense: 2 each; Refill: 5     Assessment and Plan    Uterine Fibroids Multiple fibroids and bulky uterus noted on MRI. No cancer detected on endometrial biopsy.  Patient considering embolization or hysterectomy, but planning to delay until summer due to work commitments. 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 -Continue monitoring for changes in bleeding.  Obstructive Sleep Apnea Using CPAP machine since October, still adjusting to full face mask. Considering switching to nasal mask. -Continue using CPAP machine. -Discuss mask options at next sleep medicine appointment.  Type 2 Diabetes Well-controlled with Metformin. [facility] considering weaning off Metformin, suggesting use of continuous glucose monitor (CGM). -Submit another request for CGM to insurance. -If denied, consider reapplying in January when new formulary year begins.  Allergic Rhinitis Responding well to Xhance (fluticasone). -Refill Xhance prescription.  Hypertension On Hydrochlorothiazide. -Refill Hydrochlorothiazide prescription.  General Health Maintenance -Order labs (Vitamin D, kidney function, insulin, glucose, thyroid, metabolic panel, CBC, cholesterol,  microalbumin). -Plan for physical in June. -Consider RSV vaccination (Arexvy). -Continue current pneumonia vaccination schedule, consider booster at age 48.         I discussed the assessment and treatment plan with the patient. The patient was provided an opportunity to ask questions and all were answered. The patient agreed with the plan and demonstrated an understanding of the instructions.   The patient was advised to call back or seek an in-person evaluation if the symptoms worsen or if the condition fails to improve as anticipated.  Danise Edge, MD [facility] [phone] (phone) [phone] (fax)  [facility]

## 2023-07-29 NOTE — Assessment & Plan Note (Signed)
Is adjusting to her CPAP but has been working with pulmonary to get a mask she tolerates it.

## 2023-07-30 ENCOUNTER — Encounter: Payer: Self-pay | Admitting: Obstetrics & Gynecology

## 2023-07-30 ENCOUNTER — Other Ambulatory Visit: Payer: Self-pay | Admitting: Obstetrics & Gynecology

## 2023-07-30 ENCOUNTER — Other Ambulatory Visit (HOSPITAL_BASED_OUTPATIENT_CLINIC_OR_DEPARTMENT_OTHER): Payer: Self-pay

## 2023-07-30 DIAGNOSIS — D259 Leiomyoma of uterus, unspecified: Secondary | ICD-10-CM

## 2023-07-30 NOTE — Telephone Encounter (Signed)
Left voicemail for interventional radiology to get patient scheduled. Waiting for a return call to assist with getting her scheduled.

## 2023-08-05 ENCOUNTER — Other Ambulatory Visit (INDEPENDENT_AMBULATORY_CARE_PROVIDER_SITE_OTHER): Payer: BC Managed Care – PPO

## 2023-08-05 DIAGNOSIS — E1159 Type 2 diabetes mellitus with other circulatory complications: Secondary | ICD-10-CM | POA: Diagnosis not present

## 2023-08-05 DIAGNOSIS — E669 Obesity, unspecified: Secondary | ICD-10-CM | POA: Diagnosis not present

## 2023-08-05 DIAGNOSIS — I152 Hypertension secondary to endocrine disorders: Secondary | ICD-10-CM | POA: Diagnosis not present

## 2023-08-05 DIAGNOSIS — E785 Hyperlipidemia, unspecified: Secondary | ICD-10-CM | POA: Diagnosis not present

## 2023-08-05 DIAGNOSIS — E1169 Type 2 diabetes mellitus with other specified complication: Secondary | ICD-10-CM

## 2023-08-05 DIAGNOSIS — E559 Vitamin D deficiency, unspecified: Secondary | ICD-10-CM

## 2023-08-06 LAB — MICROALBUMIN / CREATININE URINE RATIO
Creatinine,U: 365.2 mg/dL
Microalb Creat Ratio: 1.3 mg/g (ref 0.0–30.0)
Microalb, Ur: 4.6 mg/dL — ABNORMAL HIGH (ref 0.0–1.9)

## 2023-08-06 LAB — COMPREHENSIVE METABOLIC PANEL
ALT: 18 U/L (ref 0–35)
AST: 15 U/L (ref 0–37)
Albumin: 4.3 g/dL (ref 3.5–5.2)
Alkaline Phosphatase: 83 U/L (ref 39–117)
BUN: 16 mg/dL (ref 6–23)
CO2: 28 meq/L (ref 19–32)
Calcium: 9.7 mg/dL (ref 8.4–10.5)
Chloride: 102 meq/L (ref 96–112)
Creatinine, Ser: 1.16 mg/dL (ref 0.40–1.20)
GFR: 53.18 mL/min — ABNORMAL LOW (ref >60.00)
Glucose, Bld: 77 mg/dL (ref 70–99)
Potassium: 4 meq/L (ref 3.5–5.1)
Sodium: 139 meq/L (ref 135–145)
Total Bilirubin: 0.4 mg/dL (ref 0.2–1.2)
Total Protein: 7.4 g/dL (ref 6.0–8.3)

## 2023-08-06 LAB — LIPID PANEL
Cholesterol: 132 mg/dL (ref 0–200)
HDL: 40.9 mg/dL (ref >39.00)
LDL Cholesterol: 74 mg/dL (ref 0–99)
NonHDL: 91.55
Total CHOL/HDL Ratio: 3
Triglycerides: 88 mg/dL (ref 0.0–149.0)
VLDL: 17.6 mg/dL (ref 0.0–40.0)

## 2023-08-06 LAB — CBC WITH DIFFERENTIAL/PLATELET
Basophils Absolute: 0.1 10*3/uL (ref 0.0–0.1)
Basophils Relative: 1 % (ref 0.0–3.0)
Eosinophils Absolute: 0.1 10*3/uL (ref 0.0–0.7)
Eosinophils Relative: 1.3 % (ref 0.0–5.0)
HCT: 39.1 % (ref 36.0–46.0)
Hemoglobin: 12.8 g/dL (ref 12.0–15.0)
Lymphocytes Relative: 27.9 % (ref 12.0–46.0)
Lymphs Abs: 1.7 10*3/uL (ref 0.7–4.0)
MCHC: 32.8 g/dL (ref 30.0–36.0)
MCV: 87 fL (ref 78.0–100.0)
Monocytes Absolute: 0.6 10*3/uL (ref 0.1–1.0)
Monocytes Relative: 10.1 % (ref 3.0–12.0)
Neutro Abs: 3.5 10*3/uL (ref 1.4–7.7)
Neutrophils Relative %: 59.7 % (ref 43.0–77.0)
Platelets: 298 10*3/uL (ref 150.0–400.0)
RBC: 4.5 Mil/uL (ref 3.87–5.11)
RDW: 14.2 % (ref 11.5–15.5)
WBC: 5.9 10*3/uL (ref 4.0–10.5)

## 2023-08-06 LAB — TSH: TSH: 1.44 u[IU]/mL (ref 0.35–5.50)

## 2023-08-06 LAB — HEMOGLOBIN A1C: Hgb A1c MFr Bld: 5.6 % (ref 4.6–6.5)

## 2023-08-06 LAB — VITAMIN D 25 HYDROXY (VIT D DEFICIENCY, FRACTURES): VITD: 74.02 ng/mL (ref 30.00–100.00)

## 2023-08-06 LAB — INSULIN, RANDOM: Insulin: 19.8 u[IU]/mL — ABNORMAL HIGH

## 2023-08-07 ENCOUNTER — Other Ambulatory Visit: Payer: Self-pay | Admitting: Emergency Medicine

## 2023-08-07 ENCOUNTER — Encounter (INDEPENDENT_AMBULATORY_CARE_PROVIDER_SITE_OTHER): Payer: Self-pay | Admitting: Physician Assistant

## 2023-08-07 ENCOUNTER — Other Ambulatory Visit (HOSPITAL_BASED_OUTPATIENT_CLINIC_OR_DEPARTMENT_OTHER): Payer: Self-pay

## 2023-08-07 ENCOUNTER — Ambulatory Visit (INDEPENDENT_AMBULATORY_CARE_PROVIDER_SITE_OTHER): Payer: BC Managed Care – PPO | Admitting: Physician Assistant

## 2023-08-07 VITALS — BP 124/71 | HR 86 | Temp 98.9°F | Ht 68.0 in | Wt 274.0 lb

## 2023-08-07 DIAGNOSIS — E559 Vitamin D deficiency, unspecified: Secondary | ICD-10-CM

## 2023-08-07 DIAGNOSIS — E1159 Type 2 diabetes mellitus with other circulatory complications: Secondary | ICD-10-CM

## 2023-08-07 DIAGNOSIS — I152 Hypertension secondary to endocrine disorders: Secondary | ICD-10-CM

## 2023-08-07 DIAGNOSIS — Z6841 Body Mass Index (BMI) 40.0 and over, adult: Secondary | ICD-10-CM

## 2023-08-07 DIAGNOSIS — F3289 Other specified depressive episodes: Secondary | ICD-10-CM

## 2023-08-07 DIAGNOSIS — F5089 Other specified eating disorder: Secondary | ICD-10-CM | POA: Diagnosis not present

## 2023-08-07 DIAGNOSIS — E1169 Type 2 diabetes mellitus with other specified complication: Secondary | ICD-10-CM

## 2023-08-07 DIAGNOSIS — E669 Obesity, unspecified: Secondary | ICD-10-CM | POA: Diagnosis not present

## 2023-08-07 DIAGNOSIS — Z7985 Long-term (current) use of injectable non-insulin antidiabetic drugs: Secondary | ICD-10-CM

## 2023-08-07 DIAGNOSIS — Z7984 Long term (current) use of oral hypoglycemic drugs: Secondary | ICD-10-CM

## 2023-08-07 MED ORDER — AMLODIPINE BESYLATE 5 MG PO TABS
5.0000 mg | ORAL_TABLET | Freq: Every day | ORAL | 0 refills | Status: DC
  Filled 2023-08-07: qty 90, 90d supply, fill #0

## 2023-08-07 MED ORDER — METFORMIN HCL 500 MG PO TABS
500.0000 mg | ORAL_TABLET | Freq: Two times a day (BID) | ORAL | 0 refills | Status: DC
  Filled 2023-08-07: qty 180, 90d supply, fill #0

## 2023-08-07 MED ORDER — BUPROPION HCL ER (SR) 200 MG PO TB12
200.0000 mg | ORAL_TABLET | Freq: Every morning | ORAL | 0 refills | Status: DC
  Filled 2023-08-07 – 2023-09-12 (×2): qty 90, 90d supply, fill #0

## 2023-08-07 MED ORDER — TIRZEPATIDE 12.5 MG/0.5ML ~~LOC~~ SOAJ
12.5000 mg | SUBCUTANEOUS | 0 refills | Status: DC
  Filled 2023-08-07: qty 2, 28d supply, fill #0

## 2023-08-07 NOTE — Progress Notes (Unsigned)
SUBJECTIVE:  Chief Complaint: Obesity  Interim History: She is down 1 lbs since her last visit.  Down 51 lbs overall TBW loss of 15.7% Brandi Lester, a 57 year old individual with a history of type 2 diabetes, hypertension, and hypercholesterolemia, presents for a follow-up of her obesity treatment plan. She has been on [name] 12.5 mg weekly and metformin 500 mg bid for diabetes management, carvedilol 25 mg twice daily, amlodipine 5 mg daily, hydrochlorothiazide 12.5 mg daily, and losartan 100 mg daily for blood pressure control, and Lipitor 10 mg daily for hypercholesterolemia.  Recently, she started using a Freestyle Libre 3+ continuous glucose monitoring device. However, she has been experiencing issues with the device, which frequently alarms indicating low glucose levels. She has been cross-checking these readings with finger stick glucose measurements, which consistently show higher values. This discrepancy has been causing distress and sleep disturbances. We discussed discontinuing the device if a new device does not provide improvement in alarm rate.  She does not have any symptoms consistent with hypoglycemia.   She has not reported any side effects from her current medications, including [name] and metformin.  Despite these challenges, her recent lab results show an A1C of 5.6, indicating good control of her diabetes. However, there has been a slight decrease in her kidney function, which will be rechecked in a month.  She has been maintaining her exercise routine, but anticipates that the upcoming holiday season may disrupt her schedule. She plans to implement strategies to manage her diet during holiday parties and gatherings.  Brandi Lester is here to discuss her progress with her obesity treatment plan. She is on the Category 4 Plan and states she is following her eating plan approximately 75 % of the time. She states she is exercising walking/exercise bike 35-40 minutes 3-4 times  per week.   OBJECTIVE: Visit Diagnoses: Problem List Items Addressed This Visit     Depression   Relevant Medications   buPROPion (WELLBUTRIN SR) 200 MG 12 hr tablet   Hypertension associated with type 2 diabetes mellitus (HCC)   Relevant Medications   amLODipine (NORVASC) 5 MG tablet   metFORMIN (GLUCOPHAGE) 500 MG tablet   tirzepatide ([name]) 12.5 MG/0.5ML Pen   Diabetes mellitus (HCC) - Primary   Relevant Medications   metFORMIN (GLUCOPHAGE) 500 MG tablet   tirzepatide ([name]) 12.5 MG/0.5ML Pen   BMI 40.0-44.9, adult (HCC) Current BMI 40.1   Relevant Medications   metFORMIN (GLUCOPHAGE) 500 MG tablet   tirzepatide ([name]) 12.5 MG/0.5ML Pen   Obesity (HCC)- Start BMI 47.99   Relevant Medications   metFORMIN (GLUCOPHAGE) 500 MG tablet   tirzepatide ([name]) 12.5 MG/0.5ML Pen  Obesity Follow-up for obesity management. Currently on [name] 12.5 mg weekly with adipose tissue loss and muscle mass gain. No reported side effects. Discussed dietary strategies for holidays to maintain weight and health. - Continue [name] 12.5 mg weekly - Monitor weight and body composition - Advise preloading with protein before parties - Encourage balanced meals with half plate protein - Continue current exercise regimen, supplement if holiday schedule impacts activity  Type 2 Diabetes Mellitus Lab Results  Component Value Date   HGBA1C 5.6 08/05/2023   HGBA1C 5.4 02/19/2023   HGBA1C 5.5 10/22/2022   Lab Results  Component Value Date   MICROALBUR 4.6 (H) 08/05/2023   LDLCALC 74 08/05/2023   CREATININE 1.16 08/05/2023    Well-controlled with [name] and metformin. A1c is 5.6, nicely optimized,  but insulin resistance persists with insulin level of 19.4. Issues with  Freestyle Libre 3+ CGM showing inaccurate low readings, causing distress and sleep disturbances. Discussed potential CGM replacement and consulting pharmacist for accurate options. Consider temporary discontinuation  of metformin if kidney function does not improve. Continue/refill [name] 12.5 mg weekly - Continue/refill metformin 500 mg bid - Consider replacing current CGM or consult pharmacist for potential replacement - Monitor blood glucose with finger sticks - Recheck kidney function in one month per her PCP  Hypertension Well-controlled with current medications. Blood pressure today was 124/71 mmHg. - Continue carvedilol 25 mg bid - Continue/refill amlodipine 5 mg daily - Continue hydrochlorothiazide 12.5 mg daily - Continue losartan 100 mg daily Continue to work on nutrition plan to promote weight loss and improve BP control.  May be able to decrease medications further with continued weight loss.  Monitor renal function closely.   Mood disorder/emotional eating Tanai has had issues with stress/emotional eating. Currently this is well controlled. Overall mood is stable. Medication(s): Bupropion SR 200 mg daily in am  Plan: Continue and refill Bupropion SR 200 mg daily in am Continue strategies for emotional eating behaviors/holiday strategies   Hypercholesterolemia Managed with Lipitor. No new issues reported. - Continue Lipitor 10 mg daily  Follow-up - Follow-up appointment on January 9th at 3:30 PM - Contact via MyChart if any issues arise before the next appointment.  Vitals Temp: 98.9 F (37.2 C) BP: 124/71 Pulse Rate: 86 SpO2: 99 %   Anthropometric Measurements Height: 5\' 8"  (1.727 m) Weight: 274 lb (124.3 kg) BMI (Calculated): 41.67 Weight at Last Visit: 275 lb Weight Lost Since Last Visit: 1 lb Weight Gained Since Last Visit: 0 Starting Weight: 325 lb Total Weight Loss (lbs): 51 lb (23.1 kg) Peak Weight: 330 lb   Body Composition  Body Fat %: 49.5 % Fat Mass (lbs): 135.8 lbs Muscle Mass (lbs): 131.6 lbs Total Body Water (lbs): 98.8 lbs Visceral Fat Rating : 16   Other Clinical Data Fasting: no Labs: no Today's Visit #: 100 Starting Date:  05/23/17     ASSESSMENT AND PLAN:  Diet: Stevey is currently in the action stage of change. As such, her goal is to continue with weight loss efforts. She has agreed to Category 4 Plan.  Exercise: Jashanti has been instructed to work up to a goal of 150 minutes of combined cardio and strengthening exercise per week for weight loss and overall health benefits.   Behavior Modification:  We discussed the following Behavioral Modification Strategies today: increasing lean protein intake, decreasing simple carbohydrates, increasing vegetables, increase H2O intake, increase high fiber foods, no skipping meals, holiday eating strategies, and planning for success. We discussed various medication options to help Raigen with her weight loss efforts and we both agreed to continue [name] 12.5 mg weekly and metformin 500 mg twice daily for Type 2 diabetes management and Wellbutrin 200 mg daily for emotional eating/cravings.  Return in about 5 weeks (around 09/11/2023).[name] She was informed of the importance of frequent follow up visits to maximize her success with intensive lifestyle modifications for her multiple health conditions.  Attestation Statements:   Reviewed by clinician on day of visit: allergies, medications, problem list, medical history, surgical history, family history, social history, and previous encounter notes.   Time spent on visit including pre-visit chart review and post-visit care and charting was 32 minutes.    Allien Melberg, PA-C

## 2023-08-11 ENCOUNTER — Encounter: Payer: Self-pay | Admitting: Family Medicine

## 2023-08-16 ENCOUNTER — Ambulatory Visit: Payer: BC Managed Care – PPO | Attending: Cardiology | Admitting: Cardiology

## 2023-08-16 ENCOUNTER — Encounter: Payer: Self-pay | Admitting: Cardiology

## 2023-08-16 VITALS — BP 144/86 | HR 84 | Ht 70.0 in | Wt 281.0 lb

## 2023-08-16 DIAGNOSIS — I491 Atrial premature depolarization: Secondary | ICD-10-CM

## 2023-08-16 DIAGNOSIS — I152 Hypertension secondary to endocrine disorders: Secondary | ICD-10-CM

## 2023-08-16 DIAGNOSIS — G4733 Obstructive sleep apnea (adult) (pediatric): Secondary | ICD-10-CM

## 2023-08-16 DIAGNOSIS — I493 Ventricular premature depolarization: Secondary | ICD-10-CM | POA: Diagnosis not present

## 2023-08-16 DIAGNOSIS — E1159 Type 2 diabetes mellitus with other circulatory complications: Secondary | ICD-10-CM

## 2023-08-16 NOTE — Progress Notes (Signed)
Cardiology Office Note:    Date:  08/16/2023   ID:  Brandi Lester, DOB Feb 26, 1966, MRN [id number]  PCP:  Bradd Canary, MD  Cardiologist:  Thomasene Ripple, DO  Electrophysiologist:  None   Referring MD: Bradd Canary, MD   " I am doing well"   History of Present Illness:    Brandi Lester is a 57 y.o. female with a hx of hypertension, hyperlipidemia, diabetes mellitus is here today for follow-up visit.  I initially on October 19, 2020 to be evaluated for chest pain and palpitations.  At that time I placed a monitor on the patient as well as order a coronary CT and echocardiogram.  I saw the patient on Jan 16, 2021 at that time we switched her blood pressure medications from metoprolol to carvedilol.  We discussed her testing results.  This is outpatient she has been following her healthy wellness program.  She notes that her blood pressure has improved significantly.  She does still feel symptoms from her PVCs.  But they are not many and not debilitating.  I saw the patient on July 17, 2021 at that time she was hypertensive but had not taken her medication.  Did not change any antihypertensive medication.  She is here today for follow-up visit.  She is happy she has started following up in healthy weight and wellness.  She was recently started on Ozempic.  She is recovering from a respiratory viral infection.    Since her visit with me she has been doing well.  She reports that she may need to undergo a hysterectomy in the summer due to the fibroids. The patient is currently considering either a hysterectomy or embolization of the fibroids. She is not in menopause and does not wish to have children. The patient also reports that she has recently started using a CPAP machine for sleep apnea, which has been going well. She has been able to use the machine for five to six hours a night and feels rested in the morning.   No other complaints at this time.    Past Medical  History:  Diagnosis Date   ADD (attention deficit disorder)    Allergy    allergic rhinitis   Anemia 07/25/2013   Angio-edema    Anxiety    Arthritis    not dx'd   Back pain    Costochondritis 02/20/2015   Diabetes mellitus    Type II   previously 3 years ago - no longer on meds    Dyslipidemia 07/25/2013   Fungus infection 06/2012   Left great toe   GERD (gastroesophageal reflux disease)    Hypertension    IBS (irritable bowel syndrome)    Ingrown nail 06/2012   right foot next to the last toe   Insomnia 04/20/2013   Joint pain    Lactose intolerance    Leg edema    Obesity    Pedal edema 02/20/2015   Prediabetes    Preventative health care 09/25/2015   PVC (premature ventricular contraction)    Recurrent upper respiratory infection (URI)    Urticaria     Past Surgical History:  Procedure Laterality Date   HYSTEROSCOPY N/A 10/04/2015   Procedure: HYSTEROSCOPY with Removal IUD;  Surgeon: Waynard Reeds, MD;  Location: [facility];  Service: Gynecology;  Laterality: N/A;   lasik     eye surgery   WISDOM TOOTH EXTRACTION      Current Medications: Current Meds  Medication Sig  acetaminophen (TYLENOL 8 HOUR) 650 MG CR tablet Take 1 tablet (650 mg total) every 8 (eight) hours as needed by mouth for pain.   amLODipine (NORVASC) 5 MG tablet Take 1 tablet (5 mg total) by mouth daily.   atorvastatin (LIPITOR) 10 MG tablet Take 1 tablet (10 mg total) by mouth daily.   buPROPion (WELLBUTRIN SR) 200 MG 12 hr tablet Take 1 tablet (200 mg total) by mouth in the morning.   Carbinoxamine Maleate 4 MG TABS Take 1 tablet (4 mg total) by mouth 4 (four) times daily as needed.   carvedilol (COREG) 25 MG tablet Take 1 tablet (25 mg total) by mouth 2 (two) times daily.   cholecalciferol (VITAMIN D3) 25 MCG (1000 UT) tablet Take 4,000 Units by mouth daily.   Continuous Glucose Receiver (FREESTYLE LIBRE 3 READER) DEVI Use as directed to check sugar. Dx Code E11.9   Continuous Glucose Sensor  (FREESTYLE LIBRE 3 PLUS SENSOR) MISC Change sensor every 15 days. Dx Code E11.9   EPINEPHrine (EPIPEN 2-PAK) 0.3 mg/0.3 mL IJ SOAJ injection Use as directed for severe allergic reaction   Fluticasone Propionate (XHANCE) 93 MCG/ACT EXHU Place 2 sprays into both nostrils 2 (two) times daily for nasal congestion.   hydrochlorothiazide (MICROZIDE) 12.5 MG capsule Take 1 capsule (12.5 mg total) by mouth daily.   ibuprofen (ADVIL,MOTRIN) 600 MG tablet Take 1 tablet (600 mg total) by mouth every 6 (six) hours as needed.   losartan (COZAAR) 100 MG tablet Take 1 tablet (100 mg total) by mouth daily.   medroxyPROGESTERone (PROVERA) 10 MG tablet Take 1 tablet (10 mg total) by mouth daily.   metFORMIN (GLUCOPHAGE) 500 MG tablet Take 1 tablet (500 mg total) by mouth 2 (two) times daily with a meal.   ondansetron (ZOFRAN-ODT) 4 MG disintegrating tablet Dissolve 1 tablet (4 mg total) by mouth every 8 (eight) hours as needed for nausea or vomiting.   pantoprazole (PROTONIX) 40 MG tablet Take 1 tablet (40 mg total) by mouth daily.   potassium chloride SA (KLOR-CON M) 20 MEQ tablet Take 1 tablet (20 mEq total) by mouth 3 (three) times daily.   tirzepatide ([name]) 12.5 MG/0.5ML Pen Inject 12.5 mg into the skin once a week.     Allergies:   Penicillins and Victoza [liraglutide]   Social History   Socioeconomic History   Marital status: Single    Spouse name: Not on file   Number of children: Not on file   Years of education: Not on file   Highest education level: Not on file  Occupational History   Occupation: Runner, broadcasting/film/video, Public librarian  Tobacco Use   Smoking status: Former    Current packs/day: 0.00    Average packs/day: 1 pack/day for 15.0 years (15.0 ttl pk-yrs)    Types: Cigarettes    Start date: 09/03/1986    Quit date: 09/03/2001    Years since quitting: 21.9    Passive exposure: Never   Smokeless tobacco: Never  Vaping Use   Vaping status: Never Used  Substance and Sexual Activity   Alcohol use:  Yes    Comment: rare   Drug use: No   Sexual activity: Yes    Birth control/protection: None  Other Topics Concern   Not on file  Social History Narrative   Not on file   Social Drivers of Health   Financial Resource Strain: Not on file  Food Insecurity: Not on file  Transportation Needs: Not on file  Physical Activity: Not on file  Stress: Not on file  Social Connections: Not on file     Family History: The patient's family history includes Allergic rhinitis in her maternal grandmother, mother, sister, sister, and sister; Cancer in her father; Heart attack (age of onset: 100) in her sister; Heart attack (age of onset: 15) in her mother; Heart disease in her mother; Hypertension in her mother and another family member; Obesity in her mother; Prostate cancer in her father. There is no history of Colon cancer, Colon polyps, Esophageal cancer, Stomach cancer, or Rectal cancer.  ROS:   Review of Systems  Constitution: Negative for decreased appetite, fever and weight gain.  HENT: Negative for congestion, ear discharge, hoarse voice and sore throat.   Eyes: Negative for discharge, redness, vision loss in right eye and visual halos.  Cardiovascular: Negative for chest pain, dyspnea on exertion, leg swelling, orthopnea and palpitations.  Respiratory: Negative for cough, hemoptysis, shortness of breath and snoring.   Endocrine: Negative for heat intolerance and polyphagia.  Hematologic/Lymphatic: Negative for bleeding problem. Does not bruise/bleed easily.  Skin: Negative for flushing, nail changes, rash and suspicious lesions.  Musculoskeletal: Negative for arthritis, joint pain, muscle cramps, myalgias, neck pain and stiffness.  Gastrointestinal: Negative for abdominal pain, bowel incontinence, diarrhea and excessive appetite.  Genitourinary: Negative for decreased libido, genital sores and incomplete emptying.  Neurological: Negative for brief paralysis, focal weakness, headaches and  loss of balance.  Psychiatric/Behavioral: Negative for altered mental status, depression and suicidal ideas.  Allergic/Immunologic: Negative for HIV exposure and persistent infections.    EKGs/Labs/Other Studies Reviewed:    The following studies were reviewed today:   EKG: Sinus rhythm, heart rate 83 bpm compared to prior EKG no significant change.  CCTA 11/07/2020 Aorta: Normal size.  No calcifications.  No dissection.   Aortic Valve:  Trileaflet.  No calcifications.   Coronary Arteries:  Normal coronary origin.  Right dominance.   RCA is a large dominant artery that gives rise to PDA and PLVB. There is no plaque.   Left main is a large artery that gives rise to LAD and LCX arteries.   LAD is a large vessel that has no plaque.   LCX is a non-dominant artery that gives rise to one large OM1 branch. There is no plaque.   Other findings:   Normal pulmonary vein drainage into the left atrium.   Normal left atrial appendage without a thrombus.   Normal size of the pulmonary artery.   IMPRESSION: 1. Coronary calcium score of 0. This was 0 percentile for age and sex matched control.   2. Normal coronary origin with right dominance.   3. No evidence of CAD.   Thomasene Ripple, DO     Electronically Signed   By: Thomasene Ripple DO   On: 11/07/2020 17:04    Addended by Thomasene Ripple, DO on 11/07/2020  5:06 PM   Study Result  Narrative & Impression  EXAM: OVER-READ INTERPRETATION  CT CHEST   The following report is an over-read performed by radiologist Dr. Trudie Reed of [facility] on 11/07/2020. This over-read does not include interpretation of cardiac or coronary anatomy or pathology. The coronary calcium score/coronary CTA interpretation by the cardiologist is attached.   COMPARISON:  None.   FINDINGS: Within the visualized portions of the thorax there are no suspicious appearing pulmonary nodules or masses, there is no acute consolidative airspace  disease, no pleural effusions, no pneumothorax and no lymphadenopathy. Visualized portions of the upper abdomen are unremarkable. There  are no aggressive appearing lytic or blastic lesions noted in the visualized portions of the skeleton.   IMPRESSION: No significant incidental noncardiac findings are noted.   Electronically Signed: By: Trudie Reed M.D. On: 11/07/2020 10:53    Transthoracic echocardiogram November 14, 2020 IMPRESSIONS   1. Left ventricular ejection fraction, by estimation, is 60 to 65%. The  left ventricle has normal function. The left ventricle has no regional  wall motion abnormalities. Left ventricular diastolic parameters are  consistent with Grade I diastolic  dysfunction (impaired relaxation).   2. Right ventricular systolic function is normal. The right ventricular  size is normal.   3. The mitral valve is normal in structure. No evidence of mitral valve  regurgitation. No evidence of mitral stenosis.   4. The aortic valve is normal in structure. Aortic valve regurgitation is  not visualized. No aortic stenosis is present.   5. The inferior vena cava is normal in size with greater than 50%  respiratory variability, suggesting right atrial pressure of 3 mmHg.    ZIO monitor  The patient wore the monitor for 6 days 20 hours starting October 19, 2020. Indication: Palpitations   The minimum heart rate was 71 bpm, maximum heart rate was 120 bpm, and average heart rate was 71 bpm. Predominant underlying rhythm was Sinus Rhythm.     Premature atrial complexes were rare less than 1%. Premature Ventricular complexes were rare less than 1%.   No ventricular tachycardia, no pauses, No AV block, no supraventricular tachycardia and no atrial fibrillation present.   9 patient triggered events associated with premature atrial complexes and premature ventricular complex.  10 diary events associated with sinus rhythm and premature ventricular complex.      Conclusion: This study is remarkable for rare symptomatic premature atrial complex and premature ventricular complex.    Recent Labs: 08/05/2023: ALT 18; BUN 16; Creatinine, Ser 1.16; Hemoglobin 12.8; Platelets 298.0; Potassium 4.0; Sodium 139; TSH 1.44  Recent Lipid Panel    Component Value Date/Time   CHOL 132 08/05/2023 1511   CHOL 129 07/07/2019 0900   TRIG 88.0 08/05/2023 1511   HDL 40.90 08/05/2023 1511   HDL 33 (L) 07/07/2019 0900   CHOLHDL 3 08/05/2023 1511   VLDL 17.6 08/05/2023 1511   LDLCALC 74 08/05/2023 1511   LDLCALC 64 11/28/2020 1358    Physical Exam:    VS:  BP (!) 144/86 (BP Location: Right Arm, Patient Position: Sitting, Cuff Size: Normal)   Pulse 84   Ht 5\' 10"  (1.778 m)   Wt 281 lb (127.5 kg)   BMI 40.32 kg/m     Wt Readings from Last 3 Encounters:  08/16/23 281 lb (127.5 kg)  08/07/23 274 lb (124.3 kg)  07/10/23 275 lb (124.7 kg)     GEN: Well nourished, well developed in no acute distress HEENT: Normal NECK: No JVD; No carotid bruits LYMPHATICS: No lymphadenopathy CARDIAC: S1S2 noted,RRR, no murmurs, rubs, gallops RESPIRATORY:  Clear to auscultation without rales, wheezing or rhonchi  ABDOMEN: Soft, non-tender, non-distended, +bowel sounds, no guarding. EXTREMITIES: No edema, No cyanosis, no clubbing MUSCULOSKELETAL:  No deformity  SKIN: Warm and dry NEUROLOGIC:  Alert and oriented x 3, non-focal PSYCHIATRIC:  Normal affect, good insight  ASSESSMENT:    1. PVC (premature ventricular contraction)   2. Hypertension associated with type 2 diabetes mellitus (HCC)   3. PAC (premature atrial contraction)   4. OSA (obstructive sleep apnea)   5. Morbid obesity (HCC)     PLAN:  She is doing well from a cardiovascular standpoint.  No medication changes today. Uterine Fibroids Considering hysterectomy or embolization.  During the time that her procedure is scheduled if no chest pain or shortness of breath the patient can proceed her  procedure without the need for any cardiovascular testing.  Hypertension Elevated blood pressure noted during visit. Patient is currently on hydrochlorothiazide. -Advise patient to monitor blood pressure at home for two weeks (morning and evening) and upload readings via Mind Chart.  Sleep Apnea Patient started on CPAP machine in October and reports feeling rested in the morning. -Continue CPAP use as tolerated.     The patient is in agreement with the above plan. The patient left the office in stable condition.  The patient will follow up in 1 year or sooner if needed.   Medication Adjustments/Labs and Tests Ordered: Current medicines are reviewed at length with the patient today.  Concerns regarding medicines are outlined above.  Orders Placed This Encounter  Procedures   EKG 12-Lead    No orders of the defined types were placed in this encounter.    Patient Instructions  Medication Instructions:  Your physician recommends that you continue on your current medications as directed. Please refer to the Current Medication list given to you today.  *If you need a refill on your cardiac medications before your next appointment, please call your pharmacy*  Follow-Up: At [facility], you and your health needs are our priority.  As part of our continuing mission to provide you with exceptional heart care, we have created designated Provider Care Teams.  These Care Teams include your primary Cardiologist (physician) and Advanced Practice Providers (APPs -  Physician Assistants and Nurse Practitioners) who all work together to provide you with the care you need, when you need it.  Your next appointment:   1 year(s)  Provider:   Thomasene Ripple, DO     Other Instructions Please take your blood pressure daily for 2 weeks and send in a MyChart message. Please include heart rates. (One message at the end of the 2 weeks).   HOW TO TAKE YOUR BLOOD PRESSURE: Rest 5 minutes before  taking your blood pressure. Don't smoke or drink caffeinated beverages for at least 30 minutes before. Take your blood pressure before (not after) you eat. Sit comfortably with your back supported and both feet on the floor (don't cross your legs). Elevate your arm to heart level on a table or a desk. Use the proper sized cuff. It should fit smoothly and snugly around your bare upper arm. There should be enough room to slip a fingertip under the cuff. The bottom edge of the cuff should be 1 inch above the crease of the elbow. Ideally, take 3 measurements at one sitting and record the average.    You are invited to attend: [facility]'s [facility] Community event on February Friday 7th 2025 at [facility] ([address] [place], [place], [place] [address]) from 8am-12pm. Feel free to invite other women to attend!  See you there!     Adopting a Healthy Lifestyle.  Know what a healthy weight is for you (roughly BMI <25) and aim to maintain this   Aim for 7+ servings of fruits and vegetables daily   65-80+ fluid ounces of water or unsweet tea for healthy kidneys   Limit to max 1 drink of alcohol per day; avoid smoking/tobacco   Limit animal fats in diet for cholesterol and heart health - choose grass fed  whenever available   Avoid highly processed foods, and foods high in saturated/trans fats   Aim for low stress - take time to unwind and care for your mental health   Aim for 150 min of moderate intensity exercise weekly for heart health, and weights twice weekly for bone health   Aim for 7-9 hours of sleep daily   When it comes to diets, agreement about the perfect plan isnt easy to find, even among the experts. Experts at the [facility] of [employer] developed an idea known as the Healthy Eating Plate. Just imagine a plate divided into logical, healthy portions.   The emphasis is on diet quality:   Load up on vegetables and fruits - one-half of your plate: Aim for color and variety, and  remember that potatoes dont count.   Go for whole grains - one-quarter of your plate: Whole wheat, barley, wheat berries, quinoa, oats, brown rice, and foods made with them. If you want pasta, go with whole wheat pasta.   Protein power - one-quarter of your plate: Fish, chicken, beans, and nuts are all healthy, versatile protein sources. Limit red meat.   The diet, however, does go beyond the plate, offering a few other suggestions.   Use healthy plant oils, such as olive, canola, soy, corn, sunflower and peanut. Check the labels, and avoid partially hydrogenated oil, which have unhealthy trans fats.   If youre thirsty, drink water. Coffee and tea are good in moderation, but skip sugary drinks and limit milk and dairy products to one or two daily servings.   The type of carbohydrate in the diet is more important than the amount. Some sources of carbohydrates, such as vegetables, fruits, whole grains, and beans-are healthier than others.   Finally, stay active  Signed, Thomasene Ripple, DO  08/16/2023 9:04 AM    [facility]

## 2023-08-16 NOTE — Patient Instructions (Signed)
Medication Instructions:  Your physician recommends that you continue on your current medications as directed. Please refer to the Current Medication list given to you today.  *If you need a refill on your cardiac medications before your next appointment, please call your pharmacy*  Follow-Up: At [facility], you and your health needs are our priority.  As part of our continuing mission to provide you with exceptional heart care, we have created designated Provider Care Teams.  These Care Teams include your primary Cardiologist (physician) and Advanced Practice Providers (APPs -  Physician Assistants and Nurse Practitioners) who all work together to provide you with the care you need, when you need it.  Your next appointment:   1 year(s)  Provider:   Thomasene Ripple, DO     Other Instructions Please take your blood pressure daily for 2 weeks and send in a MyChart message. Please include heart rates. (One message at the end of the 2 weeks).   HOW TO TAKE YOUR BLOOD PRESSURE: Rest 5 minutes before taking your blood pressure. Don't smoke or drink caffeinated beverages for at least 30 minutes before. Take your blood pressure before (not after) you eat. Sit comfortably with your back supported and both feet on the floor (don't cross your legs). Elevate your arm to heart level on a table or a desk. Use the proper sized cuff. It should fit smoothly and snugly around your bare upper arm. There should be enough room to slip a fingertip under the cuff. The bottom edge of the cuff should be 1 inch above the crease of the elbow. Ideally, take 3 measurements at one sitting and record the average.    You are invited to attend: Women's Heart Community event on February Friday 7th 2025 at [facility] ([address] [place], [place], [place] [address]) from 8am-12pm. Feel free to invite other women to attend!  See you there!

## 2023-08-19 ENCOUNTER — Other Ambulatory Visit: Payer: Self-pay | Admitting: Cardiology

## 2023-08-19 DIAGNOSIS — I152 Hypertension secondary to endocrine disorders: Secondary | ICD-10-CM

## 2023-08-20 ENCOUNTER — Other Ambulatory Visit (HOSPITAL_BASED_OUTPATIENT_CLINIC_OR_DEPARTMENT_OTHER): Payer: Self-pay

## 2023-08-20 MED ORDER — LOSARTAN POTASSIUM 100 MG PO TABS
100.0000 mg | ORAL_TABLET | Freq: Every day | ORAL | 3 refills | Status: DC
  Filled 2023-08-20: qty 90, 90d supply, fill #0
  Filled 2023-11-20: qty 90, 90d supply, fill #1
  Filled 2024-02-18: qty 90, 90d supply, fill #2
  Filled 2024-05-17: qty 90, 90d supply, fill #3

## 2023-08-29 ENCOUNTER — Encounter: Payer: Self-pay | Admitting: Adult Health

## 2023-08-29 ENCOUNTER — Ambulatory Visit: Payer: BC Managed Care – PPO | Admitting: Adult Health

## 2023-08-29 VITALS — BP 130/78 | HR 91 | Ht 70.0 in | Wt 280.2 lb

## 2023-08-29 DIAGNOSIS — G4733 Obstructive sleep apnea (adult) (pediatric): Secondary | ICD-10-CM | POA: Diagnosis not present

## 2023-08-29 NOTE — Patient Instructions (Addendum)
CPAP at bedtime, wear all night long for at least 6hrs.  May like the Dreamwear nasal pillows  Change CPAP pressure to 6-10cmH2O May like the dreamwear nasal pillows Healthy sleep regimen Do not drive if sleepy Work on healthy weight Follow-up in 6 months and as needed

## 2023-08-29 NOTE — Progress Notes (Signed)
@Patient  ID: Brandi Lester, female    DOB: 12/18/1965, 57 y.o.   MRN: [id number]  Chief Complaint  Patient presents with   Follow-up    Referring provider: Bradd Canary, MD  HPI: Discussed the use of AI scribe software for clinical note transcription with the patient, who gave verbal consent to proceed.  History of Present Illness   57 year old female seen for sleep consult March 26, 2023 for snoring and daytime sleepiness found to have moderate obstructive sleep apnea  08/29/23 Follow up : OSA  Patient returns for 48-month follow-up.  Patient was seen last visit after recent home sleep study showed moderate sleep apnea.  She was started on CPAP therapy.  Patient says she is starting to get used to her CPAP.  Says that she feels that she is benefiting with decreased daytime sleepiness.. Recently made adjustments to the pressure settings based on advice from a CPAP group on Facebook. She reports that the pressure was initially too high, causing discomfort and disrupting sleep. The patient also switched to a full face mask due to advice from a provider who suggested it would be beneficial due to her allergies and occasional nasal congestion. However, the patient has found that the full face mask is not ideal and is considering switching back to a nasal mask or trying a nasal pillow mask.  The patient has been using the CPAP machine for an average of five hours per night, with some nights reaching six to seven hours. She reports waking up in the middle of the night, sometimes struggling to fall back asleep. Despite this, the patient has noticed an improvement in her energy levels and less daytime sleepiness since starting CPAP therapy. She also reports dreaming.  CPAP download shows excellent compliance with daily average usage at 5 hours patient is on auto CPAP 4 to 10 cm H2O.  AHI 0.5/hour daily average pressure at 9 cm H2O.  She is currently working on weight loss.  Has recently  started [facility]          TEST/EVENTS :   Allergies  Allergen Reactions   Penicillins Itching and Swelling    Has patient had a PCN reaction causing immediate rash, facial/tongue/throat swelling, SOB or lightheadedness with hypotension: yes Has patient had a PCN reaction causing severe rash involving mucus membranes or skin necrosis: no Has patient had a PCN reaction that required hospitalization no Has patient had a PCN reaction occurring within the last 10 years: yes If all of the above answers are "NO", then may proceed with Cephalosporin use.    Victoza [Liraglutide]     Vomiting    Immunization History  Administered Date(s) Administered   Influenza Whole 06/22/2009, 08/03/2010   Influenza, Seasonal, Injecte, Preservative Fre 05/27/2023   Influenza,inj,Quad PF,6+ Mos 07/20/2013, 05/11/2014, 06/29/2015, 06/26/2016, 07/23/2017, 06/04/2019, 05/31/2020, 06/06/2021, 06/18/2022   Influenza-Unspecified 06/17/2014, 07/23/2017   Moderna Covid-19 Vaccine Bivalent Booster 38yrs & up 06/06/2021   PFIZER Comirnaty(Gray Top)Covid-19 Tri-Sucrose Vaccine 03/24/2021   PFIZER(Purple Top)SARS-COV-2 Vaccination 10/31/2019, 11/21/2019, 07/07/2020   Pfizer(Comirnaty)Fall Seasonal Vaccine 12 years and older 05/29/2023   Pneumococcal Conjugate-13 09/15/2015   Pneumococcal Polysaccharide-23 07/01/2018   Td 07/21/2009   Tdap 05/31/2020   Zoster Recombinant(Shingrix) 12/15/2020, 03/24/2021    Past Medical History:  Diagnosis Date   ADD (attention deficit disorder)    Allergy    allergic rhinitis   Anemia 07/25/2013   Angio-edema    Anxiety    Arthritis    not dx'd  Back pain    Costochondritis 02/20/2015   Diabetes mellitus    Type II   previously 3 years ago - no longer on meds    Dyslipidemia 07/25/2013   Fungus infection 06/2012   Left great toe   GERD (gastroesophageal reflux disease)    Hypertension    IBS (irritable bowel syndrome)    Ingrown nail 06/2012   right foot next  to the last toe   Insomnia 04/20/2013   Joint pain    Lactose intolerance    Leg edema    Obesity    Pedal edema 02/20/2015   Prediabetes    Preventative health care 09/25/2015   PVC (premature ventricular contraction)    Recurrent upper respiratory infection (URI)    Urticaria     Tobacco History: Social History   Tobacco Use  Smoking Status Former   Current packs/day: 0.00   Average packs/day: 1 pack/day for 15.0 years (15.0 ttl pk-yrs)   Types: Cigarettes   Start date: 09/03/1986   Quit date: 09/03/2001   Years since quitting: 22.0   Passive exposure: Never  Smokeless Tobacco Never   Counseling given: Not Answered   Outpatient Medications Prior to Visit  Medication Sig Dispense Refill   acetaminophen (TYLENOL 8 HOUR) 650 MG CR tablet Take 1 tablet (650 mg total) every 8 (eight) hours as needed by mouth for pain.     amLODipine (NORVASC) 5 MG tablet Take 1 tablet (5 mg total) by mouth daily. 90 tablet 0   atorvastatin (LIPITOR) 10 MG tablet Take 1 tablet (10 mg total) by mouth daily. 90 tablet 3   buPROPion (WELLBUTRIN SR) 200 MG 12 hr tablet Take 1 tablet (200 mg total) by mouth in the morning. 90 tablet 0   Carbinoxamine Maleate 4 MG TABS Take 1 tablet (4 mg total) by mouth 4 (four) times daily as needed. 28 tablet 0   carvedilol (COREG) 25 MG tablet Take 1 tablet (25 mg total) by mouth 2 (two) times daily. 180 tablet 3   cholecalciferol (VITAMIN D3) 25 MCG (1000 UT) tablet Take 4,000 Units by mouth daily.     Continuous Glucose Receiver (FREESTYLE LIBRE 3 READER) DEVI Use as directed to check sugar. Dx Code E11.9 1 each 0   Continuous Glucose Sensor (FREESTYLE LIBRE 3 PLUS SENSOR) MISC Change sensor every 15 days. Dx Code E11.9 2 each 5   EPINEPHrine (EPIPEN 2-PAK) 0.3 mg/0.3 mL IJ SOAJ injection Use as directed for severe allergic reaction 2 each 1   Fluticasone Propionate (XHANCE) 93 MCG/ACT EXHU Place 2 sprays into both nostrils 2 (two) times daily for nasal congestion.  16 mL 5   hydrochlorothiazide (MICROZIDE) 12.5 MG capsule Take 1 capsule (12.5 mg total) by mouth daily. 90 capsule 1   ibuprofen (ADVIL,MOTRIN) 600 MG tablet Take 1 tablet (600 mg total) by mouth every 6 (six) hours as needed. 90 tablet 0   losartan (COZAAR) 100 MG tablet Take 1 tablet (100 mg total) by mouth daily. 90 tablet 3   medroxyPROGESTERone (PROVERA) 10 MG tablet Take 1 tablet (10 mg total) by mouth daily. 90 tablet 3   metFORMIN (GLUCOPHAGE) 500 MG tablet Take 1 tablet (500 mg total) by mouth 2 (two) times daily with a meal. 180 tablet 0   ondansetron (ZOFRAN-ODT) 4 MG disintegrating tablet Dissolve 1 tablet (4 mg total) by mouth every 8 (eight) hours as needed for nausea or vomiting. 20 tablet 0   pantoprazole (PROTONIX) 40 MG tablet Take 1 tablet (  40 mg total) by mouth daily. 90 tablet 0   potassium chloride SA (KLOR-CON M) 20 MEQ tablet Take 1 tablet (20 mEq total) by mouth 3 (three) times daily. 270 tablet 0   tirzepatide ([name]) 12.5 MG/0.5ML Pen Inject 12.5 mg into the skin once a week. 2 mL 0   No facility-administered medications prior to visit.     Review of Systems:   Constitutional:   No  weight loss, night sweats,  Fevers, chills, +fatigue, or  lassitude.  HEENT:   No headaches,  Difficulty swallowing,  Tooth/dental problems, or  Sore throat,                No sneezing, itching, ear ache, nasal congestion, post nasal drip,   CV:  No chest pain,  Orthopnea, PND, swelling in lower extremities, anasarca, dizziness, palpitations, syncope.   GI  No heartburn, indigestion, abdominal pain, nausea, vomiting, diarrhea, change in bowel habits, loss of appetite, bloody stools.   Resp: No shortness of breath with exertion or at rest.  No excess mucus, no productive cough,  No non-productive cough,  No coughing up of blood.  No change in color of mucus.  No wheezing.  No chest wall deformity  Skin: no rash or lesions.  GU: no dysuria, change in color of urine, no urgency or  frequency.  No flank pain, no hematuria   MS:  No joint pain or swelling.  No decreased range of motion.  No back pain.    Physical Exam  BP 130/78 (BP Location: Left Arm, Patient Position: Sitting, Cuff Size: Large)   Pulse 91   Ht 5\' 10"  (1.778 m)   Wt 280 lb 3.2 oz (127.1 kg)   SpO2 100%   BMI 40.20 kg/m   GEN: A/Ox3; pleasant , NAD, well nourished    HEENT:  [place]/AT,  NOSE-clear, THROAT-clear, no lesions, no postnasal drip or exudate noted.   NECK:  Supple w/ fair ROM; no JVD; normal carotid impulses w/o bruits; no thyromegaly or nodules palpated; no lymphadenopathy.    RESP  Clear  P & A; w/o, wheezes/ rales/ or rhonchi. no accessory muscle use, no dullness to percussion  CARD:  RRR, no m/r/g, no peripheral edema, pulses intact, no cyanosis or clubbing.  GI:   Soft & nt; nml bowel sounds; no organomegaly or masses detected.   Musco: Warm bil, no deformities or joint swelling noted.   Neuro: alert, no focal deficits noted.    Skin: Warm, no lesions or rashes    Lab Results:  CBC    Component Value Date/Time   WBC 5.9 08/05/2023 1511   RBC 4.50 08/05/2023 1511   HGB 12.8 08/05/2023 1511   HGB 12.3 07/07/2019 0900   HCT 39.1 08/05/2023 1511   HCT 37.3 07/07/2019 0900   PLT 298.0 08/05/2023 1511   PLT 304 07/07/2019 0900   MCV 87.0 08/05/2023 1511   MCV 84 07/07/2019 0900   MCH 28.3 11/28/2020 1358   MCHC 32.8 08/05/2023 1511   RDW 14.2 08/05/2023 1511   RDW 14.2 07/07/2019 0900   LYMPHSABS 1.7 08/05/2023 1511   LYMPHSABS 1.8 07/07/2019 0900   MONOABS 0.6 08/05/2023 1511   EOSABS 0.1 08/05/2023 1511   EOSABS 0.1 07/07/2019 0900   BASOSABS 0.1 08/05/2023 1511   BASOSABS 0.1 07/07/2019 0900    BMET    Component Value Date/Time   NA 139 08/05/2023 1511   NA 139 10/20/2020 0933   K 4.0 08/05/2023 1511  CL 102 08/05/2023 1511   CO2 28 08/05/2023 1511   GLUCOSE 77 08/05/2023 1511   BUN 16 08/05/2023 1511   BUN 8 10/20/2020 0933   CREATININE 1.16  08/05/2023 1511   CREATININE 0.79 08/10/2022 1503   CALCIUM 9.7 08/05/2023 1511   GFRNONAA 103 10/20/2020 0933   GFRAA 119 10/20/2020 0933    BNP No results found for: "BNP"  ProBNP No results found for: "PROBNP"  Imaging: No results found.  Administration History     None           No data to display          No results found for: "NITRICOXIDE"      Assessment & Plan:  Assessment and Plan    Obstructive Sleep Apnea  -excellent control and compliance on nocturnal CPAP.  Patient has perceived benefit.. Will change to DreamWear nasal pillows for comfort.  Will adjust auto CPAP to 6 to 10 cm H2O.  Plan  Patient Instructions  CPAP at bedtime, wear all night long for at least 6hrs.  May like the Dreamwear nasal pillows  Change CPAP pressure to 6-10cmH2O May like the dreamwear nasal pillows Healthy sleep regimen Do not drive if sleepy Work on healthy weight Follow-up in 6 months and as needed    Weight Management   She is taking [name] for weight management, and the potential benefits of weight loss on sleep apnea were discussed. She will continue [facility] as prescribed and is encouraged to persist with ongoing weight management efforts, discussing the potential for sleep apnea improvement with weight loss.  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 We encourage regular follow-up visits and advise maintaining a healthy lifestyle and managing stress levels.  Follow-up   A follow-up is scheduled in six months and As needed       Rubye Oaks, NP 08/29/2023

## 2023-09-01 ENCOUNTER — Encounter: Payer: Self-pay | Admitting: Cardiology

## 2023-09-01 DIAGNOSIS — I152 Hypertension secondary to endocrine disorders: Secondary | ICD-10-CM

## 2023-09-03 ENCOUNTER — Other Ambulatory Visit (HOSPITAL_BASED_OUTPATIENT_CLINIC_OR_DEPARTMENT_OTHER): Payer: Self-pay

## 2023-09-03 MED ORDER — AMLODIPINE BESYLATE 10 MG PO TABS
10.0000 mg | ORAL_TABLET | Freq: Every day | ORAL | 2 refills | Status: DC
Start: 2023-09-03 — End: 2024-05-23
  Filled 2023-09-03: qty 90, 90d supply, fill #0
  Filled 2023-11-27: qty 90, 90d supply, fill #1
  Filled 2024-02-25: qty 90, 90d supply, fill #2

## 2023-09-06 ENCOUNTER — Other Ambulatory Visit: Payer: Self-pay | Admitting: *Deleted

## 2023-09-06 DIAGNOSIS — E1169 Type 2 diabetes mellitus with other specified complication: Secondary | ICD-10-CM

## 2023-09-09 ENCOUNTER — Other Ambulatory Visit (INDEPENDENT_AMBULATORY_CARE_PROVIDER_SITE_OTHER): Payer: 59

## 2023-09-09 DIAGNOSIS — E1169 Type 2 diabetes mellitus with other specified complication: Secondary | ICD-10-CM | POA: Diagnosis not present

## 2023-09-09 DIAGNOSIS — E669 Obesity, unspecified: Secondary | ICD-10-CM | POA: Diagnosis not present

## 2023-09-10 LAB — BASIC METABOLIC PANEL
BUN: 9 mg/dL (ref 6–23)
CO2: 26 meq/L (ref 19–32)
Calcium: 9.6 mg/dL (ref 8.4–10.5)
Chloride: 102 meq/L (ref 96–112)
Creatinine, Ser: 0.87 mg/dL (ref 0.40–1.20)
GFR: 75.06 mL/min (ref >60.00)
Glucose, Bld: 114 mg/dL — ABNORMAL HIGH (ref 70–99)
Potassium: 3.9 meq/L (ref 3.5–5.1)
Sodium: 139 meq/L (ref 135–145)

## 2023-09-10 LAB — MICROALBUMIN / CREATININE URINE RATIO
Creatinine,U: 258.7 mg/dL
Microalb Creat Ratio: 4.5 mg/g (ref 0.0–30.0)
Microalb, Ur: 11.7 mg/dL — ABNORMAL HIGH (ref 0.0–1.9)

## 2023-09-12 ENCOUNTER — Other Ambulatory Visit: Payer: Self-pay | Admitting: Family Medicine

## 2023-09-12 ENCOUNTER — Encounter (INDEPENDENT_AMBULATORY_CARE_PROVIDER_SITE_OTHER): Payer: Self-pay | Admitting: Physician Assistant

## 2023-09-12 ENCOUNTER — Other Ambulatory Visit (HOSPITAL_BASED_OUTPATIENT_CLINIC_OR_DEPARTMENT_OTHER): Payer: Self-pay

## 2023-09-12 ENCOUNTER — Ambulatory Visit (INDEPENDENT_AMBULATORY_CARE_PROVIDER_SITE_OTHER): Payer: 59 | Admitting: Physician Assistant

## 2023-09-12 VITALS — BP 137/84 | HR 101 | Temp 99.5°F | Ht 68.0 in | Wt 276.0 lb

## 2023-09-12 DIAGNOSIS — Z6841 Body Mass Index (BMI) 40.0 and over, adult: Secondary | ICD-10-CM

## 2023-09-12 DIAGNOSIS — Z7985 Long-term (current) use of injectable non-insulin antidiabetic drugs: Secondary | ICD-10-CM

## 2023-09-12 DIAGNOSIS — E1159 Type 2 diabetes mellitus with other circulatory complications: Secondary | ICD-10-CM | POA: Diagnosis not present

## 2023-09-12 DIAGNOSIS — E669 Obesity, unspecified: Secondary | ICD-10-CM

## 2023-09-12 DIAGNOSIS — E1169 Type 2 diabetes mellitus with other specified complication: Secondary | ICD-10-CM | POA: Diagnosis not present

## 2023-09-12 DIAGNOSIS — R809 Proteinuria, unspecified: Secondary | ICD-10-CM

## 2023-09-12 DIAGNOSIS — I152 Hypertension secondary to endocrine disorders: Secondary | ICD-10-CM

## 2023-09-12 DIAGNOSIS — E1129 Type 2 diabetes mellitus with other diabetic kidney complication: Secondary | ICD-10-CM | POA: Diagnosis not present

## 2023-09-12 DIAGNOSIS — G4733 Obstructive sleep apnea (adult) (pediatric): Secondary | ICD-10-CM

## 2023-09-12 MED ORDER — TIRZEPATIDE 12.5 MG/0.5ML ~~LOC~~ SOAJ
12.5000 mg | SUBCUTANEOUS | 0 refills | Status: DC
  Filled 2023-09-12: qty 2, 28d supply, fill #0

## 2023-09-12 MED ORDER — PANTOPRAZOLE SODIUM 40 MG PO TBEC
40.0000 mg | DELAYED_RELEASE_TABLET | Freq: Every day | ORAL | 1 refills | Status: DC
  Filled 2023-09-12: qty 90, 90d supply, fill #0
  Filled 2024-04-14: qty 90, 90d supply, fill #1

## 2023-09-12 NOTE — Progress Notes (Signed)
 SUBJECTIVE:  Chief Complaint: Obesity  Interim History: She is up 2 lbs since last visit.  Down 49 lbs overall,  TBW loss of 15.1%  Brandi Lester is a 58 yo female with a history of obesity, type 2 diabetes, hypertension, and hypercholesterolemia, presents for a follow-up visit regarding obesity treatment management. The patient's current medication regimen includes [name]  12.5mg  weekly, Metformin  500mg  twice daily, Losartan  100mg  daily, Hydrochlorothiazide  12.5mg  daily, Carvedilol  25mg  twice daily, Bupropion  200mg  daily, Lipitor 10mg  daily, and Norvasc  10mg  daily.  The patient reports a recent increase in blood pressure, which she attributes to caffeine intake. She also mentions a weight gain of two pounds over the holiday period, which she attributes to dietary changes, including increased consumption of ham and other holiday foods. Despite this, the patient has been making efforts to improve her diet, including preparing meals with lean proteins and vegetables, and considering a shift towards more plant-based foods.  The patient also reports concerns about proteinuria, questioning whether her consumption of protein shakes could be contributing to this issue. She has reduced her protein intake to around 110 grams per day. She expresses interest in continuing her protein shake consumption but is considering switching to plant-based protein sources.  The patient also discusses her sleep apnea treatment, reporting that she recently obtained a new mask for her CPAP machine. She notes that she has been sleeping for seven hours straight since using the new mask and feels more rested and energetic.  In addition to her primary health concerns, the patient mentions a personal event of a family wedding in the coming months  . She also discusses her struggles with fibroids and the possibility of undergoing embolization or hysterectomy. 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   Brandi Lester is here to discuss her progress with her obesity treatment plan. She is on the Category 4 Plan and states she is following her eating plan approximately 70 % of the time. She states she is exercising walking/exercise bike 30-45 minutes 3-4 times per week.   OBJECTIVE: Visit Diagnoses: Problem List Items Addressed This Visit     Hypertension associated with type 2 diabetes mellitus (HCC)   Relevant Medications   tirzepatide  ([name] ) 12.5 MG/0.5ML Pen   Diabetes mellitus (HCC) - Primary   Relevant Medications   tirzepatide  ([name] ) 12.5 MG/0.5ML Pen   BMI 40.0-44.9, adult (HCC) Current BMI 40.1   Relevant Medications   tirzepatide  ([name] ) 12.5 MG/0.5ML Pen   OSA (obstructive sleep apnea)   Obesity (HCC)- Start BMI 47.99   Relevant Medications   tirzepatide  ([name] ) 12.5 MG/0.5ML Pen   Other Visit Diagnoses       Microalbuminuria due to type 2 diabetes mellitus (HCC)       Relevant Medications   tirzepatide  ([name] ) 12.5 MG/0.5ML Pen     Obesity Currently on [name]  12.5 mg weekly. Discussed dietary habits and holiday eating.  Encouraged to stay on track with diet and exercise. - Refill [name]  12.5 mg weekly Continue Cat 4 meal plan overall but reduce protein intake to 90-100 grams daily. And monitor renal function   Type 2 Diabetes Mellitus Lab Results  Component Value Date   HGBA1C 5.6 08/05/2023   HGBA1C 5.4 02/19/2023   HGBA1C 5.5 10/22/2022   Lab Results  Component Value Date   MICROALBUR 11.7 (H) 09/09/2023   LDLCALC 74 08/05/2023   CREATININE 0.87 09/09/2023    Currently on metformin   500 mg twice a day. May need to reduce metformin  as well . Will monitor for now.  - Continue metformin  500 mg twice a day Continue/refill [name]  12.5 mg weekly.  She is working  on nutrition plan to decrease simple carbohydrates, increase lean proteins and exercise to promote weight loss and improve  glycemic control .   Hypertension Currently on losartan  100 mg daily, hydrochlorothiazide  12.5 mg daily, carvedilol  25 mg twice daily, and Norvasc  10 mg daily. Blood pressure 137/90, likely influenced by caffeine intake just prior to coming into office today. Discussed importance of blood pressure control for kidney function preservation. - Continue losartan  100 mg daily - Continue hydrochlorothiazide  12.5 mg daily - Continue carvedilol  25 mg twice daily - Continue Norvasc  10 mg daily Seeing cardiology regularly for management. Continue to work on nutrition plan to promote weight loss and improve BP control.    Microalbuminuria Reports protein in urine. Advised to reduce protein intake. Kidney function improving with increased glomerular filtration rate. Discussed reducing protein intake to 90 grams per day from 110+ grams per day.Make sure BP remains under good control with current medications- Reduce protein intake to 90 grams per day - Recheck kidney function at next visit  Sleep Apnea on CPAP Reports improved sleep with new CPAP mask, sleeping 7 hours straight and feeling more rested. - Continue using CPAP with the new mask  Fibroids Discussed upcoming consultation for possible embolization of fibroids. Mentioned potential need for hysterectomy if embolization is not effective.  - She has appointment for Consultation for fibroid embolization on January 30th  General Health Maintenance Advised on dietary modifications, including plant-based protein options and reducing sodium intake. Discussed avoiding high-sodium snacks like prosciutto and jerky sticks. Encouraged to try edamame and roasted chickpeas as healthy snacks. - Encourage plant-based protein options - Avoid high-sodium snacks  Follow-up - Follow-up appointment on February 6th at 3:30 PM.  Vitals Temp: 99.5 F (37.5 C) BP: 137/84 Pulse Rate: (!) 101 SpO2: 99 %   Anthropometric Measurements Height: 5' 8 (1.727  m) Weight: 276 lb (125.2 kg) BMI (Calculated): 41.98 Weight at Last Visit: 274 lb Weight Lost Since Last Visit: 0 Weight Gained Since Last Visit: 2 lb Starting Weight: 325 lb Total Weight Loss (lbs): 49 lb (22.2 kg) Peak Weight: 330 lb   Body Composition  Body Fat %: 51.8 % Fat Mass (lbs): 143.4 lbs Muscle Mass (lbs): 126.6 lbs Visceral Fat Rating : 17   Other Clinical Data Fasting: No Labs: No Today's Visit #: 101 Starting Date: 05/23/17     ASSESSMENT AND PLAN:  Diet: Brandi Lester is currently in the action stage of change. As such, her goal is to continue with weight loss efforts. She has agreed to Category 4 Plan and with protein goals of 90-100 grams daily (reduced from usual 130 grams daily .  Exercise: Ytzel has been instructed to work up to a goal of 150 minutes of combined cardio and strengthening exercise per week for weight loss and overall health benefits.   Behavior Modification:  We discussed the following Behavioral Modification Strategies today: increasing lean protein intake, decreasing simple carbohydrates, increasing vegetables, increase H2O intake, increase high fiber foods, no skipping meals, avoiding temptations, and planning for success. We discussed various medication options to help Candida with her weight loss efforts and we both agreed to continue [name]  12.5 mg weekly for Type 2 diabetes management and continue to work on nutritional and behavioral strategies to promote weight loss.    Return in  about 4 weeks (around 10/10/2023).SABRA She was informed of the importance of frequent follow up visits to maximize her success with intensive lifestyle modifications for her multiple health conditions.  Attestation Statements:   Reviewed by clinician on day of visit: allergies, medications, problem list, medical history, surgical history, family history, social history, and previous encounter notes.   Time spent on visit including pre-visit chart review and  post-visit care and charting was 43 minutes.    Shylee Durrett, PA-C

## 2023-09-19 ENCOUNTER — Encounter: Payer: Self-pay | Admitting: Family Medicine

## 2023-09-19 ENCOUNTER — Other Ambulatory Visit (HOSPITAL_BASED_OUTPATIENT_CLINIC_OR_DEPARTMENT_OTHER): Payer: Self-pay

## 2023-09-19 ENCOUNTER — Ambulatory Visit: Payer: 59 | Admitting: Family Medicine

## 2023-09-19 VITALS — BP 130/80 | HR 91 | Temp 98.6°F | Resp 18 | Ht 68.0 in | Wt 280.0 lb

## 2023-09-19 DIAGNOSIS — J0140 Acute pansinusitis, unspecified: Secondary | ICD-10-CM

## 2023-09-19 DIAGNOSIS — R051 Acute cough: Secondary | ICD-10-CM | POA: Diagnosis not present

## 2023-09-19 MED ORDER — DOXYCYCLINE HYCLATE 100 MG PO TABS
100.0000 mg | ORAL_TABLET | Freq: Two times a day (BID) | ORAL | 0 refills | Status: DC
  Filled 2023-09-19: qty 20, 10d supply, fill #0

## 2023-09-19 MED ORDER — BENZONATATE 100 MG PO CAPS
100.0000 mg | ORAL_CAPSULE | Freq: Three times a day (TID) | ORAL | 0 refills | Status: DC | PRN
  Filled 2023-09-19: qty 40, 7d supply, fill #0

## 2023-09-19 MED ORDER — XHANCE 93 MCG/ACT NA EXHU
2.0000 | INHALANT_SUSPENSION | Freq: Two times a day (BID) | NASAL | 5 refills | Status: AC
  Filled 2023-09-19: qty 16, 30d supply, fill #0

## 2023-09-19 MED ORDER — AZELASTINE HCL 0.1 % NA SOLN
1.0000 | Freq: Two times a day (BID) | NASAL | 12 refills | Status: AC
  Filled 2023-09-19: qty 30, 30d supply, fill #0
  Filled 2023-11-27: qty 30, 30d supply, fill #1
  Filled 2023-12-28: qty 30, 30d supply, fill #2
  Filled 2024-02-18: qty 30, 30d supply, fill #3
  Filled 2024-03-22: qty 30, 30d supply, fill #4
  Filled 2024-04-21: qty 30, 30d supply, fill #5
  Filled 2024-05-17: qty 30, 30d supply, fill #6
  Filled 2024-06-23: qty 30, 30d supply, fill #7
  Filled 2024-09-18: qty 30, 30d supply, fill #8

## 2023-09-19 NOTE — Patient Instructions (Signed)

## 2023-09-19 NOTE — Progress Notes (Signed)
Established Patient Office Visit  Subjective   Patient ID: Brandi Lester, female    DOB: [birth year]  Age: 58 y.o. MRN: [id number]  Chief Complaint  Patient presents with   Sinus Problem    X1 week, pt states having headache, congestion, productive cough, itchy ears, no covid test    HPI Discussed the use of AI scribe software for clinical note transcription with the patient, who gave verbal consent to proceed.  History of Present Illness   The patient, a [employer] manager, presents with symptoms of upper respiratory illness that have been ongoing for over two weeks. The initial symptom was hip pain, which has since been accompanied by nasal congestion and drainage, causing significant discomfort and sleep disturbances. The patient reports that the drainage has progressively worsened and has been associated with coughing. A sore throat has also recently developed.  The patient has been self-managing with Mucinex and an inhaler prescribed by an allergist. They also mention using Xhance, a type of Flonase, but currently do not have it. The patient has found relief from these medications in the past.  In addition to the respiratory symptoms, the patient mentions a malfunctioning blood sugar monitor, indicating a possible history of diabetes. They also report taking Zyrtec daily, suggesting a history of allergies.  The patient's symptoms have been disruptive to their sleep, with the nasal congestion and coughing being particularly bothersome. They have previously found relief from these symptoms with the use of [place] and express a need for a nighttime cough medicine.  The patient has a known allergy to penicillin and has previously been treated with doxycycline for similar symptoms. The last episode of similar illness was approximately a year ago.      Patient Active Problem List   Diagnosis Date Noted   Obesity (HCC)- Start BMI 47.99 08/07/2023   OSA (obstructive  sleep apnea) 05/27/2023   BMI 40.0-44.9, adult (HCC) Current BMI 40.1 03/11/2023   Disordered sleep 12/12/2022   Diabetes mellitus (HCC) 11/14/2022   Fatigue 10/28/2022   Seasonal and perennial allergic rhinitis 10/08/2022   Bilateral lower extremity edema 05/17/2022   At risk for nausea 02/10/2022   Chronic rhinitis 01/12/2022   PAC (premature atrial contraction) 01/16/2021   Right elbow pain 12/05/2020   Palpitations 10/19/2020   Chest pain of uncertain etiology 10/19/2020   PVC (premature ventricular contraction)    Lactose intolerance    Joint pain    IBS (irritable bowel syndrome)    Hypertension associated with type 2 diabetes mellitus (HCC)    Back pain    Arthritis    Anxiety    Angio-edema    Allergy    ADD (attention deficit disorder)    High serum vitamin B12 06/01/2020   Low level of high density lipoprotein (HDL) 07/08/2019   Fibroid 09/05/7251   Depression 06/26/2018   Hypertriglyceridemia 09/12/2017   Vitamin D deficiency 07/02/2017   Hypokalemia 09/25/2015   Recurrent sinusitis 09/25/2015   Preventative health care 09/25/2015   Pedal edema 02/20/2015   Gastro-esophageal reflux 11/15/2013   Anemia 07/25/2013   Hyperlipidemia associated with type 2 diabetes mellitus (HCC) 07/25/2013   Acute non-recurrent pansinusitis 07/03/2013   Insomnia 04/20/2013   Musculoskeletal pain 12/07/2011   Type 2 diabetes mellitus with obesity (HCC) 11/26/2008   HEMORRHOIDS, EXTERNAL 03/25/2008   PREMATURE VENTRICULAR CONTRACTIONS 03/26/2007   Allergic rhinitis 03/26/2007   Past Medical History:  Diagnosis Date   ADD (attention deficit disorder)  Allergy    allergic rhinitis   Anemia 07/25/2013   Angio-edema    Anxiety    Arthritis    not dx'd   Back pain    Costochondritis 02/20/2015   Diabetes mellitus    Type II   previously 3 years ago - no longer on meds    Dyslipidemia 07/25/2013   Fungus infection 06/2012   Left great toe   GERD (gastroesophageal reflux  disease)    Hypertension    IBS (irritable bowel syndrome)    Ingrown nail 06/2012   right foot next to the last toe   Insomnia 04/20/2013   Joint pain    Lactose intolerance    Leg edema    Obesity    Pedal edema 02/20/2015   Prediabetes    Preventative health care 09/25/2015   PVC (premature ventricular contraction)    Recurrent upper respiratory infection (URI)    Urticaria    Past Surgical History:  Procedure Laterality Date   HYSTEROSCOPY N/A 10/04/2015   Procedure: HYSTEROSCOPY with Removal IUD;  Surgeon: Waynard Reeds, MD;  Location: [facility];  Service: Gynecology;  Laterality: N/A;   lasik     eye surgery   WISDOM TOOTH EXTRACTION     Social History   Tobacco Use   Smoking status: Former    Current packs/day: 0.00    Average packs/day: 1 pack/day for 15.0 years (15.0 ttl pk-yrs)    Types: Cigarettes    Start date: 09/03/1986    Quit date: 09/03/2001    Years since quitting: 22.0    Passive exposure: Never   Smokeless tobacco: Never  Vaping Use   Vaping status: Never Used  Substance Use Topics   Alcohol use: Yes    Comment: rare   Drug use: No   Social History   Socioeconomic History   Marital status: Single    Spouse name: Not on file   Number of children: Not on file   Years of education: Not on file   Highest education level: Not on file  Occupational History   Occupation: Runner, broadcasting/film/video, Public librarian  Tobacco Use   Smoking status: Former    Current packs/day: 0.00    Average packs/day: 1 pack/day for 15.0 years (15.0 ttl pk-yrs)    Types: Cigarettes    Start date: 09/03/1986    Quit date: 09/03/2001    Years since quitting: 22.0    Passive exposure: Never   Smokeless tobacco: Never  Vaping Use   Vaping status: Never Used  Substance and Sexual Activity   Alcohol use: Yes    Comment: rare   Drug use: No   Sexual activity: Yes    Birth control/protection: None  Other Topics Concern   Not on file  Social History Narrative   Not on file   Social Drivers  of Health   Financial Resource Strain: Not on file  Food Insecurity: Not on file  Transportation Needs: Not on file  Physical Activity: Not on file  Stress: Not on file  Social Connections: Not on file  Intimate Partner Violence: Not on file   Family Status  Relation Name Status   Mother  Deceased at age 86       MI   Father  Deceased   Sister  (Not Specified)   Sister  Alive   Sister  Alive   MGM  (Not Specified)   Other  (Not Specified)   Neg Hx  (Not Specified)  No partnership data on  file   Family History  Problem Relation Age of Onset   Allergic rhinitis Mother    Heart attack Mother 77   Hypertension Mother    Heart disease Mother    Obesity Mother    Cancer Father        prostate   Prostate cancer Father    Allergic rhinitis Sister    Heart attack Sister 9   Allergic rhinitis Sister    Allergic rhinitis Sister    Allergic rhinitis Maternal Grandmother    Hypertension Other    Colon cancer Neg Hx    Colon polyps Neg Hx    Esophageal cancer Neg Hx    Stomach cancer Neg Hx    Rectal cancer Neg Hx    Allergies  Allergen Reactions   Penicillins Itching and Swelling    Has patient had a PCN reaction causing immediate rash, facial/tongue/throat swelling, SOB or lightheadedness with hypotension: yes Has patient had a PCN reaction causing severe rash involving mucus membranes or skin necrosis: no Has patient had a PCN reaction that required hospitalization no Has patient had a PCN reaction occurring within the last 10 years: yes If all of the above answers are "NO", then may proceed with Cephalosporin use.    Victoza [Liraglutide]     Vomiting      Review of Systems  Constitutional:  Negative for chills, fever and malaise/fatigue.  HENT:  Positive for congestion, sinus pain and sore throat. Negative for hearing loss.   Eyes:  Negative for blurred vision and discharge.  Respiratory:  Positive for cough and sputum production. Negative for shortness of  breath and wheezing.   Cardiovascular:  Negative for chest pain, palpitations and leg swelling.  Gastrointestinal:  Negative for abdominal pain, blood in stool, constipation, diarrhea, heartburn, nausea and vomiting.  Genitourinary:  Negative for dysuria, frequency, hematuria and urgency.  Musculoskeletal:  Negative for back pain, falls and myalgias.  Skin:  Negative for rash.  Neurological:  Negative for dizziness, sensory change, loss of consciousness, weakness and headaches.  Endo/Heme/Allergies:  Negative for environmental allergies. Does not bruise/bleed easily.  Psychiatric/Behavioral:  Negative for depression and suicidal ideas. The patient is not nervous/anxious and does not have insomnia.       Objective:     BP 130/80 (BP Location: Left Arm, Patient Position: Sitting, Cuff Size: Large)   Pulse 91   Temp 98.6 F (37 C) (Oral)   Resp 18   Ht 5\' 8"  (1.727 m)   Wt 280 lb (127 kg)   SpO2 98%   BMI 42.57 kg/m  BP Readings from Last 3 Encounters:  09/19/23 130/80  09/12/23 137/84  08/29/23 130/78   Wt Readings from Last 3 Encounters:  09/19/23 280 lb (127 kg)  09/12/23 276 lb (125.2 kg)  08/29/23 280 lb 3.2 oz (127.1 kg)   SpO2 Readings from Last 3 Encounters:  09/19/23 98%  09/12/23 99%  08/29/23 100%      Physical Exam Vitals and nursing note reviewed.  Constitutional:      General: She is not in acute distress.    Appearance: Normal appearance. She is well-developed.  HENT:     Head: Normocephalic and atraumatic.     Nose: Congestion and rhinorrhea present.     Right Sinus: Maxillary sinus tenderness present.     Left Sinus: Maxillary sinus tenderness present.     Mouth/Throat:     Pharynx: Posterior oropharyngeal erythema and postnasal drip present.  Eyes:  General: No scleral icterus.       Right eye: No discharge.        Left eye: No discharge.  Cardiovascular:     Rate and Rhythm: Normal rate and regular rhythm.     Heart sounds: No murmur  heard. Pulmonary:     Effort: Pulmonary effort is normal. No respiratory distress.     Breath sounds: Normal breath sounds.  Musculoskeletal:        General: Normal range of motion.     Cervical back: Normal range of motion and neck supple.     Right lower leg: No edema.     Left lower leg: No edema.  Skin:    General: Skin is warm and dry.  Neurological:     Mental Status: She is alert and oriented to person, place, and time.  Psychiatric:        Mood and Affect: Mood normal.        Behavior: Behavior normal.        Thought Content: Thought content normal.        Judgment: Judgment normal.      No results found for any visits on 09/19/23.  Last CBC Lab Results  Component Value Date   WBC 5.9 08/05/2023   HGB 12.8 08/05/2023   HCT 39.1 08/05/2023   MCV 87.0 08/05/2023   MCH 28.3 11/28/2020   RDW 14.2 08/05/2023   PLT 298.0 08/05/2023   Last metabolic panel Lab Results  Component Value Date   GLUCOSE 114 (H) 09/09/2023   NA 139 09/09/2023   K 3.9 09/09/2023   CL 102 09/09/2023   CO2 26 09/09/2023   BUN 9 09/09/2023   CREATININE 0.87 09/09/2023   GFR 75.06 09/09/2023   CALCIUM 9.6 09/09/2023   PHOS 2.5 07/16/2014   PROT 7.4 08/05/2023   ALBUMIN 4.3 08/05/2023   LABGLOB 2.8 12/01/2019   AGRATIO 1.5 12/01/2019   BILITOT 0.4 08/05/2023   ALKPHOS 83 08/05/2023   AST 15 08/05/2023   ALT 18 08/05/2023   Last lipids Lab Results  Component Value Date   CHOL 132 08/05/2023   HDL 40.90 08/05/2023   LDLCALC 74 08/05/2023   TRIG 88.0 08/05/2023   CHOLHDL 3 08/05/2023   Last hemoglobin A1c Lab Results  Component Value Date   HGBA1C 5.6 08/05/2023   Last thyroid functions Lab Results  Component Value Date   TSH 1.44 08/05/2023   T3TOTAL 112 07/07/2019   Last vitamin D Lab Results  Component Value Date   VD25OH 74.02 08/05/2023   Last vitamin B12 and Folate Lab Results  Component Value Date   VITAMINB12 339 06/18/2022   FOLATE 7.1 05/23/2017       The 10-year ASCVD risk score (Arnett DK, et al., 2019) is: 10.9%    Assessment & Plan:   Problem List Items Addressed This Visit       Unprioritized   Acute non-recurrent pansinusitis - Primary   Relevant Medications   azelastine (ASTELIN) 0.1 % nasal spray   Fluticasone Propionate (XHANCE) 93 MCG/ACT EXHU   benzonatate (TESSALON PERLES) 100 MG capsule   doxycycline (VIBRA-TABS) 100 MG tablet   Other Visit Diagnoses       Acute cough       Relevant Medications   benzonatate (TESSALON PERLES) 100 MG capsule     Assessment and Plan    Sinusitis Presents with sinusitis, experiencing hip pain, nasal drainage, pressure, sore throat, and cough for over a week, without  fever. Symptoms have worsened, affecting sleep. Mucinex and Xhance inhaler provide some relief. Previous doxycycline treatment was effective. Discussed doxycycline's benefits in reducing bacterial infection and potential gastrointestinal side effects. Informed about Tessalon Perles for cough relief, which reduces coughing without significant drowsiness. Prescribe doxycycline 100 mg twice daily and Tessalon Perles. Continue Xhance inhaler, Flonase, and Astelin nasal sprays.  Allergic Rhinitis Allergic rhinitis is managed with daily Zyrtec and nasal sprays, Flonase and Astelin. Symptoms include nasal congestion and drainage, worsened by seasonal allergens like ragweed. Continued use of antihistamines and nasal sprays is beneficial for symptom management and inflammation reduction. Continue daily Zyrtec and nasal sprays.  Diabetes Mellitus The blood sugar monitoring device, Libre, is malfunctioning, causing frequent alerts. Accurate blood sugar monitoring is crucial for managing diabetes and preventing complications. Send prescription for [facility] sensor replacement.        No follow-ups on file.    Donato Schultz, DO

## 2023-09-20 ENCOUNTER — Other Ambulatory Visit (HOSPITAL_BASED_OUTPATIENT_CLINIC_OR_DEPARTMENT_OTHER): Payer: Self-pay

## 2023-09-20 ENCOUNTER — Other Ambulatory Visit: Payer: Self-pay

## 2023-09-30 ENCOUNTER — Other Ambulatory Visit: Payer: Self-pay | Admitting: Family Medicine

## 2023-09-30 ENCOUNTER — Other Ambulatory Visit (HOSPITAL_BASED_OUTPATIENT_CLINIC_OR_DEPARTMENT_OTHER): Payer: Self-pay

## 2023-09-30 DIAGNOSIS — I10 Essential (primary) hypertension: Secondary | ICD-10-CM

## 2023-09-30 DIAGNOSIS — E876 Hypokalemia: Secondary | ICD-10-CM

## 2023-09-30 DIAGNOSIS — T7840XA Allergy, unspecified, initial encounter: Secondary | ICD-10-CM

## 2023-09-30 MED ORDER — POTASSIUM CHLORIDE CRYS ER 20 MEQ PO TBCR
20.0000 meq | EXTENDED_RELEASE_TABLET | Freq: Three times a day (TID) | ORAL | 0 refills | Status: DC
  Filled 2023-09-30: qty 270, 90d supply, fill #0

## 2023-10-01 ENCOUNTER — Other Ambulatory Visit (HOSPITAL_BASED_OUTPATIENT_CLINIC_OR_DEPARTMENT_OTHER): Payer: Self-pay

## 2023-10-03 ENCOUNTER — Ambulatory Visit
Admission: RE | Admit: 2023-10-03 | Discharge: 2023-10-03 | Disposition: A | Discharge status: Home / Self Care | Payer: 59 | Source: Ambulatory Visit | Attending: Obstetrics & Gynecology | Admitting: Obstetrics & Gynecology

## 2023-10-03 DIAGNOSIS — D259 Leiomyoma of uterus, unspecified: Secondary | ICD-10-CM

## 2023-10-03 HISTORY — PX: IR RADIOLOGIST EVAL & MGMT: IMG5224

## 2023-10-03 NOTE — Consult Note (Signed)
Chief Complaint: Patient was seen in consultation today for uterine fibroids at the request of Brandi Lester  Referring Physician(s): Brandi Lester  History of Present Illness: Brandi Lester is a 58 y.o. female (G0 P0) female with symptomatic uterine fibroids.  She is not menopausal or perimenopausal at this time.  Her cycles are somewhat irregular but her bleeding is very heavy.  She requires use of both superabsorbent pads and tampons changed as often as every hour.  She is mildly anemic.  Her last hemoglobin was 12.8.  In addition to significant menorrhagia, she also complains of dysmenorrhea with severe cramping.  She endorses more modest bulk related symptoms.  She feels heaviness and fullness in her pelvis.  No dysuria or constipation.  She is currently taking Provera 10 mg daily.  Her last Pap smear was 06/03/2023 and was normal.  Endometrial biopsy was performed 06/26/2023 and was also normal.  She has a large bulky fibroids on both ultrasound and MRI.  No adenomyosis.  No significant pedunculated or high risk fibroids.  Her symptoms are moderately severe.  She scored a 44 out of 100 on the uterine fibroid symptom severity score.  Her symptoms are also debilitating.  She scored a 54 out of 100 on the uterine fibroid health-related quality of life questionnaire.  Past Medical History:  Diagnosis Date   ADD (attention deficit disorder)    Allergy    allergic rhinitis   Anemia 07/25/2013   Angio-edema    Anxiety    Arthritis    not dx'd   Back pain    Costochondritis 02/20/2015   Diabetes mellitus    Type II   previously 3 years ago - no longer on meds    Dyslipidemia 07/25/2013   Fungus infection 06/2012   Left great toe   GERD (gastroesophageal reflux disease)    Hypertension    IBS (irritable bowel syndrome)    Ingrown nail 06/2012   right foot next to the last toe   Insomnia 04/20/2013   Joint pain    Lactose intolerance    Leg edema     Obesity    Pedal edema 02/20/2015   Prediabetes    Preventative health care 09/25/2015   PVC (premature ventricular contraction)    Recurrent upper respiratory infection (URI)    Urticaria     Past Surgical History:  Procedure Laterality Date   HYSTEROSCOPY N/A 10/04/2015   Procedure: HYSTEROSCOPY with Removal IUD;  Surgeon: Waynard Reeds, MD;  Location: [facility];  Service: Gynecology;  Laterality: N/A;   IR RADIOLOGIST EVAL & MGMT  10/03/2023   lasik     eye surgery   WISDOM TOOTH EXTRACTION      Allergies: Penicillins and Victoza [liraglutide]  Medications: Prior to Admission medications   Medication Sig Start Date End Date Taking? Authorizing Provider  acetaminophen (TYLENOL 8 HOUR) 650 MG CR tablet Take 1 tablet (650 mg total) every 8 (eight) hours as needed by mouth for pain. 07/18/17   Demetrio Lapping, PA-C  amLODipine (NORVASC) 10 MG tablet Take 1 tablet (10 mg total) by mouth daily. 09/03/23   Tobb, Kardie, DO  atorvastatin (LIPITOR) 10 MG tablet Take 1 tablet (10 mg total) by mouth daily. 07/25/22   Tobb, Kardie, DO  azelastine (ASTELIN) 0.1 % nasal spray Place 1 spray into both nostrils 2 (two) times daily. Use in each nostril as directed 09/19/23   Zola Button, Grayling Congress, DO  benzonatate (TESSALON PERLES) 100 MG capsule Take 1-2  capsules (100-200 mg total) by mouth 3 (three) times daily as needed. 09/19/23   Donato Schultz, DO  buPROPion (WELLBUTRIN SR) 200 MG 12 hr tablet Take 1 tablet (200 mg total) by mouth in the morning. 08/07/23   Rayburn, Fanny Bien, PA-C  Carbinoxamine Maleate 4 MG TABS Take 1 tablet (4 mg total) by mouth 4 (four) times daily as needed. 10/08/22   Hetty Blend, FNP  carvedilol (COREG) 25 MG tablet Take 1 tablet (25 mg total) by mouth 2 (two) times daily. 07/29/23   Tobb, Kardie, DO  cholecalciferol (VITAMIN D3) 25 MCG (1000 UT) tablet Take 4,000 Units by mouth daily.    [provider]  Continuous Glucose Receiver (FREESTYLE LIBRE 3  READER) DEVI Use as directed to check sugar. Dx Code E11.9 07/29/23   Bradd Canary, MD  Continuous Glucose Sensor (FREESTYLE LIBRE 3 PLUS SENSOR) MISC Change sensor every 15 days. Dx Code E11.9 07/29/23   Bradd Canary, MD  doxycycline (VIBRA-TABS) 100 MG tablet Take 1 tablet (100 mg total) by mouth 2 (two) times daily. 09/19/23   Donato Schultz, DO  EPINEPHrine (EPIPEN 2-PAK) 0.3 mg/0.3 mL IJ SOAJ injection Use as directed for severe allergic reaction 01/11/22   Alfonse Spruce, MD  Fluticasone Propionate Timmothy Sours) 93 MCG/ACT EXHU Place 2 sprays into both nostrils 2 (two) times daily for nasal congestion. 09/19/23   Donato Schultz, DO  hydrochlorothiazide (MICROZIDE) 12.5 MG capsule Take 1 capsule (12.5 mg total) by mouth daily. 07/29/23   Bradd Canary, MD  ibuprofen (ADVIL,MOTRIN) 600 MG tablet Take 1 tablet (600 mg total) by mouth every 6 (six) hours as needed. 10/04/15   Waynard Reeds, MD  losartan (COZAAR) 100 MG tablet Take 1 tablet (100 mg total) by mouth daily. 08/20/23   Tobb, Kardie, DO  medroxyPROGESTERone (PROVERA) 10 MG tablet Take 1 tablet (10 mg total) by mouth daily. 06/03/23   Milas Hock, MD  metFORMIN (GLUCOPHAGE) 500 MG tablet Take 1 tablet (500 mg total) by mouth 2 (two) times daily with a meal. 08/07/23 11/06/23  Rayburn, Fanny Bien, PA-C  ondansetron (ZOFRAN-ODT) 4 MG disintegrating tablet Dissolve 1 tablet (4 mg total) by mouth every 8 (eight) hours as needed for nausea or vomiting. 07/10/23   Rayburn, Fanny Bien, PA-C  pantoprazole (PROTONIX) 40 MG tablet Take 1 tablet (40 mg total) by mouth daily. 09/12/23   Bradd Canary, MD  potassium chloride SA (KLOR-CON M) 20 MEQ tablet Take 1 tablet (20 mEq total) by mouth 3 (three) times daily. 09/30/23 09/29/24  Bradd Canary, MD  tirzepatide [facility]) 12.5 MG/0.5ML Pen Inject 12.5 mg into the skin once a week. 09/12/23   Rayburn, Fanny Bien, PA-C     Family History  Problem Relation Age of Onset    Allergic rhinitis Mother    Heart attack Mother 3   Hypertension Mother    Heart disease Mother    Obesity Mother    Cancer Father        prostate   Prostate cancer Father    Allergic rhinitis Sister    Heart attack Sister 99   Allergic rhinitis Sister    Allergic rhinitis Sister    Allergic rhinitis Maternal Grandmother    Hypertension Other    Colon cancer Neg Hx    Colon polyps Neg Hx    Esophageal cancer Neg Hx    Stomach cancer Neg Hx    Rectal cancer Neg Hx  Social History   Socioeconomic History   Marital status: Single    Spouse name: Not on file   Number of children: Not on file   Years of education: Not on file   Highest education level: Not on file  Occupational History   Occupation: Runner, broadcasting/film/video, Public librarian  Tobacco Use   Smoking status: Former    Current packs/day: 0.00    Average packs/day: 1 pack/day for 15.0 years (15.0 ttl pk-yrs)    Types: Cigarettes    Start date: 09/03/1986    Quit date: 09/03/2001    Years since quitting: 22.0    Passive exposure: Never   Smokeless tobacco: Never  Vaping Use   Vaping status: Never Used  Substance and Sexual Activity   Alcohol use: Yes    Comment: rare   Drug use: No   Sexual activity: Yes    Birth control/protection: None  Other Topics Concern   Not on file  Social History Narrative   Not on file   Social Drivers of Health   Financial Resource Strain: Not on file  Food Insecurity: Not on file  Transportation Needs: Not on file  Physical Activity: Not on file  Stress: Not on file  Social Connections: Not on file    Review of Systems: A 12 point ROS discussed and pertinent positives are indicated in the HPI above.  All other systems are negative.  Review of Systems  Vital Signs: BP (!) 142/79 (BP Location: Left Arm, Patient Position: Sitting, Cuff Size: Large)   Pulse 90   Temp 99 F (37.2 C) (Oral)   Ht 5\' 10"  (1.778 m)   Wt 127 kg   SpO2 98% Comment: room air  BMI 40.18 kg/m      Physical Exam Constitutional:      General: She is not in acute distress.    Appearance: Normal appearance. She is obese.  HENT:     Head: Normocephalic and atraumatic.  Eyes:     General: No scleral icterus. Cardiovascular:     Rate and Rhythm: Normal rate.  Pulmonary:     Effort: Pulmonary effort is normal.  Abdominal:     Tenderness: There is no abdominal tenderness. There is no guarding.  Skin:    General: Skin is warm and dry.  Neurological:     Mental Status: She is alert and oriented to person, place, and time.  Psychiatric:        Mood and Affect: Mood normal.        Behavior: Behavior normal.       Imaging: IR Radiologist Eval & Mgmt Result Date: 10/03/2023 EXAM: NEW PATIENT OFFICE VISIT CHIEF COMPLAINT: SEE NOTE IN EPIC HISTORY OF PRESENT ILLNESS: SEE NOTE IN EPIC REVIEW OF SYSTEMS: SEE NOTE IN EPIC PHYSICAL EXAMINATION: SEE NOTE IN EPIC ASSESSMENT AND PLAN: SEE NOTE IN EPIC Electronically Signed   By: Malachy Moan M.D.   On: 10/03/2023 14:27    Labs:  CBC: Recent Labs    10/22/22 1151 02/19/23 1452 08/05/23 1511  WBC 5.5 5.7 5.9  HGB 13.1 12.3 12.8  HCT 38.3 37.4 39.1  PLT 342.0 285.0 298.0    COAGS: No results for input(s): "INR", "APTT" in the last 8760 hours.  BMP: Recent Labs    10/22/22 1151 02/19/23 1452 08/05/23 1511 09/09/23 1502  NA 140 142 139 139  K 3.9 3.9 4.0 3.9  CL 104 106 102 102  CO2 27 27 28 26   GLUCOSE 99 72 77 114*  BUN 6 13 16 9   CALCIUM 9.6 9.4 9.7 9.6  CREATININE 0.70 0.73 1.16 0.87    LIVER FUNCTION TESTS: Recent Labs    10/22/22 1151 02/19/23 1452 08/05/23 1511  BILITOT 0.4 0.3 0.4  AST 11 14 15   ALT 15 23 18   ALKPHOS 88 71 83  PROT 7.2 7.3 7.4  ALBUMIN 4.2 4.2 4.3    TUMOR MARKERS: No results for input(s): "AFPTM", "CEA", "CA199", "CHROMGRNA" in the last 8760 hours.  Assessment and Plan:  Very pleasant 58 year old female with symptomatic uterine fibroids.  Her symptom severity is  moderate and her symptoms are disabling.  She has been assessed and found to be several years away from menopause despite her age.  She is very interested in pursuing treatment of her uterine fibroids due to the severity of her symptoms and her underlying anemia.  She is not particularly interested in surgical hysterectomy at this time but is very interested in minimally invasive uterine artery embolization.  We discussed the natural history of uterine fibroids as well as the treatment options, risks benefits and alternatives.  I believe she is an excellent candidate for [place].  She would like to proceed with uterine artery embolization.  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  1.)  Please schedule for superior hypogastric nerve block (postprocedural pain) and uterine artery embolization.  Discussed with patient appropriate timing for the procedure based on her schedule.  Thank you for this interesting consult.  I greatly enjoyed meeting Gertha Amberrose Friebel and look forward to participating in their care.  A copy of this report was sent to the requesting provider on this date.  Electronically Signed: Sterling Big 10/03/2023, 3:01 PM   I spent a total of  60 Minutes  in face to face in clinical consultation, greater than 50% of which was counseling/coordinating care for uterine fibroids.

## 2023-10-04 ENCOUNTER — Other Ambulatory Visit: Payer: Self-pay

## 2023-10-09 ENCOUNTER — Other Ambulatory Visit: Payer: Self-pay | Admitting: Family Medicine

## 2023-10-09 DIAGNOSIS — Z1231 Encounter for screening mammogram for malignant neoplasm of breast: Secondary | ICD-10-CM

## 2023-10-10 ENCOUNTER — Ambulatory Visit (INDEPENDENT_AMBULATORY_CARE_PROVIDER_SITE_OTHER): Payer: 59 | Admitting: Physician Assistant

## 2023-10-10 ENCOUNTER — Encounter (INDEPENDENT_AMBULATORY_CARE_PROVIDER_SITE_OTHER): Payer: Self-pay | Admitting: Physician Assistant

## 2023-10-10 ENCOUNTER — Other Ambulatory Visit (HOSPITAL_BASED_OUTPATIENT_CLINIC_OR_DEPARTMENT_OTHER): Payer: Self-pay

## 2023-10-10 VITALS — BP 120/71 | HR 73 | Temp 99.6°F | Ht 68.0 in | Wt 278.0 lb

## 2023-10-10 DIAGNOSIS — I152 Hypertension secondary to endocrine disorders: Secondary | ICD-10-CM

## 2023-10-10 DIAGNOSIS — E1159 Type 2 diabetes mellitus with other circulatory complications: Secondary | ICD-10-CM

## 2023-10-10 DIAGNOSIS — Z7985 Long-term (current) use of injectable non-insulin antidiabetic drugs: Secondary | ICD-10-CM

## 2023-10-10 DIAGNOSIS — E785 Hyperlipidemia, unspecified: Secondary | ICD-10-CM

## 2023-10-10 DIAGNOSIS — E1169 Type 2 diabetes mellitus with other specified complication: Secondary | ICD-10-CM

## 2023-10-10 DIAGNOSIS — Z6841 Body Mass Index (BMI) 40.0 and over, adult: Secondary | ICD-10-CM

## 2023-10-10 DIAGNOSIS — G4733 Obstructive sleep apnea (adult) (pediatric): Secondary | ICD-10-CM

## 2023-10-10 DIAGNOSIS — Z7984 Long term (current) use of oral hypoglycemic drugs: Secondary | ICD-10-CM

## 2023-10-10 DIAGNOSIS — D259 Leiomyoma of uterus, unspecified: Secondary | ICD-10-CM

## 2023-10-10 DIAGNOSIS — E669 Obesity, unspecified: Secondary | ICD-10-CM

## 2023-10-10 MED ORDER — ATORVASTATIN CALCIUM 10 MG PO TABS
10.0000 mg | ORAL_TABLET | Freq: Every day | ORAL | 3 refills | Status: DC
  Filled 2023-10-10: qty 90, 90d supply, fill #0

## 2023-10-10 MED ORDER — METFORMIN HCL 500 MG PO TABS
500.0000 mg | ORAL_TABLET | Freq: Two times a day (BID) | ORAL | 0 refills | Status: DC
  Filled 2023-10-10 – 2023-11-01 (×2): qty 180, 90d supply, fill #0

## 2023-10-10 MED ORDER — TIRZEPATIDE 15 MG/0.5ML ~~LOC~~ SOAJ
15.0000 mg | SUBCUTANEOUS | 0 refills | Status: DC
  Filled 2023-10-10: qty 6, 84d supply, fill #0

## 2023-10-10 NOTE — Progress Notes (Signed)
 SUBJECTIVE: Discussed the use of AI scribe software for clinical note transcription with the patient, who gave verbal consent to proceed.  Chief Complaint: Obesity  Interim History: She is up 2 lbs since last visit.  Bio impedence scale reviewed with the patient:  Muscle mass + 1 lb.  Adipose mass + 0.6 lbs  Brandi Lester is a 58 year old female with obesity, type 2 diabetes, hypertension, hyperlipidemia, and obstructive sleep apnea who presents for follow-up of her obesity treatment plan.  She is currently on [name]  12.5 mg weekly, metformin  500 mg twice daily, and bupropion  SR 200 mg in the morning for obesity management.  She engages in physical activities such as 'sweating to the oldies' and walking during recess at school. Her dietary habits include consuming smaller meals and snacks, with a recent indulgence at a wedding causing nausea.  Her type 2 diabetes is well-controlled with an A1c of 5.6, improved from 6.7 at diagnosis in 2011. She continues metformin  500 mg twice daily in addition to [name]  12.5 mg weekly.  Hypertension is managed with losartan  100 mg daily, amlodipine  10 mg daily, and carvedilol  25 mg. She reports good blood pressure control, even after stressful days at school.  For hyperlipidemia, she takes Lipitor 10 mg daily but is unsure about the last refill of atorvastatin  and plans to check on this.  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 She uses Metamucil for constipation, which was worse when on Ozempic  and notes fewer problems with constipation following switch to [name] .  Brandi Lester is here to discuss her progress with her obesity treatment plan. She is on the Category 4 Plan and states she is following her eating plan approximately 75 % of the time. She states she is exercising walking/aerobics 60 minutes 2-3 times per  week.   OBJECTIVE: Visit Diagnoses: Problem List Items Addressed This Visit     Hyperlipidemia associated with type 2 diabetes mellitus (HCC)   Relevant Medications   atorvastatin  (LIPITOR) 10 MG tablet   metFORMIN  (GLUCOPHAGE ) 500 MG tablet   tirzepatide  ([name] ) 15 MG/0.5ML Pen   Hypertension associated with type 2 diabetes mellitus (HCC)   Relevant Medications   atorvastatin  (LIPITOR) 10 MG tablet   metFORMIN  (GLUCOPHAGE ) 500 MG tablet   tirzepatide  ([name] ) 15 MG/0.5ML Pen   Diabetes mellitus (HCC) - Primary   Relevant Medications   atorvastatin  (LIPITOR) 10 MG tablet   metFORMIN  (GLUCOPHAGE ) 500 MG tablet   tirzepatide  ([name] ) 15 MG/0.5ML Pen   BMI 40.0-44.9, adult (HCC) Current BMI 40.1   Relevant Medications   metFORMIN  (GLUCOPHAGE ) 500 MG tablet   tirzepatide  ([name] ) 15 MG/0.5ML Pen   Obesity (HCC)- Start BMI 47.99   Relevant Medications   metFORMIN  (GLUCOPHAGE ) 500 MG tablet   tirzepatide  ([name] ) 15 MG/0.5ML Pen  Obesity Brandi Lester, a 58 year old female, is on [name]  12.5 mg weekly and has lost 47 pounds, though her weight has slightly increased recently. Discussed the importance of maintaining a lower caloric intake and the potential need to adjust her metabolic plan. Explained that her body may naturally lower metabolism after significant weight loss and that [name]  can also contribute to this. Emphasized the need for continued exercise and dietary monitoring. - Increase [name]  to 15 mg weekly if available - Schedule metabolic test on April 14th at 8:00 AM - Continue current exercise regimen and dietary monitoring  Type 2 Diabetes Mellitus  Brandi Lester's type 2 diabetes is well-controlled with an A1c of 5.6 as of December. She is on metformin  500 mg twice daily and [name] . Emphasized the importance of continuing her medication despite the improved A1c. Discussed that insurance companies may base coverage on recent A1c levels, but her history  justifies continued medication. - Continue metformin  500 mg twice daily - Continue/Increase [name]  to 15 mg weekly - Refill metformin  prescription She is working  on nutrition plan to decrease simple carbohydrates, increase lean proteins and exercise to promote weight loss and improve glycemic control .  Hypertension Brandi Lester's hypertension is well-controlled with losartan  100 mg daily, amlodipine  10 mg daily, and carvedilol  25 mg. - Continue losartan  100 mg daily - Continue amlodipine  10 mg daily - Continue carvedilol  25 mg Continue to work on nutrition plan to promote weight loss and improve BP control.   Hyperlipidemia Brandi Lester is on atorvastatin  10 mg daily for hyperlipidemia. Discussed the need for continued medication. - Continue atorvastatin  10 mg daily - Refill atorvastatin  prescription  Continue to work on nutrition plan -decreasing simple carbohydrates, increasing lean proteins, decreasing saturated fats and cholesterol , avoiding trans fats and exercise as able to promote weight loss, improve lipids and decrease cardiovascular risks. Continue [name]  as well to lower CV risks  Uterine Leiomyoma Brandi Lester is planning to undergo embolization in June. She is borderline anemic with a hemoglobin of 12.8.  prevent constipation. Confirmed that she can continue [name]  during the procedure. 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 Discussed the potential benefits of vitamin C and zinc supplementation to lessen cold symptoms and shorten duration. - Consider vitamin C and zinc supplementation - Continue current exercise regimen - Maintain healthy eating habits  Follow-up - Follow-up appointment on March 10th at 3:30 PM for refills - Metabolic test and follow-up appointment on April 14th  at 8:00 AM.  Vitals Temp: 99.6 F (37.6 C) BP: 120/71 Pulse Rate: 73 SpO2: 98 %   Anthropometric Measurements Height: 5' 8 (1.727 m) Weight: 278 lb (126.1 kg) BMI (Calculated): 42.28 Weight at Last Visit: 276 lb Weight Lost Since Last Visit: 0 Weight Gained Since Last Visit: 2 lb Starting Weight: 325 lb Total Weight Loss (lbs): 47 lb (21.3 kg) Peak Weight: 330 lb   Body Composition  Body Fat %: 51.7 % Fat Mass (lbs): 144 lbs Muscle Mass (lbs): 127.6 lbs Total Body Water (lbs): 105.4 lbs Visceral Fat Rating : 17   Other Clinical Data Fasting: no Labs: no Today's Visit #: 102 Starting Date: 05/23/17     ASSESSMENT AND PLAN:  Diet: Brandi Lester is currently in the action stage of change. As such, her goal is to continue with weight loss efforts. She has agreed to Category 4 Plan.  Exercise: Brandi Lester has been instructed to continue exercising as is for weight loss and overall health benefits.   Behavior Modification:  We discussed the following Behavioral Modification Strategies today: increasing lean protein intake, decreasing simple carbohydrates, increasing vegetables, increase H2O intake, increase high fiber foods, no skipping meals, celebration eating strategies, avoiding temptations, and planning for success. We discussed various medication options to help Brandi Lester with her weight loss efforts and we both agreed to increase [name]  to 15 mg weekly for Type 2 diabetes management, continue metformin  for Type 2 diabetes and .  Return in about 4 weeks (around 11/07/2023).Brandi Lester She  was informed of the importance of frequent follow up visits to maximize her success with intensive lifestyle modifications for her multiple health conditions.  Attestation Statements:   Reviewed by clinician on day of visit: allergies, medications, problem list, medical history, surgical history, family history, social history, and previous encounter notes.   Time spent on visit including  pre-visit chart review and post-visit care and charting was 35 minutes.    Sarthak Rubenstein, PA-C

## 2023-10-25 ENCOUNTER — Ambulatory Visit
Admission: RE | Admit: 2023-10-25 | Discharge: 2023-10-25 | Disposition: A | Discharge status: Home / Self Care | Payer: 59 | Source: Ambulatory Visit

## 2023-10-25 DIAGNOSIS — Z1231 Encounter for screening mammogram for malignant neoplasm of breast: Secondary | ICD-10-CM

## 2023-11-01 ENCOUNTER — Other Ambulatory Visit (HOSPITAL_BASED_OUTPATIENT_CLINIC_OR_DEPARTMENT_OTHER): Payer: Self-pay

## 2023-11-11 ENCOUNTER — Ambulatory Visit (INDEPENDENT_AMBULATORY_CARE_PROVIDER_SITE_OTHER): Payer: 59 | Admitting: Physician Assistant

## 2023-11-11 ENCOUNTER — Encounter (INDEPENDENT_AMBULATORY_CARE_PROVIDER_SITE_OTHER): Payer: Self-pay | Admitting: Physician Assistant

## 2023-11-11 ENCOUNTER — Other Ambulatory Visit (HOSPITAL_BASED_OUTPATIENT_CLINIC_OR_DEPARTMENT_OTHER): Payer: Self-pay

## 2023-11-11 VITALS — BP 128/81 | HR 87 | Temp 98.3°F | Ht 68.0 in | Wt 272.0 lb

## 2023-11-11 DIAGNOSIS — E1169 Type 2 diabetes mellitus with other specified complication: Secondary | ICD-10-CM | POA: Diagnosis not present

## 2023-11-11 DIAGNOSIS — E1159 Type 2 diabetes mellitus with other circulatory complications: Secondary | ICD-10-CM | POA: Diagnosis not present

## 2023-11-11 DIAGNOSIS — E669 Obesity, unspecified: Secondary | ICD-10-CM

## 2023-11-11 DIAGNOSIS — R110 Nausea: Secondary | ICD-10-CM

## 2023-11-11 DIAGNOSIS — F3289 Other specified depressive episodes: Secondary | ICD-10-CM | POA: Diagnosis not present

## 2023-11-11 DIAGNOSIS — I152 Hypertension secondary to endocrine disorders: Secondary | ICD-10-CM

## 2023-11-11 DIAGNOSIS — Z7985 Long-term (current) use of injectable non-insulin antidiabetic drugs: Secondary | ICD-10-CM

## 2023-11-11 DIAGNOSIS — Z6841 Body Mass Index (BMI) 40.0 and over, adult: Secondary | ICD-10-CM

## 2023-11-11 MED ORDER — TIRZEPATIDE 15 MG/0.5ML ~~LOC~~ SOAJ
15.0000 mg | SUBCUTANEOUS | 0 refills | Status: DC
  Filled 2023-11-11: qty 6, 84d supply, fill #0

## 2023-11-11 MED ORDER — BUPROPION HCL ER (SR) 200 MG PO TB12
200.0000 mg | ORAL_TABLET | Freq: Every morning | ORAL | 0 refills | Status: DC
Start: 2023-11-11 — End: 2024-02-10
  Filled 2023-11-11 – 2023-12-11 (×2): qty 90, 90d supply, fill #0

## 2023-11-11 MED ORDER — ONDANSETRON 4 MG PO TBDP
4.0000 mg | ORAL_TABLET | Freq: Three times a day (TID) | ORAL | 0 refills | Status: DC | PRN
  Filled 2023-11-11: qty 18, 6d supply, fill #0

## 2023-11-11 NOTE — Progress Notes (Signed)
 SUBJECTIVE: Discussed the use of AI scribe software for clinical note transcription with the patient, who gave verbal consent to proceed.  Chief Complaint: Obesity  Interim History: She is down 6 lbs since last visit. Down 53 lbs overall TBW loss of 16.3%  Brandi Lester is here to discuss her progress with her obesity treatment plan. She is on the Category 4 Plan and states she is following her eating plan approximately 75 % of the time. She states she is exercising walking 30-45 minutes 3 times per week.  The patient is a 58 year old with type two diabetes who presents for follow-up of her obesity treatment plan.  She has lost 53 pounds, with fluctuations in muscle mass and adipose tissue. Last summer, she reached a low of 113 pounds of adipose tissue, coinciding with increased exercise and reduced stress. She is currently focused on maintaining muscle mass during her weight loss journey.  She experiences nausea, particularly on Mondays after taking her medication on Saturdays, and has a gag reflex when taking her morning medication. She is on [name], bupropion, and Zofran. The increase in medication has reduced her appetite, leading her to force herself to eat. She reports gastrointestinal discomfort after consuming fatty foods and protein shakes mixed with milk.  She is actively engaging in physical activities, using ankle and wrist weights for exercise, and is considering using a weighted vest for walking. 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  She is considering joining a gym for the summer.  OBJECTIVE: Visit Diagnoses: Problem List Items Addressed This Visit     Depression   Relevant Medications   buPROPion (WELLBUTRIN SR) 200 MG 12 hr tablet   Hypertension associated with type 2 diabetes mellitus (HCC)   Relevant Medications   tirzepatide ([name]) 15 MG/0.5ML Pen   Diabetes mellitus (HCC) - Primary   Relevant Medications   tirzepatide  ([name]) 15 MG/0.5ML Pen   BMI 40.0-44.9, adult (HCC) Current BMI 40.1   Relevant Medications   tirzepatide ([name]) 15 MG/0.5ML Pen   Obesity (HCC)- Start BMI 47.99   Relevant Medications   tirzepatide ([name]) 15 MG/0.5ML Pen   Other Visit Diagnoses       Nausea       Relevant Medications   ondansetron (ZOFRAN-ODT) 4 MG disintegrating tablet     Obesity She has lost 53 pounds . Medication-induced appetite decrease leads to forced eating, with nausea occurring on Mondays post-medication. Emphasized muscle mass maintenance and discussed adaptive thermogenesis, which complicates weight loss. Plans for metabolic testing to evaluate caloric needs and adjust treatment. Discussed weighted vests and weights for exercise to support muscle maintenance. - Continue [name], bupropion, and Zofran - Schedule metabolic testing next month - Encourage use of weighted vest and weights for exercise - Advise against daily weighing - Monitor for nausea  Type 2 Diabetes Mellitus Current management includes  [name] 15 mg weekly and metformin. Denies mass in neck, dysphagia, dyspepsia, persistent hoarseness, abdominal pain, or N/V/Constipation or diarrhea. Has annual eye exam. Mood is stable. Some nausea initially following injection and takes PRN Zofran and needs refill today.  No GI or other side effects with metformin.  Lab Results  Component Value Date   HGBA1C 5.6 08/05/2023   HGBA1C 5.4 02/19/2023   HGBA1C 5.5 10/22/2022   Lab Results  Component Value Date   MICROALBUR 11.7 (H) 09/09/2023   LDLCALC 74 08/05/2023   CREATININE 0.87 09/09/2023   She is working  on nutrition  plan to decrease simple carbohydrates, increase lean proteins and exercise to promote weight loss and improve glycemic control . Refill/continue [name] 15 mg weekly.  Refill/continue Zofran prn for nausea Continue metformin.  Plan to repeat IC/labs at next OV.   Hypertension Hypertension stable, improved,  reasonably well controlled, and no significant medication side effects noted.  Medication(s): amlodipine 10 mg daily   Losartan 100 mg daily   Carvedilol 25 mg BID   Hydrochlorothiazide 12.5 mg daily   KCL 20 mEq- NEED TO CHECK AND SEE IF TAKING  BP Readings from Last 3 Encounters:  11/11/23 128/81  10/10/23 120/71  10/03/23 (!) 142/79   Lab Results  Component Value Date   CREATININE 0.87 09/09/2023   CREATININE 1.16 08/05/2023   CREATININE 0.73 02/19/2023   Lab Results  Component Value Date   GFR 75.06 09/09/2023   GFR 53.18 (L) 08/05/2023   GFR 93.01 02/19/2023   Lab Results  Component Value Date   NA 139 09/09/2023   CL 102 09/09/2023   K 3.9 09/09/2023   CO2 26 09/09/2023   BUN 9 09/09/2023   CREATININE 0.87 09/09/2023   GFR 75.06 09/09/2023   CALCIUM 9.6 09/09/2023   PHOS 2.5 07/16/2014   ALBUMIN 4.3 08/05/2023   GLUCOSE 114 (H) 09/09/2023     Plan: Continue all antihypertensives at current dosages. Continue to work on nutrition plan to promote weight loss and improve BP control.   Other depression/emotional eating Brandi Lester has had issues with stress/emotional eating. Currently this is well controlled. Overall mood is stable. Medication(s): Bupropion SR 200 mg daily in am  Plan: Continue and refill Bupropion SR 200 mg daily in am  Follow-up Follow-up appointment scheduled to conduct metabolic testing and assess treatment plan. - Schedule follow-up on April 14th at 3:30 PM  Vitals Temp: 98.3 F (36.8 C) BP: 128/81 Pulse Rate: 87 SpO2: 99 %   Anthropometric Measurements Height: 5\' 8"  (1.727 m) Weight: 272 lb (123.4 kg) BMI (Calculated): 41.37 Weight at Last Visit: 278lb Weight Lost Since Last Visit: 6lb Weight Gained Since Last Visit: 0 Starting Weight: 325lb Total Weight Loss (lbs): 53 lb (24 kg) Peak Weight: 330lb   Body Composition  Body Fat %: 50.5 % Fat Mass (lbs): 137.8 lbs Muscle Mass (lbs): 128.2 lbs Total Body Water (lbs):  100.4 lbs Visceral Fat Rating : 16   Other Clinical Data Fasting: no Labs: no Today's Visit #: 103 Starting Date: 05/23/17     ASSESSMENT AND PLAN:  Diet: Brandi Lester is currently in the action stage of change. As such, her goal is to continue with weight loss efforts and has agreed to the Category 4 Plan.   Exercise:  For substantial health benefits, adults should do at least 150 minutes (2 hours and 30 minutes) a week of moderate-intensity, or 75 minutes (1 hour and 15 minutes) a week of vigorous-intensity aerobic physical activity, or an equivalent combination of moderate- and vigorous-intensity aerobic activity. Aerobic activity should be performed in episodes of at least 10 minutes, and preferably, it should be spread throughout the week. and Adults should also include muscle-strengthening activities that involve all major muscle groups on 2 or more days a week.  Behavior Modification:  We discussed the following Behavioral Modification Strategies today: increasing lean protein intake, decreasing simple carbohydrates, increasing vegetables, increase H2O intake, increase high fiber foods, no skipping meals, avoiding temptations, and planning for success. We discussed various medication options to help Brandi Lester with her weight loss efforts and we both  agreed to continue [name] 15 mg weekly for Type 2 diabetes and continue other current medications.  Return in about 4 weeks (around 12/09/2023) for Fasting IC.[name] She was informed of the importance of frequent follow up visits to maximize her success with intensive lifestyle modifications for her multiple health conditions.  Attestation Statements:   Reviewed by clinician on day of visit: allergies, medications, problem list, medical history, surgical history, family history, social history, and previous encounter notes.   Time spent on visit including pre-visit chart review and post-visit care and charting was 37  minutes  Minka Knight,PA-C

## 2023-11-12 ENCOUNTER — Other Ambulatory Visit (HOSPITAL_BASED_OUTPATIENT_CLINIC_OR_DEPARTMENT_OTHER): Payer: Self-pay

## 2023-12-11 ENCOUNTER — Other Ambulatory Visit (HOSPITAL_BASED_OUTPATIENT_CLINIC_OR_DEPARTMENT_OTHER): Payer: Self-pay

## 2023-12-15 NOTE — Progress Notes (Unsigned)
 SUBJECTIVE: Discussed the use of AI scribe software for clinical note transcription with the patient, who gave verbal consent to proceed.  Chief Complaint: Obesity  Interim History: She is down 6 lbs since her last visit Down 59 lbs overall TBW Loss of 18.2%  Brandi Lester is here to discuss her progress with her obesity treatment plan. She is on the Category 4 Plan and states she is following her eating plan approximately 80 % of the time. She states she is exercising walking and exercise bike 45 minutes 3-4 times per week.  Brandi Lester is a 58 year old female with obesity who presents for follow-up of her obesity treatment plan.  Repeat metabolism test, specifically indirect calorimetry, to reassess her resting energy expenditure. The test showed a resting energy expenditure of 1858 calories, a decrease from her previous measurement of 2698 calories in 2020, indicating a difference of 840 calories. She has lost 59 pounds, which is 18.2% of her body weight, and has built 3.2 pounds of muscle mass while losing 9.6 pounds of adipose mass. She is currently on a category four plan and is considering a change to a category three plan to continue promoting gradual weight loss.  She engages in physical activity, including walking and riding an exercise bike for 45 minutes three to four times per week. She recently hiked during a field trip, indicating increased activity levels. Her dietary habits include consuming prepackaged salads for lunch, which has helped her manage her calorie intake effectively. No current issues with her knees or hips that would prevent her from adding ankle weights to her exercise routine. She has allergies but no other significant symptoms.  Her medical history includes type two diabetes, hypertension, hyperlipidemia, vitamin D deficiency, and emotional eating. Her current medications are hydrochlorothiazide, carvedilol, metformin, and Lipitor 10 mg daily. She also takes  over-the-counter vitamin D, 4000 IU every other day. She reported issues with the [facility] 3 continuous glucose monitor, noting it was inaccurate by 30 to 40 points compared to her blood glucose meter.  Her family history includes her sister, who recently had surgery for an aortic aneurysm at the age of 32.  Fasting labs obtained today.  The patient was informed we would discuss the lab results at the next visit unless there is a critical issue that needs to be addressed sooner. The patient agreed to keep the next visit at the agreed upon time to discuss these results.   OBJECTIVE: Visit Diagnoses: Problem List Items Addressed This Visit     Hyperlipidemia associated with type 2 diabetes mellitus (HCC)   Relevant Medications   atorvastatin (LIPITOR) 10 MG tablet   metFORMIN (GLUCOPHAGE) 500 MG tablet   tirzepatide ([name]) 15 MG/0.5ML Pen   Other Relevant Orders   Lipid Panel With LDL/HDL Ratio   Vitamin D deficiency   Relevant Orders   VITAMIN D 25 Hydroxy (Vit-D Deficiency, Fractures)   Depression   Hypertension associated with type 2 diabetes mellitus (HCC)   Relevant Medications   atorvastatin (LIPITOR) 10 MG tablet   metFORMIN (GLUCOPHAGE) 500 MG tablet   tirzepatide ([name]) 15 MG/0.5ML Pen   Diabetes mellitus (HCC)   Relevant Medications   atorvastatin (LIPITOR) 10 MG tablet   metFORMIN (GLUCOPHAGE) 500 MG tablet   tirzepatide ([name]) 15 MG/0.5ML Pen   Other Relevant Orders   CMP14+EGFR   Hemoglobin A1c   Insulin, random   BMI 40.0-44.9, adult (HCC) Current BMI 40.1   Relevant Medications   metFORMIN (GLUCOPHAGE)  500 MG tablet   tirzepatide ([name]) 15 MG/0.5ML Pen   Obesity (HCC)- Start BMI 47.99   Relevant Medications   metFORMIN (GLUCOPHAGE) 500 MG tablet   tirzepatide ([name]) 15 MG/0.5ML Pen   Other Visit Diagnoses       SOBOE (shortness of breath on exertion)    -  Primary   Relevant Orders   Vitamin B12   CBC with Differential/Platelet      SOBOE Repeat metabolism test, specifically indirect calorimetry, to reassess her resting energy expenditure. The test showed a resting energy expenditure of 1858 calories, a decrease from her previous measurement of 2698 calories in 2020, indicating a difference of 840 calories. She has lost 59 pounds, which is 18.2% of her body weight, and has built 3.2 pounds of muscle mass while losing 9.6 pounds of adipose mass. She is currently on a category four plan and we discussed a change to a category three plan to continue promoting gradual weight loss.  Obesity Brandi Lester has successfully lost 59 pounds, equating to 18.2% of her body weight. Her resting energy expenditure decreased from 2698 calories in 2020 to 1858 calories currently, a reduction of 840 calories. She has lost 9.6 pounds of adipose mass and gained 3.2 pounds of muscle mass. She engages in regular physical activity, including walking and riding an exercise bike for 45 minutes, three to four times per week. Transitioning from a category four to a category three plan is recommended to continue promoting gradual weight loss, involving a reduction in daily caloric intake by 300-400 calories, focusing on 1400-1500 calories per day. This adjustment is expected to maintain her current rate of weight loss while accommodating her decreased caloric needs due to weight loss. - Transition to a category three weight loss plan, reducing daily caloric intake to 1400-1500 calories. - Encourage continued physical activity, including walking and biking. - Consider adding ankle weights or arm weights during exercise to increase muscle workload.  Meds ordered this encounter  Medications   atorvastatin (LIPITOR) 10 MG tablet    Sig: Take 1 tablet (10 mg total) by mouth daily.    Dispense:  90 tablet    Refill:  3   metFORMIN (GLUCOPHAGE) 500 MG tablet    Sig: Take 1 tablet (500 mg total) by mouth 2 (two) times daily with a meal.    Dispense:  180  tablet    Refill:  0   tirzepatide ([name]) 15 MG/0.5ML Pen    Sig: Inject 15 mg into the skin once a week.    Dispense:  6 mL    Refill:  0      Type 2 Diabetes Mellitus Brandi is managing her type 2 diabetes with metformin and [name]. The [employer] 3 continuous glucose monitor was found to be inaccurate, with discrepancies of 30-40 points compared to fingerstick readings. The Dexcom is preferred but not covered by insurance. Her A1c will be monitored as part of her lab work to ensure effective management of her diabetes. Lab Results  Component Value Date   HGBA1C 5.6 08/05/2023   HGBA1C 5.4 02/19/2023   HGBA1C 5.5 10/22/2022   Lab Results  Component Value Date   MICROALBUR 11.7 (H) 09/09/2023   LDLCALC 74 08/05/2023   CREATININE 0.87 09/09/2023   INSULIN  Date Value Ref Range Status  12/01/2019 20.7 2.6 - 24.9 uIU/mL Final  ]She is working  on nutrition plan to decrease simple carbohydrates, increase lean proteins and exercise to promote weight loss and improve  glycemic control . - Continue metformin and [name] as prescribed. - Discontinue use of [employer] 3 continuous glucose monitor. - Order A1c test as part of lab work in addition to insulin level and CMET. Meds ordered this encounter  Medications   atorvastatin (LIPITOR) 10 MG tablet    Sig: Take 1 tablet (10 mg total) by mouth daily.    Dispense:  90 tablet    Refill:  3   metFORMIN (GLUCOPHAGE) 500 MG tablet    Sig: Take 1 tablet (500 mg total) by mouth 2 (two) times daily with a meal.    Dispense:  180 tablet    Refill:  0   tirzepatide ([name]) 15 MG/0.5ML Pen    Sig: Inject 15 mg into the skin once a week.    Dispense:  6 mL    Refill:  0    Hypertension Her blood pressure is well-controlled at 102/70 mmHg. Considering reducing medication for HTN based on her current blood pressure readings. Consultation with the cardiologist will be sought if needed to ensure optimal management of her  hypertension. On Carvedilol 25 mg BID Hydrochlorothiazide 12.5 mg daily Losartan 100 mg daily Amlodipine 10 mg daily BP Readings from Last 3 Encounters:  12/16/23 103/71  11/11/23 128/81  10/10/23 120/71   Lab Results  Component Value Date   NA 139 09/09/2023   CL 102 09/09/2023   K 3.9 09/09/2023   CO2 26 09/09/2023   BUN 9 09/09/2023   CREATININE 0.87 09/09/2023   GFR 75.06 09/09/2023   CALCIUM 9.6 09/09/2023   PHOS 2.5 07/16/2014   ALBUMIN 4.3 08/05/2023   GLUCOSE 114 (H) 09/09/2023  - Monitor blood pressure readings. Continue to work on nutrition plan to promote weight loss and improve BP control.  - ? Consider reducing antihypertensive dosage after consulting with cardiologist if remains consistently low.  Hyperlipidemia Brandi is on Lipitor 10 mg daily.  Lab Results  Component Value Date   CHOL 132 08/05/2023   CHOL 114 02/19/2023   CHOL 114 10/22/2022   Lab Results  Component Value Date   HDL 40.90 08/05/2023   HDL 38.40 (L) 02/19/2023   HDL 39.50 10/22/2022   Lab Results  Component Value Date   LDLCALC 74 08/05/2023   LDLCALC 55 02/19/2023   LDLCALC 60 10/22/2022   Lab Results  Component Value Date   TRIG 88.0 08/05/2023   TRIG 104.0 02/19/2023   TRIG 73.0 10/22/2022   Lab Results  Component Value Date   CHOLHDL 3 08/05/2023   CHOLHDL 3 02/19/2023   CHOLHDL 3 10/22/2022   No results found for: "LDLDIRECT"  Emphasis is placed on maintaining an LDL level below 55 mg/dL to reduce the risk of cardiovascular events, as studies show lower LDL levels decrease stroke and heart attack risks. Her HDL is at 40 mg/dL, which is at the lower end of the desired range. Monitoring her lipid levels is crucial, especially with her current medication regimen. - Continue Lipitor 10 mg daily. - Order fasting lipid panel as part of lab work.  Vitamin D Deficiency Her last vitamin D level was 74 ng/mL. She is taking 4000 IU of vitamin D every other day, which is  considered adequate for maintaining her vitamin D levels. No N/V or other side effects with vitamin D OTC.  Last vitamin D Lab Results  Component Value Date   VD25OH 74.02 08/05/2023   Low vitamin D levels can be associated with adiposity and may result in leptin  resistance and weight gain. Also associated with fatigue.  Currently on vitamin D supplementation without any adverse effects such as nausea, vomiting or muscle weakness.  - Continue vitamin D supplementation at 4000 IU every other day. Recheck vitamin D level today.    Other depression/emotional eating Brandi Lester has had issues with stress/emotional eating. Currently this is well controlled. Overall mood is stable. Medication(s): Bupropion SR 200 mg daily in am No side effects with wellbutrin.   Plan: Continue Bupropion SR 200 mg daily in am No refill needed this visit.  Continue to work on emotional eating strategies.   General Health Maintenance Routine lab work is being conducted to monitor her overall health, including liver and kidney function, electrolytes, CBC, A1c, insulin, fasting lipids, vitamin D, B12.. These tests are important for managing her conditions and ensuring her medications do not adversely affect her liver function. - Order lab tests: liver function, kidney function, electrolytes, CBC, A1c, insulin, fasting lipids, vitamin D, B12. - Monitor lab results and adjust treatment as necessary.  Follow-up Brandi has a follow-up appointment scheduled to review lab results and adjust treatment plans as necessary. This will ensure her treatment remains effective and any necessary adjustments are made promptly. - Schedule follow-up appointment on June 9th at 3:30 PM. - Review lab results and adjust treatment plans as necessary.  Vitals Temp: 99.6 F (37.6 C) BP: 103/71 Pulse Rate: 91 SpO2: 95 %   Anthropometric Measurements Height: 5\' 8"  (1.727 m) Weight: 266 lb (120.7 kg) BMI (Calculated): 40.45 Weight  at Last Visit: 272 lb Weight Lost Since Last Visit: 6 lb Weight Gained Since Last Visit: 0 Starting Weight: 325 lb Total Weight Loss (lbs): 59 lb (26.8 kg) Peak Weight: 330 lb   Body Composition  Body Fat %: 48.1 % Fat Mass (lbs): 128.2 lbs Muscle Mass (lbs): 131.4 lbs Total Body Water (lbs): 92.6 lbs Visceral Fat Rating : 15   Other Clinical Data RMR: [id number] Fasting: yes Labs: yes Today's Visit #: 104 Starting Date: 05/23/17     ASSESSMENT AND PLAN:  Diet: Brandi Lester is currently in the action stage of change. As such, her goal is to continue with weight loss efforts. She has agreed to Category 3 Plan.  Exercise: Brandi Lester has been instructed to work up to a goal of 150 minutes of combined cardio and strengthening exercise per week for weight loss and overall health benefits.   Behavior Modification:  We discussed the following Behavioral Modification Strategies today: increasing lean protein intake, decreasing simple carbohydrates, increasing vegetables, increase H2O intake, increase high fiber foods, no skipping meals, meal planning and cooking strategies, avoiding temptations, and planning for success. We discussed various medication options to help Brandi Lester with her weight loss efforts and we both agreed to continue current plan and continue to work on nutritional and behavioral strategies to promote weight loss.  .  Return in about 4 weeks (around 01/13/2024).Brandi Lester She was informed of the importance of frequent follow up visits to maximize her success with intensive lifestyle modifications for her multiple health conditions.  Attestation Statements:   Reviewed by clinician on day of visit: allergies, medications, problem list, medical history, surgical history, family history, social history, and previous encounter notes.   Time spent on visit including pre-visit chart review and post-visit care and charting was 35 minutes.    Sinead Hockman, PA-C'

## 2023-12-16 ENCOUNTER — Encounter (INDEPENDENT_AMBULATORY_CARE_PROVIDER_SITE_OTHER): Payer: Self-pay | Admitting: Physician Assistant

## 2023-12-16 ENCOUNTER — Other Ambulatory Visit (HOSPITAL_BASED_OUTPATIENT_CLINIC_OR_DEPARTMENT_OTHER): Payer: Self-pay

## 2023-12-16 ENCOUNTER — Ambulatory Visit (INDEPENDENT_AMBULATORY_CARE_PROVIDER_SITE_OTHER): Payer: 59 | Admitting: Physician Assistant

## 2023-12-16 VITALS — BP 103/71 | HR 91 | Temp 99.6°F | Ht 68.0 in | Wt 266.0 lb

## 2023-12-16 DIAGNOSIS — E559 Vitamin D deficiency, unspecified: Secondary | ICD-10-CM

## 2023-12-16 DIAGNOSIS — Z7985 Long-term (current) use of injectable non-insulin antidiabetic drugs: Secondary | ICD-10-CM

## 2023-12-16 DIAGNOSIS — E1169 Type 2 diabetes mellitus with other specified complication: Secondary | ICD-10-CM

## 2023-12-16 DIAGNOSIS — F3289 Other specified depressive episodes: Secondary | ICD-10-CM

## 2023-12-16 DIAGNOSIS — G4733 Obstructive sleep apnea (adult) (pediatric): Secondary | ICD-10-CM

## 2023-12-16 DIAGNOSIS — Z6841 Body Mass Index (BMI) 40.0 and over, adult: Secondary | ICD-10-CM

## 2023-12-16 DIAGNOSIS — R0602 Shortness of breath: Secondary | ICD-10-CM | POA: Diagnosis not present

## 2023-12-16 DIAGNOSIS — E785 Hyperlipidemia, unspecified: Secondary | ICD-10-CM | POA: Diagnosis not present

## 2023-12-16 DIAGNOSIS — E669 Obesity, unspecified: Secondary | ICD-10-CM

## 2023-12-16 DIAGNOSIS — I152 Hypertension secondary to endocrine disorders: Secondary | ICD-10-CM

## 2023-12-16 DIAGNOSIS — F5089 Other specified eating disorder: Secondary | ICD-10-CM

## 2023-12-16 DIAGNOSIS — E1159 Type 2 diabetes mellitus with other circulatory complications: Secondary | ICD-10-CM | POA: Diagnosis not present

## 2023-12-16 DIAGNOSIS — Z7984 Long term (current) use of oral hypoglycemic drugs: Secondary | ICD-10-CM

## 2023-12-16 MED ORDER — ATORVASTATIN CALCIUM 10 MG PO TABS
10.0000 mg | ORAL_TABLET | Freq: Every day | ORAL | 3 refills | Status: DC
  Filled 2023-12-16: qty 90, 90d supply, fill #0

## 2023-12-16 MED ORDER — METFORMIN HCL 500 MG PO TABS
500.0000 mg | ORAL_TABLET | Freq: Two times a day (BID) | ORAL | 0 refills | Status: DC
  Filled 2023-12-16 – 2024-02-03 (×2): qty 180, 90d supply, fill #0

## 2023-12-16 MED ORDER — TIRZEPATIDE 15 MG/0.5ML ~~LOC~~ SOAJ
15.0000 mg | SUBCUTANEOUS | 0 refills | Status: DC
Start: 2023-12-16 — End: 2024-02-10
  Filled 2023-12-16 – 2023-12-30 (×2): qty 6, 84d supply, fill #0

## 2023-12-18 LAB — CMP14+EGFR
ALT: 11 IU/L (ref 0–32)
AST: 12 IU/L (ref 0–40)
Albumin: 4.2 g/dL (ref 3.8–4.9)
Alkaline Phosphatase: 91 IU/L (ref 44–121)
BUN/Creatinine Ratio: 9 (ref 9–23)
BUN: 6 mg/dL (ref 6–24)
Bilirubin Total: 0.3 mg/dL (ref 0.0–1.2)
CO2: 19 mmol/L — ABNORMAL LOW (ref 20–29)
Calcium: 9.5 mg/dL (ref 8.7–10.2)
Chloride: 102 mmol/L (ref 96–106)
Creatinine, Ser: 0.67 mg/dL (ref 0.57–1.00)
Globulin, Total: 2.9 g/dL (ref 1.5–4.5)
Glucose: 78 mg/dL (ref 70–99)
Potassium: 3.8 mmol/L (ref 3.5–5.2)
Sodium: 140 mmol/L (ref 134–144)
Total Protein: 7.1 g/dL (ref 6.0–8.5)
eGFR: 103 mL/min/{1.73_m2} (ref >59)

## 2023-12-18 LAB — CBC WITH DIFFERENTIAL/PLATELET
Basophils Absolute: 0.1 10*3/uL (ref 0.0–0.2)
Basos: 1 %
EOS (ABSOLUTE): 0.1 10*3/uL (ref 0.0–0.4)
Eos: 3 %
Hematocrit: 41.1 % (ref 34.0–46.6)
Hemoglobin: 13.2 g/dL (ref 11.1–15.9)
Immature Grans (Abs): 0 10*3/uL (ref 0.0–0.1)
Immature Granulocytes: 0 %
Lymphocytes Absolute: 1.3 10*3/uL (ref 0.7–3.1)
Lymphs: 27 %
MCH: 28.6 pg (ref 26.6–33.0)
MCHC: 32.1 g/dL (ref 31.5–35.7)
MCV: 89 fL (ref 79–97)
Monocytes Absolute: 0.4 10*3/uL (ref 0.1–0.9)
Monocytes: 9 %
Neutrophils Absolute: 2.8 10*3/uL (ref 1.4–7.0)
Neutrophils: 60 %
Platelets: 257 10*3/uL (ref 150–450)
RBC: 4.61 x10E6/uL (ref 3.77–5.28)
RDW: 14.4 % (ref 11.7–15.4)
WBC: 4.7 10*3/uL (ref 3.4–10.8)

## 2023-12-18 LAB — LIPID PANEL WITH LDL/HDL RATIO
Cholesterol, Total: 112 mg/dL (ref 100–199)
HDL: 37 mg/dL — ABNORMAL LOW (ref >39)
LDL Chol Calc (NIH): 58 mg/dL (ref 0–99)
LDL/HDL Ratio: 1.6 ratio (ref 0.0–3.2)
Triglycerides: 88 mg/dL (ref 0–149)
VLDL Cholesterol Cal: 17 mg/dL (ref 5–40)

## 2023-12-18 LAB — VITAMIN B12: Vitamin B-12: 585 pg/mL (ref 232–1245)

## 2023-12-18 LAB — HEMOGLOBIN A1C
Est. average glucose Bld gHb Est-mCnc: 105 mg/dL
Hgb A1c MFr Bld: 5.3 % (ref 4.8–5.6)

## 2023-12-18 LAB — VITAMIN D 25 HYDROXY (VIT D DEFICIENCY, FRACTURES): Vit D, 25-Hydroxy: 31.4 ng/mL (ref 30.0–100.0)

## 2023-12-18 LAB — INSULIN, RANDOM: INSULIN: 8.6 u[IU]/mL (ref 2.6–24.9)

## 2023-12-19 ENCOUNTER — Encounter (INDEPENDENT_AMBULATORY_CARE_PROVIDER_SITE_OTHER): Payer: Self-pay | Admitting: Physician Assistant

## 2023-12-28 ENCOUNTER — Other Ambulatory Visit: Payer: Self-pay | Admitting: Family Medicine

## 2023-12-28 DIAGNOSIS — T7840XA Allergy, unspecified, initial encounter: Secondary | ICD-10-CM

## 2023-12-28 DIAGNOSIS — E876 Hypokalemia: Secondary | ICD-10-CM

## 2023-12-28 DIAGNOSIS — I10 Essential (primary) hypertension: Secondary | ICD-10-CM

## 2023-12-30 ENCOUNTER — Other Ambulatory Visit: Payer: Self-pay

## 2023-12-30 ENCOUNTER — Other Ambulatory Visit (HOSPITAL_BASED_OUTPATIENT_CLINIC_OR_DEPARTMENT_OTHER): Payer: Self-pay

## 2023-12-30 MED ORDER — POTASSIUM CHLORIDE CRYS ER 20 MEQ PO TBCR
20.0000 meq | EXTENDED_RELEASE_TABLET | Freq: Three times a day (TID) | ORAL | 0 refills | Status: DC
  Filled 2023-12-30: qty 270, 90d supply, fill #0

## 2024-01-12 NOTE — Progress Notes (Unsigned)
 SUBJECTIVE: Discussed the use of AI scribe software for clinical note transcription with the patient, who gave verbal consent to proceed.  Chief Complaint: Obesity  Interim History: She is up 1 lb since her last visit.  Down 58 lbs overall TBW loss of 17.8% Brandi Lester is here to discuss her progress with her obesity treatment plan. She is on the Category 3 Plan and states she is following her eating plan approximately 50 % of the time but did get off track over the past 1-2 weeks. She states she is exercising walking/exercise bike 45 minutes 3-4 times per week.  Brandi Lester is a 58 year old female with obesity who presents for follow-up of her obesity treatment plan.  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 Despite this, she exercises regularly, walking or riding an exercise bike for 45 minutes three to four times a week. 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 Despite this, she has maintained a weight loss of 58 pounds overall.  She has type two diabetes, managed with [name]  15 mg weekly and metformin  500 mg twice a day. Her recent lab results show a hemoglobin A1c of 5.3. Her CO2 levels were slightly low at 19, which may be related to metformin  use.  She is on Lipitor 10 mg daily for hypercholesterolemia and requires a refill. Her recent lab results show an LDL cholesterol level of 58, a total cholesterol of 112, and triglycerides at 88.  She has low vitamin D  levels and has increased her supplement intake to 4000 units daily.  She has a history of anemia, which was noted during a consultation for fibroid embolization. Her hemoglobin is now 13.2, and her hematocrit is 41.0. She has been considering treatment options  for fibroids, including embolization and hysterectomy, but is hesitant due to potential risks and her current symptom management. She has follow up planned with GYN.   She experiences occasional sleep disturbances, waking up in the middle of the night but managing to return to sleep within ten minutes. She uses a CPAP mask and has trained herself to keep it on during these episodes. No current symptoms of endometrial cancer or significant menopausal symptoms. She reports occasional hot flashes but is not experiencing severe night sweats or mood disturbances.  OBJECTIVE: Visit Diagnoses: Problem List Items Addressed This Visit     Anemia   Hyperlipidemia associated with type 2 diabetes mellitus (HCC) - Primary   Relevant Medications   atorvastatin  (LIPITOR) 10 MG tablet   Vitamin D  deficiency   Hypertension associated with type 2 diabetes mellitus (HCC)   Relevant Medications   atorvastatin  (LIPITOR) 10 MG tablet   Diabetes mellitus (HCC)   Relevant Medications   atorvastatin  (LIPITOR) 10 MG tablet   BMI 40.0-44.9, adult (HCC) Current BMI 40.1   Obesity (HCC)- Start BMI 47.99  Obesity Weight increased by one pound since last visit. 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 Currently on a category three plan but adherence has been inconsistent. Overall weight loss 58 lbs or 17.8% of total body weight!!! - Continue current exercise regimen of walking or riding exercise bike for 45 minutes 3-4 times a week. - Encourage adherence to dietary plan.   Type  2 diabetes mellitus Diabetes is well-controlled with [name]  15 mg weekly and metformin  500 mg twice daily. Hemoglobin A1c is 5.3, and insulin  levels have improved, indicating reduced insulin  resistance. Considering reducing metformin  to potentially improve CO2 levels. Lab Results  Component Value Date   HGBA1C 5.3 12/16/2023   HGBA1C 5.6 08/05/2023   HGBA1C 5.4 02/19/2023   Lab  Results  Component Value Date   MICROALBUR 11.7 (H) 09/09/2023   LDLCALC 58 12/16/2023   CREATININE 0.67 12/16/2023   INSULIN   Date Value Ref Range Status  12/16/2023 8.6 2.6 - 24.9 uIU/mL Final  ]She is working  on nutrition plan to decrease simple carbohydrates, increase lean proteins and exercise to promote weight loss and improve glycemic control .  - Reduce metformin  to 500 mg once daily and monitor CO2 levels in follow up. - Recheck insulin  and CO2 levels in a couple of months. - Continue [name]  15 mg weekly.   Hypertension Blood pressure is well-controlled. Pulse rate is slightly elevated but not concerning. [name]  may contribute to increased pulse rate- will monitor. BP Readings from Last 3 Encounters:  01/13/24 121/76  12/16/23 103/71  11/11/23 128/81   Lab Results  Component Value Date   NA 140 12/16/2023   CL 102 12/16/2023   K 3.8 12/16/2023   CO2 19 (L) 12/16/2023   BUN 6 12/16/2023   CREATININE 0.67 12/16/2023   EGFR 103 12/16/2023   CALCIUM  9.5 12/16/2023   PHOS 2.5 07/16/2014   ALBUMIN 4.2 12/16/2023   GLUCOSE 78 12/16/2023  Continue current medications-   Carvedilol  25 mg BID (Hydrochlorothiazide  12.5 mg daily) Losartan  100 mg daily Amlodipine  10 mg daily Continue to work on nutrition plan to promote weight loss and improve BP control.   Hypercholesterolemia Hypercholesterolemia is managed with Lipitor 10 mg daily. LDL cholesterol is at goal for a type 2 diabetic at 58. HDL cholesterol remains low, but overall lipid profile is satisfactory. Lab Results  Component Value Date   CHOL 112 12/16/2023   CHOL 132 08/05/2023   CHOL 114 02/19/2023   Lab Results  Component Value Date   HDL 37 (L) 12/16/2023   HDL 40.90 08/05/2023   HDL 38.40 (L) 02/19/2023   Lab Results  Component Value Date   LDLCALC 58 12/16/2023   LDLCALC 74 08/05/2023   LDLCALC 55 02/19/2023   Lab Results  Component Value Date   TRIG 88 12/16/2023   TRIG 88.0 08/05/2023    TRIG 104.0 02/19/2023   Lab Results  Component Value Date   CHOLHDL 3 08/05/2023   CHOLHDL 3 02/19/2023   CHOLHDL 3 10/22/2022   No results found for: "LDLDIRECT" Continue to work on nutrition plan -decreasing simple carbohydrates, increasing lean proteins, decreasing saturated fats and cholesterol , avoiding trans fats and exercise as able to promote weight loss, improve lipids and decrease cardiovascular risks. - Refill Lipitor 10 mg daily. Meds ordered this encounter  Medications   atorvastatin  (LIPITOR) 10 MG tablet    Sig: Take 1 tablet (10 mg total) by mouth daily.    Dispense:  90 tablet    Refill:  3    Low CO2 levels CO2 levels are slightly low, possibly due to metformin  use. Will monitor levels to determine if metformin  reduction improves CO2 levels. - Recheck CO2 levels in a couple of months.  Vitamin D  deficiency Vitamin D  levels are low despite supplementation every other day. She has increased supplementation to daily on her own. - Continue daily vitamin D  supplementation.  Anemia, unspecified Hemoglobin and hematocrit levels have improved and are within normal range. Anemia likely due to chronic blood loss and iron deficiency. Discussed potential need for hysterectomy due to fibroids and chronic blood loss. She has follow up with GYN planned.   Vitals Temp: 98.7 F (37.1 C) BP: 121/76 Pulse Rate: 90 SpO2: 100 %   Anthropometric Measurements Height: 5\' 8"  (1.727 m) Weight: 267 lb (121.1 kg) BMI (Calculated): 40.61 Weight at Last Visit: 266 lb Weight Lost Since Last Visit: 0 Weight Gained Since Last Visit: 1 lb Starting Weight: 325 lb Total Weight Loss (lbs): 58 lb (26.3 kg) Peak Weight: 330 lb   Body Composition  Body Fat %: 49.3 % Fat Mass (lbs): 132 lbs Muscle Mass (lbs): 128.8 lbs Total Body Water (lbs): 100.4 lbs Visceral Fat Rating : 15   Other Clinical Data Fasting: No Labs: No Today's Visit #: 105 Starting Date:  05/23/17     ASSESSMENT AND PLAN:  Diet: Brandi Lester is currently in the action stage of change. As such, her goal is to continue with weight loss efforts. She has agreed to Category 3 Plan.  Exercise: Brandi Lester has been instructed to work up to a goal of 150 minutes of combined cardio and strengthening exercise per week for weight loss and overall health benefits.   Behavior Modification:  We discussed the following Behavioral Modification Strategies today: increasing lean protein intake, decreasing simple carbohydrates, increasing vegetables, increase H2O intake, no skipping meals, avoiding temptations, and planning for success. We discussed various medication options to help Brandi Lester with her weight loss efforts and we both agreed to continue current treatment plan and continue to work on nutritional and behavioral strategies to promote weight loss.  .  Return in about 4 weeks (around 02/10/2024).Aaron Aas She was informed of the importance of frequent follow up visits to maximize her success with intensive lifestyle modifications for her multiple health conditions.  Attestation Statements:   Reviewed by clinician on day of visit: allergies, medications, problem list, medical history, surgical history, family history, social history, and previous encounter notes.   Time spent on visit including pre-visit chart review and post-visit care and charting was 38 minutes.    Abas Leicht, PA-C

## 2024-01-13 ENCOUNTER — Encounter (INDEPENDENT_AMBULATORY_CARE_PROVIDER_SITE_OTHER): Payer: Self-pay | Admitting: Physician Assistant

## 2024-01-13 ENCOUNTER — Other Ambulatory Visit (HOSPITAL_BASED_OUTPATIENT_CLINIC_OR_DEPARTMENT_OTHER): Payer: Self-pay

## 2024-01-13 ENCOUNTER — Ambulatory Visit (INDEPENDENT_AMBULATORY_CARE_PROVIDER_SITE_OTHER): Admitting: Physician Assistant

## 2024-01-13 VITALS — BP 121/76 | HR 90 | Temp 98.7°F | Ht 68.0 in | Wt 267.0 lb

## 2024-01-13 DIAGNOSIS — E785 Hyperlipidemia, unspecified: Secondary | ICD-10-CM | POA: Diagnosis not present

## 2024-01-13 DIAGNOSIS — E1159 Type 2 diabetes mellitus with other circulatory complications: Secondary | ICD-10-CM | POA: Diagnosis not present

## 2024-01-13 DIAGNOSIS — I152 Hypertension secondary to endocrine disorders: Secondary | ICD-10-CM

## 2024-01-13 DIAGNOSIS — Z6841 Body Mass Index (BMI) 40.0 and over, adult: Secondary | ICD-10-CM

## 2024-01-13 DIAGNOSIS — D649 Anemia, unspecified: Secondary | ICD-10-CM

## 2024-01-13 DIAGNOSIS — D500 Iron deficiency anemia secondary to blood loss (chronic): Secondary | ICD-10-CM

## 2024-01-13 DIAGNOSIS — E669 Obesity, unspecified: Secondary | ICD-10-CM

## 2024-01-13 DIAGNOSIS — E1169 Type 2 diabetes mellitus with other specified complication: Secondary | ICD-10-CM

## 2024-01-13 DIAGNOSIS — Z7984 Long term (current) use of oral hypoglycemic drugs: Secondary | ICD-10-CM

## 2024-01-13 DIAGNOSIS — E559 Vitamin D deficiency, unspecified: Secondary | ICD-10-CM

## 2024-01-13 MED ORDER — ATORVASTATIN CALCIUM 10 MG PO TABS
10.0000 mg | ORAL_TABLET | Freq: Every day | ORAL | 3 refills | Status: DC
  Filled 2024-01-13: qty 90, 90d supply, fill #0
  Filled 2024-04-14: qty 90, 90d supply, fill #1
  Filled 2024-07-09: qty 90, 90d supply, fill #2

## 2024-01-29 ENCOUNTER — Other Ambulatory Visit: Payer: Self-pay | Admitting: Interventional Radiology

## 2024-01-29 DIAGNOSIS — D250 Submucous leiomyoma of uterus: Secondary | ICD-10-CM

## 2024-01-29 DIAGNOSIS — N939 Abnormal uterine and vaginal bleeding, unspecified: Secondary | ICD-10-CM

## 2024-02-03 ENCOUNTER — Other Ambulatory Visit (HOSPITAL_BASED_OUTPATIENT_CLINIC_OR_DEPARTMENT_OTHER): Payer: Self-pay

## 2024-02-03 ENCOUNTER — Other Ambulatory Visit: Payer: Self-pay | Admitting: Family Medicine

## 2024-02-03 MED ORDER — HYDROCHLOROTHIAZIDE 12.5 MG PO CAPS
12.5000 mg | ORAL_CAPSULE | Freq: Every day | ORAL | 0 refills | Status: DC
Start: 2024-02-03 — End: 2024-05-05
  Filled 2024-02-03: qty 90, 90d supply, fill #0

## 2024-02-08 ENCOUNTER — Ambulatory Visit
Admission: RE | Admit: 2024-02-08 | Discharge: 2024-02-08 | Disposition: A | Discharge status: Home / Self Care | Payer: Self-pay | Source: Ambulatory Visit | Attending: Internal Medicine | Admitting: Internal Medicine

## 2024-02-08 VITALS — BP 142/88 | HR 99 | Temp 99.0°F | Resp 18 | Wt 267.0 lb

## 2024-02-08 DIAGNOSIS — J038 Acute tonsillitis due to other specified organisms: Secondary | ICD-10-CM | POA: Diagnosis not present

## 2024-02-08 LAB — POCT RAPID STREP A (OFFICE): Rapid Strep A Screen: NEGATIVE

## 2024-02-08 MED ORDER — AZITHROMYCIN 250 MG PO TABS: ORAL_TABLET | ORAL | 0 refills | Status: DC

## 2024-02-08 NOTE — ED Provider Notes (Signed)
 [facility]    CSN: [id number] Arrival date & time: 02/08/24  1038      History   Chief Complaint Chief Complaint  Patient presents with   Sore Throat    Possible strep throat. - Entered by patient    HPI Corinthia Helmers is a 58 y.o. female.   58 year old female who presents urgent care with complaints of sore throat and difficulty swallowing.  She has had some cold-like symptoms for several days but yesterday she started noting a sore throat and itchiness in her ears.  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  She was concerned she might have strep throat.  She denies fevers, chills, nausea, vomiting, abdominal pain.  She is having some congestion and a mild cough that is usually from drainage in the back of her throat at times.   Sore Throat Pertinent negatives include no chest pain, no abdominal pain and no shortness of breath.    Past Medical History:  Diagnosis Date   ADD (attention deficit disorder)    Allergy     allergic rhinitis   Anemia 07/25/2013   Angio-edema    Anxiety    Arthritis    not dx'd   Back pain    Costochondritis 02/20/2015   Diabetes mellitus    Type II   previously 3 years ago - no longer on meds    Dyslipidemia 07/25/2013   Fungus infection 06/2012   Left great toe   GERD (gastroesophageal reflux disease)    Hypertension    IBS (irritable bowel syndrome)    Ingrown nail 06/2012   right foot next to the last toe   Insomnia 04/20/2013   Joint pain    Lactose intolerance    Leg edema    Obesity    Pedal edema 02/20/2015   Prediabetes    Preventative health care 09/25/2015   PVC (premature ventricular contraction)    Recurrent upper respiratory infection (URI)    Urticaria     Patient Active Problem List   Diagnosis Date Noted   Obesity (HCC)- Start BMI 47.99 08/07/2023   OSA (obstructive sleep apnea) 05/27/2023   BMI 40.0-44.9, adult (HCC) Current BMI 40.1  03/11/2023   Disordered sleep 12/12/2022   Diabetes mellitus (HCC) 11/14/2022   Fatigue 10/28/2022   Seasonal and perennial allergic rhinitis 10/08/2022   Bilateral lower extremity edema 05/17/2022   At risk for nausea 02/10/2022   Chronic rhinitis 01/12/2022   PAC (premature atrial contraction) 01/16/2021   Right elbow pain 12/05/2020   Palpitations 10/19/2020   Chest pain of uncertain etiology 10/19/2020   PVC (premature ventricular contraction)    Lactose intolerance    Joint pain    IBS (irritable bowel syndrome)    Hypertension associated with type 2 diabetes mellitus (HCC)    Back pain    Arthritis    Anxiety    Angio-edema    Allergy     ADD (attention deficit disorder)    High serum vitamin B12 06/01/2020   Low level of high density lipoprotein (HDL) 07/08/2019   Fibroid 69/62/9528   Depression 06/26/2018   Hypertriglyceridemia 09/12/2017   Vitamin D  deficiency 07/02/2017   Hypokalemia 09/25/2015   Recurrent sinusitis 09/25/2015   Preventative health care 09/25/2015   Pedal edema 02/20/2015   Gastro-esophageal reflux 11/15/2013   Anemia 07/25/2013   Hyperlipidemia associated with type 2 diabetes mellitus (HCC) 07/25/2013   Acute non-recurrent pansinusitis  07/03/2013   Insomnia 04/20/2013   Musculoskeletal pain 12/07/2011   Type 2 diabetes mellitus with obesity (HCC) 11/26/2008   HEMORRHOIDS, EXTERNAL 03/25/2008   PREMATURE VENTRICULAR CONTRACTIONS 03/26/2007   Allergic rhinitis 03/26/2007    Past Surgical History:  Procedure Laterality Date   HYSTEROSCOPY N/A 10/04/2015   Procedure: HYSTEROSCOPY with Removal IUD;  Surgeon: Reggy Capers, MD;  Location: [facility];  Service: Gynecology;  Laterality: N/A;   IR RADIOLOGIST EVAL & MGMT  10/03/2023   lasik     eye surgery   WISDOM TOOTH EXTRACTION      OB History     Gravida  0   Para  0   Term  0   Preterm  0   AB  0   Living  0      SAB  0   IAB  0   Ectopic  0   Multiple  0   Live Births  0             Home Medications    Prior to Admission medications   Medication Sig Start Date End Date Taking? Authorizing Provider  azithromycin  (ZITHROMAX ) 250 MG tablet Take first 2 tablets together, then 1 every day until finished. 02/08/24  Yes Abbe Bula A, PA-C  acetaminophen  (TYLENOL  8 HOUR) 650 MG CR tablet Take 1 tablet (650 mg total) every 8 (eight) hours as needed by mouth for pain. 07/18/17   Jerrye Mori, PA-C  amLODipine  (NORVASC ) 10 MG tablet Take 1 tablet (10 mg total) by mouth daily. 09/03/23  Yes Tobb, Kardie, DO  atorvastatin  (LIPITOR) 10 MG tablet Take 1 tablet (10 mg total) by mouth daily. 01/13/24  Yes Rayburn, Evangelina Hilt, PA-C  azelastine  (ASTELIN ) 0.1 % nasal spray Place 1 spray into both nostrils 2 (two) times daily. Use in each nostril as directed 09/19/23   Crecencio Dodge, Candida Chalk, DO  benzonatate  (TESSALON  PERLES) 100 MG capsule Take 1-2 capsules (100-200 mg total) by mouth 3 (three) times daily as needed. 09/19/23   Estill Hemming, DO  buPROPion  (WELLBUTRIN  SR) 200 MG 12 hr tablet Take 1 tablet (200 mg total) by mouth in the morning. 11/11/23   Rayburn, Evangelina Hilt, PA-C  Carbinoxamine  Maleate 4 MG TABS Take 1 tablet (4 mg total) by mouth 4 (four) times daily as needed. 10/08/22   Ardie Kras, FNP  carvedilol  (COREG ) 25 MG tablet Take 1 tablet (25 mg total) by mouth 2 (two) times daily. 07/29/23  Yes Tobb, Kardie, DO  cholecalciferol (VITAMIN D3) 25 MCG (1000 UT) tablet Take 4,000 Units by mouth daily.    [provider]  EPINEPHrine  (EPIPEN  2-PAK) 0.3 mg/0.3 mL IJ SOAJ injection Use as directed for severe allergic reaction 01/11/22   Rochester Chuck, MD  Fluticasone  Propionate (XHANCE ) 93 MCG/ACT EXHU Place 2 sprays into both nostrils 2 (two) times daily for nasal congestion. 09/19/23   Estill Hemming, DO  hydrochlorothiazide  (MICROZIDE ) 12.5 MG capsule Take 1 capsule (12.5 mg total) by mouth daily. 02/03/24   Neda Balk, MD   ibuprofen  (ADVIL ,MOTRIN ) 600 MG tablet Take 1 tablet (600 mg total) by mouth every 6 (six) hours as needed. 10/04/15   Reggy Capers, MD  losartan  (COZAAR ) 100 MG tablet Take 1 tablet (100 mg total) by mouth daily. 08/20/23   Tobb, Kardie, DO  medroxyPROGESTERone  (PROVERA ) 10 MG tablet Take 1 tablet (10 mg total) by mouth daily. 06/03/23   Lacey Pian, MD  metFORMIN  (GLUCOPHAGE )  500 MG tablet Take 1 tablet (500 mg total) by mouth 2 (two) times daily with a meal. 12/16/23 05/04/24  Rayburn, Evangelina Hilt, PA-C  ondansetron  (ZOFRAN -ODT) 4 MG disintegrating tablet Dissolve 1 tablet (4 mg total) by mouth every 8 (eight) hours as needed for nausea or vomiting. 11/11/23   Rayburn, Evangelina Hilt, PA-C  pantoprazole  (PROTONIX ) 40 MG tablet Take 1 tablet (40 mg total) by mouth daily. 09/12/23   Neda Balk, MD  potassium chloride  SA (KLOR-CON  M) 20 MEQ tablet Take 1 tablet (20 mEq total) by mouth 3 (three) times daily. 12/30/23 12/29/24  Neda Balk, MD  tirzepatide  ([name] ) 15 MG/0.5ML Pen Inject 15 mg into the skin once a week. 12/16/23   Rayburn, Evangelina Hilt, PA-C    Family History Family History  Problem Relation Age of Onset   Allergic rhinitis Mother    Heart attack Mother 18   Hypertension Mother    Heart disease Mother    Obesity Mother    Cancer Father        prostate   Prostate cancer Father    Allergic rhinitis Sister    Heart attack Sister 20   Allergic rhinitis Sister    Allergic rhinitis Sister    Allergic rhinitis Maternal Grandmother    Hypertension Other    Colon cancer Neg Hx    Colon polyps Neg Hx    Esophageal cancer Neg Hx    Stomach cancer Neg Hx    Rectal cancer Neg Hx     Social History Social History   Tobacco Use   Smoking status: Former    Current packs/day: 0.00    Average packs/day: 1 pack/day for 15.0 years (15.0 ttl pk-yrs)    Types: Cigarettes    Start date: 09/03/1986    Quit date: 09/03/2001    Years since quitting: 22.4    Passive  exposure: Never   Smokeless tobacco: Never  Vaping Use   Vaping status: Never Used  Substance Use Topics   Alcohol use: Yes    Comment: rare   Drug use: No     Allergies   Penicillins and Victoza [liraglutide]   Review of Systems Review of Systems  Constitutional:  Negative for chills and fever.  HENT:  Negative for ear pain and sore throat.   Eyes:  Negative for pain and visual disturbance.  Respiratory:  Negative for cough and shortness of breath.   Cardiovascular:  Negative for chest pain and palpitations.  Gastrointestinal:  Negative for abdominal pain and vomiting.  Genitourinary:  Negative for dysuria and hematuria.  Musculoskeletal:  Negative for arthralgias and back pain.  Skin:  Negative for color change and rash.  Neurological:  Negative for seizures and syncope.  All other systems reviewed and are negative.    Physical Exam Triage Vital Signs ED Triage Vitals  Encounter Vitals Group     BP 02/08/24 1059 (!) 142/88     Systolic BP Percentile --      Diastolic BP Percentile --      Pulse Rate 02/08/24 1059 99     Resp 02/08/24 1059 18     Temp 02/08/24 1059 99 F (37.2 C)     Temp Source 02/08/24 1059 Oral     SpO2 02/08/24 1059 98 %     Weight 02/08/24 1058 266 lb 15.6 oz (121.1 kg)     Height --      Head Circumference --      Peak Flow --  Pain Score 02/08/24 1057 7     Pain Loc --      Pain Education --      Exclude from Growth Chart --    No data found.  Updated Vital Signs BP (!) 142/88 (BP Location: Left Arm)   Pulse 99   Temp 99 F (37.2 C) (Oral)   Resp 18   Wt 266 lb 15.6 oz (121.1 kg)   LMP 01/23/2024 (Approximate)   SpO2 98%   BMI 40.59 kg/m   Visual Acuity Right Eye Distance:   Left Eye Distance:   Bilateral Distance:    Right Eye Near:   Left Eye Near:    Bilateral Near:     Physical Exam Vitals and nursing note reviewed.  Constitutional:      General: She is not in acute distress.    Appearance: She is  well-developed.  HENT:     Head: Normocephalic and atraumatic.     Right Ear: Tympanic membrane normal.     Left Ear: Tympanic membrane normal.     Nose: Congestion present.     Mouth/Throat:     Mouth: Mucous membranes are moist.     Pharynx: Posterior oropharyngeal erythema present.     Tonsils: Tonsillar exudate present. 2+ on the right. 2+ on the left.  Eyes:     Conjunctiva/sclera: Conjunctivae normal.  Cardiovascular:     Rate and Rhythm: Normal rate and regular rhythm.     Heart sounds: No murmur heard. Pulmonary:     Effort: Pulmonary effort is normal. No respiratory distress.     Breath sounds: Normal breath sounds.  Abdominal:     Palpations: Abdomen is soft.     Tenderness: There is no abdominal tenderness.  Musculoskeletal:        General: No swelling.     Cervical back: Neck supple.  Skin:    General: Skin is warm and dry.     Capillary Refill: Capillary refill takes less than 2 seconds.  Neurological:     Mental Status: She is alert.  Psychiatric:        Mood and Affect: Mood normal.      UC Treatments / Results  Labs (all labs ordered are listed, but only abnormal results are displayed) Labs Reviewed  POCT RAPID STREP A (OFFICE) - Normal    EKG   Radiology No results found.  Procedures Procedures (including critical care time)  Medications Ordered in UC Medications - No data to display  Initial Impression / Assessment and Plan / UC Course  I have reviewed the triage vital signs and the nursing notes.  Pertinent labs & imaging results that were available during my care of the patient were reviewed by me and considered in my medical decision making (see chart for details).     Acute tonsillitis due to other specified organisms  Strep testing done today was negative however on physical exam there is significant swelling of the tonsils along with erythema and exudate especially on the right.  Given this we will treat with antibiotic therapy.   We have treated with the following: Azithromycin  250mg  Take 2 tablets today and the 1 tablet daily for 4 more days.  Rest and stay hydrated.   Tylenol  for fever/chills Can use throat lozenges Return to urgent care or PCP if symptoms worsen or fail to resolve.     Final Clinical Impressions(s) / UC Diagnoses   Final diagnoses:  Acute tonsillitis due to other specified organisms  Discharge Instructions      Strep testing is negative today but physical exam does show swollen, redness and drainage from the tonsils especially the right tonsil. This is an infection that will need antibiotic treatment. We will treat with the following:  Azithromycin  250mg  Take 2 tablets today and the 1 tablet daily for 4 more days.  Rest and stay hydrated.  Tylenol  for pain/fever. Can use throat lozenges.  Return to urgent care or PCP if symptoms worsen or fail to resolve.    ED Prescriptions     Medication Sig Dispense Auth. Provider   azithromycin  (ZITHROMAX ) 250 MG tablet Take first 2 tablets together, then 1 every day until finished. 6 tablet Kreg Pesa, PA-C      PDMP not reviewed this encounter.   Kreg Pesa, [place] 02/08/24 1131

## 2024-02-08 NOTE — ED Triage Notes (Signed)
 Possible strep throat. - Entered by patient  Pt p;resents with sore throat x 2 days. 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 Pt also c/o slight otalgia and itching in throat.

## 2024-02-08 NOTE — Discharge Instructions (Addendum)
 Strep testing is negative today but physical exam does show swollen, redness and drainage from the tonsils especially the right tonsil. This is an infection that will need antibiotic treatment. We will treat with the following:  Azithromycin  250mg  Take 2 tablets today and the 1 tablet daily for 4 more days.  Rest and stay hydrated.  Tylenol  for pain/fever. Can use throat lozenges.  Return to urgent care or PCP if symptoms worsen or fail to resolve.

## 2024-02-09 NOTE — Progress Notes (Unsigned)
 SUBJECTIVE: Discussed the use of AI scribe software for clinical note transcription with the patient, who gave verbal consent to proceed.  Chief Complaint: Obesity  Interim History: She is up 5 lbs since last visit. Down 53 lbs overall TBW loss of 16.3%  Brandi Lester is here to discuss her progress with her obesity treatment plan. She is on the Category 3 Plan and states she is following her eating plan approximately 80 % of the time. She states she is exercising 30-45 minutes 3 times per week. Brandi Lester is a 58 year old female with obesity who presents for a follow-up on her obesity treatment plan.  She has gained five pounds since her last visit but remains 53 pounds down overall. She engages in physical activities such as walking and playing kickball and plans to increase her exercise over the summer. Her diet includes pre-prepared salads and small portions of chicken and potato bites, with occasional indulgences like cupcakes.  She has a history of type 2 diabetes and is currently taking [name]  15 mg weekly and metformin . She maintains regular bowel movements and drinks plenty of water to counteract potential constipation from the medication. She is scheduled for fasting labs on July 7th.  She monitors her hypertension regularly and notes that her blood pressure readings are often higher outside of her regular medical visits, possibly due to pain or other factors.  She is aware of her hyperlipidemia and her cholesterol levels. Her overall cholesterol is low, and her LDL cholesterol is near the target for diabetics, though her HDL cholesterol is below the desired level.  She recently experienced a sore throat and was diagnosed with tonsillitis. She was prescribed a Z-Pak due to her penicillin allergy . The pain has subsided, but a blister remains in her throat. She uses cough drops for relief.  She is planning a potential trip to [place]  with her sister at the end of July and  is considering working to save money for the trip.  OBJECTIVE: Visit Diagnoses: Problem List Items Addressed This Visit     Hyperlipidemia associated with type 2 diabetes mellitus (HCC)   Relevant Medications   tirzepatide  ([name] ) 15 MG/0.5ML Pen   Vitamin D  deficiency   Depression   Relevant Medications   buPROPion  (WELLBUTRIN  SR) 200 MG 12 hr tablet   Hypertension associated with type 2 diabetes mellitus (HCC)   Relevant Medications   tirzepatide  ([name] ) 15 MG/0.5ML Pen   Diabetes mellitus (HCC) - Primary   Relevant Medications   tirzepatide  ([name] ) 15 MG/0.5ML Pen   BMI 40.0-44.9, adult (HCC) Current BMI 40.1   Relevant Medications   tirzepatide  ([name] ) 15 MG/0.5ML Pen   Obesity (HCC)- Start BMI 47.99   Relevant Medications   tirzepatide  ([name] ) 15 MG/0.5ML Pen  Obesity She has gained 5 pounds since the last visit, but remains 53 pounds down overall. The increase in weight may be attributed to water retention and recent illness. The visceral fat rating is at 16, with a goal of 12. She is consuming 1500-1600 calories and 100 grams of protein daily, but has had dietary lapses due to end-of-year celebrations and recent illness. - Increase exercise, focusing on strengthening. - Maintain a daily intake of 1500-1600 calories and 100 grams of protein. - Monitor weight and visceral fat rating. - Avoid liquid calories and maintain hydration.  Type 2 Diabetes Mellitus She is currently on  [name]  15 mg weekly and metformin  and managing her diabetes with dietary modifications. Her A1c is well-controlled. There is  a  concern about CO2 levels being slightly down, possibly related to metformin  use. She is advised to stay hydrated, especially during the summer months. Discussion included the potential need to stop metformin  if CO2 levels remain low. Lab Results  Component Value Date   HGBA1C 5.3 12/16/2023   HGBA1C 5.6 08/05/2023   HGBA1C 5.4 02/19/2023   Lab Results   Component Value Date   MICROALBUR 11.7 (H) 09/09/2023   LDLCALC 58 12/16/2023   CREATININE 0.67 12/16/2023   Continue/refill [name]  15 mg weekly - Continue metformin  500 mg BID- No refill needed this visit. . - Recheck CO2 levels on 03/09/2024. - Check fasting insulin  and A1c on 03/09/2024. - Stay hydrated. -She is working  on nutrition plan to decrease simple carbohydrates, increase lean proteins and exercise to promote weight loss and improve glycemic control .  Hypertension Blood pressure is well-controlled during the visit, though she reports higher readings in other settings, possibly due to pain or other factors. BP Readings from Last 3 Encounters:  02/10/24 127/80  02/08/24 (!) 142/88  01/13/24 121/76   Continue amlodipine  10 mg daily Continue losartan  100 mg daily Continue carvedilol  25 mg BID Continue hydrochlorothiazide  12.5 mg daily Continue KCL 20 mEq TID Continue to work on nutrition plan to promote weight loss and improve BP control.    Hyperlipidemia Her overall cholesterol is 112, and LDL cholesterol is 58, which is near the goal of 55 for diabetics. HDL cholesterol is slightly low, but there are no immediate concerns. On atorvastatin  10 mg daily. No SE Lab Results  Component Value Date   CHOL 112 12/16/2023   HDL 37 (L) 12/16/2023   LDLCALC 58 12/16/2023   TRIG 88 12/16/2023   CHOLHDL 3 08/05/2023   -Continue atorvastatin  Continue to work on nutrition plan -decreasing simple carbohydrates, increasing lean proteins, decreasing saturated fats and cholesterol , avoiding trans fats and exercise as able to promote weight loss, improve lipids and decrease cardiovascular risks.   Vitamin D  Deficiency She has been taking vitamin D  supplements OTC 4000 units daily instead of every other day, which has improved her levels. No N/V or muscle weakness with OTC vitamin D .  Last vitamin D  Lab Results  Component Value Date   VD25OH 31.4 12/16/2023   Low vitamin D   levels can be associated with adiposity and may result in leptin resistance and weight gain. Also associated with fatigue.  Currently on vitamin D  supplementation without any adverse effects such as nausea, vomiting or muscle weakness.  Continue OTC vitamin D  4000 units daily  - Check vitamin D  levels on 03/09/2024.  Tonsillitis She was diagnosed with tonsillitis and was prescribed a Z-Pak due to a penicillin allergy . She reports a persistent blister in the throat, though pain has subsided. Discussion included the possibility of a false negative strep test. - Gargle with warm salt water. - Complete the Z-Pak course.  Other depression/emotional eating Brandi Lester has had issues with stress/emotional eating. Currently this is moderately controlled. Overall mood is stable. Medication(s): Bupropion  SR 200 mg daily in am  Plan: Continue and refill Bupropion  SR 200 mg daily in am  Vitals Temp: 99 F (37.2 C) BP: 127/80 Pulse Rate: 88 SpO2: 97 %   Anthropometric Measurements Height: 5\' 8"  (1.727 m) Weight: 272 lb (123.4 kg) BMI (Calculated): 41.37 Weight at Last Visit: 267lb Weight Lost Since Last Visit: 0 Weight Gained Since Last Visit: 5lb Starting Weight: 325lb Total Weight Loss (lbs): 53 lb (24 kg) Peak Weight: 330lb  Body Composition  Body Fat %: 50.3 % Fat Mass (lbs): 137 lbs Muscle Mass (lbs): 128.6 lbs Total Body Water (lbs): 102.6 lbs Visceral Fat Rating : 16   Other Clinical Data Fasting: no Labs: no Today's Visit #: 106 Starting Date: 05/23/17     ASSESSMENT AND PLAN:  Diet: Brandi Lester is currently in the action stage of change. As such, her goal is to continue with weight loss efforts. She has agreed to Category 3 Plan.  Exercise: Brandi Lester has been instructed to work up to a goal of 150 minutes of combined cardio and strengthening exercise per week for weight loss and overall health benefits.   Behavior Modification:  We discussed the following  Behavioral Modification Strategies today: increasing lean protein intake, decreasing simple carbohydrates, increasing vegetables, increase H2O intake, increase high fiber foods, no skipping meals, meal planning and cooking strategies, emotional eating strategies , avoiding temptations, and planning for success. We discussed various medication options to help Brandi Lester with her weight loss efforts and we both agreed to continue current treatment plan, continue to work on nutritional and behavioral strategies to promote weight loss.  .  Return in about 4 weeks (around 03/09/2024).Aaron Aas She was informed of the importance of frequent follow up visits to maximize her success with intensive lifestyle modifications for her multiple health conditions.  Attestation Statements:   Reviewed by clinician on day of visit: allergies, medications, problem list, medical history, surgical history, family history, social history, and previous encounter notes.   Time spent on visit including pre-visit chart review and post-visit care and charting was 36 minutes.    Corneluis Allston, PA-C

## 2024-02-10 ENCOUNTER — Ambulatory Visit (INDEPENDENT_AMBULATORY_CARE_PROVIDER_SITE_OTHER): Admitting: Physician Assistant

## 2024-02-10 ENCOUNTER — Encounter (INDEPENDENT_AMBULATORY_CARE_PROVIDER_SITE_OTHER): Payer: Self-pay | Admitting: Physician Assistant

## 2024-02-10 ENCOUNTER — Other Ambulatory Visit (HOSPITAL_BASED_OUTPATIENT_CLINIC_OR_DEPARTMENT_OTHER): Payer: Self-pay

## 2024-02-10 VITALS — BP 127/80 | HR 88 | Temp 99.0°F | Ht 68.0 in | Wt 272.0 lb

## 2024-02-10 DIAGNOSIS — I152 Hypertension secondary to endocrine disorders: Secondary | ICD-10-CM | POA: Diagnosis not present

## 2024-02-10 DIAGNOSIS — E785 Hyperlipidemia, unspecified: Secondary | ICD-10-CM | POA: Diagnosis not present

## 2024-02-10 DIAGNOSIS — Z7985 Long-term (current) use of injectable non-insulin antidiabetic drugs: Secondary | ICD-10-CM

## 2024-02-10 DIAGNOSIS — E1169 Type 2 diabetes mellitus with other specified complication: Secondary | ICD-10-CM

## 2024-02-10 DIAGNOSIS — E1159 Type 2 diabetes mellitus with other circulatory complications: Secondary | ICD-10-CM | POA: Diagnosis not present

## 2024-02-10 DIAGNOSIS — E559 Vitamin D deficiency, unspecified: Secondary | ICD-10-CM

## 2024-02-10 DIAGNOSIS — F5089 Other specified eating disorder: Secondary | ICD-10-CM

## 2024-02-10 DIAGNOSIS — F32A Depression, unspecified: Secondary | ICD-10-CM

## 2024-02-10 DIAGNOSIS — F3289 Other specified depressive episodes: Secondary | ICD-10-CM

## 2024-02-10 DIAGNOSIS — Z6841 Body Mass Index (BMI) 40.0 and over, adult: Secondary | ICD-10-CM

## 2024-02-10 DIAGNOSIS — G4733 Obstructive sleep apnea (adult) (pediatric): Secondary | ICD-10-CM

## 2024-02-10 DIAGNOSIS — E669 Obesity, unspecified: Secondary | ICD-10-CM

## 2024-02-10 DIAGNOSIS — D500 Iron deficiency anemia secondary to blood loss (chronic): Secondary | ICD-10-CM

## 2024-02-10 MED ORDER — BUPROPION HCL ER (SR) 200 MG PO TB12
200.0000 mg | ORAL_TABLET | Freq: Every morning | ORAL | 0 refills | Status: DC
  Filled 2024-02-10 – 2024-03-09 (×2): qty 90, 90d supply, fill #0

## 2024-02-10 MED ORDER — TIRZEPATIDE 15 MG/0.5ML ~~LOC~~ SOAJ
15.0000 mg | SUBCUTANEOUS | 0 refills | Status: DC
  Filled 2024-02-10 – 2024-03-09 (×2): qty 6, 84d supply, fill #0

## 2024-02-19 NOTE — Assessment & Plan Note (Signed)
 Patient encouraged to maintain heart healthy diet, regular exercise, adequate sleep. Consider daily probiotics. Take medications as prescribed. Labs ordered and reviewed. Follows with OB for paps and MGMs.  Colonoscopy was 2021 repeat in 10 years. Given and reviewed copy of ACP documents from [employer] and encouraged to complete and return

## 2024-02-19 NOTE — Assessment & Plan Note (Signed)
 Encouraged DASH or MIND diet, decrease po intake and increase exercise as tolerated. Needs 7-8 hours of sleep nightly. Avoid trans fats, eat small, frequent meals every 4-5 hours with lean proteins, complex carbs and healthy fats. Minimize simple carbs, high fat foods and processed foods

## 2024-02-19 NOTE — Assessment & Plan Note (Signed)
 Encourage heart healthy diet such as MIND or DASH diet, increase exercise, avoid trans fats, simple carbohydrates and processed foods, consider a krill or fish or flaxseed oil cap daily. Tolerating Atorvastatin

## 2024-02-19 NOTE — Assessment & Plan Note (Signed)
 Supplement and monitor

## 2024-02-19 NOTE — Assessment & Plan Note (Signed)
 hgba1c acceptable, minimize simple carbs. Increase exercise as tolerated. Continue current meds

## 2024-02-20 ENCOUNTER — Encounter: Payer: Self-pay | Admitting: Family Medicine

## 2024-02-20 ENCOUNTER — Ambulatory Visit (INDEPENDENT_AMBULATORY_CARE_PROVIDER_SITE_OTHER): Payer: BC Managed Care – PPO | Admitting: Family Medicine

## 2024-02-20 ENCOUNTER — Other Ambulatory Visit: Payer: Self-pay

## 2024-02-20 VITALS — BP 128/78 | HR 86 | Resp 16 | Ht 68.0 in | Wt 277.0 lb

## 2024-02-20 DIAGNOSIS — E559 Vitamin D deficiency, unspecified: Secondary | ICD-10-CM | POA: Diagnosis not present

## 2024-02-20 DIAGNOSIS — E1169 Type 2 diabetes mellitus with other specified complication: Secondary | ICD-10-CM

## 2024-02-20 DIAGNOSIS — Z0000 Encounter for general adult medical examination without abnormal findings: Secondary | ICD-10-CM

## 2024-02-20 DIAGNOSIS — E785 Hyperlipidemia, unspecified: Secondary | ICD-10-CM

## 2024-02-20 DIAGNOSIS — Z6841 Body Mass Index (BMI) 40.0 and over, adult: Secondary | ICD-10-CM

## 2024-02-20 DIAGNOSIS — Z7985 Long-term (current) use of injectable non-insulin antidiabetic drugs: Secondary | ICD-10-CM

## 2024-02-20 DIAGNOSIS — Z7984 Long term (current) use of oral hypoglycemic drugs: Secondary | ICD-10-CM

## 2024-02-20 DIAGNOSIS — G4733 Obstructive sleep apnea (adult) (pediatric): Secondary | ICD-10-CM

## 2024-02-20 DIAGNOSIS — E1165 Type 2 diabetes mellitus with hyperglycemia: Secondary | ICD-10-CM | POA: Diagnosis not present

## 2024-02-20 DIAGNOSIS — D219 Benign neoplasm of connective and other soft tissue, unspecified: Secondary | ICD-10-CM

## 2024-02-20 MED ORDER — PSYLLIUM 400 MG PO CAPS: 2.0000 | ORAL_CAPSULE | Freq: Every day | ORAL | Status: AC

## 2024-02-20 NOTE — Assessment & Plan Note (Signed)
 Was supposed to have a fibroid embolization but she cancelled her symptoms are improving she is going to hold off unless symptoms worsen

## 2024-02-20 NOTE — Progress Notes (Unsigned)
 Subjective:    Patient ID: Brandi Lester, female    DOB: June 30, 1966, 58 y.o.   MRN: [id number]  No chief complaint on file.   HPI Discussed the use of AI scribe software for clinical note transcription with the patient, who gave verbal consent to proceed.  History of Present Illness Brandi Lester is a 58 year old female with type 2 diabetes who presents for a follow-up visit.  She continues to manage her type 2 diabetes with metformin , now reduced to once daily due to improved lab results. She also takes tirzepatide  for appetite control and psyllium husk capsules to prevent constipation. No recent illnesses, chest pain, or bowel changes.  She experiences intermittent pressure in her left ear, which is alleviated by using azelastine  nasal spray. If she misses a dose, the pressure extends into her neck causing soreness. She denies needing ear tubes despite her sister's suggestion. During a recent episode of tonsillitis, initially suspected to be strep throat, she experienced pressure in her ear but reports no current fever, chills, or significant headaches.  She has a history of fibroids and was considering a fibroid embolization but decided against it. She reports occasional pain and heavy menstrual days but notes that her symptoms have been easing. She has not had a menstrual period since November.  She uses a CPAP machine for sleep apnea, which she adjusted herself due to discomfort. She reports good compliance with the machine. There is a family history of sleep apnea, as her mother was recently diagnosed with severe sleep apnea.    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   Left great toe   GERD (gastroesophageal reflux disease)    Hypertension    IBS (irritable bowel syndrome)    Ingrown nail 06/2012   right foot next to the last toe   Insomnia 04/20/2013   Joint pain    Lactose intolerance    Leg edema    Obesity    Pedal edema 02/20/2015   Prediabetes    Preventative health care 09/25/2015   PVC (premature ventricular contraction)    Recurrent upper respiratory infection (URI)    Urticaria     Past Surgical History:  Procedure Laterality Date   HYSTEROSCOPY N/A 10/04/2015   Procedure: HYSTEROSCOPY with Removal IUD;  Surgeon: Reggy Capers, MD;  Location: [facility];  Service: Gynecology;  Laterality: N/A;   IR RADIOLOGIST EVAL & MGMT  10/03/2023   lasik     eye surgery   WISDOM TOOTH EXTRACTION      Family History  Problem Relation Age of Onset   Allergic rhinitis Mother    Heart attack Mother 22   Hypertension Mother    Heart disease Mother    Obesity Mother    Cancer Father        prostate   Prostate cancer Father    Allergic rhinitis Sister    Heart attack Sister 73   Allergic rhinitis Sister    Allergic rhinitis Sister    Allergic rhinitis Maternal Grandmother    Hypertension Other  Colon cancer Neg Hx    Colon polyps Neg Hx    Esophageal cancer Neg Hx    Stomach cancer Neg Hx    Rectal cancer Neg Hx     Social History   Socioeconomic History   Marital status: Single    Spouse name: Not on file   Number of children: Not on file   Years of education: Not on file   Highest education level: Not on file  Occupational History   Occupation: Runner, broadcasting/film/video, Public librarian  Tobacco Use   Smoking status: Former    Current packs/day: 0.00    Average packs/day: 1 pack/day for 15.0 years (15.0 ttl pk-yrs)    Types: Cigarettes    Start date: 09/03/1986    Quit date: 09/03/2001    Years since quitting: 22.4    Passive exposure: Never   Smokeless tobacco: Never  Vaping Use   Vaping status: Never Used  Substance and  Sexual Activity   Alcohol use: Yes    Comment: rare   Drug use: No   Sexual activity: Yes    Birth control/protection: None  Other Topics Concern   Not on file  Social History Narrative   Not on file   Social Drivers of Health   Financial Resource Strain: Not on file  Food Insecurity: Not on file  Transportation Needs: Not on file  Physical Activity: Not on file  Stress: Not on file  Social Connections: Not on file  Intimate Partner Violence: Not on file    Outpatient Medications Prior to Visit  Medication Sig Dispense Refill   acetaminophen  (TYLENOL  8 HOUR) 650 MG CR tablet Take 1 tablet (650 mg total) every 8 (eight) hours as needed by mouth for pain.     amLODipine  (NORVASC ) 10 MG tablet Take 1 tablet (10 mg total) by mouth daily. 90 tablet 2   atorvastatin  (LIPITOR) 10 MG tablet Take 1 tablet (10 mg total) by mouth daily. 90 tablet 3   azelastine  (ASTELIN ) 0.1 % nasal spray Place 1 spray into both nostrils 2 (two) times daily. Use in each nostril as directed 30 mL 12   azithromycin  (ZITHROMAX ) 250 MG tablet Take first 2 tablets together, then 1 every day until finished. 6 tablet 0   benzonatate  (TESSALON  PERLES) 100 MG capsule Take 1-2 capsules (100-200 mg total) by mouth 3 (three) times daily as needed. 40 capsule 0   buPROPion  (WELLBUTRIN  SR) 200 MG 12 hr tablet Take 1 tablet (200 mg total) by mouth in the morning. 90 tablet 0   Carbinoxamine  Maleate 4 MG TABS Take 1 tablet (4 mg total) by mouth 4 (four) times daily as needed. 28 tablet 0   carvedilol  (COREG ) 25 MG tablet Take 1 tablet (25 mg total) by mouth 2 (two) times daily. 180 tablet 3   cholecalciferol (VITAMIN D3) 25 MCG (1000 UT) tablet Take 4,000 Units by mouth daily.     EPINEPHrine  (EPIPEN  2-PAK) 0.3 mg/0.3 mL IJ SOAJ injection Use as directed for severe allergic reaction 2 each 1   Fluticasone  Propionate (XHANCE ) 93 MCG/ACT EXHU Place 2 sprays into both nostrils 2 (two) times daily for nasal congestion. 16 mL 5    hydrochlorothiazide  (MICROZIDE ) 12.5 MG capsule Take 1 capsule (12.5 mg total) by mouth daily. 90 capsule 0   ibuprofen  (ADVIL ,MOTRIN ) 600 MG tablet Take 1 tablet (600 mg total) by mouth every 6 (six) hours as needed. 90 tablet 0   losartan  (COZAAR ) 100 MG tablet Take 1 tablet (100 mg  total) by mouth daily. 90 tablet 3   medroxyPROGESTERone  (PROVERA ) 10 MG tablet Take 1 tablet (10 mg total) by mouth daily. 90 tablet 3   metFORMIN  (GLUCOPHAGE ) 500 MG tablet Take 1 tablet (500 mg total) by mouth 2 (two) times daily with a meal. 180 tablet 0   ondansetron  (ZOFRAN -ODT) 4 MG disintegrating tablet Dissolve 1 tablet (4 mg total) by mouth every 8 (eight) hours as needed for nausea or vomiting. 20 tablet 0   pantoprazole  (PROTONIX ) 40 MG tablet Take 1 tablet (40 mg total) by mouth daily. 90 tablet 1   potassium chloride  SA (KLOR-CON  M) 20 MEQ tablet Take 1 tablet (20 mEq total) by mouth 3 (three) times daily. 270 tablet 0   tirzepatide  ([name] ) 15 MG/0.5ML Pen Inject 15 mg into the skin once a week. 6 mL 0   No facility-administered medications prior to visit.    Allergies  Allergen Reactions   Penicillins Itching and Swelling    Has patient had a PCN reaction causing immediate rash, facial/tongue/throat swelling, SOB or lightheadedness with hypotension: yes  Has patient had a PCN reaction causing severe rash involving mucus membranes or skin necrosis: no  Has patient had a PCN reaction that required hospitalization no  Has patient had a PCN reaction occurring within the last 10 years: yes  If all of the above answers are NO, then may proceed with Cephalosporin use.   Victoza [Liraglutide]     Vomiting    Review of Systems  Constitutional:  Negative for chills, fever and malaise/fatigue.  HENT:  Positive for ear pain. Negative for congestion, ear discharge and hearing loss.   Eyes:  Negative for discharge.  Respiratory:  Negative for cough, sputum production and shortness of breath.    Cardiovascular:  Negative for chest pain, palpitations and leg swelling.  Gastrointestinal:  Negative for abdominal pain, blood in stool, constipation, diarrhea, heartburn, nausea and vomiting.  Genitourinary:  Negative for dysuria, frequency, hematuria and urgency.  Musculoskeletal:  Negative for back pain, falls and myalgias.  Skin:  Negative for rash.  Neurological:  Negative for dizziness, sensory change, loss of consciousness, weakness and headaches.  Endo/Heme/Allergies:  Negative for environmental allergies. Does not bruise/bleed easily.  Psychiatric/Behavioral:  Negative for depression and suicidal ideas. The patient is not nervous/anxious and does not have insomnia.        Objective:    Physical Exam Constitutional:      General: She is not in acute distress.    Appearance: Normal appearance. She is not diaphoretic.  HENT:     Head: Normocephalic and atraumatic.     Right Ear: Tympanic membrane, ear canal and external ear normal.     Left Ear: Tympanic membrane, ear canal and external ear normal.     Nose: Nose normal.     Mouth/Throat:     Mouth: Mucous membranes are moist.     Pharynx: Oropharynx is clear. No oropharyngeal exudate.   Eyes:     General: No scleral icterus.       Right eye: No discharge.        Left eye: No discharge.     Conjunctiva/sclera: Conjunctivae normal.     Pupils: Pupils are equal, round, and reactive to light.   Neck:     Thyroid : No thyromegaly.   Cardiovascular:     Rate and Rhythm: Normal rate and regular rhythm.     Heart sounds: Normal heart sounds. No murmur heard. Pulmonary:     Effort: Pulmonary  effort is normal. No respiratory distress.     Breath sounds: Normal breath sounds. No wheezing or rales.  Abdominal:     General: Bowel sounds are normal. There is no distension.     Palpations: Abdomen is soft. There is no mass.     Tenderness: There is no abdominal tenderness.   Musculoskeletal:        General: No tenderness.  Normal range of motion.     Cervical back: Normal range of motion and neck supple.  Lymphadenopathy:     Cervical: No cervical adenopathy.   Skin:    General: Skin is warm and dry.     Findings: No rash.   Neurological:     General: No focal deficit present.     Mental Status: She is alert and oriented to person, place, and time.     Cranial Nerves: No cranial nerve deficit.     Coordination: Coordination normal.     Deep Tendon Reflexes: Reflexes are normal and symmetric. Reflexes normal.   Psychiatric:        Mood and Affect: Mood normal.        Behavior: Behavior normal.        Thought Content: Thought content normal.        Judgment: Judgment normal.   LMP 01/23/2024  Wt Readings from Last 3 Encounters:  02/10/24 272 lb (123.4 kg)  02/08/24 266 lb 15.6 oz (121.1 kg)  01/13/24 267 lb (121.1 kg)    Diabetic Foot Exam - Simple   No data filed    Lab Results  Component Value Date   WBC 4.7 12/16/2023   HGB 13.2 12/16/2023   HCT 41.1 12/16/2023   PLT 257 12/16/2023   GLUCOSE 78 12/16/2023   CHOL 112 12/16/2023   TRIG 88 12/16/2023   HDL 37 (L) 12/16/2023   LDLCALC 58 12/16/2023   ALT 11 12/16/2023   AST 12 12/16/2023   NA 140 12/16/2023   K 3.8 12/16/2023   CL 102 12/16/2023   CREATININE 0.67 12/16/2023   BUN 6 12/16/2023   CO2 19 (L) 12/16/2023   TSH 1.44 08/05/2023   HGBA1C 5.3 12/16/2023   MICROALBUR 11.7 (H) 09/09/2023    Lab Results  Component Value Date   TSH 1.44 08/05/2023   Lab Results  Component Value Date   WBC 4.7 12/16/2023   HGB 13.2 12/16/2023   HCT 41.1 12/16/2023   MCV 89 12/16/2023   PLT 257 12/16/2023   Lab Results  Component Value Date   NA 140 12/16/2023   K 3.8 12/16/2023   CO2 19 (L) 12/16/2023   GLUCOSE 78 12/16/2023   BUN 6 12/16/2023   CREATININE 0.67 12/16/2023   BILITOT 0.3 12/16/2023   ALKPHOS 91 12/16/2023   AST 12 12/16/2023   ALT 11 12/16/2023   PROT 7.1 12/16/2023   ALBUMIN 4.2 12/16/2023   CALCIUM  9.5  12/16/2023   EGFR 103 12/16/2023   GFR 75.06 09/09/2023   Lab Results  Component Value Date   CHOL 112 12/16/2023   Lab Results  Component Value Date   HDL 37 (L) 12/16/2023   Lab Results  Component Value Date   LDLCALC 58 12/16/2023   Lab Results  Component Value Date   TRIG 88 12/16/2023   Lab Results  Component Value Date   CHOLHDL 3 08/05/2023   Lab Results  Component Value Date   HGBA1C 5.3 12/16/2023       Assessment & Plan:  BMI  40.0-44.9, adult (HCC) Current BMI 40.1 Assessment & Plan: Encouraged DASH or MIND diet, decrease po intake and increase exercise as tolerated. Needs 7-8 hours of sleep nightly. Avoid trans fats, eat small, frequent meals every 4-5 hours with lean proteins, complex carbs and healthy fats. Minimize simple carbs, high fat foods and processed foods    Type 2 diabetes mellitus with hyperglycemia, unspecified whether long term insulin  use (HCC) Assessment & Plan: hgba1c acceptable, minimize simple carbs. Increase exercise as tolerated. Continue current meds    Hyperlipidemia associated with type 2 diabetes mellitus (HCC) Assessment & Plan: Encourage heart healthy diet such as MIND or DASH diet, increase exercise, avoid trans fats, simple carbohydrates and processed foods, consider a krill or fish or flaxseed oil cap daily. Tolerating Atorvastatin    Vitamin D  deficiency Assessment & Plan: Supplement and monitor    Preventative health care Assessment & Plan: Patient encouraged to maintain heart healthy diet, regular exercise, adequate sleep. Consider daily probiotics. Take medications as prescribed. Labs ordered and reviewed. Follows with OB for paps and MGMs.  Colonoscopy was 2021 repeat in 10 years. Given and reviewed copy of ACP documents from [facility] Secretary of State and encouraged to complete and return      Assessment and Plan Assessment & Plan Type 2 Diabetes Mellitus Improved insulin  levels. Metformin  reduced to once daily.  Tirzepatide  aids appetite control and weight management. Corrected urine microalbumin to creatinine ratio error. Monitoring kidney function is essential. - Update medication list to metformin  500 mg once daily. - Continue tirzepatide  12.5 mg/0.5 mL subcutaneous once a week. - Order urine microalbumin to creatinine ratio. - Order thyroid  function tests, cholesterol panel, and vitamin D  level.  Obstructive Sleep Apnea Managed with CPAP. Self-adjusted settings improved comfort and effectiveness. Weight loss may improve symptoms. - Continue CPAP use. - Follow up with sleep specialist as needed.  Uterine Fibroids Occasional pain and heavy bleeding. Amenorrhea suggests menopause progression. Previous biopsy ruled out cancer. Declined embolization, managing symptoms conservatively. Fibroids expected to shrink with menopause. - Monitor symptoms and report significant changes in bleeding or pain.  Eustachian Tube Dysfunction Intermittent left ear pressure due to eustachian tube dysfunction. Symptoms improve with Astelin  nasal spray. Related to nasal congestion affecting drainage. - Use Astelin  nasal spray daily. - Perform nasal saline irrigation as needed. - Perform Valsalva maneuver after nasal spray.  Constipation Managed with psyllium husk capsules. Effective for bowel regularity, cholesterol, and blood sugar management. - Continue psyllium husk capsules as needed.  General Health Maintenance Discussed importance of exercise, diet, and hydration. Encouraged lifestyle habits for long-term health. Recommended annual COVID-19 and flu vaccinations. - Encourage regular exercise, healthy diet, and adequate hydration. - Consider annual COVID-19 and flu vaccinations in the fall. - Discuss RSV vaccination in the future.  Goals of Care Has not completed advanced directives or healthcare power of attorney. Discussed importance and provided information on submission. - Provide advanced directives and  healthcare power of attorney forms for completion.  Follow-up Routine follow-up to monitor chronic conditions and adjust treatment. Six-month follow-up appropriate. - Schedule follow-up appointment in 6 months. - Follow up with lab results as needed.     Randie Bustle, MD

## 2024-02-20 NOTE — Patient Instructions (Signed)
 Preventive Care 16-55 Years Old, Female  Preventive care refers to lifestyle choices and visits with your health care provider that can promote health and wellness. Preventive care visits are also called wellness exams.  What can I expect for my preventive care visit?  Counseling  Your health care provider may ask you questions about your:  Medical history, including:  Past medical problems.  Family medical history.  Pregnancy history.  Current health, including:  Menstrual cycle.  Method of birth control.  Emotional well-being.  Home life and relationship well-being.  Sexual activity and sexual health.  Lifestyle, including:  Alcohol, nicotine or tobacco, and drug use.  Access to firearms.  Diet, exercise, and sleep habits.  Work and work Astronomer.  Sunscreen use.  Safety issues such as seatbelt and bike helmet use.  Physical exam  Your health care provider will check your:  Height and weight. These may be used to calculate your BMI (body mass index). BMI is a measurement that tells if you are at a healthy weight.  Waist circumference. This measures the distance around your waistline. This measurement also tells if you are at a healthy weight and may help predict your risk of certain diseases, such as type 2 diabetes and high blood pressure.  Heart rate and blood pressure.  Body temperature.  Skin for abnormal spots.  What immunizations do I need?    Vaccines are usually given at various ages, according to a schedule. Your health care provider will recommend vaccines for you based on your age, medical history, and lifestyle or other factors, such as travel or where you work.  What tests do I need?  Screening  Your health care provider may recommend screening tests for certain conditions. This may include:  Lipid and cholesterol levels.  Diabetes screening. This is done by checking your blood sugar (glucose) after you have not eaten for a while (fasting).  Pelvic exam and Pap test.  Hepatitis B test.  Hepatitis C  test.  HIV (human immunodeficiency virus) test.  STI (sexually transmitted infection) testing, if you are at risk.  Lung cancer screening.  Colorectal cancer screening.  Mammogram. Talk with your health care provider about when you should start having regular mammograms. This may depend on whether you have a family history of breast cancer.  BRCA-related cancer screening. This may be done if you have a family history of breast, ovarian, tubal, or peritoneal cancers.  Bone density scan. This is done to screen for osteoporosis.  Talk with your health care provider about your test results, treatment options, and if necessary, the need for more tests.  Follow these instructions at home:  Eating and drinking    Eat a diet that includes fresh fruits and vegetables, whole grains, lean protein, and low-fat dairy products.  Take vitamin and mineral supplements as recommended by your health care provider.  Do not drink alcohol if:  Your health care provider tells you not to drink.  You are pregnant, may be pregnant, or are planning to become pregnant.  If you drink alcohol:  Limit how much you have to 0-1 drink a day.  Know how much alcohol is in your drink. In the [place] one drink equals one 12 oz bottle of beer (355 mL), one 5 oz glass of wine (148 mL), or one 1 oz glass of hard liquor (44 mL).  Lifestyle  Brush your teeth every morning and night with fluoride toothpaste. Floss one time each day.  Exercise for at least  30 minutes 5 or more days each week.  Do not use any products that contain nicotine or tobacco. These products include cigarettes, chewing tobacco, and vaping devices, such as e-cigarettes. If you need help quitting, ask your health care provider.  Do not use drugs.  If you are sexually active, practice safe sex. Use a condom or other form of protection to prevent STIs.  If you do not wish to become pregnant, use a form of birth control. If you plan to become pregnant, see your health care provider for a  prepregnancy visit.  Take aspirin only as told by your health care provider. Make sure that you understand how much to take and what form to take. Work with your health care provider to find out whether it is safe and beneficial for you to take aspirin daily.  Find healthy ways to manage stress, such as:  Meditation, yoga, or listening to music.  Journaling.  Talking to a trusted person.  Spending time with friends and family.  Minimize exposure to UV radiation to reduce your risk of skin cancer.  Safety  Always wear your seat belt while driving or riding in a vehicle.  Do not drive:  If you have been drinking alcohol. Do not ride with someone who has been drinking.  When you are tired or distracted.  While texting.  If you have been using any mind-altering substances or drugs.  Wear a helmet and other protective equipment during sports activities.  If you have firearms in your house, make sure you follow all gun safety procedures.  Seek help if you have been physically or sexually abused.  What's next?  Visit your health care provider once a year for an annual wellness visit.  Ask your health care provider how often you should have your eyes and teeth checked.  Stay up to date on all vaccines.  This information is not intended to replace advice given to you by your health care provider. Make sure you discuss any questions you have with your health care provider.  Document Revised: 02/15/2021 Document Reviewed: 02/15/2021  Elsevier Patient Education  2024 [employer].

## 2024-02-20 NOTE — Assessment & Plan Note (Signed)
 Using cpap routinely each night

## 2024-02-21 ENCOUNTER — Ambulatory Visit: Payer: Self-pay | Admitting: Family Medicine

## 2024-02-21 LAB — LIPID PANEL
Cholesterol: 109 mg/dL (ref 0–200)
HDL: 36.2 mg/dL — ABNORMAL LOW (ref >39.00)
LDL Cholesterol: 57 mg/dL (ref 0–99)
NonHDL: 72.72
Total CHOL/HDL Ratio: 3
Triglycerides: 80 mg/dL (ref 0.0–149.0)
VLDL: 16 mg/dL (ref 0.0–40.0)

## 2024-02-21 LAB — CBC WITH DIFFERENTIAL/PLATELET
Basophils Absolute: 0.1 10*3/uL (ref 0.0–0.1)
Basophils Relative: 1 % (ref 0.0–3.0)
Eosinophils Absolute: 0.1 10*3/uL (ref 0.0–0.7)
Eosinophils Relative: 2.1 % (ref 0.0–5.0)
HCT: 37.3 % (ref 36.0–46.0)
Hemoglobin: 12.6 g/dL (ref 12.0–15.0)
Lymphocytes Relative: 25.4 % (ref 12.0–46.0)
Lymphs Abs: 1.5 10*3/uL (ref 0.7–4.0)
MCHC: 33.7 g/dL (ref 30.0–36.0)
MCV: 85.3 fl (ref 78.0–100.0)
Monocytes Absolute: 0.5 10*3/uL (ref 0.1–1.0)
Monocytes Relative: 8 % (ref 3.0–12.0)
Neutro Abs: 3.7 10*3/uL (ref 1.4–7.7)
Neutrophils Relative %: 63.5 % (ref 43.0–77.0)
Platelets: 251 10*3/uL (ref 150.0–400.0)
RBC: 4.37 Mil/uL (ref 3.87–5.11)
RDW: 14 % (ref 11.5–15.5)
WBC: 5.8 10*3/uL (ref 4.0–10.5)

## 2024-02-21 LAB — COMPREHENSIVE METABOLIC PANEL WITH GFR
ALT: 17 U/L (ref 0–35)
AST: 12 U/L (ref 0–37)
Albumin: 4.2 g/dL (ref 3.5–5.2)
Alkaline Phosphatase: 65 U/L (ref 39–117)
BUN: 11 mg/dL (ref 6–23)
CO2: 27 meq/L (ref 19–32)
Calcium: 9.2 mg/dL (ref 8.4–10.5)
Chloride: 105 meq/L (ref 96–112)
Creatinine, Ser: 0.85 mg/dL (ref 0.40–1.20)
GFR: 76.94 mL/min (ref >60.00)
Glucose, Bld: 89 mg/dL (ref 70–99)
Potassium: 3.8 meq/L (ref 3.5–5.1)
Sodium: 141 meq/L (ref 135–145)
Total Bilirubin: 0.4 mg/dL (ref 0.2–1.2)
Total Protein: 7.1 g/dL (ref 6.0–8.3)

## 2024-02-21 LAB — MICROALBUMIN / CREATININE URINE RATIO
Creatinine,U: 486 mg/dL
Microalb Creat Ratio: 8.1 mg/g (ref 0.0–30.0)
Microalb, Ur: 4 mg/dL — ABNORMAL HIGH (ref 0.0–1.9)

## 2024-02-21 LAB — TSH: TSH: 1 u[IU]/mL (ref 0.35–5.50)

## 2024-02-21 LAB — VITAMIN D 25 HYDROXY (VIT D DEFICIENCY, FRACTURES): VITD: 74.67 ng/mL (ref 30.00–100.00)

## 2024-02-27 ENCOUNTER — Encounter: Payer: BC Managed Care – PPO | Admitting: Family Medicine

## 2024-03-02 ENCOUNTER — Ambulatory Visit: Payer: BC Managed Care – PPO | Admitting: Adult Health

## 2024-03-04 ENCOUNTER — Encounter: Payer: Self-pay | Admitting: Adult Health

## 2024-03-04 ENCOUNTER — Ambulatory Visit: Admitting: Adult Health

## 2024-03-04 VITALS — BP 110/70 | HR 98 | Ht 70.0 in | Wt 278.8 lb

## 2024-03-04 DIAGNOSIS — G4733 Obstructive sleep apnea (adult) (pediatric): Secondary | ICD-10-CM | POA: Diagnosis not present

## 2024-03-04 DIAGNOSIS — Z6841 Body Mass Index (BMI) 40.0 and over, adult: Secondary | ICD-10-CM

## 2024-03-04 DIAGNOSIS — Z87891 Personal history of nicotine dependence: Secondary | ICD-10-CM | POA: Diagnosis not present

## 2024-03-04 NOTE — Patient Instructions (Signed)
 CPAP at bedtime, wear all night long for at least 6hrs.  Healthy sleep regimen Do not drive if sleepy Work on healthy weight Follow-up in 1 year and As needed

## 2024-03-04 NOTE — Assessment & Plan Note (Signed)
 Continue to work on healthy weight loss

## 2024-03-04 NOTE — Assessment & Plan Note (Signed)
 Excellent control compliance on nocturnal CPAP.  Patient education given on CPAP care  Plan Patient Instructions  CPAP at bedtime, wear all night long for at least 6hrs.  Healthy sleep regimen Do not drive if sleepy Work on healthy weight Follow-up in 1 year and As needed

## 2024-03-04 NOTE — Progress Notes (Signed)
 @Patient  ID: Brandi Lester, female    DOB: [birth year], 58 y.o.   MRN: [id number]  Chief Complaint  Patient presents with   Follow-up    Referring provider: Domenica Harlene LABOR, MD  HPI: 58 year old female seen for sleep consult July 2024 for snoring and daytime sleepiness found to have moderate obstructive sleep apnea  TEST/EVENTS :  HST 04/06/23 >> AHI 27, SpO2 low 82%   03/04/2024 Follow up ; OSA  Patient presents for a follow-up for sleep apnea.  Patient says she is doing well on CPAP.  She feels that she benefits from CPAP with decreased daytime sleepiness.  CPAP download shows excellent compliance with daily average usage at 5.5 hours.  She is on auto CPAP 6 to 10 cm H2O.  AHI 0.4/hour.  Daily average pressure at 9.0 cm H2O.  Patient continues to work on weight loss.  She is down 60 pounds.  Is currently on [name]  injections.   Allergies  Allergen Reactions   Penicillins Itching and Swelling    Has patient had a PCN reaction causing immediate rash, facial/tongue/throat swelling, SOB or lightheadedness with hypotension: yes  Has patient had a PCN reaction causing severe rash involving mucus membranes or skin necrosis: no  Has patient had a PCN reaction that required hospitalization no  Has patient had a PCN reaction occurring within the last 10 years: yes  If all of the above answers are NO, then may proceed with Cephalosporin use.   Victoza [Liraglutide]     Vomiting    Immunization History  Administered Date(s) Administered   Influenza Whole 06/22/2009, 08/03/2010   Influenza, Seasonal, Injecte, Preservative Fre 05/27/2023   Influenza,inj,Quad PF,6+ Mos 07/20/2013, 05/11/2014, 06/29/2015, 06/26/2016, 07/23/2017, 06/04/2019, 05/31/2020, 06/06/2021, 06/18/2022   Influenza-Unspecified 06/17/2014, 07/23/2017   Moderna Covid-19 Vaccine Bivalent Booster 60yrs & up 06/06/2021   PFIZER Comirnaty (Gray Top)Covid-19 Tri-Sucrose Vaccine 03/24/2021   PFIZER(Purple  Top)SARS-COV-2 Vaccination 10/31/2019, 11/21/2019, 07/07/2020   Pfizer(Comirnaty )Fall Seasonal Vaccine 12 years and older 05/29/2023   Pneumococcal Conjugate-13 09/15/2015   Pneumococcal Polysaccharide-23 07/01/2018   Td 07/21/2009   Tdap 05/31/2020   Zoster Recombinant(Shingrix ) 12/15/2020, 03/24/2021    Past Medical History:  Diagnosis Date   ADD (attention deficit disorder)    Allergy     allergic rhinitis   Anemia 07/25/2013   Angio-edema    Anxiety    Arthritis    not dx'd   Back pain    Costochondritis 02/20/2015   Diabetes mellitus    Type II   previously 3 years ago - no longer on meds    Dyslipidemia 07/25/2013   Fungus infection 06/2012   Left great toe   GERD (gastroesophageal reflux disease)    Hypertension    IBS (irritable bowel syndrome)    Ingrown nail 06/2012   right foot next to the last toe   Insomnia 04/20/2013   Joint pain    Lactose intolerance    Leg edema    Obesity    Pedal edema 02/20/2015   Prediabetes    Preventative health care 09/25/2015   PVC (premature ventricular contraction)    Recurrent upper respiratory infection (URI)    Urticaria     Tobacco History: Social History   Tobacco Use  Smoking Status Former   Current packs/day: 0.00   Average packs/day: 1 pack/day for 15.0 years (15.0 ttl pk-yrs)   Types: Cigarettes   Start date: 09/03/1986   Quit date: 09/03/2001   Years since quitting: 22.5   Passive exposure: Never  Smokeless Tobacco Never   Counseling given: Not Answered   Outpatient Medications Prior to Visit  Medication Sig Dispense Refill   acetaminophen  (TYLENOL  8 HOUR) 650 MG CR tablet Take 1 tablet (650 mg total) every 8 (eight) hours as needed by mouth for pain.     amLODipine  (NORVASC ) 10 MG tablet Take 1 tablet (10 mg total) by mouth daily. 90 tablet 2   atorvastatin  (LIPITOR) 10 MG tablet Take 1 tablet (10 mg total) by mouth daily. 90 tablet 3   azelastine  (ASTELIN ) 0.1 % nasal spray Place 1 spray into both  nostrils 2 (two) times daily. Use in each nostril as directed 30 mL 12   azithromycin  (ZITHROMAX ) 250 MG tablet Take first 2 tablets together, then 1 every day until finished. 6 tablet 0   benzonatate  (TESSALON  PERLES) 100 MG capsule Take 1-2 capsules (100-200 mg total) by mouth 3 (three) times daily as needed. 40 capsule 0   buPROPion  (WELLBUTRIN  SR) 200 MG 12 hr tablet Take 1 tablet (200 mg total) by mouth in the morning. 90 tablet 0   Carbinoxamine  Maleate 4 MG TABS Take 1 tablet (4 mg total) by mouth 4 (four) times daily as needed. 28 tablet 0   carvedilol  (COREG ) 25 MG tablet Take 1 tablet (25 mg total) by mouth 2 (two) times daily. 180 tablet 3   cholecalciferol (VITAMIN D3) 25 MCG (1000 UT) tablet Take 4,000 Units by mouth daily.     EPINEPHrine  (EPIPEN  2-PAK) 0.3 mg/0.3 mL IJ SOAJ injection Use as directed for severe allergic reaction 2 each 1   Fluticasone  Propionate (XHANCE ) 93 MCG/ACT EXHU Place 2 sprays into both nostrils 2 (two) times daily for nasal congestion. 16 mL 5   hydrochlorothiazide  (MICROZIDE ) 12.5 MG capsule Take 1 capsule (12.5 mg total) by mouth daily. 90 capsule 0   ibuprofen  (ADVIL ,MOTRIN ) 600 MG tablet Take 1 tablet (600 mg total) by mouth every 6 (six) hours as needed. 90 tablet 0   losartan  (COZAAR ) 100 MG tablet Take 1 tablet (100 mg total) by mouth daily. 90 tablet 3   medroxyPROGESTERone  (PROVERA ) 10 MG tablet Take 1 tablet (10 mg total) by mouth daily. 90 tablet 3   metFORMIN  (GLUCOPHAGE ) 500 MG tablet Take 1 tablet (500 mg total) by mouth daily with breakfast.     ondansetron  (ZOFRAN -ODT) 4 MG disintegrating tablet Dissolve 1 tablet (4 mg total) by mouth every 8 (eight) hours as needed for nausea or vomiting. 20 tablet 0   pantoprazole  (PROTONIX ) 40 MG tablet Take 1 tablet (40 mg total) by mouth daily. 90 tablet 1   potassium chloride  SA (KLOR-CON  M) 20 MEQ tablet Take 1 tablet (20 mEq total) by mouth 3 (three) times daily. 270 tablet 0   Psyllium 400 MG CAPS Take  2 capsules by mouth daily.     tirzepatide  ([name] ) 15 MG/0.5ML Pen Inject 15 mg into the skin once a week. 6 mL 0   No facility-administered medications prior to visit.     Review of Systems:   Constitutional:   No  weight loss, night sweats,  Fevers, chills, fatigue, or  lassitude.  HEENT:   No headaches,  Difficulty swallowing,  Tooth/dental problems, or  Sore throat,                No sneezing, itching, ear ache, nasal congestion, post nasal drip,   CV:  No chest pain,  Orthopnea, PND, swelling in lower extremities, anasarca, dizziness, palpitations, syncope.   GI  No heartburn,  indigestion, abdominal pain, nausea, vomiting, diarrhea, change in bowel habits, loss of appetite, bloody stools.   Resp: No shortness of breath with exertion or at rest.  No excess mucus, no productive cough,  No non-productive cough,  No coughing up of blood.  No change in color of mucus.  No wheezing.  No chest wall deformity  Skin: no rash or lesions.  GU: no dysuria, change in color of urine, no urgency or frequency.  No flank pain, no hematuria   MS:  No joint pain or swelling.  No decreased range of motion.  No back pain.    Physical Exam  BP 110/70 (BP Location: Left Arm, Patient Position: Sitting, Cuff Size: Large)   Pulse 98   Ht 5' 10 (1.778 m)   Wt 278 lb 12.8 oz (126.5 kg)   LMP 01/23/2024   SpO2 100%   BMI 40.00 kg/m   GEN: A/Ox3; pleasant , NAD, well nourished    HEENT:  Level Green/AT,  NOSE-clear, THROAT-clear, no lesions, no postnasal drip or exudate noted.   NECK:  Supple w/ fair ROM; no JVD; normal carotid impulses w/o bruits; no thyromegaly or nodules palpated; no lymphadenopathy.    RESP  Clear  P & A; w/o, wheezes/ rales/ or rhonchi. no accessory muscle use, no dullness to percussion  CARD:  RRR, no m/r/g, no peripheral edema, pulses intact, no cyanosis or clubbing.  GI:   Soft & nt; nml bowel sounds; no organomegaly or masses detected.   Musco: Warm bil, no deformities  or joint swelling noted.   Neuro: alert, no focal deficits noted.    Skin: Warm, no lesions or rashes    Lab Results:  CBC    Component Value Date/Time   WBC 5.8 02/20/2024 1444   RBC 4.37 02/20/2024 1444   HGB 12.6 02/20/2024 1444   HGB 13.2 12/16/2023 0902   HCT 37.3 02/20/2024 1444   HCT 41.1 12/16/2023 0902   PLT 251.0 02/20/2024 1444   PLT 257 12/16/2023 0902   MCV 85.3 02/20/2024 1444   MCV 89 12/16/2023 0902   MCH 28.6 12/16/2023 0902   MCH 28.3 11/28/2020 1358   MCHC 33.7 02/20/2024 1444   RDW 14.0 02/20/2024 1444   RDW 14.4 12/16/2023 0902   LYMPHSABS 1.5 02/20/2024 1444   LYMPHSABS 1.3 12/16/2023 0902   MONOABS 0.5 02/20/2024 1444   EOSABS 0.1 02/20/2024 1444   EOSABS 0.1 12/16/2023 0902   BASOSABS 0.1 02/20/2024 1444   BASOSABS 0.1 12/16/2023 0902    BMET    Component Value Date/Time   NA 141 02/20/2024 1444   NA 140 12/16/2023 0902   K 3.8 02/20/2024 1444   CL 105 02/20/2024 1444   CO2 27 02/20/2024 1444   GLUCOSE 89 02/20/2024 1444   BUN 11 02/20/2024 1444   BUN 6 12/16/2023 0902   CREATININE 0.85 02/20/2024 1444   CREATININE 0.79 08/10/2022 1503   CALCIUM  9.2 02/20/2024 1444   GFRNONAA 103 10/20/2020 0933   GFRAA 119 10/20/2020 0933    BNP No results found for: BNP  ProBNP No results found for: PROBNP  Imaging: No results found.  Administration History     None           No data to display          No results found for: NITRICOXIDE      Assessment & Plan:   OSA (obstructive sleep apnea) Excellent control compliance on nocturnal CPAP.  Patient education given on CPAP  care  Plan Patient Instructions  CPAP at bedtime, wear all night long for at least 6hrs.  Healthy sleep regimen Do not drive if sleepy Work on healthy weight Follow-up in 1 year and As needed      BMI 40.0-44.9, adult (HCC) Current BMI 40.1 Continue to work on healthy weight loss    Madelin Stank, NP 03/04/2024

## 2024-03-09 ENCOUNTER — Other Ambulatory Visit (HOSPITAL_BASED_OUTPATIENT_CLINIC_OR_DEPARTMENT_OTHER): Payer: Self-pay

## 2024-03-09 ENCOUNTER — Ambulatory Visit (INDEPENDENT_AMBULATORY_CARE_PROVIDER_SITE_OTHER): Admitting: Physician Assistant

## 2024-03-09 ENCOUNTER — Encounter (INDEPENDENT_AMBULATORY_CARE_PROVIDER_SITE_OTHER): Payer: Self-pay | Admitting: Physician Assistant

## 2024-03-09 VITALS — BP 115/76 | HR 89 | Temp 89.0°F | Ht 68.0 in | Wt 275.0 lb

## 2024-03-09 DIAGNOSIS — E1165 Type 2 diabetes mellitus with hyperglycemia: Secondary | ICD-10-CM | POA: Diagnosis not present

## 2024-03-09 DIAGNOSIS — E559 Vitamin D deficiency, unspecified: Secondary | ICD-10-CM

## 2024-03-09 DIAGNOSIS — Z6841 Body Mass Index (BMI) 40.0 and over, adult: Secondary | ICD-10-CM

## 2024-03-09 DIAGNOSIS — E785 Hyperlipidemia, unspecified: Secondary | ICD-10-CM | POA: Diagnosis not present

## 2024-03-09 DIAGNOSIS — I152 Hypertension secondary to endocrine disorders: Secondary | ICD-10-CM

## 2024-03-09 DIAGNOSIS — E1159 Type 2 diabetes mellitus with other circulatory complications: Secondary | ICD-10-CM | POA: Diagnosis not present

## 2024-03-09 DIAGNOSIS — Z7984 Long term (current) use of oral hypoglycemic drugs: Secondary | ICD-10-CM

## 2024-03-09 DIAGNOSIS — E669 Obesity, unspecified: Secondary | ICD-10-CM

## 2024-03-09 DIAGNOSIS — E1169 Type 2 diabetes mellitus with other specified complication: Secondary | ICD-10-CM

## 2024-03-09 DIAGNOSIS — Z7985 Long-term (current) use of injectable non-insulin antidiabetic drugs: Secondary | ICD-10-CM

## 2024-03-09 NOTE — Progress Notes (Signed)
 SUBJECTIVE: Discussed the use of AI scribe software for clinical note transcription with the patient, who gave verbal consent to proceed.  Chief Complaint: Obesity  Interim History: She is up 3 lbs since her last visit. Muscle mass + 12.2 lbs Adipose mass - 10.2 lbs  Down 50 lbs overall TBW loss of 15.4%  Brandi Lester is here to discuss her progress with her obesity treatment plan. She is on the Category 4 Plan and states she is following her eating plan approximately 75 % of the time. She states she is exercising with a stationary bike, and walking 30-45 minutes 3-4 times per week. Brandi Lester is a 58 year old female with obesity who presents for follow-up of her obesity treatment plan.  She is facing challenges in maintaining her exercise routine due to the summer heat, leading to a deprioritization of exercise. She has attempted to incorporate physical activity by cleaning the garage and using arm weights while working.  She is also exploring local fitness options, and we discussed [employer]- free classes at [place] and recreation as well as the [employer] Right start program.  Her dietary habits include dining out with friends, where she sometimes makes less healthy choices, such as ordering chicken alfredo. She practices portion control by spreading one meal over several sittings. She acknowledges that these choices may not be optimal but remains mindful of her overall intake.  She is currently on bupropion , [name] , and Lipitor and medications for hypertension-Norvasc  10 mg daily, losartan  100 mg daily, hydrochlorothiazide  12.5 mg daily and carvedilol  25 mg BID, KCL 20 mEq TID.  Brandi Lester She has three pens of [name]  left and is managing her medication refills, noting that the pharmacy sometimes puts her prescriptions on hold. She takes a vitamin D  supplement daily, though she is unsure of the exact dosage. No side effects from her medications are reported.  She has not had an eye  exam in the past year as her previous eye doctor retired, but she has found a new one.  OBJECTIVE: Visit Diagnoses: Problem List Items Addressed This Visit     Hyperlipidemia associated with type 2 diabetes mellitus (HCC)   Vitamin D  deficiency   Hypertension associated with type 2 diabetes mellitus (HCC)   Diabetes mellitus (HCC) - Primary   Relevant Orders   Hemoglobin A1c   Insulin , random   BMI 40.0-44.9, adult (HCC) Current BMI 40.1   Obesity (HCC)- Start BMI 47.99  Obesity Brandi Lester is being followed for obesity management. She has increased muscle mass by ~12 pounds and decreased adipose mass by nearly ~10 pounds, with a visceral adipose rating reduced to 15. Maintaining a healthy weight is crucial to reducing cardiovascular disease and stroke risk. She exercises despite the heat and is mindful of her diet, occasionally indulging in less healthy options. Looking at lean muscle mass rather than BMI, targeting an adipose percentage of 35 to mitigate cardiovascular and stroke risks as well as other complications related to DM and obesity. - Encourage participation in free fitness classes through [employer] and [employer]. - Consider the Right Start program at [facility] for personalized training sessions. - Encourage portion control strategies when dining out. Continue Cat 4 plan- and increased exercise to promote healthy weight loss.   Type 2 Diabetes Mellitus On [name]  15 mg daily Metformin  500 mg daily. (Decreased due to low CO2 on previous labs-? Mild acidosis)  Her glucose level is 89, and her A1c has been consistently less than 6  since 2021, indicating well-controlled diabetes. Reducing the risk of complications related to type 2 diabetes is essential. The potential need to restart a second dose metformin  if insulin  levels rise was discussed, emphasizing the importance of monitoring simple carbohydrate intake. Lab Results  Component Value Date   HGBA1C  5.3 12/16/2023   HGBA1C 5.6 08/05/2023   HGBA1C 5.4 02/19/2023   Lab Results  Component Value Date   MICROALBUR 4.0 (H) 02/20/2024   LDLCALC 57 02/20/2024   CREATININE 0.85 02/20/2024   INSULIN   Date Value Ref Range Status  12/16/2023 8.6 2.6 - 24.9 uIU/mL Final  ]She is working  on nutrition plan to decrease simple carbohydrates, increase lean proteins and exercise to promote weight loss and improve glycemic control .  - Order A1c and insulin  level tests. - Monitor simple carbohydrate intake.  Hypertension Blood pressure management is part of her chronic conditions being monitored. Norvasc  10 mg daily,  losartan  100 mg daily,  hydrochlorothiazide  12.5 mg daily  carvedilol  25 mg BID,  KCL 20 mEq TID.   Reports no SE from medications.  BP Readings from Last 3 Encounters:  03/09/24 115/76  03/04/24 110/70  02/20/24 128/78   Lab Results  Component Value Date   NA 141 02/20/2024   CL 105 02/20/2024   K 3.8 02/20/2024   CO2 27 02/20/2024   BUN 11 02/20/2024   CREATININE 0.85 02/20/2024   GFR 76.94 02/20/2024   CALCIUM  9.2 02/20/2024   PHOS 2.5 07/16/2014   ALBUMIN 4.2 02/20/2024   GLUCOSE 89 02/20/2024   Continue to work on nutrition plan to promote weight loss and improve BP control.  Continue usual medications noted above.   Hyperlipidemia Her cholesterol is 109, LDL is 57, and triglycerides are 80, indicating well-controlled hyperlipidemia. Exercise can help improve HDL levels, although genetic factors also play a role. Lab Results  Component Value Date   CHOL 109 02/20/2024   CHOL 112 12/16/2023   CHOL 132 08/05/2023   Lab Results  Component Value Date   HDL 36.20 (L) 02/20/2024   HDL 37 (L) 12/16/2023   HDL 40.90 08/05/2023   Lab Results  Component Value Date   LDLCALC 57 02/20/2024   LDLCALC 58 12/16/2023   LDLCALC 74 08/05/2023   Lab Results  Component Value Date   TRIG 80.0 02/20/2024   TRIG 88 12/16/2023   TRIG 88.0 08/05/2023   Lab  Results  Component Value Date   CHOLHDL 3 02/20/2024   CHOLHDL 3 08/05/2023   CHOLHDL 3 02/19/2023   No results found for: LDLDIRECT  - Continue current lipid management plan- lipitor 10 mg daily. And [name] .  Continue to work on nutrition plan -decreasing simple carbohydrates, increasing lean proteins, decreasing saturated fats and cholesterol , avoiding trans fats and exercise as able to promote weight loss, improve lipids and decrease cardiovascular risks.   Vitamin D  Deficiency Her vitamin D  level is 74. She takes a daily supplement, but the exact dosage is unknown. - Check the dosage of vitamin D  supplement and bring it to the next appointment. Last vitamin D  Lab Results  Component Value Date   VD25OH 74.67 02/20/2024    Low vitamin D  levels can be associated with adiposity and may result in leptin resistance and weight gain. Also associated with fatigue.  Currently on vitamin D  supplementation without any adverse effects such as nausea, vomiting or muscle weakness.    General Health Maintenance She has not had an eye exam in the past year due  to her eye doctor retiring. Regular eye exams are important, especially for patients with diabetes. - Schedule an eye exam with a new eye doctor.  Follow-up She is scheduled for follow-up appointments in August and September. - Follow up on August 5th at 11 AM. - Schedule next appointment for September 2nd at 4 PM. - Review lab results at the next appointment unless there are significant abnormalities.  Vitals Temp: (!) 89 F (31.7 C) BP: 115/76 Pulse Rate: 89 SpO2: 99 %   Anthropometric Measurements Height: 5' 8 (1.727 m) Weight: 275 lb (124.7 kg) BMI (Calculated): 41.82 Weight at Last Visit: 272 lb Weight Lost Since Last Visit: 0 Weight Gained Since Last Visit: 3 lb Starting Weight: 325 lb Total Weight Loss (lbs): 50 lb (22.7 kg) Peak Weight: 330 lb   Body Composition  Body Fat %: 46.1 % Fat Mass (lbs): 126.8  lbs Muscle Mass (lbs): 140.8 lbs Total Body Water (lbs): 95.4 lbs Visceral Fat Rating : 15   Other Clinical Data Fasting: yes Labs: yes Today's Visit #: 107 Starting Date: 05/23/17     ASSESSMENT AND PLAN:  Diet: Brandi Lester is currently in the action stage of change. As such, her goal is to continue with weight loss efforts. She has agreed to Category 4 Plan.  Exercise: Brandi Lester has been instructed to work up to a goal of 150 minutes of combined cardio and strengthening exercise per week and Look at [employer] and rec programs or [facility] Right start program for weight loss and overall health benefits.   Behavior Modification:  We discussed the following Behavioral Modification Strategies today: increasing lean protein intake, decreasing simple carbohydrates, increasing vegetables, increase H2O intake, increase high fiber foods, decreasing eating out, no skipping meals, meal planning and cooking strategies, avoiding temptations, and planning for success. We discussed various medication options to help Brandi Lester with her weight loss efforts and we both agreed to continue current treatment plan.  Return in about 4 weeks (around 04/06/2024).Brandi Lester She was informed of the importance of frequent follow up visits to maximize her success with intensive lifestyle modifications for her multiple health conditions.  Attestation Statements:   Reviewed by clinician on day of visit: allergies, medications, problem list, medical history, surgical history, family history, social history, and previous encounter notes.   Time spent on visit including pre-visit chart review and post-visit care and charting was 42 minutes.    Haset Oaxaca, PA-C

## 2024-03-11 ENCOUNTER — Ambulatory Visit (INDEPENDENT_AMBULATORY_CARE_PROVIDER_SITE_OTHER): Payer: Self-pay | Admitting: Physician Assistant

## 2024-03-11 LAB — HEMOGLOBIN A1C
Est. average glucose Bld gHb Est-mCnc: 114 mg/dL
Hgb A1c MFr Bld: 5.6 % (ref 4.8–5.6)

## 2024-03-11 LAB — INSULIN, RANDOM: INSULIN: 23.8 u[IU]/mL (ref 2.6–24.9)

## 2024-03-22 ENCOUNTER — Other Ambulatory Visit: Payer: Self-pay | Admitting: Family Medicine

## 2024-03-22 DIAGNOSIS — E876 Hypokalemia: Secondary | ICD-10-CM

## 2024-03-22 DIAGNOSIS — I10 Essential (primary) hypertension: Secondary | ICD-10-CM

## 2024-03-22 DIAGNOSIS — T7840XA Allergy, unspecified, initial encounter: Secondary | ICD-10-CM

## 2024-03-23 ENCOUNTER — Other Ambulatory Visit (HOSPITAL_BASED_OUTPATIENT_CLINIC_OR_DEPARTMENT_OTHER): Payer: Self-pay

## 2024-03-23 ENCOUNTER — Other Ambulatory Visit: Payer: Self-pay

## 2024-03-23 MED ORDER — POTASSIUM CHLORIDE CRYS ER 20 MEQ PO TBCR
20.0000 meq | EXTENDED_RELEASE_TABLET | Freq: Three times a day (TID) | ORAL | 0 refills | Status: DC
  Filled 2024-03-23: qty 270, 90d supply, fill #0

## 2024-03-26 NOTE — Progress Notes (Signed)
 This encounter was created in error - please disregard.

## 2024-04-06 NOTE — Progress Notes (Unsigned)
 SUBJECTIVE: Discussed the use of AI scribe software for clinical note transcription with the patient, who gave verbal consent to proceed.  Chief Complaint: Obesity  Interim History: She is up 1 lb since last visit Down 49 lbs overall TBW loss of 15.1%   Brandi Lester is here to discuss her progress with her obesity treatment plan. She is on the Category 4 Plan and states she is following her eating plan approximately 30 % of the time. She states she is exercising walking/bike 30-45 minutes 3-4 times per week.  Brandi Lester is a 58 year old female with obesity, type 2 diabetes,hyperlipidemia and hypertension who presents for follow-up on her obesity treatment plan.  She is actively managing her weight through dietary changes and physical activity. She recently attended a food truck event and consumed high-calorie beverages like pina colada lemonade, which she believes contributes to her calorie intake. She acknowledges that she may be consuming excess calories through drinks and is trying to be more mindful.  She uses a weighted vest during exercise and notes changes in her exercise routine over the summer.  Her type 2 diabetes is managed with [name]  15 mg weekly and metformin  500 mg once a day. Her A1c has been stable, she has elevated insulin  levels.   She notes that her recent blood pressure readings, including a reading of 103/68, have been lower than usual. Previously we tried to decrease her BP medications, but her BP went back up and medication was increased.   She has a history of depression and emotional eating, for which she is taking bupropion . She feels that her current dosage is effective, especially since she is less stressed during the summer. She has been skipping meals occasionally and tries to compensate with protein shakes, although she sometimes struggles to finish them.We discussed the importance of regular meals and adequate protein intake for weight loss and overall  health.  She experiences occasional constipation, which she knows can be worsened by Zofran  use. She occasionally takes Zofran  for nausea.   She has been taking psyllium husk to manage constipation and ensures she stays hydrated, drinking water with liquid IV regularly.  OBJECTIVE: Visit Diagnoses: Problem List Items Addressed This Visit     Vitamin D  deficiency   Depression   Hypertension associated with type 2 diabetes mellitus (HCC)   Relevant Medications   metFORMIN  (GLUCOPHAGE ) 500 MG tablet   Diabetes mellitus (HCC) - Primary   Relevant Medications   metFORMIN  (GLUCOPHAGE ) 500 MG tablet   BMI 40.0-44.9, adult (HCC) Current BMI 40.1   Relevant Medications   metFORMIN  (GLUCOPHAGE ) 500 MG tablet   Obesity (HCC)- Start BMI 47.99   Relevant Medications   metFORMIN  (GLUCOPHAGE ) 500 MG tablet   Other Visit Diagnoses       Nausea       Relevant Medications   ondansetron  (ZOFRAN -ODT) 4 MG disintegrating tablet     Constipation, unspecified constipation type         Obesity Obesity management is ongoing with the use of a weighted vest for exercise, maintaining muscle mass and reducing body adipose percentage. However, a slight increase in adipose percentage indicates a need for more mindful eating habits. She acknowledges consuming high-calorie drinks and occasional sweets, contributing to weight fluctuations.We discussed the importance of regular meals and adequate protein intake for weight loss and overall health. - Consistent protein intake to help with satiety and hunger - Encourage mindful eating and tracking of caloric intake. - Continue using weighted vest  for exercise. - Maintain structured schedule to support weight management.  Type 2 diabetes mellitus Type 2 diabetes is well-managed with an A1c of 5.6.  However, increased insulin  levels suggest a need for tighter control. Previously on metformin  twice daily, currently on once daily. Discussed increasing metformin  to  twice daily to better control insulin  levels. Kidney function allows for the increase in metformin  dosage. - Occasionally taking Zofran  for nausea- Refilled today.  - Continue [name]  15 mg weekly. No refill needed this visit.  - Increase metformin  to 500 mg twice daily, ensuring doses are roughly four hours apart. - Recheck labs in October to assess insulin  levels and A1c. Meds ordered this encounter  Medications   metFORMIN  (GLUCOPHAGE ) 500 MG tablet    Sig: Take 1 tablet (500 mg total) by mouth 2 (two) times daily with a meal.    Dispense:  60 tablet    Refill:  2   ondansetron  (ZOFRAN -ODT) 4 MG disintegrating tablet    Sig: Dissolve 1 tablet (4 mg total) by mouth every 8 (eight) hours as needed for nausea or vomiting.    Dispense:  20 tablet    Refill:  0    Depression with emotional eating Depression with emotional eating is managed with bupropion , which she finds helpful for energy and mood. Currently relaxed due to summer break, with less stress compared to previous work environment. No need to adjust bupropion  dosage at this time. No refills needed this visit  - Continue bupropion  200 mg daily.  Continue to work on emotional eating strategies  Hypertension Hypertension is well-controlled with a recent blood pressure reading of 103/68, the lowest recorded.  Norvasc  10 mg daily,  losartan  100 mg daily,  hydrochlorothiazide  12.5 mg daily  carvedilol  25 mg BID,  KCL 20 mEq TID.  Reports no SE from medication BP Readings from Last 3 Encounters:  04/07/24 103/68  03/09/24 115/76  03/04/24 110/70   Lab Results  Component Value Date   NA 141 02/20/2024   CL 105 02/20/2024   K 3.8 02/20/2024   CO2 27 02/20/2024   BUN 11 02/20/2024   CREATININE 0.85 02/20/2024   GFR 76.94 02/20/2024   CALCIUM  9.2 02/20/2024   PHOS 2.5 07/16/2014   ALBUMIN 4.2 02/20/2024   GLUCOSE 89 02/20/2024   Blood pressure readings are generally lower in the clinic compared to other settings,  possibly due to a more relaxed environment. Plan:  Monitor BP on current medications Continue to work on nutrition plan to promote weight loss and improve BP control.  Monitor for symptoms of hypotension with continued weight loss with treatment with [name] .    Constipation Constipation has been an issue, possibly exacerbated by Zofran  use. She has been using psyllium husk but reports feeling constipated despite increased intake. Ensured adequate hydration, especially when using psyllium, to alleviate constipation. - Continue psyllium husk and ensure adequate hydration.  Vitamin D  Deficiency Vitamin D  is at goal of 50.  Most recent vitamin D  level was 74.67. She is on OTC vitamin D3 2000 IU daily. Lab Results  Component Value Date   VD25OH 74.67 02/20/2024   VD25OH 31.4 12/16/2023   VD25OH 74.02 08/05/2023    Plan: Continue OTC vitamin D3 2000 IU daily- need to confirm dosage Low vitamin D  levels can be associated with adiposity and may result in leptin resistance and weight gain. Also associated with fatigue.  Currently on vitamin D  supplementation without any adverse effects such as nausea, vomiting or muscle weakness.  Vitals Temp: 98.6 F (37 C) BP: 103/68 Pulse Rate: 94 SpO2: 98 %   Anthropometric Measurements Height: 5' 8 (1.727 m) Weight: 276 lb (125.2 kg) BMI (Calculated): 41.98 Weight at Last Visit: 275 lb Weight Lost Since Last Visit: 0 Weight Gained Since Last Visit: 1 lb Starting Weight: 325 lb Total Weight Loss (lbs): 49 lb (22.2 kg) Peak Weight: 330 lb   Body Composition  Body Fat %: 47 % Fat Mass (lbs): 130.4 lbs Muscle Mass (lbs): 139 lbs Total Body Water (lbs): 96.6 lbs Visceral Fat Rating : 15   Other Clinical Data Fasting: no Labs: no Today's Visit #: 108 Starting Date: 05/23/17     ASSESSMENT AND PLAN:  Diet: Ginia is currently in the action stage of change. As such, her goal is to continue with weight loss efforts. She has  agreed to Category 4 Plan.  Exercise: Chinara has been instructed to work up to a goal of 150 minutes of combined cardio and strengthening exercise per week for weight loss and overall health benefits.   Behavior Modification:  We discussed the following Behavioral Modification Strategies today: increasing lean protein intake, decreasing simple carbohydrates, increasing vegetables, increase H2O intake, increase high fiber foods, no skipping meals, emotional eating strategies , avoiding temptations, and planning for success. We discussed various medication options to help Shanay with her weight loss efforts and we both agreed to continue current treatment plan.  Return in about 4 weeks (around 05/05/2024).SABRA She was informed of the importance of frequent follow up visits to maximize her success with intensive lifestyle modifications for her multiple health conditions.  Attestation Statements:   Reviewed by clinician on day of visit: allergies, medications, problem list, medical history, surgical history, family history, social history, and previous encounter notes.   Time spent on visit including pre-visit chart review and post-visit care and charting was 41 minutes.    Deone Omahoney, PA-C

## 2024-04-07 ENCOUNTER — Other Ambulatory Visit (HOSPITAL_BASED_OUTPATIENT_CLINIC_OR_DEPARTMENT_OTHER): Payer: Self-pay

## 2024-04-07 ENCOUNTER — Ambulatory Visit (INDEPENDENT_AMBULATORY_CARE_PROVIDER_SITE_OTHER): Admitting: Physician Assistant

## 2024-04-07 VITALS — BP 103/68 | HR 94 | Temp 98.6°F | Ht 68.0 in | Wt 276.0 lb

## 2024-04-07 DIAGNOSIS — E1165 Type 2 diabetes mellitus with hyperglycemia: Secondary | ICD-10-CM

## 2024-04-07 DIAGNOSIS — F3289 Other specified depressive episodes: Secondary | ICD-10-CM

## 2024-04-07 DIAGNOSIS — E1159 Type 2 diabetes mellitus with other circulatory complications: Secondary | ICD-10-CM | POA: Diagnosis not present

## 2024-04-07 DIAGNOSIS — E669 Obesity, unspecified: Secondary | ICD-10-CM

## 2024-04-07 DIAGNOSIS — Z7985 Long-term (current) use of injectable non-insulin antidiabetic drugs: Secondary | ICD-10-CM

## 2024-04-07 DIAGNOSIS — E559 Vitamin D deficiency, unspecified: Secondary | ICD-10-CM

## 2024-04-07 DIAGNOSIS — F32A Depression, unspecified: Secondary | ICD-10-CM

## 2024-04-07 DIAGNOSIS — E1169 Type 2 diabetes mellitus with other specified complication: Secondary | ICD-10-CM

## 2024-04-07 DIAGNOSIS — F5089 Other specified eating disorder: Secondary | ICD-10-CM

## 2024-04-07 DIAGNOSIS — Z6841 Body Mass Index (BMI) 40.0 and over, adult: Secondary | ICD-10-CM

## 2024-04-07 DIAGNOSIS — Z7984 Long term (current) use of oral hypoglycemic drugs: Secondary | ICD-10-CM

## 2024-04-07 DIAGNOSIS — I152 Hypertension secondary to endocrine disorders: Secondary | ICD-10-CM | POA: Diagnosis not present

## 2024-04-07 DIAGNOSIS — R110 Nausea: Secondary | ICD-10-CM

## 2024-04-07 DIAGNOSIS — K5900 Constipation, unspecified: Secondary | ICD-10-CM

## 2024-04-07 MED ORDER — METFORMIN HCL 500 MG PO TABS
500.0000 mg | ORAL_TABLET | Freq: Two times a day (BID) | ORAL | 2 refills | Status: DC
  Filled 2024-04-07 – 2024-05-05 (×2): qty 60, 30d supply, fill #0
  Filled 2024-05-31: qty 60, 30d supply, fill #1
  Filled 2024-07-01: qty 60, 30d supply, fill #2

## 2024-04-07 MED ORDER — ONDANSETRON 4 MG PO TBDP
4.0000 mg | ORAL_TABLET | Freq: Three times a day (TID) | ORAL | 0 refills | Status: DC | PRN
  Filled 2024-04-07: qty 20, 7d supply, fill #0

## 2024-04-11 ENCOUNTER — Other Ambulatory Visit: Payer: Self-pay | Admitting: Allergy & Immunology

## 2024-04-14 ENCOUNTER — Other Ambulatory Visit (HOSPITAL_BASED_OUTPATIENT_CLINIC_OR_DEPARTMENT_OTHER): Payer: Self-pay

## 2024-05-05 ENCOUNTER — Encounter (INDEPENDENT_AMBULATORY_CARE_PROVIDER_SITE_OTHER): Payer: Self-pay | Admitting: Physician Assistant

## 2024-05-05 ENCOUNTER — Ambulatory Visit (INDEPENDENT_AMBULATORY_CARE_PROVIDER_SITE_OTHER): Admitting: Physician Assistant

## 2024-05-05 ENCOUNTER — Other Ambulatory Visit: Payer: Self-pay | Admitting: Family Medicine

## 2024-05-05 ENCOUNTER — Other Ambulatory Visit (HOSPITAL_BASED_OUTPATIENT_CLINIC_OR_DEPARTMENT_OTHER): Payer: Self-pay

## 2024-05-05 VITALS — BP 120/76 | HR 97 | Temp 98.7°F | Ht 68.0 in | Wt 279.0 lb

## 2024-05-05 DIAGNOSIS — E1169 Type 2 diabetes mellitus with other specified complication: Secondary | ICD-10-CM

## 2024-05-05 DIAGNOSIS — E1159 Type 2 diabetes mellitus with other circulatory complications: Secondary | ICD-10-CM

## 2024-05-05 DIAGNOSIS — Z6841 Body Mass Index (BMI) 40.0 and over, adult: Secondary | ICD-10-CM

## 2024-05-05 DIAGNOSIS — E669 Obesity, unspecified: Secondary | ICD-10-CM

## 2024-05-05 DIAGNOSIS — E785 Hyperlipidemia, unspecified: Secondary | ICD-10-CM

## 2024-05-05 DIAGNOSIS — R110 Nausea: Secondary | ICD-10-CM

## 2024-05-05 DIAGNOSIS — Z7985 Long-term (current) use of injectable non-insulin antidiabetic drugs: Secondary | ICD-10-CM

## 2024-05-05 DIAGNOSIS — Z7984 Long term (current) use of oral hypoglycemic drugs: Secondary | ICD-10-CM

## 2024-05-05 DIAGNOSIS — F3289 Other specified depressive episodes: Secondary | ICD-10-CM

## 2024-05-05 DIAGNOSIS — I152 Hypertension secondary to endocrine disorders: Secondary | ICD-10-CM

## 2024-05-05 MED ORDER — BUPROPION HCL ER (SR) 200 MG PO TB12
200.0000 mg | ORAL_TABLET | Freq: Every morning | ORAL | 0 refills | Status: DC
Start: 2024-05-05 — End: 2024-07-29
  Filled 2024-05-05 – 2024-06-05 (×2): qty 90, 90d supply, fill #0

## 2024-05-05 MED ORDER — HYDROCHLOROTHIAZIDE 12.5 MG PO CAPS
12.5000 mg | ORAL_CAPSULE | Freq: Every day | ORAL | 1 refills | Status: AC
  Filled 2024-05-05: qty 90, 90d supply, fill #0
  Filled 2024-08-04: qty 90, 90d supply, fill #1

## 2024-05-05 MED ORDER — ONDANSETRON 4 MG PO TBDP
4.0000 mg | ORAL_TABLET | Freq: Three times a day (TID) | ORAL | 0 refills | Status: DC | PRN
  Filled 2024-05-05: qty 18, 6d supply, fill #0

## 2024-05-05 NOTE — Progress Notes (Signed)
 SUBJECTIVE: Discussed the use of AI scribe software for clinical note transcription with the patient, who gave verbal consent to proceed.  Chief Complaint: Obesity  Interim History: She is up 3 lbs since her last visit.  Down 46 lbs overall TBW loss of 14.2%  Brandi Lester is here to discuss her progress with her obesity treatment plan. She is on the Category 4 Plan and states she is following her eating plan approximately 85 % of the time. She states she is exercising cardio/weights 60 minutes 4 times per week.  Brandi Lester is a 58 year old female who presents for follow-up on her obesity treatment plan.  She has been actively engaging in a new exercise regimen, attending [employer] every other day, focusing on 45 minutes of cardio and weight training. Her dietary habits include skipping breakfast due to lack of hunger and consuming a lunch of chicken salad and an apple. She occasionally eats out, preferring low-sodium deli meats, and uses protein shakes for breakfast, tuna packets for lunch, and salads with chicken for dinner. She has been trying to incorporate more vegetables and fiber into her diet, using psyllium husk capsules, but has reduced intake due to increased bowel movements.  Her type 2 diabetes is managed with metformin  500 mg BID and [name]  15 mg weekly, with no missed doses reported. She is not experiencing excessive hunger. She also takes bupropion  200 mg daily for emotional eating. She describes an incident of vomiting after consuming beer and orange juice, suspecting it was due to what she had eaten and not related to medication.   Hypertension is managed with amlodipine  10 mg daily, hydrochlorothiazide  12.5 mg daily, and losartan  100 mg daily. She is also on Lipitor 10 mg daily for hyperlipidemia. She has a history of vitamin D  deficiency and emotional eating behavior.   No excessive hunger and sometimes skips meals, but then gets necessary protein and overall  calories at subsequent meal. She reports occasional cramping. No significant changes in her menstrual cycle, described as 'kind of been normal.' OBJECTIVE: Visit Diagnoses: Problem List Items Addressed This Visit     Hyperlipidemia associated with type 2 diabetes mellitus (HCC)   Vitamin D  deficiency   Depression   Relevant Medications   buPROPion  (WELLBUTRIN  SR) 200 MG 12 hr tablet   Hypertension associated with type 2 diabetes mellitus (HCC)   Diabetes mellitus (HCC) - Primary   BMI 40.0-44.9, adult (HCC) Current BMI 40.1   Obesity (HCC)- Start BMI 47.99   Other Visit Diagnoses       Nausea       Relevant Medications   ondansetron  (ZOFRAN -ODT) 4 MG disintegrating tablet     Obesity She has gained three pounds since the last visit, possibly due to muscle gain from increased exercise. She is exercising every other day, focusing on cardio and weight training. Appetite suppression from [name]  may be contributing to her not feeling excessively hungry. - Reverse exercise order to weight training before cardio to promote fat loss and muscle gain - Reduce calorie intake to category three to create a calorie deficit - Monitor weight and exercise impact over the next month  Type 2 diabetes mellitus Type 2 diabetes is managed with metformin  500 mg BID and [name]  15 mg weekly. She reports not missing any doses of [name] . Some increased stooling/loose stools with increased metformin  Denies mass in neck, dysphagia, dyspepsia, persistent hoarseness, abdominal pain, or N/V/Constipation or diarrhea. Has annual eye exam. Mood is stable.   Lab  Results  Component Value Date   HGBA1C 5.6 03/09/2024   HGBA1C 5.3 12/16/2023   HGBA1C 5.6 08/05/2023   Lab Results  Component Value Date   MICROALBUR 4.0 (H) 02/20/2024   LDLCALC 57 02/20/2024   CREATININE 0.85 02/20/2024   She is working  on nutrition plan to decrease simple carbohydrates, increase lean proteins and exercise to promote  weight loss and improve glycemic control . Continue [name]  15 mg weekly Continue Metformin  500 mg BID - Monitor the impact of exercise and dietary changes on diabetes management  Hypertension Hypertension is managed with amlodipine  10 mg daily, hydrochlorothiazide  12.5 mg daily, and losartan  100 mg daily.No SE BP Readings from Last 3 Encounters:  05/05/24 120/76  04/07/24 103/68  03/09/24 115/76   Plan:  Continue usual medication Continue to work on nutrition plan to promote weight loss and improve BP control.    Hyperlipidemia Hyperlipidemia is managed with Lipitor 10 mg daily. And [name]  15 mg weekly.  Last lipids Lab Results  Component Value Date   CHOL 109 02/20/2024   HDL 36.20 (L) 02/20/2024   LDLCALC 57 02/20/2024   TRIG 80.0 02/20/2024   CHOLHDL 3 02/20/2024   LDL at goal. HDL below goal. Trig at goal.   Plan: Continue to work on nutrition plan -decreasing simple carbohydrates, increasing lean proteins, decreasing saturated fats and cholesterol , avoiding trans fats and exercise as able to promote weight loss, improve lipids and decrease cardiovascular risks. Continue lipitor and [name]    Emotional eating behavior Emotional eating behavior is managed with bupropion  200 mg daily. Dietary habits and potential appetite suppression from [name]  were discussed. Continue Wellbutrin  and refill.  Meds ordered this encounter  Medications   buPROPion  (WELLBUTRIN  SR) 200 MG 12 hr tablet    Sig: Take 1 tablet (200 mg total) by mouth in the morning.    Dispense:  90 tablet    Refill:  0   ondansetron  (ZOFRAN -ODT) 4 MG disintegrating tablet    Sig: Dissolve 1 tablet (4 mg total) by mouth every 8 (eight) hours as needed for nausea or vomiting.    Dispense:  20 tablet    Refill:  0     Menopausal symptoms She reports some cramping but no significant changes in menopausal symptoms. - Monitor menopausal symptoms and consider further intervention if symptoms become  unpredictable or bothersome  Nausea, episodic She experienced an episode of nausea and vomiting, possibly related to dietary intake (beer and orange juice). She uses Zofran  as needed for nausea, particularly in situations where she needs to avoid nausea, such as during an open house event. Reassurance about the safety of Zofran  use was provided, and potential side effects, including constipation, were discussed. - Continue/refill Zofran  as needed for nausea - Monitor for constipation Meds ordered this encounter  Medications   buPROPion  (WELLBUTRIN  SR) 200 MG 12 hr tablet    Sig: Take 1 tablet (200 mg total) by mouth in the morning.    Dispense:  90 tablet    Refill:  0   ondansetron  (ZOFRAN -ODT) 4 MG disintegrating tablet    Sig: Dissolve 1 tablet (4 mg total) by mouth every 8 (eight) hours as needed for nausea or vomiting.    Dispense:  20 tablet    Refill:  0    Follow-Up Follow-up plans were discussed to monitor the impact of dietary and exercise changes, as well as to perform necessary lab tests. - Schedule follow-up appointment on October 29th at 4 PM - Fast from  9 AM on the day of the next appointment for lab tests - Monitor weight and dietary impact over the next month  Vitals Temp: 98.7 F (37.1 C) BP: 120/76 Pulse Rate: 97 SpO2: 99 %   Anthropometric Measurements Height: 5' 8 (1.727 m) Weight: 279 lb (126.6 kg) BMI (Calculated): 42.43 Weight at Last Visit: 276 lb Weight Lost Since Last Visit: 0 Weight Gained Since Last Visit: 3 lb Starting Weight: 325 lb Total Weight Loss (lbs): 46 lb (20.9 kg) Peak Weight: 330 lb   Body Composition  Body Fat %: 50.7 % Fat Mass (lbs): 141.4 lbs Muscle Mass (lbs): 130.6 lbs Total Body Water (lbs): 105.4 lbs Visceral Fat Rating : 16   Other Clinical Data Fasting: no Labs: no Today's Visit #: 109 Starting Date: 05/23/17     ASSESSMENT AND PLAN:  Diet: Ivery is currently in the action stage of change. As such,  her goal is to continue with weight loss efforts. She has agreed to Category 3 Plan.  Exercise: Bennye has been instructed to continue exercising as is for weight loss and overall health benefits.   Behavior Modification:  We discussed the following Behavioral Modification Strategies today: increasing lean protein intake, decreasing simple carbohydrates, increasing vegetables, increase H2O intake, increase high fiber foods, meal planning and cooking strategies, emotional eating strategies , avoiding temptations, and planning for success. We discussed various medication options to help Ariah with her weight loss efforts and we both agreed to continue current treatment plan but change to Category 3 nutrition plan.  Return in about 4 weeks (around 06/02/2024) for Fasting Lab.SABRA She was informed of the importance of frequent follow up visits to maximize her success with intensive lifestyle modifications for her multiple health conditions.  Attestation Statements:   Reviewed by clinician on day of visit: allergies, medications, problem list, medical history, surgical history, family history, social history, and previous encounter notes.   Time spent on visit including pre-visit chart review and post-visit care and charting was 41 minutes.    Kaulana Brindle, PA-C

## 2024-05-06 ENCOUNTER — Other Ambulatory Visit (HOSPITAL_BASED_OUTPATIENT_CLINIC_OR_DEPARTMENT_OTHER): Payer: Self-pay

## 2024-05-23 ENCOUNTER — Other Ambulatory Visit: Payer: Self-pay | Admitting: Cardiology

## 2024-05-23 DIAGNOSIS — I152 Hypertension secondary to endocrine disorders: Secondary | ICD-10-CM

## 2024-05-25 ENCOUNTER — Other Ambulatory Visit (HOSPITAL_BASED_OUTPATIENT_CLINIC_OR_DEPARTMENT_OTHER): Payer: Self-pay

## 2024-05-25 MED ORDER — AMLODIPINE BESYLATE 10 MG PO TABS
10.0000 mg | ORAL_TABLET | Freq: Every day | ORAL | 2 refills | Status: AC
  Filled 2024-05-25: qty 90, 90d supply, fill #0
  Filled 2024-08-24: qty 90, 90d supply, fill #1

## 2024-05-31 NOTE — Progress Notes (Unsigned)
 SUBJECTIVE: Discussed the use of AI scribe software for clinical note transcription with the patient, who gave verbal consent to proceed.  Chief Complaint: Obesity  Interim History: She is up 1 lb since last visit Down 49 lbs overall TBW loss of 15.1%   [name] is here to discuss her progress with her obesity treatment plan. She is on the Category 4 Plan and states she is following her eating plan approximately 30 % of the time. She states she is exercising walking/bike 30-45 minutes 3-4 times per week.  Brandi Lester is a 58 year old female with obesity, type 2 diabetes,hyperlipidemia and hypertension who presents for follow-up on her obesity treatment plan.  She is actively managing her weight through dietary changes and physical activity. She recently attended a food truck event and consumed high-calorie beverages like pina colada lemonade, which she believes contributes to her calorie intake. She acknowledges that she may be consuming excess calories through drinks and is trying to be more mindful.  She uses a weighted vest during exercise and notes changes in her exercise routine over the summer.  Her type 2 diabetes is managed with [name]  15 mg weekly and metformin  500 mg once a day. Her A1c has been stable, she has elevated insulin  levels.   She notes that her recent blood pressure readings, including a reading of 103/68, have been lower than usual. Previously we tried to decrease her BP medications, but her BP went back up and medication was increased.   She has a history of depression and emotional eating, for which she is taking bupropion . She feels that her current dosage is effective, especially since she is less stressed during the summer. She has been skipping meals occasionally and tries to compensate with protein shakes, although she sometimes struggles to finish them.We discussed the importance of regular meals and adequate protein intake for weight loss and overall  health.  She experiences occasional constipation, which she knows can be worsened by Zofran  use. She occasionally takes Zofran  for nausea.   She has been taking psyllium husk to manage constipation and ensures she stays hydrated, drinking water with liquid IV regularly.  OBJECTIVE: Visit Diagnoses: Problem List Items Addressed This Visit     Vitamin D  deficiency   Depression   Hypertension associated with type 2 diabetes mellitus (HCC)   Relevant Medications   metFORMIN  (GLUCOPHAGE ) 500 MG tablet   Diabetes mellitus (HCC) - Primary   Relevant Medications   metFORMIN  (GLUCOPHAGE ) 500 MG tablet   BMI 40.0-44.9, adult (HCC) Current BMI 40.1   Relevant Medications   metFORMIN  (GLUCOPHAGE ) 500 MG tablet   Obesity (HCC)- Start BMI 47.99   Relevant Medications   metFORMIN  (GLUCOPHAGE ) 500 MG tablet   Other Visit Diagnoses       Nausea       Relevant Medications   ondansetron  (ZOFRAN -ODT) 4 MG disintegrating tablet     Constipation, unspecified constipation type         Obesity Obesity management is ongoing with the use of a weighted vest for exercise, maintaining muscle mass and reducing body adipose percentage. However, a slight increase in adipose percentage indicates a need for more mindful eating habits. She acknowledges consuming high-calorie drinks and occasional sweets, contributing to weight fluctuations.We discussed the importance of regular meals and adequate protein intake for weight loss and overall health. - Consistent protein intake to help with satiety and hunger - Encourage mindful eating and tracking of caloric intake. - Continue using weighted vest  for exercise. - Maintain structured schedule to support weight management.  Type 2 diabetes mellitus Type 2 diabetes is well-managed with an A1c of 5.6.  However, increased insulin  levels suggest a need for tighter control. Previously on metformin  twice daily, currently on once daily. Discussed increasing metformin  to  twice daily to better control insulin  levels. Kidney function allows for the increase in metformin  dosage. - Occasionally taking Zofran  for nausea- Refilled today.  - Continue [name]  15 mg weekly. No refill needed this visit.  - Increase metformin  to 500 mg twice daily, ensuring doses are roughly four hours apart. - Recheck labs in October to assess insulin  levels and A1c. Meds ordered this encounter  Medications   metFORMIN  (GLUCOPHAGE ) 500 MG tablet    Sig: Take 1 tablet (500 mg total) by mouth 2 (two) times daily with a meal.    Dispense:  60 tablet    Refill:  2   ondansetron  (ZOFRAN -ODT) 4 MG disintegrating tablet    Sig: Dissolve 1 tablet (4 mg total) by mouth every 8 (eight) hours as needed for nausea or vomiting.    Dispense:  20 tablet    Refill:  0    Depression with emotional eating Depression with emotional eating is managed with bupropion , which she finds helpful for energy and mood. Currently relaxed due to summer break, with less stress compared to previous work environment. No need to adjust bupropion  dosage at this time. No refills needed this visit  - Continue bupropion  200 mg daily.  Continue to work on emotional eating strategies  Hypertension Hypertension is well-controlled with a recent blood pressure reading of 103/68, the lowest recorded.  Norvasc  10 mg daily,  losartan  100 mg daily,  hydrochlorothiazide  12.5 mg daily  carvedilol  25 mg BID,  KCL 20 mEq TID.  Reports no SE from medication BP Readings from Last 3 Encounters:  04/07/24 103/68  03/09/24 115/76  03/04/24 110/70   Lab Results  Component Value Date   NA 141 02/20/2024   CL 105 02/20/2024   K 3.8 02/20/2024   CO2 27 02/20/2024   BUN 11 02/20/2024   CREATININE 0.85 02/20/2024   GFR 76.94 02/20/2024   CALCIUM  9.2 02/20/2024   PHOS 2.5 07/16/2014   ALBUMIN 4.2 02/20/2024   GLUCOSE 89 02/20/2024   Blood pressure readings are generally lower in the clinic compared to other settings,  possibly due to a more relaxed environment. Plan:  Monitor BP on current medications Continue to work on nutrition plan to promote weight loss and improve BP control.  Monitor for symptoms of hypotension with continued weight loss with treatment with [name] .    Constipation Constipation has been an issue, possibly exacerbated by Zofran  use. She has been using psyllium husk but reports feeling constipated despite increased intake. Ensured adequate hydration, especially when using psyllium, to alleviate constipation. - Continue psyllium husk and ensure adequate hydration.  Vitamin D  Deficiency Vitamin D  is at goal of 50.  Most recent vitamin D  level was 74.67. She is on OTC vitamin D3 2000 IU daily. Lab Results  Component Value Date   VD25OH 74.67 02/20/2024   VD25OH 31.4 12/16/2023   VD25OH 74.02 08/05/2023    Plan: Continue OTC vitamin D3 2000 IU daily- need to confirm dosage Low vitamin D  levels can be associated with adiposity and may result in leptin resistance and weight gain. Also associated with fatigue.  Currently on vitamin D  supplementation without any adverse effects such as nausea, vomiting or muscle weakness.  Vitals Temp: 98.6 F (37 C) BP: 103/68 Pulse Rate: 94 SpO2: 98 %   Anthropometric Measurements Height: 5' 8 (1.727 m) Weight: 276 lb (125.2 kg) BMI (Calculated): 41.98 Weight at Last Visit: 275 lb Weight Lost Since Last Visit: 0 Weight Gained Since Last Visit: 1 lb Starting Weight: 325 lb Total Weight Loss (lbs): 49 lb (22.2 kg) Peak Weight: 330 lb   Body Composition  Body Fat %: 47 % Fat Mass (lbs): 130.4 lbs Muscle Mass (lbs): 139 lbs Total Body Water (lbs): 96.6 lbs Visceral Fat Rating : 15   Other Clinical Data Fasting: no Labs: no Today's Visit #: 108 Starting Date: 05/23/17     ASSESSMENT AND PLAN:  Diet: Ginia is currently in the action stage of change. As such, her goal is to continue with weight loss efforts. She has  agreed to Category 4 Plan.  Exercise: Chinara has been instructed to work up to a goal of 150 minutes of combined cardio and strengthening exercise per week for weight loss and overall health benefits.   Behavior Modification:  We discussed the following Behavioral Modification Strategies today: increasing lean protein intake, decreasing simple carbohydrates, increasing vegetables, increase H2O intake, increase high fiber foods, no skipping meals, emotional eating strategies , avoiding temptations, and planning for success. We discussed various medication options to help Shanay with her weight loss efforts and we both agreed to continue current treatment plan.  Return in about 4 weeks (around 05/05/2024).SABRA She was informed of the importance of frequent follow up visits to maximize her success with intensive lifestyle modifications for her multiple health conditions.  Attestation Statements:   Reviewed by clinician on day of visit: allergies, medications, problem list, medical history, surgical history, family history, social history, and previous encounter notes.   Time spent on visit including pre-visit chart review and post-visit care and charting was 41 minutes.    Deone Omahoney, PA-C

## 2024-06-01 ENCOUNTER — Encounter (INDEPENDENT_AMBULATORY_CARE_PROVIDER_SITE_OTHER): Payer: Self-pay | Admitting: Physician Assistant

## 2024-06-01 ENCOUNTER — Ambulatory Visit (INDEPENDENT_AMBULATORY_CARE_PROVIDER_SITE_OTHER): Admitting: Physician Assistant

## 2024-06-01 ENCOUNTER — Other Ambulatory Visit (HOSPITAL_BASED_OUTPATIENT_CLINIC_OR_DEPARTMENT_OTHER): Payer: Self-pay

## 2024-06-01 VITALS — BP 112/74 | HR 91 | Temp 98.3°F | Ht 68.0 in | Wt 272.0 lb

## 2024-06-01 DIAGNOSIS — Z7985 Long-term (current) use of injectable non-insulin antidiabetic drugs: Secondary | ICD-10-CM

## 2024-06-01 DIAGNOSIS — E559 Vitamin D deficiency, unspecified: Secondary | ICD-10-CM

## 2024-06-01 DIAGNOSIS — E1159 Type 2 diabetes mellitus with other circulatory complications: Secondary | ICD-10-CM | POA: Diagnosis not present

## 2024-06-01 DIAGNOSIS — E669 Obesity, unspecified: Secondary | ICD-10-CM

## 2024-06-01 DIAGNOSIS — F3289 Other specified depressive episodes: Secondary | ICD-10-CM

## 2024-06-01 DIAGNOSIS — I152 Hypertension secondary to endocrine disorders: Secondary | ICD-10-CM

## 2024-06-01 DIAGNOSIS — Z7984 Long term (current) use of oral hypoglycemic drugs: Secondary | ICD-10-CM

## 2024-06-01 DIAGNOSIS — E785 Hyperlipidemia, unspecified: Secondary | ICD-10-CM | POA: Diagnosis not present

## 2024-06-01 DIAGNOSIS — Z6841 Body Mass Index (BMI) 40.0 and over, adult: Secondary | ICD-10-CM

## 2024-06-01 DIAGNOSIS — E1169 Type 2 diabetes mellitus with other specified complication: Secondary | ICD-10-CM

## 2024-06-01 MED ORDER — TIRZEPATIDE 15 MG/0.5ML ~~LOC~~ SOAJ
15.0000 mg | SUBCUTANEOUS | 0 refills | Status: DC
  Filled 2024-06-01: qty 6, 84d supply, fill #0

## 2024-06-02 LAB — HEMOGLOBIN A1C
Est. average glucose Bld gHb Est-mCnc: 111 mg/dL
Hgb A1c MFr Bld: 5.5 % (ref 4.8–5.6)

## 2024-06-02 LAB — CMP14+EGFR
ALT: 11 [IU]/L (ref 0–32)
AST: 15 [IU]/L (ref 0–40)
Albumin: 4.5 g/dL (ref 3.8–4.9)
Alkaline Phosphatase: 103 [IU]/L (ref 49–135)
BUN/Creatinine Ratio: 13 (ref 9–23)
BUN: 17 mg/dL (ref 6–24)
Bilirubin Total: 0.4 mg/dL (ref 0.0–1.2)
CO2: 21 mmol/L (ref 20–29)
Calcium: 10.1 mg/dL (ref 8.7–10.2)
Chloride: 100 mmol/L (ref 96–106)
Creatinine, Ser: 1.27 mg/dL — ABNORMAL HIGH (ref 0.57–1.00)
Globulin, Total: 2.9 g/dL (ref 1.5–4.5)
Glucose: 74 mg/dL (ref 70–99)
Potassium: 4 mmol/L (ref 3.5–5.2)
Sodium: 138 mmol/L (ref 134–144)
Total Protein: 7.4 g/dL (ref 6.0–8.5)
eGFR: 50 mL/min/{1.73_m2} — ABNORMAL LOW

## 2024-06-02 LAB — INSULIN, RANDOM: INSULIN: 13.6 u[IU]/mL (ref 2.6–24.9)

## 2024-06-02 LAB — LIPID PANEL WITH LDL/HDL RATIO
Cholesterol, Total: 121 mg/dL (ref 100–199)
HDL: 38 mg/dL — ABNORMAL LOW
LDL Chol Calc (NIH): 64 mg/dL (ref 0–99)
LDL/HDL Ratio: 1.7 ratio (ref 0.0–3.2)
Triglycerides: 104 mg/dL (ref 0–149)
VLDL Cholesterol Cal: 19 mg/dL (ref 5–40)

## 2024-06-02 LAB — VITAMIN D 25 HYDROXY (VIT D DEFICIENCY, FRACTURES): Vit D, 25-Hydroxy: 64.2 ng/mL (ref 30.0–100.0)

## 2024-06-02 LAB — VITAMIN B12: Vitamin B-12: >2000 pg/mL — ABNORMAL HIGH (ref 232–1245)

## 2024-06-05 ENCOUNTER — Other Ambulatory Visit (HOSPITAL_BASED_OUTPATIENT_CLINIC_OR_DEPARTMENT_OTHER): Payer: Self-pay

## 2024-06-23 ENCOUNTER — Other Ambulatory Visit: Payer: Self-pay

## 2024-06-23 ENCOUNTER — Other Ambulatory Visit: Payer: Self-pay | Admitting: Family Medicine

## 2024-06-23 ENCOUNTER — Other Ambulatory Visit: Payer: Self-pay | Admitting: Obstetrics and Gynecology

## 2024-06-23 ENCOUNTER — Other Ambulatory Visit (HOSPITAL_BASED_OUTPATIENT_CLINIC_OR_DEPARTMENT_OTHER): Payer: Self-pay

## 2024-06-23 DIAGNOSIS — T7840XA Allergy, unspecified, initial encounter: Secondary | ICD-10-CM

## 2024-06-23 DIAGNOSIS — I10 Essential (primary) hypertension: Secondary | ICD-10-CM

## 2024-06-23 DIAGNOSIS — E876 Hypokalemia: Secondary | ICD-10-CM

## 2024-06-23 DIAGNOSIS — N939 Abnormal uterine and vaginal bleeding, unspecified: Secondary | ICD-10-CM

## 2024-06-23 MED ORDER — POTASSIUM CHLORIDE CRYS ER 20 MEQ PO TBCR
20.0000 meq | EXTENDED_RELEASE_TABLET | Freq: Three times a day (TID) | ORAL | 0 refills | Status: DC
  Filled 2024-06-23: qty 270, 90d supply, fill #0

## 2024-06-25 ENCOUNTER — Other Ambulatory Visit (HOSPITAL_BASED_OUTPATIENT_CLINIC_OR_DEPARTMENT_OTHER): Payer: Self-pay

## 2024-06-26 ENCOUNTER — Other Ambulatory Visit (HOSPITAL_BASED_OUTPATIENT_CLINIC_OR_DEPARTMENT_OTHER): Payer: Self-pay

## 2024-06-26 MED ORDER — FLUZONE 0.5 ML IM SUSY
0.5000 mL | PREFILLED_SYRINGE | Freq: Once | INTRAMUSCULAR | 0 refills | Status: AC
  Filled 2024-06-26: qty 0.5, 1d supply, fill #0

## 2024-06-29 ENCOUNTER — Other Ambulatory Visit: Payer: Self-pay

## 2024-06-30 ENCOUNTER — Other Ambulatory Visit (HOSPITAL_BASED_OUTPATIENT_CLINIC_OR_DEPARTMENT_OTHER): Payer: Self-pay

## 2024-06-30 ENCOUNTER — Other Ambulatory Visit: Payer: Self-pay

## 2024-06-30 DIAGNOSIS — N939 Abnormal uterine and vaginal bleeding, unspecified: Secondary | ICD-10-CM

## 2024-06-30 MED ORDER — MEDROXYPROGESTERONE ACETATE 10 MG PO TABS
10.0000 mg | ORAL_TABLET | Freq: Every day | ORAL | 1 refills | Status: DC
  Filled 2024-06-30: qty 30, 30d supply, fill #0

## 2024-07-01 ENCOUNTER — Other Ambulatory Visit (HOSPITAL_BASED_OUTPATIENT_CLINIC_OR_DEPARTMENT_OTHER): Payer: Self-pay

## 2024-07-01 ENCOUNTER — Encounter (INDEPENDENT_AMBULATORY_CARE_PROVIDER_SITE_OTHER): Payer: Self-pay | Admitting: Physician Assistant

## 2024-07-01 ENCOUNTER — Ambulatory Visit (INDEPENDENT_AMBULATORY_CARE_PROVIDER_SITE_OTHER): Admitting: Physician Assistant

## 2024-07-01 VITALS — BP 121/79 | HR 95 | Temp 99.4°F | Ht 68.0 in | Wt 274.0 lb

## 2024-07-01 DIAGNOSIS — E785 Hyperlipidemia, unspecified: Secondary | ICD-10-CM | POA: Diagnosis not present

## 2024-07-01 DIAGNOSIS — R7989 Other specified abnormal findings of blood chemistry: Secondary | ICD-10-CM

## 2024-07-01 DIAGNOSIS — R6889 Other general symptoms and signs: Secondary | ICD-10-CM

## 2024-07-01 DIAGNOSIS — E669 Obesity, unspecified: Secondary | ICD-10-CM

## 2024-07-01 DIAGNOSIS — E1169 Type 2 diabetes mellitus with other specified complication: Secondary | ICD-10-CM

## 2024-07-01 DIAGNOSIS — R110 Nausea: Secondary | ICD-10-CM

## 2024-07-01 DIAGNOSIS — E1159 Type 2 diabetes mellitus with other circulatory complications: Secondary | ICD-10-CM

## 2024-07-01 DIAGNOSIS — Z7985 Long-term (current) use of injectable non-insulin antidiabetic drugs: Secondary | ICD-10-CM

## 2024-07-01 DIAGNOSIS — E559 Vitamin D deficiency, unspecified: Secondary | ICD-10-CM

## 2024-07-01 DIAGNOSIS — R6883 Chills (without fever): Secondary | ICD-10-CM

## 2024-07-01 DIAGNOSIS — Z6841 Body Mass Index (BMI) 40.0 and over, adult: Secondary | ICD-10-CM

## 2024-07-01 DIAGNOSIS — I152 Hypertension secondary to endocrine disorders: Secondary | ICD-10-CM

## 2024-07-01 MED ORDER — ONDANSETRON 4 MG PO TBDP
4.0000 mg | ORAL_TABLET | Freq: Three times a day (TID) | ORAL | 0 refills | Status: DC | PRN
  Filled 2024-07-01: qty 18, 6d supply, fill #0

## 2024-07-01 MED ORDER — TIRZEPATIDE 12.5 MG/0.5ML ~~LOC~~ SOAJ
12.5000 mg | SUBCUTANEOUS | 1 refills | Status: DC
  Filled 2024-07-01: qty 2, 28d supply, fill #0

## 2024-07-01 NOTE — Progress Notes (Signed)
 SUBJECTIVE: Discussed the use of AI scribe software for clinical note transcription with the patient, who gave verbal consent to proceed.  Chief Complaint: Obesity  Interim History: She is up 2 lbs since her last visit.  Down 51 lbs overall TBW Loss of 15.7%  Brandi Lester is here to discuss her progress with her obesity treatment plan. She is on the Category 3 Plan and states she is following her eating plan approximately 80 % of the time. She states she is exercising treadmill/exercise bike/weights 35-40 minutes 4-5 times per week.  Brandi Lester is a 58 year old female with type 2 diabetes who presents for routine follow-up and lab review.  Her type 2 diabetes is well-controlled with a recent A1c of 5.5%. She is on metformin  and her insulin  level has improved to 13.6. She is also managing slightly elevated LDL cholesterol with atorvastatin  and is monitoring her saturated fat intake.  Recent lab work indicated an elevated creatinine level; the labs were drawn in the afternoon. She reports that she needs to drink more water and has been reducing her use of Aleve , opting for Tylenol  as needed. Liver function tests and cholesterol levels are normal.  She has gained 8 pounds since April 2025 despite regular gym attendance. She is increasing her protein intake to 100 grams per day using protein shakes and high-protein snacks but does report not feeling hungry most of the time and feels she is under eating protein and overall calories.   She frequently feels cold and is concerned about her iron levels, which have not been checked recently. She has lost 51 pounds overall and is managing her weight with gym workouts and dietary adjustments.  Her blood pressure is well-controlled with losartan , hydrochlorothiazide , and amlodipine . She uses Zofran  as needed for nausea, particularly after her weekly injection on Sundays, and manages occasional constipation with fiber supplements and apples.  She  is preparing for dental work on November 11th, involving a tooth extraction and bridge placement due to a cracked tooth, and anticipates needing to adjust her diet post-procedure. OBJECTIVE: Visit Diagnoses: Problem List Items Addressed This Visit     Hyperlipidemia associated with type 2 diabetes mellitus (HCC)   Relevant Medications   tirzepatide  ([name] ) 12.5 MG/0.5ML Pen   Vitamin D  deficiency   Hypertension associated with type 2 diabetes mellitus (HCC)   Relevant Medications   tirzepatide  ([name] ) 12.5 MG/0.5ML Pen   Diabetes mellitus (HCC) - Primary   Relevant Medications   tirzepatide  ([name] ) 12.5 MG/0.5ML Pen   BMI 40.0-44.9, adult (HCC) Current BMI 40.1   Relevant Medications   tirzepatide  ([name] ) 12.5 MG/0.5ML Pen   Obesity (HCC)- Start BMI 47.99   Relevant Medications   tirzepatide  ([name] ) 12.5 MG/0.5ML Pen   Other Visit Diagnoses       Nausea       Relevant Medications   ondansetron  (ZOFRAN -ODT) 4 MG disintegrating tablet     Obesity Weight gain of 8 pounds since April after losing 59 pounds at that time. She is now down 51 lbs overall . Decrease in muscle mass possibly due to insufficient protein intake. REE 1858 12/16/23 May want to repeat REE soon as well.  Discussed decreasing [name]  to 12.5 mg weekly to hopefully increase appetite and intake.  - Encourage protein intake to at least 100 grams per day. - Continue gym workouts, focusing on weight training before cardio.  Type 2 diabetes mellitus Well-controlled with an A1c of 5.5%. Insulin  levels improving at 13.6 On [name]  15  mg weekly. Denies mass in neck, dysphagia, dyspepsia, persistent hoarseness, abdominal pain, or N/V/Constipation or diarrhea. Has annual eye exam. Mood is stable.  Some nausea controlled with Zofran .  Lab Results  Component Value Date   HGBA1C 5.5 06/01/2024   HGBA1C 5.6 03/09/2024   HGBA1C 5.3 12/16/2023   Lab Results  Component Value Date   MICROALBUR 4.0 (H)  02/20/2024   LDLCALC 64 06/01/2024   CREATININE 1.27 (H) 06/01/2024   INSULIN   Date Value Ref Range Status  06/01/2024 13.6 2.6 - 24.9 uIU/mL Final  ]She is working  on nutrition plan to decrease simple carbohydrates, increase lean proteins and exercise to promote weight loss and improve glycemic control . - Reduce [name]  dosage to 12.5 mg to stimulate appetite and encourage protein intake. - Continue monitoring A1c and insulin  levels. Meds ordered this encounter  Medications   ondansetron  (ZOFRAN -ODT) 4 MG disintegrating tablet    Sig: Dissolve 1 tablet (4 mg total) by mouth every 8 (eight) hours as needed for nausea or vomiting.    Dispense:  20 tablet    Refill:  0   tirzepatide  ([name] ) 12.5 MG/0.5ML Pen    Sig: Inject 12.5 mg into the skin once a week.    Dispense:  2 mL    Refill:  1     Hypertension Well-controlled at 121/79 with losartan  100 mg daily, hydrochlorothiazide  12.5 mg daily, and amlodipine  10 mg daily. No SE with medications. No hypotension. BP Readings from Last 3 Encounters:  07/01/24 121/79  06/01/24 112/74  05/05/24 120/76   Lab Results  Component Value Date   NA 138 06/01/2024   CL 100 06/01/2024   K 4.0 06/01/2024   CO2 21 06/01/2024   BUN 17 06/01/2024   CREATININE 1.27 (H) 06/01/2024   EGFR 50 (L) 06/01/2024   CALCIUM  10.1 06/01/2024   PHOS 2.5 07/16/2014   ALBUMIN 4.5 06/01/2024   GLUCOSE 74 06/01/2024   Continue to work on nutrition plan to promote weight loss and improve BP control.  - Continue current antihypertensive regimen.  Hyperlipidemia LDL increased to 64; goal is 55 or less for patients with type 2 diabetes. On atorvastatin  10 mg daily. Lab Results  Component Value Date   CHOL 121 06/01/2024   CHOL 109 02/20/2024   CHOL 112 12/16/2023   Lab Results  Component Value Date   HDL 38 (L) 06/01/2024   HDL 36.20 (L) 02/20/2024   HDL 37 (L) 12/16/2023   Lab Results  Component Value Date   LDLCALC 64 06/01/2024    LDLCALC 57 02/20/2024   LDLCALC 58 12/16/2023   Lab Results  Component Value Date   TRIG 104 06/01/2024   TRIG 80.0 02/20/2024   TRIG 88 12/16/2023   Lab Results  Component Value Date   CHOLHDL 3 02/20/2024   CHOLHDL 3 08/05/2023   CHOLHDL 3 02/19/2023   No results found for: LDLDIRECT Continue to work on nutrition plan -decreasing simple carbohydrates, increasing lean proteins, decreasing saturated fats and cholesterol , avoiding trans fats and exercise as able to promote weight loss, improve lipids and decrease cardiovascular risks. - Continue atorvastatin .Continue [name]  - Monitor cholesterol levels, focusing on reducing saturated fat intake.   Elevated creatinine Creatinine slightly elevated, possibly due to dehydration or Aleve  use. Electrolytes normal. - Recheck kidney function next month. - Encourage increased water intake, aiming for two 32-40 oz jugs per day= 80 oz daily - Avoid nephrotoxic medications like Aleve .  Vitamin D  Deficiency Vitamin D  is at  goal of 50.  Most recent vitamin D  level was 64.2. She is on OTC vitamin D3 2000 IU daily.x 2 capsules for total of 4000 units daily. No N/V or muscle weakness with OTC vitamin D .  Lab Results  Component Value Date   VD25OH 64.2 06/01/2024   VD25OH 74.67 02/20/2024   VD25OH 31.4 12/16/2023    Plan: Continue OTC vitamin D3 2000 IU daily x 2 capsules for total of 4000 units daily Low vitamin D  levels can be associated with adiposity and may result in leptin resistance and weight gain. Also associated with fatigue.  Currently on vitamin D  supplementation without any adverse effects such as nausea, vomiting or muscle weakness.     Sensation of feeling cold Reports feeling cold frequently; iron studies not recently checked. - Recheck iron levels next month. - Consider thyroid  function tests to rule out other causes of feeling cold.  Nausea Managed with Zofran  as needed, particularly after [name]  injections.  Occasional constipation managed with fiber supplements and dietary adjustments. - Continue/refill Zofran  as needed for nausea.  Vitals Temp: 99.4 F (37.4 C) BP: 121/79 Pulse Rate: 95 SpO2: 96 %   Anthropometric Measurements Height: 5' 8 (1.727 m) Weight: 274 lb (124.3 kg) BMI (Calculated): 41.67 Weight at Last Visit: 272 lb Weight Lost Since Last Visit: 0 Weight Gained Since Last Visit: 2 lb Starting Weight: 325 lb Total Weight Loss (lbs): 51 lb (23.1 kg) Peak Weight: 330 lb   Body Composition  Body Fat %: 52.4 % Fat Mass (lbs): 144 lbs Muscle Mass (lbs): 124.2 lbs Visceral Fat Rating : 17   Other Clinical Data Fasting: no Labs: no Today's Visit #: 111 Starting Date: 05/23/17     ASSESSMENT AND PLAN:  Diet: Brandi Lester is currently in the action stage of change. As such, her goal is to continue with weight loss efforts. She has agreed to Category 3 Plan.  Exercise: Brandi Lester has been instructed to continue exercising as is for weight loss and overall health benefits.   Behavior Modification:  We discussed the following Behavioral Modification Strategies today: increasing lean protein intake, decreasing simple carbohydrates, increasing vegetables, increase H2O intake, increase high fiber foods, meal planning and cooking strategies, avoiding temptations, and planning for success. We discussed various medication options to help Brandi Lester with her weight loss efforts and we both agreed to decrease [name]  to 12.5 mg weekly .  Return in about 4 weeks (around 07/29/2024) for Fasting Lab.SABRA She was informed of the importance of frequent follow up visits to maximize her success with intensive lifestyle modifications for her multiple health conditions.  Attestation Statements:   Reviewed by clinician on day of visit: allergies, medications, problem list, medical history, surgical history, family history, social history, and previous encounter notes.   Time spent on visit  including pre-visit chart review and post-visit care and charting was 43 minutes.    Anjuli Gemmill, PA-C

## 2024-07-02 ENCOUNTER — Other Ambulatory Visit: Payer: Self-pay

## 2024-07-02 ENCOUNTER — Other Ambulatory Visit (HOSPITAL_COMMUNITY): Payer: Self-pay

## 2024-07-02 ENCOUNTER — Other Ambulatory Visit (HOSPITAL_BASED_OUTPATIENT_CLINIC_OR_DEPARTMENT_OTHER): Payer: Self-pay

## 2024-07-02 ENCOUNTER — Telehealth (INDEPENDENT_AMBULATORY_CARE_PROVIDER_SITE_OTHER): Payer: Self-pay | Admitting: *Deleted

## 2024-07-02 NOTE — Telephone Encounter (Signed)
 [name] Pol (Key: [id number])  [employer] has not yet replied to your PA request. Depending on the information you've provided, additional questions may be returned by the plan. You may close this dialog, return to your dashboard, and perform other tasks.  To check for an update later, open this request again from your dashboard.  If [employer] has not replied to your request within 24 hours please contact [employer] at [phone].

## 2024-07-02 NOTE — Telephone Encounter (Signed)
 As long as you remain covered by the [facility] and there are no changes to your plan benefits, this request is approved for the following time period: 07/02/2024 - 07/03/2027  Approvals may be subject to dosing limits in accordance with FDA approved labeling, evidence-based practice guidelines or your pharmacy plan benefits.  Patients notified via Mychart message.

## 2024-07-09 ENCOUNTER — Other Ambulatory Visit (HOSPITAL_BASED_OUTPATIENT_CLINIC_OR_DEPARTMENT_OTHER): Payer: Self-pay

## 2024-07-09 ENCOUNTER — Other Ambulatory Visit: Payer: Self-pay | Admitting: Family Medicine

## 2024-07-09 ENCOUNTER — Other Ambulatory Visit: Payer: Self-pay

## 2024-07-09 MED ORDER — PANTOPRAZOLE SODIUM 40 MG PO TBEC
40.0000 mg | DELAYED_RELEASE_TABLET | Freq: Every day | ORAL | 1 refills | Status: AC
  Filled 2024-07-09: qty 90, 90d supply, fill #0
  Filled 2024-10-07: qty 90, 90d supply, fill #1

## 2024-07-21 ENCOUNTER — Other Ambulatory Visit: Payer: Self-pay | Admitting: Cardiology

## 2024-07-22 ENCOUNTER — Other Ambulatory Visit (HOSPITAL_BASED_OUTPATIENT_CLINIC_OR_DEPARTMENT_OTHER): Payer: Self-pay

## 2024-07-22 ENCOUNTER — Other Ambulatory Visit: Payer: Self-pay

## 2024-07-22 ENCOUNTER — Ambulatory Visit: Admitting: Obstetrics & Gynecology

## 2024-07-22 VITALS — BP 127/59 | HR 93 | Ht 70.0 in | Wt 281.0 lb

## 2024-07-22 DIAGNOSIS — Z1231 Encounter for screening mammogram for malignant neoplasm of breast: Secondary | ICD-10-CM

## 2024-07-22 DIAGNOSIS — Z01419 Encounter for gynecological examination (general) (routine) without abnormal findings: Secondary | ICD-10-CM

## 2024-07-22 DIAGNOSIS — N939 Abnormal uterine and vaginal bleeding, unspecified: Secondary | ICD-10-CM | POA: Diagnosis not present

## 2024-07-22 MED ORDER — MEDROXYPROGESTERONE ACETATE 10 MG PO TABS
10.0000 mg | ORAL_TABLET | Freq: Every day | ORAL | 4 refills | Status: AC
  Filled 2024-07-22 – 2024-07-23 (×2): qty 90, 90d supply, fill #0

## 2024-07-22 MED ORDER — CARVEDILOL 25 MG PO TABS
25.0000 mg | ORAL_TABLET | Freq: Two times a day (BID) | ORAL | 3 refills | Status: AC
  Filled 2024-07-22: qty 180, 90d supply, fill #0

## 2024-07-22 NOTE — Progress Notes (Signed)
 Patient presents for Annual.  LMP: No LMP recorded. (Menstrual status: Irregular Periods).  Last pap: Date: 06/03/2023 Contraception: mexdroxyprogestrone  Mammogram: Up to date: 10/29/2023 STD Screening: Declines Flu Vaccine : Declines  CC:   Fun Fact:

## 2024-07-22 NOTE — Progress Notes (Signed)
 GYNECOLOGY ANNUAL PREVENTATIVE CARE ENCOUNTER NOTE  History:    Brandi Lester is a 58 y.o. G0P0000 female here for a routine annual gynecologic exam.  Current complaints: none.  Desirs refill of her Provera , this helps control her fibroid associated abnormal uterine bleeding.   Denies abnormal vaginal bleeding, discharge, pelvic pain, problems with intercourse or other gynecologic concerns.  Gynecologic History Patient's last menstrual period was 06/17/2024 (exact date). Contraception: none Last Pap: 06/03/2023. Result was normal with negative HPV Last Mammogram: 10/25/2023.  Result was normal Last Colonoscopy: 9/202021.  Result was normal  Obstetric History OB History  Gravida Para Term Preterm AB Living  0 0 0 0 0 0  SAB IAB Ectopic Multiple Live Births  0 0 0 0 0    Past Medical History:  Diagnosis Date   ADD (attention deficit disorder)    Allergy     allergic rhinitis   Anemia 07/25/2013   Angio-edema    Anxiety    Arthritis    not dx'd   Back pain    Costochondritis 02/20/2015   Diabetes mellitus    Type II   previously 3 years ago - no longer on meds    Dyslipidemia 07/25/2013   Fungus infection 06/2012   Left great toe   GERD (gastroesophageal reflux disease)    Hypertension    IBS (irritable bowel syndrome)    Ingrown nail 06/2012   right foot next to the last toe   Insomnia 04/20/2013   Joint pain    Lactose intolerance    Leg edema    Obesity    Pedal edema 02/20/2015   Prediabetes    Preventative health care 09/25/2015   PVC (premature ventricular contraction)    Recurrent upper respiratory infection (URI)    Urticaria     Past Surgical History:  Procedure Laterality Date   HYSTEROSCOPY N/A 10/04/2015   Procedure: HYSTEROSCOPY with Removal IUD;  Surgeon: Marjorie Gull, MD;  Location: [facility];  Service: Gynecology;  Laterality: N/A;   IR RADIOLOGIST EVAL & MGMT  10/03/2023   lasik     eye surgery   WISDOM TOOTH EXTRACTION      Current  Outpatient Medications on File Prior to Visit  Medication Sig Dispense Refill   acetaminophen  (TYLENOL  8 HOUR) 650 MG CR tablet Take 1 tablet (650 mg total) every 8 (eight) hours as needed by mouth for pain.     amLODipine  (NORVASC ) 10 MG tablet Take 1 tablet (10 mg total) by mouth daily. 90 tablet 2   atorvastatin  (LIPITOR) 10 MG tablet Take 1 tablet (10 mg total) by mouth daily. 90 tablet 3   azelastine  (ASTELIN ) 0.1 % nasal spray Place 1 spray into both nostrils 2 (two) times daily. Use in each nostril as directed (Patient taking differently: Place 1 spray into both nostrils as needed. Use in each nostril as directed) 30 mL 12   benzonatate  (TESSALON  PERLES) 100 MG capsule Take 1-2 capsules (100-200 mg total) by mouth 3 (three) times daily as needed. 40 capsule 0   buPROPion  (WELLBUTRIN  SR) 200 MG 12 hr tablet Take 1 tablet (200 mg total) by mouth in the morning. 90 tablet 0   carvedilol  (COREG ) 25 MG tablet Take 1 tablet (25 mg total) by mouth 2 (two) times daily. 180 tablet 3   cholecalciferol (VITAMIN D3) 25 MCG (1000 UT) tablet Take 4,000 Units by mouth daily.     Fluticasone  Propionate (XHANCE ) 93 MCG/ACT EXHU Place 2 sprays into both  nostrils 2 (two) times daily for nasal congestion. (Patient taking differently: Place 2 sprays into both nostrils as needed.) 16 mL 5   ibuprofen  (ADVIL ,MOTRIN ) 600 MG tablet Take 1 tablet (600 mg total) by mouth every 6 (six) hours as needed. 90 tablet 0   losartan  (COZAAR ) 100 MG tablet Take 1 tablet (100 mg total) by mouth daily. 90 tablet 3   metFORMIN  (GLUCOPHAGE ) 500 MG tablet Take 1 tablet (500 mg total) by mouth 2 (two) times daily with a meal. 60 tablet 2   ondansetron  (ZOFRAN -ODT) 4 MG disintegrating tablet Dissolve 1 tablet (4 mg total) by mouth every 8 (eight) hours as needed for nausea or vomiting. 20 tablet 0   pantoprazole  (PROTONIX ) 40 MG tablet Take 1 tablet (40 mg total) by mouth daily. (Patient taking differently: Take 40 mg by mouth as  needed.) 90 tablet 1   potassium chloride  SA (KLOR-CON  M) 20 MEQ tablet Take 1 tablet (20 mEq total) by mouth 3 (three) times daily. 270 tablet 0   Psyllium 400 MG CAPS Take 2 capsules by mouth daily.     tirzepatide  ([name] ) 12.5 MG/0.5ML Pen Inject 12.5 mg into the skin once a week. 2 mL 1   azithromycin  (ZITHROMAX ) 250 MG tablet Take first 2 tablets together, then 1 every day until finished. (Patient not taking: Reported on 07/22/2024) 6 tablet 0   Carbinoxamine  Maleate 4 MG TABS Take 1 tablet (4 mg total) by mouth 4 (four) times daily as needed. (Patient not taking: Reported on 07/22/2024) 28 tablet 0   EPINEPHrine  (EPIPEN  2-PAK) 0.3 mg/0.3 mL IJ SOAJ injection Use as directed for severe allergic reaction (Patient not taking: Reported on 07/22/2024) 2 each 1   hydrochlorothiazide  (MICROZIDE ) 12.5 MG capsule Take 1 capsule (12.5 mg total) by mouth daily. 90 capsule 1   No current facility-administered medications on file prior to visit.    Allergies  Allergen Reactions   Penicillins Itching and Swelling    Has patient had a PCN reaction causing immediate rash, facial/tongue/throat swelling, SOB or lightheadedness with hypotension: yes  Has patient had a PCN reaction causing severe rash involving mucus membranes or skin necrosis: no  Has patient had a PCN reaction that required hospitalization no  Has patient had a PCN reaction occurring within the last 10 years: yes  If all of the above answers are NO, then may proceed with Cephalosporin use.   Victoza [Liraglutide]     Vomiting    Social History:  reports that she quit smoking about 22 years ago. Her smoking use included cigarettes. She started smoking about 37 years ago. She has a 15 pack-year smoking history. She has never been exposed to tobacco smoke. She has never used smokeless tobacco. She reports current alcohol use. She reports that she does not use drugs.  Family History  Problem Relation Age of Onset   Allergic  rhinitis Mother    Heart attack Mother 45   Hypertension Mother    Heart disease Mother    Obesity Mother    Cancer Father        prostate   Prostate cancer Father    Allergic rhinitis Sister    Heart attack Sister 26   Allergic rhinitis Sister    Allergic rhinitis Sister    Allergic rhinitis Maternal Grandmother    Hypertension Other    Colon cancer Neg Hx    Colon polyps Neg Hx    Esophageal cancer Neg Hx    Stomach cancer Neg Hx  Rectal cancer Neg Hx     The following portions of the patient's history were reviewed and updated as appropriate: allergies, current medications, past family history, past medical history, past social history, past surgical history and problem list.  Review of Systems Pertinent items noted in HPI and remainder of comprehensive ROS otherwise negative.  Physical Exam: Chaperone Shawnee Fleet, CMA  BP (!) 127/59 (BP Location: Left Arm, Patient Position: Sitting, Cuff Size: Large)   Pulse 93   Ht 5' 10 (1.778 m)   Wt 281 lb 0.6 oz (127.5 kg)   LMP 06/17/2024 (Exact Date)   BMI 40.33 kg/m  CONSTITUTIONAL: Well-developed, well-nourished female in no acute distress.  HENT:  Normocephalic, atraumatic, External right and left ear normal.  EYES: Conjunctivae and EOM are normal. Pupils are equal, round, and reactive to light. No scleral icterus.  NECK: Normal range of motion, supple, no masses observed. SKIN: Skin is warm and dry. No rash noted. Not diaphoretic. No erythema. No pallor. MUSCULOSKELETAL: Normal range of motion. No tenderness.  No cyanosis, clubbing, or edema. NEUROLOGIC: Alert and oriented to person, place, and time. Normal muscle tone coordination.  PSYCHIATRIC: Normal mood and affect. Normal behavior. Normal judgment and thought content. CARDIOVASCULAR: Normal heart rate noted, regular rhythm RESPIRATORY: Clear to auscultation bilaterally. Effort and breath sounds normal, no problems with respiration noted. BREASTS: Symmetric in size.  No masses, tenderness, skin changes, nipple drainage, or lymphadenopathy bilaterally. Performed in the presence of a chaperone. ABDOMEN: Soft, no distention noted.  No tenderness, rebound or guarding.  Fibroid uterus palpated PELVIC: Deferred  Assessment and Plan:    1. Abnormal uterine bleeding (AUB) Associated with her fibroids.  She does not want any surgical intervention for now, may consider hysterectomy later. Bleeding precautions reviewed, Provera  refilled. - medroxyPROGESTERone  (PROVERA ) 10 MG tablet; Take 1 tablet (10 mg total) by mouth daily.  Dispense: 90 tablet; Refill: 4  2. Breast cancer screening by mammogram Mammogram scheduled for breast cancer screening. - MM 3D SCREENING MAMMOGRAM BILATERAL BREAST; Future  3. Encounter for annual routine gynecological examination (Primary) Pap smear is up to date. Colon cancer screening is up to date. Routine preventative health maintenance measures emphasized; follow up with other specialists for other issues. Please refer to After Visit Summary for other counseling recommendations.      GLORIS HUGGER, MD, FACOG Obstetrician & Gynecologist, [facility] for [employer], [facility]

## 2024-07-23 ENCOUNTER — Other Ambulatory Visit (HOSPITAL_BASED_OUTPATIENT_CLINIC_OR_DEPARTMENT_OTHER): Payer: Self-pay

## 2024-07-28 NOTE — Progress Notes (Signed)
 SUBJECTIVE: Discussed the use of AI scribe software for clinical note transcription with the patient, who gave verbal consent to proceed.  Chief Complaint: Obesity  Interim History: She is down 2 lbs since her last visit.  Down 53 lbs overall TBW loss of 16.3%  Dalylah is here to discuss her progress with her obesity treatment plan. She is on the Category 4 Plan and states she is following her eating plan approximately 75 % of the time. She states she is exercising cardio/weights/treadmill/elliptical 30-45  minutes 4 times per week. Varina Wynona Duhamel is a 58 year old female with obesity, type 2 diabetes, hypertension, and hyperlipidemia who presents for follow-up of her obesity treatment plan.  She has been on [name]  12.5 mg weekly for type 2 diabetes, which has improved her A1c and insulin  levels. She missed her injection last week due to a tooth extraction and was prescribed prednisone  for a week. Despite this, she has lost two pounds, with an increase in muscle mass and a decrease in adipose mass from 144 to 134. She is able to eat soft foods post-extraction and resumed workouts after two days of rest.  She is currently on losartan  100 mg daily, hydrochlorothiazide  12.5 mg daily, and amlodipine  10 mg daily for hypertension. She takes atorvastatin  10 mg daily for hyperlipidemia and over-the-counter vitamin D3 4000 units daily. There was a slight increase in her creatinine levels, which is being monitored. She experiences feelings of being excessively cold, prompting a recheck of CBC, B12, and anemia panel.  She reports being mindful of her nutrition during holidays. She finds it challenging to maintain her activity level during winter due to early darkness but manages to work out before it gets dark.  She has a history of obstructive sleep apnea and vitamin D  deficiency. Her vitamin D  level was previously low but has increased to 64.2. She also takes metformin  twice daily, which was  increased due to a rise in insulin  levels.   Non fasting follow up labs obtained today.  The patient was informed we would discuss the lab results at the next visit unless there is a critical issue that needs to be addressed sooner. The patient agreed to keep the next visit at the agreed upon time to discuss these results.   OBJECTIVE: Visit Diagnoses: Problem List Items Addressed This Visit     Hyperlipidemia associated with type 2 diabetes mellitus (HCC)   Relevant Medications   tirzepatide  ([name] ) 12.5 MG/0.5ML Pen   metFORMIN  (GLUCOPHAGE ) 500 MG tablet   Vitamin D  deficiency   Relevant Orders   VITAMIN D  25 Hydroxy (Vit-D Deficiency, Fractures)   Depression   Relevant Medications   buPROPion  (WELLBUTRIN  SR) 200 MG 12 hr tablet   Hypertension associated with type 2 diabetes mellitus (HCC)   Relevant Medications   tirzepatide  ([name] ) 12.5 MG/0.5ML Pen   metFORMIN  (GLUCOPHAGE ) 500 MG tablet   Diabetes mellitus (HCC) - Primary   Relevant Medications   tirzepatide  ([name] ) 12.5 MG/0.5ML Pen   metFORMIN  (GLUCOPHAGE ) 500 MG tablet   Other Relevant Orders   CMP14+EGFR   BMI 40.0-44.9, adult (HCC) Current BMI 40.1   Relevant Medications   tirzepatide  ([name] ) 12.5 MG/0.5ML Pen   metFORMIN  (GLUCOPHAGE ) 500 MG tablet   Obesity (HCC)- Start BMI 47.99   Relevant Medications   tirzepatide  ([name] ) 12.5 MG/0.5ML Pen   metFORMIN  (GLUCOPHAGE ) 500 MG tablet   Other Visit Diagnoses       Sensation of feeling cold  Relevant Orders   Vitamin B12   CBC with Differential/Platelet   Folate   Iron and TIBC   Ferritin     Elevated serum creatinine         Obesity Management is ongoing with significant weight loss of 53 pounds and a BMI of 41. Body composition shows a decrease in adipose mass from 144 to 134. Visceral fat rating decreased to 16, with a goal of 12 or less. She is following a dietary plan 75% of the time and has adjusted [name]  dosage to stimulate  appetite. Challenges with maintaining activity levels in winter months were discussed. - Continue [name]  12.5 mg weekly. - Encouraged adherence to dietary plan, especially during holidays. - Encouraged maintaining physical activity despite winter challenges.  Type 2 diabetes mellitus Type 2 diabetes is managed with [name]  and metformin . A1c and insulin  levels have improved. Metformin  dosage was increased to twice daily due to rising insulin  levels. - Continue [name]  12.5 mg weekly. - Continue metformin  twice daily. - Refilled metformin  prescription.  Hypertension Managed with losartan , hydrochlorothiazide , and amlodipine . - Continue losartan  100 mg daily. - Continue hydrochlorothiazide  12.5 mg daily. - Continue amlodipine  10 mg daily.  Hyperlipidemia Managed with atorvastatin . - Continue atorvastatin  10 mg daily.  Vitamin D  deficiency Vitamin D  levels have increased to 64.2 from 31. She is on 4000 units of vitamin D3 daily. Discussed potential risks of high vitamin D  levels, including muscle soreness and nausea. - Rechecked vitamin D  levels. - Continue vitamin D3 4000 units daily.  Renal function monitoring Slight increase in creatinine noted. Monitoring is necessary due to recent prednisone  use and potential dehydration. - Rechecked creatinine levels. - Ensure adequate hydration.  Evaluation for anemia Reports of feeling excessively cold. Previous vitamin D  levels were low, but current levels are adequate. Anemia panel is warranted to rule out anemia as a cause of symptoms. - Ordered CBC, B12, and iron studies.  Vitals Temp: 98.4 F (36.9 C) BP: 115/73 Pulse Rate: 94 SpO2: 97 %   Anthropometric Measurements Height: 5' 8 (1.727 m) Weight: 272 lb (123.4 kg) BMI (Calculated): 41.37 Weight at Last Visit: 274 lb Weight Lost Since Last Visit: 2 lb Weight Gained Since Last Visit: 0 Starting Weight: 325 lb Total Weight Loss (lbs): 53 lb (24 kg) Peak Weight: 330  lb   Body Composition  Body Fat %: 49.2 % Fat Mass (lbs): 134 lbs Muscle Mass (lbs): 131.4 lbs Total Body Water (lbs): 95 lbs Visceral Fat Rating : 16   Other Clinical Data Fasting: Yes Labs: Yes Today's Visit #: 112 Starting Date: 05/23/17     ASSESSMENT AND PLAN:  Diet: Alaa is currently in the action stage of change. As such, her goal is to continue with weight loss efforts. She has agreed to Category 4 Plan.  Exercise: Avannah has been instructed to continue exercising as is for weight loss and overall health benefits.   Behavior Modification:  We discussed the following Behavioral Modification Strategies today: increasing lean protein intake, decreasing simple carbohydrates, increasing vegetables, increase H2O intake, increase high fiber foods, no skipping meals, meal planning and cooking strategies, emotional eating strategies , holiday eating strategies, avoiding temptations, and planning for success. We discussed various medication options to help Messiah with her weight loss efforts and we both agreed to continue [name]  12.5 mg weekly and metformin  500 mg BID for T2DM .  Return in about 5 weeks (around 09/02/2024).SABRA She was informed of the importance of frequent follow up visits to maximize  her success with intensive lifestyle modifications for her multiple health conditions.  Attestation Statements:   Reviewed by clinician on day of visit: allergies, medications, problem list, medical history, surgical history, family history, social history, and previous encounter notes.   Time spent on visit including pre-visit chart review and post-visit care and charting was 24 minutes.    Shawnetta Lein, PA-C

## 2024-07-29 ENCOUNTER — Encounter (INDEPENDENT_AMBULATORY_CARE_PROVIDER_SITE_OTHER): Payer: Self-pay | Admitting: Physician Assistant

## 2024-07-29 ENCOUNTER — Other Ambulatory Visit (HOSPITAL_BASED_OUTPATIENT_CLINIC_OR_DEPARTMENT_OTHER): Payer: Self-pay

## 2024-07-29 ENCOUNTER — Ambulatory Visit (INDEPENDENT_AMBULATORY_CARE_PROVIDER_SITE_OTHER): Admitting: Physician Assistant

## 2024-07-29 VITALS — BP 115/73 | HR 94 | Temp 98.4°F | Ht 68.0 in | Wt 272.0 lb

## 2024-07-29 DIAGNOSIS — R7989 Other specified abnormal findings of blood chemistry: Secondary | ICD-10-CM | POA: Diagnosis not present

## 2024-07-29 DIAGNOSIS — E1169 Type 2 diabetes mellitus with other specified complication: Secondary | ICD-10-CM | POA: Diagnosis not present

## 2024-07-29 DIAGNOSIS — E559 Vitamin D deficiency, unspecified: Secondary | ICD-10-CM

## 2024-07-29 DIAGNOSIS — I152 Hypertension secondary to endocrine disorders: Secondary | ICD-10-CM

## 2024-07-29 DIAGNOSIS — E785 Hyperlipidemia, unspecified: Secondary | ICD-10-CM

## 2024-07-29 DIAGNOSIS — F3289 Other specified depressive episodes: Secondary | ICD-10-CM

## 2024-07-29 DIAGNOSIS — E1159 Type 2 diabetes mellitus with other circulatory complications: Secondary | ICD-10-CM

## 2024-07-29 DIAGNOSIS — Z6841 Body Mass Index (BMI) 40.0 and over, adult: Secondary | ICD-10-CM

## 2024-07-29 DIAGNOSIS — Z7985 Long-term (current) use of injectable non-insulin antidiabetic drugs: Secondary | ICD-10-CM

## 2024-07-29 DIAGNOSIS — Z7984 Long term (current) use of oral hypoglycemic drugs: Secondary | ICD-10-CM

## 2024-07-29 DIAGNOSIS — R6889 Other general symptoms and signs: Secondary | ICD-10-CM

## 2024-07-29 MED ORDER — BUPROPION HCL ER (SR) 200 MG PO TB12
200.0000 mg | ORAL_TABLET | Freq: Every morning | ORAL | 0 refills | Status: DC
  Filled 2024-07-29 – 2024-09-01 (×2): qty 90, 90d supply, fill #0

## 2024-07-29 MED ORDER — TIRZEPATIDE 12.5 MG/0.5ML ~~LOC~~ SOAJ
12.5000 mg | SUBCUTANEOUS | 1 refills | Status: DC
  Filled 2024-07-29: qty 2, 28d supply, fill #0

## 2024-07-29 MED ORDER — METFORMIN HCL 500 MG PO TABS
500.0000 mg | ORAL_TABLET | Freq: Two times a day (BID) | ORAL | 2 refills | Status: AC
  Filled 2024-07-29 – 2024-08-12 (×2): qty 60, 30d supply, fill #0
  Filled 2024-09-18: qty 60, 30d supply, fill #1

## 2024-07-30 LAB — CBC WITH DIFFERENTIAL/PLATELET
Basophils Absolute: 0 x10E3/uL (ref 0.0–0.2)
Basos: 1 %
EOS (ABSOLUTE): 0.1 x10E3/uL (ref 0.0–0.4)
Eos: 2 %
Hematocrit: 35.9 % (ref 34.0–46.6)
Hemoglobin: 12 g/dL (ref 11.1–15.9)
Immature Grans (Abs): 0 x10E3/uL (ref 0.0–0.1)
Immature Granulocytes: 0 %
Lymphocytes Absolute: 1.3 x10E3/uL (ref 0.7–3.1)
Lymphs: 23 %
MCH: 29 pg (ref 26.6–33.0)
MCHC: 33.4 g/dL (ref 31.5–35.7)
MCV: 87 fL (ref 79–97)
Monocytes Absolute: 0.4 x10E3/uL (ref 0.1–0.9)
Monocytes: 8 %
Neutrophils Absolute: 3.8 x10E3/uL (ref 1.4–7.0)
Neutrophils: 66 %
Platelets: 278 x10E3/uL (ref 150–450)
RBC: 4.14 x10E6/uL (ref 3.77–5.28)
RDW: 13.4 % (ref 11.7–15.4)
WBC: 5.8 x10E3/uL (ref 3.4–10.8)

## 2024-07-30 LAB — FERRITIN: Ferritin: 22 ng/mL (ref 15–150)

## 2024-07-30 LAB — CMP14+EGFR
ALT: 14 IU/L (ref 0–32)
AST: 13 IU/L (ref 0–40)
Albumin: 4.1 g/dL (ref 3.8–4.9)
Alkaline Phosphatase: 84 IU/L (ref 49–135)
BUN/Creatinine Ratio: 8 — ABNORMAL LOW (ref 9–23)
BUN: 6 mg/dL (ref 6–24)
Bilirubin Total: 0.3 mg/dL (ref 0.0–1.2)
CO2: 23 mmol/L (ref 20–29)
Calcium: 9.2 mg/dL (ref 8.7–10.2)
Chloride: 103 mmol/L (ref 96–106)
Creatinine, Ser: 0.78 mg/dL (ref 0.57–1.00)
Globulin, Total: 2.7 g/dL (ref 1.5–4.5)
Glucose: 90 mg/dL (ref 70–99)
Potassium: 3.8 mmol/L (ref 3.5–5.2)
Sodium: 140 mmol/L (ref 134–144)
Total Protein: 6.8 g/dL (ref 6.0–8.5)
eGFR: 89 mL/min/1.73 (ref >59)

## 2024-07-30 LAB — VITAMIN B12: Vitamin B-12: 1249 pg/mL — ABNORMAL HIGH (ref 232–1245)

## 2024-07-30 LAB — IRON AND TIBC
Iron Saturation: 17 % (ref 15–55)
Iron: 58 ug/dL (ref 27–159)
Total Iron Binding Capacity: 344 ug/dL (ref 250–450)
UIBC: 286 ug/dL (ref 131–425)

## 2024-07-30 LAB — FOLATE: Folate: 4.9 ng/mL (ref >3.0)

## 2024-07-30 LAB — VITAMIN D 25 HYDROXY (VIT D DEFICIENCY, FRACTURES): Vit D, 25-Hydroxy: 69.5 ng/mL (ref 30.0–100.0)

## 2024-07-31 ENCOUNTER — Other Ambulatory Visit (HOSPITAL_BASED_OUTPATIENT_CLINIC_OR_DEPARTMENT_OTHER): Payer: Self-pay

## 2024-07-31 ENCOUNTER — Ambulatory Visit (INDEPENDENT_AMBULATORY_CARE_PROVIDER_SITE_OTHER): Payer: Self-pay | Admitting: Physician Assistant

## 2024-08-01 ENCOUNTER — Other Ambulatory Visit (HOSPITAL_BASED_OUTPATIENT_CLINIC_OR_DEPARTMENT_OTHER): Payer: Self-pay

## 2024-08-03 ENCOUNTER — Other Ambulatory Visit (HOSPITAL_BASED_OUTPATIENT_CLINIC_OR_DEPARTMENT_OTHER): Payer: Self-pay

## 2024-08-04 ENCOUNTER — Other Ambulatory Visit (HOSPITAL_BASED_OUTPATIENT_CLINIC_OR_DEPARTMENT_OTHER): Payer: Self-pay

## 2024-08-06 ENCOUNTER — Other Ambulatory Visit (HOSPITAL_BASED_OUTPATIENT_CLINIC_OR_DEPARTMENT_OTHER): Payer: Self-pay

## 2024-08-07 ENCOUNTER — Other Ambulatory Visit (HOSPITAL_BASED_OUTPATIENT_CLINIC_OR_DEPARTMENT_OTHER): Payer: Self-pay

## 2024-08-10 ENCOUNTER — Other Ambulatory Visit: Payer: Self-pay

## 2024-08-10 ENCOUNTER — Other Ambulatory Visit (HOSPITAL_BASED_OUTPATIENT_CLINIC_OR_DEPARTMENT_OTHER): Payer: Self-pay

## 2024-08-12 ENCOUNTER — Other Ambulatory Visit: Payer: Self-pay | Admitting: Cardiology

## 2024-08-12 ENCOUNTER — Other Ambulatory Visit (HOSPITAL_BASED_OUTPATIENT_CLINIC_OR_DEPARTMENT_OTHER): Payer: Self-pay

## 2024-08-12 DIAGNOSIS — I152 Hypertension secondary to endocrine disorders: Secondary | ICD-10-CM

## 2024-08-14 ENCOUNTER — Other Ambulatory Visit (HOSPITAL_BASED_OUTPATIENT_CLINIC_OR_DEPARTMENT_OTHER): Payer: Self-pay

## 2024-08-14 MED ORDER — LOSARTAN POTASSIUM 100 MG PO TABS
100.0000 mg | ORAL_TABLET | Freq: Every day | ORAL | 0 refills | Status: AC
  Filled 2024-08-14: qty 90, 90d supply, fill #0

## 2024-08-20 ENCOUNTER — Telehealth: Payer: Self-pay | Admitting: *Deleted

## 2024-08-20 NOTE — Telephone Encounter (Signed)
 Received CMN for CPAP supplies.  Placed in CMN folder for signature.

## 2024-08-21 ENCOUNTER — Telehealth: Payer: Self-pay

## 2024-08-21 NOTE — Telephone Encounter (Signed)
 Faxed signed CMN to [facility] at [phone]. Received fax confirmation, NFN

## 2024-08-23 NOTE — Assessment & Plan Note (Signed)
 Well controlled, no changes to meds. Encouraged heart healthy diet such as the DASH diet and exercise as tolerated.

## 2024-08-23 NOTE — Assessment & Plan Note (Signed)
 Encourage heart healthy diet such as MIND or DASH diet, increase exercise, avoid trans fats, simple carbohydrates and processed foods, consider a krill or fish or flaxseed oil cap daily. Tolerating Atorvastatin

## 2024-08-23 NOTE — Assessment & Plan Note (Signed)
 [id number] acceptable, minimize simple carbs. Increase exercise as tolerated. Continue current meds including [name] .

## 2024-08-23 NOTE — Progress Notes (Unsigned)
 "  Subjective:    Patient ID: Brandi Lester, female    DOB: [birth year], 58 y.o.   MRN: [id number]  No chief complaint on file.   HPI Discussed the use of AI scribe software for clinical note transcription with the patient, who gave verbal consent to proceed.  History of Present Illness Brandi Lester is a 58 year old female who presents with facial pressure and cough.  She has been experiencing bilateral facial pressure and pain for the past two weeks. The pain is accompanied by a productive cough with clear sputum, although it initially presents as dark yellow. The cough is severe enough to disrupt her sleep, preventing her from using her CPAP mask until the previous night, when she managed to sleep for twelve hours with it on.  She reports significant nasal congestion and sneezing. She has been using nasal saline, Astelin , and Nasacort  to manage her symptoms, although the congestion sometimes prevents the medication from being effective. She also experienced diarrhea, which has since resolved after taking Imodium.  Her current medications include Mucinex, Astelin , and Nasacort . She uses nasal saline to help with congestion. She has allergies to penicillin and Victoza.  In her social history, she is a land. She has recently joined a gym to aid in weight loss and has lost weight, going from 281 to 273 pounds. She is mindful of her hydration, especially given her recent symptoms, and is trying to increase her water intake.    Past Medical History:  Diagnosis Date   ADD (attention deficit disorder)    Allergy     allergic rhinitis   Anemia 07/25/2013   Angio-edema    Anxiety    Arthritis    not dx'd   Back pain    Costochondritis 02/20/2015   Diabetes mellitus    Type II   previously 3 years ago - no longer on meds    Dyslipidemia 07/25/2013   Fungus infection 06/2012   Left great toe   GERD (gastroesophageal reflux disease)    Hypertension     IBS (irritable bowel syndrome)    Ingrown nail 06/2012   right foot next to the last toe   Insomnia 04/20/2013   Joint pain    Lactose intolerance    Leg edema    Obesity    Pedal edema 02/20/2015   Prediabetes    Preventative health care 09/25/2015   PVC (premature ventricular contraction)    Recurrent upper respiratory infection (URI)    Urticaria     Past Surgical History:  Procedure Laterality Date   HYSTEROSCOPY N/A 10/04/2015   Procedure: HYSTEROSCOPY with Removal IUD;  Surgeon: Marjorie Gull, MD;  Location: [facility];  Service: Gynecology;  Laterality: N/A;   IR RADIOLOGIST EVAL & MGMT  10/03/2023   lasik     eye surgery   WISDOM TOOTH EXTRACTION      Family History  Problem Relation Age of Onset   Allergic rhinitis Mother    Heart attack Mother 50   Hypertension Mother    Heart disease Mother    Obesity Mother    Cancer Father        prostate   Prostate cancer Father    Allergic rhinitis Sister    Heart attack Sister 37   Allergic rhinitis Sister    Allergic rhinitis Sister    Allergic rhinitis Maternal Grandmother    Hypertension Other    Colon cancer Neg Hx    Colon polyps Neg  Hx    Esophageal cancer Neg Hx    Stomach cancer Neg Hx    Rectal cancer Neg Hx     Social History   Socioeconomic History   Marital status: Single    Spouse name: Not on file   Number of children: Not on file   Years of education: Not on file   Highest education level: Not on file  Occupational History   Occupation: Runner, Broadcasting/film/video, public librarian  Tobacco Use   Smoking status: Former    Current packs/day: 0.00    Average packs/day: 1 pack/day for 15.0 years (15.0 ttl pk-yrs)    Types: Cigarettes    Start date: 09/03/1986    Quit date: 09/03/2001    Years since quitting: 22.9    Passive exposure: Never   Smokeless tobacco: Never  Vaping Use   Vaping status: Never Used  Substance and Sexual Activity   Alcohol use: Yes    Comment: rare   Drug use: No   Sexual activity: Yes     Birth control/protection: None  Other Topics Concern   Not on file  Social History Narrative   Not on file   Social Drivers of Health   Tobacco Use: Medium Risk (07/29/2024)   Patient History    Smoking Tobacco Use: Former    Smokeless Tobacco Use: Never    Passive Exposure: Never  Programmer, Applications: Not on [employer] Insecurity: Not on file  Transportation Needs: Not on file  Physical Activity: Not on file  Stress: Not on file  Social Connections: Not on file  Intimate Partner Violence: Not on file  Depression (PHQ2-9): Low Risk (07/22/2024)   Depression (PHQ2-9)    PHQ-2 Score: 1  Alcohol Screen: Not on file  Housing: Not on file  Utilities: Not on file  Health Literacy: Not on file    Outpatient Medications Prior to Visit  Medication Sig Dispense Refill   acetaminophen  (TYLENOL  8 HOUR) 650 MG CR tablet Take 1 tablet (650 mg total) every 8 (eight) hours as needed by mouth for pain.     amLODipine  (NORVASC ) 10 MG tablet Take 1 tablet (10 mg total) by mouth daily. 90 tablet 2   atorvastatin  (LIPITOR) 10 MG tablet Take 1 tablet (10 mg total) by mouth daily. 90 tablet 3   azelastine  (ASTELIN ) 0.1 % nasal spray Place 1 spray into both nostrils 2 (two) times daily. Use in each nostril as directed (Patient taking differently: Place 1 spray into both nostrils as needed. Use in each nostril as directed) 30 mL 12   benzonatate  (TESSALON  PERLES) 100 MG capsule Take 1-2 capsules (100-200 mg total) by mouth 3 (three) times daily as needed. 40 capsule 0   buPROPion  (WELLBUTRIN  SR) 200 MG 12 hr tablet Take 1 tablet (200 mg total) by mouth in the morning. 90 tablet 0   Carbinoxamine  Maleate 4 MG TABS Take 1 tablet (4 mg total) by mouth 4 (four) times daily as needed. (Patient not taking: Reported on 07/29/2024) 28 tablet 0   carvedilol  (COREG ) 25 MG tablet Take 1 tablet (25 mg total) by mouth 2 (two) times daily. 180 tablet 3   cholecalciferol (VITAMIN D3) 25 MCG (1000 UT) tablet  Take 4,000 Units by mouth daily.     EPINEPHrine  (EPIPEN  2-PAK) 0.3 mg/0.3 mL IJ SOAJ injection Use as directed for severe allergic reaction 2 each 1   Fluticasone  Propionate (XHANCE ) 93 MCG/ACT EXHU Place 2 sprays into both nostrils 2 (two) times daily for  nasal congestion. (Patient taking differently: Place 2 sprays into both nostrils as needed.) 16 mL 5   hydrochlorothiazide  (MICROZIDE ) 12.5 MG capsule Take 1 capsule (12.5 mg total) by mouth daily. 90 capsule 1   ibuprofen  (ADVIL ,MOTRIN ) 600 MG tablet Take 1 tablet (600 mg total) by mouth every 6 (six) hours as needed. 90 tablet 0   losartan  (COZAAR ) 100 MG tablet Take 1 tablet (100 mg total) by mouth daily. 90 tablet 0   medroxyPROGESTERone  (PROVERA ) 10 MG tablet Take 1 tablet (10 mg total) by mouth daily. 90 tablet 4   metFORMIN  (GLUCOPHAGE ) 500 MG tablet Take 1 tablet (500 mg total) by mouth 2 (two) times daily with a meal. 60 tablet 2   ondansetron  (ZOFRAN -ODT) 4 MG disintegrating tablet Dissolve 1 tablet (4 mg total) by mouth every 8 (eight) hours as needed for nausea or vomiting. 20 tablet 0   pantoprazole  (PROTONIX ) 40 MG tablet Take 1 tablet (40 mg total) by mouth daily. (Patient taking differently: Take 40 mg by mouth as needed.) 90 tablet 1   potassium chloride  SA (KLOR-CON  M) 20 MEQ tablet Take 1 tablet (20 mEq total) by mouth 3 (three) times daily. 270 tablet 0   Psyllium 400 MG CAPS Take 2 capsules by mouth daily.     tirzepatide  ([name] ) 12.5 MG/0.5ML Pen Inject 12.5 mg into the skin once a week. 2 mL 1   No facility-administered medications prior to visit.    Allergies[1]  Review of Systems  Constitutional:  Positive for malaise/fatigue. Negative for fever.  HENT:  Positive for congestion and sinus pain. Negative for sore throat.   Eyes:  Negative for blurred vision.  Respiratory:  Positive for cough and sputum production. Negative for shortness of breath.   Cardiovascular:  Negative for chest pain, palpitations and leg  swelling.  Gastrointestinal:  Negative for abdominal pain, blood in stool and nausea.  Genitourinary:  Negative for dysuria and frequency.  Musculoskeletal:  Negative for falls.  Skin:  Negative for rash.  Neurological:  Negative for dizziness, loss of consciousness and headaches.  Endo/Heme/Allergies:  Negative for environmental allergies.  Psychiatric/Behavioral:  Negative for depression. The patient is not nervous/anxious.        Objective:    Physical Exam Constitutional:      General: She is not in acute distress.    Appearance: Normal appearance. She is well-developed. She is not toxic-appearing.  HENT:     Head: Normocephalic and atraumatic.     Right Ear: External ear normal.     Left Ear: External ear normal.     Nose: Nose normal.  Eyes:     General:        Right eye: No discharge.        Left eye: No discharge.     Conjunctiva/sclera: Conjunctivae normal.  Neck:     Thyroid : No thyromegaly.  Cardiovascular:     Rate and Rhythm: Normal rate and regular rhythm.     Heart sounds: Normal heart sounds. No murmur heard. Pulmonary:     Effort: Pulmonary effort is normal. No respiratory distress.     Breath sounds: Normal breath sounds.  Abdominal:     General: Bowel sounds are normal.     Palpations: Abdomen is soft.     Tenderness: There is no abdominal tenderness. There is no guarding.  Musculoskeletal:        General: Normal range of motion.     Cervical back: Neck supple.  Lymphadenopathy:  Cervical: No cervical adenopathy.  Skin:    General: Skin is warm and dry.  Neurological:     Mental Status: She is alert and oriented to person, place, and time.  Psychiatric:        Mood and Affect: Mood normal.        Behavior: Behavior normal.        Thought Content: Thought content normal.        Judgment: Judgment normal.    There were no vitals taken for this visit. Wt Readings from Last 3 Encounters:  07/29/24 272 lb (123.4 kg)  07/22/24 281 lb 0.6 oz  (127.5 kg)  07/01/24 274 lb (124.3 kg)    Diabetic Foot Exam - Simple   No data filed    Lab Results  Component Value Date   WBC 5.8 07/29/2024   HGB 12.0 07/29/2024   HCT 35.9 07/29/2024   PLT 278 07/29/2024   GLUCOSE 90 07/29/2024   CHOL 121 06/01/2024   TRIG 104 06/01/2024   HDL 38 (L) 06/01/2024   LDLCALC 64 06/01/2024   ALT 14 07/29/2024   AST 13 07/29/2024   NA 140 07/29/2024   K 3.8 07/29/2024   CL 103 07/29/2024   CREATININE 0.78 07/29/2024   BUN 6 07/29/2024   CO2 23 07/29/2024   TSH 1.00 02/20/2024   HGBA1C 5.5 06/01/2024   MICROALBUR 4.0 (H) 02/20/2024    Lab Results  Component Value Date   TSH 1.00 02/20/2024   Lab Results  Component Value Date   WBC 5.8 07/29/2024   HGB 12.0 07/29/2024   HCT 35.9 07/29/2024   MCV 87 07/29/2024   PLT 278 07/29/2024   Lab Results  Component Value Date   NA 140 07/29/2024   K 3.8 07/29/2024   CO2 23 07/29/2024   GLUCOSE 90 07/29/2024   BUN 6 07/29/2024   CREATININE 0.78 07/29/2024   BILITOT 0.3 07/29/2024   ALKPHOS 84 07/29/2024   AST 13 07/29/2024   ALT 14 07/29/2024   PROT 6.8 07/29/2024   ALBUMIN 4.1 07/29/2024   CALCIUM  9.2 07/29/2024   EGFR 89 07/29/2024   GFR 76.94 02/20/2024   Lab Results  Component Value Date   CHOL 121 06/01/2024   Lab Results  Component Value Date   HDL 38 (L) 06/01/2024   Lab Results  Component Value Date   LDLCALC 64 06/01/2024   Lab Results  Component Value Date   TRIG 104 06/01/2024   Lab Results  Component Value Date   CHOLHDL 3 02/20/2024   Lab Results  Component Value Date   HGBA1C 5.5 06/01/2024       Assessment & Plan:  Type 2 diabetes mellitus in patient with obesity (HCC) Assessment & Plan: hgba1c acceptable, minimize simple carbs. Increase exercise as tolerated. Continue current meds including [name] .    Vitamin D  deficiency Assessment & Plan: Supplement and monitor    Obesity (HCC)- Start BMI 47.99 Assessment & Plan: Encouraged DASH  or MIND diet, decrease po intake and increase exercise as tolerated. Needs 7-8 hours of sleep nightly. Avoid trans fats, eat small, frequent meals every 4-5 hours with lean proteins, complex carbs and healthy fats. Minimize simple carbs, high fat foods and processed foods    Hypertension associated with type 2 diabetes mellitus (HCC) Assessment & Plan: Well controlled, no changes to meds. Encouraged heart healthy diet such as the DASH diet and exercise as tolerated.     Hyperlipidemia associated with type 2 diabetes mellitus (  HCC) Assessment & Plan: Encourage heart healthy diet such as MIND or DASH diet, increase exercise, avoid trans fats, simple carbohydrates and processed foods, consider a krill or fish or flaxseed oil cap daily. Tolerating Atorvastatin    High serum vitamin B12 Assessment & Plan: Will monitor     Assessment and Plan Assessment & Plan Acute sinusitis Symptoms persisting for two weeks, including facial pressure, bilateral pain, postnasal drip, productive cough with clear sputum, and congestion. Symptoms have impacted CPAP use. Decision to treat with antibiotics due to prolonged symptoms and impact on quality of life. - Prescribed doxycycline  twice daily for sinusitis. - Continue Mucinex to thin mucus. - Use nasal saline before Nasacort  to improve absorption. - Prescribed Nasacort , 2 sprays each nostril daily as needed. - Continue Astelin  daily, especially during cold and flu season. - Hydrate adequately to support immune response. - Consider low-intensity exercise after starting antibiotics.  Type 2 diabetes mellitus Blood work not due until end of January. Weight decreased from 281 to 273 pounds, indicating positive progress. - Continue current diabetes management plan. - Will schedule blood work at the end of January.  Morbid obesity Weight decreased from 281 to 273 pounds. Engaged in exercise regimen with focus on weight training followed by cardio, which  has been effective for weight loss. - Continue current exercise regimen with focus on weight training followed by cardio. - Encouraged consistent hydration and healthy diet.  Hypertension Blood pressure well-controlled at 122/76 mmHg. - Continue current antihypertensive regimen.  Obstructive sleep apnea Recent congestion has impacted CPAP use, but symptoms have improved with recent use of CPAP. - Continue CPAP use as tolerated.  General health maintenance Discussed eligibility for Prevnar 20 pneumonia vaccine. Annual flu vaccines up to date. Discussed benefits of Astelin  in reducing COVID risk. - Will administer Prevnar 20 vaccine in January at pharmacy. - Continue annual COVID and flu vaccinations. - Use Astelin  daily during cold and flu season.  Recording duration: 23 minutes     Harlene Horton, MD    [1]  Allergies Allergen Reactions   Penicillins Itching and Swelling    Has patient had a PCN reaction causing immediate rash, facial/tongue/throat swelling, SOB or lightheadedness with hypotension: yes  Has patient had a PCN reaction causing severe rash involving mucus membranes or skin necrosis: no  Has patient had a PCN reaction that required hospitalization no  Has patient had a PCN reaction occurring within the last 10 years: yes  If all of the above answers are NO, then may proceed with Cephalosporin use.   Victoza [Liraglutide]     Vomiting   "

## 2024-08-23 NOTE — Assessment & Plan Note (Signed)
 Her BMI today is 46. Encouraged ongoing weight-loss efforts, as even modest reductions can significantly improve overall health. Encouraged DASH or MIND diet, decrease po intake and increase exercise as tolerated. Needs 7-8 hours of sleep nightly. Avoid trans fats, eat small, frequent meals every 4-5 hours with lean proteins, complex carbs and healthy fats. Minimize simple carbs, high fat foods and processed foods

## 2024-08-23 NOTE — Assessment & Plan Note (Signed)
 Supplement and monitor

## 2024-08-23 NOTE — Assessment & Plan Note (Signed)
 Will monitor

## 2024-08-24 ENCOUNTER — Other Ambulatory Visit (HOSPITAL_BASED_OUTPATIENT_CLINIC_OR_DEPARTMENT_OTHER): Payer: Self-pay

## 2024-08-24 ENCOUNTER — Ambulatory Visit: Admitting: Family Medicine

## 2024-08-24 ENCOUNTER — Encounter: Payer: Self-pay | Admitting: Family Medicine

## 2024-08-24 VITALS — BP 122/76 | HR 88 | Temp 98.2°F | Resp 16 | Ht 68.0 in | Wt 273.4 lb

## 2024-08-24 DIAGNOSIS — I152 Hypertension secondary to endocrine disorders: Secondary | ICD-10-CM

## 2024-08-24 DIAGNOSIS — R051 Acute cough: Secondary | ICD-10-CM

## 2024-08-24 DIAGNOSIS — E1169 Type 2 diabetes mellitus with other specified complication: Secondary | ICD-10-CM | POA: Diagnosis not present

## 2024-08-24 DIAGNOSIS — E669 Obesity, unspecified: Secondary | ICD-10-CM | POA: Diagnosis not present

## 2024-08-24 DIAGNOSIS — E559 Vitamin D deficiency, unspecified: Secondary | ICD-10-CM | POA: Diagnosis not present

## 2024-08-24 DIAGNOSIS — E785 Hyperlipidemia, unspecified: Secondary | ICD-10-CM

## 2024-08-24 DIAGNOSIS — E1159 Type 2 diabetes mellitus with other circulatory complications: Secondary | ICD-10-CM

## 2024-08-24 DIAGNOSIS — R7989 Other specified abnormal findings of blood chemistry: Secondary | ICD-10-CM | POA: Diagnosis not present

## 2024-08-24 MED ORDER — TRIAMCINOLONE ACETONIDE 55 MCG/ACT NA AERO
2.0000 | INHALATION_SPRAY | Freq: Every day | NASAL | 12 refills | Status: AC | PRN
  Filled 2024-08-24: qty 16.9, 30d supply, fill #0

## 2024-08-24 MED ORDER — DOXYCYCLINE HYCLATE 100 MG PO TABS
100.0000 mg | ORAL_TABLET | Freq: Two times a day (BID) | ORAL | 0 refills | Status: AC
  Filled 2024-08-24: qty 20, 10d supply, fill #0

## 2024-08-24 MED ORDER — BENZONATATE 100 MG PO CAPS
100.0000 mg | ORAL_CAPSULE | Freq: Three times a day (TID) | ORAL | 2 refills | Status: AC | PRN
  Filled 2024-08-24: qty 60, 10d supply, fill #0

## 2024-08-24 NOTE — Patient Instructions (Addendum)
 Prevnar 20 after the Holidays  [facility] are [facility] clinics, M-F 9-4Sinus Infection, Adult A sinus infection, also called sinusitis, is inflammation of your sinuses. Sinuses are hollow spaces in the bones around your face. Your sinuses are located: Around your eyes. In the middle of your forehead. Behind your nose. In your cheekbones. Mucus normally drains out of your sinuses. When your nasal tissues become inflamed or swollen, mucus can become trapped or blocked. This allows bacteria, viruses, and fungi to grow, which leads to infection. Most infections of the sinuses are caused by a virus. A sinus infection can develop quickly. It can last for up to 4 weeks (acute) or for more than 12 weeks (chronic). A sinus infection often develops after a cold. What are the causes? This condition is caused by anything that creates swelling in the sinuses or stops mucus from draining. This includes: Allergies. Asthma. Infection from bacteria or viruses. Deformities or blockages in your nose or sinuses. Abnormal growths in the nose (nasal polyps). Pollutants, such as chemicals or irritants in the air. Infection from fungi. This is rare. What increases the risk? You are more likely to develop this condition if you: Have a weak body defense system (immune system). Do a lot of swimming or diving. Overuse nasal sprays. Smoke. What are the signs or symptoms? The main symptoms of this condition are pain and a feeling of pressure around the affected sinuses. Other symptoms include: Stuffy nose or congestion that makes it difficult to breathe through your nose. Thick yellow or greenish drainage from your nose. Tenderness, swelling, and warmth over the affected sinuses. A cough that may get worse at night. Decreased sense of smell and taste. Extra mucus that collects in the throat or the back of the nose (postnasal drip) causing a sore throat or bad breath. Tiredness  (fatigue). Fever. How is this diagnosed? This condition is diagnosed based on: Your symptoms. Your medical history. A physical exam. Tests to find out if your condition is acute or chronic. This may include: Checking your nose for nasal polyps. Viewing your sinuses using a device that has a light (endoscope). Testing for allergies or bacteria. Imaging tests, such as an MRI or CT scan. In rare cases, a bone biopsy may be done to rule out more serious types of fungal sinus disease. How is this treated? Treatment for a sinus infection depends on the cause and whether your condition is chronic or acute. If caused by a virus, your symptoms should go away on their own within 10 days. You may be given medicines to relieve symptoms. They include: Medicines that shrink swollen nasal passages (decongestants). A spray that eases inflammation of the nostrils (topical intranasal corticosteroids). Rinses that help get rid of thick mucus in your nose (nasal saline washes). Medicines that treat allergies (antihistamines). Over-the-counter pain relievers. If caused by bacteria, your health care provider may recommend waiting to see if your symptoms improve. Most bacterial infections will get better without antibiotic medicine. You may be given antibiotics if you have: A severe infection. A weak immune system. If caused by narrow nasal passages or nasal polyps, surgery may be needed. Follow these instructions at home: Medicines Take, use, or apply over-the-counter and prescription medicines only as told by your health care provider. These may include nasal sprays. If you were prescribed an antibiotic medicine, take it as told by your health care provider. Do not stop taking the antibiotic even if you start to feel better. Hydrate  and humidify  Drink enough fluid to keep your urine pale yellow. Staying hydrated will help to thin your mucus. Use a cool mist humidifier to keep the humidity level in your  home above 50%. Inhale steam for 10-15 minutes, 3-4 times a day, or as told by your health care provider. You can do this in the bathroom while a hot shower is running. Limit your exposure to cool or dry air. Rest Rest as much as possible. Sleep with your head raised (elevated). Make sure you get enough sleep each night. General instructions  Apply a warm, moist washcloth to your face 3-4 times a day or as told by your health care provider. This will help with discomfort. Use nasal saline washes as often as told by your health care provider. Wash your hands often with soap and water to reduce your exposure to germs. If soap and water are not available, use hand sanitizer. Do not smoke. Avoid being around people who are smoking (secondhand smoke). Keep all follow-up visits. This is important. Contact a health care provider if: You have a fever. Your symptoms get worse. Your symptoms do not improve within 10 days. Get help right away if: You have a severe headache. You have persistent vomiting. You have severe pain or swelling around your face or eyes. You have vision problems. You develop confusion. Your neck is stiff. You have trouble breathing. These symptoms may be an emergency. Get help right away. Call 911. Do not wait to see if the symptoms will go away. Do not drive yourself to the hospital. Summary A sinus infection is soreness and inflammation of your sinuses. Sinuses are hollow spaces in the bones around your face. This condition is caused by nasal tissues that become inflamed or swollen. The swelling traps or blocks the flow of mucus. This allows bacteria, viruses, and fungi to grow, which leads to infection. If you were prescribed an antibiotic medicine, take it as told by your health care provider. Do not stop taking the antibiotic even if you start to feel better. Keep all follow-up visits. This is important. This information is not intended to replace advice given to  you by your health care provider. Make sure you discuss any questions you have with your health care provider. Document Revised: 07/25/2021 Document Reviewed: 07/25/2021 Elsevier Patient Education  2024 [employer].

## 2024-08-31 NOTE — Progress Notes (Unsigned)
 "  SUBJECTIVE: Discussed the use of AI scribe software for clinical note transcription with the patient, who gave verbal consent to proceed.  Chief Complaint: Obesity  Interim History: She is down 4 lbs since her last visit.  Down 57 lbs overall TBW loss of 17.5%  Brandi Lester is here to discuss her progress with her obesity treatment plan. She is on the Category 4 Plan and states she is following her eating plan approximately 70 % of the time. She states she is exercising going to gym 30-45 minutes 3-4 times per week. Brandi Lester is a 58 year old female who presents for follow-up of her obesity treatment plan.  She is on a comprehensive medication regimen including atorvastatin  10 mg daily for hyperlipidemia, amlodipine  10 mg daily, losartan  100 mg daily, hydrochlorothiazide  12.5 mg daily, and carvedilol  25 mg BID for hypertension. For type 2 diabetes, she takes [name]  12.5 mg once weekly and metformin  500 mg once daily. She is also on bupropion  SR 200 mg daily for emotional eating and cravings, and cholecalciferol 4000 units daily for vitamin D  deficiency.  She follows a category four nutrition plan about 70% of the time, tracks her calories, and eats more whole foods, although she is not always meeting all her protein needs. She drinks adequate water, does not skip meals, and sleeps at least seven hours nightly. She exercises at the gym for 30 to 45 minutes, three to four times per week.  Recently, she experienced illness over the holiday period, which included symptoms such as coughing, sneezing, digestive issues. She attributes some weight loss during this period to being sick and taking antibiotics. No major issues with her current medication regimen, including no constipation or diarrhea, except for some digestive issues she attributes to acetaminophen , Mucinex, and cough drops.  She reports significant weight loss over her treatment period. She recalls a previous attempt to reduce  her Norvasc  dosage, which resulted in increased blood pressure, and mentions occasional swelling in her legs.  She is managing her weight loss journey with a goal of losing 24 to 36 more pounds over the next year. She acknowledges the challenge of maintaining consistency and avoiding weight fluctuations, and she aims to continue her progress by staying active and adhering to her nutrition plan.  OBJECTIVE: Visit Diagnoses: Problem List Items Addressed This Visit     Type 2 diabetes mellitus in patient with obesity (HCC) - Primary   Relevant Medications   tirzepatide  ([name] ) 12.5 MG/0.5ML Pen   Vitamin D  deficiency   Hypertension associated with type 2 diabetes mellitus (HCC)   Relevant Medications   tirzepatide  ([name] ) 12.5 MG/0.5ML Pen   BMI 40.0-44.9, adult (HCC) Current BMI 40.1   Relevant Medications   tirzepatide  ([name] ) 12.5 MG/0.5ML Pen   Obesity (HCC)- Start BMI 47.99   Relevant Medications   tirzepatide  ([name] ) 12.5 MG/0.5ML Pen   Other Visit Diagnoses       Nausea       Relevant Medications   ondansetron  (ZOFRAN -ODT) 4 MG disintegrating tablet     Obesity with emotional eating Significant weight loss of 57 pounds. Current BMI is 40. Visceral adipose rating decreased to 15, with a goal of 12 or less. Overall body adipose percentage decreased to 49%, with a goal of 35% or less. Current regimen includes [name]  12.5 mg weekly, which is well-tolerated. Bupropion  is effective for emotional eating and cravings. Exercise routine includes gym attendance 3-4 times per week. Discussed potential future medications with better weight loss outcomes,  possibly available in 2026. -Continue current Cat 4 nutrition plan - Continue/refill [name]  12.5 mg weekly. - Continue bupropion  SR 200 mg daily. No refill needed today - Encouraged consistent exercise routine. - Scheduled follow-up appointment for February 3rd at 3:30 PM.  Type 2 diabetes mellitus Managed with [name]   12.5 mg weekly and metformin  500 mg daily. No new symptoms reported.  Denies mass in neck, dysphagia, dyspepsia, persistent hoarseness, abdominal pain, or N/V/Constipation or diarrhea. Has annual eye exam. Mood is stable.  No GI side effects with metformin   On statin therapy On ARB Lab Results  Component Value Date   HGBA1C 5.5 06/01/2024   HGBA1C 5.6 03/09/2024   HGBA1C 5.3 12/16/2023   Lab Results  Component Value Date   MICROALBUR 4.0 (H) 02/20/2024   LDLCALC 64 06/01/2024   CREATININE 0.78 07/29/2024   She is working  on nutrition plan to decrease simple carbohydrates, increase lean proteins and exercise to promote weight loss and improve glycemic control . -Continue and refill [name]  12.5 mg weekly - Continue metformin  500 mg daily. Meds ordered this encounter  Medications   tirzepatide  ([name] ) 12.5 MG/0.5ML Pen    Sig: Inject 12.5 mg into the skin once a week.    Dispense:  2 mL    Refill:  1   ondansetron  (ZOFRAN -ODT) 4 MG disintegrating tablet    Sig: Dissolve 1 tablet (4 mg total) by mouth every 8 (eight) hours as needed for nausea or vomiting.    Dispense:  20 tablet    Refill:  0    Hypertension Blood pressure today is 111/71 mmHg. Previous attempts to reduce amlodipine  resulted in increased blood pressure. Cardiologist follow up is pending. No symptoms of hypotension. Working on adequate hydration.  BP Readings from Last 3 Encounters:  09/01/24 111/71  08/24/24 122/76  07/29/24 115/73   BP running a little lower and may be able to consider decreasing medication in near future- She does have cardiology follow up planned and will defer to cardiology.  Continue to work on nutrition plan to promote weight loss and improve BP control.  - Continue current antihypertensive regimen: amlodipine  10 mg daily, losartan  100 mg daily, hydrochlorothiazide  12.5 mg daily, carvedilol  25 mg BID. - Schedule appointment with cardiologist.  Eating disorder/emotional  eating Brandi Lester has had issues with stress/emotional eating. Currently this is moderately controlled. Overall mood is stable. Medication(s): Bupropion  SR 200 mg daily in am  Plan: Continue Bupropion  SR 200 mg daily in am- no refill needed this visit Continue to work on emotional eating behaviors   Vitamin D  deficiency Managed with cholecalciferol 4000 units daily. No new issues reported. Reports no N/V or muscle weakness with OTC  vitamin D .  .Last vitamin D  Lab Results  Component Value Date   VD25OH 69.5 07/29/2024  Low vitamin D  levels can be associated with adiposity and may result in leptin resistance and weight gain. Also associated with fatigue.  Currently on vitamin D  supplementation without any adverse effects such as nausea, vomiting or muscle weakness.  - Continue cholecalciferol 4000 units daily.  Nausea Intermittent nausea, possibly related to [name] . . Zofran  is used as needed for nausea without complications. - Continue/refill Zofran  as needed for nausea. Meds ordered this encounter  Medications   tirzepatide  ([name] ) 12.5 MG/0.5ML Pen    Sig: Inject 12.5 mg into the skin once a week.    Dispense:  2 mL    Refill:  1   ondansetron  (ZOFRAN -ODT) 4 MG disintegrating tablet  Sig: Dissolve 1 tablet (4 mg total) by mouth every 8 (eight) hours as needed for nausea or vomiting.    Dispense:  20 tablet    Refill:  0    Vitals Temp: 98.5 F (36.9 C) BP: 111/71 Pulse Rate: 89 SpO2: 98 %   Anthropometric Measurements Height: 5' 8 (1.727 m) Weight: 268 lb (121.6 kg) BMI (Calculated): 40.76 Weight at Last Visit: 272lb Weight Lost Since Last Visit: 4lb Weight Gained Since Last Visit: 0 Starting Weight: 325lb Total Weight Loss (lbs): 57 lb (25.9 kg) Peak Weight: 330lb   Body Composition  Body Fat %: 49 % Fat Mass (lbs): 131.4 lbs Muscle Mass (lbs): 129.8 lbs Total Body Water (lbs): 92.6 lbs Visceral Fat Rating : 15   Other Clinical Data Fasting:  no Labs: no Today's Visit #: 113 Starting Date: 05/23/17     ASSESSMENT AND PLAN:  Diet: Brandi Lester is currently in the action stage of change. As such, her goal is to continue with weight loss efforts. She has agreed to Category 4 Plan.  Exercise: Brandi Lester has been instructed to work up to a goal of 150 minutes of combined cardio and strengthening exercise per week for weight loss and overall health benefits.   Behavior Modification:  We discussed the following Behavioral Modification Strategies today: increasing lean protein intake, decreasing simple carbohydrates, increasing vegetables, increase H2O intake, increase high fiber foods, meal planning and cooking strategies, holiday eating strategies, avoiding temptations, and planning for success. We discussed various medication options to help Brandi Lester with her weight loss efforts and we both agreed to continue [name]  12.5 mg weekly and metformin  500 mg once daily for T2DM.  Return in about 5 weeks (around 10/06/2024).Brandi Lester She was informed of the importance of frequent follow up visits to maximize her success with intensive lifestyle modifications for her multiple health conditions.  Attestation Statements:   Reviewed by clinician on day of visit: allergies, medications, problem list, medical history, surgical history, family history, social history, and previous encounter notes.   Time spent on visit including pre-visit chart review and post-visit care and charting was 37 minutes.    Izaiah Tabb, PA-C  "

## 2024-09-01 ENCOUNTER — Encounter (INDEPENDENT_AMBULATORY_CARE_PROVIDER_SITE_OTHER): Payer: Self-pay | Admitting: Physician Assistant

## 2024-09-01 ENCOUNTER — Other Ambulatory Visit: Payer: Self-pay

## 2024-09-01 ENCOUNTER — Other Ambulatory Visit (HOSPITAL_BASED_OUTPATIENT_CLINIC_OR_DEPARTMENT_OTHER): Payer: Self-pay

## 2024-09-01 ENCOUNTER — Ambulatory Visit (INDEPENDENT_AMBULATORY_CARE_PROVIDER_SITE_OTHER): Admitting: Physician Assistant

## 2024-09-01 VITALS — BP 111/71 | HR 89 | Temp 98.5°F | Ht 68.0 in | Wt 268.0 lb

## 2024-09-01 DIAGNOSIS — I152 Hypertension secondary to endocrine disorders: Secondary | ICD-10-CM

## 2024-09-01 DIAGNOSIS — E119 Type 2 diabetes mellitus without complications: Secondary | ICD-10-CM | POA: Diagnosis not present

## 2024-09-01 DIAGNOSIS — Z6841 Body Mass Index (BMI) 40.0 and over, adult: Secondary | ICD-10-CM | POA: Diagnosis not present

## 2024-09-01 DIAGNOSIS — F3289 Other specified depressive episodes: Secondary | ICD-10-CM

## 2024-09-01 DIAGNOSIS — Z7985 Long-term (current) use of injectable non-insulin antidiabetic drugs: Secondary | ICD-10-CM

## 2024-09-01 DIAGNOSIS — R110 Nausea: Secondary | ICD-10-CM

## 2024-09-01 DIAGNOSIS — E1159 Type 2 diabetes mellitus with other circulatory complications: Secondary | ICD-10-CM

## 2024-09-01 DIAGNOSIS — I10 Essential (primary) hypertension: Secondary | ICD-10-CM | POA: Diagnosis not present

## 2024-09-01 DIAGNOSIS — E1169 Type 2 diabetes mellitus with other specified complication: Secondary | ICD-10-CM

## 2024-09-01 DIAGNOSIS — E559 Vitamin D deficiency, unspecified: Secondary | ICD-10-CM | POA: Diagnosis not present

## 2024-09-01 DIAGNOSIS — E669 Obesity, unspecified: Secondary | ICD-10-CM

## 2024-09-01 DIAGNOSIS — F5089 Other specified eating disorder: Secondary | ICD-10-CM | POA: Diagnosis not present

## 2024-09-01 MED ORDER — ONDANSETRON 4 MG PO TBDP
4.0000 mg | ORAL_TABLET | Freq: Three times a day (TID) | ORAL | 0 refills | Status: AC | PRN
  Filled 2024-09-01: qty 18, 6d supply, fill #0

## 2024-09-01 MED ORDER — TIRZEPATIDE 12.5 MG/0.5ML ~~LOC~~ SOAJ
12.5000 mg | SUBCUTANEOUS | 1 refills | Status: DC
  Filled 2024-09-01 – 2024-09-04 (×3): qty 2, 28d supply, fill #0

## 2024-09-01 NOTE — Telephone Encounter (Signed)
 Another PA came through even though a prior PA is on file. Submitted new PA and waiting on a response.,   Brandi Lester (Key: [id number])  Your demographic data has been sent to [employer] successfully!  [employer] typically takes up to one hour to respond, but it may take a little longer in some cases. You will be notified by email when available. You can also check for an update later by opening this request from your dashboard. Please do not fax or call [employer] to resubmit this request. If you need assistance, please chat with [employer] or call us  at [phone].  If it has been longer than 24 hours, please reach out to [employer].

## 2024-09-01 NOTE — Telephone Encounter (Signed)
 THE ADDITIONAL PA WAS NOT NEEDED. PREVIOUS PA IS GOOD FOR 3 YEARS AS NOTED BELOW

## 2024-09-04 ENCOUNTER — Other Ambulatory Visit (HOSPITAL_BASED_OUTPATIENT_CLINIC_OR_DEPARTMENT_OTHER): Payer: Self-pay

## 2024-09-18 ENCOUNTER — Other Ambulatory Visit: Payer: Self-pay | Admitting: Family Medicine

## 2024-09-18 ENCOUNTER — Other Ambulatory Visit: Payer: Self-pay

## 2024-09-18 ENCOUNTER — Other Ambulatory Visit (HOSPITAL_BASED_OUTPATIENT_CLINIC_OR_DEPARTMENT_OTHER): Payer: Self-pay

## 2024-09-18 DIAGNOSIS — I10 Essential (primary) hypertension: Secondary | ICD-10-CM

## 2024-09-18 DIAGNOSIS — T7840XA Allergy, unspecified, initial encounter: Secondary | ICD-10-CM

## 2024-09-18 DIAGNOSIS — E876 Hypokalemia: Secondary | ICD-10-CM

## 2024-09-18 MED ORDER — POTASSIUM CHLORIDE CRYS ER 20 MEQ PO TBCR
20.0000 meq | EXTENDED_RELEASE_TABLET | Freq: Three times a day (TID) | ORAL | 0 refills | Status: AC
  Filled 2024-09-18: qty 270, 90d supply, fill #0

## 2024-09-30 ENCOUNTER — Other Ambulatory Visit: Payer: Self-pay | Admitting: Family Medicine

## 2024-09-30 DIAGNOSIS — Z1231 Encounter for screening mammogram for malignant neoplasm of breast: Secondary | ICD-10-CM

## 2024-10-06 ENCOUNTER — Other Ambulatory Visit (HOSPITAL_BASED_OUTPATIENT_CLINIC_OR_DEPARTMENT_OTHER): Payer: Self-pay

## 2024-10-06 ENCOUNTER — Encounter (INDEPENDENT_AMBULATORY_CARE_PROVIDER_SITE_OTHER): Payer: Self-pay | Admitting: Physician Assistant

## 2024-10-06 ENCOUNTER — Other Ambulatory Visit: Payer: Self-pay

## 2024-10-06 ENCOUNTER — Telehealth (INDEPENDENT_AMBULATORY_CARE_PROVIDER_SITE_OTHER): Admitting: Physician Assistant

## 2024-10-06 VITALS — Ht 68.0 in | Wt 268.0 lb

## 2024-10-06 DIAGNOSIS — I10 Essential (primary) hypertension: Secondary | ICD-10-CM

## 2024-10-06 DIAGNOSIS — I152 Hypertension secondary to endocrine disorders: Secondary | ICD-10-CM

## 2024-10-06 DIAGNOSIS — Z7985 Long-term (current) use of injectable non-insulin antidiabetic drugs: Secondary | ICD-10-CM | POA: Diagnosis not present

## 2024-10-06 DIAGNOSIS — F3289 Other specified depressive episodes: Secondary | ICD-10-CM

## 2024-10-06 DIAGNOSIS — E785 Hyperlipidemia, unspecified: Secondary | ICD-10-CM | POA: Diagnosis not present

## 2024-10-06 DIAGNOSIS — E1169 Type 2 diabetes mellitus with other specified complication: Secondary | ICD-10-CM | POA: Diagnosis not present

## 2024-10-06 DIAGNOSIS — E669 Obesity, unspecified: Secondary | ICD-10-CM | POA: Diagnosis not present

## 2024-10-06 DIAGNOSIS — E1159 Type 2 diabetes mellitus with other circulatory complications: Secondary | ICD-10-CM | POA: Diagnosis not present

## 2024-10-06 DIAGNOSIS — E559 Vitamin D deficiency, unspecified: Secondary | ICD-10-CM

## 2024-10-06 DIAGNOSIS — Z7984 Long term (current) use of oral hypoglycemic drugs: Secondary | ICD-10-CM | POA: Diagnosis not present

## 2024-10-06 DIAGNOSIS — F5089 Other specified eating disorder: Secondary | ICD-10-CM

## 2024-10-06 DIAGNOSIS — E119 Type 2 diabetes mellitus without complications: Secondary | ICD-10-CM

## 2024-10-06 DIAGNOSIS — Z6841 Body Mass Index (BMI) 40.0 and over, adult: Secondary | ICD-10-CM

## 2024-10-06 MED ORDER — ATORVASTATIN CALCIUM 10 MG PO TABS
10.0000 mg | ORAL_TABLET | Freq: Every day | ORAL | 3 refills | Status: AC
  Filled 2024-10-06: qty 90, 90d supply, fill #0

## 2024-10-06 MED ORDER — BUPROPION HCL ER (SR) 200 MG PO TB12
200.0000 mg | ORAL_TABLET | Freq: Every morning | ORAL | 0 refills | Status: AC
  Filled 2024-10-06: qty 90, 90d supply, fill #0
  Filled ????-??-??: fill #0

## 2024-10-06 MED ORDER — TIRZEPATIDE 12.5 MG/0.5ML ~~LOC~~ SOAJ
12.5000 mg | SUBCUTANEOUS | 0 refills | Status: AC
  Filled 2024-10-06: qty 6, 84d supply, fill #0

## 2024-10-07 ENCOUNTER — Other Ambulatory Visit (HOSPITAL_BASED_OUTPATIENT_CLINIC_OR_DEPARTMENT_OTHER): Payer: Self-pay

## 2024-10-26 ENCOUNTER — Ambulatory Visit

## 2024-10-26 DIAGNOSIS — Z1231 Encounter for screening mammogram for malignant neoplasm of breast: Secondary | ICD-10-CM

## 2024-11-04 ENCOUNTER — Ambulatory Visit (INDEPENDENT_AMBULATORY_CARE_PROVIDER_SITE_OTHER): Admitting: Physician Assistant

## 2024-11-06 ENCOUNTER — Ambulatory Visit: Admitting: Cardiology

## 2025-01-26 ENCOUNTER — Encounter: Admitting: Student

## 2025-03-11 ENCOUNTER — Encounter: Admitting: Family Medicine
